# Patient Record
Sex: Female | Born: 1950 | Race: Black or African American | Hispanic: No | Smoking: Never smoker
Health system: Southern US, Community
[De-identification: ages and names within clinical notes are randomized; demographics above are authoritative.]

## PROBLEM LIST (undated history)

## (undated) DIAGNOSIS — I5032 Chronic diastolic (congestive) heart failure: Secondary | ICD-10-CM

## (undated) DIAGNOSIS — T7840XA Allergy, unspecified, initial encounter: Secondary | ICD-10-CM

## (undated) DIAGNOSIS — F32A Depression, unspecified: Secondary | ICD-10-CM

## (undated) DIAGNOSIS — I2489 Other forms of acute ischemic heart disease: Secondary | ICD-10-CM

## (undated) DIAGNOSIS — N183 Chronic kidney disease, stage 3 unspecified: Secondary | ICD-10-CM

## (undated) DIAGNOSIS — K219 Gastro-esophageal reflux disease without esophagitis: Secondary | ICD-10-CM

## (undated) DIAGNOSIS — I3139 Other pericardial effusion (noninflammatory): Secondary | ICD-10-CM

## (undated) DIAGNOSIS — R072 Precordial pain: Secondary | ICD-10-CM

## (undated) DIAGNOSIS — E119 Type 2 diabetes mellitus without complications: Secondary | ICD-10-CM

## (undated) DIAGNOSIS — Z8614 Personal history of Methicillin resistant Staphylococcus aureus infection: Secondary | ICD-10-CM

## (undated) DIAGNOSIS — N186 End stage renal disease: Secondary | ICD-10-CM

## (undated) DIAGNOSIS — R0902 Hypoxemia: Secondary | ICD-10-CM

## (undated) DIAGNOSIS — M1990 Unspecified osteoarthritis, unspecified site: Secondary | ICD-10-CM

## (undated) DIAGNOSIS — Z9289 Personal history of other medical treatment: Secondary | ICD-10-CM

## (undated) DIAGNOSIS — F419 Anxiety disorder, unspecified: Secondary | ICD-10-CM

## (undated) DIAGNOSIS — D649 Anemia, unspecified: Secondary | ICD-10-CM

## (undated) DIAGNOSIS — D329 Benign neoplasm of meninges, unspecified: Secondary | ICD-10-CM

## (undated) DIAGNOSIS — F329 Major depressive disorder, single episode, unspecified: Secondary | ICD-10-CM

## (undated) DIAGNOSIS — I10 Essential (primary) hypertension: Secondary | ICD-10-CM

## (undated) DIAGNOSIS — I248 Other forms of acute ischemic heart disease: Secondary | ICD-10-CM

## (undated) HISTORY — DX: Chronic diastolic (congestive) heart failure: I50.32

## (undated) HISTORY — DX: Precordial pain: R07.2

## (undated) HISTORY — DX: Allergy, unspecified, initial encounter: T78.40XA

## (undated) HISTORY — DX: Essential (primary) hypertension: I10

## (undated) HISTORY — DX: Hypoxemia: R09.02

## (undated) HISTORY — DX: Other forms of acute ischemic heart disease: I24.8

## (undated) HISTORY — DX: Other forms of acute ischemic heart disease: I24.89

## (undated) HISTORY — DX: Gastro-esophageal reflux disease without esophagitis: K21.9

## (undated) HISTORY — PX: JOINT REPLACEMENT: SHX530

## (undated) HISTORY — DX: Other pericardial effusion (noninflammatory): I31.39

## (undated) HISTORY — DX: Benign neoplasm of meninges, unspecified: D32.9

## (undated) HISTORY — DX: Anxiety disorder, unspecified: F41.9

## (undated) HISTORY — DX: Personal history of other medical treatment: Z92.89

## (undated) HISTORY — DX: End stage renal disease: N18.6

## (undated) HISTORY — PX: DILATION AND CURETTAGE OF UTERUS: SHX78

## (undated) HISTORY — PX: COLONOSCOPY: SHX174

---

## 1979-06-05 HISTORY — PX: TUBAL LIGATION: SHX77

## 1994-06-04 HISTORY — PX: CERVICAL DISCECTOMY: SHX98

## 1998-08-25 ENCOUNTER — Encounter: Payer: Self-pay | Admitting: Internal Medicine

## 2001-11-13 ENCOUNTER — Encounter: Payer: Self-pay | Admitting: Internal Medicine

## 2001-11-13 ENCOUNTER — Other Ambulatory Visit: Admission: RE | Admit: 2001-11-13 | Discharge: 2001-11-13 | Payer: Self-pay | Admitting: Internal Medicine

## 2001-11-13 LAB — CONVERTED CEMR LAB: Pap Smear: NORMAL

## 2003-03-01 ENCOUNTER — Encounter: Payer: Self-pay | Admitting: Internal Medicine

## 2004-08-07 ENCOUNTER — Ambulatory Visit: Payer: Self-pay | Admitting: Internal Medicine

## 2004-10-05 ENCOUNTER — Ambulatory Visit: Payer: Self-pay | Admitting: Internal Medicine

## 2005-05-18 ENCOUNTER — Ambulatory Visit: Payer: Self-pay | Admitting: Internal Medicine

## 2005-06-04 HISTORY — PX: OTHER SURGICAL HISTORY: SHX169

## 2005-07-13 ENCOUNTER — Ambulatory Visit: Payer: Self-pay | Admitting: Internal Medicine

## 2005-08-06 ENCOUNTER — Ambulatory Visit: Payer: Self-pay | Admitting: Internal Medicine

## 2005-08-06 ENCOUNTER — Other Ambulatory Visit: Admission: RE | Admit: 2005-08-06 | Discharge: 2005-08-06 | Payer: Self-pay | Admitting: Internal Medicine

## 2005-09-06 ENCOUNTER — Ambulatory Visit: Payer: Self-pay | Admitting: Internal Medicine

## 2005-10-05 ENCOUNTER — Ambulatory Visit: Payer: Self-pay | Admitting: Internal Medicine

## 2006-02-28 ENCOUNTER — Ambulatory Visit: Payer: Self-pay | Admitting: Family Medicine

## 2006-04-27 ENCOUNTER — Other Ambulatory Visit: Payer: Self-pay

## 2006-04-27 ENCOUNTER — Emergency Department: Payer: Self-pay | Admitting: Emergency Medicine

## 2006-05-08 ENCOUNTER — Ambulatory Visit: Payer: Self-pay | Admitting: Internal Medicine

## 2006-05-14 ENCOUNTER — Ambulatory Visit: Payer: Self-pay | Admitting: Internal Medicine

## 2006-05-20 ENCOUNTER — Ambulatory Visit: Payer: Self-pay | Admitting: Internal Medicine

## 2006-05-20 ENCOUNTER — Encounter: Payer: Self-pay | Admitting: Internal Medicine

## 2006-06-04 DIAGNOSIS — Z9289 Personal history of other medical treatment: Secondary | ICD-10-CM

## 2006-06-04 DIAGNOSIS — Z8614 Personal history of Methicillin resistant Staphylococcus aureus infection: Secondary | ICD-10-CM

## 2006-06-04 HISTORY — DX: Personal history of Methicillin resistant Staphylococcus aureus infection: Z86.14

## 2006-06-04 HISTORY — DX: Personal history of other medical treatment: Z92.89

## 2006-06-21 ENCOUNTER — Encounter: Payer: Self-pay | Admitting: Internal Medicine

## 2006-07-18 ENCOUNTER — Ambulatory Visit: Payer: Self-pay | Admitting: Internal Medicine

## 2006-07-19 ENCOUNTER — Encounter: Payer: Self-pay | Admitting: Internal Medicine

## 2006-07-19 LAB — CONVERTED CEMR LAB
Bilirubin Urine: NEGATIVE
Hemoglobin, Urine: NEGATIVE
Ketones, ur: NEGATIVE mg/dL
Nitrite: NEGATIVE
Specific Gravity, Urine: 1.021 (ref 1.005–1.03)
Urine Glucose: NEGATIVE mg/dL
Urobilinogen, UA: 0.2 (ref 0.0–1.0)

## 2006-08-03 DIAGNOSIS — Z86018 Personal history of other benign neoplasm: Secondary | ICD-10-CM

## 2006-08-03 HISTORY — PX: BRAIN MENINGIOMA EXCISION: SHX576

## 2006-08-27 ENCOUNTER — Ambulatory Visit: Payer: Self-pay | Admitting: Physical Medicine & Rehabilitation

## 2006-08-27 ENCOUNTER — Inpatient Hospital Stay (HOSPITAL_COMMUNITY)
Admission: RE | Admit: 2006-08-27 | Discharge: 2006-09-10 | Payer: Self-pay | Admitting: Physical Medicine & Rehabilitation

## 2006-09-10 ENCOUNTER — Encounter: Payer: Self-pay | Admitting: Internal Medicine

## 2006-09-26 ENCOUNTER — Encounter: Payer: Self-pay | Admitting: Internal Medicine

## 2006-09-26 DIAGNOSIS — E7800 Pure hypercholesterolemia, unspecified: Secondary | ICD-10-CM

## 2006-09-26 DIAGNOSIS — I10 Essential (primary) hypertension: Secondary | ICD-10-CM | POA: Insufficient documentation

## 2006-09-26 DIAGNOSIS — J309 Allergic rhinitis, unspecified: Secondary | ICD-10-CM | POA: Insufficient documentation

## 2006-09-27 DIAGNOSIS — G43009 Migraine without aura, not intractable, without status migrainosus: Secondary | ICD-10-CM | POA: Insufficient documentation

## 2006-09-30 ENCOUNTER — Ambulatory Visit: Payer: Self-pay | Admitting: Internal Medicine

## 2006-10-01 ENCOUNTER — Encounter: Payer: Self-pay | Admitting: Internal Medicine

## 2006-10-04 ENCOUNTER — Encounter: Payer: Self-pay | Admitting: Internal Medicine

## 2006-12-30 ENCOUNTER — Ambulatory Visit: Payer: Self-pay | Admitting: Internal Medicine

## 2006-12-31 ENCOUNTER — Encounter: Payer: Self-pay | Admitting: Internal Medicine

## 2007-01-06 ENCOUNTER — Telehealth (INDEPENDENT_AMBULATORY_CARE_PROVIDER_SITE_OTHER): Payer: Self-pay | Admitting: *Deleted

## 2007-01-09 ENCOUNTER — Encounter: Payer: Self-pay | Admitting: Internal Medicine

## 2007-01-17 ENCOUNTER — Telehealth (INDEPENDENT_AMBULATORY_CARE_PROVIDER_SITE_OTHER): Payer: Self-pay | Admitting: *Deleted

## 2007-04-11 ENCOUNTER — Ambulatory Visit: Payer: Self-pay | Admitting: Internal Medicine

## 2007-04-11 DIAGNOSIS — R0609 Other forms of dyspnea: Secondary | ICD-10-CM | POA: Insufficient documentation

## 2007-04-11 DIAGNOSIS — R0989 Other specified symptoms and signs involving the circulatory and respiratory systems: Secondary | ICD-10-CM

## 2007-04-30 ENCOUNTER — Encounter (INDEPENDENT_AMBULATORY_CARE_PROVIDER_SITE_OTHER): Payer: Self-pay | Admitting: *Deleted

## 2007-05-06 ENCOUNTER — Ambulatory Visit: Payer: Self-pay | Admitting: Internal Medicine

## 2007-05-06 LAB — CONVERTED CEMR LAB
BUN: 11 mg/dL (ref 6–23)
CO2: 28 meq/L (ref 19–32)
Calcium: 9.5 mg/dL (ref 8.4–10.5)
Chloride: 103 meq/L (ref 96–112)
Creatinine, Ser: 0.8 mg/dL (ref 0.4–1.2)
GFR calc Af Amer: 95 mL/min
Potassium: 4 meq/L (ref 3.5–5.1)
Sodium: 142 meq/L (ref 135–145)

## 2007-05-07 ENCOUNTER — Encounter: Payer: Self-pay | Admitting: Internal Medicine

## 2007-05-13 ENCOUNTER — Encounter: Admission: RE | Admit: 2007-05-13 | Discharge: 2007-05-13 | Payer: Self-pay | Admitting: Neurosurgery

## 2007-06-18 ENCOUNTER — Ambulatory Visit: Payer: Self-pay | Admitting: Cardiology

## 2007-06-27 ENCOUNTER — Ambulatory Visit: Payer: Self-pay

## 2007-06-27 ENCOUNTER — Encounter: Payer: Self-pay | Admitting: Internal Medicine

## 2007-06-27 ENCOUNTER — Encounter: Payer: Self-pay | Admitting: Cardiology

## 2007-06-27 LAB — CONVERTED CEMR LAB
CO2: 30 meq/L (ref 19–32)
Calcium: 9.2 mg/dL (ref 8.4–10.5)
Creatinine, Ser: 1.8 mg/dL — ABNORMAL HIGH (ref 0.4–1.2)
TSH: 4.5 microintl units/mL (ref 0.35–5.50)

## 2007-08-18 ENCOUNTER — Ambulatory Visit: Payer: Self-pay | Admitting: Internal Medicine

## 2007-08-19 LAB — CONVERTED CEMR LAB
Albumin: 3.9 g/dL (ref 3.5–5.2)
BUN: 15 mg/dL (ref 6–23)
CO2: 32 meq/L (ref 19–32)
Calcium: 9.6 mg/dL (ref 8.4–10.5)
Creatinine, Ser: 0.9 mg/dL (ref 0.4–1.2)
Phosphorus: 4.5 mg/dL (ref 2.3–4.6)
Potassium: 3.9 meq/L (ref 3.5–5.1)
Sodium: 141 meq/L (ref 135–145)

## 2007-11-10 ENCOUNTER — Ambulatory Visit: Payer: Self-pay | Admitting: Internal Medicine

## 2008-02-27 ENCOUNTER — Ambulatory Visit: Payer: Self-pay | Admitting: Internal Medicine

## 2008-05-03 ENCOUNTER — Telehealth (INDEPENDENT_AMBULATORY_CARE_PROVIDER_SITE_OTHER): Payer: Self-pay | Admitting: *Deleted

## 2008-05-04 ENCOUNTER — Telehealth: Payer: Self-pay | Admitting: Internal Medicine

## 2008-07-16 ENCOUNTER — Telehealth: Payer: Self-pay | Admitting: Internal Medicine

## 2008-07-19 ENCOUNTER — Encounter: Payer: Self-pay | Admitting: Internal Medicine

## 2008-07-21 ENCOUNTER — Encounter: Payer: Self-pay | Admitting: Internal Medicine

## 2008-07-22 ENCOUNTER — Encounter: Payer: Self-pay | Admitting: Internal Medicine

## 2008-08-27 ENCOUNTER — Ambulatory Visit: Payer: Self-pay | Admitting: Internal Medicine

## 2008-08-30 LAB — CONVERTED CEMR LAB
ALT: 16 units/L (ref 0–35)
AST: 17 units/L (ref 0–37)
Albumin: 3.9 g/dL (ref 3.5–5.2)
Basophils Relative: 0.3 % (ref 0.0–3.0)
Calcium: 9.2 mg/dL (ref 8.4–10.5)
Cholesterol: 259 mg/dL — ABNORMAL HIGH (ref 0–200)
Creatinine, Ser: 0.6 mg/dL (ref 0.4–1.2)
Direct LDL: 189.4 mg/dL
Eosinophils Relative: 0.9 % (ref 0.0–5.0)
GFR calc non Af Amer: 131.39 mL/min (ref >60)
Glucose, Bld: 135 mg/dL — ABNORMAL HIGH (ref 70–99)
Lymphocytes Relative: 30.2 % (ref 12.0–46.0)
MCHC: 33.9 g/dL (ref 30.0–36.0)
Monocytes Absolute: 0.6 10*3/uL (ref 0.1–1.0)
Monocytes Relative: 6.6 % (ref 3.0–12.0)
Neutrophils Relative %: 62 % (ref 43.0–77.0)
Platelets: 290 10*3/uL (ref 150.0–400.0)
Potassium: 3.7 meq/L (ref 3.5–5.1)
RBC: 4.68 M/uL (ref 3.87–5.11)
Sodium: 143 meq/L (ref 135–145)
TSH: 1.22 microintl units/mL (ref 0.35–5.50)

## 2009-01-10 ENCOUNTER — Ambulatory Visit: Payer: Self-pay | Admitting: Internal Medicine

## 2009-03-07 ENCOUNTER — Ambulatory Visit: Payer: Self-pay | Admitting: Internal Medicine

## 2009-03-19 ENCOUNTER — Emergency Department: Payer: Self-pay | Admitting: Emergency Medicine

## 2009-03-29 ENCOUNTER — Ambulatory Visit: Payer: Self-pay | Admitting: Internal Medicine

## 2009-03-29 DIAGNOSIS — S2090XA Unspecified superficial injury of unspecified parts of thorax, initial encounter: Secondary | ICD-10-CM

## 2009-06-04 DIAGNOSIS — E119 Type 2 diabetes mellitus without complications: Secondary | ICD-10-CM

## 2009-06-04 HISTORY — DX: Type 2 diabetes mellitus without complications: E11.9

## 2009-07-15 ENCOUNTER — Ambulatory Visit: Payer: Self-pay | Admitting: Internal Medicine

## 2009-07-15 ENCOUNTER — Other Ambulatory Visit: Admission: RE | Admit: 2009-07-15 | Discharge: 2009-07-15 | Payer: Self-pay | Admitting: Internal Medicine

## 2009-07-16 LAB — CONVERTED CEMR LAB
Alkaline Phosphatase: 95 units/L (ref 39–117)
BUN: 15 mg/dL (ref 6–23)
Basophils Absolute: 0.1 10*3/uL (ref 0.0–0.1)
CO2: 29 meq/L (ref 19–32)
Calcium: 9.2 mg/dL (ref 8.4–10.5)
Chloride: 98 meq/L (ref 96–112)
Cholesterol: 259 mg/dL — ABNORMAL HIGH (ref 0–200)
Creatinine, Ser: 0.8 mg/dL (ref 0.4–1.2)
Direct LDL: 197 mg/dL
GFR calc non Af Amer: 93.99 mL/min (ref >60)
Glucose, Bld: 149 mg/dL — ABNORMAL HIGH (ref 70–99)
Monocytes Absolute: 0.5 10*3/uL (ref 0.1–1.0)
Monocytes Relative: 6.2 % (ref 3.0–12.0)
Neutro Abs: 4.6 10*3/uL (ref 1.4–7.7)
Phosphorus: 3.3 mg/dL (ref 2.3–4.6)
Potassium: 3.3 meq/L — ABNORMAL LOW (ref 3.5–5.1)
Sodium: 138 meq/L (ref 135–145)
TSH: 1.35 microintl units/mL (ref 0.35–5.50)
Triglycerides: 116 mg/dL (ref 0.0–149.0)
WBC: 7.5 10*3/uL (ref 4.5–10.5)

## 2009-07-19 LAB — CONVERTED CEMR LAB: Pap Smear: NEGATIVE

## 2009-08-10 ENCOUNTER — Encounter: Payer: Self-pay | Admitting: Internal Medicine

## 2009-08-10 ENCOUNTER — Ambulatory Visit: Payer: Self-pay | Admitting: Internal Medicine

## 2009-08-11 ENCOUNTER — Ambulatory Visit: Payer: Self-pay | Admitting: Internal Medicine

## 2009-08-11 ENCOUNTER — Telehealth: Payer: Self-pay | Admitting: Internal Medicine

## 2009-08-12 ENCOUNTER — Ambulatory Visit: Payer: Self-pay | Admitting: Family Medicine

## 2009-08-12 ENCOUNTER — Encounter: Payer: Self-pay | Admitting: Internal Medicine

## 2009-08-12 DIAGNOSIS — N950 Postmenopausal bleeding: Secondary | ICD-10-CM

## 2009-08-12 LAB — HM MAMMOGRAPHY: HM Mammogram: NORMAL

## 2009-08-16 ENCOUNTER — Encounter (INDEPENDENT_AMBULATORY_CARE_PROVIDER_SITE_OTHER): Payer: Self-pay | Admitting: *Deleted

## 2009-08-22 ENCOUNTER — Ambulatory Visit: Payer: Self-pay | Admitting: Internal Medicine

## 2009-08-22 DIAGNOSIS — R7309 Other abnormal glucose: Secondary | ICD-10-CM | POA: Insufficient documentation

## 2009-08-23 ENCOUNTER — Telehealth: Payer: Self-pay | Admitting: Internal Medicine

## 2009-08-23 LAB — CONVERTED CEMR LAB
ALT: 17 units/L (ref 0–35)
Albumin: 3.9 g/dL (ref 3.5–5.2)
BUN: 17 mg/dL (ref 6–23)
Bilirubin, Direct: 0 mg/dL (ref 0.0–0.3)
CO2: 32 meq/L (ref 19–32)
Calcium: 9.1 mg/dL (ref 8.4–10.5)
Chloride: 104 meq/L (ref 96–112)
Cholesterol: 194 mg/dL (ref 0–200)
LDL Cholesterol: 123 mg/dL — ABNORMAL HIGH (ref 0–99)
Phosphorus: 3.4 mg/dL (ref 2.3–4.6)
Potassium: 4.3 meq/L (ref 3.5–5.1)
Sodium: 142 meq/L (ref 135–145)
Total CHOL/HDL Ratio: 4
Total Protein: 8.2 g/dL (ref 6.0–8.3)

## 2009-09-06 ENCOUNTER — Ambulatory Visit: Payer: Self-pay

## 2009-09-12 ENCOUNTER — Ambulatory Visit: Payer: Self-pay

## 2009-09-14 ENCOUNTER — Encounter: Payer: Self-pay | Admitting: Internal Medicine

## 2009-09-14 ENCOUNTER — Telehealth (INDEPENDENT_AMBULATORY_CARE_PROVIDER_SITE_OTHER): Payer: Self-pay | Admitting: *Deleted

## 2009-09-14 ENCOUNTER — Ambulatory Visit: Payer: Self-pay | Admitting: Cardiology

## 2009-09-22 ENCOUNTER — Ambulatory Visit: Payer: Self-pay

## 2009-10-14 ENCOUNTER — Telehealth: Payer: Self-pay | Admitting: Internal Medicine

## 2009-10-24 ENCOUNTER — Telehealth: Payer: Self-pay | Admitting: Internal Medicine

## 2010-02-22 ENCOUNTER — Ambulatory Visit: Payer: Self-pay | Admitting: Internal Medicine

## 2010-03-20 ENCOUNTER — Ambulatory Visit: Payer: Self-pay | Admitting: Internal Medicine

## 2010-03-20 DIAGNOSIS — J019 Acute sinusitis, unspecified: Secondary | ICD-10-CM

## 2010-03-20 DIAGNOSIS — H6090 Unspecified otitis externa, unspecified ear: Secondary | ICD-10-CM | POA: Insufficient documentation

## 2010-03-20 DIAGNOSIS — H60339 Swimmer's ear, unspecified ear: Secondary | ICD-10-CM

## 2010-03-21 ENCOUNTER — Telehealth: Payer: Self-pay | Admitting: Family Medicine

## 2010-07-02 LAB — CONVERTED CEMR LAB
Albumin: 3.7 g/dL (ref 3.5–5.2)
BUN: 12 mg/dL (ref 6–23)
Calcium: 9.4 mg/dL (ref 8.4–10.5)
Chloride: 102 meq/L (ref 96–112)
Creatinine, Ser: 0.6 mg/dL (ref 0.4–1.2)
Eosinophils Absolute: 0 10*3/uL (ref 0.0–0.6)
Glucose, Bld: 100 mg/dL — ABNORMAL HIGH (ref 70–99)
HCT: 38 % (ref 36.0–46.0)
MCHC: 33.5 g/dL (ref 30.0–36.0)
MCV: 84.2 fL (ref 78.0–100.0)
Microalb, Ur: 2 mg/dL — ABNORMAL HIGH (ref 0.0–1.9)
Monocytes Absolute: 0.5 10*3/uL (ref 0.2–0.7)
Monocytes Relative: 8.5 % (ref 3.0–11.0)
Neutrophils Relative %: 56.4 % (ref 43.0–77.0)
Phosphorus: 4 mg/dL (ref 2.3–4.6)
Potassium: 3.7 meq/L (ref 3.5–5.1)
Prothrombin Time: 12 s (ref 10.0–14.0)
RDW: 14.9 % — ABNORMAL HIGH (ref 11.5–14.6)
Sed Rate: 38 mm/hr — ABNORMAL HIGH (ref 0–25)

## 2010-07-06 NOTE — Letter (Signed)
Summary: Surgical Clearance/[facility]   Imported By: Edmonia James 09/29/2009 13:25:11  _____________________________________________________________________  External Attachment:    Type:   Image     Comment:   External Document

## 2010-07-06 NOTE — Assessment & Plan Note (Signed)
Summary: [name]   Nurse Visit   Allergies: 1)  Septra 2)  Aleve (Naproxen Sodium)  Orders Added: 1)  Flu Vaccine 26yrs + MEDICARE PATIENTS [Q2039] 2)  Administration Flu vaccine - MCR [id number]                      Flu Vaccine Consent Questions     Do you have a history of severe allergic reactions to this vaccine? no    Any prior history of allergic reactions to egg and/or gelatin? no    Do you have a sensitivity to the preservative Thimersol? no    Do you have a past history of Guillan-Barre Syndrome? no    Do you currently have an acute febrile illness? no    Have you ever had a severe reaction to latex? no    Vaccine information given and explained to patient? yes    Are you currently pregnant? no    Lot Number:[id number]   Exp Date:12/02/2010   Manufacturer: [employer]    Site Given  Left Deltoid IMlu

## 2010-07-06 NOTE — Assessment & Plan Note (Signed)
Summary: CONGESTON/DLO   Vital Signs:  Patient profile:   60 year old female Weight:      306.50 pounds Temp:     98.9 degrees F oral Pulse rate:   88 / minute Pulse rhythm:   regular BP sitting:   140 / 80  (left arm) Cuff size:   large  Vitals Entered By: Maudie Mercury Dance CMA Deborra Medina) (March 20, 2010 3:21 PM) CC: Bilat. ear pain and sore throat   History of Present Illness: CC: earache, ST  1 1/2 wk h/o earach L>R, scratchy throat, more hoarse.  mild dry cough.  + clear mucous rhinorrhea.  + tooth pain/pressure.  + mild ringing last night R ear.    No change in hearing or discharge/draining from ears.  No fevers/chills, abd pain, n/v/d, HA.  No recent swimming.    + husband with sinus infection last month.  + grandchildren sick a few weeks ago.  s/p flu shot.  Current Medications (verified): 1)  Keppra 500 Mg Tabs (Levetiracetam) .... Take 2 By Mouth Two Times A Day 2)  Norvasc 10 Mg Tabs (Amlodipine Besylate) .... Take One By Mouth Once Daily 3)  Multivitamins  Tabs (Multiple Vitamin) .... Take One By Mouth Once Daily 4)  Ibuprofen 600 Mg Tabs (Ibuprofen) .... Take One By Mouth Two Times A Day 5)  Pravastatin Sodium 40 Mg Tabs (Pravastatin Sodium) .[name].. 1 Tab Daily For High Cholesterol 6)  Triamterene-Hctz 75-50 Mg Tabs (Triamterene-Hctz) .... 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  morbidly obese Head:  Normocephalic and atraumatic without obvious abnormalities. No apparent alopecia or balding.  + sinus  tenderness to palpation throughout mainly L frontal Eyes:  pupils equal, pupils round, pupils reactive to light Ears:  L external ear canal with some inflammation/swelling, difficult to evaluate TM but seen and dull, slight erythema. R TM - dull, fluid, good mobility with insufflation Nose:  no external deformity.  swollen turbinates Mouth:  + erythema pharynx/telangiectasia Neck:  supple, no masses, no thyromegaly, no carotid bruits, and no cervical lymphadenopathy.   Lungs:  normal respiratory effort.   Heart:  normal rate, regular rhythm, no murmur, and no gallop.   Pulses:  2+ rad pulses, good skin turgor Extremities:  no edema   Impression & Recommendations:  Problem # 1:  SINUSITIS, ACUTE (ICD-461.9) treat for early sinusitis with abx.  red flags to return discussed.    Her updated medication list for this problem includes:    Amoxicillin 875 Mg Tabs (Amoxicillin) ..... One by mouth two times a day x 10 days  Problem # 2:  OTITIS EXTERNA, ACUTE (ICD-380.12) given significant swelling on exam today, treat with ear drops x 5 days.  RTC if not improving.  no drainage.  visualized TM, no perf.  Her updated medication list for this problem includes:    Cipro Hc 0.2-1 % Susp (Ciprofloxacin-hydrocortisone) ..... One drop in affected ear two times a day x 5 days  Complete Medication List: 1)  Keppra 500 Mg Tabs (Levetiracetam) .... Take 2 by  mouth two times a day 2)  Norvasc 10 Mg Tabs (Amlodipine besylate) .... Take one by mouth once daily 3)  Multivitamins Tabs (Multiple vitamin) .... Take one by mouth once daily 4)  Ibuprofen 600 Mg Tabs (Ibuprofen) .... Take one by mouth two times a day 5)  Pravastatin Sodium 40 Mg Tabs (Pravastatin sodium) .[name].. 1 tab daily for high cholesterol 6)  Triamterene-hctz 75-50 Mg Tabs (Triamterene-hctz) .... 1/2 tab daily for high blood pressure 7)  Amoxicillin 875 Mg Tabs (Amoxicillin) .... One by mouth two times a day x 10 days 8)  Cipro Hc 0.2-1 %  Susp (Ciprofloxacin-hydrocortisone) .... One drop in affected ear two times a day x 5 days  Patient Instructions: 1)  You may have early sinus infection. Treat with course of antibiotics for next 10 days.  Ear drops for external infection for next few days as well. 2)  Tylenol for pain. 3)  Please return if not improving as expected, if high fevers >101.5, or other concerns.   4)  Push fluids and get plenty of rest to help speed recovery. 5)  Pleasure to see you otday, call clinic with questions. Prescriptions: CIPRO HC 0.2-1 % SUSP (CIPROFLOXACIN-HYDROCORTISONE) one drop in affected ear two times a day x 5 days  #1 x 0   Entered and Authorized by:   Ria Bush  MD   Signed by:   Ria Bush  MD on 03/20/2010   Method used:   Electronically to        [facility] (retail)       [place], [place]  [address]       Ph: [phone]       Fax: [phone]   RxID:   [id number] AMOXICILLIN 875 MG TABS (AMOXICILLIN) one by mouth two times a day x 10 days  #20 x 0   Entered and Authorized by:   Ria Bush  MD   Signed by:   Ria Bush  MD on 03/20/2010   Method used:   Electronically to        [facility] (retail)       [place], [place]  [address]       Ph: [phone]       Fax: [phone]   RxID:   [id number]    Orders Added: 1)  Est. Patient Level III [id number]    Current Allergies (reviewed today): SEPTRA ALEVE (NAPROXEN SODIUM)

## 2010-07-06 NOTE — Assessment & Plan Note (Signed)
Summary: Abnormal vaginal bleeding   Vital Signs:  Patient profile:   60 year old female Height:      65 inches Weight:      317.50 pounds BMI:     53.03 Temp:     97.3 degrees F oral Pulse rate:   76 / minute Pulse rhythm:   regular BP sitting:   136 / 72  (left arm) Cuff size:   large  Vitals Entered By: Sherrian Divers CMA Deborra Medina) (August 12, 2009 11:19 AM) CC: abnormal vaginal bleeding   History of Present Illness: 60 year old female:  Came in a couple of weeks ago and had some pap smear and then Tuesday Monday, and bled and was bleeding like she was having a cycle. Has been bleeding ever since. She is not had periods in greater than 10 years.  No prior vaginal bleeding  in this time.  She has no known history of gynecological cancers, she's never had any  gynecological surgery.. She retains her uterus and ovaries.   Dr. Laurey Morale delivered her children.   Allergies: 1)  Septra (Sulfamethoxazole-Trimethoprim) 2)  Aleve (Naproxen Sodium)  Past History:  Past medical, surgical, family and social histories (including risk factors) reviewed, and no changes noted (except as noted below).  Past Medical History: Reviewed history from 08/27/2008 and no changes required. Allergic rhinitis Hypertension Foramen magnum meningioma  Past Surgical History: Reviewed history from 09/26/2006 and no changes required. Cervical discectomy (Kritzer) 1996 C-section 1981 Right wrist x-ray- deg. at 1st. 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 HTN is strong in family  Social History: Reviewed history from 08/18/2007 and no changes required. 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 GI: No n/v/d Pulm: No SOB, cough, wheezing Interactive and getting along well at home.  Otherwise, ROS is as per the HPI.   Physical Exam  General:  alert and normal appearance.  morbidly obese Head:  Normocephalic and atraumatic without obvious abnormalities. No apparent alopecia or balding. Ears:  no external deformities.   Nose:  no external deformity.   Lungs:  normal respiratory effort.   Abdomen:  soft, non-tender, normal bowel sounds, no distention, no masses, no guarding, no rigidity, and no rebound tenderness.   Genitalia:  normal introitus, no external lesions, and no vaginal or cervical lesions.   there is blood and clot in the vaginal vault and at the  cervical os Neurologic:  alert & oriented X3 and gait normal.   Inguinal Nodes:  No significant adenopathy Psych:  Cognition and judgment appear intact. Alert and cooperative with normal attention span and concentration. No apparent delusions, illusions, hallucinations   Impression & Recommendations:  Problem # 1:  POSTMENOPAUSAL BLEEDING (ICD-627.1) Assessment New postmenopausal bleeding, 60 years old, obligated for definitive  evaluation..  Consult Dr. Laurey Morale, the patient's gynecologist  Orders: Gynecologic Referral (Gyn)  Complete Medication List: 1)  Keppra 500 Mg Tabs (Levetiracetam) .... Take 2 by mouth two times a day 2)  Norvasc 10 Mg Tabs (Amlodipine besylate) .... Take one by mouth once daily 3)  Multivitamins Tabs (Multiple vitamin) .... Take one by mouth once daily 4)  Ibuprofen 600 Mg Tabs (Ibuprofen) .... Take one  by mouth two times a day 5)  Triamterene-hctz 37.5-25 Mg Tabs (Triamterene-hctz) .[name].. 1 tab daily for high blood pressure 6)  Pravastatin Sodium 40 Mg Tabs (Pravastatin sodium) .[name].. 1 tab daily for high cholesterol  Patient Instructions: 1)  Referral Appointment Information 2)  Day/Date: 3)  Time: 4)  Place/MD: 5)  Address: 6)  Phone/Fax: 7)  Patient given  appointment information. Information/Orders faxed/mailed.   Current Allergies (reviewed today): SEPTRA (SULFAMETHOXAZOLE-TRIMETHOPRIM) ALEVE (NAPROXEN SODIUM)

## 2010-07-06 NOTE — Progress Notes (Signed)
  Phone Note Other Incoming   Caller: [facility] Summary of Call: Patient is being charged the 5.27 for not returning the ifob stool kit. This note is also to inform you that the test has been canceled.  Initial call taken by: Selinda Orion,  August 11, 2009 11:07 AM  Follow-up for Phone Call        noted Follow-up by: [name] MD,  August 11, 2009 1:23 PM

## 2010-07-06 NOTE — Progress Notes (Signed)
Summary: Maxzide  Phone Note From Pharmacy   Caller: [facility].* Call For: Dr. Silvio Pate  Summary of Call: This looks like you may have addressed this on May 13th but I received the fax again today and I want to be sure of your instructions.  The fax states that the Maxzide 25 or 50 mg. is no longer available.  I read your comment from the last phone note that the patient had been advised to take 1/2 of the double strength but I do not see a record of that Rx.  Please advise. Initial call taken by: Christena Deem CMA Deborra Medina),  Oct 24, 2009 9:51 AM  Follow-up for Phone Call        the order was sent Not sure whether they had this or not Please check into this Follow-up by: [name] MD,  Oct 24, 2009 10:03 AM  Additional Follow-up for Phone Call Additional follow up Details #1::        spoke with pharmacist, they did receive rx and will take refill out of que. Additional Follow-up by: Edwin Dada CMA Deborra Medina),  Oct 24, 2009 11:17 AM

## 2010-07-06 NOTE — Letter (Signed)
Summary: [facility]: Immunoassay Fecal Occult Blood (iFOB) Order Form  [place] at [facility]  [address] [place], [place] [address]   Phone: [phone]  Fax: [phone]      [facility]: Immunoassay Fecal Occult Blood (iFOB) Order Form   July 15, 2009 MRN: [id number]   Carrie [name] 1950/10/02   Physicican Name:______Letvak___________________  Diagnosis Code:_______V76.11___________________      [name] MD

## 2010-07-06 NOTE — Letter (Signed)
Summary: Results Follow up Letter  [place] at [facility]  [address] [place], [place] [address]   Phone: [phone]  Fax: [phone]    08/12/2009 MRN: [id number]  Carrie [name] [place],   [address]  Dear Ms. [name],  The following are the results of your recent test(s):  Test         Result    Pap Smear:        Normal _____  Not Normal _____ Comments: ______________________________________________________ Cholesterol: LDL(Bad cholesterol):         Your goal is less than:         HDL (Good cholesterol):       Your goal is more than: Comments:  ______________________________________________________ Mammogram:        Normal __X___  Not Normal _____ Comments: Repeat in 1-2 years  ___________________________________________________________________ Hemoccult:        Normal _____  Not normal _______ Comments:    _____________________________________________________________________ Other Tests:    We routinely do not discuss normal results over the telephone.  If you desire a copy of the results, or you have any questions about this information we can discuss them at your next office visit.   Sincerely,     Viviana Simpler, MD

## 2010-07-06 NOTE — Letter (Signed)
Summary: [facility] No Show Letter  [place] at [facility]  [address] [place], [place] [address]   Phone: [phone]  Fax: [phone]    08/16/2009 MRN: [id number]  Carrie [name] [place], [place]  [address]   Dear Ms. [name],   [facility] records indicate that you missed your scheduled appointment with _____lab________________ on ___3.15.11_________.  Please contact this office to reschedule your appointment as soon as possible.  It is important that you keep your scheduled appointments with your physician, so we can provide you the best care possible.  Please be advised that there may be a charge for "no show" appointments.    Sincerely,   [place] at [facility]

## 2010-07-06 NOTE — Assessment & Plan Note (Signed)
Summary: CPX/RBH   Vital Signs:  Patient profile:   60 year old female Weight:      313 pounds BMI:     52.27 Temp:     98.2 degrees F oral Pulse rate:   88 / minute Pulse rhythm:   regular BP sitting:   140 / 88  (left arm) Cuff size:   large  Vitals Entered By: Edwin Dada CMA Deborra Medina) (July 15, 2009 9:42 AM) CC: adult physical   History of Present Illness: Here for physical No concerns for her  3 years since her foramen magnum tumor Surgeon moved to [place] but has deferred follow up to local neurosurgeon recent MRI fine    Allergies: 1)  Septra (Sulfamethoxazole-Trimethoprim) 2)  Aleve (Naproxen Sodium)  Past History:  Past medical, surgical, family and social histories (including risk factors) reviewed for relevance to current acute and chronic problems.  Past Medical History: Reviewed history from 08/27/2008 and no changes required. Allergic rhinitis Hypertension Foramen magnum meningioma  Past Surgical History: Reviewed history from 09/26/2006 and no changes required. Cervical discectomy (Kritzer) 1996 C-section 1981 Right wrist x-ray- deg. at 1st. 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 HTN is strong in family  Social History: Reviewed history from 08/18/2007 and no changes required. 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 Eyes:  Denies double vision and vision loss-1 eye. ENT:  Complains of ringing in ears; denies decreased hearing; intermittent ringing teeth okay--overdue for dentist. CV:  Complains of shortness of breath with exertion; denies chest pain or discomfort,  difficulty breathing at night, difficulty breathing while lying down, fainting, lightheadness, and palpitations; stable DOE. Resp:  Denies cough and shortness of breath. GI:  Denies abdominal pain, bloody stools, change in bowel habits, dark tarry stools, indigestion, nausea, and vomiting. GU:  Complains of nocturia; denies dysuria and incontinence; no sexual problems. MS:  Complains of joint pain; denies joint swelling; chronic knee pain. Derm:  Denies lesion(s) and rash. Neuro:  Complains of disturbances in coordination and poor balance; denies headaches, numbness, tingling, and weakness; stable walking difficulty. Psych:  Denies anxiety and depression. Heme:  Denies abnormal bruising and enlarge lymph nodes. Allergy:  Complains of seasonal allergies and sneezing; no meds.  Physical Exam  General:  alert and normal appearance.   Eyes:  pupils equal, pupils round, pupils reactive to light, and no optic disk abnormalities.   Ears:  R ear normal and L ear normal.   Mouth:  no erythema and no lesions.   Neck:  supple, no masses, no thyromegaly, no carotid bruits, and no cervical lymphadenopathy.   Breasts:  no masses, no abnormal thickening, no tenderness, and no adenopathy.   Lungs:  normal respiratory effort and normal breath sounds.   Heart:  normal rate, regular rhythm, no murmur, and no gallop.   Abdomen:  soft and non-tender.   Genitalia:  very limited trouble visualizing cervix--pap done limited bimanual without abnormality Msk:  no joint tenderness and no joint swelling.   Pulses:  normal in feet Extremities:  no edema Neurologic:  alert & oriented X3.   Needs assist getting on table Skin:  no rashes and no suspicious lesions.   Psych:  normally  interactive, good eye contact, not anxious appearing, and not depressed appearing.     Impression & Recommendations:  Problem # 1:  PREVENTIVE HEALTH CARE (ICD-V70.0) Assessment Comment Only will set up mammo decided to do stool  immunoassay pap done discussed fitness  Problem # 2:  HYPERTENSION (ICD-401.9) Assessment: Unchanged  reasonable control no changes for now  Her updated medication list for this problem includes:    Norvasc 10 Mg Tabs (Amlodipine besylate) .[name]... Take one by mouth once daily    Hydrochlorothiazide 25 Mg Tabs (Hydrochlorothiazide) .[name]... Take 1 tablet by mouth once a day  BP today: 140/88 Prior BP: 122/70 (03/29/2009)  Prior 10 Yr Risk Heart Disease: Not enough information (09/30/2006)  Labs Reviewed: K+: 3.7 (08/27/2008) Creat: : 0.6 (08/27/2008)   Chol: 259 (08/27/2008)   HDL: 38.90 (08/27/2008)   TG: 125.0 (08/27/2008)  Orders: TLB-Renal Function Panel (80069-RENAL) TLB-CBC Platelet - w/Differential (85025-CBCD) TLB-TSH (Thyroid Stimulating Hormone) (84443-TSH) Venipuncture [id number])  Problem # 3:  HYPERCHOLESTEROLEMIA (ICD-272.0) Assessment: Unchanged  will recheck consider meds if LDL still >130  Labs Reviewed: SGOT: 17 (08/27/2008)   SGPT: 16 (08/27/2008)  Prior 10 Yr Risk Heart Disease: Not enough information (09/30/2006)   HDL:38.90 (08/27/2008)  Chol:259 (08/27/2008)  Trig:125.0 (08/27/2008)  Orders: TLB-Lipid Panel (80061-LIPID) TLB-Hepatic/Liver Function Pnl (80076-HEPATIC)  Complete Medication List: 1)  Keppra 500 Mg Tabs (Levetiracetam) .... Take 2 by mouth two times a day 2)  Norvasc 10 Mg Tabs (Amlodipine besylate) .... Take one by mouth once daily 3)  Multivitamins Tabs (Multiple vitamin) .... Take one by mouth once daily 4)  Ibuprofen 600 Mg Tabs (Ibuprofen) .... Take one by mouth two times a day 5)  Hydrochlorothiazide 25 Mg Tabs (Hydrochlorothiazide) .... Take 1 tablet by mouth once a day  Other Orders: Radiology Referral (Radiology)  Patient Instructions: 1)  Please schedule a follow-up appointment in 6 months .  2)  Schedule your mammogram.  3)  Complete your hemoccult cards and return them soon.   Current Allergies (reviewed today): SEPTRA  (SULFAMETHOXAZOLE-TRIMETHOPRIM) ALEVE (NAPROXEN SODIUM)

## 2010-07-06 NOTE — Progress Notes (Signed)
Summary: Surgery  Phone Note Outgoing Call   Call placed by: Edwin Dada CMA Deborra Medina),  August 23, 2009 9:13 AM Call placed to: Patient Summary of Call: Called pt about her lab results and pt advised she will be having surgery on 09/08/2009. Pt was referred to Dr. Laurey Morale at [facility] and she will be having a D & C, pt also states she will go back on 08/31/2009 for pre-op, can we send copy of labs to [facility]? per pt? Initial call taken by: Edwin Dada CMA Deborra Medina),  August 23, 2009 9:17 AM  Follow-up for Phone Call        okay to send labs and my last note to Dr Laurey Morale  Tell her I wish her well Have her call with an update after her surgery Follow-up by: [name] MD,  August 23, 2009 9:30 AM  Additional Follow-up for Phone Call Additional follow up Details #1::        labs sent to Dr. Laurey Morale, pt states she will call.  Additional Follow-up by: Edwin Dada CMA Deborra Medina),  August 23, 2009 10:03 AM

## 2010-07-06 NOTE — Progress Notes (Signed)
Summary: Clearance for surgery  Phone Note From Other Clinic   Caller: [facility] Dr. Laurey Morale Call For: Dr. Silvio Pate Summary of Call: patient is scheduled to have a hysteroscopy D&C on 09/22/2009 and she needs surgical clearance. [facility] was told by patient that she just had a physical in Feb so they are faxing over the form to see if she needs to be seen? [facility] states there is a difference in her EKG from 2008 to 2011. Please advise. Form on your desk. Initial call taken by: Edwin Dada CMA Deborra Medina),  September 14, 2009 9:26 AM  Follow-up for Phone Call        EKG is not really different than 2008 form done that it should be okay to proceed Follow-up by: [name] MD,  September 14, 2009 12:24 PM  Additional Follow-up for Phone Call Additional follow up Details #1::        Form faxed to [facility]. Additional Follow-up by: Virgia Land,  September 14, 2009 1:12 PM

## 2010-07-06 NOTE — Progress Notes (Signed)
Summary: Request cheaper rx  Phone Note Call from Patient Call back at [phone]   Caller: Patient Call For: Dr.Cleaven Demario Summary of Call: Patient was in yesterday and given a rx for an ear drop. Patient states that the cost is $85.00 and that is with her insurance. Patient is requesting that you call her something cheaper in. Pharmacy-[facility]. Initial call taken by: Emelia Salisbury LPN,  October 18, 624THL 8:38 AM  Follow-up for Phone Call        changed.  please notify patient.  should be about $30, only necessary if still with ear pain. Follow-up by: Ria Bush  MD,  March 21, 2010 2:04 PM  Additional Follow-up for Phone Call Additional follow up Details #1::        Patient notified Additional Follow-up by: Arnetha Courser CMA Deborra Medina),  March 21, 2010 2:25 PM    New/Updated Medications: NEOMYCIN-POLYMYXIN-HC 3.5-10000-1 SUSP (NEOMYCIN-POLYMYXIN-HC) 4 drops into ear 3 times a day x 5 days Prescriptions: NEOMYCIN-POLYMYXIN-HC 3.5-10000-1 SUSP (NEOMYCIN-POLYMYXIN-HC) 4 drops into ear 3 times a day x 5 days  #1 x 0   Entered and Authorized by:   Ria Bush  MD   Signed by:   Ria Bush  MD on 03/21/2010   Method used:   Electronically to        [facility] (retail)       [place], [place]  [address]       Ph: [phone]       Fax: [phone]   RxID:   [id number]

## 2010-07-06 NOTE — Progress Notes (Signed)
Summary: TRIAMTERENE-HCTZ  Phone Note Refill Request Message from:  [facility] on Oct 14, 2009 11:04 AM  Refills Requested: Medication #1:  TRIAMTERENE-HCTZ 37.5-25 MG TABS 1 tab daily for high blood pressure Form on your desk stating that the strength of med is no longer made, please advise what this needs to be changed to.   Method Requested: Electronic Initial call taken by: Edwin Dada CMA Deborra Medina),  Oct 14, 2009 11:05 AM  Follow-up for Phone Call        let her know I sent double dose and she can just take 1/2  Follow-up by: [name] MD,  Oct 14, 2009 1:41 PM  Additional Follow-up for Phone Call Additional follow up Details #1::        Spoke with patient and advised results.  Additional Follow-up by: Edwin Dada CMA Deborra Medina),  Oct 14, 2009 3:25 PM    New/Updated Medications: TRIAMTERENE-HCTZ 75-50 MG TABS (TRIAMTERENE-HCTZ) 1/2 tab daily for high blood pressure Prescriptions: TRIAMTERENE-HCTZ 75-50 MG TABS (TRIAMTERENE-HCTZ) 1/2 tab daily for high blood pressure  #30 x 12   Entered and Authorized by:   [name] MD   Signed by:   [name] MD on 10/14/2009   Method used:   Electronically to        [facility] (retail)       [place], [place]  [id number]       Ph: [phone]       Fax: [phone]   RxID:   [id number]

## 2010-10-10 ENCOUNTER — Other Ambulatory Visit: Payer: Self-pay | Admitting: Internal Medicine

## 2010-10-17 NOTE — Assessment & Plan Note (Signed)
[facility]                            CARDIOLOGY OFFICE NOTE   NAME:Carrie [name], Carrie [name]                     MRN:          [id number]  DATE:06/18/2007                            DOB:          [birth year]    PRIMARY CARE PHYSICIAN:  Venia Carbon, MD   REASON FOR REFERRAL:  Shortness of breath.   CLINICAL HISTORY:  Carrie [name] is 60 years old and is known to me since  Carrie [name] husband is a patient of mine.  She has been having shortness of  breath over the past year and she recently saw Dr. Silvio Pate for evaluation  of this.  She says this dated back to after she had surgery for a tumor  and Carrie [name] cervical spine removed.  She has since that time she has gained  some weight and she had been less active, but now that she had become  more active she notes that she is quite short of breath.  She notes this  when she climbs stairs or walks up a hill.  She has no associated chest  pain and chest tightness and has had no palpitations.  Dr. Silvio Pate  thought this was probably related to weight gain and deconditioning, but  had some concern about this being related to cardiac etiology and  referred Carrie [name] here.   PAST MEDICAL HISTORY:  1. Is significant for hypertension.  2. She has had a borderline cholesterol.  3. She had no history of diabetes.  4. Carrie [name] past history is also history for Carrie [name] surgery at the base of Carrie [name]      skull which was performed in [place] at the recommendation of Dr.      Bernadene Person.  5. That she also has had a C-section.   CURRENT MEDICATIONS:  1. Keppra which was started after a surgery as prophylactic against      seizures.  2. She also on Norvasc for blood pressure.  3. Multivitamin.  4. Ibuprofen.   FAMILY HISTORY:  Is negative for heart disease or stroke.   REVIEW OF SYSTEMS:  Is positive for symptoms of fatigue and joint pains.   PHYSICAL EXAMINATION:  On examination today the blood pressure was  155/91, pulse 97 and regular.  There  was no venous tension.  Carotid pulses were full and no bruits.  The chest was clear without rales or rhonchi.  HEART:  Rhythm is regular.  No murmurs or gallops.  ABDOMEN:  Was soft with normal bowel sounds.  The abdomen was  protuberant.  I could feel no hepatosplenomegaly.  There was a 1+ edema bilaterally.  Pedal pulses were equal.   Electrocardiogram was normal.   ALLERGIES:  SEPTRA AND ALEVE.   IMPRESSION:  1. Shortness of breath of uncertain etiology.  2. Hypertension, not under optimal control.  3. Status post recent surgery for a tumor on the cervical spine and      base of the skull.   RECOMMENDATIONS:  I think the most probable explanation for Carrie [name]'s  shortness of breath is related to weight gain and deconditioning, but I  think we  should rule out cardiac etiology.  She has no smoking history  or lung history.  I will plan to get adenosine rest stress Myoview scan,  2D echocardiogram and chest x-ray.  Also get a CBC, BMP, BNP and TSH if  she has not had these done by Dr. Silvio Pate recently.  Will also start  hydrochlorothiazide 25 a day for Carrie [name] blood pressure and for lower  extremity edema, which I think is related to venous insufficiency.  I  will be in touch with Carrie [name] by phone after we have results of all of Carrie [name]  tests and will decide if she should have any further evaluation.     Bruce Alfonso Patten Olevia Perches, MD, [facility]  Electronically Signed    BRB/MedQ  DD: 06/18/2007  DT: 06/18/2007  Job #: [id number]

## 2010-10-20 NOTE — Discharge Summary (Signed)
NAME:  Carrie [name], Carrie [name] NO.:  [id number]   MEDICAL RECORD NO.:  [id number]          PATIENT TYPE:  IPS   LOCATION:  [id number]                         FACILITY:  [facility]   PHYSICIAN:  Jarvis Morgan, M.D.   DATE OF BIRTH:  21-Nov-1950   DATE OF ADMISSION:  08/27/2006  DATE OF DISCHARGE:  09/10/2006                               DISCHARGE SUMMARY   DISCHARGE DIAGNOSIS:  1. Foramina magnum meningioma resection.  2. Postop cerebrospinal fluid leak and methicillin-resistant      Staphylococcus aureus infection of neck treated.  3. Acute renal insufficiency resolving.  4. Candida urinary tract infection treated.  5. Hypokalemia resolved.  6. Hypertension.   HISTORY OF PRESENT ILLNESS:  Carrie [name] is a 60 year-old female with  history of hypertension and ACDF in past who developed recent neck pain  with numbness, bilateral upper extremity, around Thanksgiving of 2007.  Evaluation revealed large foramen magnum lesion with mass effect on mid  brain on right.  She was referred to Dr. Girtha Hake in [place] and  elected to undergo transcondylar course for microscopic resection of  extradural meningioma on February 21.  Postop with small right medullary  infarct and dysphasia.  The patient also developed mental status  changes; and fevers secondary to CSF leak and meningitis requiring  treatment with IV vancomycin, cefepime and Flagyl. ID was consulted for  input and recommended continuing antibiotics until CSF cultures  finalized.  CSF lumbar drain was placed on March 6.  Cultures from neck  wound positive for MRSA, CSF without any growth.  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  The patient  currently maintained on Keppra for seizure prophylaxis.  Has had issues  with dysphasia that have resolved;  currently on D-3 thin liquids.  Zosyn  was discontinued on March 22 with an ID recommending IV vancomycin  through March 31.  Currently patient with impairment in mobility and  self-care; and rehab consulted for further therapies.   PAST MEDICAL HISTORY:  Significant for hypertension in ACDF in 1996.   ALLERGIES:  SULFA.   FAMILY HISTORY:  Significant for cancer with brain metastases and heart  problems.   SOCIAL HISTORY:  The patient lives in [place] and works in a day  care.  Lives in a two-level home with 3-5 steps at entry.  She was  independent and working prior to admission.  Does not use any tobacco or  alcohol.   HOSPITAL COURSE:  Carrie [name] was admitted to rehab on August 27, 2006 for inpatient therapies to consist of PT, OT, and speech therapy.  Past admission she was maintained on DT diet thin liquids and was able  to tolerate this without difficulty.  Stat labs done past admission  secondary to issues regarding hypokalemia that was reported; and the  patient was started on KCl 14 mEq initially.  Labs of March 25 revealed  some renal insufficiency with  BUN at 1.7.  Vancomycin level was noted be  high at 51.4.  The patient's renal status has been followed along,  secondary to treatment with vancomycin.  The patient's vancomycin levels  have slowly come down with the last vancomycin of  March 29 at 21.6.  She has not required a vancomycin dose during this stay secondary to  high vancomycin levels.  The vancomycin was discontinued on March 31.  The patient's renal status was on March 28 showed BUN at 25, creatinine  2.95.  Daily labs have been done; and as the vancomycin levels in her  system have normalized out, renal status was much improved overall.  Last check of labs from April 7 reveals sodium 137, potassium 4.1,  chloride 109, CO2 25, BUN 19, creatinine 1.7, a glucose of 95.  A UA UC  was done past admission and grew out 10,000 colonies of yeast; and the   patient was treated with Diflucan x3 days for this.   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  She has had no  issues regarding headaches.  She did have complaints of right shoulder  pain secondary to rotator cuff tendonitis and was started on ibuprofen  600 mg p.o. t.i.d. with improvement.  She declined a steroid injection  of this joint.  The patient's p.o. intake has been good.  She has been  continent of bowel and bladder.  Left posterior cranial incision has  healed; and edema of the site has resolved.  During her stay in rehab  Carrie [name] has done well.  Currently she has progressed along in self-  care; requiring some assist for right upper extremity range of motion  the.  At time of discharge the patient was independent for sitting-  standing balance.  She was moderate-independent for standing balance  with the rolling walker.  She was able to ambulate 150 feet with close  supervision with safety concerns due to occasional loss of balance due  to visual deficits.  Speech therapy evaluation showed basic high-level  comprehension to be intact.  Verbal expression intact without aphasia or  dysarthria.  She was able to be read large print and write her name  without difficulty.  Attention and awareness was intact with the  patient's working memory intact for organization, problem solving and  reasoning.  The patient will continue to receive further follow up from  home health, PT, and OT past discharge.  She is advised to followup with  neuro-ophthalmology in 4-6 weeks; if vision changes do not gradually  improve.  On September 10, 2006 the patient is discharged to home.   DISCHARGE MEDICATIONS:  1. Keppra 500 mg two p.o. b.i.d.  2. Ferrous sulfate 325 mg a day.  3. Norvasc 10 mg a day.  4. Multivitamin one per day.  5. Ibuprofen 600 mg b.i.d.   DIET:  Regular   ACTIVITY LEVEL:  24-hour supervision, no strenuous activity.  SPECIAL INSTRUCTIONS:  Do not use  lisinopril.  [facility] to  provide PT, OT.   FOLLOWUP:  1. The patient to follow up with Dr. Silvio Pate, on April 16 at 11:00 a.m.  2. Followup with Dr. Hal Neer for routine check in the next few weeks.  3. Followup with Dr. Frederico Hamman neuro-ophthalmology in 4-6 weeks for      repeat evaluation.  4. Followup with Dr. Girtha Hake in 3 months for schedule.      Thornton Dales, P.A.    ______________________________  Jarvis Morgan,  M.D.    PP/MEDQ  D:  09/11/2006  T:  09/11/2006  Job:  [id number]   cc:   Venia Carbon, MD  Faythe Ghee, M.D.

## 2010-11-14 ENCOUNTER — Other Ambulatory Visit: Payer: Self-pay | Admitting: Internal Medicine

## 2011-02-07 ENCOUNTER — Encounter: Payer: Self-pay | Admitting: Internal Medicine

## 2011-02-08 ENCOUNTER — Ambulatory Visit (INDEPENDENT_AMBULATORY_CARE_PROVIDER_SITE_OTHER): Payer: Medicare Other | Admitting: Internal Medicine

## 2011-02-08 ENCOUNTER — Encounter: Payer: Self-pay | Admitting: Internal Medicine

## 2011-02-08 VITALS — BP 125/64 | HR 94 | Temp 98.1°F | Wt 295.5 lb

## 2011-02-08 DIAGNOSIS — E119 Type 2 diabetes mellitus without complications: Secondary | ICD-10-CM

## 2011-02-08 DIAGNOSIS — E1129 Type 2 diabetes mellitus with other diabetic kidney complication: Secondary | ICD-10-CM | POA: Insufficient documentation

## 2011-02-08 DIAGNOSIS — I10 Essential (primary) hypertension: Secondary | ICD-10-CM

## 2011-02-08 DIAGNOSIS — E7800 Pure hypercholesterolemia, unspecified: Secondary | ICD-10-CM

## 2011-02-08 NOTE — Assessment & Plan Note (Signed)
No problems with the meds Will check labs

## 2011-02-08 NOTE — Progress Notes (Signed)
Subjective:    Patient ID: Carrie [name], female    DOB: [birth year], 60 y.o.   MRN: [id number]  HPI Doing "wonderful" Trying to eat better---reducing bread and healthier snacks Husband cooks for his diabetes and they eat together Tries to walk a little---like at store  Hasn't checked her sugars Discussed using husband's machine  No chest pain No SOB No sig edema---except if prolonged standing No palpitations  Still on pravastatin No GI problems or myalgias  Current Outpatient Prescriptions on File Prior to Visit  Medication Sig Dispense Refill  . amLODipine (NORVASC) 10 MG tablet TAKE ONE TABLET BY MOUTH EVERY DAY  90 tablet  3  . ibuprofen (ADVIL,MOTRIN) 600 MG tablet Take 600 mg by mouth 2 (two) times daily.        [name] levETIRAcetam (KEPPRA) 500 MG tablet Take 500 mg by mouth 2 (two) times daily.        . Multiple Vitamin (MULTIVITAMIN) tablet Take 1 tablet by mouth daily.        . pravastatin (PRAVACHOL) 40 MG tablet Take 40 mg by mouth daily.        [name] triamterene-hydrochlorothiazide (MAXZIDE) 75-50 MG per tablet TAKE ONE-HALF TABLET BY MOUTH EVERY DAY FOR BLOOD PRESSURE  90 tablet  3    Allergies  Allergen Reactions  . Naproxen Sodium     REACTION: unknown  . Sulfamethoxazole W/Trimethoprim     REACTION: Unknown rxn    Past Medical History  Diagnosis Date  . Allergy   . Hypertension   . Meningioma     Foramen magnum  . Diabetes mellitus 2011    Past Surgical History  Procedure Date  . Cesarean section 1981  . Cervical discectomy 1996    Dr. Alric Seton  . Right wrist x-ray 2007    Deg. at 1st MCP  . Brain stem tumor 03/08    Foramina magnum meningioma    Family History  Problem Relation Age of Onset  . Hypertension Other     Strong family history     History   Social History  . Marital Status: Married    Spouse Name: N/A    Number of Children: 1  . Years of Education: N/A   Occupational History  . 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  Disabled since brain surgery    Social History Main Topics  . Smoking status: Never Smoker   . Smokeless tobacco: Not on file  . Alcohol Use: No  . Drug Use: Not on file  . Sexually Active: Not on file   Other Topics Concern  . Not on file   Social History Narrative   Lives in family home with extended family.   Review of Systems No neuro problems like headache. Hasn't had any follow up occ stress and nerves but not prolonged Sleeping is not great---uses [employer] sleep aide occ    Objective:   Physical Exam  Constitutional: She appears well-developed and well-nourished. No distress.  Neck: Normal range of motion. Neck supple. No thyromegaly present.  Cardiovascular: Normal rate, regular rhythm, normal heart sounds and intact distal pulses.  Exam reveals no gallop.   No murmur heard. Pulmonary/Chest: Effort normal and breath sounds normal. No respiratory distress. She has no wheezes. She has no rales.  Musculoskeletal: Normal range of motion. She exhibits no edema and no tenderness.  Lymphadenopathy:    She has no cervical adenopathy.  Neurological:       Normal sensation in feet  Skin:  Skin is warm. No rash noted.  Psychiatric: She has a normal mood and affect. Her behavior is normal. Judgment and thought content normal.          Assessment & Plan:

## 2011-02-08 NOTE — Assessment & Plan Note (Signed)
New diagnosis for her still Will check labs Probably needs to start metformin

## 2011-02-08 NOTE — Assessment & Plan Note (Signed)
BP Readings from Last 3 Encounters:  02/08/11 125/64  03/20/10 140/80  08/12/09 136/72   Good control  No changes  Due for labs

## 2011-02-09 ENCOUNTER — Other Ambulatory Visit: Payer: Medicare Other

## 2011-02-09 ENCOUNTER — Ambulatory Visit: Payer: Self-pay | Admitting: Internal Medicine

## 2011-02-09 LAB — BASIC METABOLIC PANEL
BUN: 21 mg/dL (ref 6–23)
GFR: 65.43 mL/min (ref >60.00)
Potassium: 3.8 mEq/L (ref 3.5–5.1)
Sodium: 138 mEq/L (ref 135–145)

## 2011-02-09 LAB — HEPATIC FUNCTION PANEL
AST: 17 U/L (ref 0–37)
Albumin: 3.9 g/dL (ref 3.5–5.2)
Alkaline Phosphatase: 94 U/L (ref 39–117)

## 2011-02-09 LAB — CBC WITH DIFFERENTIAL/PLATELET
Basophils Absolute: 0 10*3/uL (ref 0.0–0.1)
Basophils Relative: 0.3 % (ref 0.0–3.0)
Eosinophils Absolute: 0.1 10*3/uL (ref 0.0–0.7)
Eosinophils Relative: 1.1 % (ref 0.0–5.0)
MCHC: 32.7 g/dL (ref 30.0–36.0)
MCV: 86 fl (ref 78.0–100.0)
Monocytes Absolute: 0.6 10*3/uL (ref 0.1–1.0)
Monocytes Relative: 6.5 % (ref 3.0–12.0)
Platelets: 284 10*3/uL (ref 150.0–400.0)

## 2011-02-09 LAB — LIPID PANEL
Cholesterol: 194 mg/dL (ref 0–200)
HDL: 42.7 mg/dL (ref >39.00)
Triglycerides: 112 mg/dL (ref 0.0–149.0)
VLDL: 22.4 mg/dL (ref 0.0–40.0)

## 2011-02-15 ENCOUNTER — Telehealth: Payer: Self-pay | Admitting: *Deleted

## 2011-02-15 ENCOUNTER — Encounter: Payer: Self-pay | Admitting: *Deleted

## 2011-02-15 MED ORDER — METFORMIN HCL 500 MG PO TABS: 500.0000 mg | ORAL_TABLET | Freq: Two times a day (BID) | ORAL | Status: DC

## 2011-02-15 NOTE — Telephone Encounter (Signed)
Spoke with patient and advised results rx sent to pharmacy by e-script  

## 2011-02-15 NOTE — Telephone Encounter (Signed)
.  left message to have patient return my call. letter mailed to patients home address with results.

## 2011-02-15 NOTE — Telephone Encounter (Signed)
Message copied by Despina Hidden on Thu Feb 15, 2011  7:59 AM ------      Message from: Viviana Simpler I      Created: Sun Feb 11, 2011  1:27 PM       Please call      Diabetes is actually okay with HgbA1c of 7.3% but it would still be best to start metformin 500mg  bid (1 year Rx). We will recheck labs at her next appt      CHol is pretty good with total of 194 and LDL or bad chol of 129. Continue the pravastatin      Blood count, liver, kidney and thyroid are all normal

## 2011-03-09 ENCOUNTER — Ambulatory Visit (INDEPENDENT_AMBULATORY_CARE_PROVIDER_SITE_OTHER): Payer: Medicare Other

## 2011-03-09 DIAGNOSIS — Z23 Encounter for immunization: Secondary | ICD-10-CM

## 2011-04-15 ENCOUNTER — Other Ambulatory Visit: Payer: Self-pay | Admitting: Internal Medicine

## 2011-05-11 ENCOUNTER — Ambulatory Visit (INDEPENDENT_AMBULATORY_CARE_PROVIDER_SITE_OTHER): Payer: Medicare Other | Admitting: Internal Medicine

## 2011-05-11 ENCOUNTER — Encounter: Payer: Self-pay | Admitting: Internal Medicine

## 2011-05-11 VITALS — BP 130/75 | HR 97 | Temp 98.8°F | Ht 65.0 in | Wt 293.0 lb

## 2011-05-11 DIAGNOSIS — J019 Acute sinusitis, unspecified: Secondary | ICD-10-CM

## 2011-05-11 MED ORDER — AMOXICILLIN 500 MG PO TABS: 1000.0000 mg | ORAL_TABLET | Freq: Two times a day (BID) | ORAL | Status: AC

## 2011-05-11 NOTE — Progress Notes (Signed)
  Subjective:    Patient ID: Carrie [name], female    DOB: 08-31-1950, 60 y.o.   MRN: [id number]  HPI Has been sick for 4 days Many coughing spells Clear nasal rhinorrhea Feels "exhaused" Barely could get out of bed No fever Some SOB  Throat was briefly scratchy Head is congested with some frontal headache Ear pain as well  Tylenol PM may have helped some. Used husband's cough syrup--helped a bit  Current Outpatient Prescriptions on File Prior to Visit  Medication Sig Dispense Refill  . amLODipine (NORVASC) 10 MG tablet TAKE ONE TABLET BY MOUTH EVERY DAY  90 tablet  3  . ibuprofen (ADVIL,MOTRIN) 600 MG tablet Take 600 mg by mouth 2 (two) times daily.        [name] levETIRAcetam (KEPPRA) 500 MG tablet TAKE TWO TABLETS BY MOUTH TWICE DAILY  120 tablet  6  . metFORMIN (GLUCOPHAGE) 500 MG tablet Take 1 tablet (500 mg total) by mouth 2 (two) times daily with a meal.  180 tablet  3  . Multiple Vitamin (MULTIVITAMIN) tablet Take 1 tablet by mouth daily.        . pravastatin (PRAVACHOL) 40 MG tablet Take 40 mg by mouth daily.        [name] triamterene-hydrochlorothiazide (MAXZIDE) 75-50 MG per tablet TAKE ONE-HALF TABLET BY MOUTH EVERY DAY FOR BLOOD PRESSURE  90 tablet  3    Allergies  Allergen Reactions  . Naproxen Sodium     REACTION: unknown  . Sulfamethoxazole W/Trimethoprim     REACTION: Unknown rxn    Past Medical History  Diagnosis Date  . Allergy   . Hypertension   . Meningioma     Foramen magnum  . Diabetes mellitus 2011    Past Surgical History  Procedure Date  . Cesarean section 1981  . Cervical discectomy 1996    Dr. Alric Seton  . Right wrist x-ray 2007    Deg. at 1st MCP  . Brain stem tumor 03/08    Foramina magnum meningioma    Family History  Problem Relation Age of Onset  . Hypertension Other     Strong family history     History   Social History  . Marital Status: Married    Spouse Name: N/A    Number of Children: 1  . Years of Education: N/A    Occupational History  . 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 Disabled since brain surgery    Social History Main Topics  . Smoking status: Never Smoker   . Smokeless tobacco: Never Used  . Alcohol Use: No  . Drug Use: Not on file  . Sexually Active: Not on file   Other Topics Concern  . Not on file   Social History Narrative   Lives in family home with extended family.   Review of Systems No rash No vomiting or diarrhea Appetite is off No body aching    Objective:   Physical Exam  Constitutional: She appears well-developed and well-nourished. No distress.  HENT:  Mouth/Throat: Oropharynx is clear and moist. No oropharyngeal exudate.       Maxillary and frontal sinus tenderness Marked nasal inflammation TMs normal  Neck: Normal range of motion. Neck supple.  Pulmonary/Chest: Effort normal and breath sounds normal. No respiratory distress. She has no wheezes. She has no rales.  Lymphadenopathy:    She has no cervical adenopathy.          Assessment & Plan:

## 2011-05-11 NOTE — Patient Instructions (Signed)
Please start the amoxicillin antibiotic if you get worse, or you are not better by the middle or late next week

## 2011-05-11 NOTE — Assessment & Plan Note (Signed)
Clearly has sinus headache and tenderness but likely viral at this point Discussed symptomatic Rx If not improving next week, start amoxil

## 2011-07-16 ENCOUNTER — Other Ambulatory Visit: Payer: Self-pay | Admitting: Internal Medicine

## 2011-09-04 ENCOUNTER — Ambulatory Visit: Payer: Medicare Other | Admitting: Internal Medicine

## 2011-09-11 ENCOUNTER — Ambulatory Visit: Payer: Medicare Other | Admitting: Internal Medicine

## 2011-09-26 ENCOUNTER — Encounter: Payer: Self-pay | Admitting: Internal Medicine

## 2011-09-26 ENCOUNTER — Ambulatory Visit (INDEPENDENT_AMBULATORY_CARE_PROVIDER_SITE_OTHER): Payer: Medicare Other | Admitting: Internal Medicine

## 2011-09-26 VITALS — BP 118/70 | HR 99 | Temp 98.5°F | Ht 65.0 in | Wt 291.0 lb

## 2011-09-26 DIAGNOSIS — E7800 Pure hypercholesterolemia, unspecified: Secondary | ICD-10-CM

## 2011-09-26 DIAGNOSIS — I10 Essential (primary) hypertension: Secondary | ICD-10-CM

## 2011-09-26 DIAGNOSIS — E119 Type 2 diabetes mellitus without complications: Secondary | ICD-10-CM

## 2011-09-26 MED ORDER — ATORVASTATIN CALCIUM 40 MG PO TABS: 40.0000 mg | ORAL_TABLET | Freq: Every day | ORAL | Status: DC

## 2011-09-26 NOTE — Assessment & Plan Note (Signed)
Will change to atorvastatin 40 to try to get LDL under 100

## 2011-09-26 NOTE — Patient Instructions (Signed)
Please finish your pravastatin and then change to the atorvastatin Set up blood work in about 2 months----- lipid, hepatic (272.4) and HgbA1c (250.00)

## 2011-09-26 NOTE — Assessment & Plan Note (Signed)
BP Readings from Last 3 Encounters:  09/26/11 118/70  05/11/11 130/75  02/08/11 125/64   Good control  No changes needed

## 2011-09-26 NOTE — Progress Notes (Signed)
Subjective:    Patient ID: Carrie [name], female    DOB: 02-11-1951, 61 y.o.   MRN: [id number]  HPI Doing well Here with husband  Still trying to eat right Tries to walk when she can Still not checking sugars--husband has machine though No problems with metformin No hypoglycemia  Discussed cholesterol No problems with pravastatin but not at goal  No chest pain No edema No SOB No dizziness or syncope  Current Outpatient Prescriptions on File Prior to Visit  Medication Sig Dispense Refill  . amLODipine (NORVASC) 10 MG tablet TAKE ONE TABLET BY MOUTH EVERY DAY  90 tablet  3  . ibuprofen (ADVIL,MOTRIN) 600 MG tablet TAKE ONE TABLET BY MOUTH TWICE DAILY  180 tablet  3  . levETIRAcetam (KEPPRA) 500 MG tablet TAKE TWO TABLETS BY MOUTH TWICE DAILY  120 tablet  6  . metFORMIN (GLUCOPHAGE) 500 MG tablet Take 1 tablet (500 mg total) by mouth 2 (two) times daily with a meal.  180 tablet  3  . Multiple Vitamin (MULTIVITAMIN) tablet Take 1 tablet by mouth daily.        . pravastatin (PRAVACHOL) 40 MG tablet TAKE ONE TABLET BY MOUTH EVERY DAY FOR HIGH CHOLESTEROL  90 tablet  3  . triamterene-hydrochlorothiazide (MAXZIDE) 75-50 MG per tablet TAKE ONE-HALF TABLET BY MOUTH EVERY DAY FOR BLOOD PRESSURE  90 tablet  3    Allergies  Allergen Reactions  . Naproxen Sodium     REACTION: unknown  . Sulfamethoxazole W/Trimethoprim     REACTION: Unknown rxn    Past Medical History  Diagnosis Date  . Allergy   . Hypertension   . Meningioma     Foramen magnum  . Diabetes mellitus 2011    Past Surgical History  Procedure Date  . Cesarean section 1981  . Cervical discectomy 1996    Dr. Alric Seton  . Right wrist x-ray 2007    Deg. at 1st MCP  . Brain stem tumor 03/08    Foramina magnum meningioma    Family History  Problem Relation Age of Onset  . Hypertension Other     Strong family history     History   Social History  . Marital Status: Married    Spouse Name: N/A    Number  of Children: 1  . Years of Education: N/A   Occupational History  . 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 Disabled since brain surgery    Social History Main Topics  . Smoking status: Never Smoker   . Smokeless tobacco: Never Used  . Alcohol Use: No  . Drug Use: Not on file  . Sexually Active: Not on file   Other Topics Concern  . Not on file   Social History Narrative   Lives in family home with extended family.   Review of Systems Weight down slightly Released by neurosurgeon---remains on seizure medication Sleep is still not great---uses occ tylenol PM and this helps. No AM grogginess     Objective:   Physical Exam  Constitutional: She appears well-developed and well-nourished. No distress.  Neck: Normal range of motion. Neck supple. No thyromegaly present.  Cardiovascular: Normal rate, regular rhythm and normal heart sounds.  Exam reveals no gallop.   No murmur heard.      Faint pulse on left, 1+ on right  Pulmonary/Chest: Effort normal and breath sounds normal. No respiratory distress. She has no wheezes. She has no rales.  Musculoskeletal: She exhibits no edema and no tenderness.  Lymphadenopathy:    She has no cervical adenopathy.  Skin: No rash noted. No erythema.       No ulcers or foot lesions  Psychiatric: She has a normal mood and affect. Her behavior is normal.          Assessment & Plan:

## 2011-09-26 NOTE — Assessment & Plan Note (Signed)
Seems to have good control No problems with metformin Will check labs soon (after statin change)

## 2011-10-05 ENCOUNTER — Ambulatory Visit: Payer: Medicare Other | Admitting: Internal Medicine

## 2011-10-05 DIAGNOSIS — Z0289 Encounter for other administrative examinations: Secondary | ICD-10-CM

## 2011-11-14 ENCOUNTER — Other Ambulatory Visit: Payer: Self-pay | Admitting: *Deleted

## 2011-11-14 MED ORDER — LEVETIRACETAM 500 MG PO TABS: 1000.0000 mg | ORAL_TABLET | Freq: Two times a day (BID) | ORAL | Status: DC

## 2011-11-14 MED ORDER — AMLODIPINE BESYLATE 10 MG PO TABS: 10.0000 mg | ORAL_TABLET | Freq: Every day | ORAL | Status: DC

## 2011-11-23 ENCOUNTER — Other Ambulatory Visit: Payer: Self-pay | Admitting: Internal Medicine

## 2011-11-23 DIAGNOSIS — I10 Essential (primary) hypertension: Secondary | ICD-10-CM

## 2011-11-23 DIAGNOSIS — E785 Hyperlipidemia, unspecified: Secondary | ICD-10-CM

## 2011-11-23 DIAGNOSIS — E119 Type 2 diabetes mellitus without complications: Secondary | ICD-10-CM

## 2011-11-26 ENCOUNTER — Other Ambulatory Visit (INDEPENDENT_AMBULATORY_CARE_PROVIDER_SITE_OTHER): Payer: Medicare Other

## 2011-11-26 DIAGNOSIS — E785 Hyperlipidemia, unspecified: Secondary | ICD-10-CM

## 2011-11-26 DIAGNOSIS — E119 Type 2 diabetes mellitus without complications: Secondary | ICD-10-CM

## 2011-11-26 DIAGNOSIS — I10 Essential (primary) hypertension: Secondary | ICD-10-CM

## 2011-11-26 LAB — LIPID PANEL
LDL Cholesterol: 97 mg/dL (ref 0–99)
Total CHOL/HDL Ratio: 4
VLDL: 27 mg/dL (ref 0.0–40.0)

## 2011-11-26 LAB — BASIC METABOLIC PANEL
GFR: 57.85 mL/min — ABNORMAL LOW (ref >60.00)
Glucose, Bld: 116 mg/dL — ABNORMAL HIGH (ref 70–99)
Potassium: 4 mEq/L (ref 3.5–5.1)
Sodium: 138 mEq/L (ref 135–145)

## 2011-11-26 LAB — HEMOGLOBIN A1C: Hgb A1c MFr Bld: 6.7 % — ABNORMAL HIGH (ref 4.6–6.5)

## 2011-11-26 LAB — HEPATIC FUNCTION PANEL
Bilirubin, Direct: 0 mg/dL (ref 0.0–0.3)
Total Bilirubin: 0.2 mg/dL — ABNORMAL LOW (ref 0.3–1.2)

## 2011-11-26 LAB — CBC WITH DIFFERENTIAL/PLATELET
Eosinophils Relative: 2.8 % (ref 0.0–5.0)
MCV: 85.5 fl (ref 78.0–100.0)
Monocytes Absolute: 0.7 10*3/uL (ref 0.1–1.0)
Monocytes Relative: 7.8 % (ref 3.0–12.0)
Neutrophils Relative %: 43.8 % (ref 43.0–77.0)
Platelets: 305 10*3/uL (ref 150.0–400.0)
WBC: 9.4 10*3/uL (ref 4.5–10.5)

## 2011-11-26 LAB — MICROALBUMIN / CREATININE URINE RATIO
Creatinine,U: 82.2 mg/dL
Microalb Creat Ratio: 59 mg/g — ABNORMAL HIGH (ref 0.0–30.0)
Microalb, Ur: 48.5 mg/dL — ABNORMAL HIGH (ref 0.0–1.9)

## 2011-11-29 ENCOUNTER — Encounter: Payer: Self-pay | Admitting: *Deleted

## 2012-03-05 ENCOUNTER — Ambulatory Visit (INDEPENDENT_AMBULATORY_CARE_PROVIDER_SITE_OTHER): Payer: Medicare Other

## 2012-03-05 DIAGNOSIS — Z23 Encounter for immunization: Secondary | ICD-10-CM

## 2012-05-12 ENCOUNTER — Encounter: Payer: Medicare Other | Admitting: Internal Medicine

## 2012-05-16 ENCOUNTER — Encounter: Payer: Medicare Other | Admitting: Internal Medicine

## 2012-06-30 ENCOUNTER — Telehealth: Payer: Self-pay | Admitting: Internal Medicine

## 2012-06-30 MED ORDER — ALPRAZOLAM 0.25 MG PO TABS: 0.2500 mg | ORAL_TABLET | Freq: Three times a day (TID) | ORAL | Status: DC | PRN

## 2012-06-30 NOTE — Telephone Encounter (Signed)
rx called into pharmacy Spoke with patient and advised results   

## 2012-06-30 NOTE — Telephone Encounter (Signed)
Okay to send order for alprazolam 0.25mg  1-2 tid prn for nerves #60 x 0

## 2012-06-30 NOTE — Telephone Encounter (Signed)
Caller: Carrie [name]/Patient; Phone: [phone]; Reason for Call: Patient calling, their daughter passed away on 07/06/22 in [place] [name].  She is asking for something for her nerves to help her through this time.   Uses [facility] as noted in her chart.

## 2012-07-08 ENCOUNTER — Encounter: Payer: Self-pay | Admitting: Internal Medicine

## 2012-07-08 ENCOUNTER — Ambulatory Visit (INDEPENDENT_AMBULATORY_CARE_PROVIDER_SITE_OTHER): Payer: Medicare Other | Admitting: Internal Medicine

## 2012-07-08 VITALS — BP 128/80 | HR 89 | Temp 97.7°F | Ht 64.0 in | Wt 272.0 lb

## 2012-07-08 DIAGNOSIS — I10 Essential (primary) hypertension: Secondary | ICD-10-CM

## 2012-07-08 DIAGNOSIS — Z0000 Encounter for general adult medical examination without abnormal findings: Secondary | ICD-10-CM

## 2012-07-08 DIAGNOSIS — Z23 Encounter for immunization: Secondary | ICD-10-CM

## 2012-07-08 DIAGNOSIS — Z6841 Body Mass Index (BMI) 40.0 and over, adult: Secondary | ICD-10-CM | POA: Insufficient documentation

## 2012-07-08 DIAGNOSIS — E7800 Pure hypercholesterolemia, unspecified: Secondary | ICD-10-CM

## 2012-07-08 DIAGNOSIS — Z1211 Encounter for screening for malignant neoplasm of colon: Secondary | ICD-10-CM

## 2012-07-08 DIAGNOSIS — E119 Type 2 diabetes mellitus without complications: Secondary | ICD-10-CM

## 2012-07-08 LAB — LIPID PANEL
HDL: 41.7 mg/dL (ref >39.00)
LDL Cholesterol: 103 mg/dL — ABNORMAL HIGH (ref 0–99)
Total CHOL/HDL Ratio: 4
Triglycerides: 113 mg/dL (ref 0.0–149.0)

## 2012-07-08 LAB — HEPATIC FUNCTION PANEL
ALT: 14 U/L (ref 0–35)
Alkaline Phosphatase: 101 U/L (ref 39–117)
Bilirubin, Direct: 0 mg/dL (ref 0.0–0.3)
Total Bilirubin: 0.4 mg/dL (ref 0.3–1.2)

## 2012-07-08 LAB — CBC WITH DIFFERENTIAL/PLATELET
Basophils Relative: 0.5 % (ref 0.0–3.0)
Eosinophils Absolute: 0.2 10*3/uL (ref 0.0–0.7)
Eosinophils Relative: 2.1 % (ref 0.0–5.0)
Lymphocytes Relative: 36.3 % (ref 12.0–46.0)
Monocytes Relative: 6.1 % (ref 3.0–12.0)
Neutrophils Relative %: 55 % (ref 43.0–77.0)
RBC: 3.94 Mil/uL (ref 3.87–5.11)
WBC: 7.9 10*3/uL (ref 4.5–10.5)

## 2012-07-08 LAB — BASIC METABOLIC PANEL
Calcium: 9.4 mg/dL (ref 8.4–10.5)
Creatinine, Ser: 1.2 mg/dL (ref 0.4–1.2)
GFR: 59.43 mL/min — ABNORMAL LOW (ref >60.00)
Sodium: 139 mEq/L (ref 135–145)

## 2012-07-08 NOTE — Assessment & Plan Note (Signed)
Lab Results  Component Value Date   HGBA1C 6.7* 11/26/2011   Hasn't been checking hoepfully still good control

## 2012-07-08 NOTE — Assessment & Plan Note (Signed)
Will recheck  No trouble with the med Lab Results  Component Value Date   LDLCALC 97 11/26/2011

## 2012-07-08 NOTE — Progress Notes (Signed)
Subjective:    Patient ID: Carrie [name], female    DOB: Nov 22, 1950, 62 y.o.   MRN: [id number]  HPI Here for physical Having a tough time due to daughter's recent suicide Her sister Carrie [name] is now staying with them to help and give support  Hasn't had physical in some time  Has been watching her diet more Has lost some weight Not checking sugars No low sugar reactions  Current Outpatient Prescriptions on File Prior to Visit  Medication Sig Dispense Refill  . ALPRAZolam (XANAX) 0.25 MG tablet Take 1-2 tablets (0.25-0.5 mg total) by mouth 3 (three) times daily as needed.  60 tablet  0  . amLODipine (NORVASC) 10 MG tablet Take 1 tablet (10 mg total) by mouth daily.  90 tablet  3  . atorvastatin (LIPITOR) 40 MG tablet Take 1 tablet (40 mg total) by mouth daily.  30 tablet  11  . furosemide (LASIX) 40 MG tablet       . ibuprofen (ADVIL,MOTRIN) 600 MG tablet TAKE ONE TABLET BY MOUTH TWICE DAILY  180 tablet  3  . levETIRAcetam (KEPPRA) 500 MG tablet Take 2 tablets (1,000 mg total) by mouth 2 (two) times daily.  120 tablet  11  . metFORMIN (GLUCOPHAGE) 500 MG tablet Take 1 tablet (500 mg total) by mouth 2 (two) times daily with a meal.  180 tablet  3  . Multiple Vitamin (MULTIVITAMIN) tablet Take 1 tablet by mouth daily.        [name] triamterene-hydrochlorothiazide (MAXZIDE) 75-50 MG per tablet TAKE ONE-HALF TABLET BY MOUTH EVERY DAY FOR BLOOD PRESSURE  90 tablet  3    Allergies  Allergen Reactions  . Naproxen Sodium     REACTION: unknown  . Sulfamethoxazole W-Trimethoprim     REACTION: Unknown rxn    Past Medical History  Diagnosis Date  . Allergy   . Hypertension   . Meningioma     Foramen magnum  . Diabetes mellitus 2011    Past Surgical History  Procedure Date  . Cesarean section 1981  . Cervical discectomy 1996    Dr. Alric Seton  . Right wrist x-ray 2007    Deg. at 1st MCP  . 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 Hypertension Other     Strong family history     History   Social History  . Marital Status: Married    Spouse Name: N/A    Number of Children: 1  . Years of Education: N/A   Occupational History  . 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 Disabled since brain surgery    Social History Main Topics  . Smoking status: Never Smoker   . Smokeless tobacco: Never Used  . Alcohol Use: No  . Drug Use: Not on file  . Sexually Active: Not on file   Other Topics Concern  . Not on file   Social History Narrative   Lives in family home with extended family.   Review of Systems  Constitutional: Negative for fatigue.       Wears seat belt  HENT: Negative for hearing loss, dental problem and tinnitus.        Due for dental visit   Eyes: Negative for visual disturbance.       No diplopia or unilateral vision loss  Respiratory: Positive for shortness of breath. Negative for cough and chest tightness.  Stable DOE  Cardiovascular: Positive for palpitations. Negative for chest pain and leg swelling.       Palpitations with recent stress  Gastrointestinal: Negative for nausea, vomiting, abdominal pain, constipation and blood in stool.       No heartburn  Genitourinary: Negative for dysuria, frequency and difficulty urinating.  Musculoskeletal: Positive for arthralgias. Negative for back pain and joint swelling.       Some right knee pain-- occ uses tylenol or ibuprofen (fairly rarely)  Skin: Negative for rash.       No suspicious lesions  Neurological: Positive for headaches. Negative for dizziness, syncope, weakness, light-headedness and numbness.       Rare headache --had one just before daughter died but has been quiet since her surgery  Hematological: Negative for adenopathy. Does not bruise/bleed easily.  Psychiatric/Behavioral: Positive for sleep disturbance and dysphoric mood. The patient is not nervous/anxious.        Grieving daughter       Objective:    Physical Exam  Constitutional: She is oriented to person, place, and time. She appears well-developed and well-nourished. No distress.  HENT:  Head: Normocephalic and atraumatic.  Right Ear: External ear normal.  Left Ear: External ear normal.  Mouth/Throat: Oropharynx is clear and moist. No oropharyngeal exudate.  Eyes: Conjunctivae normal and EOM are normal. Pupils are equal, round, and reactive to light.  Neck: Normal range of motion. Neck supple. No thyromegaly present.  Cardiovascular: Normal rate, regular rhythm, normal heart sounds and intact distal pulses.  Exam reveals no gallop.   No murmur heard. Pulmonary/Chest: Effort normal and breath sounds normal. No respiratory distress. She has no wheezes. She has no rales.  Abdominal: Soft. There is no tenderness.  Genitourinary:       No breast masses or tenderness  Musculoskeletal: She exhibits no edema and no tenderness.  Lymphadenopathy:    She has no cervical adenopathy.    She has no axillary adenopathy.  Neurological: She is alert and oriented to person, place, and time.  Skin: No rash noted. No erythema.       Mycotic toenails No foot ulcers  Psychiatric:       Grieving Appropriate affect          Assessment & Plan:

## 2012-07-08 NOTE — Assessment & Plan Note (Signed)
Has lost some weight  Discussed further work on proper eating She will set up mammogram and eye appt Stool immunoassay Pneumovax

## 2012-07-08 NOTE — Assessment & Plan Note (Signed)
Has made some progress with eating better  DASH diet info given

## 2012-07-08 NOTE — Patient Instructions (Addendum)
Please call to schedule your routine mammogram and to get your diabetic eye exam  DASH Diet The DASH diet stands for "Dietary Approaches to Stop Hypertension." It is a healthy eating plan that has been shown to reduce high blood pressure (hypertension) in as little as 14 days, while also possibly providing other significant health benefits. These other health benefits include reducing the risk of breast cancer after menopause and reducing the risk of type 2 diabetes, heart disease, colon cancer, and stroke. Health benefits also include weight loss and slowing kidney failure in patients with chronic kidney disease.  DIET GUIDELINES  Limit salt (sodium). Your diet should contain less than 1500 mg of sodium daily.  Limit refined or processed carbohydrates. Your diet should include mostly whole grains. Desserts and added sugars should be used sparingly.  Include small amounts of heart-healthy fats. These types of fats include nuts, oils, and tub margarine. Limit saturated and trans fats. These fats have been shown to be harmful in the body. CHOOSING FOODS  The following food groups are based on a 2000 calorie diet. See your Registered Dietitian for individual calorie needs. Grains and Grain Products (6 to 8 servings daily)  Eat More Often: Whole-wheat bread, brown rice, whole-grain or wheat pasta, quinoa, popcorn without added fat or salt (air popped).  Eat Less Often: White bread, white pasta, white rice, cornbread. Vegetables (4 to 5 servings daily)  Eat More Often: Fresh, frozen, and canned vegetables. Vegetables may be raw, steamed, roasted, or grilled with a minimal amount of fat.  Eat Less Often/Avoid: Creamed or fried vegetables. Vegetables in a cheese sauce. Fruit (4 to 5 servings daily)  Eat More Often: All fresh, canned (in natural juice), or frozen fruits. Dried fruits without added sugar. One hundred percent fruit juice ( cup [237 mL] daily).  Eat Less Often: Dried fruits with  added sugar. Canned fruit in light or heavy syrup. [employer], Fish, and Poultry (2 servings or less daily. One serving is 3 to 4 oz [85-114 g]).  Eat More Often: Ninety percent or leaner ground beef, tenderloin, sirloin. Round cuts of beef, chicken breast, [place] breast. All fish. Grill, bake, or broil your meat. Nothing should be fried.  Eat Less Often/Avoid: Fatty cuts of meat, [place], or chicken leg, thigh, or wing. Fried cuts of meat or fish. Dairy (2 to 3 servings)  Eat More Often: Low-fat or fat-free milk, low-fat plain or light yogurt, reduced-fat or part-skim cheese.  Eat Less Often/Avoid: Milk (whole, 2%).Whole milk yogurt. Full-fat cheeses. Nuts, Seeds, and Legumes (4 to 5 servings per week)  Eat More Often: All without added salt.  Eat Less Often/Avoid: Salted nuts and seeds, canned beans with added salt. Fats and Sweets (limited)  Eat More Often: Vegetable oils, tub margarines without trans fats, sugar-free gelatin. Mayonnaise and salad dressings.  Eat Less Often/Avoid: Coconut oils, palm oils, butter, stick margarine, cream, half and half, cookies, candy, pie. FOR MORE INFORMATION The Dash Diet Eating Plan: www.dashdiet.org Document Released: 05/10/2011 Document Revised: 08/13/2011 Document Reviewed: 05/10/2011 [facility] Patient Information 2013 [place].

## 2012-07-08 NOTE — Addendum Note (Signed)
Addended by: Despina Hidden on: 07/08/2012 11:37 AM   Modules accepted: Orders

## 2012-07-08 NOTE — Assessment & Plan Note (Signed)
BP Readings from Last 3 Encounters:  07/08/12 128/80  09/26/11 118/70  05/11/11 130/75   Good control No changes needed

## 2012-07-22 ENCOUNTER — Other Ambulatory Visit: Payer: Self-pay | Admitting: *Deleted

## 2012-07-22 MED ORDER — METFORMIN HCL 500 MG PO TABS: 500.0000 mg | ORAL_TABLET | Freq: Two times a day (BID) | ORAL | Status: DC

## 2012-08-12 ENCOUNTER — Other Ambulatory Visit: Payer: Self-pay | Admitting: Internal Medicine

## 2012-09-08 ENCOUNTER — Other Ambulatory Visit: Payer: Self-pay | Admitting: Internal Medicine

## 2012-10-05 ENCOUNTER — Other Ambulatory Visit: Payer: Self-pay | Admitting: Internal Medicine

## 2012-10-17 ENCOUNTER — Telehealth: Payer: Self-pay

## 2012-10-17 NOTE — Telephone Encounter (Signed)
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 If she is okay with that, send Rx for lisinopril 10mg  daily (1 year Rx) Set up appt in about 6 weeks to review how she is doing She should call if any problems with the med---like dizziness, throat itching or cough

## 2012-10-17 NOTE — Telephone Encounter (Signed)
Carrie [name], pharmacist with [facility] left v/m re; faxed info to Dr Silvio Pate due to pt being diabetic and based on American Diabetes Guidelines suggest pt needs to be on ace inhibitor or ARB. Carrie [name] request call back to see if there will be change in pt's therapy.

## 2012-10-23 NOTE — Telephone Encounter (Signed)
Spoke with patient and she is confused about this phone call, she didn't think she was diabetic and she thought her kidneys where fine, pt would like to discuss with you before starting a new medication because she's fine. Pt will be coming in today with her husband at 2:00 and she would like to mention it at that visit, she states she won't take up your time but she has a question.

## 2012-10-23 NOTE — Telephone Encounter (Signed)
Sorry for any confusion She does have mild diabetes and her sugars are controlled---but because she is on the metformin. The medicine is appropriate See if she has further concerns

## 2012-10-24 NOTE — Telephone Encounter (Signed)
Called patient, no answer or voice mail.

## 2012-10-28 ENCOUNTER — Telehealth: Payer: Self-pay | Admitting: Internal Medicine

## 2012-10-28 MED ORDER — ALPRAZOLAM 0.25 MG PO TABS: 0.2500 mg | ORAL_TABLET | Freq: Three times a day (TID) | ORAL | Status: DC | PRN

## 2012-10-28 NOTE — Telephone Encounter (Signed)
rx called into pharmacy

## 2012-10-28 NOTE — Telephone Encounter (Signed)
Dr. Silvio Pate, Patient states you told her to call to remind you to write her a rx for some medication to help her with her nerves since her husband passed away on 09-12-22.  [facility] [address].

## 2012-10-28 NOTE — Telephone Encounter (Signed)
Okay to send Rx for alprazolam 0.25mg  1 tid prn for nerves #40 x 0

## 2012-12-11 ENCOUNTER — Other Ambulatory Visit: Payer: Self-pay | Admitting: Internal Medicine

## 2013-01-16 ENCOUNTER — Ambulatory Visit (INDEPENDENT_AMBULATORY_CARE_PROVIDER_SITE_OTHER): Payer: Medicare Other | Admitting: Internal Medicine

## 2013-01-16 ENCOUNTER — Encounter: Payer: Self-pay | Admitting: Internal Medicine

## 2013-01-16 VITALS — BP 130/80 | HR 90 | Temp 97.7°F | Wt 258.0 lb

## 2013-01-16 DIAGNOSIS — E119 Type 2 diabetes mellitus without complications: Secondary | ICD-10-CM

## 2013-01-16 DIAGNOSIS — F4321 Adjustment disorder with depressed mood: Secondary | ICD-10-CM | POA: Insufficient documentation

## 2013-01-16 DIAGNOSIS — F4320 Adjustment disorder, unspecified: Secondary | ICD-10-CM | POA: Insufficient documentation

## 2013-01-16 MED ORDER — LORAZEPAM 0.5 MG PO TABS: 0.5000 mg | ORAL_TABLET | Freq: Every evening | ORAL | Status: DC | PRN

## 2013-01-16 NOTE — Assessment & Plan Note (Signed)
Doing okay but just can't sleep Will try lorazepam at bedtime prn Already hooked up with hospice for counseling if needed

## 2013-01-16 NOTE — Progress Notes (Signed)
Subjective:    Patient ID: Carrie [name], female    DOB: 01-16-1951, 62 y.o.   MRN: [id number]  HPI Husband died recently She is having some expected grieving Has met again with bereavement counselor at [place] (had been going for daughter) Just has niece Charlena Cross with her---adopted her at age 62 Just can't get to sleep Up frequently  Hasn't been checking sugars No hypoglycemic reactions Trying to walk some  Sinuses act up---tried benedryl (may have helped her sleep once) Doesn't really feel sick  Current Outpatient Prescriptions on File Prior to Visit  Medication Sig Dispense Refill  . ALPRAZolam (XANAX) 0.25 MG tablet Take 1-2 tablets (0.25-0.5 mg total) by mouth 3 (three) times daily as needed.  60 tablet  0  . amLODipine (NORVASC) 10 MG tablet TAKE 1 TABLET BY MOUTH DAILY.  90 tablet  0  . atorvastatin (LIPITOR) 40 MG tablet TAKE ONE TABLET BY MOUTH DAILY  30 tablet  11  . furosemide (LASIX) 40 MG tablet Take 40 mg by mouth daily as needed.       [name] ibuprofen (ADVIL,MOTRIN) 600 MG tablet TAKE ONE TABLET BY MOUTH TWICE A DAY  180 tablet  0  . levETIRAcetam (KEPPRA) 1000 MG tablet TAKE ONE TABLET BY MOUTH TWICE DAILY  60 tablet  11  . metFORMIN (GLUCOPHAGE) 500 MG tablet Take 1 tablet (500 mg total) by mouth 2 (two) times daily with a meal.  180 tablet  3  . Multiple Vitamin (MULTIVITAMIN) tablet Take 1 tablet by mouth daily.        [name] triamterene-hydrochlorothiazide (MAXZIDE) 75-50 MG per tablet TAKE 1/2 TABLET BY MOUTH DAILY  45 tablet  3   No current facility-administered medications on file prior to visit.    Allergies  Allergen Reactions  . Naproxen Sodium     REACTION: unknown  . Sulfamethoxazole W-Trimethoprim     REACTION: Unknown rxn    Past Medical History  Diagnosis Date  . Allergy   . Hypertension   . Meningioma     Foramen magnum  . Diabetes mellitus 2011    Past Surgical History  Procedure Laterality Date  . Cesarean section  1981  . Cervical  discectomy  1996    Dr. Alric Seton  . Right wrist x-ray  2007    Deg. at 1st MCP  . 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 Hypertension Other     Strong family history     History   Social History  . Marital Status: Widowed    Spouse Name: N/A    Number of Children: 1  . Years of Education: N/A   Occupational History  . 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 Disabled since brain surgery    Social History Main Topics  . Smoking status: Never Smoker   . Smokeless tobacco: Never Used  . Alcohol Use: No  . Drug Use: Not on file  . Sexual Activity: Not on file   Other Topics Concern  . Not on file   Social History Narrative   Lives in family home with extended family.   Review of Systems Weight is down 14#---doesn't cook as much with husband gone. Avoiding fried foods and increased vegetables    Objective:   Physical Exam  Psychiatric:  Tearful but appropriate appearance and speech          Assessment & Plan:

## 2013-03-12 ENCOUNTER — Other Ambulatory Visit: Payer: Self-pay | Admitting: Internal Medicine

## 2013-03-13 ENCOUNTER — Ambulatory Visit: Payer: Self-pay | Admitting: Internal Medicine

## 2013-03-19 ENCOUNTER — Encounter: Payer: Self-pay | Admitting: Internal Medicine

## 2013-04-09 ENCOUNTER — Other Ambulatory Visit: Payer: Self-pay

## 2013-04-14 ENCOUNTER — Other Ambulatory Visit: Payer: Self-pay | Admitting: Internal Medicine

## 2013-04-21 ENCOUNTER — Ambulatory Visit: Payer: Medicare Other | Admitting: Internal Medicine

## 2013-05-13 ENCOUNTER — Ambulatory Visit: Payer: Medicare Other | Admitting: Internal Medicine

## 2013-09-08 ENCOUNTER — Other Ambulatory Visit: Payer: Self-pay | Admitting: Internal Medicine

## 2013-09-28 LAB — HM DIABETES EYE EXAM

## 2013-10-13 ENCOUNTER — Other Ambulatory Visit: Payer: Self-pay | Admitting: Internal Medicine

## 2013-10-21 ENCOUNTER — Ambulatory Visit (INDEPENDENT_AMBULATORY_CARE_PROVIDER_SITE_OTHER): Payer: Commercial Managed Care - HMO | Admitting: Internal Medicine

## 2013-10-21 ENCOUNTER — Encounter: Payer: Self-pay | Admitting: Internal Medicine

## 2013-10-21 ENCOUNTER — Telehealth: Payer: Self-pay | Admitting: Internal Medicine

## 2013-10-21 VITALS — BP 128/80 | HR 103 | Temp 98.4°F | Wt 248.0 lb

## 2013-10-21 DIAGNOSIS — D320 Benign neoplasm of cerebral meninges: Secondary | ICD-10-CM

## 2013-10-21 DIAGNOSIS — E119 Type 2 diabetes mellitus without complications: Secondary | ICD-10-CM

## 2013-10-21 DIAGNOSIS — I10 Essential (primary) hypertension: Secondary | ICD-10-CM

## 2013-10-21 DIAGNOSIS — E7800 Pure hypercholesterolemia, unspecified: Secondary | ICD-10-CM

## 2013-10-21 LAB — CBC WITH DIFFERENTIAL/PLATELET
Basophils Absolute: 0 10*3/uL (ref 0.0–0.1)
Basophils Relative: 0.3 % (ref 0.0–3.0)
Eosinophils Absolute: 0.1 10*3/uL (ref 0.0–0.7)
Eosinophils Relative: 1.1 % (ref 0.0–5.0)
HCT: 32.3 % — ABNORMAL LOW (ref 36.0–46.0)
Hemoglobin: 10.7 g/dL — ABNORMAL LOW (ref 12.0–15.0)
LYMPHS PCT: 29.5 % (ref 12.0–46.0)
Lymphs Abs: 2.5 10*3/uL (ref 0.7–4.0)
MCHC: 33.1 g/dL (ref 30.0–36.0)
MCV: 85.5 fl (ref 78.0–100.0)
MONOS PCT: 6.1 % (ref 3.0–12.0)
Monocytes Absolute: 0.5 10*3/uL (ref 0.1–1.0)
NEUTROS PCT: 63 % (ref 43.0–77.0)
Neutro Abs: 5.3 10*3/uL (ref 1.4–7.7)
PLATELETS: 319 10*3/uL (ref 150.0–400.0)
RBC: 3.78 Mil/uL — ABNORMAL LOW (ref 3.87–5.11)
RDW: 15.5 % (ref 11.5–15.5)
WBC: 8.4 10*3/uL (ref 4.0–10.5)

## 2013-10-21 LAB — LIPID PANEL
CHOLESTEROL: 155 mg/dL (ref 0–200)
HDL: 42.5 mg/dL (ref >39.00)
LDL Cholesterol: 94 mg/dL (ref 0–99)
TRIGLYCERIDES: 92 mg/dL (ref 0.0–149.0)
Total CHOL/HDL Ratio: 4
VLDL: 18.4 mg/dL (ref 0.0–40.0)

## 2013-10-21 LAB — COMPREHENSIVE METABOLIC PANEL
ALBUMIN: 4.2 g/dL (ref 3.5–5.2)
ALT: 12 U/L (ref 0–35)
AST: 18 U/L (ref 0–37)
Alkaline Phosphatase: 79 U/L (ref 39–117)
BUN: 27 mg/dL — ABNORMAL HIGH (ref 6–23)
CO2: 28 mEq/L (ref 19–32)
Calcium: 9.6 mg/dL (ref 8.4–10.5)
Chloride: 103 mEq/L (ref 96–112)
Creatinine, Ser: 1.3 mg/dL — ABNORMAL HIGH (ref 0.4–1.2)
GFR: 51.11 mL/min — AB (ref >60.00)
Glucose, Bld: 104 mg/dL — ABNORMAL HIGH (ref 70–99)
Potassium: 3.8 mEq/L (ref 3.5–5.1)
Sodium: 140 mEq/L (ref 135–145)
TOTAL PROTEIN: 8 g/dL (ref 6.0–8.3)
Total Bilirubin: 0.3 mg/dL (ref 0.2–1.2)

## 2013-10-21 LAB — HEMOGLOBIN A1C: Hgb A1c MFr Bld: 6.2 % (ref 4.6–6.5)

## 2013-10-21 LAB — T4, FREE: FREE T4: 0.96 ng/dL (ref 0.60–1.60)

## 2013-10-21 LAB — HM DIABETES FOOT EXAM

## 2013-10-21 LAB — TSH: TSH: 1.49 u[IU]/mL (ref 0.35–4.50)

## 2013-10-21 MED ORDER — LOSARTAN POTASSIUM-HCTZ 50-12.5 MG PO TABS: 1.0000 | ORAL_TABLET | Freq: Every day | ORAL | Status: DC

## 2013-10-21 NOTE — Assessment & Plan Note (Signed)
Hopefully still good control Due for labs 

## 2013-10-21 NOTE — Assessment & Plan Note (Signed)
BP Readings from Last 3 Encounters:  10/21/13 128/80  01/16/13 130/80  07/08/12 128/80   Good control Will change to losartan for renal protection

## 2013-10-21 NOTE — Assessment & Plan Note (Signed)
No problems with statin 

## 2013-10-21 NOTE — Telephone Encounter (Signed)
Relevant patient education mailed to patient.  

## 2013-10-21 NOTE — Progress Notes (Signed)
Pre visit review using our clinic review tool, if applicable. No additional management support is needed unless otherwise documented below in the visit note. 

## 2013-10-21 NOTE — Progress Notes (Signed)
Subjective:    Patient ID: Carrie [name], female    DOB: [birth year], 63 y.o.   MRN: [id number]  HPI Doing a little better Moved back to her family home---niece, sister, etc live there Support from church family Has lots to do--watches baby with family (like honorary grandma to him)  Still not checking sugars No hypoglycemic spells Has lost 10# since last visit---eating better Tries to walk No numbness or pain in feet  No chest pain No palpitations Feels her breathing is better with activity now No dizziness or syncope  Some allergy problems now Using benedryl--discussed using other antihistamines  No neurosurgeon follow up Remains on the keppra Will get slight headache at times  Current Outpatient Prescriptions on File Prior to Visit  Medication Sig Dispense Refill  . amLODipine (NORVASC) 10 MG tablet Take 1 tablet by mouth daily.  90 tablet  2  . atorvastatin (LIPITOR) 40 MG tablet TAKE ONE TABLET BY MOUTH ONE TIME DAILY   30 tablet  10  . furosemide (LASIX) 40 MG tablet Take 40 mg by mouth daily as needed.       [name] ibuprofen (ADVIL,MOTRIN) 600 MG tablet TAKE ONE TABLET BY MOUTH TWICE DAILY   180 tablet  3  . levETIRAcetam (KEPPRA) 1000 MG tablet TAKE ONE TABLET BY MOUTH TWICE DAILY   60 tablet  10  . LORazepam (ATIVAN) 0.5 MG tablet Take 1 tablet (0.5 mg total) by mouth at bedtime as needed for anxiety.  30 tablet  0  . metFORMIN (GLUCOPHAGE) 500 MG tablet Take 1 tablet (500 mg total) by mouth 2 (two) times daily with a meal.  180 tablet  2  . Multiple Vitamin (MULTIVITAMIN) tablet Take 1 tablet by mouth daily.        [name] triamterene-hydrochlorothiazide (MAXZIDE) 75-50 MG per tablet TAKE ONE-HALF TABLET BY MOUTH DAILY   45 tablet  2   No current facility-administered medications on file prior to visit.    Allergies  Allergen Reactions  . Naproxen Sodium     REACTION: unknown  . Sulfamethoxazole-Trimethoprim     REACTION: Unknown rxn    Past Medical History    Diagnosis Date  . Allergy   . Hypertension   . Meningioma     Foramen magnum  . Diabetes mellitus 2011    Past Surgical History  Procedure Laterality Date  . Cesarean section  1981  . Cervical discectomy  1996    Dr. Alric Seton  . Right wrist x-ray  2007    Deg. at 1st MCP  . 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 Hypertension Other     Strong family history     History   Social History  . Marital Status: Widowed    Spouse Name: N/A    Number of Children: 1  . Years of Education: N/A   Occupational History  . 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 Disabled since brain surgery    Social History Main Topics  . Smoking status: Never Smoker   . Smokeless tobacco: Never Used  . Alcohol Use: No  . Drug Use: Not on file  . Sexual Activity: Not on file   Other Topics Concern  . Not on file   Social History Narrative   Lives in family home with extended family.      No living will   Requests niece, [name],  as health care POA   Would accept resuscitation attempts   No tube feeds if cognitively aware   Review of Systems Some constipation and dark stool --relates to iron in vitamins Some sleep problems    Objective:   Physical Exam  Constitutional: She appears well-developed and well-nourished. No distress.  Neck: Normal range of motion. Neck supple. No thyromegaly present.  Cardiovascular: Normal rate, regular rhythm, normal heart sounds and intact distal pulses.  Exam reveals no gallop.   No murmur heard. Pulmonary/Chest: Effort normal and breath sounds normal. No respiratory distress. She has no wheezes. She has no rales.  Musculoskeletal: She exhibits no edema and no tenderness.  Lymphadenopathy:    She has no cervical adenopathy.  Neurological:  Normal sensation in feet  Skin: No rash noted. No erythema.  No foot lesions  Psychiatric: She has a normal mood and affect. Her  behavior is normal.          Assessment & Plan:

## 2013-10-21 NOTE — Patient Instructions (Signed)
It is safer to use loratadine 10mg  daily or fexofenadine 180mg  daily for your allergies. Try to avoid the benedryl. Stop the triamterene/HCTZ and start losartan/HCTZ. Set up blood work in about 1 month (renal-- 401.9) You can try melatonin 3-6mg  at bedtime to help sleep.

## 2013-10-21 NOTE — Assessment & Plan Note (Signed)
Doing well Will continue seizure prophylaxis

## 2013-12-07 ENCOUNTER — Other Ambulatory Visit: Payer: Self-pay | Admitting: Internal Medicine

## 2014-02-03 ENCOUNTER — Encounter: Payer: Self-pay | Admitting: Internal Medicine

## 2014-02-03 ENCOUNTER — Ambulatory Visit (INDEPENDENT_AMBULATORY_CARE_PROVIDER_SITE_OTHER): Payer: Commercial Managed Care - HMO | Admitting: Internal Medicine

## 2014-02-03 VITALS — BP 130/80 | HR 91 | Temp 98.1°F | Ht 64.0 in | Wt 252.0 lb

## 2014-02-03 DIAGNOSIS — M1711 Unilateral primary osteoarthritis, right knee: Secondary | ICD-10-CM

## 2014-02-03 DIAGNOSIS — M1710 Unilateral primary osteoarthritis, unspecified knee: Secondary | ICD-10-CM

## 2014-02-03 DIAGNOSIS — I10 Essential (primary) hypertension: Secondary | ICD-10-CM

## 2014-02-03 DIAGNOSIS — K044 Acute apical periodontitis of pulpal origin: Secondary | ICD-10-CM

## 2014-02-03 DIAGNOSIS — Z1211 Encounter for screening for malignant neoplasm of colon: Secondary | ICD-10-CM

## 2014-02-03 DIAGNOSIS — M542 Cervicalgia: Secondary | ICD-10-CM | POA: Insufficient documentation

## 2014-02-03 DIAGNOSIS — Z7189 Other specified counseling: Secondary | ICD-10-CM

## 2014-02-03 DIAGNOSIS — E119 Type 2 diabetes mellitus without complications: Secondary | ICD-10-CM

## 2014-02-03 DIAGNOSIS — K047 Periapical abscess without sinus: Secondary | ICD-10-CM | POA: Insufficient documentation

## 2014-02-03 DIAGNOSIS — Z0000 Encounter for general adult medical examination without abnormal findings: Secondary | ICD-10-CM

## 2014-02-03 MED ORDER — AMOXICILLIN 500 MG PO TABS: 1000.0000 mg | ORAL_TABLET | Freq: Two times a day (BID) | ORAL | Status: DC

## 2014-02-03 NOTE — Progress Notes (Signed)
Pre visit review using our clinic review tool, if applicable. No additional management support is needed unless otherwise documented below in the visit note. 

## 2014-02-03 NOTE — Patient Instructions (Signed)
Please set up with the dentist. Take arthritis acetaminophen three times daily and only use the ibuprofen as needed. Call if you want me to give you a cortisone shot in that knee.   DASH Eating Plan DASH stands for "Dietary Approaches to Stop Hypertension." The DASH eating plan is a healthy eating plan that has been shown to reduce high blood pressure (hypertension). Additional health benefits may include reducing the risk of type 2 diabetes mellitus, heart disease, and stroke. The DASH eating plan may also help with weight loss. WHAT DO I NEED TO KNOW ABOUT THE DASH EATING PLAN? For the DASH eating plan, you will follow these general guidelines:  Choose foods with a percent daily value for sodium of less than 5% (as listed on the food label).  Use salt-free seasonings or herbs instead of table salt or sea salt.  Check with your health care provider or pharmacist before using salt substitutes.  Eat lower-sodium products, often labeled as "lower sodium" or "no salt added."  Eat fresh foods.  Eat more vegetables, fruits, and low-fat dairy products.  Choose whole grains. Look for the word "whole" as the first word in the ingredient list.  Choose fish and skinless chicken or [place] more often than red meat. Limit fish, poultry, and meat to 6 oz (170 g) each day.  Limit sweets, desserts, sugars, and sugary drinks.  Choose heart-healthy fats.  Limit cheese to 1 oz (28 g) per day.  Eat more home-cooked food and less restaurant, buffet, and fast food.  Limit fried foods.  Cook foods using methods other than frying.  Limit canned vegetables. If you do use them, rinse them well to decrease the sodium.  When eating at a restaurant, ask that your food be prepared with less salt, or no salt if possible. WHAT FOODS CAN I EAT? Seek help from a dietitian for individual calorie needs. Grains Whole grain or whole wheat bread. Brown rice. Whole grain or whole wheat pasta. Quinoa, bulgur, and  whole grain cereals. Low-sodium cereals. Corn or whole wheat flour tortillas. Whole grain cornbread. Whole grain crackers. Low-sodium crackers. Vegetables Fresh or frozen vegetables (raw, steamed, roasted, or grilled). Low-sodium or reduced-sodium tomato and vegetable juices. Low-sodium or reduced-sodium tomato sauce and paste. Low-sodium or reduced-sodium canned vegetables.  Fruits All fresh, canned (in natural juice), or frozen fruits. Meat and Other Protein Products Ground beef (85% or leaner), grass-fed beef, or beef trimmed of fat. Skinless chicken or [place]. Ground chicken or [place]. Pork trimmed of fat. All fish and seafood. Eggs. Dried beans, peas, or lentils. Unsalted nuts and seeds. Unsalted canned beans. Dairy Low-fat dairy products, such as skim or 1% milk, 2% or reduced-fat cheeses, low-fat ricotta or cottage cheese, or plain low-fat yogurt. Low-sodium or reduced-sodium cheeses. Fats and Oils Tub margarines without trans fats. Light or reduced-fat mayonnaise and salad dressings (reduced sodium). Avocado. Safflower, olive, or canola oils. Natural peanut or almond butter. Other Unsalted popcorn and pretzels. The items listed above may not be a complete list of recommended foods or beverages. Contact your dietitian for more options. WHAT FOODS ARE NOT RECOMMENDED? Grains White bread. White pasta. White rice. Refined cornbread. Bagels and croissants. Crackers that contain trans fat. Vegetables Creamed or fried vegetables. Vegetables in a cheese sauce. Regular canned vegetables. Regular canned tomato sauce and paste. Regular tomato and vegetable juices. Fruits Dried fruits. Canned fruit in light or heavy syrup. Fruit juice. Meat and Other Protein Products Fatty cuts of meat. Ribs, chicken wings, bacon,  sausage, bologna, salami, chitterlings, fatback, hot dogs, bratwurst, and packaged luncheon meats. Salted nuts and seeds. Canned beans with salt. Dairy Whole or 2% milk, cream,  half-and-half, and cream cheese. Whole-fat or sweetened yogurt. Full-fat cheeses or blue cheese. Nondairy creamers and whipped toppings. Processed cheese, cheese spreads, or cheese curds. Condiments Onion and garlic salt, seasoned salt, table salt, and sea salt. Canned and packaged gravies. Worcestershire sauce. Tartar sauce. Barbecue sauce. Teriyaki sauce. Soy sauce, including reduced sodium. Steak sauce. Fish sauce. Oyster sauce. Cocktail sauce. Horseradish. Ketchup and mustard. Meat flavorings and tenderizers. Bouillon cubes. Hot sauce. Tabasco sauce. Marinades. Taco seasonings. Relishes. Fats and Oils Butter, stick margarine, lard, shortening, ghee, and bacon fat. Coconut, palm kernel, or palm oils. Regular salad dressings. Other Pickles and olives. Salted popcorn and pretzels. The items listed above may not be a complete list of foods and beverages to avoid. Contact your dietitian for more information. WHERE CAN I FIND MORE INFORMATION? National Heart, Lung, and Blood Institute: travelstabloid.com Document Released: 05/10/2011 Document Revised: 10/05/2013 Document Reviewed: 03/25/2013 [facility] Patient Information 2015 [place], [place]. This information is not intended to replace advice given to you by your health care provider. Make sure you discuss any questions you have with your health care provider.

## 2014-02-03 NOTE — Assessment & Plan Note (Signed)
I fear a deep seated infection Will treat with amoxil and she will set up with dentist

## 2014-02-03 NOTE — Assessment & Plan Note (Signed)
BP Readings from Last 3 Encounters:  02/03/14 130/80  10/21/13 128/80  01/16/13 130/80   Good control

## 2014-02-03 NOTE — Assessment & Plan Note (Signed)
I believe this is related to her past surgery Can try heat, massage

## 2014-02-03 NOTE — Progress Notes (Signed)
Subjective:    Patient ID: Carrie [name], female    DOB: 05-02-1951, 63 y.o.   MRN: [id number]  HPI Here with sister For Medicare wellness and follow up Reviewed form and advanced directives No falls No falls Did have brief "break down" period mourning her husband's death about 2 weeks ago--- got over this and back to normal. 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 amLODipine (NORVASC) 10 MG tablet TAKE ONE TABLET BY MOUTH ONE TIME DAILY   90 tablet  1  . atorvastatin (LIPITOR) 40 MG tablet TAKE ONE TABLET BY MOUTH ONE TIME DAILY   30 tablet  10  . furosemide (LASIX) 40 MG tablet Take 40 mg by mouth daily as needed.       [name] ibuprofen (ADVIL,MOTRIN) 600 MG tablet TAKE ONE TABLET BY MOUTH TWICE DAILY   180 tablet  3  . levETIRAcetam (KEPPRA) 1000 MG tablet TAKE ONE TABLET BY MOUTH TWICE DAILY   60 tablet  10  . LORazepam (ATIVAN) 0.5 MG tablet Take 1 tablet (0.5 mg total) by mouth at bedtime as needed for anxiety.  30 tablet  0  . losartan-hydrochlorothiazide (HYZAAR) 50-12.5 MG per tablet Take 1 tablet by mouth daily.  90 tablet  3  . metFORMIN (GLUCOPHAGE) 500 MG tablet Take 1 tablet (500 mg total) by mouth 2 (two) times daily with a meal.  180 tablet  2  . Multiple Vitamin (MULTIVITAMIN) tablet Take 1  tablet by mouth daily.         No current facility-administered medications on file prior to visit.    Allergies  Allergen Reactions  . Naproxen Sodium     REACTION: unknown  . Sulfamethoxazole-Trimethoprim     REACTION: Unknown rxn    Past Medical History  Diagnosis Date  . Allergy   . Hypertension   . Meningioma     Foramen magnum  . Diabetes mellitus 2011    Past Surgical History  Procedure Laterality Date  . Cesarean section  1981  . Cervical discectomy  1996    Dr. Alric Seton  . Right wrist x-ray  2007    Deg. at 1st MCP  . 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 Hypertension Other     Strong family history     History   Social History  . Marital Status: Widowed    Spouse Name: N/A    Number of Children: 1  . Years of Education: N/A   Occupational History  . 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 Disabled since brain surgery    Social History Main Topics  . Smoking status: Never Smoker   . Smokeless tobacco: Never Used  . Alcohol Use:  No  . Drug Use: Not on file  . Sexual Activity: Not on file   Other Topics Concern  . Not on file   Social History Narrative   Lives in family home with extended family.      No living will   Requests niece, [name], as health care POA   Would accept resuscitation attempts   No tube feeds if cognitively aware   Review of Systems No seizures--still on keppra Gets some tingling in her head Limited ROM of neck Weight up 4#    Objective:   Physical Exam  Constitutional: She is oriented to person, place, and time. She appears well-developed and well-nourished. No distress.  HENT:  Redness along lower incisors on left and premolar--swollen  Neck: No thyromegaly present.  Marked limitation of neck extension(since surgery)  Cardiovascular: Normal rate, regular rhythm and normal heart sounds.  Exam reveals no gallop.   No murmur  heard. Pulmonary/Chest: Effort normal and breath sounds normal. No respiratory distress. She has no wheezes. She has no rales.  Abdominal: Soft. There is no tenderness.  Musculoskeletal: She exhibits no edema and no tenderness.  Marked crepitus in right knee. Good passive ROM with no ligament or meniscus findings. ?small effusion Mild crepitus on left  Lymphadenopathy:    She has no cervical adenopathy.  Neurological: She is alert and oriented to person, place, and time.  President -- "Elyn Peers, Tawni Pummel, the other Bush?--then Clinton" 100-? D-l-r-ow Recall 3/3          Assessment & Plan:

## 2014-02-03 NOTE — Addendum Note (Signed)
Addended by: Despina Hidden on: 02/03/2014 12:30 PM   Modules accepted: Orders

## 2014-02-03 NOTE — Assessment & Plan Note (Signed)
Hasn't been checking No changes Lab Results  Component Value Date   HGBA1C 6.2 10/21/2013   I will check again at follow up

## 2014-02-03 NOTE — Assessment & Plan Note (Signed)
I have personally reviewed the Medicare Annual Wellness questionnaire and have noted 1. The patient's medical and social history 2. Their use of alcohol, tobacco or illicit drugs 3. Their current medications and supplements 4. The patient's functional ability including ADL's, fall risks, home safety risks and hearing or visual             impairment. 5. Diet and physical activities 6. Evidence for depression or mood disorders  The patients weight, height, BMI and visual acuity have been recorded in the chart I have made referrals, counseling and provided education to the patient based review of the above and I have provided the pt with a written personalized care plan for preventive services.  I have provided you with a copy of your personalized plan for preventive services. Please take the time to review along with your updated medication list.  Will give flu shot today and yearly mammo every 2 years---due 2016 Doesn't want colonoscopy--will start with fecal immunoassay Update pneumovax and give prevnar after age 40 No paps due to age after discussion Needs to walk daily

## 2014-02-03 NOTE — Assessment & Plan Note (Signed)
See social history 

## 2014-02-03 NOTE — Assessment & Plan Note (Signed)
Severe on right, milder on left She will try regular ice and a brace Tylenol daily and ibuprofen prn Discussed cortisone shot--she will consider

## 2014-03-08 ENCOUNTER — Other Ambulatory Visit: Payer: Self-pay | Admitting: Internal Medicine

## 2014-03-08 NOTE — Telephone Encounter (Signed)
Okay #180 x 1 

## 2014-03-08 NOTE — Telephone Encounter (Signed)
Electronic Rx request for Ibuprofen received. Patient last office visit was 02/03/14. Medication has not been filled by you before.

## 2014-03-09 NOTE — Telephone Encounter (Signed)
Rx sent to pharmacy   

## 2014-03-19 ENCOUNTER — Other Ambulatory Visit: Payer: Self-pay

## 2014-05-26 ENCOUNTER — Encounter: Payer: Self-pay | Admitting: Internal Medicine

## 2014-05-26 ENCOUNTER — Ambulatory Visit (INDEPENDENT_AMBULATORY_CARE_PROVIDER_SITE_OTHER): Payer: Commercial Managed Care - HMO | Admitting: Internal Medicine

## 2014-05-26 VITALS — BP 150/88 | HR 99 | Temp 97.9°F | Resp 16 | Wt 249.0 lb

## 2014-05-26 DIAGNOSIS — J0110 Acute frontal sinusitis, unspecified: Secondary | ICD-10-CM | POA: Insufficient documentation

## 2014-05-26 MED ORDER — HYDROCODONE-HOMATROPINE 5-1.5 MG/5ML PO SYRP: 5.0000 mL | ORAL_SOLUTION | Freq: Every evening | ORAL | Status: DC | PRN

## 2014-05-26 MED ORDER — AMOXICILLIN 500 MG PO TABS: 1000.0000 mg | ORAL_TABLET | Freq: Two times a day (BID) | ORAL | Status: DC

## 2014-05-26 NOTE — Progress Notes (Signed)
Subjective:    Patient ID: Carrie [name], female    DOB: 1951-01-27, 63 y.o.   MRN: [id number]  HPI Here for evaluation of respiratory illness  Having a bad cough Yellow sputum Head is stopped up Coughing is keeping her up at night Started 2-3 days ago--but got worse yesterday SOB now Throat is painful  Head congestion and draining No fever No ear pain  Tried nyquil-- may have helped slightly  Current Outpatient Prescriptions on File Prior to Visit  Medication Sig Dispense Refill  . amLODipine (NORVASC) 10 MG tablet TAKE ONE TABLET BY MOUTH ONE TIME DAILY  90 tablet 1  . atorvastatin (LIPITOR) 40 MG tablet TAKE ONE TABLET BY MOUTH ONE TIME DAILY  30 tablet 10  . furosemide (LASIX) 40 MG tablet Take 40 mg by mouth daily as needed.     [name] ibuprofen (ADVIL,MOTRIN) 600 MG tablet TAKE ONE TABLET BY MOUTH TWICE DAILY  180 tablet 1  . levETIRAcetam (KEPPRA) 1000 MG tablet TAKE ONE TABLET BY MOUTH TWICE DAILY  60 tablet 10  . LORazepam (ATIVAN) 0.5 MG tablet Take 1 tablet (0.5 mg total) by mouth at bedtime as needed for anxiety. 30 tablet 0  . losartan-hydrochlorothiazide (HYZAAR) 50-12.5 MG per tablet Take 1 tablet by mouth daily. 90 tablet 3  . metFORMIN (GLUCOPHAGE) 500 MG tablet Take 1 tablet (500 mg total) by mouth 2 (two) times daily with a meal. 180 tablet 2  . Multiple Vitamin (MULTIVITAMIN) tablet Take 1 tablet by mouth daily.       No current facility-administered medications on file prior to visit.    Allergies  Allergen Reactions  . Naproxen Sodium     REACTION: unknown  . Sulfamethoxazole-Trimethoprim     REACTION: Unknown rxn    Past Medical History  Diagnosis Date  . Allergy   . Hypertension   . Meningioma     Foramen magnum  . Diabetes mellitus 2011    Past Surgical History  Procedure Laterality Date  . Cesarean section  1981  . Cervical discectomy  1996    Dr. Alric Seton  . Right wrist x-ray  2007    Deg. at 1st MCP  . 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 Hypertension Other     Strong family history     History   Social History  . Marital Status: Married    Spouse Name: N/A    Number of Children: 1  . Years of Education: N/A   Occupational History  . 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 Disabled since brain surgery    Social History Main Topics  . Smoking status: Never Smoker   . Smokeless tobacco: Never Used  . Alcohol Use: No  . Drug Use: Not on file  . Sexual Activity: Not on file   Other Topics Concern  . Not on file   Social History Narrative   Lives in family home with extended family.      No living will   Requests niece, [name], as health care POA   Would accept resuscitation attempts   No tube feeds if cognitively aware   Review of Systems  Red patch along left chin No vomiting or diarrhea Eating but doesn't taste much now     Objective:   Physical Exam  Constitutional: She appears well-developed. No distress.  HENT:  Mouth/Throat: Oropharynx is clear and  moist. No oropharyngeal exudate.  Mild frontal tenderness Marked nasal inflammation TMs fine   Neck: Normal range of motion. Neck supple. No thyromegaly present.  Pulmonary/Chest: Effort normal and breath sounds normal. No respiratory distress. She has no wheezes. She has no rales.  Lymphadenopathy:    She has no cervical adenopathy.          Assessment & Plan:

## 2014-05-26 NOTE — Assessment & Plan Note (Signed)
Given time course, still likely viral Will give cough syrup Discussed supportive care Amoxicillin if worsening

## 2014-05-26 NOTE — Progress Notes (Signed)
Pre visit review using our clinic review tool, if applicable. No additional management support is needed unless otherwise documented below in the visit note. 

## 2014-06-14 ENCOUNTER — Other Ambulatory Visit: Payer: Self-pay | Admitting: Internal Medicine

## 2014-06-17 ENCOUNTER — Ambulatory Visit (INDEPENDENT_AMBULATORY_CARE_PROVIDER_SITE_OTHER): Payer: Commercial Managed Care - HMO | Admitting: Internal Medicine

## 2014-06-17 ENCOUNTER — Encounter: Payer: Self-pay | Admitting: Internal Medicine

## 2014-06-17 VITALS — BP 138/80 | HR 88 | Temp 98.3°F | Ht 64.0 in | Wt 248.4 lb

## 2014-06-17 DIAGNOSIS — J0111 Acute recurrent frontal sinusitis: Secondary | ICD-10-CM

## 2014-06-17 MED ORDER — AMOXICILLIN-POT CLAVULANATE 875-125 MG PO TABS: 1.0000 | ORAL_TABLET | Freq: Two times a day (BID) | ORAL | Status: DC

## 2014-06-17 MED ORDER — PREDNISONE 20 MG PO TABS: 40.0000 mg | ORAL_TABLET | Freq: Every day | ORAL | Status: DC

## 2014-06-17 NOTE — Assessment & Plan Note (Signed)
Persistent--may be due to some obstruction from polyp Will try augmentin Restart flonase Prednisone burst for 5 days ---for polyp and lungs (mild degree of bronchospasm)

## 2014-06-17 NOTE — Patient Instructions (Signed)
Let me know next week if you are not improving Please restart the flonase and stay on it indefinitely

## 2014-06-17 NOTE — Progress Notes (Signed)
Subjective:    Patient ID: Carrie [name], female    DOB: 1951-01-20, 64 y.o.   MRN: [id number]  HPI Here with sister Langley Gauss due to ongoing respiratory illness  Did take the amoxil and cough med Still with bad cough, green nasal drainage and sputum (post nasal drip) Ice on neck did soothe her some Getting hot at night Some sweats but ??fever  Some SOB Wheezing some Cough is horrible--especially at night  Current Outpatient Prescriptions on File Prior to Visit  Medication Sig Dispense Refill  . amLODipine (NORVASC) 10 MG tablet TAKE ONE TABLET BY MOUTH ONE TIME DAILY  90 tablet 3  . amoxicillin (AMOXIL) 500 MG tablet Take 2 tablets (1,000 mg total) by mouth 2 (two) times daily. 40 tablet 0  . atorvastatin (LIPITOR) 40 MG tablet TAKE ONE TABLET BY MOUTH ONE TIME DAILY  30 tablet 10  . furosemide (LASIX) 40 MG tablet Take 40 mg by mouth daily as needed.     [name] HYDROcodone-homatropine (HYCODAN) 5-1.5 MG/5ML syrup Take 5 mLs by mouth at bedtime as needed for cough. 120 mL 0  . ibuprofen (ADVIL,MOTRIN) 600 MG tablet TAKE ONE TABLET BY MOUTH TWICE DAILY  180 tablet 1  . levETIRAcetam (KEPPRA) 1000 MG tablet TAKE ONE TABLET BY MOUTH TWICE DAILY  60 tablet 10  . LORazepam (ATIVAN) 0.5 MG tablet Take 1 tablet (0.5 mg total) by mouth at bedtime as needed for anxiety. 30 tablet 0  . losartan-hydrochlorothiazide (HYZAAR) 50-12.5 MG per tablet Take 1 tablet by mouth daily. 90 tablet 3  . metFORMIN (GLUCOPHAGE) 500 MG tablet Take 1 tablet (500 mg total) by mouth 2 (two) times daily with a meal. 180 tablet 2  . Multiple Vitamin (MULTIVITAMIN) tablet Take 1 tablet by mouth daily.       No current facility-administered medications on file prior to visit.    Allergies  Allergen Reactions  . Naproxen Sodium     REACTION: unknown  . Sulfamethoxazole-Trimethoprim     REACTION: Unknown rxn    Past Medical History  Diagnosis Date  . Allergy   . Hypertension   . Meningioma     Foramen magnum   . Diabetes mellitus 2011    Past Surgical History  Procedure Laterality Date  . Cesarean section  1981  . Cervical discectomy  1996    Dr. Alric Seton  . Right wrist x-ray  2007    Deg. at 1st MCP  . 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 Hypertension Other     Strong family history     History   Social History  . Marital Status: Married    Spouse Name: N/A    Number of Children: 1  . Years of Education: N/A   Occupational History  . 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 Disabled since brain surgery    Social History Main Topics  . Smoking status: Never Smoker   . Smokeless tobacco: Never Used  . Alcohol Use: No  . Drug Use: Not on file  . Sexual Activity: Not on file   Other Topics Concern  . Not on file   Social History Narrative   Lives in family home with extended family.      No living will   Requests niece, [name], as health care POA   Would accept resuscitation attempts   No tube feeds if cognitively  aware   Review of Systems No rash No vomiting or diarrhea    Objective:   Physical Exam  Constitutional: She appears well-nourished. No distress.  HENT:  Still with frontal and maxillary tenderness Marked nasal inflammation and polyp seen on right Slight pharyngeal injection TMs normal  Neck: Normal range of motion. Neck supple. No thyromegaly present.  Pulmonary/Chest: Effort normal. No respiratory distress. She has no rales.  Fair air movement Not tight but slight exp wheeze   Lymphadenopathy:    She has no cervical adenopathy.          Assessment & Plan:

## 2014-06-17 NOTE — Progress Notes (Signed)
Pre visit review using our clinic review tool, if applicable. No additional management support is needed unless otherwise documented below in the visit note. 

## 2014-06-22 ENCOUNTER — Telehealth: Payer: Self-pay | Admitting: Internal Medicine

## 2014-06-22 MED ORDER — LORAZEPAM 0.5 MG PO TABS: 0.5000 mg | ORAL_TABLET | Freq: Three times a day (TID) | ORAL | Status: DC | PRN

## 2014-06-22 NOTE — Telephone Encounter (Signed)
Patient's brother committed suicide on September 11, 2022 and her the anniversary of her daughter's death is coming up on 11-Sep-2022.  Patient wants to know if something can be called in for her nerves.  Patient uses [facility].

## 2014-06-22 NOTE — Telephone Encounter (Signed)
Please give refill of lorazepam but change to 1-2 tid prn for nerves #60 x 0  Let her know how sorry I am--they have been through so much!

## 2014-06-22 NOTE — Telephone Encounter (Signed)
Spoke with patient and advised results rx called into pharmacy  

## 2014-06-22 NOTE — Addendum Note (Signed)
Addended by: Despina Hidden on: 06/22/2014 04:02 PM   Modules accepted: Orders

## 2014-07-07 ENCOUNTER — Other Ambulatory Visit: Payer: Self-pay | Admitting: Internal Medicine

## 2014-07-26 ENCOUNTER — Other Ambulatory Visit: Payer: Self-pay | Admitting: Internal Medicine

## 2014-07-27 NOTE — Telephone Encounter (Signed)
Spoke to patient and was advised that she does not need a refill on the Amoxicillin. Patient stated that she is doing okay and may have ordered this in error while requesting refills on other medications. Advised patient to call back if her symptoms return and she verbalized understanding.

## 2014-07-27 NOTE — Telephone Encounter (Signed)
Received refill request electronically. Last refill and last office visit 06/17/14 #20. Is it okay to refill medication?

## 2014-07-27 NOTE — Telephone Encounter (Signed)
Approved:if she improved but then relapsed a little, okay to refill #20 x 0

## 2014-08-04 ENCOUNTER — Encounter: Payer: Self-pay | Admitting: Internal Medicine

## 2014-08-04 ENCOUNTER — Ambulatory Visit (INDEPENDENT_AMBULATORY_CARE_PROVIDER_SITE_OTHER): Payer: Commercial Managed Care - HMO | Admitting: Internal Medicine

## 2014-08-04 VITALS — BP 140/88 | HR 92 | Temp 98.2°F | Wt 241.1 lb

## 2014-08-04 DIAGNOSIS — M542 Cervicalgia: Secondary | ICD-10-CM | POA: Diagnosis not present

## 2014-08-04 DIAGNOSIS — E7800 Pure hypercholesterolemia, unspecified: Secondary | ICD-10-CM

## 2014-08-04 DIAGNOSIS — E119 Type 2 diabetes mellitus without complications: Secondary | ICD-10-CM

## 2014-08-04 DIAGNOSIS — I10 Essential (primary) hypertension: Secondary | ICD-10-CM

## 2014-08-04 LAB — CBC WITH DIFFERENTIAL/PLATELET
BASOS ABS: 0 10*3/uL (ref 0.0–0.1)
Basophils Relative: 0.4 % (ref 0.0–3.0)
Eosinophils Absolute: 0.2 10*3/uL (ref 0.0–0.7)
Eosinophils Relative: 2.6 % (ref 0.0–5.0)
HEMATOCRIT: 32.8 % — AB (ref 36.0–46.0)
Hemoglobin: 10.8 g/dL — ABNORMAL LOW (ref 12.0–15.0)
LYMPHS ABS: 2.6 10*3/uL (ref 0.7–4.0)
Lymphocytes Relative: 35.5 % (ref 12.0–46.0)
MCHC: 33 g/dL (ref 30.0–36.0)
MCV: 81.8 fl (ref 78.0–100.0)
MONO ABS: 0.4 10*3/uL (ref 0.1–1.0)
Monocytes Relative: 5.5 % (ref 3.0–12.0)
NEUTROS ABS: 4.1 10*3/uL (ref 1.4–7.7)
Neutrophils Relative %: 56 % (ref 43.0–77.0)
Platelets: 387 10*3/uL (ref 150.0–400.0)
RBC: 4.01 Mil/uL (ref 3.87–5.11)
RDW: 16.7 % — AB (ref 11.5–15.5)
WBC: 7.3 10*3/uL (ref 4.0–10.5)

## 2014-08-04 LAB — COMPREHENSIVE METABOLIC PANEL
ALT: 8 U/L (ref 0–35)
AST: 11 U/L (ref 0–37)
Albumin: 4.3 g/dL (ref 3.5–5.2)
Alkaline Phosphatase: 107 U/L (ref 39–117)
BUN: 21 mg/dL (ref 6–23)
CO2: 29 meq/L (ref 19–32)
Calcium: 10 mg/dL (ref 8.4–10.5)
Chloride: 104 mEq/L (ref 96–112)
Creatinine, Ser: 1.36 mg/dL — ABNORMAL HIGH (ref 0.40–1.20)
GFR: 50.12 mL/min — ABNORMAL LOW (ref >60.00)
GLUCOSE: 96 mg/dL (ref 70–99)
Potassium: 3.5 mEq/L (ref 3.5–5.1)
Sodium: 140 mEq/L (ref 135–145)
TOTAL PROTEIN: 8.6 g/dL — AB (ref 6.0–8.3)
Total Bilirubin: 0.3 mg/dL (ref 0.2–1.2)

## 2014-08-04 LAB — HM DIABETES FOOT EXAM

## 2014-08-04 LAB — HEMOGLOBIN A1C: Hgb A1c MFr Bld: 6 % (ref 4.6–6.5)

## 2014-08-04 LAB — LIPID PANEL
Cholesterol: 192 mg/dL (ref 0–200)
HDL: 51.6 mg/dL (ref >39.00)
LDL Cholesterol: 121 mg/dL — ABNORMAL HIGH (ref 0–99)
NonHDL: 140.4
TRIGLYCERIDES: 95 mg/dL (ref 0.0–149.0)
Total CHOL/HDL Ratio: 4
VLDL: 19 mg/dL (ref 0.0–40.0)

## 2014-08-04 LAB — T4, FREE: FREE T4: 0.84 ng/dL (ref 0.60–1.60)

## 2014-08-04 MED ORDER — IBUPROFEN 600 MG PO TABS: ORAL_TABLET | ORAL | Status: DC

## 2014-08-04 MED ORDER — LEVETIRACETAM 1000 MG PO TABS: 1000.0000 mg | ORAL_TABLET | Freq: Two times a day (BID) | ORAL | Status: DC

## 2014-08-04 NOTE — Assessment & Plan Note (Signed)
No problems with statin Due for labs 

## 2014-08-04 NOTE — Assessment & Plan Note (Signed)
Hopefully still good control Due for labs 

## 2014-08-04 NOTE — Assessment & Plan Note (Signed)
Better Will change the ibuprofen to prn only

## 2014-08-04 NOTE — Progress Notes (Signed)
Subjective:    Patient ID: Carrie [name], female    DOB: February 16, 1951, 64 y.o.   MRN: [id number]  HPI Here for follow up of diabetes and other medical conditions Still trying to get dental appt--her [facility] does have dental coverage  Not really checking sugars Still tries to be careful with diet Will be setting up eye appt for next week No numbness, sores or pain in feet  No chest pain Had 1 day of SOB---related to walking a lot during sinus infection. Otherwise breathing is good No dizziness or syncope No edema  Uses the ibuprofen bid Still has neck pain related to surgery in 2008 Discussed using this only prn  Current Outpatient Prescriptions on File Prior to Visit  Medication Sig Dispense Refill  . amLODipine (NORVASC) 10 MG tablet TAKE ONE TABLET BY MOUTH ONE TIME DAILY  90 tablet 3  . amoxicillin-clavulanate (AUGMENTIN) 875-125 MG per tablet Take 1 tablet by mouth 2 (two) times daily. 20 tablet 0  . atorvastatin (LIPITOR) 40 MG tablet TAKE ONE TABLET BY MOUTH ONE TIME DAILY  30 tablet 10  . furosemide (LASIX) 40 MG tablet Take 40 mg by mouth daily as needed.     [name] ibuprofen (ADVIL,MOTRIN) 600 MG tablet TAKE ONE TABLET BY MOUTH TWICE DAILY  180 tablet 1  . levETIRAcetam (KEPPRA) 1000 MG tablet TAKE ONE TABLET BY MOUTH TWICE DAILY  60 tablet 10  . LORazepam (ATIVAN) 0.5 MG tablet Take 1-2 tablets (0.5-1 mg total) by mouth 3 (three) times daily as needed (nerves). 60 tablet 0  . losartan-hydrochlorothiazide (HYZAAR) 50-12.5 MG per tablet Take 1 tablet by mouth daily. 90 tablet 3  . metFORMIN (GLUCOPHAGE) 500 MG tablet TAKE ONE TABLET BY MOUTH TWICE A DAY WITH MEALS  180 tablet 1  . Multiple Vitamin (MULTIVITAMIN) tablet Take 1 tablet by mouth daily.      . predniSONE (DELTASONE) 20 MG tablet Take 2 tablets (40 mg total) by mouth daily. 10 tablet 0   No current facility-administered medications on file prior to visit.    Allergies  Allergen Reactions  . Naproxen Sodium     REACTION: unknown  . Sulfamethoxazole-Trimethoprim     REACTION: Unknown rxn    Past Medical History  Diagnosis Date  . Allergy   . Hypertension   . Meningioma     Foramen magnum  . Diabetes mellitus 2011    Past Surgical History  Procedure Laterality Date  . Cesarean section  1981  . Cervical discectomy  1996    Dr. Alric Seton  . Right wrist x-ray  2007    Deg. at 1st MCP  . 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 Hypertension Other     Strong family history     History   Social History  . Marital Status: Married    Spouse Name: N/A  . Number of Children: 1  . Years of Education: N/A   Occupational History  . 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 Disabled since brain surgery    Social History Main Topics  . Smoking status: Never Smoker   . Smokeless tobacco: Never Used  . Alcohol Use: No  . Drug Use: Not on file  . Sexual Activity: Not on file   Other Topics Concern  . Not on file   Social History Narrative   Lives in family home with extended family.  No living will   Requests niece, [name], as health care POA   Would accept resuscitation attempts   No tube feeds if cognitively aware   Review of Systems  Still not sleeping well. Tried melatonin without much effect Weight is down 7# since her last visit Watches adopted grandson from church (61 months old)     Objective:   Physical Exam  Constitutional: She appears well-developed and well-nourished. No distress.  Neck: Normal range of motion. Neck supple. No thyromegaly present.  Cardiovascular: Normal rate, regular rhythm, normal heart sounds and intact distal pulses.  Exam reveals no gallop.   No murmur heard. Pulmonary/Chest: Effort normal and breath sounds normal. No respiratory distress. She has no wheezes. She has no rales.  Musculoskeletal: She exhibits no edema or tenderness.  Lymphadenopathy:    She has no  cervical adenopathy.  Neurological:  Normal fine touch sensation in feet  Skin:  No foot lesions  Psychiatric: She has a normal mood and affect. Her behavior is normal.          Assessment & Plan:

## 2014-08-04 NOTE — Progress Notes (Signed)
Pre visit review using our clinic review tool, if applicable. No additional management support is needed unless otherwise documented below in the visit note. 

## 2014-08-04 NOTE — Assessment & Plan Note (Signed)
BP Readings from Last 3 Encounters:  08/04/14 140/88  06/17/14 138/80  05/26/14 150/88   Good control Will limit the ibuprofen

## 2014-08-09 ENCOUNTER — Ambulatory Visit (INDEPENDENT_AMBULATORY_CARE_PROVIDER_SITE_OTHER): Payer: Commercial Managed Care - HMO | Admitting: Internal Medicine

## 2014-08-09 ENCOUNTER — Ambulatory Visit (INDEPENDENT_AMBULATORY_CARE_PROVIDER_SITE_OTHER)
Admission: RE | Admit: 2014-08-09 | Discharge: 2014-08-09 | Disposition: A | Discharge status: Home / Self Care | Payer: Commercial Managed Care - HMO | Source: Ambulatory Visit | Attending: Internal Medicine | Admitting: Internal Medicine

## 2014-08-09 ENCOUNTER — Encounter: Payer: Self-pay | Admitting: Internal Medicine

## 2014-08-09 VITALS — BP 144/82 | HR 92 | Temp 98.0°F | Wt 247.5 lb

## 2014-08-09 DIAGNOSIS — M542 Cervicalgia: Secondary | ICD-10-CM

## 2014-08-09 DIAGNOSIS — S134XXA Sprain of ligaments of cervical spine, initial encounter: Secondary | ICD-10-CM | POA: Diagnosis not present

## 2014-08-09 DIAGNOSIS — M4322 Fusion of spine, cervical region: Secondary | ICD-10-CM | POA: Diagnosis not present

## 2014-08-09 DIAGNOSIS — M5031 Other cervical disc degeneration,  high cervical region: Secondary | ICD-10-CM | POA: Diagnosis not present

## 2014-08-09 DIAGNOSIS — M5032 Other cervical disc degeneration, mid-cervical region: Secondary | ICD-10-CM | POA: Diagnosis not present

## 2014-08-09 MED ORDER — METHOCARBAMOL 500 MG PO TABS: 500.0000 mg | ORAL_TABLET | Freq: Three times a day (TID) | ORAL | Status: DC | PRN
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
## 2014-08-09 NOTE — Progress Notes (Signed)
Pre visit review using our clinic review tool, if applicable. No additional management support is needed unless otherwise documented below in the visit note. 

## 2014-08-09 NOTE — Patient Instructions (Signed)

## 2014-09-13 ENCOUNTER — Other Ambulatory Visit: Payer: Self-pay | Admitting: Internal Medicine

## 2014-09-20 ENCOUNTER — Ambulatory Visit
Admit: 2014-09-20 | Disposition: A | Discharge status: Home / Self Care | Payer: Self-pay | Attending: Internal Medicine | Admitting: Internal Medicine

## 2014-09-20 ENCOUNTER — Encounter: Payer: Self-pay | Admitting: Internal Medicine

## 2014-09-20 DIAGNOSIS — Z1231 Encounter for screening mammogram for malignant neoplasm of breast: Secondary | ICD-10-CM | POA: Diagnosis not present

## 2014-09-20 LAB — HM MAMMOGRAPHY: HM MAMMO: NORMAL (ref 0–4)

## 2014-10-07 DIAGNOSIS — H5210 Myopia, unspecified eye: Secondary | ICD-10-CM | POA: Diagnosis not present

## 2014-10-07 DIAGNOSIS — H524 Presbyopia: Secondary | ICD-10-CM | POA: Diagnosis not present

## 2014-10-11 LAB — HM DIABETES EYE EXAM

## 2014-10-12 DIAGNOSIS — H35013 Changes in retinal vascular appearance, bilateral: Secondary | ICD-10-CM | POA: Diagnosis not present

## 2014-10-19 ENCOUNTER — Other Ambulatory Visit: Payer: Self-pay | Admitting: Internal Medicine

## 2014-11-29 ENCOUNTER — Other Ambulatory Visit: Payer: Self-pay | Admitting: Internal Medicine

## 2014-11-29 ENCOUNTER — Other Ambulatory Visit: Payer: Self-pay

## 2014-11-29 NOTE — Telephone Encounter (Signed)
06/22/2014 

## 2014-11-30 MED ORDER — LORAZEPAM 0.5 MG PO TABS: 0.5000 mg | ORAL_TABLET | Freq: Three times a day (TID) | ORAL | Status: DC | PRN

## 2014-11-30 NOTE — Telephone Encounter (Signed)
rx called into pharmacy

## 2014-11-30 NOTE — Telephone Encounter (Signed)
Approved: Okay #60 x 0

## 2015-01-24 ENCOUNTER — Other Ambulatory Visit: Payer: Self-pay | Admitting: Family Medicine

## 2015-01-24 DIAGNOSIS — E119 Type 2 diabetes mellitus without complications: Secondary | ICD-10-CM

## 2015-01-27 ENCOUNTER — Other Ambulatory Visit: Payer: Self-pay

## 2015-01-27 MED ORDER — METFORMIN HCL 500 MG PO TABS: 500.0000 mg | ORAL_TABLET | Freq: Two times a day (BID) | ORAL | Status: DC

## 2015-01-27 NOTE — Telephone Encounter (Signed)
Pt request refill metformin to [facility]. Per protocol refill done.

## 2015-04-01 ENCOUNTER — Ambulatory Visit (INDEPENDENT_AMBULATORY_CARE_PROVIDER_SITE_OTHER): Payer: Commercial Managed Care - HMO | Admitting: Internal Medicine

## 2015-04-01 ENCOUNTER — Encounter: Payer: Self-pay | Admitting: Internal Medicine

## 2015-04-01 VITALS — BP 132/76 | HR 88 | Temp 97.3°F | Ht 63.0 in | Wt 239.2 lb

## 2015-04-01 DIAGNOSIS — F39 Unspecified mood [affective] disorder: Secondary | ICD-10-CM | POA: Insufficient documentation

## 2015-04-01 DIAGNOSIS — I10 Essential (primary) hypertension: Secondary | ICD-10-CM

## 2015-04-01 DIAGNOSIS — Z23 Encounter for immunization: Secondary | ICD-10-CM

## 2015-04-01 DIAGNOSIS — Z0000 Encounter for general adult medical examination without abnormal findings: Secondary | ICD-10-CM | POA: Diagnosis not present

## 2015-04-01 DIAGNOSIS — Z6841 Body Mass Index (BMI) 40.0 and over, adult: Secondary | ICD-10-CM

## 2015-04-01 DIAGNOSIS — M1711 Unilateral primary osteoarthritis, right knee: Secondary | ICD-10-CM

## 2015-04-01 DIAGNOSIS — E119 Type 2 diabetes mellitus without complications: Secondary | ICD-10-CM | POA: Diagnosis not present

## 2015-04-01 DIAGNOSIS — Z7189 Other specified counseling: Secondary | ICD-10-CM

## 2015-04-01 LAB — HM DIABETES FOOT EXAM

## 2015-04-01 LAB — HEMOGLOBIN A1C: HEMOGLOBIN A1C: 6.1 % (ref 4.6–6.5)

## 2015-04-01 MED ORDER — LORAZEPAM 0.5 MG PO TABS: 0.5000 mg | ORAL_TABLET | Freq: Three times a day (TID) | ORAL | Status: DC | PRN

## 2015-04-01 MED ORDER — TRAMADOL HCL 50 MG PO TABS: 25.0000 mg | ORAL_TABLET | Freq: Two times a day (BID) | ORAL | Status: DC | PRN

## 2015-04-01 NOTE — Assessment & Plan Note (Signed)
Severe pain at times-- ibuprofen not enough Will Rx tramadol for prn

## 2015-04-01 NOTE — Progress Notes (Signed)
Pre visit review using our clinic review tool, if applicable. No additional management support is needed unless otherwise documented below in the visit note. 

## 2015-04-01 NOTE — Progress Notes (Signed)
Subjective:    Patient ID: Carrie [name], female    DOB: August 25, 1950, 64 y.o.   MRN: [id number]  HPI Here for Medicare wellness and follow up of chronic medical conditions Reviewed form and advanced directives Reviewed other doctors Here with sister---lives in family home No tobacco or alcohol No exercise---discussed No falls No depression regularly---down day once a month or so. Not anhedonic Independent with with instrumental ADLs Vision and hearing are fine No apparent change in memory  Doing well Ongoing neck problems---but dealing with it Ibuprofen 600mg  not helping much. Only takes it once in a while  Not checking sugars No hypoglycemic reactions Recent eye exam was fine No sores, numbness or pain in feet  No chest pain No palpitations No dizziness or syncope No edema No SOB  No seizures On medicine since brain surgery for prevention  Will have some emotional times Deaths in family Stress and mild anxiety Lorazepam will help this---and if can't sleep  No myalgia or GI problems on the statin  Current Outpatient Prescriptions on File Prior to Visit  Medication Sig Dispense Refill  . amLODipine (NORVASC) 10 MG tablet TAKE ONE TABLET BY MOUTH ONE TIME DAILY  90 tablet 3  . atorvastatin (LIPITOR) 40 MG tablet TAKE ONE TABLET BY MOUTH ONE TIME DAILY  30 tablet 9  . furosemide (LASIX) 40 MG tablet Take 40 mg by mouth daily as needed.     [name] ibuprofen (ADVIL,MOTRIN) 600 MG tablet TAKE ONE TABLET BY MOUTH TWICE DAILY AS NEEDED FOR SEVERE NECK PAIN 180 tablet 1  . levETIRAcetam (KEPPRA) 1000 MG tablet Take 1 tablet (1,000 mg total) by mouth 2 (two) times daily. 180 tablet 3  . LORazepam (ATIVAN) 0.5 MG tablet Take 1-2 tablets (0.5-1 mg total) by mouth 3 (three) times daily as needed (nerves). 60 tablet 0  . losartan-hydrochlorothiazide (HYZAAR) 50-12.5 MG per tablet TAKE ONE TABLET BY MOUTH ONE TIME DAILY 90 tablet 3  . metFORMIN (GLUCOPHAGE) 500 MG tablet Take 1  tablet (500 mg total) by mouth 2 (two) times daily with a meal. 180 tablet 1  . methocarbamol (ROBAXIN) 500 MG tablet Take 1 tablet (500 mg total) by mouth every 8 (eight) hours as needed for muscle spasms. 30 tablet 0  . Multiple Vitamin (MULTIVITAMIN) tablet Take 1 tablet by mouth daily.       No current facility-administered medications on file prior to visit.    Allergies  Allergen Reactions  . Naproxen Sodium     REACTION: unknown  . Sulfamethoxazole-Trimethoprim     REACTION: Unknown rxn    Past Medical History  Diagnosis Date  . Allergy   . Hypertension   . Meningioma ([place])     Foramen magnum  . Diabetes mellitus 2011    Past Surgical History  Procedure Laterality Date  . Cesarean section  1981  . Cervical discectomy  1996    Dr. Alric Seton  . Right wrist x-ray  2007    Deg. at 1st MCP  . 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 Hypertension Other     Strong family history     Social History   Social History  . Marital Status: Married    Spouse Name: N/A  . Number of Children: 1  . Years of Education: N/A   Occupational History  . 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 Disabled since brain  surgery    Social History Main Topics  . Smoking status: Never Smoker   . Smokeless tobacco: Never Used  . Alcohol Use: No  . Drug Use: Not on file  . Sexual Activity: Not on file   Other Topics Concern  . Not on file   Social History Narrative   Lives in family home with extended family.      No living will   Requests niece, [name], as health care POA   Would accept resuscitation attempts   No tube feeds if cognitively aware   Review of Systems Had 6 lower feet pulled---will get partial later. Has her own on top Appetite is good Weight is down a few pounds---stays active chasing grandson (keeps him half the time) Sleeps okay--not great. No daytime somnolence Bowels are fine No  urinary problems No rashes or suspicious skin lesions     Objective:   Physical Exam  Constitutional: She is oriented to person, place, and time. She appears well-developed and well-nourished. No distress.  HENT:  Mouth/Throat: Oropharynx is clear and moist. No oropharyngeal exudate.  Neck: Normal range of motion. Neck supple.  Cardiovascular: Normal rate, regular rhythm, normal heart sounds and intact distal pulses.  Exam reveals no gallop.   No murmur heard. Pulmonary/Chest: Effort normal and breath sounds normal. No respiratory distress. She has no wheezes. She has no rales.  Abdominal: Soft. There is no tenderness.  Musculoskeletal: She exhibits no edema or tenderness.  Neurological: She is alert and oriented to person, place, and time.  President-- "Elyn Peers, [place]" [phone] [id number] Recall 3/3  Normal sensation in feet  Skin: No rash noted. No erythema.  No foot lesions  Psychiatric: She has a normal mood and affect. Her behavior is normal.          Assessment & Plan:

## 2015-04-01 NOTE — Addendum Note (Signed)
Addended by: Tammi Sou on: 04/01/2015 12:22 PM   Modules accepted: Orders

## 2015-04-01 NOTE — Assessment & Plan Note (Addendum)
I have personally reviewed the Medicare Annual Wellness questionnaire and have noted 1. The patient's medical and social history 2. Their use of alcohol, tobacco or illicit drugs 3. Their current medications and supplements 4. The patient's functional ability including ADL's, fall risks, home safety risks and hearing or visual             impairment. 5. Diet and physical activities 6. Evidence for depression or mood disorders  The patients weight, height, BMI and visual acuity have been recorded in the chart I have made referrals, counseling and provided education to the patient based review of the above and I have provided the pt with a written personalized care plan for preventive services.  I have provided you with a copy of your personalized plan for preventive services. Please take the time to review along with your updated medication list.  Flu and prevnar today Will set up colonoscopy UTD on mammo-- every 2 years No pap due to age Prefers to hold off on zostavax

## 2015-04-01 NOTE — Assessment & Plan Note (Signed)
BP Readings from Last 3 Encounters:  04/01/15 132/76  08/09/14 144/82  08/04/14 140/88   Good control

## 2015-04-01 NOTE — Assessment & Plan Note (Signed)
Mostly episodic anxiety and grieving Uses the lorazepam occasionally--for sleep also

## 2015-04-01 NOTE — Assessment & Plan Note (Signed)
Hopefully still good control Will check A1c

## 2015-04-01 NOTE — Assessment & Plan Note (Signed)
Has lost some weight with eating better Discussed exercise

## 2015-04-01 NOTE — Assessment & Plan Note (Signed)
See social history Blank forms done 

## 2015-07-09 ENCOUNTER — Other Ambulatory Visit: Payer: Self-pay | Admitting: Internal Medicine

## 2015-07-11 ENCOUNTER — Other Ambulatory Visit: Payer: Self-pay | Admitting: Internal Medicine

## 2015-07-11 NOTE — Telephone Encounter (Signed)
Approved: tramadol #60 x 0 Ibuprofen #200 x 11

## 2015-07-11 NOTE — Telephone Encounter (Signed)
rx called into pharmacy rx sent to pharmacy by e-script  

## 2015-07-11 NOTE — Telephone Encounter (Signed)
04/01/2015 

## 2015-08-18 ENCOUNTER — Other Ambulatory Visit: Payer: Self-pay | Admitting: Internal Medicine

## 2015-08-18 NOTE — Telephone Encounter (Signed)
Rx sent electronically.  

## 2015-09-21 ENCOUNTER — Ambulatory Visit (INDEPENDENT_AMBULATORY_CARE_PROVIDER_SITE_OTHER): Payer: Commercial Managed Care - HMO | Admitting: Family Medicine

## 2015-09-21 ENCOUNTER — Encounter: Payer: Self-pay | Admitting: Family Medicine

## 2015-09-21 VITALS — BP 130/82 | HR 93 | Temp 98.3°F | Wt 237.0 lb

## 2015-09-21 DIAGNOSIS — M542 Cervicalgia: Secondary | ICD-10-CM | POA: Diagnosis not present

## 2015-09-21 MED ORDER — TRAMADOL HCL 50 MG PO TABS: ORAL_TABLET | ORAL | Status: DC

## 2015-09-21 MED ORDER — LORAZEPAM 0.5 MG PO TABS: 0.5000 mg | ORAL_TABLET | Freq: Three times a day (TID) | ORAL | Status: DC | PRN

## 2015-09-21 MED ORDER — METHOCARBAMOL 500 MG PO TABS: 500.0000 mg | ORAL_TABLET | Freq: Two times a day (BID) | ORAL | Status: DC | PRN

## 2015-09-21 NOTE — Patient Instructions (Addendum)
I think you have bad cervical neck strain/spasm. Treat with continued ibuprofen 600mg  twice daily with meals for 3 days, robaxin muscle relaxant twice daily as needed, and heating pad to right neck as needed.  Tramadol refilled.  Ativan refilled.  Let us know if not improving for further evaluation/treatment.  Consider starting claritin for allergic symptoms.

## 2015-09-21 NOTE — Progress Notes (Signed)
Pre visit review using our clinic review tool, if applicable. No additional management support is needed unless otherwise documented below in the visit note. 

## 2015-09-21 NOTE — Progress Notes (Signed)
BP 130/82 mmHg  Pulse 93  Temp(Src) 98.3 F (36.8 C) (Oral)  Wt 237 lb (107.502 kg)  SpO2 97%   CC: neck pain, ear pain, requests med refills  Subjective:    Patient ID: Carrie [name], female    DOB: [birth year], 65 y.o.   MRN: [id number]  HPI: Carrie [name] is a 65 y.o. female presenting on 09/21/2015 for Neck Pain; Ear Pain; and Medication Refill   Presents with middle sister.  Sunday afternoon started noticing worsening neck pain, head soreness, yesterday R ear started hurting along with dizziness. Now with limited head rotation bilaterally (more than normal). Denies inciting trauma/falls. Has treated with ibuprofen which helps some. Some nasal congestion and clear rhinorrhea.   No fevers/chills, vision changes, ringing in ears or hearing loss, nausea, ST, PNdrainage. No headaches.  H/o foramen magnum meningioma tumor s/p excision 2008. On ppx seizure med since.  H/o chronic neck pain.   On tramadol and lorazepam - for knee osteoarthritis and anxiety, requests refills. Upcoming husband's anniversary.  Relevant past medical, surgical, family and social history reviewed and updated as indicated. Interim medical history since our last visit reviewed. Allergies and medications reviewed and updated. Current Outpatient Prescriptions on File Prior to Visit  Medication Sig  . amLODipine (NORVASC) 10 MG tablet TAKE ONE TABLET BY MOUTH ONE TIME DAILY   . atorvastatin (LIPITOR) 40 MG tablet TAKE ONE TABLET BY MOUTH ONE TIME DAILY  . furosemide (LASIX) 40 MG tablet Take 40 mg by mouth daily as needed.   [name] ibuprofen (ADVIL,MOTRIN) 600 MG tablet TAKE ONE TABLET BY MOUTH TWICE DAILY AS NEEDED FOR SEVERE NECK PAIN  . levETIRAcetam (KEPPRA) 1000 MG tablet TAKE ONE TABLET BY MOUTH TWICE DAILY  . losartan-hydrochlorothiazide (HYZAAR) 50-12.5 MG per tablet TAKE ONE TABLET BY MOUTH ONE TIME DAILY  . metFORMIN (GLUCOPHAGE) 500 MG tablet TAKE 1 TABLET (500 MG TOTAL) BY MOUTH 2 (TWO) TIMES  DAILY WITH A MEAL.  . Multiple Vitamin (MULTIVITAMIN) tablet Take 1 tablet by mouth daily.     No current facility-administered medications on file prior to visit.   Patient Active Problem List   Diagnosis Date Noted  . Mood disorder ([place]) 04/01/2015  . Dental infection 02/03/2014  . Osteoarthritis of right knee 02/03/2014  . Neck pain 02/03/2014  . Advanced directives, counseling/discussion 02/03/2014  . Routine general medical examination at a health care facility 07/08/2012  . BMI 40.0-44.9, adult ([place]) 07/08/2012  . Diabetes mellitus type 2, controlled, without complications ([place])   . COMMON MIGRAINE 09/27/2006  . HYPERCHOLESTEROLEMIA 09/26/2006  . Essential hypertension, benign 09/26/2006  . ALLERGIC RHINITIS 09/26/2006  . MENINGIOMA 08/03/2006    Past Medical History  Diagnosis Date  . Allergy   . Hypertension   . Meningioma ([place])     Foramen magnum  . Diabetes mellitus 2011    Past Surgical History  Procedure Laterality Date  . Cesarean section  1981  . Cervical discectomy  1996    Dr. Alric Seton  . Right wrist x-ray  2007    Deg. at 1st MCP  . 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 Smoking status: Never Smoker   . Smokeless tobacco: Never Used  . Alcohol Use: No    Family History  Problem Relation Age of Onset  . Hypertension Other     Strong family history     Review of Systems Per HPI  unless specifically indicated in ROS section     Objective:    BP 130/82 mmHg  Pulse 93  Temp(Src) 98.3 F (36.8 C) (Oral)  Wt 237 lb (107.502 kg)  SpO2 97%  Wt Readings from Last 3 Encounters:  09/21/15 237 lb (107.502 kg)  04/01/15 239 lb 4 oz (108.523 kg)  08/09/14 247 lb 8 oz (112.265 kg)    Physical Exam  Constitutional: She appears well-developed and well-nourished. No distress.  HENT:  Head: Normocephalic and atraumatic.  Right Ear: Hearing, tympanic membrane, external ear and ear canal  normal.  Left Ear: Hearing, tympanic membrane, external ear and ear canal normal.  Nose: Mucosal edema present. No rhinorrhea. Right sinus exhibits no maxillary sinus tenderness and no frontal sinus tenderness. Left sinus exhibits no maxillary sinus tenderness and no frontal sinus tenderness.  Mouth/Throat: Uvula is midline, oropharynx is clear and moist and mucous membranes are normal. No oropharyngeal exudate, posterior oropharyngeal edema, posterior oropharyngeal erythema or tonsillar abscesses.  TMs clear Pale boggy turbinates  Eyes: Conjunctivae and EOM are normal. Pupils are equal, round, and reactive to light. No scleral icterus.  Neck: Neck supple.  Chronic mild left torticollis at baseline, more pronounced today. Swelling/stiffness of right neck at SCM/trapezius/scalenes tender to palpation without erythema or caor Mild discomfort to palpation L trapezius as well No midline cervical spine tenderness  Cardiovascular: Normal rate, regular rhythm, normal heart sounds and intact distal pulses.   No murmur heard. Pulmonary/Chest: Effort normal and breath sounds normal. No respiratory distress. She has no wheezes. She has no rales.  Musculoskeletal: She exhibits no edema.  Lymphadenopathy:    She has no cervical adenopathy.  Skin: Skin is warm and dry. No rash noted.  Psychiatric: She has a normal mood and affect.  Nursing note and vitals reviewed.     Assessment & Plan:  Tramadol/ativan refilled today. Problem List Items Addressed This Visit    Neck pain - Primary    Anticipate marked cervical neck strain/spasm - rec regular ibuprofen 600mg  bid with meal x 3-5 days, will also refill tramadol. rec heating pad and short robaxin course BID PRN.  No signs of infection today - no erythema or warmth of neck, TM looks ok. Red flags to return for further care discussed. Update if not improving over next few days. Pt and sister agree with plan.      Relevant Medications   methocarbamol  (ROBAXIN) 500 MG tablet       Follow up plan: Return if symptoms worsen or fail to improve.  Ria Bush, MD

## 2015-09-21 NOTE — Assessment & Plan Note (Signed)
Anticipate marked cervical neck strain/spasm - rec regular ibuprofen 600mg  bid with meal x 3-5 days, will also refill tramadol. rec heating pad and short robaxin course BID PRN.  No signs of infection today - no erythema or warmth of neck, TM looks ok. Red flags to return for further care discussed. Update if not improving over next few days. Pt and sister agree with plan.

## 2015-09-30 ENCOUNTER — Other Ambulatory Visit: Payer: Self-pay | Admitting: Internal Medicine

## 2015-09-30 ENCOUNTER — Encounter: Payer: Self-pay | Admitting: Internal Medicine

## 2015-09-30 ENCOUNTER — Ambulatory Visit (INDEPENDENT_AMBULATORY_CARE_PROVIDER_SITE_OTHER): Payer: Commercial Managed Care - HMO | Admitting: Internal Medicine

## 2015-09-30 VITALS — BP 122/84 | HR 91 | Temp 98.2°F | Wt 228.0 lb

## 2015-09-30 DIAGNOSIS — E119 Type 2 diabetes mellitus without complications: Secondary | ICD-10-CM

## 2015-09-30 DIAGNOSIS — Z6841 Body Mass Index (BMI) 40.0 and over, adult: Secondary | ICD-10-CM

## 2015-09-30 DIAGNOSIS — N183 Chronic kidney disease, stage 3 unspecified: Secondary | ICD-10-CM

## 2015-09-30 DIAGNOSIS — Z1211 Encounter for screening for malignant neoplasm of colon: Secondary | ICD-10-CM

## 2015-09-30 DIAGNOSIS — F39 Unspecified mood [affective] disorder: Secondary | ICD-10-CM

## 2015-09-30 DIAGNOSIS — I10 Essential (primary) hypertension: Secondary | ICD-10-CM | POA: Diagnosis not present

## 2015-09-30 DIAGNOSIS — D320 Benign neoplasm of cerebral meninges: Secondary | ICD-10-CM | POA: Diagnosis not present

## 2015-09-30 LAB — CBC WITH DIFFERENTIAL/PLATELET
BASOS ABS: 0 10*3/uL (ref 0.0–0.1)
BASOS PCT: 0.4 % (ref 0.0–3.0)
EOS ABS: 0.1 10*3/uL (ref 0.0–0.7)
Eosinophils Relative: 2.1 % (ref 0.0–5.0)
HEMATOCRIT: 33.5 % — AB (ref 36.0–46.0)
Hemoglobin: 11.1 g/dL — ABNORMAL LOW (ref 12.0–15.0)
LYMPHS ABS: 2.4 10*3/uL (ref 0.7–4.0)
LYMPHS PCT: 33.8 % (ref 12.0–46.0)
MCHC: 33.1 g/dL (ref 30.0–36.0)
MCV: 80.3 fl (ref 78.0–100.0)
Monocytes Absolute: 0.4 10*3/uL (ref 0.1–1.0)
Monocytes Relative: 6.3 % (ref 3.0–12.0)
NEUTROS ABS: 4 10*3/uL (ref 1.4–7.7)
NEUTROS PCT: 57.4 % (ref 43.0–77.0)
PLATELETS: 394 10*3/uL (ref 150.0–400.0)
RBC: 4.17 Mil/uL (ref 3.87–5.11)
RDW: 15.7 % — AB (ref 11.5–15.5)
WBC: 7.1 10*3/uL (ref 4.0–10.5)

## 2015-09-30 LAB — COMPREHENSIVE METABOLIC PANEL
ALT: 9 U/L (ref 0–35)
AST: 13 U/L (ref 0–37)
Albumin: 4.1 g/dL (ref 3.5–5.2)
Alkaline Phosphatase: 97 U/L (ref 39–117)
BILIRUBIN TOTAL: 0.3 mg/dL (ref 0.2–1.2)
BUN: 32 mg/dL — ABNORMAL HIGH (ref 6–23)
CHLORIDE: 102 meq/L (ref 96–112)
CO2: 30 meq/L (ref 19–32)
CREATININE: 1.69 mg/dL — AB (ref 0.40–1.20)
Calcium: 9.8 mg/dL (ref 8.4–10.5)
GFR: 38.87 mL/min — ABNORMAL LOW (ref >60.00)
GLUCOSE: 107 mg/dL — AB (ref 70–99)
POTASSIUM: 4 meq/L (ref 3.5–5.1)
SODIUM: 138 meq/L (ref 135–145)
Total Protein: 8.2 g/dL (ref 6.0–8.3)

## 2015-09-30 LAB — HEMOGLOBIN A1C: Hgb A1c MFr Bld: 6.4 % (ref 4.6–6.5)

## 2015-09-30 LAB — LIPID PANEL
CHOL/HDL RATIO: 4
Cholesterol: 194 mg/dL (ref 0–200)
HDL: 50.7 mg/dL (ref >39.00)
LDL Cholesterol: 126 mg/dL — ABNORMAL HIGH (ref 0–99)
NONHDL: 143.01
TRIGLYCERIDES: 86 mg/dL (ref 0.0–149.0)
VLDL: 17.2 mg/dL (ref 0.0–40.0)

## 2015-09-30 NOTE — Progress Notes (Signed)
Pre visit review using our clinic review tool, if applicable. No additional management support is needed unless otherwise documented below in the visit note. 

## 2015-09-30 NOTE — Assessment & Plan Note (Signed)
Still grieves--but slowly improving

## 2015-09-30 NOTE — Assessment & Plan Note (Signed)
Working on better eating

## 2015-09-30 NOTE — Assessment & Plan Note (Signed)
BP Readings from Last 3 Encounters:  09/30/15 122/84  09/21/15 130/82  04/01/15 132/76   Good control No changes needed

## 2015-09-30 NOTE — Assessment & Plan Note (Signed)
On keppra since the surgery

## 2015-09-30 NOTE — Progress Notes (Signed)
Subjective:    Patient ID: Carrie [name], female    DOB: June 26, 1950, 65 y.o.   MRN: [id number]  HPI Here for follow up of diabetes and other chronic health conditions Here with sister  Neck is some better Muscle relaxer helping  Interested in trying "doTERRA a2z" Reviewed --mostly multivitamin blend Discussed high cost for what it is  Not checking sugars No hypoglycemic spells No sores, pain or numbness in feet  No seizures Continues on keppra since the meningioma surgery  Mood is some better Still grieves husband and daughter--- but will use the lorazepam prn to help mood  No chest pain No SOB No syncope. Did have brief dizziness with the neck pain  Current Outpatient Prescriptions on File Prior to Visit  Medication Sig Dispense Refill  . amLODipine (NORVASC) 10 MG tablet TAKE ONE TABLET BY MOUTH ONE TIME DAILY  90 tablet 3  . atorvastatin (LIPITOR) 40 MG tablet TAKE ONE TABLET BY MOUTH ONE TIME DAILY 90 tablet 0  . furosemide (LASIX) 40 MG tablet Take 40 mg by mouth daily as needed.     [name] ibuprofen (ADVIL,MOTRIN) 600 MG tablet TAKE ONE TABLET BY MOUTH TWICE DAILY AS NEEDED FOR SEVERE NECK PAIN 200 tablet 11  . levETIRAcetam (KEPPRA) 1000 MG tablet TAKE ONE TABLET BY MOUTH TWICE DAILY 180 tablet 3  . LORazepam (ATIVAN) 0.5 MG tablet Take 1-2 tablets (0.5-1 mg total) by mouth 3 (three) times daily as needed (nerves). 60 tablet 0  . losartan-hydrochlorothiazide (HYZAAR) 50-12.5 MG per tablet TAKE ONE TABLET BY MOUTH ONE TIME DAILY 90 tablet 3  . metFORMIN (GLUCOPHAGE) 500 MG tablet TAKE 1 TABLET (500 MG TOTAL) BY MOUTH 2 (TWO) TIMES DAILY WITH A MEAL. 180 tablet 3  . methocarbamol (ROBAXIN) 500 MG tablet Take 1 tablet (500 mg total) by mouth 2 (two) times daily as needed for muscle spasms. 30 tablet 0  . Multiple Vitamin (MULTIVITAMIN) tablet Take 1 tablet by mouth daily.      . traMADol (ULTRAM) 50 MG tablet TAKE 1/2 TO 1 TABLET BY MOUTH 2 TIMES A DAY AS NEEDED 60 tablet  0   No current facility-administered medications on file prior to visit.    Allergies  Allergen Reactions  . Naproxen Sodium     REACTION: unknown  . Sulfamethoxazole-Trimethoprim     REACTION: Unknown rxn    Past Medical History  Diagnosis Date  . Allergy   . Hypertension   . Meningioma ([place])     Foramen magnum  . Diabetes mellitus 2011    Past Surgical History  Procedure Laterality Date  . Cesarean section  1981  . Cervical discectomy  1996    Dr. Alric Seton  . Right wrist x-ray  2007    Deg. at 1st MCP  . 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 Hypertension Other     Strong family history     Social History   Social History  . Marital Status: Married    Spouse Name: N/A  . Number of Children: 1  . Years of Education: N/A   Occupational History  . 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 Disabled since brain surgery    Social History Main Topics  . Smoking status: Never Smoker   . Smokeless tobacco: Never Used  . Alcohol Use: No  . Drug Use: Not on file  . Sexual Activity:  Not on file   Other Topics Concern  . Not on file   Social History Narrative   Lives in family home with extended family.      No living will   Requests niece, [name], as health care POA   Would accept resuscitation attempts   No tube feeds if cognitively aware   Review of Systems Planning cruise in September! Trying to be careful with eating--- nephew cooking healthier. Weight down 9# Sleep is still restless Bowels are fine Objective:   Physical Exam  Constitutional: She appears well-developed. No distress.  Neck: Normal range of motion. Neck supple. No thyromegaly present.  Cardiovascular: Normal rate, regular rhythm, normal heart sounds and intact distal pulses.  Exam reveals no gallop.   No murmur heard. Pulmonary/Chest: Effort normal and breath sounds normal. No respiratory distress. She has  no wheezes. She has no rales.  Musculoskeletal: She exhibits no edema.  Lymphadenopathy:    She has no cervical adenopathy.  Skin:  No foot lesions  Psychiatric: She has a normal mood and affect. Her behavior is normal.          Assessment & Plan:

## 2015-09-30 NOTE — Assessment & Plan Note (Signed)
Doesn't check sugars Will do labs

## 2015-10-03 ENCOUNTER — Other Ambulatory Visit: Payer: Self-pay | Admitting: Family Medicine

## 2015-10-03 NOTE — Telephone Encounter (Signed)
Approved: 30 x 0 

## 2015-10-03 NOTE — Telephone Encounter (Signed)
Will route to PCP 

## 2015-10-03 NOTE — Telephone Encounter (Signed)
Pt requesting a refill, last OV 09/21/15 for neck pain, last filled 09/21/15 #30. Ok to refill?

## 2015-10-09 ENCOUNTER — Other Ambulatory Visit: Payer: Self-pay | Admitting: Internal Medicine

## 2015-10-18 ENCOUNTER — Other Ambulatory Visit (INDEPENDENT_AMBULATORY_CARE_PROVIDER_SITE_OTHER): Payer: Commercial Managed Care - HMO

## 2015-10-18 DIAGNOSIS — N183 Chronic kidney disease, stage 3 unspecified: Secondary | ICD-10-CM

## 2015-10-18 LAB — RENAL FUNCTION PANEL
ALBUMIN: 4.1 g/dL (ref 3.5–5.2)
BUN: 31 mg/dL — ABNORMAL HIGH (ref 6–23)
CALCIUM: 9.8 mg/dL (ref 8.4–10.5)
CO2: 28 mEq/L (ref 19–32)
Chloride: 104 mEq/L (ref 96–112)
Creatinine, Ser: 1.54 mg/dL — ABNORMAL HIGH (ref 0.40–1.20)
GFR: 43.27 mL/min — ABNORMAL LOW (ref >60.00)
GLUCOSE: 110 mg/dL — AB (ref 70–99)
POTASSIUM: 4.1 meq/L (ref 3.5–5.1)
Phosphorus: 4.3 mg/dL (ref 2.3–4.6)
Sodium: 140 mEq/L (ref 135–145)

## 2015-10-19 ENCOUNTER — Other Ambulatory Visit: Payer: Self-pay | Admitting: Internal Medicine

## 2015-10-19 DIAGNOSIS — N183 Chronic kidney disease, stage 3 unspecified: Secondary | ICD-10-CM

## 2015-11-01 ENCOUNTER — Other Ambulatory Visit: Payer: Self-pay | Admitting: Family Medicine

## 2015-11-01 NOTE — Telephone Encounter (Signed)
Received refill electronically Last refill 09/11/15 #60 Last office visit 09/30/15

## 2015-11-01 NOTE — Telephone Encounter (Signed)
Approved: #60 x 0 

## 2015-11-01 NOTE — Telephone Encounter (Signed)
Left refill on voice mail at pharmacy  

## 2015-11-23 ENCOUNTER — Other Ambulatory Visit: Payer: Self-pay | Admitting: Internal Medicine

## 2015-11-23 DIAGNOSIS — H524 Presbyopia: Secondary | ICD-10-CM | POA: Diagnosis not present

## 2015-11-23 DIAGNOSIS — H5210 Myopia, unspecified eye: Secondary | ICD-10-CM | POA: Diagnosis not present

## 2015-11-23 LAB — HM DIABETES EYE EXAM

## 2015-11-28 DIAGNOSIS — E785 Hyperlipidemia, unspecified: Secondary | ICD-10-CM | POA: Diagnosis not present

## 2015-11-28 DIAGNOSIS — I129 Hypertensive chronic kidney disease with stage 1 through stage 4 chronic kidney disease, or unspecified chronic kidney disease: Secondary | ICD-10-CM | POA: Diagnosis not present

## 2015-11-28 DIAGNOSIS — E1122 Type 2 diabetes mellitus with diabetic chronic kidney disease: Secondary | ICD-10-CM | POA: Diagnosis not present

## 2015-11-28 DIAGNOSIS — N183 Chronic kidney disease, stage 3 (moderate): Secondary | ICD-10-CM | POA: Diagnosis not present

## 2015-12-01 ENCOUNTER — Encounter: Payer: Self-pay | Admitting: Internal Medicine

## 2015-12-09 ENCOUNTER — Other Ambulatory Visit: Payer: Self-pay | Admitting: Family Medicine

## 2015-12-09 NOTE — Telephone Encounter (Signed)
Left refills on voice mail at pharmacy  

## 2015-12-09 NOTE — Telephone Encounter (Signed)
Tramadol last filled 11-01-15 #60 Lorazepam last filled 09-21-15 #60 Last OV 09-30-15 Next OV 05-07-16

## 2015-12-09 NOTE — Telephone Encounter (Signed)
Approved: #60 x 0 for both

## 2015-12-13 DIAGNOSIS — N183 Chronic kidney disease, stage 3 (moderate): Secondary | ICD-10-CM | POA: Diagnosis not present

## 2015-12-16 ENCOUNTER — Ambulatory Visit (INDEPENDENT_AMBULATORY_CARE_PROVIDER_SITE_OTHER): Payer: Commercial Managed Care - HMO | Admitting: Internal Medicine

## 2015-12-16 ENCOUNTER — Encounter: Payer: Self-pay | Admitting: Internal Medicine

## 2015-12-16 VITALS — BP 128/88 | HR 94 | Temp 98.0°F | Wt 235.0 lb

## 2015-12-16 DIAGNOSIS — M1711 Unilateral primary osteoarthritis, right knee: Secondary | ICD-10-CM | POA: Diagnosis not present

## 2015-12-16 NOTE — Progress Notes (Signed)
Pre visit review using our clinic review tool, if applicable. No additional management support is needed unless otherwise documented below in the visit note. 

## 2015-12-16 NOTE — Assessment & Plan Note (Signed)
Having severe symptoms  PROCEDURE Sterile prep--medial approach Ethyl chloride and 2cc 2% lidocaine 40mg  depomedrol and 6cc 2% lido instilled Tolerated well and noted immediate relief of pain Discussed home care

## 2015-12-16 NOTE — Progress Notes (Signed)
Subjective:    Patient ID: Carrie [name], female    DOB: Jun 19, 1950, 65 y.o.   MRN: [id number]  HPI Here due to worsening knee pain--wants injection in right knee Very painful in back and in front Has had to start walking with a cane Tried brace--no help  Current Outpatient Prescriptions on File Prior to Visit  Medication Sig Dispense Refill  . amLODipine (NORVASC) 10 MG tablet TAKE ONE TABLET BY MOUTH ONE TIME DAILY  90 tablet 3  . atorvastatin (LIPITOR) 40 MG tablet TAKE ONE TABLET BY MOUTH ONE TIME DAILY 90 tablet 3  . furosemide (LASIX) 40 MG tablet Take 40 mg by mouth daily as needed.     [name] ibuprofen (ADVIL,MOTRIN) 600 MG tablet TAKE ONE TABLET BY MOUTH TWICE DAILY AS NEEDED FOR SEVERE NECK PAIN 200 tablet 11  . levETIRAcetam (KEPPRA) 1000 MG tablet TAKE ONE TABLET BY MOUTH TWICE DAILY 180 tablet 3  . LORazepam (ATIVAN) 0.5 MG tablet TAKE 1-2 TABLET BY MOUTH 3 TIMES A DAY AS NEEDED FOR NERVES 60 tablet 0  . losartan-hydrochlorothiazide (HYZAAR) 50-12.5 MG tablet TAKE ONE TABLET BY MOUTH ONE TIME DAILY 90 tablet 1  . metFORMIN (GLUCOPHAGE) 500 MG tablet TAKE 1 TABLET (500 MG TOTAL) BY MOUTH 2 (TWO) TIMES DAILY WITH A MEAL. 180 tablet 3  . Multiple Vitamin (MULTIVITAMIN) tablet Take 1 tablet by mouth daily.      . traMADol (ULTRAM) 50 MG tablet TAKE 1/2 -1 TABLET BY MOUTH 2 TIMES A DAY AS NEEDED 60 tablet 0   No current facility-administered medications on file prior to visit.    Allergies  Allergen Reactions  . Naproxen Sodium     REACTION: unknown  . Sulfamethoxazole-Trimethoprim     REACTION: Unknown rxn    Past Medical History  Diagnosis Date  . Allergy   . Hypertension   . Meningioma ([place])     Foramen magnum  . Diabetes mellitus 2011    Past Surgical History  Procedure Laterality Date  . Cesarean section  1981  . Cervical discectomy  1996    Dr. Alric Seton  . Right wrist x-ray  2007    Deg. at 1st MCP  . Brain stem tumor  03/08    Foramina magnum  meningioma    Family History  Problem Relation Age of Onset  . Hypertension Other     Strong family history     Social History   Social History  . Marital Status: Married    Spouse Name: N/A  . Number of Children: 1  . Years of Education: N/A   Occupational History  . 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 Disabled since brain surgery    Social History Main Topics  . Smoking status: Never Smoker   . Smokeless tobacco: Never Used  . Alcohol Use: No  . Drug Use: Not on file  . Sexual Activity: Not on file   Other Topics Concern  . Not on file   Social History Narrative   Lives in family home with extended family.      No living will   Requests niece, [name], as health care POA   Would accept resuscitation attempts   No tube feeds if cognitively aware      Review of Systems     Objective:   Physical Exam  Musculoskeletal:  Right knee very thick but without significant effusion          Assessment & Plan:

## 2015-12-19 MED ORDER — METHYLPREDNISOLONE ACETATE 40 MG/ML IJ SUSP
40.0000 mg | Freq: Once | INTRAMUSCULAR | Status: AC
  Administered 2015-12-16: 40 mg via INTRA_ARTICULAR

## 2015-12-19 NOTE — Addendum Note (Signed)
Addended by: Pilar Grammes on: 12/19/2015 09:27 AM   Modules accepted: Orders, SmartSet

## 2015-12-30 ENCOUNTER — Telehealth: Payer: Self-pay

## 2015-12-30 DIAGNOSIS — I129 Hypertensive chronic kidney disease with stage 1 through stage 4 chronic kidney disease, or unspecified chronic kidney disease: Secondary | ICD-10-CM | POA: Diagnosis not present

## 2015-12-30 DIAGNOSIS — N183 Chronic kidney disease, stage 3 (moderate): Secondary | ICD-10-CM | POA: Diagnosis not present

## 2015-12-30 DIAGNOSIS — E1122 Type 2 diabetes mellitus with diabetic chronic kidney disease: Secondary | ICD-10-CM | POA: Diagnosis not present

## 2015-12-30 DIAGNOSIS — E559 Vitamin D deficiency, unspecified: Secondary | ICD-10-CM | POA: Diagnosis not present

## 2015-12-30 DIAGNOSIS — R809 Proteinuria, unspecified: Secondary | ICD-10-CM | POA: Diagnosis not present

## 2015-12-30 NOTE — Telephone Encounter (Signed)
Pt left v/m; pt last seen 12/16/15; pt was told by kidney specialist not to take anymore ibuprofen. Pt was advised to take extra strength Tylenol. FYI to Dr Silvio Pate.

## 2015-12-31 NOTE — Telephone Encounter (Signed)
That is correct. Take it off her list and make sure she knows that I totally agree with this

## 2016-01-02 NOTE — Telephone Encounter (Signed)
D/C ibuprofen from [facility]. Spoke to pt.

## 2016-01-13 ENCOUNTER — Other Ambulatory Visit: Payer: Self-pay | Admitting: Internal Medicine

## 2016-01-13 NOTE — Telephone Encounter (Signed)
Approved: #60 x 0 

## 2016-01-13 NOTE — Telephone Encounter (Signed)
Left refill on voice mail at pharmacy  

## 2016-01-13 NOTE — Telephone Encounter (Signed)
Last filled 12-09-15 #60 Last OV 12-16-15 Next OV 05-07-16

## 2016-01-26 ENCOUNTER — Telehealth: Payer: Self-pay | Admitting: Internal Medicine

## 2016-01-26 DIAGNOSIS — Z1211 Encounter for screening for malignant neoplasm of colon: Secondary | ICD-10-CM

## 2016-01-26 NOTE — Telephone Encounter (Signed)
Referral placed.

## 2016-01-26 NOTE — Telephone Encounter (Signed)
Pt called - she would like referral for coloscopy after sept 16.  She prefers [facility] thanks

## 2016-02-03 ENCOUNTER — Telehealth: Payer: Self-pay

## 2016-02-03 NOTE — Telephone Encounter (Signed)
Rx written and in Kim's box. 

## 2016-02-03 NOTE — Telephone Encounter (Signed)
Pt left v/m; pt was seen 12/16/15; pt is going on a cruise 02/10/16 and pt wants to get a walker with a seat. Pt request cb.

## 2016-02-07 NOTE — Telephone Encounter (Signed)
Spoke to pt and informed her Rx is available for pick up at the front desk 

## 2016-02-16 ENCOUNTER — Encounter: Payer: Self-pay | Admitting: Family Medicine

## 2016-02-16 ENCOUNTER — Other Ambulatory Visit: Payer: Self-pay

## 2016-02-16 ENCOUNTER — Ambulatory Visit (INDEPENDENT_AMBULATORY_CARE_PROVIDER_SITE_OTHER): Payer: Commercial Managed Care - HMO | Admitting: Family Medicine

## 2016-02-16 VITALS — BP 128/80 | HR 103 | Temp 98.3°F | Wt 218.0 lb

## 2016-02-16 DIAGNOSIS — H6091 Unspecified otitis externa, right ear: Secondary | ICD-10-CM

## 2016-02-16 DIAGNOSIS — S73102A Unspecified sprain of left hip, initial encounter: Secondary | ICD-10-CM | POA: Diagnosis not present

## 2016-02-16 DIAGNOSIS — Z23 Encounter for immunization: Secondary | ICD-10-CM

## 2016-02-16 MED ORDER — OFLOXACIN 0.3 % OT SOLN: 10.0000 [drp] | Freq: Every day | OTIC | 0 refills | Status: DC

## 2016-02-16 MED ORDER — LORAZEPAM 0.5 MG PO TABS: ORAL_TABLET | ORAL | 0 refills | Status: DC

## 2016-02-16 MED ORDER — TRAMADOL HCL 50 MG PO TABS: ORAL_TABLET | ORAL | 0 refills | Status: DC

## 2016-02-16 NOTE — Telephone Encounter (Signed)
Approved: okay to refill both for the usual amounts x 0

## 2016-02-16 NOTE — Progress Notes (Signed)
Subjective:    Patient ID: Carrie [name], female    DOB: Dec 13, 1950, 65 y.o.   MRN: [id number]  HPI This is a 65 yo female ,accompanied by her sister with whom she lives, who presents today with right ear pain for 2 weeks, comes and goes until last couple of days when it has been more persistent. Pain in canal. No drainage. Feels sore. Does not use cotton swabs. A little clear watery nasal drainage. No cough, chest pain, or SOB.  She is also having left upper thigh/groin pain, throbbing pain, for about 4-6 weeks. Has been getting worse. Is having to walk with a cane. No falls. No improvement with heat/ice. Does not hurt at night. Pain worse as the day goes on and is also painful after prolonged sitting. Can not recall any trauma.   Past Medical History:  Diagnosis Date  . Allergy   . Diabetes mellitus 2011  . Hypertension   . Meningioma ([place])    Foramen magnum   Past Surgical History:  Procedure Laterality Date  . 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 CERVICAL DISCECTOMY  1996   Dr. Alric Seton  . CESAREAN SECTION  1981  . Right wrist x-ray  2007   Deg. at 1st MCP   Family History  Problem Relation Age of Onset  . Hypertension Other     Strong family history    Social History  Substance Use Topics  . Smoking status: Never Smoker  . Smokeless tobacco: Never Used  . Alcohol use No       Review of Systems Per HPI    Objective:   Physical Exam  Constitutional: She is oriented to person, place, and time. She appears well-developed and well-nourished. No distress.  obese  HENT:  Head: Normocephalic and atraumatic.  Right Ear: There is swelling (canal swollen, slightly red, slightly moist and painful to exam. ).  Left Ear: There is swelling (slight narrowing of canal, no erythema or pain. ).  Eyes: Conjunctivae are normal.  Cardiovascular: Normal rate, regular rhythm and normal heart sounds.   Pulmonary/Chest: Effort normal and breath sounds normal.    Musculoskeletal:       Left upper leg: She exhibits tenderness. She exhibits no bony tenderness, no swelling and no deformity.       Legs: Walking with cane.   Neurological: She is alert and oriented to person, place, and time.  Skin: Skin is warm and dry. She is not diaphoretic.  Psychiatric: She has a normal mood and affect. Her behavior is normal. Judgment and thought content normal.  Vitals reviewed.     BP 128/80   Pulse (!) 103   Temp 98.3 F (36.8 C)   Wt 218 lb (98.9 kg)   SpO2 96%   BMI 38.62 kg/m  Wt Readings from Last 3 Encounters:  02/16/16 218 lb (98.9 kg)  12/16/15 235 lb (106.6 kg)  09/30/15 228 lb (103.4 kg)       Assessment & Plan:  1. Otitis externa, right - Provided written and verbal information regarding diagnosis and treatment. - RTC precautions reviewed - ofloxacin (FLOXIN) 0.3 % otic solution; Place 10 drops into the right ear daily. For 7 days.  Dispense: 5 mL; Refill: 0  2. Sprain of thigh, left, initial encounter - can't take NSAIDs due to kidney disease, can take tylenol - encouraged heat several times a day and stretching to increase ROM - RTC if not improved in 2  weeks, sooner if worsening   Clarene Reamer, FNP-BC  [facility]  02/16/2016 2:32 PM

## 2016-02-16 NOTE — Addendum Note (Signed)
Addended by: Inocencio Homes on: 02/16/2016 02:42 PM   Modules accepted: Orders

## 2016-02-16 NOTE — Telephone Encounter (Signed)
Left refills on voice mail at pharmacy  

## 2016-02-16 NOTE — Progress Notes (Signed)
Pre visit review using our clinic review tool, if applicable. No additional management support is needed unless otherwise documented below in the visit note. 

## 2016-02-16 NOTE — Telephone Encounter (Signed)
Pt is here to see Tor Netters and is requesting refills on the following meds.  Tramadol last filled on 01/13/16 #60 Lorazepam last filled on 12/09/15  She saw you on 12/19/15. Okay to refill?

## 2016-02-16 NOTE — Patient Instructions (Signed)
Heat to sore area and then do stretches at least 3 times a day  Muscle Strain A muscle strain is an injury that occurs when a muscle is stretched beyond its normal length. Usually a small number of muscle fibers are torn when this happens. Muscle strain is rated in degrees. First-degree strains have the least amount of muscle fiber tearing and pain. Second-degree and third-degree strains have increasingly more tearing and pain.  Usually, recovery from muscle strain takes 1-2 weeks. Complete healing takes 5-6 weeks.  CAUSES  Muscle strain happens when a sudden, violent force placed on a muscle stretches it too far. This may occur with lifting, sports, or a fall.  RISK FACTORS Muscle strain is especially common in athletes.  SIGNS AND SYMPTOMS At the site of the muscle strain, there may be:  Pain.  Bruising.  Swelling.  Difficulty using the muscle due to pain or lack of normal function. DIAGNOSIS  Your health care provider will perform a physical exam and ask about your medical history. TREATMENT  Often, the best treatment for a muscle strain is resting, icing, and applying cold compresses to the injured area.  HOME CARE INSTRUCTIONS   Use the PRICE method of treatment to promote muscle healing during the first 2-3 days after your injury. The PRICE method involves:  Protecting the muscle from being injured again.  Restricting your activity and resting the injured body part.  Icing your injury. To do this, put ice in a plastic bag. Place a towel between your skin and the bag. Then, apply the ice and leave it on from 15-20 minutes each hour. After the third day, switch to moist heat packs.  Apply compression to the injured area with a splint or elastic bandage. Be careful not to wrap it too tightly. This may interfere with blood circulation or increase swelling.  Elevate the injured body part above the level of your heart as often as you can.  Only take over-the-counter or  prescription medicines for pain, discomfort, or fever as directed by your health care provider.  Warming up prior to exercise helps to prevent future muscle strains. SEEK MEDICAL CARE IF:   You have increasing pain or swelling in the injured area.  You have numbness, tingling, or a significant loss of strength in the injured area. MAKE SURE YOU:   Understand these instructions.  Will watch your condition.  Will get help right away if you are not doing well or get worse.   This information is not intended to replace advice given to you by your health care provider. Make sure you discuss any questions you have with your health care provider.   Document Released: 05/21/2005 Document Revised: 03/11/2013 Document Reviewed: 12/18/2012 Elsevier Interactive Patient Education 2016 [employer] Otitis Externa Otitis externa is a bacterial or fungal infection of the outer ear canal. This is the area from the eardrum to the outside of the ear. Otitis externa is sometimes called "swimmer's ear." CAUSES  Possible causes of infection include:  Swimming in dirty water.  Moisture remaining in the ear after swimming or bathing.  Mild injury (trauma) to the ear.  Objects stuck in the ear (foreign body).  Cuts or scrapes (abrasions) on the outside of the ear. SIGNS AND SYMPTOMS  The first symptom of infection is often itching in the ear canal. Later signs and symptoms may include swelling and redness of the ear canal, ear pain, and yellowish-white fluid (pus) coming from the ear. The ear pain may  be worse when pulling on the earlobe. DIAGNOSIS  Your health care provider will perform a physical exam. A sample of fluid may be taken from the ear and examined for bacteria or fungi. TREATMENT  Antibiotic ear drops are often given for 10 to 14 days. Treatment may also include pain medicine or corticosteroids to reduce itching and swelling. HOME CARE INSTRUCTIONS   Apply antibiotic ear drops to the  ear canal as prescribed by your health care provider.  Take medicines only as directed by your health care provider.  If you have diabetes, follow any additional treatment instructions from your health care provider.  Keep all follow-up visits as directed by your health care provider. PREVENTION   Keep your ear dry. Use the corner of a towel to absorb water out of the ear canal after swimming or bathing.  Avoid scratching or putting objects inside your ear. This can damage the ear canal or remove the protective wax that lines the canal. This makes it easier for bacteria and fungi to grow.  Avoid swimming in lakes, polluted water, or poorly chlorinated pools.  You may use ear drops made of rubbing alcohol and vinegar after swimming. Combine equal parts of white vinegar and alcohol in a bottle. Put 3 or 4 drops into each ear after swimming. SEEK MEDICAL CARE IF:   You have a fever.  Your ear is still red, swollen, painful, or draining pus after 3 days.  Your redness, swelling, or pain gets worse.  You have a severe headache.  You have redness, swelling, pain, or tenderness in the area behind your ear. MAKE SURE YOU:   Understand these instructions.  Will watch your condition.  Will get help right away if you are not doing well or get worse.   This information is not intended to replace advice given to you by your health care provider. Make sure you discuss any questions you have with your health care provider.   Document Released: 05/21/2005 Document Revised: 06/11/2014 Document Reviewed: 06/07/2011 Elsevier Interactive Patient Education [employer].

## 2016-02-27 ENCOUNTER — Other Ambulatory Visit: Payer: Self-pay | Admitting: Internal Medicine

## 2016-03-05 ENCOUNTER — Ambulatory Visit (INDEPENDENT_AMBULATORY_CARE_PROVIDER_SITE_OTHER): Payer: Commercial Managed Care - HMO | Admitting: Internal Medicine

## 2016-03-05 ENCOUNTER — Other Ambulatory Visit: Payer: Self-pay | Admitting: Internal Medicine

## 2016-03-05 ENCOUNTER — Ambulatory Visit (INDEPENDENT_AMBULATORY_CARE_PROVIDER_SITE_OTHER)
Admission: RE | Admit: 2016-03-05 | Discharge: 2016-03-05 | Disposition: A | Discharge status: Home / Self Care | Payer: Commercial Managed Care - HMO | Source: Ambulatory Visit | Attending: Internal Medicine | Admitting: Internal Medicine

## 2016-03-05 ENCOUNTER — Encounter: Payer: Self-pay | Admitting: Internal Medicine

## 2016-03-05 DIAGNOSIS — M25552 Pain in left hip: Secondary | ICD-10-CM

## 2016-03-05 DIAGNOSIS — M1612 Unilateral primary osteoarthritis, left hip: Secondary | ICD-10-CM | POA: Diagnosis not present

## 2016-03-05 NOTE — Progress Notes (Signed)
Pre visit review using our clinic review tool, if applicable. No additional management support is needed unless otherwise documented below in the visit note. 

## 2016-03-05 NOTE — Progress Notes (Signed)
Subjective:    Patient ID: Carrie [name], female    DOB: 1951/01/14, 65 y.o.   MRN: [id number]  HPI Here due to left groin pain Hurting quite a bit--needs cane again Throbbing pain---started ~1 month ago and really worse in past 2-3 weeks No known injury No radiation of pain  Tried heating pad and tylenol Better if sitting and keeping it up Has to be very slow getting up---worse with walking Not bad in bed  Tried tramadol--not really any help  Got really good response to the knee injection  Current Outpatient Prescriptions on File Prior to Visit  Medication Sig Dispense Refill  . amLODipine (NORVASC) 10 MG tablet TAKE 1 TABLET EVERY DAY 90 tablet 0  . atorvastatin (LIPITOR) 40 MG tablet TAKE ONE TABLET BY MOUTH ONE TIME DAILY 90 tablet 3  . furosemide (LASIX) 40 MG tablet Take 40 mg by mouth daily as needed.     . levETIRAcetam (KEPPRA) 1000 MG tablet TAKE ONE TABLET BY MOUTH TWICE DAILY 180 tablet 3  . LORazepam (ATIVAN) 0.5 MG tablet TAKE 1-2 TABLET BY MOUTH 3 TIMES A DAY AS NEEDED FOR NERVES 60 tablet 0  . losartan-hydrochlorothiazide (HYZAAR) 50-12.5 MG tablet TAKE ONE TABLET BY MOUTH ONE TIME DAILY 90 tablet 1  . metFORMIN (GLUCOPHAGE) 500 MG tablet TAKE 1 TABLET (500 MG TOTAL) BY MOUTH 2 (TWO) TIMES DAILY WITH A MEAL. 180 tablet 3  . Multiple Vitamin (MULTIVITAMIN) tablet Take 1 tablet by mouth daily.      . traMADol (ULTRAM) 50 MG tablet TAKE 1/2 TO 1 TABLET TWICE A DAY AS NEEDE 60 tablet 0  . Vitamin D, Ergocalciferol, (DRISDOL) 50000 units CAPS capsule TAKE 1 CAPSULE BY MOUTH ONCE A MONTH  0   No current facility-administered medications on file prior to visit.     Allergies  Allergen Reactions  . Naproxen Sodium     REACTION: unknown  . Sulfamethoxazole-Trimethoprim     REACTION: Unknown rxn    Past Medical History:  Diagnosis Date  . Allergy   . Diabetes mellitus 2011  . Hypertension   . 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 CERVICAL DISCECTOMY  1996   Dr. Alric Seton  . CESAREAN SECTION  1981  . Right wrist x-ray  2007   Deg. at 1st MCP    Family History  Problem Relation Age of Onset  . Hypertension Other     Strong family history     Social History   Social History  . Marital status: Married    Spouse name: N/A  . Number of children: 1  . Years of education: N/A   Occupational History  . 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 Disabled since brain surgery    Social History Main Topics  . Smoking status: Never Smoker  . Smokeless tobacco: Never Used  . Alcohol use No  . Drug use: Unknown  . Sexual activity: Not on file   Other Topics Concern  . Not on file   Social History Narrative   Lives in family home with extended family.      No living will   Requests niece, [name], as health care POA   Would accept resuscitation attempts   No tube feeds if cognitively aware   Review of Systems No urinary symptoms Bowels are good Appetite  is okay    Objective:   Physical Exam  Musculoskeletal:  ROM of right hip is fairly normal Left hip --she guards any movement. Pain marked with internal rotation (but also with external rotation and flexion)  Neurological:  Antalgic gait Uses cane          Assessment & Plan:

## 2016-03-05 NOTE — Assessment & Plan Note (Signed)
Striking findings with any movement of hip Will check x-ray Will refer to ortho [name]) if significant disease

## 2016-03-14 ENCOUNTER — Telehealth: Payer: Self-pay | Admitting: Internal Medicine

## 2016-03-14 ENCOUNTER — Ambulatory Visit (INDEPENDENT_AMBULATORY_CARE_PROVIDER_SITE_OTHER): Payer: Commercial Managed Care - HMO | Admitting: Orthopaedic Surgery

## 2016-03-14 DIAGNOSIS — M1612 Unilateral primary osteoarthritis, left hip: Secondary | ICD-10-CM | POA: Diagnosis not present

## 2016-03-14 DIAGNOSIS — M25552 Pain in left hip: Secondary | ICD-10-CM | POA: Diagnosis not present

## 2016-03-14 NOTE — Telephone Encounter (Signed)
Okay She can handle that with Jefm Bryant herself--make sure she gives them plenty of notice

## 2016-03-14 NOTE — Telephone Encounter (Signed)
Patient called to let us know that she is having Hip Replacement on 03/27/16. She will have to reschedule her Colonoscopy with Jefm Bryant.

## 2016-03-21 ENCOUNTER — Other Ambulatory Visit: Payer: Self-pay | Admitting: Physician Assistant

## 2016-03-23 ENCOUNTER — Encounter (HOSPITAL_COMMUNITY): Payer: Self-pay

## 2016-03-23 NOTE — Pre-Procedure Instructions (Signed)
Carrie [name] States  03/23/2016      [facility] - [place], [address] [place] [address] Phone: [phone] Fax: [phone]  [id number] - [place], [address] [place] [address] Phone: [phone] Fax: [phone]    Your procedure is scheduled on Tuesday October 24.  Report to [facility] Admitting at 10:15 A.M.  Call this number if you have problems the morning of surgery:  [phone]   Remember:  Do not eat food or drink liquids after midnight.  Take these medicines the morning of surgery with A SIP OF WATER: acetaminophen (tylenol) if needed, amlodipine (norvasc), levetiracetam (Keppra), lorazepam (ativan) if needed, tramadol (ultram) if needed  DO NOT take any Aspirin, Aleve, Naproxen, Ibuprofen, Motrin, Advil, Goody's, BC's, all herbal medications, fish oil, and all vitamins   WHAT DO I DO ABOUT MY DIABETES MEDICATION?   [name] Do not take oral diabetes medicines (pills) the morning of surgery. DO NOT TAKE metformin (Glucophage) the day of surgery.     How to Manage Your Diabetes Before and After Surgery  Why is it important to control my blood sugar before and after surgery? . Improving blood sugar levels before and after surgery helps healing and can limit problems. . A way of improving blood sugar control is eating a healthy diet by: o  Eating less sugar and carbohydrates o  Increasing activity/exercise o  Talking with your doctor about reaching your blood sugar goals . High blood sugars (greater than 180 mg/dL) can raise your risk of infections and slow your recovery, so you will need to focus on controlling your diabetes during the weeks before surgery. . Make sure that the doctor who takes care of your diabetes knows about your planned surgery including the date and location.  How do I manage my blood sugar before surgery? . Check your blood sugar at least 4 times a  day, starting 2 days before surgery, to make sure that the level is not too high or low. o Check your blood sugar the morning of your surgery when you wake up and every 2 hours until you get to the Short Stay unit. . If your blood sugar is less than 70 mg/dL, you will need to treat for low blood sugar: o Do not take insulin. o Treat a low blood sugar (less than 70 mg/dL) with  cup of clear juice (cranberry or apple), 4 glucose tablets, OR glucose gel. o Recheck blood sugar in 15 minutes after treatment (to make sure it is greater than 70 mg/dL). If your blood sugar is not greater than 70 mg/dL on recheck, call [phone] for further instructions. . Report your blood sugar to the short stay nurse when you get to Short Stay.  . If you are admitted to the hospital after surgery: o Your blood sugar will be checked by the staff and you will probably be given insulin after surgery (instead of oral diabetes medicines) to make sure you have good blood sugar levels. o The goal for blood sugar control after surgery is 80-180 mg/dL.                Do not wear jewelry, make-up or nail polish.  Do not wear lotions, powders, or perfumes, or deoderant.  Do not shave 48 hours prior to surgery.  Men may shave face and neck.  Do not bring valuables to the hospital.  [name] is not responsible for any belongings or valuables.  Contacts, dentures or bridgework may not be worn into surgery.  Leave your suitcase in the car.  After surgery it may be brought to your room.  For patients admitted to the hospital, discharge time will be determined by your treatment team.  Patients discharged the day of surgery will not be allowed to drive home.    Special instructions:    [facility]- Preparing For Surgery  Before surgery, you can play an important role. Because skin is not sterile, your skin needs to be as free of germs as possible. You can reduce the number of germs on your skin by washing  with CHG (chlorahexidine gluconate) Soap before surgery.  CHG is an antiseptic cleaner which kills germs and bonds with the skin to continue killing germs even after washing.  Please do not use if you have an allergy to CHG or antibacterial soaps. If your skin becomes reddened/irritated stop using the CHG.  Do not shave (including legs and underarms) for at least 48 hours prior to first CHG shower. It is OK to shave your face.  Please follow these instructions carefully.   1. Shower the NIGHT BEFORE SURGERY and the MORNING OF SURGERY with CHG.   2. If you chose to wash your hair, wash your hair first as usual with your normal shampoo.  3. After you shampoo, rinse your hair and body thoroughly to remove the shampoo.  4. Use CHG as you would any other liquid soap. You can apply CHG directly to the skin and wash gently with a scrungie or a clean washcloth.   5. Apply the CHG Soap to your body ONLY FROM THE NECK DOWN.  Do not use on open wounds or open sores. Avoid contact with your eyes, ears, mouth and genitals (private parts). Wash genitals (private parts) with your normal soap.  6. Wash thoroughly, paying special attention to the area where your surgery will be performed.  7. Thoroughly rinse your body with warm water from the neck down.  8. DO NOT shower/wash with your normal soap after using and rinsing off the CHG Soap.  9. Pat yourself dry with a CLEAN TOWEL.   10. Wear CLEAN PAJAMAS   11. Place CLEAN SHEETS on your bed the night of your first shower and DO NOT SLEEP WITH PETS.    Day of Surgery: Do not apply any deodorants/lotions. Please wear clean clothes to the hospital/surgery center.      Please read over the following fact sheets that you were given. MRSA Information

## 2016-03-26 ENCOUNTER — Encounter (HOSPITAL_COMMUNITY)
Admission: RE | Admit: 2016-03-26 | Discharge: 2016-03-26 | Disposition: A | Discharge status: Home / Self Care | Payer: Commercial Managed Care - HMO | Source: Ambulatory Visit | Attending: Orthopaedic Surgery | Admitting: Orthopaedic Surgery

## 2016-03-26 ENCOUNTER — Encounter (HOSPITAL_COMMUNITY): Payer: Self-pay

## 2016-03-26 DIAGNOSIS — I129 Hypertensive chronic kidney disease with stage 1 through stage 4 chronic kidney disease, or unspecified chronic kidney disease: Secondary | ICD-10-CM | POA: Diagnosis present

## 2016-03-26 DIAGNOSIS — M169 Osteoarthritis of hip, unspecified: Secondary | ICD-10-CM | POA: Diagnosis not present

## 2016-03-26 DIAGNOSIS — Z96642 Presence of left artificial hip joint: Secondary | ICD-10-CM | POA: Diagnosis not present

## 2016-03-26 DIAGNOSIS — Z01812 Encounter for preprocedural laboratory examination: Secondary | ICD-10-CM

## 2016-03-26 DIAGNOSIS — F39 Unspecified mood [affective] disorder: Secondary | ICD-10-CM | POA: Diagnosis present

## 2016-03-26 DIAGNOSIS — Z8249 Family history of ischemic heart disease and other diseases of the circulatory system: Secondary | ICD-10-CM | POA: Diagnosis not present

## 2016-03-26 DIAGNOSIS — N189 Chronic kidney disease, unspecified: Secondary | ICD-10-CM | POA: Diagnosis not present

## 2016-03-26 DIAGNOSIS — Z79899 Other long term (current) drug therapy: Secondary | ICD-10-CM | POA: Diagnosis not present

## 2016-03-26 DIAGNOSIS — E7800 Pure hypercholesterolemia, unspecified: Secondary | ICD-10-CM | POA: Diagnosis present

## 2016-03-26 DIAGNOSIS — M1711 Unilateral primary osteoarthritis, right knee: Secondary | ICD-10-CM | POA: Diagnosis present

## 2016-03-26 DIAGNOSIS — E119 Type 2 diabetes mellitus without complications: Secondary | ICD-10-CM | POA: Diagnosis not present

## 2016-03-26 DIAGNOSIS — M25552 Pain in left hip: Secondary | ICD-10-CM | POA: Diagnosis present

## 2016-03-26 DIAGNOSIS — M1612 Unilateral primary osteoarthritis, left hip: Secondary | ICD-10-CM | POA: Diagnosis present

## 2016-03-26 DIAGNOSIS — Z01810 Encounter for preprocedural cardiovascular examination: Secondary | ICD-10-CM

## 2016-03-26 DIAGNOSIS — Z471 Aftercare following joint replacement surgery: Secondary | ICD-10-CM | POA: Diagnosis not present

## 2016-03-26 DIAGNOSIS — E1122 Type 2 diabetes mellitus with diabetic chronic kidney disease: Secondary | ICD-10-CM | POA: Diagnosis present

## 2016-03-26 DIAGNOSIS — E118 Type 2 diabetes mellitus with unspecified complications: Secondary | ICD-10-CM

## 2016-03-26 DIAGNOSIS — N183 Chronic kidney disease, stage 3 (moderate): Secondary | ICD-10-CM | POA: Diagnosis present

## 2016-03-26 DIAGNOSIS — Z6841 Body Mass Index (BMI) 40.0 and over, adult: Secondary | ICD-10-CM | POA: Diagnosis not present

## 2016-03-26 DIAGNOSIS — Z7984 Long term (current) use of oral hypoglycemic drugs: Secondary | ICD-10-CM | POA: Diagnosis not present

## 2016-03-26 HISTORY — DX: Unspecified osteoarthritis, unspecified site: M19.90

## 2016-03-26 LAB — BASIC METABOLIC PANEL
Anion gap: 9 (ref 5–15)
BUN: 21 mg/dL — ABNORMAL HIGH (ref 6–20)
CHLORIDE: 106 mmol/L (ref 101–111)
CO2: 25 mmol/L (ref 22–32)
Calcium: 9.3 mg/dL (ref 8.9–10.3)
Creatinine, Ser: 1.77 mg/dL — ABNORMAL HIGH (ref 0.44–1.00)
GFR calc non Af Amer: 29 mL/min — ABNORMAL LOW (ref >60)
GFR, EST AFRICAN AMERICAN: 33 mL/min — AB (ref >60)
Glucose, Bld: 95 mg/dL (ref 65–99)
Potassium: 4.1 mmol/L (ref 3.5–5.1)
Sodium: 140 mmol/L (ref 135–145)

## 2016-03-26 LAB — SURGICAL PCR SCREEN
MRSA, PCR: NEGATIVE
Staphylococcus aureus: NEGATIVE

## 2016-03-26 LAB — CBC
HCT: 32.9 % — ABNORMAL LOW (ref 36.0–46.0)
HEMOGLOBIN: 10.4 g/dL — AB (ref 12.0–15.0)
MCH: 26.6 pg (ref 26.0–34.0)
MCHC: 31.6 g/dL (ref 30.0–36.0)
MCV: 84.1 fL (ref 78.0–100.0)
Platelets: 355 10*3/uL (ref 150–400)
RBC: 3.91 MIL/uL (ref 3.87–5.11)
RDW: 15.6 % — ABNORMAL HIGH (ref 11.5–15.5)
WBC: 7.7 10*3/uL (ref 4.0–10.5)

## 2016-03-26 LAB — GLUCOSE, CAPILLARY: GLUCOSE-CAPILLARY: 93 mg/dL (ref 65–99)

## 2016-03-26 MED ORDER — TRANEXAMIC ACID 1000 MG/10ML IV SOLN
1000.0000 mg | INTRAVENOUS | Status: AC
  Administered 2016-03-27: 1000 mg via INTRAVENOUS
  Filled 2016-03-26: qty 10

## 2016-03-26 NOTE — Progress Notes (Signed)
Anesthesia chart review: Patient is a 65 year old female scheduled for left total hip, anterior approach on 03/27/2016 by Dr. Jean Rosenthal. PAT was just this morning.  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 BMI is consistent with morbid obesity.  PCP is Dr. Silvio Pate. Nephrologist is Dr. Lavonia Dana with [facility] (records scanned under the Media tab), last visit 11/28/15. BUN 28, Cr 1.58 at that time, eGRF 39 mL/min/1.73.  BP 131/77   Pulse 90   Temp 36.8 C   Resp 20   Ht 5\' 3"  (1.6 m)   Wt 230 lb 11.2 oz (104.6 kg)   SpO2 98%   BMI 40.87 kg/m   Meds include amlodipine, Lipitor, Lasix, Keppra, Ativan, losartan-HCTZ, metformin, tramadol.   03/26/16 EKG: NSR.  06/27/07 Echo:SUMMARY - Limited study due to poor acoustic windows. - Overall left ventricular systolic function was normal. Left    ventricular ejection fraction was estimated , range being 60    % to 65 %. There was no diagnostic evidence of left    ventricular regional wall motion abnormalities. Features were    consistent with mild diastolic dysfunction. - Left atrial size was at the upper limits of normal. - There was a small pericardial effusion.  Preoperative labs noted. H/H 10.4/32.9, PLT 355. Glucose 95. BUN 21, Cr 1.77 which appears overall stable when compared to April and May 2017 labs. She has known CKD and is actively seeing a nephrologist. Renal function can be followed closely postoperatively.  A1c is pending (was 6.4% on 09/30/15).   George Hugh [facility] Short Stay Center/Anesthesiology Phone [phone] 03/26/2016 1:11 PM

## 2016-03-26 NOTE — Progress Notes (Addendum)
PCP: Dr. Silvio Pate Pt states she sees a kidney specialist in [place] to monitor kidney function but does not know the name. Pt does not see cardiologist for any reason.   Echo: 06/27/2007 Stress test: Done prior to surgery in 2008 pt does not know who ordered test but states everything was ok. Stress test in epic 06/27/2007  Pt states she is Diabetic and takes metformin but does not check blood sugar at home. Pt states she will call Dr. Silvio Pate regarding this to see if she needs to be checking her blood sugars and to get prescription for machine. Hgb A1c drawn.   No complaints of chest pain, SOB, signs of infection per pt.

## 2016-03-27 ENCOUNTER — Inpatient Hospital Stay (HOSPITAL_COMMUNITY)
Admission: RE | Admit: 2016-03-27 | Discharge: 2016-03-30 | DRG: 470 | Disposition: A | Discharge status: Home Health | Payer: Commercial Managed Care - HMO | Source: Ambulatory Visit | Attending: Orthopaedic Surgery | Admitting: Orthopaedic Surgery

## 2016-03-27 ENCOUNTER — Inpatient Hospital Stay (HOSPITAL_COMMUNITY): Payer: Commercial Managed Care - HMO | Admitting: Certified Registered Nurse Anesthetist

## 2016-03-27 ENCOUNTER — Inpatient Hospital Stay (HOSPITAL_COMMUNITY): Payer: Commercial Managed Care - HMO | Admitting: Vascular Surgery

## 2016-03-27 ENCOUNTER — Inpatient Hospital Stay (HOSPITAL_COMMUNITY): Payer: Commercial Managed Care - HMO

## 2016-03-27 ENCOUNTER — Encounter (HOSPITAL_COMMUNITY)
Admission: RE | Disposition: A | Discharge status: Home Health | Payer: Self-pay | Source: Ambulatory Visit | Attending: Orthopaedic Surgery

## 2016-03-27 DIAGNOSIS — R262 Difficulty in walking, not elsewhere classified: Secondary | ICD-10-CM

## 2016-03-27 DIAGNOSIS — I129 Hypertensive chronic kidney disease with stage 1 through stage 4 chronic kidney disease, or unspecified chronic kidney disease: Secondary | ICD-10-CM | POA: Diagnosis present

## 2016-03-27 DIAGNOSIS — Z7984 Long term (current) use of oral hypoglycemic drugs: Secondary | ICD-10-CM | POA: Diagnosis not present

## 2016-03-27 DIAGNOSIS — M25552 Pain in left hip: Secondary | ICD-10-CM

## 2016-03-27 DIAGNOSIS — M1612 Unilateral primary osteoarthritis, left hip: Principal | ICD-10-CM

## 2016-03-27 DIAGNOSIS — E1122 Type 2 diabetes mellitus with diabetic chronic kidney disease: Secondary | ICD-10-CM | POA: Diagnosis present

## 2016-03-27 DIAGNOSIS — N183 Chronic kidney disease, stage 3 (moderate): Secondary | ICD-10-CM | POA: Diagnosis present

## 2016-03-27 DIAGNOSIS — M1711 Unilateral primary osteoarthritis, right knee: Secondary | ICD-10-CM | POA: Diagnosis present

## 2016-03-27 DIAGNOSIS — Z6841 Body Mass Index (BMI) 40.0 and over, adult: Secondary | ICD-10-CM | POA: Diagnosis not present

## 2016-03-27 DIAGNOSIS — F39 Unspecified mood [affective] disorder: Secondary | ICD-10-CM | POA: Diagnosis present

## 2016-03-27 DIAGNOSIS — E7800 Pure hypercholesterolemia, unspecified: Secondary | ICD-10-CM | POA: Diagnosis present

## 2016-03-27 DIAGNOSIS — Z96642 Presence of left artificial hip joint: Secondary | ICD-10-CM

## 2016-03-27 DIAGNOSIS — Z419 Encounter for procedure for purposes other than remedying health state, unspecified: Secondary | ICD-10-CM

## 2016-03-27 DIAGNOSIS — Z471 Aftercare following joint replacement surgery: Secondary | ICD-10-CM | POA: Diagnosis not present

## 2016-03-27 DIAGNOSIS — Z8249 Family history of ischemic heart disease and other diseases of the circulatory system: Secondary | ICD-10-CM

## 2016-03-27 DIAGNOSIS — Z79899 Other long term (current) drug therapy: Secondary | ICD-10-CM | POA: Diagnosis not present

## 2016-03-27 HISTORY — DX: Chronic kidney disease, stage 3 unspecified: N18.30

## 2016-03-27 HISTORY — PX: TOTAL HIP ARTHROPLASTY: SHX124

## 2016-03-27 HISTORY — DX: Depression, unspecified: F32.A

## 2016-03-27 HISTORY — DX: Chronic kidney disease, stage 3 (moderate): N18.3

## 2016-03-27 HISTORY — DX: Personal history of Methicillin resistant Staphylococcus aureus infection: Z86.14

## 2016-03-27 HISTORY — DX: Personal history of other medical treatment: Z92.89

## 2016-03-27 HISTORY — DX: Major depressive disorder, single episode, unspecified: F32.9

## 2016-03-27 HISTORY — DX: Type 2 diabetes mellitus without complications: E11.9

## 2016-03-27 LAB — HEMOGLOBIN A1C
Hgb A1c MFr Bld: 5.8 % — ABNORMAL HIGH (ref 4.8–5.6)
Mean Plasma Glucose: 120 mg/dL

## 2016-03-27 LAB — GLUCOSE, CAPILLARY
GLUCOSE-CAPILLARY: 164 mg/dL — AB (ref 65–99)
GLUCOSE-CAPILLARY: 125 mg/dL — AB (ref 65–99)
Glucose-Capillary: 103 mg/dL — ABNORMAL HIGH (ref 65–99)
Glucose-Capillary: 113 mg/dL — ABNORMAL HIGH (ref 65–99)

## 2016-03-27 SURGERY — ARTHROPLASTY, HIP, TOTAL, ANTERIOR APPROACH
Anesthesia: General | Laterality: Left

## 2016-03-27 MED ORDER — ATORVASTATIN CALCIUM 40 MG PO TABS
40.0000 mg | ORAL_TABLET | Freq: Every day | ORAL | Status: DC
  Administered 2016-03-27 – 2016-03-28 (×2): 40 mg via ORAL
  Filled 2016-03-27 (×2): qty 1

## 2016-03-27 MED ORDER — METHOCARBAMOL 1000 MG/10ML IJ SOLN: 500.0000 mg | Freq: Four times a day (QID) | INTRAMUSCULAR | Status: DC | PRN

## 2016-03-27 MED ORDER — HYDROMORPHONE HCL 1 MG/ML IJ SOLN
INTRAMUSCULAR | Status: AC
  Filled 2016-03-27: qty 0.5

## 2016-03-27 MED ORDER — LACTATED RINGERS IV SOLN
INTRAVENOUS | Status: DC | PRN
  Administered 2016-03-27 (×2): via INTRAVENOUS

## 2016-03-27 MED ORDER — CEFAZOLIN IN D5W 1 GM/50ML IV SOLN
1.0000 g | Freq: Four times a day (QID) | INTRAVENOUS | Status: AC
  Administered 2016-03-27 – 2016-03-28 (×2): 1 g via INTRAVENOUS
  Filled 2016-03-27 (×2): qty 50

## 2016-03-27 MED ORDER — HYDROCHLOROTHIAZIDE 12.5 MG PO CAPS
12.5000 mg | ORAL_CAPSULE | Freq: Every day | ORAL | Status: DC
  Administered 2016-03-28 – 2016-03-30 (×3): 12.5 mg via ORAL
  Filled 2016-03-27 (×3): qty 1

## 2016-03-27 MED ORDER — HYDROMORPHONE HCL 2 MG/ML IJ SOLN
1.0000 mg | INTRAMUSCULAR | Status: DC | PRN
  Administered 2016-03-28 (×2): 1 mg via INTRAVENOUS
  Filled 2016-03-27 (×2): qty 1

## 2016-03-27 MED ORDER — CEFAZOLIN SODIUM-DEXTROSE 2-4 GM/100ML-% IV SOLN
2.0000 g | INTRAVENOUS | Status: AC
  Administered 2016-03-27: 2 g via INTRAVENOUS

## 2016-03-27 MED ORDER — DIPHENHYDRAMINE HCL 12.5 MG/5ML PO ELIX: 12.5000 mg | ORAL_SOLUTION | ORAL | Status: DC | PRN

## 2016-03-27 MED ORDER — HYDROMORPHONE HCL 1 MG/ML IJ SOLN
0.2500 mg | INTRAMUSCULAR | Status: DC | PRN
  Administered 2016-03-27 (×4): 0.5 mg via INTRAVENOUS

## 2016-03-27 MED ORDER — METOCLOPRAMIDE HCL 5 MG PO TABS: 5.0000 mg | ORAL_TABLET | Freq: Three times a day (TID) | ORAL | Status: DC | PRN

## 2016-03-27 MED ORDER — FENTANYL CITRATE (PF) 100 MCG/2ML IJ SOLN
INTRAMUSCULAR | Status: AC
  Filled 2016-03-27: qty 4

## 2016-03-27 MED ORDER — LIDOCAINE HCL (CARDIAC) 20 MG/ML IV SOLN
INTRAVENOUS | Status: DC | PRN
  Administered 2016-03-27: 60 mg via INTRAVENOUS

## 2016-03-27 MED ORDER — SUGAMMADEX SODIUM 200 MG/2ML IV SOLN
INTRAVENOUS | Status: DC | PRN
  Administered 2016-03-27: 200 mg via INTRAVENOUS

## 2016-03-27 MED ORDER — PROMETHAZINE HCL 25 MG/ML IJ SOLN: 6.2500 mg | INTRAMUSCULAR | Status: DC | PRN

## 2016-03-27 MED ORDER — ACETAMINOPHEN 650 MG RE SUPP: 650.0000 mg | Freq: Four times a day (QID) | RECTAL | Status: DC | PRN

## 2016-03-27 MED ORDER — ACETAMINOPHEN 325 MG PO TABS
650.0000 mg | ORAL_TABLET | Freq: Four times a day (QID) | ORAL | Status: DC | PRN
  Administered 2016-03-27 – 2016-03-28 (×3): 650 mg via ORAL
  Filled 2016-03-27 (×3): qty 2

## 2016-03-27 MED ORDER — LOSARTAN POTASSIUM-HCTZ 50-12.5 MG PO TABS: 1.0000 | ORAL_TABLET | Freq: Every day | ORAL | Status: DC

## 2016-03-27 MED ORDER — INSULIN ASPART 100 UNIT/ML ~~LOC~~ SOLN
0.0000 [IU] | Freq: Three times a day (TID) | SUBCUTANEOUS | Status: DC
  Administered 2016-03-27 – 2016-03-28 (×2): 3 [IU] via SUBCUTANEOUS

## 2016-03-27 MED ORDER — LOSARTAN POTASSIUM 50 MG PO TABS
50.0000 mg | ORAL_TABLET | Freq: Every day | ORAL | Status: DC
  Administered 2016-03-28 – 2016-03-30 (×3): 50 mg via ORAL
  Filled 2016-03-27 (×3): qty 1

## 2016-03-27 MED ORDER — FENTANYL CITRATE (PF) 100 MCG/2ML IJ SOLN
INTRAMUSCULAR | Status: DC | PRN
  Administered 2016-03-27 (×2): 50 ug via INTRAVENOUS
  Administered 2016-03-27: 100 ug via INTRAVENOUS

## 2016-03-27 MED ORDER — METOCLOPRAMIDE HCL 5 MG/ML IJ SOLN: 5.0000 mg | Freq: Three times a day (TID) | INTRAMUSCULAR | Status: DC | PRN

## 2016-03-27 MED ORDER — METFORMIN HCL 500 MG PO TABS
500.0000 mg | ORAL_TABLET | Freq: Two times a day (BID) | ORAL | Status: DC
  Administered 2016-03-27 – 2016-03-30 (×5): 500 mg via ORAL
  Filled 2016-03-27 (×5): qty 1

## 2016-03-27 MED ORDER — MIDAZOLAM HCL 2 MG/2ML IJ SOLN
INTRAMUSCULAR | Status: AC
  Filled 2016-03-27: qty 2

## 2016-03-27 MED ORDER — 0.9 % SODIUM CHLORIDE (POUR BTL) OPTIME
TOPICAL | Status: DC | PRN
  Administered 2016-03-27: 1000 mL

## 2016-03-27 MED ORDER — PHENOL 1.4 % MT LIQD: 1.0000 | OROMUCOSAL | Status: DC | PRN

## 2016-03-27 MED ORDER — ONDANSETRON HCL 4 MG/2ML IJ SOLN
4.0000 mg | Freq: Four times a day (QID) | INTRAMUSCULAR | Status: DC | PRN
  Administered 2016-03-28: 4 mg via INTRAVENOUS
  Filled 2016-03-27: qty 2

## 2016-03-27 MED ORDER — METHOCARBAMOL 500 MG PO TABS
ORAL_TABLET | ORAL | Status: AC
  Filled 2016-03-27: qty 1

## 2016-03-27 MED ORDER — MENTHOL 3 MG MT LOZG: 1.0000 | LOZENGE | OROMUCOSAL | Status: DC | PRN

## 2016-03-27 MED ORDER — DOCUSATE SODIUM 100 MG PO CAPS
100.0000 mg | ORAL_CAPSULE | Freq: Two times a day (BID) | ORAL | Status: DC
  Administered 2016-03-27 – 2016-03-30 (×6): 100 mg via ORAL
  Filled 2016-03-27 (×6): qty 1

## 2016-03-27 MED ORDER — POLYETHYLENE GLYCOL 3350 17 G PO PACK: 17.0000 g | PACK | Freq: Every day | ORAL | Status: DC | PRN

## 2016-03-27 MED ORDER — CHLORHEXIDINE GLUCONATE 4 % EX LIQD: 60.0000 mL | Freq: Once | CUTANEOUS | Status: DC

## 2016-03-27 MED ORDER — ALUM & MAG HYDROXIDE-SIMETH 200-200-20 MG/5ML PO SUSP
30.0000 mL | ORAL | Status: DC | PRN
Start: 2016-03-27 — End: 2016-03-30

## 2016-03-27 MED ORDER — RIVAROXABAN 10 MG PO TABS
10.0000 mg | ORAL_TABLET | Freq: Every day | ORAL | Status: DC
  Administered 2016-03-28 – 2016-03-30 (×3): 10 mg via ORAL
  Filled 2016-03-27 (×3): qty 1

## 2016-03-27 MED ORDER — LORAZEPAM 0.5 MG PO TABS: 0.5000 mg | ORAL_TABLET | Freq: Three times a day (TID) | ORAL | Status: DC | PRN

## 2016-03-27 MED ORDER — OXYCODONE HCL 5 MG PO TABS
5.0000 mg | ORAL_TABLET | ORAL | Status: DC | PRN
  Administered 2016-03-27 – 2016-03-30 (×14): 10 mg via ORAL
  Filled 2016-03-27 (×14): qty 2

## 2016-03-27 MED ORDER — ZOLPIDEM TARTRATE 5 MG PO TABS: 5.0000 mg | ORAL_TABLET | Freq: Every evening | ORAL | Status: DC | PRN

## 2016-03-27 MED ORDER — METHOCARBAMOL 500 MG PO TABS
500.0000 mg | ORAL_TABLET | Freq: Four times a day (QID) | ORAL | Status: DC | PRN
  Administered 2016-03-27 – 2016-03-29 (×5): 500 mg via ORAL
  Filled 2016-03-27 (×4): qty 1

## 2016-03-27 MED ORDER — INSULIN ASPART 100 UNIT/ML ~~LOC~~ SOLN: 0.0000 [IU] | Freq: Every day | SUBCUTANEOUS | Status: DC

## 2016-03-27 MED ORDER — LEVETIRACETAM 500 MG PO TABS
1000.0000 mg | ORAL_TABLET | Freq: Two times a day (BID) | ORAL | Status: DC
  Administered 2016-03-27 – 2016-03-30 (×6): 1000 mg via ORAL
  Filled 2016-03-27 (×6): qty 2

## 2016-03-27 MED ORDER — FUROSEMIDE 40 MG PO TABS
40.0000 mg | ORAL_TABLET | Freq: Every day | ORAL | Status: DC
  Administered 2016-03-27 – 2016-03-30 (×4): 40 mg via ORAL
  Filled 2016-03-27 (×4): qty 1

## 2016-03-27 MED ORDER — PROPOFOL 10 MG/ML IV BOLUS
INTRAVENOUS | Status: DC | PRN
  Administered 2016-03-27: 150 mg via INTRAVENOUS

## 2016-03-27 MED ORDER — ROCURONIUM BROMIDE 100 MG/10ML IV SOLN
INTRAVENOUS | Status: DC | PRN
  Administered 2016-03-27: 10 mg via INTRAVENOUS
  Administered 2016-03-27: 50 mg via INTRAVENOUS

## 2016-03-27 MED ORDER — SODIUM CHLORIDE 0.9 % IR SOLN
Status: DC | PRN
  Administered 2016-03-27: 3000 mL

## 2016-03-27 MED ORDER — MIDAZOLAM HCL 5 MG/5ML IJ SOLN
INTRAMUSCULAR | Status: DC | PRN
  Administered 2016-03-27: 2 mg via INTRAVENOUS

## 2016-03-27 MED ORDER — SODIUM CHLORIDE 0.9 % IV SOLN
INTRAVENOUS | Status: DC
  Administered 2016-03-27: 11:00:00 via INTRAVENOUS

## 2016-03-27 MED ORDER — AMLODIPINE BESYLATE 10 MG PO TABS
10.0000 mg | ORAL_TABLET | Freq: Every day | ORAL | Status: DC
  Administered 2016-03-28 – 2016-03-29 (×2): 10 mg via ORAL
  Filled 2016-03-27 (×3): qty 1

## 2016-03-27 MED ORDER — SODIUM CHLORIDE 0.9 % IV SOLN
INTRAVENOUS | Status: DC
  Administered 2016-03-27: 23:00:00 via INTRAVENOUS

## 2016-03-27 MED ORDER — ONDANSETRON HCL 4 MG PO TABS: 4.0000 mg | ORAL_TABLET | Freq: Four times a day (QID) | ORAL | Status: DC | PRN

## 2016-03-27 MED ORDER — CEFAZOLIN SODIUM-DEXTROSE 2-4 GM/100ML-% IV SOLN
INTRAVENOUS | Status: AC
  Filled 2016-03-27: qty 100

## 2016-03-27 MED ORDER — PHENYLEPHRINE HCL 10 MG/ML IJ SOLN
INTRAVENOUS | Status: DC | PRN
  Administered 2016-03-27: 40 ug/min via INTRAVENOUS

## 2016-03-27 SURGICAL SUPPLY — 57 items
APL SKNCLS STERI-STRIP NONHPOA (GAUZE/BANDAGES/DRESSINGS) ×1
BENZOIN TINCTURE PRP APPL 2/3 (GAUZE/BANDAGES/DRESSINGS) ×3 IMPLANT
BLADE SAW SGTL 18X1.27X75 (BLADE) ×2 IMPLANT
BLADE SAW SGTL 18X1.27X75MM (BLADE) ×1
BLADE SURG ROTATE 9660 (MISCELLANEOUS) IMPLANT
CAPT HIP TOTAL 2 ×2 IMPLANT
CELLS DAT CNTRL 66122 CELL SVR (MISCELLANEOUS) ×1 IMPLANT
CLOSURE WOUND 1/2 X4 (GAUZE/BANDAGES/DRESSINGS) ×2
COVER SURGICAL LIGHT HANDLE (MISCELLANEOUS) ×3 IMPLANT
DRAPE C-ARM 42X72 X-RAY (DRAPES) ×3 IMPLANT
DRAPE STERI IOBAN 125X83 (DRAPES) ×3 IMPLANT
DRAPE U-SHAPE 47X51 STRL (DRAPES) ×7 IMPLANT
DRESSING AQUACEL AG SP 3.5X10 (GAUZE/BANDAGES/DRESSINGS) IMPLANT
DRSG AQUACEL AG ADV 3.5X10 (GAUZE/BANDAGES/DRESSINGS) ×3 IMPLANT
DRSG AQUACEL AG SP 3.5X10 (GAUZE/BANDAGES/DRESSINGS) ×3
DURAPREP 26ML APPLICATOR (WOUND CARE) ×3 IMPLANT
ELECT BLADE 4.0 EZ CLEAN MEGAD (MISCELLANEOUS) ×3
ELECT BLADE 6.5 EXT (BLADE) IMPLANT
ELECT REM PT RETURN 9FT ADLT (ELECTROSURGICAL) ×3
ELECTRODE BLDE 4.0 EZ CLN MEGD (MISCELLANEOUS) ×1 IMPLANT
ELECTRODE REM PT RTRN 9FT ADLT (ELECTROSURGICAL) ×1 IMPLANT
FACESHIELD WRAPAROUND (MASK) ×6 IMPLANT
FACESHIELD WRAPAROUND OR TEAM (MASK) ×2 IMPLANT
GAUZE XEROFORM 1X8 LF (GAUZE/BANDAGES/DRESSINGS) ×2 IMPLANT
GLOVE BIOGEL PI IND STRL 8 (GLOVE) ×2 IMPLANT
GLOVE BIOGEL PI INDICATOR 8 (GLOVE) ×4
GLOVE ECLIPSE 8.0 STRL XLNG CF (GLOVE) ×3 IMPLANT
GLOVE ORTHO TXT STRL SZ7.5 (GLOVE) ×6 IMPLANT
GOWN STRL REUS W/ TWL LRG LVL3 (GOWN DISPOSABLE) ×2 IMPLANT
GOWN STRL REUS W/ TWL XL LVL3 (GOWN DISPOSABLE) ×2 IMPLANT
GOWN STRL REUS W/TWL LRG LVL3 (GOWN DISPOSABLE) ×6
GOWN STRL REUS W/TWL XL LVL3 (GOWN DISPOSABLE) ×6
HANDPIECE INTERPULSE COAX TIP (DISPOSABLE) ×3
KIT BASIN OR (CUSTOM PROCEDURE TRAY) ×3 IMPLANT
KIT ROOM TURNOVER OR (KITS) ×3 IMPLANT
MANIFOLD NEPTUNE II (INSTRUMENTS) ×3 IMPLANT
NS IRRIG 1000ML POUR BTL (IV SOLUTION) ×3 IMPLANT
PACK TOTAL JOINT (CUSTOM PROCEDURE TRAY) ×3 IMPLANT
PAD ARMBOARD 7.5X6 YLW CONV (MISCELLANEOUS) ×3 IMPLANT
RETRACTOR WND ALEXIS 18 MED (MISCELLANEOUS) ×1 IMPLANT
RTRCTR WOUND ALEXIS 18CM MED (MISCELLANEOUS) ×3
SET HNDPC FAN SPRY TIP SCT (DISPOSABLE) ×1 IMPLANT
STAPLER VISISTAT 35W (STAPLE) ×2 IMPLANT
STRIP CLOSURE SKIN 1/2X4 (GAUZE/BANDAGES/DRESSINGS) ×4 IMPLANT
SUT ETHIBOND NAB CT1 #1 30IN (SUTURE) ×3 IMPLANT
SUT MNCRL AB 4-0 PS2 18 (SUTURE) IMPLANT
SUT VIC AB 0 CT1 27 (SUTURE) ×3
SUT VIC AB 0 CT1 27XBRD ANBCTR (SUTURE) ×1 IMPLANT
SUT VIC AB 1 CT1 27 (SUTURE) ×3
SUT VIC AB 1 CT1 27XBRD ANBCTR (SUTURE) ×1 IMPLANT
SUT VIC AB 2-0 CT1 27 (SUTURE) ×3
SUT VIC AB 2-0 CT1 TAPERPNT 27 (SUTURE) ×1 IMPLANT
TOWEL OR 17X24 6PK STRL BLUE (TOWEL DISPOSABLE) ×3 IMPLANT
TOWEL OR 17X26 10 PK STRL BLUE (TOWEL DISPOSABLE) ×3 IMPLANT
TRAY FOLEY CATH 16FRSI W/METER (SET/KITS/TRAYS/PACK) ×2 IMPLANT
WATER STERILE IRR 1000ML POUR (IV SOLUTION) ×2 IMPLANT
YANKAUER SUCT BULB TIP NO VENT (SUCTIONS) ×2 IMPLANT

## 2016-03-27 NOTE — Anesthesia Preprocedure Evaluation (Addendum)
Anesthesia Evaluation  Patient identified by MRN, date of birth, ID band Patient awake    Reviewed: Allergy & Precautions, NPO status , Patient's Chart, lab work & pertinent test results  Airway Mallampati: II  TM Distance: >3 FB Neck ROM: Full    Dental  (+) Dental Advisory Given, Poor Dentition, Missing   Pulmonary neg pulmonary ROS,    breath sounds clear to auscultation       Cardiovascular hypertension, Pt. on medications  Rhythm:Regular Rate:Normal     Neuro/Psych  Headaches, Anxiety    GI/Hepatic negative GI ROS, Neg liver ROS,   Endo/Other  diabetes, Well Controlled, Type 2Morbid obesity  Renal/GU CRFRenal disease     Musculoskeletal  (+) Arthritis ,   Abdominal   Peds  Hematology negative hematology ROS (+)   Anesthesia Other Findings   Reproductive/Obstetrics                           Lab Results  Component Value Date   WBC 7.7 03/26/2016   HGB 10.4 (L) 03/26/2016   HCT 32.9 (L) 03/26/2016   MCV 84.1 03/26/2016   PLT 355 03/26/2016   Lab Results  Component Value Date   CREATININE 1.77 (H) 03/26/2016   BUN 21 (H) 03/26/2016   NA 140 03/26/2016   K 4.1 03/26/2016   CL 106 03/26/2016   CO2 25 03/26/2016     Anesthesia Physical Anesthesia Plan  ASA: III  Anesthesia Plan: General   Post-op Pain Management:    Induction: Intravenous  Airway Management Planned: Oral ETT  Additional Equipment:   Intra-op Plan:   Post-operative Plan: Extubation in OR  Informed Consent: I have reviewed the patients History and Physical, chart, labs and discussed the procedure including the risks, benefits and alternatives for the proposed anesthesia with the patient or authorized representative who has indicated his/her understanding and acceptance.   Dental advisory given  Plan Discussed with: CRNA, Anesthesiologist and Surgeon  Anesthesia Plan Comments: (Pt declines spinal  anesthesia after risks, benefits and alternatives explained.)      Anesthesia Quick Evaluation

## 2016-03-27 NOTE — H&P (Signed)
TOTAL HIP ADMISSION H&P  Patient is admitted for left total hip arthroplasty.  Subjective:  Chief Complaint: left hip pain  HPI: Carrie [name], 65 y.o. female, has a history of pain and functional disability in the left hip(s) due to arthritis and patient has failed non-surgical conservative treatments for greater than 12 weeks to include flexibility and strengthening excercises, use of assistive devices, weight reduction as appropriate and activity modification.  Onset of symptoms was gradual starting 5 years ago with gradually worsening course since that time.The patient noted no past surgery on the left hip(s).  Patient currently rates pain in the left hip at 10 out of 10 with activity. Patient has night pain, worsening of pain with activity and weight bearing, trendelenberg gait, pain that interfers with activities of daily living and pain with passive range of motion. Patient has evidence of subchondral cysts, subchondral sclerosis, periarticular osteophytes and joint space narrowing by imaging studies. This condition presents safety issues increasing the risk of falls.  There is no current active infection.  Patient Active Problem List   Diagnosis Date Noted  . Unilateral primary osteoarthritis, left hip 03/27/2016  . Left hip pain 03/05/2016  . Mood disorder ([place]) 04/01/2015  . Dental infection 02/03/2014  . Osteoarthritis of right knee 02/03/2014  . Neck pain 02/03/2014  . Advanced directives, counseling/discussion 02/03/2014  . Routine general medical examination at a health care facility 07/08/2012  . BMI 40.0-44.9, adult ([place]) 07/08/2012  . Diabetes mellitus type 2, controlled, without complications ([place])   . COMMON MIGRAINE 09/27/2006  . HYPERCHOLESTEROLEMIA 09/26/2006  . Essential hypertension, benign 09/26/2006  . ALLERGIC RHINITIS 09/26/2006  . MENINGIOMA 08/03/2006   Past Medical History:  Diagnosis Date  . Allergy   . Arthritis   . CKD (chronic kidney disease)     CKD stage III 12/2015 (Dr. Lavonia Dana)  . Diabetes mellitus 2011   type II  . Hypertension   . Meningioma ([place])    Foramen magnum    Past Surgical History:  Procedure Laterality Date  . 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 CERVICAL DISCECTOMY  1996   Dr. Alric Seton  . CESAREAN SECTION  1981  . COLONOSCOPY    . DILATION AND CURETTAGE OF UTERUS    . Right wrist x-ray  2007   Deg. at 1st MCP    Prescriptions Prior to Admission  Medication Sig Dispense Refill Last Dose  . acetaminophen (TYLENOL) 500 MG tablet Take 1,000 mg by mouth every 6 (six) hours as needed for mild pain.   03/26/2016 at Unknown time  . amLODipine (NORVASC) 10 MG tablet TAKE 1 TABLET EVERY DAY 90 tablet 0 03/26/2016 at Unknown time  . atorvastatin (LIPITOR) 40 MG tablet TAKE ONE TABLET BY MOUTH ONE TIME DAILY 90 tablet 3 03/26/2016 at Unknown time  . furosemide (LASIX) 40 MG tablet Take 40 mg by mouth daily.    03/26/2016 at Unknown time  . levETIRAcetam (KEPPRA) 1000 MG tablet TAKE ONE TABLET BY MOUTH TWICE DAILY 180 tablet 3 03/27/2016 at Unknown time  . LORazepam (ATIVAN) 0.5 MG tablet TAKE 1-2 TABLET BY MOUTH 3 TIMES A DAY AS NEEDED FOR NERVES (Patient taking differently: Take 0.5 mg by mouth every 8 (eight) hours as needed for anxiety. ) 60 tablet 0 03/26/2016 at Unknown time  . losartan-hydrochlorothiazide (HYZAAR) 50-12.5 MG tablet TAKE ONE TABLET BY MOUTH ONE TIME DAILY 90 tablet 1 03/26/2016 at Unknown time  . metFORMIN (GLUCOPHAGE) 500 MG  tablet TAKE 1 TABLET (500 MG TOTAL) BY MOUTH 2 (TWO) TIMES DAILY WITH A MEAL. 180 tablet 3 03/26/2016 at Unknown time  . traMADol (ULTRAM) 50 MG tablet TAKE 1/2 TO 1 TABLET TWICE A DAY AS NEEDE (Patient taking differently: Take 50 mg by mouth daily as needed for moderate pain. ) 60 tablet 0 03/26/2016 at Unknown time  . Vitamin D, Ergocalciferol, (DRISDOL) 50000 units CAPS capsule TAKE 1 CAPSULE BY MOUTH ONCE A MONTH  0 03/26/2016 at Unknown time    Allergies  Allergen Reactions  . Naproxen Sodium Anaphylaxis and Swelling  . Sulfamethoxazole-Trimethoprim Anaphylaxis and Swelling    Social History  Substance Use Topics  . Smoking status: Never Smoker  . Smokeless tobacco: Never Used  . Alcohol use No    Family History  Problem Relation Age of Onset  . Hypertension Other     Strong family history      Review of Systems  Musculoskeletal: Positive for joint pain.  All other systems reviewed and are negative.   Objective:  Physical Exam  Constitutional: She is oriented to person, place, and time. She appears well-developed and well-nourished.  HENT:  Head: Normocephalic and atraumatic.  Eyes: EOM are normal. Pupils are equal, round, and reactive to light.  Neck: Normal range of motion. Neck supple.  Cardiovascular: Normal rate and regular rhythm.   Respiratory: Effort normal and breath sounds normal.  GI: Soft. Bowel sounds are normal.  Musculoskeletal:       Left hip: She exhibits decreased range of motion, decreased strength, tenderness and bony tenderness.  Neurological: She is alert and oriented to person, place, and time.  Skin: Skin is warm and dry.  Psychiatric: She has a normal mood and affect.    Vital signs in last 24 hours: Temp:  [97.4 F (36.3 C)] 97.4 F (36.3 C) (10/24 1021) Pulse Rate:  [91] 91 (10/24 1021) Resp:  [20] 20 (10/24 1021) BP: (154)/(89) 154/89 (10/24 1021) SpO2:  [100 %] 100 % (10/24 1021) Weight:  [230 lb (104.3 kg)] 230 lb (104.3 kg) (10/24 1021)  Labs:   Estimated body mass index is 40.74 kg/m as calculated from the following:   Height as of 03/26/16: 5\' 3"  (1.6 m).   Weight as of this encounter: 230 lb (104.3 kg).   Imaging Review Plain radiographs demonstrate severe degenerative joint disease of the left hip(s). The bone quality appears to be good for age and reported activity level.  Assessment/Plan:  End stage arthritis, left hip(s)  The patient history,  physical examination, clinical judgement of the provider and imaging studies are consistent with end stage degenerative joint disease of the left hip(s) and total hip arthroplasty is deemed medically necessary. The treatment options including medical management, injection therapy, arthroscopy and arthroplasty were discussed at length. The risks and benefits of total hip arthroplasty were presented and reviewed. The risks due to aseptic loosening, infection, stiffness, dislocation/subluxation,  thromboembolic complications and other imponderables were discussed.  The patient acknowledged the explanation, agreed to proceed with the plan and consent was signed. Patient is being admitted for inpatient treatment for surgery, pain control, PT, OT, prophylactic antibiotics, VTE prophylaxis, progressive ambulation and ADL's and discharge planning.The patient is planning to be discharged home with home health services

## 2016-03-27 NOTE — Anesthesia Procedure Notes (Signed)
Procedure Name: Intubation Date/Time: 03/27/2016 12:58 PM Performed by: [facility] Pre-anesthesia Checklist: Patient identified, Emergency Drugs available, Suction available, Patient being monitored and Timeout performed Patient Re-evaluated:Patient Re-evaluated prior to inductionOxygen Delivery Method: Circle system utilized Preoxygenation: Pre-oxygenation with 100% oxygen Intubation Type: IV induction Ventilation: Mask ventilation without difficulty and Oral airway inserted - appropriate to patient size Laryngoscope Size: Mac and 4 Grade View: Grade II Tube type: Oral Tube size: 7.5 mm Number of attempts: 1 Airway Equipment and Method: Stylet Placement Confirmation: ETT inserted through vocal cords under direct vision,  positive ETCO2 and breath sounds checked- equal and bilateral Secured at: 23 cm Tube secured with: Tape Dental Injury: Teeth and Oropharynx as per pre-operative assessment

## 2016-03-27 NOTE — Evaluation (Signed)
Physical Therapy Evaluation Patient Details Name: Carrie [name] MRN: [id number] DOB: 07-12-1950 Today's Date: 03/27/2016   History of Present Illness  65 y.o. female admitted to [facility] on 03/27/16 for elective L THA.  Pt with significant PMHx of forament magnum meningioma (s/p brain surgery), HTN, DM, and CKD.  Clinical Impression  Pt is POD #0 and is moving well, min assist overall with RW in room distances.  She will likely progress well enough to d/c home with family's assist and HHPT f/u at discharge.   PT to follow acutely for deficits listed below.       Follow Up Recommendations Home health PT;Supervision for mobility/OOB    Equipment Recommendations  3in1 (PT) (wide?)    Recommendations for Other Services   NA    Precautions / Restrictions Precautions Precautions: Fall Restrictions Weight Bearing Restrictions: Yes LLE Weight Bearing: Weight bearing as tolerated      Mobility  Bed Mobility Overal bed mobility: Needs Assistance Bed Mobility: Supine to Sit     Supine to sit: HOB elevated;Min assist     General bed mobility comments: min assist to help progress left leg over EOB.  Per pt report, she sleeps in a recliner chair and plans to do so at home as well  Transfers Overall transfer level: Needs assistance Equipment used: Rolling walker (2 wheeled) Transfers: Sit to/from Stand Sit to Stand: Min assist         General transfer comment: Min assist to stabilize RW and help steady trunk during transitions.  Verbal cues for safe hand placement.   Ambulation/Gait Ambulation/Gait assistance: Min guard;+2 safety/equipment Ambulation Distance (Feet): 15 Feet Assistive device: Rolling walker (2 wheeled) Gait Pattern/deviations: Step-to pattern;Antalgic     General Gait Details: Pt with moderately antalgic gait pattern, verbal cues for correct LE sequencing, assist needed for balance at trunk, chair to follow for safety since it was her first time up.           Balance Overall balance assessment: Needs assistance Sitting-balance support: Feet supported;Bilateral upper extremity supported Sitting balance-Leahy Scale: Good     Standing balance support: Bilateral upper extremity supported Standing balance-Leahy Scale: Poor                               Pertinent Vitals/Pain Pain Assessment: 0-10 Pain Score: 9  Pain Location: left hip Pain Descriptors / Indicators: Aching;Burning Pain Intervention(s): Limited activity within patient's tolerance;Monitored during session;Repositioned;RN gave pain meds during session    Home Living Family/patient expects to be discharged to:: Private residence Living Arrangements: Other relatives (multiple family memebers (sisters, nieces, nephews)) Available Help at Discharge: Family;Available 24 hours/day Type of Home: House Home Access: Stairs to enter Entrance Stairs-Rails: None Entrance Stairs-Number of Steps: 2 Home Layout: Two level;Able to live on main level with bedroom/bathroom Home Equipment: Walker - 4 wheels;Shower seat               Extremity/Trunk Assessment   Upper Extremity Assessment: Defer to OT evaluation           Lower Extremity Assessment: LLE deficits/detail   LLE Deficits / Details: left leg with normal post op pain and weakness, ankle at least 3/5, knee 3-/5, hip 2/5  Cervical / Trunk Assessment: Normal  Communication   Communication: No difficulties  Cognition Arousal/Alertness: Awake/alert Behavior During Therapy: WFL for tasks assessed/performed Overall Cognitive Status: Within Functional Limits for tasks assessed  Exercises Total Joint Exercises Ankle Circles/Pumps: AROM;Both;20 reps   Assessment/Plan    PT Assessment Patient needs continued PT services  PT Problem List Decreased strength;Decreased range of motion;Decreased activity tolerance;Decreased balance;Decreased mobility;Decreased knowledge of use  of DME;Obesity;Pain          PT Treatment Interventions DME instruction;Stair training;Gait training;Functional mobility training;Therapeutic activities;Therapeutic exercise;Balance training;Patient/family education;Modalities    PT Goals (Current goals can be found in the Care Plan section)  Acute Rehab PT Goals Patient Stated Goal: to go home PT Goal Formulation: With patient Time For Goal Achievement: 04/03/16 Potential to Achieve Goals: Good    Frequency 7X/week           End of Session Equipment Utilized During Treatment: Gait belt Activity Tolerance: Patient limited by pain Patient left: in chair;with call bell/phone within reach;with family/visitor present           Time: 1741-1806 PT Time Calculation (min) (ACUTE ONLY): 25 min   Charges:   PT Evaluation $PT Eval Moderate Complexity: 1 Procedure PT Treatments $Gait Training: 8-22 mins        Kort Stettler B. Lawonda Pretlow, PT, DPT [phone]   03/27/2016, 6:24 PM

## 2016-03-27 NOTE — Anesthesia Postprocedure Evaluation (Signed)
Anesthesia Post Note  Patient: Carrie [name]  Procedure(s) Performed: Procedure(s) (LRB): LEFT TOTAL HIP ARTHROPLASTY ANTERIOR APPROACH (Left)  Patient location during evaluation: PACU Anesthesia Type: General Level of consciousness: sedated Pain management: pain level controlled Vital Signs Assessment: post-procedure vital signs reviewed and stable Respiratory status: spontaneous breathing and respiratory function stable Cardiovascular status: stable Anesthetic complications: no    Last Vitals:  Vitals:   03/27/16 1545 03/27/16 1553  BP: 128/62 130/62  Pulse: 83 84  Resp: 19 12  Temp:  36.4 C    Last Pain:  Vitals:   03/27/16 1553  TempSrc:   PainSc: 3                  Lexx [place] DANIEL

## 2016-03-27 NOTE — Brief Op Note (Signed)
03/27/2016  2:22 PM  PATIENT:  Golden Hurter Mcguinn  65 y.o. female  PRE-OPERATIVE DIAGNOSIS:  osteoarthritis left hip  POST-OPERATIVE DIAGNOSIS:  osteoarthritis left hip  PROCEDURE:  Procedure(s): LEFT TOTAL HIP ARTHROPLASTY ANTERIOR APPROACH (Left)  SURGEON:  Surgeon(s) and Role:    * Mcarthur Rossetti, MD - Primary  PHYSICIAN ASSISTANT: Benita Stabile, PA-C  ANESTHESIA:   general  EBL:  Total I/O In: 1000 [I.V.:1000] Out: 350 [Urine:150; Blood:200]  COUNTS:  YES  DICTATION: .Other Dictation: Dictation Number [id number]  PLAN OF CARE: Admit to inpatient   PATIENT DISPOSITION:  PACU - hemodynamically stable.   Delay start of Pharmacological VTE agent (>24hrs) due to surgical blood loss or risk of bleeding: no

## 2016-03-27 NOTE — Transfer of Care (Signed)
Immediate Anesthesia Transfer of Care Note  Patient: Carrie [name]  Procedure(s) Performed: Procedure(s): LEFT TOTAL HIP ARTHROPLASTY ANTERIOR APPROACH (Left)  Patient Location: PACU  Anesthesia Type:General  Level of Consciousness: awake, alert , oriented and patient cooperative  Airway & Oxygen Therapy: Patient Spontanous Breathing and Patient connected to nasal cannula oxygen  Post-op Assessment: Report given to RN, Post -op Vital signs reviewed and stable and Patient moving all extremities X 4  Post vital signs: Reviewed and stable  Last Vitals:  Vitals:   03/27/16 1021 03/27/16 1444  BP: (!) 154/89 119/73  Pulse: 91 78  Resp: 20 16  Temp: 36.3 C 36.6 C    Last Pain:  Vitals:   03/27/16 1444  TempSrc:   PainSc: (P) 0-No pain      Patients Stated Pain Goal: 3 (61/22/44 9753)  Complications: No apparent anesthesia complications

## 2016-03-28 ENCOUNTER — Encounter (HOSPITAL_COMMUNITY): Payer: Self-pay | Admitting: Orthopaedic Surgery

## 2016-03-28 LAB — BASIC METABOLIC PANEL
Anion gap: 12 (ref 5–15)
BUN: 24 mg/dL — ABNORMAL HIGH (ref 6–20)
CALCIUM: 8.6 mg/dL — AB (ref 8.9–10.3)
CO2: 21 mmol/L — AB (ref 22–32)
CREATININE: 1.83 mg/dL — AB (ref 0.44–1.00)
Chloride: 105 mmol/L (ref 101–111)
GFR calc Af Amer: 32 mL/min — ABNORMAL LOW (ref >60)
GFR calc non Af Amer: 27 mL/min — ABNORMAL LOW (ref >60)
GLUCOSE: 162 mg/dL — AB (ref 65–99)
Potassium: 4.2 mmol/L (ref 3.5–5.1)
Sodium: 138 mmol/L (ref 135–145)

## 2016-03-28 LAB — GLUCOSE, CAPILLARY
GLUCOSE-CAPILLARY: 119 mg/dL — AB (ref 65–99)
Glucose-Capillary: 100 mg/dL — ABNORMAL HIGH (ref 65–99)
Glucose-Capillary: 190 mg/dL — ABNORMAL HIGH (ref 65–99)
Glucose-Capillary: 115 mg/dL — ABNORMAL HIGH (ref 65–99)

## 2016-03-28 LAB — CBC
HEMATOCRIT: 30.6 % — AB (ref 36.0–46.0)
Hemoglobin: 9.8 g/dL — ABNORMAL LOW (ref 12.0–15.0)
MCH: 26.8 pg (ref 26.0–34.0)
MCHC: 32 g/dL (ref 30.0–36.0)
MCV: 83.8 fL (ref 78.0–100.0)
Platelets: 253 10*3/uL (ref 150–400)
RBC: 3.65 MIL/uL — ABNORMAL LOW (ref 3.87–5.11)
RDW: 15.8 % — AB (ref 11.5–15.5)
WBC: 11.5 10*3/uL — ABNORMAL HIGH (ref 4.0–10.5)

## 2016-03-28 NOTE — Care Management Note (Signed)
Case Management Note  Patient Details  Name: RHETA HEMMELGARN MRN: [id number] Date of Birth: 06-21-1950  Subjective/Objective:    Left THA                Action/Plan: Discharge Planning: NCM spoke to pt and preoperatively arranged with [facility] for [facility]. Pt agreeable to [facility] for [facility]. Has RW for home Requesting 3n1 bedside commode. NCM will order on 03/29/2016.    Expected Discharge Date:                Expected Discharge Plan:  [place]  In-House Referral:  NA  Discharge planning Services  CM Consult  Post Acute Care Choice:  Home Health Choice offered to:  Patient  DME Arranged:  3-N-1 DME Agency:  [place]:    [place]:     Status of Service:     If discussed at [place] of Stay Meetings, dates discussed:    Additional Comments:  Erenest Rasher, RN 03/28/2016, 4:37 PM

## 2016-03-28 NOTE — Op Note (Signed)
NAMEMarland Kitchen  [name], Carrie NO.:  [id number]  MEDICAL RECORD NO.:  [id number]  LOCATION:  [id number]                        FACILITY:  [facility]  PHYSICIAN:  Lind Guest. [name], M.D.DATE OF BIRTH:  May 20, 1951  DATE OF PROCEDURE:  03/27/2016 DATE OF DISCHARGE:                              OPERATIVE REPORT   PREOPERATIVE DIAGNOSES:  Primary osteoarthritis and degenerative joint disease, left hip.  POSTOPERATIVE DIAGNOSES:  Primary osteoarthritis and degenerative joint disease, left hip.  PROCEDURE:  Left total hip arthroplasty through direct anterior approach.  IMPLANTS:  DePuy Sector Gription acetabular component size 50, size 32 +0 neutral polyethylene liner, size 13 Corail femoral component with standard offset, size 32 +1 ceramic hip ball.  SURGEON:  Lind Guest. [name], M.D.  ASSISTANT:  Erskine Emery, PA-C.  ANESTHESIA:  General.  BLOOD LOSS:  200 mL.  ANTIBIOTICS:  2 g of IV Ancef.  COMPLICATIONS:  None.  INDICATIONS:  [name] is a 65 year old pleasant individual with multiple medical problems with also debilitating arthritis involving her left hip.  She is a diabetic but has her hemoglobin A1c below 6.  Her x- ray showed complete loss of her left joint space, periarticular osteophytes, and sclerotic changes.  Her pain is daily and it detrimentally affects her activities of daily living, her mobility, and her quality of life.  She is at a point she does wish to proceed with a total hip arthroplasty through direct anterior approach.  I explained to her in detail the risks and benefits of the surgery and that all these are heightened risks given her obesity and her diabetes as well as renal disease.  Once a thorough discussion was had and informed consent was obtained.  PROCEDURE DESCRIPTION:  After informed consent was obtained, appropriate left hip was marked.  She was brought to the operating room, and general anesthesia was obtained and  placed while she was on her stretcher.  A Foley catheter was placed.  Then, traction boots were placed on both her feet.  Next, she was placed supine on the Hana fracture table, the perineal post in place, and both legs in inline skeletal traction devices, but no traction applied.  Her left operative hip was then prepped and draped with DuraPrep and sterile drapes.  Time-out was called and she was identified as correct patient, correct left hip.  We then made incision just inferior and posterior to the anterior-superior iliac spine and carried this obliquely down the leg.  We dissected down tensor fascia lata muscle.  The tensor fascia was then divided longitudinally to proceed with a direct anterior approach to the hip. We identified and cauterized the circumflex vessels and then identified the hip capsule.  I opened up the hip capsule in L-type format finding a moderate joint effusion and significant periarticular osteophytes.  We placed Cobra retractors around the medial femoral neck within the joint capsule and then made our femoral neck cut with an oscillating saw proximal to the lesser trochanter and completed this with an osteotome. We placed a corkscrew guide in the femoral head and removed the femoral head in its entirety and found it to have significant areas devoid of cartilage.  We then cleaned  the acetabular remnants, the acetabular labrum.  I placed a bent Hohmann over the medial acetabular rim and then began reaming under direct visualization from a size 43 reamer and stepwise increments up to a size 50 with all reamers under direct visualization, the last reamer under direct fluoroscopy, so I could really obtain my depth of reaming, our inclination, and anteversion. Once I was pleased with this, I placed the real DePuy Sector Gription acetabular component size 50 and a 32 +0 neutral polyethylene liner for that size acetabular component.  Attention was then turned to the  femur. With the leg externally rotated to 120 degrees, extended and adducted, we were able to place a Mueller retractor medially and a Hohmann retractor behind the greater trochanter.  We released the lateral joint capsule, and then used a box cutting osteotome to enter the femoral canal and a rongeur to lateralize.  We then began broaching from a size 8 broach using the Corail broaching system going up to a size 13.  With a size 13 trial in place, we trialed a standard offset femoral neck and a 32 +1 hip ball, we rolled leg back over and up and reduced it, traction and internal rotation, reducing the pelvis.  We were pleased with leg length, offset, and stability on exam and fluoroscopy.  We then dislocated the hip and removed the trial components.  We were able to easily placed the real Corail femoral component size 13 with a real 32 +1 ceramic hip ball and reduced this in the acetabulum, again it was stable.  We were able to copiously irrigate the soft tissue using normal saline solution and pulsatile lavage.  We then closed the joint capsule with interrupted #1 Ethibond suture followed by running #1 Vicryl in the tensor fascia, 0 Vicryl in deep tissue, 2-0 Vicryl subcutaneous tissue, staples on the skin.  Xeroform and Aquacel dressing were applied.  She was taken off the Hana table, awakened, extubated, and taken to the recovery room in stable condition.  All final counts were correct. There were no complications noted.  Of note, Erskine Emery, PA-C, assisted in entire case.  His assistance was crucial for facilitating all aspects of this case.     Lind Guest. [name], M.D.     CYB/MEDQ  D:  03/27/2016  T:  03/28/2016  Job:  [id number]

## 2016-03-28 NOTE — Evaluation (Signed)
Occupational Therapy Evaluation Patient Details Name: Carrie [name] MRN: [id number] DOB: [birth year] Today's Date: 03/28/2016    History of Present Illness 65 y.o. female admitted to [facility] on 03/27/16 for elective L THA.  Pt with significant PMHx of forament magnum meningioma (s/p brain surgery), HTN, DM, and CKD.   Clinical Impression   Pt with decline in function and safety with ADLs and ADL mobility with decreased balance and endurance. Pt would benefit from acute OT services to address impairments to increase level of function and safety    Follow Up Recommendations  Home health OT;Supervision - Intermittent    Equipment Recommendations  3 in 1 bedside comode;Tub/shower seat    Recommendations for Other Services       Precautions / Restrictions Precautions Precautions: Fall Restrictions Weight Bearing Restrictions: Yes LLE Weight Bearing: Weight bearing as tolerated      Mobility Bed Mobility Overal bed mobility: Needs Assistance Bed Mobility: Supine to Sit     Supine to sit: HOB elevated;Min guard     General bed mobility comments: pt up with PT upon entering room  Transfers Overall transfer level: Needs assistance Equipment used: Rolling walker (2 wheeled) Transfers: Sit to/from Stand Sit to Stand: Min assist         General transfer comment: Min assist to support trunk during transitions. Verbal cues for safe hand placement.     Balance Overall balance assessment: Needs assistance Sitting-balance support: Feet supported;No upper extremity supported;Bilateral upper extremity supported Sitting balance-Leahy Scale: Good     Standing balance support: Bilateral upper extremity supported Standing balance-Leahy Scale: Poor                              ADL Overall ADL's : Needs assistance/impaired     Grooming: Wash/dry hands;Wash/dry face;Standing;Min guard   Upper Body Bathing: Set up;Sitting   Lower Body Bathing: Maximal  assistance   Upper Body Dressing : Set up;Sitting   Lower Body Dressing: Maximal assistance   Toilet Transfer: Minimal assistance;Comfort height toilet;RW;Ambulation;Cueing for safety Toilet Transfer Details (indicate cue type and reason): Min assist to support trunk during transitions. Verbal cues for safe hand placement.  Toileting- Clothing Manipulation and Hygiene: Moderate assistance;Sit to/from stand   Tub/ Shower Transfer: Minimal assistance;3 in Risk manager Details (indicate cue type and reason): Min assist to support trunk during transitions. Verbal cues for safe hand placement.  Functional mobility during ADLs: Minimal assistance;Cueing for safety General ADL Comments: Pt and he rsister educated on ADL A/E for home use     Vision Vision Assessment?: No apparent visual deficits              Pertinent Vitals/Pain Pain Assessment: 0-10 Pain Score: 7  Pain Location: L hip Pain Descriptors / Indicators: Aching;Grimacing;Guarding;Sore Pain Intervention(s): Limited activity within patient's tolerance;Monitored during session;Repositioned     Hand Dominance Right   Extremity/Trunk Assessment Upper Extremity Assessment Upper Extremity Assessment: Overall WFL for tasks assessed       Cervical / Trunk Assessment Cervical / Trunk Assessment: Normal   Communication Communication Communication: No difficulties   Cognition Arousal/Alertness: Awake/alert Behavior During Therapy: WFL for tasks assessed/performed Overall Cognitive Status: Within Functional Limits for tasks assessed                     General Comments   pt very pleasant and cooperative  Home Living Family/patient expects to be discharged to:: Private residence Living Arrangements: Other relatives (sisters) Available Help at Discharge: Family;Available 24 hours/day Type of Home: House Home Access: Stairs to enter [employer] of Steps:  2 Entrance Stairs-Rails: None Home Layout: Two level;Able to live on main level with bedroom/bathroom Alternate Level Stairs-Number of Steps: 21   Bathroom Shower/Tub:  (walk in showers on second floor)   Bathroom Toilet: Standard     Home Equipment: Walker - 4 wheels;Shower seat          Prior Functioning/Environment Level of Independence: Independent with assistive device(s)                 OT Problem List: Impaired balance (sitting and/or standing);Pain;Decreased activity tolerance;Decreased knowledge of use of DME or AE   OT Treatment/Interventions: Self-care/ADL training;DME and/or AE instruction;Therapeutic activities;Patient/family education    OT Goals(Current goals can be found in the care plan section) Acute Rehab OT Goals Patient Stated Goal: to go home OT Goal Formulation: With patient/family Time For Goal Achievement: 04/04/16 Potential to Achieve Goals: Good ADL Goals Pt Will Perform Grooming: with supervision;with set-up;standing Pt Will Perform Lower Body Bathing: with mod assist;with caregiver independent in assisting;sit to/from stand Pt Will Perform Lower Body Dressing: with mod assist;with caregiver independent in assisting;sit to/from stand Pt Will Transfer to Toilet: with min guard assist;with supervision;ambulating;grab bars (3 in 1) Pt Will Perform Toileting - Clothing Manipulation and hygiene: with min assist;sit to/from stand;with caregiver independent in assisting Pt Will Perform Tub/Shower Transfer: with min guard assist;3 in 1  OT Frequency: Min 2X/week   Barriers to D/C:    no barriers                     End of Session Equipment Utilized During Treatment: Gait belt;Rolling walker;Other (comment) (3 in 1)  Activity Tolerance: Patient tolerated treatment well Patient left: in chair;with call bell/phone within reach;with family/visitor present   Time: 9794-8016 OT Time Calculation (min): 27 min Charges:  OT General  Charges $OT Visit: 1 Procedure OT Evaluation $OT Eval Moderate Complexity: 1 Procedure OT Treatments $Therapeutic Activity: 8-22 mins G-Codes:    Britt Bottom 03/28/2016, 12:50 PM

## 2016-03-28 NOTE — Progress Notes (Signed)
Subjective: 1 Day Post-Op Procedure(s) (LRB): LEFT TOTAL HIP ARTHROPLASTY ANTERIOR APPROACH (Left) Patient reports pain as moderate.    Objective: Vital signs in last 24 hours: Temp:  [97.4 F (36.3 C)-99.4 F (37.4 C)] 99.4 F (37.4 C) (10/25 0542) Pulse Rate:  [71-100] 100 (10/25 0542) Resp:  [12-20] 16 (10/25 0542) BP: (116-154)/(61-89) 130/67 (10/25 0542) SpO2:  [93 %-100 %] 93 % (10/25 0542) Weight:  [230 lb (104.3 kg)] 230 lb (104.3 kg) (10/24 1021)  Intake/Output from previous day: 10/24 0701 - 10/25 0700 In: 2490 [P.O.:790; I.V.:1400] Out: 2600 [Urine:2400; Blood:200] Intake/Output this shift: No intake/output data recorded.   Recent Labs  03/26/16 1105  HGB 10.4*    Recent Labs  03/26/16 1105  WBC 7.7  RBC 3.91  HCT 32.9*  PLT 355    Recent Labs  03/26/16 1105  NA 140  K 4.1  CL 106  CO2 25  BUN 21*  CREATININE 1.77*  GLUCOSE 95  CALCIUM 9.3   No results for input(s): LABPT, INR in the last 72 hours.  Sensation intact distally Intact pulses distally Dorsiflexion/Plantar flexion intact Incision: dressing C/D/I  Assessment/Plan: 1 Day Post-Op Procedure(s) (LRB): LEFT TOTAL HIP ARTHROPLASTY ANTERIOR APPROACH (Left) Up with therapy  Mcarthur Rossetti 03/28/2016, 7:07 AM

## 2016-03-28 NOTE — Consult Note (Signed)
[facility] CM Primary Care Navigator  03/28/2016  Carrie [name] November 02, 1950 [id number]  Met with patient and sister (Deedee) at the bedside to identify possible discharge needs. Patient states she had been having increased pain to left hip and limited mobility needing a cane to walk which led to this admission/ surgery. She endorses Dr. Viviana Simpler with [facility] as the primary care provider.     Patient shared using [facility] at [facility] to obtain medications without problem. Patient mentioned managing her own medicines at home using "pill box" system.   Sisters (Deedee and Langley Gauss) provides transportation to her doctors' appointments and they will be the primary caregivers at home although patient states having multiple family members living with her who can assist with care needs.  Patient voiced understanding to call primary care provider's office once discharged, for a post discharge follow-up appointment within a week or sooner if needs arise. Patient letter provided as a reminder.  She denies further needs or concerns at this time. She mentioned that her diabetes is well controlled (on Metformin) with a Hgb A1c of 5.8. Patient plans to discharge home this Friday with family's assistance and Home Health services.   For additional questions please contact:  Edwena Felty A. Willim Turnage, BSN, RN-BC [facility] PRIMARY CARE Navigator Cell: [phone]

## 2016-03-28 NOTE — Progress Notes (Signed)
Physical Therapy Treatment Patient Details Name: Carrie [name] MRN: [id number] DOB: [birth year] Today's Date: 03/28/2016    History of Present Illness 65 y.o. female admitted to [facility] on 03/27/16 for elective L THA.  Pt with significant PMHx of forament magnum meningioma (s/p brain surgery), HTN, DM, and CKD.    PT Comments    Pt is limited by fatigue and soreness today, but still progressing her mobility this AM.  She plans to d/c home this Friday with family's assist and HHPT f/u.  PT will continue to follow acutely to progress mobility and independence.   Follow Up Recommendations  Home health PT;Supervision for mobility/OOB     Equipment Recommendations  3in1 (PT) (wide?)    Recommendations for Other Services   NA     Precautions / Restrictions Precautions Precautions: Fall Restrictions LLE Weight Bearing: Weight bearing as tolerated    Mobility  Bed Mobility Overal bed mobility: Needs Assistance Bed Mobility: Supine to Sit     Supine to sit: HOB elevated;Min guard     General bed mobility comments: Min guard assist to help break friction barrier between her leg and the bed, pt attempting to progress left leg EOB on her own.   Transfers Overall transfer level: Needs assistance Equipment used: Rolling walker (2 wheeled) Transfers: Sit to/from Stand Sit to Stand: Min assist         General transfer comment: Min assist to support trunk during transitions. Verbal cues for safe hand placement.   Ambulation/Gait Ambulation/Gait assistance: Min assist Ambulation Distance (Feet): 20 Feet Assistive device: Rolling walker (2 wheeled) Gait Pattern/deviations: Step-to pattern;Antalgic Gait velocity: decreased Gait velocity interpretation: <1.8 ft/sec, indicative of risk for recurrent falls General Gait Details: Moderately antalgic gait pattern and very slow speed (<1.8 ft/sec).  Verbal cues for correct LE sequencing and safe RW use.           Balance Overall  balance assessment: Needs assistance Sitting-balance support: Feet supported;No upper extremity supported;Bilateral upper extremity supported Sitting balance-Leahy Scale: Good     Standing balance support: Bilateral upper extremity supported Standing balance-Leahy Scale: Poor                      Cognition Arousal/Alertness: Awake/alert Behavior During Therapy: WFL for tasks assessed/performed Overall Cognitive Status: Within Functional Limits for tasks assessed                             Pertinent Vitals/Pain Pain Assessment: 0-10 Pain Score: 7  Pain Location: left hip Pain Descriptors / Indicators: Aching;Grimacing;Guarding Pain Intervention(s): Limited activity within patient's tolerance;Monitored during session;Repositioned           PT Goals (current goals can now be found in the care plan section) Acute Rehab PT Goals Patient Stated Goal: to go home Progress towards PT goals: Progressing toward goals    Frequency    7X/week      PT Plan Current plan remains appropriate       End of Session Equipment Utilized During Treatment: Gait belt Activity Tolerance: Patient limited by pain Patient left: in chair;with call bell/phone within reach;with family/visitor present;Other (comment) (with OT in room)     Time: 2841-3244 PT Time Calculation (min) (ACUTE ONLY): 19 min  Charges:  $Gait Training: 8-22 mins            Kayleen Alig B. Naiyah Klostermann, PT, DPT [phone]   03/28/2016, 12:27 PM

## 2016-03-28 NOTE — Progress Notes (Signed)
Physical Therapy Treatment Patient Details Name: Carrie [name] MRN: [id number] DOB: August 01, 1950 Today's Date: 03/28/2016    History of Present Illness 65 y.o. female admitted to [facility] on 03/27/16 for elective L THA.  Pt with significant PMHx of forament magnum meningioma (s/p brain surgery), HTN, DM, and CKD.    PT Comments    Pt performed gait training with cues for proper sequencing. Pt becomes light headed following 25' of gait requiring her to sit down. RN was notified and vitals were assessed and BP was orthostatic compared to previous readings, see vitals flow sheet for details. Assisted client back to bed following rest and reviewed seated exercises. Pt non-symptomatic once returning to bed.   Follow Up Recommendations  Home health PT     Equipment Recommendations  3in1 (PT) (Wide)    Recommendations for Other Services       Precautions / Restrictions Precautions Precautions: Fall Restrictions Weight Bearing Restrictions: Yes LLE Weight Bearing: Weight bearing as tolerated    Mobility  Bed Mobility Overal bed mobility: Needs Assistance Bed Mobility: Supine to Sit;Sit to Supine     Supine to sit: Modified independent (Device/Increase time) Sit to supine: Min assist   General bed mobility comments: Assited placing Left LE back into bed with min A.  Transfers Overall transfer level: Needs assistance Equipment used: Rolling walker (2 wheeled) Transfers: Sit to/from Stand Sit to Stand: Min guard         General transfer comment: From EOB with min guard and from recliner chair was Mod Independnet  Ambulation/Gait Ambulation/Gait assistance: Min guard Ambulation Distance (Feet): 25 Feet Assistive device: Rolling walker (2 wheeled) Gait Pattern/deviations: Step-to pattern;Decreased step length - left Gait velocity: decreased Gait velocity interpretation: <1.8 ft/sec, indicative of risk for recurrent falls General Gait Details: Moderate Antalgic gait pattern  with decreased gait speed. Verbal cues for correct LE sequencing   Stairs            Wheelchair Mobility    Modified Rankin (Stroke Patients Only)       Balance Overall balance assessment: Needs assistance Sitting-balance support: Feet unsupported Sitting balance-Leahy Scale: Good     Standing balance support: Bilateral upper extremity supported Standing balance-Leahy Scale: Poor                      Cognition Arousal/Alertness: Awake/alert Behavior During Therapy: WFL for tasks assessed/performed Overall Cognitive Status: Within Functional Limits for tasks assessed                      Exercises Total Joint Exercises Ankle Circles/Pumps: 20 reps;AROM Quad Sets: 5 reps;AROM Heel Slides: AAROM;10 reps    General Comments        Pertinent Vitals/Pain Pain Assessment: 0-10 Pain Score: 10-Worst pain ever Pain Location: L Hip Pain Descriptors / Indicators: Aching Pain Intervention(s): Limited activity within patient's tolerance;Repositioned;Patient requesting pain meds-RN notified    Home Living Family/patient expects to be discharged to:: Private residence Living Arrangements: Other relatives (sisters) Available Help at Discharge: Family;Available 24 hours/day Type of Home: House Home Access: Stairs to enter Entrance Stairs-Rails: None Home Layout: Two level;Able to live on main level with bedroom/bathroom Home Equipment: Walker - 4 wheels;Shower seat      Prior Function Level of Independence: Independent with assistive device(s)          PT Goals (current goals can now be found in the care plan section) Acute Rehab PT Goals Patient Stated Goal: to go  home Progress towards PT goals: Progressing toward goals    Frequency    7X/week      PT Plan Current plan remains appropriate    Co-evaluation             End of Session Equipment Utilized During Treatment: Gait belt Activity Tolerance: Patient limited by pain Patient  left: in bed;with call bell/phone within reach;with family/visitor present     Time: 1610-9604 PT Time Calculation (min) (ACUTE ONLY): 33 min  Charges:  $Gait Training: 23-37 mins                    G Codes:      Scheryl Marten PT, DPT  [phone] 03/28/2016, 3:41 PM

## 2016-03-29 ENCOUNTER — Encounter (HOSPITAL_COMMUNITY): Payer: Self-pay | Admitting: General Practice

## 2016-03-29 LAB — GLUCOSE, CAPILLARY
GLUCOSE-CAPILLARY: 118 mg/dL — AB (ref 65–99)
GLUCOSE-CAPILLARY: 102 mg/dL — AB (ref 65–99)
Glucose-Capillary: 143 mg/dL — ABNORMAL HIGH (ref 65–99)
Glucose-Capillary: 158 mg/dL — ABNORMAL HIGH (ref 65–99)

## 2016-03-29 MED ORDER — ENSURE ENLIVE PO LIQD
237.0000 mL | Freq: Two times a day (BID) | ORAL | Status: DC
  Administered 2016-03-30 (×2): 237 mL via ORAL

## 2016-03-29 NOTE — Progress Notes (Signed)
Occupational Therapy Treatment Patient Details Name: Carrie [name] MRN: [id number] DOB: 05-29-1951 Today's Date: 03/29/2016    History of present illness 65 y.o. female admitted to [facility] on 03/27/16 for elective L THA.  Pt with significant PMHx of forament magnum meningioma (s/p brain surgery), HTN, DM, and CKD.   OT comments  Pt making good progress with functional goals. Pt participated in ADLs in bathroom seated and standing at sink this session  Follow Up Recommendations  Supervision - Intermittent    Equipment Recommendations  3 in 1 bedside comode;Tub/shower seat    Recommendations for Other Services      Precautions / Restrictions Precautions Precautions: Fall Restrictions Weight Bearing Restrictions: Yes LLE Weight Bearing: Weight bearing as tolerated       Mobility Bed Mobility Overal bed mobility: Needs Assistance Bed Mobility: Supine to Sit     Supine to sit: Modified independent (Device/Increase time)        Transfers Overall transfer level: Needs assistance Equipment used: Rolling walker (2 wheeled) Transfers: Sit to/from Stand Sit to Stand: Min guard         General transfer comment: min guard A from EOB to RW, Mod I from 3 in 1    Balance     Sitting balance-Leahy Scale: Good       Standing balance-Leahy Scale: Fair                     ADL       Grooming: Wash/dry hands;Wash/dry face;Standing;Brushing hair;Supervision/safety;Oral care;Applying deodorant   Upper Body Bathing: Set up;Sitting   Lower Body Bathing: Moderate assistance;With caregiver independent assisting   Upper Body Dressing : Set up;Supervision/safety;Standing   Lower Body Dressing: Moderate assistance;With caregiver independent assisting   Toilet Transfer: Comfort height toilet;RW;Ambulation;Modified Independent   Toileting- Clothing Manipulation and Hygiene: Sit to/from stand;Min guard   Tub/ Shower Transfer: 3 in 1;Ambulation;Supervision/safety;Min  guard;Rolling walker;Grab bars   Functional mobility during ADLs: Min guard;Supervision/safety                  Cognition   Behavior During Therapy: WFL for tasks assessed/performed Overall Cognitive Status: Within Functional Limits for tasks assessed                                                 General Comments  Pt very pleasant, cooperative and motivate; sister is very supportive    Pertinent Vitals/ Pain       Pain Assessment: 0-10 Pain Score: 5  Pain Location: L hip Pain Descriptors / Indicators: Aching;Sore Pain Intervention(s): Monitored during session;Repositioned                                                          Frequency  Min 2X/week        Progress Toward Goals  OT Goals(current goals can now be found in the care plan section)  Progress towards OT goals: Progressing toward goals  Acute Rehab OT Goals Patient Stated Goal: to go home  Plan Discharge plan remains appropriate                     End of Session  Equipment Utilized During Treatment: Rolling walker;Other (comment) (3 in 1)   Activity Tolerance Patient tolerated treatment well   Patient Left in chair;with call bell/phone within reach;with family/visitor present             Time: 6940-9828 OT Time Calculation (min): 28 min  Charges: OT General Charges $OT Visit: 1 Procedure OT Treatments $Self Care/Home Management : 23-37 mins  Britt Bottom 03/29/2016, 12:39 PM

## 2016-03-29 NOTE — Progress Notes (Signed)
Physical Therapy Treatment Patient Details Name: Carrie [name] MRN: [id number] DOB: 1950/06/16 Today's Date: 03/29/2016    History of Present Illness 65 y.o. female admitted to [facility] on 03/27/16 for elective L THA.  Pt with significant PMHx of forament magnum meningioma (s/p brain surgery), HTN, DM, and CKD.    PT Comments    Pt continues to be limited by pre-syncopal symptoms of severe lightheadedness and nausea after/during gait.  Pt walked a bit further this afternoon, but had chair to follow for safety and ended up with nausea and lightheadedness at the end of the walk.  BP supine 123/62, HR 102, BP seated EOB at the end of the walk 96/60, HR 98 bpm.  PT will continue to follow acutely to safely progress mobility.    Follow Up Recommendations  Home health PT     Equipment Recommendations  3in1 (PT)    Recommendations for Other Services   NA     Precautions / Restrictions Precautions Precautions: Fall;Other (comment) Precaution Comments: monitor BP due to lightheadedness and nausea during gait. Restrictions Weight Bearing Restrictions: Yes LLE Weight Bearing: Weight bearing as tolerated    Mobility  Bed Mobility Overal bed mobility: Needs Assistance Bed Mobility: Supine to Sit     Supine to sit: Modified independent (Device/Increase time);HOB elevated Sit to supine: Min assist   General bed mobility comments: Mod I to use bed rail and HOB elevated to get EOB, assist needed to lift left leg back into bed when returning to supine.   Transfers Overall transfer level: Needs assistance Equipment used: Rolling walker (2 wheeled) Transfers: Sit to/from Stand Sit to Stand: Supervision         General transfer comment: close supervision for safety, verbal cues for safe hand placement.   Ambulation/Gait Ambulation/Gait assistance: Min guard;+2 safety/equipment Ambulation Distance (Feet): 35 Feet Assistive device: Rolling walker (2 wheeled) Gait Pattern/deviations:  Step-to pattern;Antalgic Gait velocity: decreased Gait velocity interpretation: <1.8 ft/sec, indicative of risk for recurrent falls General Gait Details: Moderately antalgic gait pattern, verbal cues for safe use of RW, chair to follow to encourage increased gait distance and safety due to pt reports of lightheadedness and pre-syncopal (BP drop during gait today-see vitals flow sheet).            Balance Overall balance assessment: Needs assistance Sitting-balance support: Feet supported;Bilateral upper extremity supported;Single extremity supported;No upper extremity supported Sitting balance-Leahy Scale: Good     Standing balance support: Bilateral upper extremity supported Standing balance-Leahy Scale: Poor Standing balance comment: needs external support of RW or light assistance from PT                    Cognition Arousal/Alertness: Awake/alert Behavior During Therapy: [facility] for tasks assessed/performed Overall Cognitive Status: Within Functional Limits for tasks assessed                      Exercises Total Joint Exercises Ankle Circles/Pumps: AROM;Both;20 reps Quad Sets: AROM;Left;10 reps Short Arc Quad: AROM;Left;10 reps Heel Slides: AAROM;Left;10 reps Hip ABduction/ADduction: AAROM;Left;10 reps        Pertinent Vitals/Pain Pain Assessment: 0-10 Pain Score: 10-Worst pain ever Pain Location: left hip Pain Descriptors / Indicators: Grimacing;Guarding Pain Intervention(s): Limited activity within patient's tolerance;Monitored during session;Repositioned;Ice applied;RN gave pain meds during session           PT Goals (current goals can now be found in the care plan section) Acute Rehab PT Goals Patient Stated Goal: to go home  Progress towards PT goals: Progressing toward goals    Frequency    7X/week      PT Plan Current plan remains appropriate       End of Session Equipment Utilized During Treatment: Gait belt Activity Tolerance:  Other (comment) (pt limited by lightheadedness again this session) Patient left: in bed;with call bell/phone within reach;with family/visitor present     Time: 0929-5747 PT Time Calculation (min) (ACUTE ONLY): 32 min  Charges:  $Gait Training: 8-22 mins $Therapeutic Exercise: 8-22 mins                      Akiem Urieta B. Marieanne Marxen, PT, DPT [phone]   03/29/2016, 4:43 PM

## 2016-03-29 NOTE — Progress Notes (Signed)
Subjective: 2 Days Post-Op Procedure(s) (LRB): LEFT TOTAL HIP ARTHROPLASTY ANTERIOR APPROACH (Left) Patient reports pain as moderate.  Hypotensive yesterday with PT. Denies chest pain, SOB or dizziness.   Objective: Vital signs in last 24 hours: Temp:  [98.7 F (37.1 C)-100.2 F (37.9 C)] 98.7 F (37.1 C) (10/26 0434) Pulse Rate:  [102-114] 104 (10/26 0434) Resp:  [18-19] 18 (10/26 0434) BP: (104-166)/(52-86) 104/52 (10/26 0434) SpO2:  [94 %-96 %] 94 % (10/26 0434)  Intake/Output from previous day: 10/25 0701 - 10/26 0700 In: 620 [P.O.:480; I.V.:140] Out: -  Intake/Output this shift: No intake/output data recorded.   Recent Labs  03/26/16 1105 03/28/16 0940  HGB 10.4* 9.8*    Recent Labs  03/26/16 1105 03/28/16 0940  WBC 7.7 11.5*  RBC 3.91 3.65*  HCT 32.9* 30.6*  PLT 355 253    Recent Labs  03/26/16 1105 03/28/16 0940  NA 140 138  K 4.1 4.2  CL 106 105  CO2 25 21*  BUN 21* 24*  CREATININE 1.77* 1.83*  GLUCOSE 95 162*  CALCIUM 9.3 8.6*   No results for input(s): LABPT, INR in the last 72 hours.  Incision: dressing C/D/I Left calf supple  Tamela Oddi /plantar flexion intact left foot Assessment/Plan: 2 Days Post-Op Procedure(s) (LRB): LEFT TOTAL HIP ARTHROPLASTY ANTERIOR APPROACH (Left) Up with therapy  Discharge to home tomorrow Monitor BP and for symptoms of anemia   Krista Godsil 03/29/2016, 10:14 AM

## 2016-03-29 NOTE — Progress Notes (Signed)
Physical Therapy Treatment Patient Details Name: Carrie [name] MRN: [id number] DOB: July 13, 1950 Today's Date: 03/29/2016    History of Present Illness 65 y.o. female admitted to [facility] on 03/27/16 for elective L THA.  Pt with significant PMHx of forament magnum meningioma (s/p brain surgery), HTN, DM, and CKD.    PT Comments    Pt monitored throughout therapy session for orthostatic symptoms especially with gait and standing activities. Performed gait with wc follow x 2 as client becomes symptomatic following first attempt requiring rest break and vitals check. See vitals sign section for details. Assisted client to bathroom and requires assistance to perform sit to stand from commode seat, client less symptomatic with second gait attempt.    Follow Up Recommendations  Home health PT     Equipment Recommendations  3in1 (PT)    Recommendations for Other Services       Precautions / Restrictions Precautions Precautions: Fall Restrictions Weight Bearing Restrictions: Yes LLE Weight Bearing: Weight bearing as tolerated    Mobility  Bed Mobility Overal bed mobility: Needs Assistance Bed Mobility: Supine to Sit     Supine to sit: Modified independent (Device/Increase time)        Transfers Overall transfer level: Needs assistance Equipment used: Rolling walker (2 wheeled) Transfers: Sit to/from Stand Sit to Stand: Min guard         General transfer comment: Min guard from recliner chair due to orthstatic symptoms  Ambulation/Gait Ambulation/Gait assistance: Min guard Ambulation Distance (Feet): 25 Feet Assistive device: Rolling walker (2 wheeled) Gait Pattern/deviations: Step-to pattern;Decreased stance time - left;Decreased step length - right;Decreased step length - left;Antalgic Gait velocity: decreased Gait velocity interpretation: <1.8 ft/sec, indicative of risk for recurrent falls General Gait Details: moderate antalgic gait pattern with decreased step  length and stance on left. Requires monitoring for symtpoms of lowering bp.   Stairs            Wheelchair Mobility    Modified Rankin (Stroke Patients Only)       Balance Overall balance assessment: Needs assistance Sitting-balance support: Feet supported Sitting balance-Leahy Scale: Good     Standing balance support: Bilateral upper extremity supported Standing balance-Leahy Scale: Good                      Cognition Arousal/Alertness: Awake/alert Behavior During Therapy: WFL for tasks assessed/performed Overall Cognitive Status: Within Functional Limits for tasks assessed                      Exercises      General Comments        Pertinent Vitals/Pain Pain Assessment: 0-10 Pain Score: 2  (2/10 at rest and reports 10/10 with weight bearing and gait) Pain Location: L Hip Pain Descriptors / Indicators: Aching Pain Intervention(s): Monitored during session;Ice applied;Repositioned    Home Living                      Prior Function            PT Goals (current goals can now be found in the care plan section) Acute Rehab PT Goals Patient Stated Goal: to go home Progress towards PT goals: Progressing toward goals    Frequency    7X/week      PT Plan Current plan remains appropriate    Co-evaluation             End of Session Equipment Utilized During Treatment: Gait belt  Activity Tolerance: Other (comment) (pt limited by lowering bp with standing activities) Patient left: in chair;with call bell/phone within reach;with family/visitor present     Time: 1131-1206 PT Time Calculation (min) (ACUTE ONLY): 35 min  Charges:  $Gait Training: 23-37 mins                    G Codes:      Cahterine Heinzel Sloan Leiter 04-16-2016, 2:43 PM

## 2016-03-30 LAB — GLUCOSE, CAPILLARY
Glucose-Capillary: 110 mg/dL — ABNORMAL HIGH (ref 65–99)
Glucose-Capillary: 149 mg/dL — ABNORMAL HIGH (ref 65–99)

## 2016-03-30 MED ORDER — OXYCODONE-ACETAMINOPHEN 5-325 MG PO TABS: 1.0000 | ORAL_TABLET | ORAL | 0 refills | Status: DC | PRN

## 2016-03-30 MED ORDER — RIVAROXABAN 10 MG PO TABS: 10.0000 mg | ORAL_TABLET | Freq: Every day | ORAL | 0 refills | Status: DC

## 2016-03-30 MED ORDER — METHOCARBAMOL 500 MG PO TABS: 500.0000 mg | ORAL_TABLET | Freq: Four times a day (QID) | ORAL | 0 refills | Status: DC | PRN

## 2016-03-30 NOTE — Care Management Important Message (Signed)
Important Message  Patient Details  Name: Carrie [name] MRN: [id number] Date of Birth: [birth year]   Medicare Important Message Given:       Orbie Pyo 03/30/2016, 3:01 PM

## 2016-03-30 NOTE — Discharge Summary (Signed)
Patient ID: Carrie [name] MRN: [id number] DOB/AGE: 11-10-1950 65 y.o.  Admit date: 03/27/2016 Discharge date: 03/30/2016  Admission Diagnoses:  Principal Problem:   Unilateral primary osteoarthritis, left hip Active Problems:   Status post left hip replacement   Discharge Diagnoses:  Same  Past Medical History:  Diagnosis Date  . Allergy   . Arthritis    "right knee" (10/26'/2017)  . CKD (chronic kidney disease), stage III    12/2015 (Dr. Lavonia Dana)  . Depression   . History of blood transfusion 2008   "when I had brain tumor surgery"  . History of MRSA infection 2008  . Hypertension   . Meningioma ([place])    Foramen magnum  . Type II diabetes mellitus ([place]) 2011    Surgeries: Procedure(s): LEFT TOTAL HIP ARTHROPLASTY ANTERIOR APPROACH on 03/27/2016   Consultants:   Discharged Condition: Improved  Hospital Course: ALEXANDREA WESTERGARD is an 65 y.o. female who was admitted 03/27/2016 for operative treatment ofUnilateral primary osteoarthritis, left hip. Patient has severe unremitting pain that affects sleep, daily activities, and work/hobbies. After pre-op clearance the patient was taken to the operating room on 03/27/2016 and underwent  Procedure(s): LEFT TOTAL HIP ARTHROPLASTY ANTERIOR APPROACH.    Patient was given perioperative antibiotics: Anti-infectives    Start     Dose/Rate Route Frequency Ordered Stop   03/27/16 1930  ceFAZolin (ANCEF) IVPB 1 g/50 mL premix     1 g 100 mL/hr over 30 Minutes Intravenous Every 6 hours 03/27/16 1627 03/28/16 0307   03/27/16 1026  ceFAZolin (ANCEF) 2-4 GM/100ML-% IVPB    Comments:  Merryl Hacker   : cabinet override      03/27/16 1026 03/27/16 1301   03/27/16 1024  ceFAZolin (ANCEF) IVPB 2g/100 mL premix     2 g 200 mL/hr over 30 Minutes Intravenous On call to O.R. 03/27/16 1024 03/27/16 1301       Patient was given sequential compression devices, early ambulation, and chemoprophylaxis to prevent DVT.  Patient  benefited maximally from hospital stay and there were no complications.    Recent vital signs: Patient Vitals for the past 24 hrs:  BP Temp Temp src Pulse Resp SpO2  03/30/16 1500 (!) 112/58 98.5 F (36.9 C) Oral 100 18 99 %  03/30/16 1145 102/61 - - 100 18 99 %  03/30/16 0852 (!) 106/51 - - - - -  03/30/16 0438 (!) 106/51 98.8 F (37.1 C) Oral 99 18 97 %  03/29/16 2009 101/62 98.1 F (36.7 C) Oral (!) 111 18 95 %     Recent laboratory studies:  Recent Labs  03/28/16 0940  WBC 11.5*  HGB 9.8*  HCT 30.6*  PLT 253  NA 138  K 4.2  CL 105  CO2 21*  BUN 24*  CREATININE 1.83*  GLUCOSE 162*  CALCIUM 8.6*     Discharge Medications:     Medication List    TAKE these medications   acetaminophen 500 MG tablet Commonly known as:  TYLENOL Take 1,000 mg by mouth every 6 (six) hours as needed for mild pain.   amLODipine 10 MG tablet Commonly known as:  NORVASC TAKE 1 TABLET EVERY DAY   atorvastatin 40 MG tablet Commonly known as:  LIPITOR TAKE ONE TABLET BY MOUTH ONE TIME DAILY   furosemide 40 MG tablet Commonly known as:  LASIX Take 40 mg by mouth daily.   levETIRAcetam 1000 MG tablet Commonly known as:  KEPPRA TAKE ONE TABLET BY MOUTH TWICE DAILY  LORazepam 0.5 MG tablet Commonly known as:  ATIVAN TAKE 1-2 TABLET BY MOUTH 3 TIMES A DAY AS NEEDED FOR NERVES What changed:  how much to take  how to take this  when to take this  reasons to take this  additional instructions   losartan-hydrochlorothiazide 50-12.5 MG tablet Commonly known as:  HYZAAR TAKE ONE TABLET BY MOUTH ONE TIME DAILY   metFORMIN 500 MG tablet Commonly known as:  GLUCOPHAGE TAKE 1 TABLET (500 MG TOTAL) BY MOUTH 2 (TWO) TIMES DAILY WITH A MEAL.   methocarbamol 500 MG tablet Commonly known as:  ROBAXIN Take 1 tablet (500 mg total) by mouth every 6 (six) hours as needed for muscle spasms.   oxyCODONE-acetaminophen 5-325 MG tablet Commonly known as:  ROXICET Take 1-2 tablets by  mouth every 4 (four) hours as needed.   rivaroxaban 10 MG Tabs tablet Commonly known as:  XARELTO Take 1 tablet (10 mg total) by mouth daily with breakfast.   traMADol 50 MG tablet Commonly known as:  ULTRAM TAKE 1/2 TO 1 TABLET TWICE A DAY AS NEEDE What changed:  how much to take  how to take this  when to take this  reasons to take this  additional instructions   Vitamin D (Ergocalciferol) 50000 units Caps capsule Commonly known as:  DRISDOL TAKE 1 CAPSULE BY MOUTH ONCE A MONTH       Diagnostic Studies: Dg C-arm 1-60 Min  Result Date: 03/27/2016 CLINICAL DATA:  Left hip arthroplasty EXAM: OPERATIVE left HIP (WITH PELVIS IF PERFORMED) 2 VIEWS TECHNIQUE: Fluoroscopic spot image(s) were submitted for interpretation post-operatively. COMPARISON:  None. FINDINGS: Two views of the left hip submitted. There is left hip prosthesis with anatomic alignment. Fluoroscopy time 23 seconds.  Please see the operative report. IMPRESSION: Left hip prosthesis with anatomic alignment. Electronically Signed   By: Lahoma Crocker M.D.   On: 03/27/2016 15:10   Dg Hip Port Unilat With Pelvis 1v Left  Result Date: 03/27/2016 CLINICAL DATA:  Postop day 0 left total hip arthroplasty for osteoarthritis. EXAM: DG HIP (WITH OR WITHOUT PELVIS) 1V PORTABLE LEFT 3:13 p.m.: COMPARISON:  Intraoperative examination earlier today 2:07 p.m. Left hip x-rays yesterday. FINDINGS: Anatomic alignment post left total hip arthroplasty. No acute complicating features. Moderate joint space narrowing in the contralateral right hip in the AP projection. IMPRESSION: Anatomic alignment post left total hip arthroplasty without acute complicating features. Electronically Signed   By: Evangeline Dakin M.D.   On: 03/27/2016 16:10   Dg Hip Operative Unilat With Pelvis Left  Result Date: 03/27/2016 CLINICAL DATA:  Left hip arthroplasty EXAM: OPERATIVE left HIP (WITH PELVIS IF PERFORMED) 2 VIEWS TECHNIQUE: Fluoroscopic spot image(s)  were submitted for interpretation post-operatively. COMPARISON:  None. FINDINGS: Two views of the left hip submitted. There is left hip prosthesis with anatomic alignment. Fluoroscopy time 23 seconds.  Please see the operative report. IMPRESSION: Left hip prosthesis with anatomic alignment. Electronically Signed   By: Lahoma Crocker M.D.   On: 03/27/2016 15:10   Dg Hip Unilat W Or W/o Pelvis 2-3 Views Left  Result Date: 03/05/2016 CLINICAL DATA:  Left hip and groin pain EXAM: DG HIP (WITH OR WITHOUT PELVIS) 2-3V LEFT COMPARISON:  None. FINDINGS: The pelvic ring is intact. Bilateral degenerative changes of the hip joints are noted much more noted on the left with loss of the superior joint space and subchondral sclerosis and cyst formation. No definitive joint effusion is seen. No soft tissue abnormality is noted. IMPRESSION: Degenerative changes  of the hip joints bilaterally much more severe on the left than the right. Electronically Signed   By: Inez Catalina M.D.   On: 03/05/2016 16:43    Disposition: 01-Home or Self Care  Discharge Instructions    Call MD / Call 911    Complete by:  As directed    If you experience chest pain or shortness of breath, CALL 911 and be transported to the hospital emergency room.  If you develope a fever above 101 F, pus (white drainage) or increased drainage or redness at the wound, or calf pain, call your surgeon's office.   Constipation Prevention    Complete by:  As directed    Drink plenty of fluids.  Prune juice may be helpful.  You may use a stool softener, such as Colace (over the counter) 100 mg twice a day.  Use MiraLax (over the counter) for constipation as needed.   Diet - low sodium heart healthy    Complete by:  As directed    Discharge patient    Complete by:  As directed    Increase activity slowly as tolerated    Complete by:  As directed       Follow-up Information    [facility] .   Specialty:  Home Health Services Why:  Home Health  Physical Therapy Contact information: [place] [address] [phone]        Mcarthur Rossetti, MD Follow up in 2 week(s).   Specialty:  Orthopedic Surgery Contact information: [address] [place] [address] [phone]            Signed: Mcarthur Rossetti 03/30/2016, 5:14 PM

## 2016-03-30 NOTE — Progress Notes (Signed)
Went over discharge papers and medications with family with full understanding,

## 2016-03-30 NOTE — Discharge Instructions (Signed)
Information on my medicine - XARELTO (Rivaroxaban)  This medication education was reviewed with me or my healthcare representative as part of my discharge preparation.  The pharmacist that spoke with me during my hospital stay was:  Eudelia Bunch, [facility]  Why was Xarelto prescribed for you? Xarelto was prescribed for you to reduce the risk of blood clots forming after orthopedic surgery. The medical term for these abnormal blood clots is venous thromboembolism (VTE).  What do you need to know about xarelto ? Take your Xarelto ONCE DAILY at the same time every day. You may take it either with or without food.  If you have difficulty swallowing the tablet whole, you may crush it and mix in applesauce just prior to taking your dose.   INSTRUCTIONS AFTER JOINT REPLACEMENT   o Remove items at home which could result in a fall. This includes throw rugs or furniture in walking pathways o ICE to the affected joint every three hours while awake for 30 minutes at a time, for at least the first 3-5 days, and then as needed for pain and swelling.  Continue to use ice for pain and swelling. You may notice swelling that will progress down to the foot and ankle.  This is normal after surgery.  Elevate your leg when you are not up walking on it.   o Continue to use the breathing machine you got in the hospital (incentive spirometer) which will help keep your temperature down.  It is common for your temperature to cycle up and down following surgery, especially at night when you are not up moving around and exerting yourself.  The breathing machine keeps your lungs expanded and your temperature down.   DIET:  As you were doing prior to hospitalization, we recommend a well-balanced diet.  DRESSING / WOUND CARE / SHOWERING  Keep the surgical dressing until follow up.  The dressing is water proof, so you can shower without any extra covering.  IF THE DRESSING FALLS OFF or the wound gets wet inside, change  the dressing with sterile gauze.  Please use good hand washing techniques before changing the dressing.  Do not use any lotions or creams on the incision until instructed by your surgeon.    ACTIVITY  o Increase activity slowly as tolerated, but follow the weight bearing instructions below.   o No driving for 6 weeks or until further direction given by your physician.  You cannot drive while taking narcotics.  o No lifting or carrying greater than 10 lbs. until further directed by your surgeon. o Avoid periods of inactivity such as sitting longer than an hour when not asleep. This helps prevent blood clots.  o You may return to work once you are authorized by your doctor.     WEIGHT BEARING   Weight bearing as tolerated with assist device (walker, cane, etc) as directed, use it as long as suggested by your surgeon or therapist, typically at least 4-6 weeks.   EXERCISES  Results after joint replacement surgery are often greatly improved when you follow the exercise, range of motion and muscle strengthening exercises prescribed by your doctor. Safety measures are also important to protect the joint from further injury. Any time any of these exercises cause you to have increased pain or swelling, decrease what you are doing until you are comfortable again and then slowly increase them. If you have problems or questions, call your caregiver or physical therapist for advice.   Rehabilitation is important following  a joint replacement. After just a few days of immobilization, the muscles of the leg can become weakened and shrink (atrophy).  These exercises are designed to build up the tone and strength of the thigh and leg muscles and to improve motion. Often times heat used for twenty to thirty minutes before working out will loosen up your tissues and help with improving the range of motion but do not use heat for the first two weeks following surgery (sometimes heat can increase post-operative  swelling).   These exercises can be done on a training (exercise) mat, on the floor, on a table or on a bed. Use whatever works the best and is most comfortable for you.    Use music or television while you are exercising so that the exercises are a pleasant break in your day. This will make your life better with the exercises acting as a break in your routine that you can look forward to.   Perform all exercises about fifteen times, three times per day or as directed.  You should exercise both the operative leg and the other leg as well.  Exercises include:    Quad Sets - Tighten up the muscle on the front of the thigh (Quad) and hold for 5-10 seconds.    Straight Leg Raises - With your knee straight (if you were given a brace, keep it on), lift the leg to 60 degrees, hold for 3 seconds, and slowly lower the leg.  Perform this exercise against resistance later as your leg gets stronger.   Leg Slides: Lying on your back, slowly slide your foot toward your buttocks, bending your knee up off the floor (only go as far as is comfortable). Then slowly slide your foot back down until your leg is flat on the floor again.   Angel Wings: Lying on your back spread your legs to the side as far apart as you can without causing discomfort.   Hamstring Strength:  Lying on your back, push your heel against the floor with your leg straight by tightening up the muscles of your buttocks.  Repeat, but this time bend your knee to a comfortable angle, and push your heel against the floor.  You may put a pillow under the heel to make it more comfortable if necessary.   A rehabilitation program following joint replacement surgery can speed recovery and prevent re-injury in the future due to weakened muscles. Contact your doctor or a physical therapist for more information on knee rehabilitation.    CONSTIPATION  Constipation is defined medically as fewer than three stools per week and severe constipation as less than  one stool per week.  Even if you have a regular bowel pattern at home, your normal regimen is likely to be disrupted due to multiple reasons following surgery.  Combination of anesthesia, postoperative narcotics, change in appetite and fluid intake all can affect your bowels.   YOU MUST use at least one of the following options; they are listed in order of increasing strength to get the job done.  They are all available over the counter, and you may need to use some, POSSIBLY even all of these options:    Drink plenty of fluids (prune juice may be helpful) and high fiber foods Colace 100 mg by mouth twice a day  Senokot for constipation as directed and as needed Dulcolax (bisacodyl), take with full glass of water  Miralax (polyethylene glycol) once or twice a day as needed.  If you  have tried all these things and are unable to have a bowel movement in the first 3-4 days after surgery call either your surgeon or your primary doctor.    If you experience loose stools or diarrhea, hold the medications until you stool forms back up.  If your symptoms do not get better within 1 week or if they get worse, check with your doctor.  If you experience "the worst abdominal pain ever" or develop nausea or vomiting, please contact the office immediately for further recommendations for treatment.   ITCHING:  If you experience itching with your medications, try taking only a single pain pill, or even half a pain pill at a time.  You can also use Benadryl over the counter for itching or also to help with sleep.   TED HOSE STOCKINGS:  Use stockings on both legs until for at least 2 weeks or as directed by physician office. They may be removed at night for sleeping.  MEDICATIONS:  See your medication summary on the After Visit Summary that nursing will review with you.  You may have some home medications which will be placed on hold until you complete the course of blood thinner medication.  It is important for  you to complete the blood thinner medication as prescribed.  PRECAUTIONS:  If you experience chest pain or shortness of breath - call 911 immediately for transfer to the hospital emergency department.   If you develop a fever greater that 101 F, purulent drainage from wound, increased redness or drainage from wound, foul odor from the wound/dressing, or calf pain - CONTACT YOUR SURGEON.                                                   FOLLOW-UP APPOINTMENTS:  If you do not already have a post-op appointment, please call the office for an appointment to be seen by your surgeon.  Guidelines for how soon to be seen are listed in your After Visit Summary, but are typically between 1-4 weeks after surgery.  OTHER INSTRUCTIONS:   Knee Replacement:  Do not place pillow under knee, focus on keeping the knee straight while resting. CPM instructions: 0-90 degrees, 2 hours in the morning, 2 hours in the afternoon, and 2 hours in the evening. Place foam block, curve side up under heel at all times except when in CPM or when walking.  DO NOT modify, tear, cut, or change the foam block in any way.  MAKE SURE YOU:   Understand these instructions.   Get help right away if you are not doing well or get worse.    Thank you for letting us be a part of your medical care team.  It is a privilege we respect greatly.  We hope these instructions will help you stay on track for a fast and full recovery!   Take Xarelto exactly as prescribed by your doctor and DO NOT stop taking Xarelto without talking to the doctor who prescribed the medication.  Stopping without other VTE prevention medication to take the place of Xarelto may increase your risk of developing a clot.  After discharge, you should have regular check-up appointments with your healthcare provider that is prescribing your Xarelto.    What do you do if you miss a dose? If you miss a dose, take it as soon as  you remember on the same day then  continue your regularly scheduled once daily regimen the next day. Do not take two doses of Xarelto on the same day.   Important Safety Information A possible side effect of Xarelto is bleeding. You should call your healthcare provider right away if you experience any of the following: ? Bleeding from an injury or your nose that does not stop. ? Unusual colored urine (red or dark brown) or unusual colored stools (red or black). ? Unusual bruising for unknown reasons. ? A serious fall or if you hit your head (even if there is no bleeding).  Some medicines may interact with Xarelto and might increase your risk of bleeding while on Xarelto. To help avoid this, consult your healthcare provider or pharmacist prior to using any new prescription or non-prescription medications, including herbals, vitamins, non-steroidal anti-inflammatory drugs (NSAIDs) and supplements.  This website has more information on Xarelto: [email or web address]

## 2016-03-30 NOTE — Care Management Important Message (Signed)
Important Message  Patient Details  Name: Carrie [name] MRN: [id number] Date of Birth: [birth year]   Medicare Important Message Given:  Yes    Arianni Gallego 03/30/2016, 3:02 PM

## 2016-03-30 NOTE — Progress Notes (Signed)
Subjective: 3 Days Post-Op Procedure(s) (LRB): LEFT TOTAL HIP ARTHROPLASTY ANTERIOR APPROACH (Left) Patient reports pain as moderate.    Objective: Vital signs in last 24 hours: Temp:  [98.1 F (36.7 C)-98.8 F (37.1 C)] 98.8 F (37.1 C) (10/27 0438) Pulse Rate:  [98-111] 99 (10/27 0438) Resp:  [18] 18 (10/27 0438) BP: (96-123)/(45-62) 106/51 (10/27 0438) SpO2:  [95 %-97 %] 97 % (10/27 0438)  Intake/Output from previous day: 10/26 0701 - 10/27 0700 In: 360 [P.O.:360] Out: -  Intake/Output this shift: No intake/output data recorded.   Recent Labs  03/28/16 0940  HGB 9.8*    Recent Labs  03/28/16 0940  WBC 11.5*  RBC 3.65*  HCT 30.6*  PLT 253    Recent Labs  03/28/16 0940  NA 138  K 4.2  CL 105  CO2 21*  BUN 24*  CREATININE 1.83*  GLUCOSE 162*  CALCIUM 8.6*   No results for input(s): LABPT, INR in the last 72 hours.  Sensation intact distally Intact pulses distally Dorsiflexion/Plantar flexion intact Incision: scant drainage  Assessment/Plan: 3 Days Post-Op Procedure(s) (LRB): LEFT TOTAL HIP ARTHROPLASTY ANTERIOR APPROACH (Left) Up with therapy Discharge home with home health today.  Mcarthur Rossetti 03/30/2016, 6:41 AM

## 2016-03-30 NOTE — Progress Notes (Signed)
Physical Therapy Treatment Patient Details Name: Carrie [name] MRN: [id number] DOB: 29-Jan-1951 Today's Date: 03/30/2016    History of Present Illness 65 y.o. female admitted to [facility] on 03/27/16 for elective L THA.  Pt with significant PMHx of forament magnum meningioma (s/p brain surgery), HTN, DM, and CKD.    PT Comments    Patient presents with lower BP this visit (see vitals record) but is able to participate in therapy. Performed stair training to simulate return home with cues for hand placement. Instructed patient to continue monitoring self for symptoms of lower BP with transitional movements. Patient is able to increase gait distance and perform transfers with decreased assistance this visit.    Follow Up Recommendations  Home health PT     Equipment Recommendations  3in1 (PT)    Recommendations for Other Services       Precautions / Restrictions Precautions Precautions: Fall;Other (comment) Precaution Comments: monitor BP prior to treatment and assess throughout due to orthostatic tendencies Restrictions Weight Bearing Restrictions: Yes LLE Weight Bearing: Weight bearing as tolerated    Mobility  Bed Mobility                  Transfers Overall transfer level: Needs assistance Equipment used: Rolling walker (2 wheeled) Transfers: Sit to/from Stand Sit to Stand: Supervision         General transfer comment: close supervision for safety, verbal cues for hand placement on chair  Ambulation/Gait Ambulation/Gait assistance: Min guard;+2 safety/equipment Ambulation Distance (Feet): 100 Feet Assistive device: Rolling walker (2 wheeled) Gait Pattern/deviations: Step-to pattern Gait velocity: decreased Gait velocity interpretation: <1.8 ft/sec, indicative of risk for recurrent falls General Gait Details: Moderately antalgic gait pattern, verbal cues for safe use of RW, chair to follow to encourage increased gait distance and safety due to pt reports of  lightheadedness and pre-syncopal (BP drop during gait today-see vitals flow sheet).     Stairs   Stairs assistance: Min assist Stair Management: One rail Left;Step to pattern;Forwards Number of Stairs: 3 General stair comments: Assisted client on narrow step height and then again on taller step height in order to simulate return home stair negotiation. Requires 1 person in front and 1 behind for safety going into home.   Wheelchair Mobility    Modified Rankin (Stroke Patients Only)       Balance Overall balance assessment: Needs assistance Sitting-balance support: Feet supported Sitting balance-Leahy Scale: Good     Standing balance support: Bilateral upper extremity supported Standing balance-Leahy Scale: Fair Standing balance comment: need exertnal support of RW or light assistance                    Cognition Arousal/Alertness: Awake/alert Behavior During Therapy: WFL for tasks assessed/performed Overall Cognitive Status: Within Functional Limits for tasks assessed                      Exercises Total Joint Exercises Hip ABduction/ADduction: AROM;10 reps;Standing Knee Flexion: AROM;10 reps;Standing Marching in Standing: AROM;10 reps;Standing Standing Hip Extension: AROM;10 reps;Standing    General Comments        Pertinent Vitals/Pain Pain Assessment: Faces Faces Pain Scale: Hurts a little bit Pain Location: Left HIp Pain Descriptors / Indicators: Grimacing;Moaning Pain Intervention(s): Limited activity within patient's tolerance;Repositioned    Home Living                      Prior Function  PT Goals (current goals can now be found in the care plan section) Acute Rehab PT Goals Patient Stated Goal: to go home Progress towards PT goals: Progressing toward goals    Frequency    7X/week      PT Plan Current plan remains appropriate    Co-evaluation             End of Session Equipment Utilized During  Treatment: Gait belt Activity Tolerance: Patient limited by pain;Other (comment) (Require rest breaks inbetween standing exercises due to pain) Patient left: in chair;with call bell/phone within reach;with family/visitor present     Time: 1138-1208 PT Time Calculation (min) (ACUTE ONLY): 30 min  Charges:  $Gait Training: 8-22 mins $Therapeutic Exercise: 8-22 mins                    G Codes:      Elif Yonts Sloan Leiter 04/23/2016, 12:25 PM

## 2016-03-31 DIAGNOSIS — I10 Essential (primary) hypertension: Secondary | ICD-10-CM | POA: Diagnosis not present

## 2016-03-31 DIAGNOSIS — F39 Unspecified mood [affective] disorder: Secondary | ICD-10-CM | POA: Diagnosis not present

## 2016-03-31 DIAGNOSIS — E119 Type 2 diabetes mellitus without complications: Secondary | ICD-10-CM | POA: Diagnosis not present

## 2016-03-31 DIAGNOSIS — Z96642 Presence of left artificial hip joint: Secondary | ICD-10-CM | POA: Diagnosis not present

## 2016-03-31 DIAGNOSIS — M1711 Unilateral primary osteoarthritis, right knee: Secondary | ICD-10-CM | POA: Diagnosis not present

## 2016-03-31 DIAGNOSIS — Z471 Aftercare following joint replacement surgery: Secondary | ICD-10-CM | POA: Diagnosis not present

## 2016-04-01 ENCOUNTER — Other Ambulatory Visit: Payer: Self-pay | Admitting: Internal Medicine

## 2016-04-02 ENCOUNTER — Telehealth (INDEPENDENT_AMBULATORY_CARE_PROVIDER_SITE_OTHER): Payer: Self-pay | Admitting: Orthopaedic Surgery

## 2016-04-02 NOTE — Telephone Encounter (Signed)
Joelene Millin @ [facility] request verbal order at home PT for 3 times a week for 2 weeks , 2 times a week for 2 weeks

## 2016-04-03 DIAGNOSIS — I10 Essential (primary) hypertension: Secondary | ICD-10-CM | POA: Diagnosis not present

## 2016-04-03 DIAGNOSIS — Z471 Aftercare following joint replacement surgery: Secondary | ICD-10-CM | POA: Diagnosis not present

## 2016-04-03 DIAGNOSIS — Z96642 Presence of left artificial hip joint: Secondary | ICD-10-CM | POA: Diagnosis not present

## 2016-04-03 DIAGNOSIS — M1711 Unilateral primary osteoarthritis, right knee: Secondary | ICD-10-CM | POA: Diagnosis not present

## 2016-04-03 DIAGNOSIS — F39 Unspecified mood [affective] disorder: Secondary | ICD-10-CM | POA: Diagnosis not present

## 2016-04-03 DIAGNOSIS — E119 Type 2 diabetes mellitus without complications: Secondary | ICD-10-CM | POA: Diagnosis not present

## 2016-04-03 NOTE — Telephone Encounter (Signed)
Left message with Joelene Millin giving her verbal orders

## 2016-04-04 DIAGNOSIS — I10 Essential (primary) hypertension: Secondary | ICD-10-CM | POA: Diagnosis not present

## 2016-04-04 DIAGNOSIS — E119 Type 2 diabetes mellitus without complications: Secondary | ICD-10-CM | POA: Diagnosis not present

## 2016-04-04 DIAGNOSIS — Z471 Aftercare following joint replacement surgery: Secondary | ICD-10-CM | POA: Diagnosis not present

## 2016-04-04 DIAGNOSIS — M1711 Unilateral primary osteoarthritis, right knee: Secondary | ICD-10-CM | POA: Diagnosis not present

## 2016-04-04 DIAGNOSIS — F39 Unspecified mood [affective] disorder: Secondary | ICD-10-CM | POA: Diagnosis not present

## 2016-04-04 DIAGNOSIS — Z96642 Presence of left artificial hip joint: Secondary | ICD-10-CM | POA: Diagnosis not present

## 2016-04-05 DIAGNOSIS — I10 Essential (primary) hypertension: Secondary | ICD-10-CM | POA: Diagnosis not present

## 2016-04-05 DIAGNOSIS — F39 Unspecified mood [affective] disorder: Secondary | ICD-10-CM | POA: Diagnosis not present

## 2016-04-05 DIAGNOSIS — E119 Type 2 diabetes mellitus without complications: Secondary | ICD-10-CM | POA: Diagnosis not present

## 2016-04-05 DIAGNOSIS — Z96642 Presence of left artificial hip joint: Secondary | ICD-10-CM | POA: Diagnosis not present

## 2016-04-05 DIAGNOSIS — Z471 Aftercare following joint replacement surgery: Secondary | ICD-10-CM | POA: Diagnosis not present

## 2016-04-05 DIAGNOSIS — M1711 Unilateral primary osteoarthritis, right knee: Secondary | ICD-10-CM | POA: Diagnosis not present

## 2016-04-09 DIAGNOSIS — M1711 Unilateral primary osteoarthritis, right knee: Secondary | ICD-10-CM | POA: Diagnosis not present

## 2016-04-09 DIAGNOSIS — Z96642 Presence of left artificial hip joint: Secondary | ICD-10-CM | POA: Diagnosis not present

## 2016-04-09 DIAGNOSIS — Z471 Aftercare following joint replacement surgery: Secondary | ICD-10-CM | POA: Diagnosis not present

## 2016-04-09 DIAGNOSIS — F39 Unspecified mood [affective] disorder: Secondary | ICD-10-CM | POA: Diagnosis not present

## 2016-04-09 DIAGNOSIS — I10 Essential (primary) hypertension: Secondary | ICD-10-CM | POA: Diagnosis not present

## 2016-04-09 DIAGNOSIS — E119 Type 2 diabetes mellitus without complications: Secondary | ICD-10-CM | POA: Diagnosis not present

## 2016-04-10 ENCOUNTER — Ambulatory Visit (INDEPENDENT_AMBULATORY_CARE_PROVIDER_SITE_OTHER): Payer: Commercial Managed Care - HMO | Admitting: Physician Assistant

## 2016-04-10 DIAGNOSIS — Z96642 Presence of left artificial hip joint: Secondary | ICD-10-CM

## 2016-04-10 NOTE — Progress Notes (Signed)
Office Visit Note   Patient: Carrie [name]           Date of Birth: [birth year]           MRN: [id number] Visit Date: 04/10/2016              Requested by: Venia Carbon, MD [place],  [id number] PCP: Viviana Simpler, MD   Assessment & Plan: Visit Diagnoses: No diagnosis found.  Plan: Staples removed Steri-Strips applied she'll keep the wound dry especially proximal portion. Wash the wound with the antibacterial soap scar tissue mobilization encouraged. Questions are encouraged and answered. AP pelvis and lateral view of the left hip at return  Follow-Up Instructions: Return in about 4 weeks (around 05/08/2016) for Radiographs.   Orders:  No orders of the defined types were placed in this encounter.  No orders of the defined types were placed in this encounter.     Procedures: No procedures performed   Clinical Data: No additional findings.   Subjective: Chief Complaint  Patient presents with  . Left Hip - Routine Post Op    LT THA 03/27/16. Patient states she is doing well. Ambulates with walker. States her ROM and strength is doing well. She is taking xarelto, percocet, and robaxin.    Patient here today as her first post operative appointment. She states she is doing very well. Her ROM and strength are increasing. She ambulates with a walker today.    Review of Systems No fevers no chills  Objective: Vital Signs: There were no vitals taken for this visit.  Physical Exam  Well-developed well-nourished female in no acute distress  Ortho Exam Left hip incision healing well approximated with staples no signs of infection Left calf supple nontender dorsiflexion and plantarflexion left foot intact Gentle range of motion of the left hip causes no significant pain Specialty Comments:  No specialty comments available.  Imaging: No results found.   PMFS History: Patient Active Problem List   Diagnosis Date Noted  . Unilateral  primary osteoarthritis, left hip 03/27/2016  . Status post left hip replacement 03/27/2016  . Left hip pain 03/05/2016  . Mood disorder ([place]) 04/01/2015  . Dental infection 02/03/2014  . Osteoarthritis of right knee 02/03/2014  . Neck pain 02/03/2014  . Advanced directives, counseling/discussion 02/03/2014  . Routine general medical examination at a health care facility 07/08/2012  . BMI 40.0-44.9, adult ([place]) 07/08/2012  . Diabetes mellitus type 2, controlled, without complications ([place])   . COMMON MIGRAINE 09/27/2006  . HYPERCHOLESTEROLEMIA 09/26/2006  . Essential hypertension, benign 09/26/2006  . ALLERGIC RHINITIS 09/26/2006  . MENINGIOMA 08/03/2006   Past Medical History:  Diagnosis Date  . Allergy   . Arthritis    "right knee" (10/26'/2017)  . CKD (chronic kidney disease), stage III    12/2015 (Dr. Lavonia Dana)  . Depression   . History of blood transfusion 2008   "when I had brain tumor surgery"  . History of MRSA infection 2008  . Hypertension   . Meningioma ([place])    Foramen magnum  . Type II diabetes mellitus ([place]) 2011    Family History  Problem Relation Age of Onset  . Hypertension Other     Strong family history     Past Surgical History:  Procedure Laterality Date  . BACK SURGERY    . BRAIN MENINGIOMA EXCISION  08/2006   Foramina magnum meningioma  . CERVICAL DISCECTOMY  1996   Dr. Alric Seton  .  CESAREAN SECTION  1981  . COLONOSCOPY    . DILATION AND CURETTAGE OF UTERUS    . JOINT REPLACEMENT    . Right wrist x-ray  2007   Deg. at 1st MCP  . TOTAL HIP ARTHROPLASTY Left 03/27/2016   Procedure: LEFT TOTAL HIP ARTHROPLASTY ANTERIOR APPROACH;  Surgeon: Mcarthur Rossetti, MD;  Location: [place];  Service: Orthopedics;  Laterality: Left;  . TUBAL LIGATION  1981   Social History   Occupational History  . 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 Disabled since brain surgery    Social History Main Topics  . Smoking status: Never Smoker  .  Smokeless tobacco: Never Used  . Alcohol use No  . Drug use: No  . Sexual activity: No

## 2016-04-11 ENCOUNTER — Other Ambulatory Visit (INDEPENDENT_AMBULATORY_CARE_PROVIDER_SITE_OTHER): Payer: Self-pay | Admitting: Orthopaedic Surgery

## 2016-04-11 NOTE — Telephone Encounter (Signed)
Please advise 

## 2016-04-12 DIAGNOSIS — Z96642 Presence of left artificial hip joint: Secondary | ICD-10-CM | POA: Diagnosis not present

## 2016-04-12 DIAGNOSIS — M1711 Unilateral primary osteoarthritis, right knee: Secondary | ICD-10-CM | POA: Diagnosis not present

## 2016-04-12 DIAGNOSIS — I10 Essential (primary) hypertension: Secondary | ICD-10-CM | POA: Diagnosis not present

## 2016-04-12 DIAGNOSIS — F39 Unspecified mood [affective] disorder: Secondary | ICD-10-CM | POA: Diagnosis not present

## 2016-04-12 DIAGNOSIS — E119 Type 2 diabetes mellitus without complications: Secondary | ICD-10-CM | POA: Diagnosis not present

## 2016-04-12 DIAGNOSIS — Z471 Aftercare following joint replacement surgery: Secondary | ICD-10-CM | POA: Diagnosis not present

## 2016-04-13 DIAGNOSIS — E119 Type 2 diabetes mellitus without complications: Secondary | ICD-10-CM | POA: Diagnosis not present

## 2016-04-13 DIAGNOSIS — F39 Unspecified mood [affective] disorder: Secondary | ICD-10-CM | POA: Diagnosis not present

## 2016-04-13 DIAGNOSIS — M1711 Unilateral primary osteoarthritis, right knee: Secondary | ICD-10-CM | POA: Diagnosis not present

## 2016-04-13 DIAGNOSIS — Z96642 Presence of left artificial hip joint: Secondary | ICD-10-CM | POA: Diagnosis not present

## 2016-04-13 DIAGNOSIS — Z471 Aftercare following joint replacement surgery: Secondary | ICD-10-CM | POA: Diagnosis not present

## 2016-04-13 DIAGNOSIS — I10 Essential (primary) hypertension: Secondary | ICD-10-CM | POA: Diagnosis not present

## 2016-04-18 DIAGNOSIS — Z96642 Presence of left artificial hip joint: Secondary | ICD-10-CM | POA: Diagnosis not present

## 2016-04-18 DIAGNOSIS — M1711 Unilateral primary osteoarthritis, right knee: Secondary | ICD-10-CM | POA: Diagnosis not present

## 2016-04-18 DIAGNOSIS — F39 Unspecified mood [affective] disorder: Secondary | ICD-10-CM | POA: Diagnosis not present

## 2016-04-18 DIAGNOSIS — I10 Essential (primary) hypertension: Secondary | ICD-10-CM | POA: Diagnosis not present

## 2016-04-18 DIAGNOSIS — Z471 Aftercare following joint replacement surgery: Secondary | ICD-10-CM | POA: Diagnosis not present

## 2016-04-18 DIAGNOSIS — E119 Type 2 diabetes mellitus without complications: Secondary | ICD-10-CM | POA: Diagnosis not present

## 2016-04-20 DIAGNOSIS — Z96642 Presence of left artificial hip joint: Secondary | ICD-10-CM | POA: Diagnosis not present

## 2016-04-20 DIAGNOSIS — F39 Unspecified mood [affective] disorder: Secondary | ICD-10-CM | POA: Diagnosis not present

## 2016-04-20 DIAGNOSIS — Z471 Aftercare following joint replacement surgery: Secondary | ICD-10-CM | POA: Diagnosis not present

## 2016-04-20 DIAGNOSIS — E119 Type 2 diabetes mellitus without complications: Secondary | ICD-10-CM | POA: Diagnosis not present

## 2016-04-20 DIAGNOSIS — M1711 Unilateral primary osteoarthritis, right knee: Secondary | ICD-10-CM | POA: Diagnosis not present

## 2016-04-20 DIAGNOSIS — I10 Essential (primary) hypertension: Secondary | ICD-10-CM | POA: Diagnosis not present

## 2016-04-23 DIAGNOSIS — E119 Type 2 diabetes mellitus without complications: Secondary | ICD-10-CM | POA: Diagnosis not present

## 2016-04-23 DIAGNOSIS — Z96642 Presence of left artificial hip joint: Secondary | ICD-10-CM | POA: Diagnosis not present

## 2016-04-23 DIAGNOSIS — I10 Essential (primary) hypertension: Secondary | ICD-10-CM | POA: Diagnosis not present

## 2016-04-23 DIAGNOSIS — F39 Unspecified mood [affective] disorder: Secondary | ICD-10-CM | POA: Diagnosis not present

## 2016-04-23 DIAGNOSIS — Z471 Aftercare following joint replacement surgery: Secondary | ICD-10-CM | POA: Diagnosis not present

## 2016-04-23 DIAGNOSIS — M1711 Unilateral primary osteoarthritis, right knee: Secondary | ICD-10-CM | POA: Diagnosis not present

## 2016-04-25 DIAGNOSIS — Z471 Aftercare following joint replacement surgery: Secondary | ICD-10-CM | POA: Diagnosis not present

## 2016-04-25 DIAGNOSIS — Z96642 Presence of left artificial hip joint: Secondary | ICD-10-CM | POA: Diagnosis not present

## 2016-04-25 DIAGNOSIS — I10 Essential (primary) hypertension: Secondary | ICD-10-CM | POA: Diagnosis not present

## 2016-04-25 DIAGNOSIS — F39 Unspecified mood [affective] disorder: Secondary | ICD-10-CM | POA: Diagnosis not present

## 2016-04-25 DIAGNOSIS — M1711 Unilateral primary osteoarthritis, right knee: Secondary | ICD-10-CM | POA: Diagnosis not present

## 2016-04-25 DIAGNOSIS — E119 Type 2 diabetes mellitus without complications: Secondary | ICD-10-CM | POA: Diagnosis not present

## 2016-04-27 ENCOUNTER — Other Ambulatory Visit (INDEPENDENT_AMBULATORY_CARE_PROVIDER_SITE_OTHER): Payer: Self-pay | Admitting: Orthopaedic Surgery

## 2016-04-30 ENCOUNTER — Ambulatory Visit (INDEPENDENT_AMBULATORY_CARE_PROVIDER_SITE_OTHER): Payer: Commercial Managed Care - HMO

## 2016-04-30 VITALS — BP 118/70 | HR 74 | Temp 98.7°F | Ht 62.5 in | Wt 228.5 lb

## 2016-04-30 DIAGNOSIS — Z0000 Encounter for general adult medical examination without abnormal findings: Secondary | ICD-10-CM

## 2016-04-30 NOTE — Patient Instructions (Signed)
Ms. Faraci , Thank you for taking time to come for your Medicare Wellness Visit. I appreciate your ongoing commitment to your health goals. Please review the following plan we discussed and let me know if I can assist you in the future.   These are the goals we discussed: Goals    . Increase physical activity          Starting 04/30/2016, I will continue to exercise at least 50-60 min 6 days per week.        This is a list of the screening recommended for you and due dates:  Health Maintenance  Topic Date Due  . Complete foot exam   05/07/2016*  . DEXA scan (bone density measurement)  04/30/2017*  . Shingles Vaccine  04/30/2017*  .  Hepatitis C: One time screening is recommended by Center for Disease Control  (CDC) for  adults born from [birth year] through 1967.   04/30/2017*  . Colon Cancer Screening  04/30/2026*  . Mammogram  09/19/2016  . Hemoglobin A1C  09/24/2016  . Eye exam for diabetics  11/22/2016  . Pneumonia vaccines (2 of 2 - PPSV23) 07/08/2017  . Tetanus Vaccine  03/30/2019  . Flu Shot  Completed  *Topic was postponed. The date shown is not the original due date.   Preventive Care for Adults  A healthy lifestyle and preventive care can promote health and wellness. Preventive health guidelines for adults include the following key practices.  . A routine yearly physical is a good way to check with your health care provider about your health and preventive screening. It is a chance to share any concerns and updates on your health and to receive a thorough exam.  . Visit your dentist for a routine exam and preventive care every 6 months. Brush your teeth twice a day and floss once a day. Good oral hygiene prevents tooth decay and gum disease.  . The frequency of eye exams is based on your age, health, family medical history, use  of contact lenses, and other factors. Follow your health care provider's ecommendations for frequency of eye exams.  . Eat a healthy diet. Foods  like vegetables, fruits, whole grains, low-fat dairy products, and lean protein foods contain the nutrients you need without too many calories. Decrease your intake of foods high in solid fats, added sugars, and salt. Eat the right amount of calories for you. Get information about a proper diet from your health care provider, if necessary.  . Regular physical exercise is one of the most important things you can do for your health. Most adults should get at least 150 minutes of moderate-intensity exercise (any activity that increases your heart rate and causes you to sweat) each week. In addition, most adults need muscle-strengthening exercises on 2 or more days a week.  Silver Sneakers may be a benefit available to you. To determine eligibility, you may visit the website: www.silversneakers.com or contact program at 571-639-6835 Mon-Fri between 8AM-8PM.   . Maintain a healthy weight. The body mass index (BMI) is a screening tool to identify possible weight problems. It provides an estimate of body fat based on height and weight. Your health care provider can find your BMI and can help you achieve or maintain a healthy weight.   For adults 20 years and older: ? A BMI below 18.5 is considered underweight. ? A BMI of 18.5 to 24.9 is normal. ? A BMI of 25 to 29.9 is considered overweight. ? A BMI of 30  and above is considered obese.   . Maintain normal blood lipids and cholesterol levels by exercising and minimizing your intake of saturated fat. Eat a balanced diet with plenty of fruit and vegetables. Blood tests for lipids and cholesterol should begin at age 69 and be repeated every 5 years. If your lipid or cholesterol levels are high, you are over 50, or you are at high risk for heart disease, you may need your cholesterol levels checked more frequently. Ongoing high lipid and cholesterol levels should be treated with medicines if diet and exercise are not working.  . If you smoke, find out from  your health care provider how to quit. If you do not use tobacco, please do not start.  . If you choose to drink alcohol, please do not consume more than 2 drinks per day. One drink is considered to be 12 ounces (355 mL) of beer, 5 ounces (148 mL) of wine, or 1.5 ounces (44 mL) of liquor.  . If you are 42-64 years old, ask your health care provider if you should take aspirin to prevent strokes.  . Use sunscreen. Apply sunscreen liberally and repeatedly throughout the day. You should seek shade when your shadow is shorter than you. Protect yourself by wearing long sleeves, pants, a wide-brimmed hat, and sunglasses year round, whenever you are outdoors.  . Once a month, do a whole body skin exam, using a mirror to look at the skin on your back. Tell your health care provider of new moles, moles that have irregular borders, moles that are larger than a pencil eraser, or moles that have changed in shape or color.

## 2016-04-30 NOTE — Progress Notes (Signed)
Subjective:   Carrie [name] is a 65 y.o. female who presents for Medicare Annual (Subsequent) preventive examination.  Review of Systems:  N/A Cardiac Risk Factors include: advanced age (>28men, >7 women);obesity (BMI >30kg/m2);diabetes mellitus;hypertension;dyslipidemia     Objective:     Vitals: BP 118/70 (BP Location: Right Arm, Patient Position: Sitting, Cuff Size: Normal)   Pulse 74   Temp 98.7 F (37.1 C) (Oral)   Ht 5' 2.5" (1.588 m) Comment: no shoes  Wt 228 lb 8 oz (103.6 kg)   SpO2 98%   BMI 41.13 kg/m   Body mass index is 41.13 kg/m.   Tobacco History  Smoking Status  . Never Smoker  Smokeless Tobacco  . Never Used     Counseling given: No   Past Medical History:  Diagnosis Date  . Allergy   . Arthritis    "right knee" (10/26'/2017)  . CKD (chronic kidney disease), stage III    12/2015 (Dr. Lavonia Dana)  . Depression   . History of blood transfusion 2008   "when I had brain tumor surgery"  . History of MRSA infection 2008  . Hypertension   . Meningioma ([place])    Foramen magnum  . Type II diabetes mellitus ([place]) 2011   Past Surgical History:  Procedure Laterality Date  . BACK SURGERY    . BRAIN MENINGIOMA EXCISION  08/2006   Foramina magnum meningioma  . CERVICAL DISCECTOMY  1996   Dr. Alric Seton  . CESAREAN SECTION  1981  . COLONOSCOPY    . DILATION AND CURETTAGE OF UTERUS    . JOINT REPLACEMENT    . Right wrist x-ray  2007   Deg. at 1st MCP  . TOTAL HIP ARTHROPLASTY Left 03/27/2016   Procedure: LEFT TOTAL HIP ARTHROPLASTY ANTERIOR APPROACH;  Surgeon: Mcarthur Rossetti, MD;  Location: [place];  Service: Orthopedics;  Laterality: Left;  . TUBAL LIGATION  1981   Family History  Problem Relation Age of Onset  . Hypertension Other     Strong family history    History  Sexual Activity  . Sexual activity: No    Outpatient Encounter Prescriptions as of 04/30/2016  Medication Sig  . acetaminophen (TYLENOL) 500 MG tablet Take  1,000 mg by mouth every 6 (six) hours as needed for mild pain.  [name] amLODipine (NORVASC) 10 MG tablet TAKE 1 TABLET EVERY DAY  . atorvastatin (LIPITOR) 40 MG tablet TAKE ONE TABLET BY MOUTH ONE TIME DAILY  . furosemide (LASIX) 40 MG tablet Take 40 mg by mouth daily.   [name] levETIRAcetam (KEPPRA) 1000 MG tablet TAKE ONE TABLET BY MOUTH TWICE DAILY  . LORazepam (ATIVAN) 0.5 MG tablet TAKE 1-2 TABLET BY MOUTH 3 TIMES A DAY AS NEEDED FOR NERVES (Patient taking differently: Take 0.5 mg by mouth every 8 (eight) hours as needed for anxiety. )  . losartan-hydrochlorothiazide (HYZAAR) 50-12.5 MG tablet TAKE ONE TABLET BY MOUTH ONE TIME DAILY  . metFORMIN (GLUCOPHAGE) 500 MG tablet TAKE 1 TABLET (500 MG TOTAL) BY MOUTH 2 (TWO) TIMES DAILY WITH A MEAL.  . methocarbamol (ROBAXIN) 500 MG tablet TAKE 1 TABLET BY MOUTH EVERY 6HRS AS NEEDED FOR MUSCLE SPAMS  . oxyCODONE-acetaminophen (ROXICET) 5-325 MG tablet Take 1-2 tablets by mouth every 4 (four) hours as needed.  . rivaroxaban (XARELTO) 10 MG TABS tablet Take 1 tablet (10 mg total) by mouth daily with breakfast.  . traMADol (ULTRAM) 50 MG tablet TAKE 1/2 TO 1 TABLET TWICE A DAY AS NEEDE  .  Vitamin D, Ergocalciferol, (DRISDOL) 50000 units CAPS capsule TAKE 1 CAPSULE BY MOUTH ONCE A MONTH   No facility-administered encounter medications on file as of 04/30/2016.     Activities of Daily Living In your present state of health, do you have any difficulty performing the following activities: 04/30/2016 03/29/2016  Hearing? N -  Vision? N -  Difficulty concentrating or making decisions? N -  Walking or climbing stairs? Y -  Dressing or bathing? N -  Doing errands, shopping? N Y  Conservation officer, nature and eating ? N -  Using the Toilet? N -  In the past six months, have you accidently leaked urine? N -  Do you have problems with loss of bowel control? N -  Managing your Medications? N -  Managing your Finances? N -  Housekeeping or managing your Housekeeping? N -    Some recent data might be hidden    Patient Care Team: Venia Carbon, MD as PCP - General    Assessment:     Hearing Screening   125Hz  250Hz  500Hz  1000Hz  2000Hz  3000Hz  4000Hz  6000Hz  8000Hz   Right ear:   40 40 40  40    Left ear:   0 0 0  0    Comments: Pt had cerumen visible in left ear.   Vision Screening Comments: Last vision exam in June 2017 with Dr. Gwynn Burly   Exercise Activities and Dietary recommendations Current Exercise Habits: Home exercise routine, Type of exercise: Other - see comments (PT exercises), Time (Minutes): 50, Frequency (Times/Week): 6, Weekly Exercise (Minutes/Week): 300, Intensity: Mild, Exercise limited by: orthopedic condition(s)  Goals    . Increase physical activity          Starting 04/30/2016, I will continue to exercise at least 50-60 min 6 days per week.       Fall Risk Fall Risk  04/30/2016 04/01/2015 04/01/2015 02/03/2014  Falls in the past year? No No No No   Depression Screen PHQ 2/9 Scores 04/30/2016 04/01/2015 04/01/2015 02/03/2014  PHQ - 2 Score 0 1 0 1     Cognitive Function MMSE - Mini Mental State Exam 04/30/2016  Orientation to time 5  Orientation to Place 5  Registration 3  Attention/ Calculation 0  Recall 3  Language- name 2 objects 0  Language- repeat 1  Language- follow 3 step command 3  Language- read & follow direction 0  Write a sentence 0  Copy design 0  Total score 20     PLEASE NOTE: A Mini-Cog screen was completed. Maximum score is 20. A value of 0 denotes this part of [place]stein MMSE was not completed or the patient failed this part of the Mini-Cog screening.   Mini-Cog Screening Orientation to Time - Max 5 pts Orientation to Place - Max 5 pts Registration - Max 3 pts Recall - Max 3 pts Language Repeat - Max 1 pts Language Follow 3 Step Command - Max 3 pts     Immunization History  Administered Date(s) Administered  . Influenza Split 03/09/2011, 03/05/2012  . Influenza Whole 04/11/2007,  02/27/2008, 03/07/2009, 02/22/2010  . Influenza,inj,Quad PF,36+ Mos 02/03/2014, 04/01/2015, 02/16/2016  . Pneumococcal Conjugate-13 04/01/2015  . Pneumococcal Polysaccharide-23 07/08/2012  . Td 03/31/1996, 03/29/2009   Screening Tests Health Maintenance  Topic Date Due  . FOOT EXAM  05/07/2016 (Originally 03/31/2016)  . DEXA SCAN  04/30/2017 (Originally 03/12/2014)  . ZOSTAVAX  04/30/2017 (Originally 03/12/2009)  . Hepatitis C Screening  04/30/2017 (Originally 1950/08/08)  . COLONOSCOPY  04/30/2026 (  Originally 03/13/1999)  . MAMMOGRAM  09/19/2016  . HEMOGLOBIN A1C  09/24/2016  . OPHTHALMOLOGY EXAM  11/22/2016  . PNA vac Low Risk Adult (2 of 2 - PPSV23) 07/08/2017  . TETANUS/TDAP  03/30/2019  . INFLUENZA VACCINE  Completed      Plan:     I have personally reviewed and addressed the Medicare Annual Wellness questionnaire and have noted the following in the patient's chart:  A. Medical and social history B. Use of alcohol, tobacco or illicit drugs  C. Current medications and supplements D. Functional ability and status E.  Nutritional status F.  Physical activity G. Advance directives H. List of other physicians I.  Hospitalizations, surgeries, and ER visits in previous 12 months J.  [place] to include hearing, vision, cognitive, depression L. Referrals and appointments - none  In addition, I have reviewed and discussed with patient certain preventive protocols, quality metrics, and best practice recommendations. A written personalized care plan for preventive services as well as general preventive health recommendations were provided to patient.  See attached scanned questionnaire for additional information.   Signed,   Lindell Noe, MHA, BS, LPN Health Coach

## 2016-04-30 NOTE — Telephone Encounter (Signed)
Please advise 

## 2016-04-30 NOTE — Progress Notes (Signed)
Pre visit review using our clinic review tool, if applicable. No additional management support is needed unless otherwise documented below in the visit note. 

## 2016-04-30 NOTE — Progress Notes (Signed)
PCP notes:   Health maintenance:  Colonoscopy- pt has to reschedule appt due to hip surgery Hep C screening - pt educated and agreed to complete with future labs Bone density - pt educated and advised to discuss with PCP at next appt Foot exam - if necessary, PCP will complete at next appt  Abnormal screenings:   Hearing - failed (Note: visible cerumen present on tip of audiometer when left ear assessed)  Patient concerns:   Pt is requesting refill for Ativan. Pt encouraged to share request with PCP at next appt.  Nurse concerns:  None  Next PCP appt:   05/07/16 @ 1030

## 2016-04-30 NOTE — Progress Notes (Signed)
   Subjective:    Patient ID: Carrie [name], female    DOB: 18-Jun-1950, 65 y.o.   MRN: [id number]  HPI  I reviewed health advisor's note, was available for consultation, and agree with documentation and plan.   Review of Systems     Objective:   Physical Exam        Assessment & Plan:

## 2016-05-01 DIAGNOSIS — E1122 Type 2 diabetes mellitus with diabetic chronic kidney disease: Secondary | ICD-10-CM | POA: Diagnosis not present

## 2016-05-01 DIAGNOSIS — R809 Proteinuria, unspecified: Secondary | ICD-10-CM | POA: Diagnosis not present

## 2016-05-01 DIAGNOSIS — N183 Chronic kidney disease, stage 3 (moderate): Secondary | ICD-10-CM | POA: Diagnosis not present

## 2016-05-01 DIAGNOSIS — I129 Hypertensive chronic kidney disease with stage 1 through stage 4 chronic kidney disease, or unspecified chronic kidney disease: Secondary | ICD-10-CM | POA: Diagnosis not present

## 2016-05-01 DIAGNOSIS — E559 Vitamin D deficiency, unspecified: Secondary | ICD-10-CM | POA: Diagnosis not present

## 2016-05-07 ENCOUNTER — Ambulatory Visit (INDEPENDENT_AMBULATORY_CARE_PROVIDER_SITE_OTHER): Payer: Commercial Managed Care - HMO | Admitting: Internal Medicine

## 2016-05-07 ENCOUNTER — Encounter: Payer: Self-pay | Admitting: Internal Medicine

## 2016-05-07 VITALS — BP 124/80 | HR 100 | Temp 98.0°F | Ht 62.5 in | Wt 226.0 lb

## 2016-05-07 DIAGNOSIS — E1121 Type 2 diabetes mellitus with diabetic nephropathy: Secondary | ICD-10-CM

## 2016-05-07 DIAGNOSIS — F39 Unspecified mood [affective] disorder: Secondary | ICD-10-CM

## 2016-05-07 DIAGNOSIS — N184 Chronic kidney disease, stage 4 (severe): Secondary | ICD-10-CM | POA: Insufficient documentation

## 2016-05-07 DIAGNOSIS — N183 Chronic kidney disease, stage 3 unspecified: Secondary | ICD-10-CM

## 2016-05-07 DIAGNOSIS — Z780 Asymptomatic menopausal state: Secondary | ICD-10-CM

## 2016-05-07 DIAGNOSIS — Z6841 Body Mass Index (BMI) 40.0 and over, adult: Secondary | ICD-10-CM

## 2016-05-07 DIAGNOSIS — D320 Benign neoplasm of cerebral meninges: Secondary | ICD-10-CM

## 2016-05-07 DIAGNOSIS — I10 Essential (primary) hypertension: Secondary | ICD-10-CM

## 2016-05-07 LAB — CBC WITH DIFFERENTIAL/PLATELET
BASOS ABS: 0 10*3/uL (ref 0.0–0.1)
Basophils Relative: 0.4 % (ref 0.0–3.0)
EOS ABS: 0.1 10*3/uL (ref 0.0–0.7)
Eosinophils Relative: 1.9 % (ref 0.0–5.0)
HEMATOCRIT: 30 % — AB (ref 36.0–46.0)
HEMOGLOBIN: 10 g/dL — AB (ref 12.0–15.0)
LYMPHS PCT: 36.9 % (ref 12.0–46.0)
Lymphs Abs: 2.5 10*3/uL (ref 0.7–4.0)
MCHC: 33.4 g/dL (ref 30.0–36.0)
MCV: 82 fl (ref 78.0–100.0)
Monocytes Absolute: 0.4 10*3/uL (ref 0.1–1.0)
Monocytes Relative: 5.3 % (ref 3.0–12.0)
NEUTROS ABS: 3.8 10*3/uL (ref 1.4–7.7)
Neutrophils Relative %: 55.5 % (ref 43.0–77.0)
PLATELETS: 410 10*3/uL — AB (ref 150.0–400.0)
RBC: 3.66 Mil/uL — ABNORMAL LOW (ref 3.87–5.11)
RDW: 16.3 % — ABNORMAL HIGH (ref 11.5–15.5)
WBC: 6.9 10*3/uL (ref 4.0–10.5)

## 2016-05-07 LAB — RENAL FUNCTION PANEL
Albumin: 4.3 g/dL (ref 3.5–5.2)
BUN: 28 mg/dL — AB (ref 6–23)
CO2: 25 mEq/L (ref 19–32)
CREATININE: 1.73 mg/dL — AB (ref 0.40–1.20)
Calcium: 9.8 mg/dL (ref 8.4–10.5)
Chloride: 106 mEq/L (ref 96–112)
GFR: 37.77 mL/min — ABNORMAL LOW (ref >60.00)
Glucose, Bld: 109 mg/dL — ABNORMAL HIGH (ref 70–99)
PHOSPHORUS: 4.1 mg/dL (ref 2.3–4.6)
Potassium: 4.2 mEq/L (ref 3.5–5.1)
SODIUM: 141 meq/L (ref 135–145)

## 2016-05-07 LAB — HM DIABETES FOOT EXAM

## 2016-05-07 MED ORDER — METFORMIN HCL 500 MG PO TABS: 500.0000 mg | ORAL_TABLET | Freq: Every day | ORAL | 0 refills | Status: DC

## 2016-05-07 MED ORDER — VITAMIN D (ERGOCALCIFEROL) 1.25 MG (50000 UNIT) PO CAPS: ORAL_CAPSULE | ORAL | 3 refills | Status: DC

## 2016-05-07 MED ORDER — OXYCODONE-ACETAMINOPHEN 5-325 MG PO TABS: 1.0000 | ORAL_TABLET | Freq: Three times a day (TID) | ORAL | 0 refills | Status: DC | PRN

## 2016-05-07 NOTE — Assessment & Plan Note (Signed)
Hopefully still well controlled Will decrease metformin to 500mg  daily due to CKD (and if GFR under 45 and A1c still under 7%--can stop completely)

## 2016-05-07 NOTE — Assessment & Plan Note (Signed)
Mostly doing better Still lives in family home

## 2016-05-07 NOTE — Assessment & Plan Note (Signed)
BP Readings from Last 3 Encounters:  05/07/16 124/80  04/30/16 118/70  03/30/16 (!) 112/58   Is on ARB

## 2016-05-07 NOTE — Assessment & Plan Note (Signed)
Continues on keppra since surgery

## 2016-05-07 NOTE — Assessment & Plan Note (Signed)
Has cut back on her eating

## 2016-05-07 NOTE — Assessment & Plan Note (Signed)
Will recheck labs for Dr Juleen China

## 2016-05-07 NOTE — Progress Notes (Signed)
Subjective:    Patient ID: Carrie [name], female    DOB: December 12, 1950, 65 y.o.   MRN: [id number]  HPI Here for follow up of chronic health conditions Recent wellness visit  Still having some pain from the hip Out at church and traveling yesterday--feels it today Uses ice pack prn Now on cane only Always has someone with her Tries to walk around house and yard Still uses occasional oxycodone  Keeps up with Dr Carrie [name] Due for repeat labs GFR down in hospital---may need to review the metformin  Not checking sugars No leg numbness, sores or pain Recent eye exam  No headaches No focal weakness  Current Outpatient Prescriptions on File Prior to Visit  Medication Sig Dispense Refill  . acetaminophen (TYLENOL) 500 MG tablet Take 1,000 mg by mouth every 6 (six) hours as needed for mild pain.    [name] amLODipine (NORVASC) 10 MG tablet TAKE 1 TABLET EVERY DAY 90 tablet 0  . atorvastatin (LIPITOR) 40 MG tablet TAKE ONE TABLET BY MOUTH ONE TIME DAILY 90 tablet 3  . levETIRAcetam (KEPPRA) 1000 MG tablet TAKE ONE TABLET BY MOUTH TWICE DAILY 180 tablet 3  . LORazepam (ATIVAN) 0.5 MG tablet TAKE 1-2 TABLET BY MOUTH 3 TIMES A DAY AS NEEDED FOR NERVES (Patient taking differently: Take 0.5 mg by mouth every 8 (eight) hours as needed for anxiety. ) 60 tablet 0  . losartan-hydrochlorothiazide (HYZAAR) 50-12.5 MG tablet TAKE ONE TABLET BY MOUTH ONE TIME DAILY 90 tablet 0  . methocarbamol (ROBAXIN) 500 MG tablet TAKE 1 TABLET BY MOUTH EVERY 6HRS AS NEEDED FOR MUSCLE SPAMS 60 tablet 0   No current facility-administered medications on file prior to visit.     Allergies  Allergen Reactions  . Naproxen Sodium Anaphylaxis and Swelling  . Sulfamethoxazole-Trimethoprim Anaphylaxis and Swelling    Past Medical History:  Diagnosis Date  . Allergy   . Arthritis    "right knee" (10/26'/2017)  . CKD (chronic kidney disease), stage III    12/2015 (Dr. Lavonia [name])  . Depression   . History of blood  transfusion 2008   "when I had brain tumor surgery"  . History of MRSA infection 2008  . Hypertension   . Meningioma (Carrie [name])    Foramen magnum  . Type II diabetes mellitus (Carrie [name]) 2011    Past Surgical History:  Procedure Laterality Date  . BACK SURGERY    . BRAIN MENINGIOMA EXCISION  08/2006   Foramina magnum meningioma  . CERVICAL DISCECTOMY  1996   Dr. Alric [name]  . CESAREAN SECTION  1981  . COLONOSCOPY    . DILATION AND CURETTAGE OF UTERUS    . JOINT REPLACEMENT    . Right wrist x-ray  2007   Deg. at 1st MCP  . TOTAL HIP ARTHROPLASTY Left 03/27/2016   Procedure: LEFT TOTAL HIP ARTHROPLASTY ANTERIOR APPROACH;  Surgeon: Carrie Rossetti, MD;  Location: [facility];  Service: Orthopedics;  Laterality: Left;  . TUBAL LIGATION  1981    Family History  Problem Relation Age of Onset  . Hypertension Other     Strong family history     Social History   Social History  . Marital status: Widowed    Spouse name: N/A  . Number of children: 1  . Years of education: N/A   Occupational History  . 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 Disabled since brain surgery    Social History Main Topics  . Smoking status: Never Smoker  .  Smokeless tobacco: Never Used  . Alcohol use No  . Drug use: No  . Sexual activity: No   Other Topics Concern  . Not on file   Social History Narrative   Lives in family home with extended family.      No living will   Requests niece, Carrie [name], as health care POA   Would accept resuscitation attempts   No tube feeds if cognitively aware   Review of Systems Appetite is okay--but she has cut back Weight is stable Not sleeping well--but this is nothing new (bothered by frequent nocturia)    Objective:   Physical Exam  Constitutional: She appears well-developed and well-nourished. No distress.  Neck: Normal range of motion. Neck supple. No thyromegaly present.  Cardiovascular: Normal rate, regular rhythm, normal heart sounds and intact  distal pulses.  Exam reveals no gallop.   No murmur heard. Pulmonary/Chest: Effort normal and breath sounds normal. No respiratory distress. She has no wheezes. She has no rales.  Abdominal: Soft. There is no tenderness.  Musculoskeletal: She exhibits no edema.  Lymphadenopathy:    She has no cervical adenopathy.  Neurological:  Normal sensation in feet  Skin:  No foot lesions  Psychiatric: She has a normal mood and affect. Her behavior is normal.          Assessment & Plan:

## 2016-05-07 NOTE — Progress Notes (Signed)
Pre visit review using our clinic review tool, if applicable. No additional management support is needed unless otherwise documented below in the visit note. 

## 2016-05-07 NOTE — Patient Instructions (Addendum)
You are due for your screening mammogram next April. Please cut the metformin to just 1 daily

## 2016-05-08 ENCOUNTER — Ambulatory Visit (INDEPENDENT_AMBULATORY_CARE_PROVIDER_SITE_OTHER): Payer: Commercial Managed Care - HMO

## 2016-05-08 ENCOUNTER — Ambulatory Visit (INDEPENDENT_AMBULATORY_CARE_PROVIDER_SITE_OTHER): Payer: Commercial Managed Care - HMO | Admitting: Orthopaedic Surgery

## 2016-05-08 DIAGNOSIS — Z96642 Presence of left artificial hip joint: Secondary | ICD-10-CM

## 2016-05-08 NOTE — Progress Notes (Signed)
The patient is a 65 year old female who is now 6 weeks status post a left total hip arthroplasty. She says she is doing great. She is ambulating with a cane. She is driving already. She has really no complaints.  Examination of her left hip shows incisions well-healed. She tolerates me easily put in her left hip the range of motion her leg lengths are equal. X-rays of her pelvis and left hip show well-seated implant with no, getting features.  At this point

## 2016-05-27 ENCOUNTER — Other Ambulatory Visit: Payer: Self-pay | Admitting: Internal Medicine

## 2016-05-29 ENCOUNTER — Other Ambulatory Visit (INDEPENDENT_AMBULATORY_CARE_PROVIDER_SITE_OTHER): Payer: Self-pay | Admitting: Orthopaedic Surgery

## 2016-05-29 ENCOUNTER — Other Ambulatory Visit: Payer: Self-pay | Admitting: Internal Medicine

## 2016-05-29 NOTE — Telephone Encounter (Signed)
Approved: #60 x 0 

## 2016-05-29 NOTE — Telephone Encounter (Signed)
Left refill on voice mail at pharmacy  

## 2016-05-29 NOTE — Telephone Encounter (Signed)
Last filled 02-16-16 #60 Last OV 05-07-16 Next OV 11-05-16

## 2016-05-31 NOTE — Telephone Encounter (Signed)
Please advise 

## 2016-06-05 ENCOUNTER — Other Ambulatory Visit: Payer: Self-pay | Admitting: Internal Medicine

## 2016-06-05 DIAGNOSIS — Z1231 Encounter for screening mammogram for malignant neoplasm of breast: Secondary | ICD-10-CM

## 2016-06-06 ENCOUNTER — Ambulatory Visit (INDEPENDENT_AMBULATORY_CARE_PROVIDER_SITE_OTHER): Payer: Commercial Managed Care - HMO | Admitting: Physician Assistant

## 2016-06-06 DIAGNOSIS — Z96642 Presence of left artificial hip joint: Secondary | ICD-10-CM

## 2016-06-06 NOTE — Progress Notes (Signed)
Post-Op Visit Note   Patient: Carrie [name]           Date of Birth: [birth year]           MRN: [id number] Visit Date: 06/06/2016 PCP: Viviana Simpler, MD   Assessment : Status post left total hip arthroplasty 03/27/2016 doing well without complaints Chief Complaint:  Chief Complaint  Patient presents with  . Left Hip - Follow-up    Patient states she is doing wonderful. No complaints. Her ROM and strength are great  EXAM: Excellent range of motion left without pain. Ambulates without any assistive device. Surgical incision is well-healed no signs of infection. Calf supple nontender left foot sensation intact posterior tibial pulse intact. Visit Diagnoses:  1. Status post total replacement of left hip     Plan: Continue to work on range of motion strengthening of the hip. See her back in 1 year postop sooner if there is any questions or concerns. AP pelvis and lateral view of left hip at return at 1 year postop  Follow-Up Instructions: Return in about 9 months (around 03/06/2017) for post op.   Orders:  No orders of the defined types were placed in this encounter.  No orders of the defined types were placed in this encounter.    PMFS History: Patient Active Problem List   Diagnosis Date Noted  . Chronic kidney disease, stage III (moderate) 05/07/2016  . Unilateral primary osteoarthritis, left hip 03/27/2016  . Status post total replacement of left hip 03/27/2016  . Left hip pain 03/05/2016  . Mood disorder ([place]) 04/01/2015  . Dental infection 02/03/2014  . Osteoarthritis of right knee 02/03/2014  . Neck pain 02/03/2014  . Advanced directives, counseling/discussion 02/03/2014  . Routine general medical examination at a health care facility 07/08/2012  . BMI 40.0-44.9, adult ([place]) 07/08/2012  . Type 2 diabetes, controlled, with renal manifestation ([place])   . COMMON MIGRAINE 09/27/2006  . HYPERCHOLESTEROLEMIA 09/26/2006  . Essential hypertension, benign 09/26/2006  .  ALLERGIC RHINITIS 09/26/2006  . MENINGIOMA 08/03/2006   Past Medical History:  Diagnosis Date  . Allergy   . Arthritis    "right knee" (10/26'/2017)  . CKD (chronic kidney disease), stage III    12/2015 (Dr. Lavonia Dana)  . Depression   . History of blood transfusion 2008   "when I had brain tumor surgery"  . History of MRSA infection 2008  . Hypertension   . Meningioma ([place])    Foramen magnum  . Type II diabetes mellitus ([place]) 2011    Family History  Problem Relation Age of Onset  . Hypertension Other     Strong family history     Past Surgical History:  Procedure Laterality Date  . BACK SURGERY    . BRAIN MENINGIOMA EXCISION  08/2006   Foramina magnum meningioma  . CERVICAL DISCECTOMY  1996   Dr. Alric Seton  . CESAREAN SECTION  1981  . COLONOSCOPY    . DILATION AND CURETTAGE OF UTERUS    . JOINT REPLACEMENT    . Right wrist x-ray  2007   Deg. at 1st MCP  . TOTAL HIP ARTHROPLASTY Left 03/27/2016   Procedure: LEFT TOTAL HIP ARTHROPLASTY ANTERIOR APPROACH;  Surgeon: Mcarthur Rossetti, MD;  Location: [facility];  Service: Orthopedics;  Laterality: Left;  . TUBAL LIGATION  1981   Social History   Occupational History  . 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 Disabled since brain surgery    Social History Main Topics  .  Smoking status: Never Smoker  . Smokeless tobacco: Never Used  . Alcohol use No  . Drug use: No  . Sexual activity: No

## 2016-06-08 ENCOUNTER — Other Ambulatory Visit: Payer: Self-pay | Admitting: Internal Medicine

## 2016-06-27 ENCOUNTER — Other Ambulatory Visit: Payer: Self-pay | Admitting: Internal Medicine

## 2016-06-28 ENCOUNTER — Other Ambulatory Visit: Payer: Self-pay | Admitting: Internal Medicine

## 2016-07-11 ENCOUNTER — Ambulatory Visit
Admission: RE | Admit: 2016-07-11 | Discharge: 2016-07-11 | Disposition: A | Discharge status: Home / Self Care | Payer: Commercial Managed Care - HMO | Source: Ambulatory Visit | Attending: Internal Medicine | Admitting: Internal Medicine

## 2016-07-11 DIAGNOSIS — Z780 Asymptomatic menopausal state: Secondary | ICD-10-CM | POA: Diagnosis not present

## 2016-07-11 DIAGNOSIS — Z1231 Encounter for screening mammogram for malignant neoplasm of breast: Secondary | ICD-10-CM | POA: Diagnosis not present

## 2016-07-11 DIAGNOSIS — M85851 Other specified disorders of bone density and structure, right thigh: Secondary | ICD-10-CM | POA: Diagnosis not present

## 2016-07-25 ENCOUNTER — Ambulatory Visit (INDEPENDENT_AMBULATORY_CARE_PROVIDER_SITE_OTHER): Payer: Commercial Managed Care - HMO | Admitting: Internal Medicine

## 2016-07-25 ENCOUNTER — Encounter: Payer: Self-pay | Admitting: Internal Medicine

## 2016-07-25 VITALS — BP 120/82 | HR 73 | Temp 97.7°F | Wt 233.0 lb

## 2016-07-25 DIAGNOSIS — M5412 Radiculopathy, cervical region: Secondary | ICD-10-CM

## 2016-07-25 MED ORDER — VITAMIN D (ERGOCALCIFEROL) 1.25 MG (50000 UNIT) PO CAPS: ORAL_CAPSULE | ORAL | 3 refills | Status: DC

## 2016-07-25 MED ORDER — GABAPENTIN 100 MG PO CAPS: 100.0000 mg | ORAL_CAPSULE | Freq: Three times a day (TID) | ORAL | 3 refills | Status: DC

## 2016-07-25 NOTE — Progress Notes (Signed)
Subjective:    Patient ID: Carrie [name], female    DOB: [birth year], 66 y.o.   MRN: [id number]  HPI Here with problems with left shoulder Very bad pain--starts at shoulder blade and goes down to fingers All fingers will get numb at times  No hand or arm weakness Some tingling Shoulder ROM is okay---- does all her activities  No neck problems  Current Outpatient Prescriptions on File Prior to Visit  Medication Sig Dispense Refill  . acetaminophen (TYLENOL) 500 MG tablet Take 1,000 mg by mouth every 6 (six) hours as needed for mild pain.    [name] amLODipine (NORVASC) 10 MG tablet TAKE 1 TABLET EVERY DAY 90 tablet 3  . atorvastatin (LIPITOR) 40 MG tablet TAKE ONE TABLET BY MOUTH ONE TIME DAILY 90 tablet 3  . levETIRAcetam (KEPPRA) 1000 MG tablet TAKE ONE TABLET BY MOUTH TWICE DAILY 180 tablet 3  . LORazepam (ATIVAN) 0.5 MG tablet TAKE 1 TO 2 TABLETS BY MOUTH 3 TIMES A DAY AS NEEDED 60 tablet 0  . losartan-hydrochlorothiazide (HYZAAR) 50-12.5 MG tablet TAKE ONE TABLET BY MOUTH ONE TIME DAILY 90 tablet 3  . methocarbamol (ROBAXIN) 500 MG tablet TAKE 1 TABLET BY MOUTH EVERY 6HRS AS NEEDED FOR MUSCLE SPAMS 60 tablet 0  . Vitamin D, Ergocalciferol, (DRISDOL) 50000 units CAPS capsule TAKE 1 CAPSULE BY MOUTH ONCE A MONTH 3 capsule 3   No current facility-administered medications on file prior to visit.     Allergies  Allergen Reactions  . Naproxen Sodium Anaphylaxis and Swelling  . Sulfamethoxazole-Trimethoprim Anaphylaxis and Swelling    Past Medical History:  Diagnosis Date  . Allergy   . Arthritis    "right knee" (10/26'/2017)  . CKD (chronic kidney disease), stage III    12/2015 (Dr. Lavonia Dana)  . Depression   . History of blood transfusion 2008   "when I had brain tumor surgery"  . History of MRSA infection 2008  . Hypertension   . Meningioma ([place])    Foramen magnum  . Type II diabetes mellitus ([place]) 2011    Past Surgical History:  Procedure Laterality Date  .  BACK SURGERY    . BRAIN MENINGIOMA EXCISION  08/2006   Foramina magnum meningioma  . CERVICAL DISCECTOMY  1996   Dr. Alric Seton  . CESAREAN SECTION  1981  . COLONOSCOPY    . DILATION AND CURETTAGE OF UTERUS    . JOINT REPLACEMENT    . Right wrist x-ray  2007   Deg. at 1st MCP  . TOTAL HIP ARTHROPLASTY Left 03/27/2016   Procedure: LEFT TOTAL HIP ARTHROPLASTY ANTERIOR APPROACH;  Surgeon: Mcarthur Rossetti, MD;  Location: [facility];  Service: Orthopedics;  Laterality: Left;  . TUBAL LIGATION  1981    Family History  Problem Relation Age of Onset  . Hypertension Other     Strong family history   . Breast cancer Mother 24    Social History   Social History  . Marital status: Widowed    Spouse name: N/A  . Number of children: 1  . Years of education: N/A   Occupational History  . 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 Disabled since brain surgery    Social History Main Topics  . Smoking status: Never Smoker  . Smokeless tobacco: Never Used  . Alcohol use No  . Drug use: No  . Sexual activity: No   Other Topics Concern  . Not on file   Social History Narrative  Lives in family home with extended family.      No living will   Requests niece, [name], as health care POA   Would accept resuscitation attempts   No tube feeds if cognitively aware   Review of Systems Has noted some allergy symptoms Clear mucus in AM Occasional blood in mucus Zyrtec not helpful---used flonase in past    Objective:   Physical Exam  Neck:  Stiff and decreased ROM but no tenderness  Musculoskeletal:  Good ROM in left shoulder though some clunks No tenderness  Neurological:  Normal strength in left hand and arm          Assessment & Plan:

## 2016-07-25 NOTE — Assessment & Plan Note (Signed)
Pain doesn't seem to be related to shoulder No weakness so I wouldn't pursue surgery--will hold off on MRI Trial gabapentin and titrate as needed

## 2016-07-25 NOTE — Progress Notes (Signed)
Pre visit review using our clinic review tool, if applicable. No additional management support is needed unless otherwise documented below in the visit note. 

## 2016-07-25 NOTE — Patient Instructions (Signed)
Please start the gabapentin with 100mg  at bedtime. Then add a morning dose, and finally an afternoon dose after a few days. If it makes you sleepy, try them all at bedtime. If it helps, but not enough, contact me and we can increase the dose.

## 2016-08-02 ENCOUNTER — Telehealth: Payer: Self-pay

## 2016-08-02 MED ORDER — TIZANIDINE HCL 2 MG PO TABS: 2.0000 mg | ORAL_TABLET | Freq: Two times a day (BID) | ORAL | 0 refills | Status: DC | PRN

## 2016-08-02 NOTE — Telephone Encounter (Signed)
Pt left v/m; pt seen 07/25/16 with pinched nerve on lt arm; pt request muscle relaxant to go with the gabapentin; pt is not sleeping due to having a lot of pain and pt request cb. [place]

## 2016-08-02 NOTE — Telephone Encounter (Signed)
Please let her know that I sent a prescription. She should be careful as it can cause confusion and sedation in some people.

## 2016-08-02 NOTE — Telephone Encounter (Signed)
Spoke to pt and advised per Dr Silvio Pate; pt states she has already picked up Rx

## 2016-08-06 DIAGNOSIS — E1122 Type 2 diabetes mellitus with diabetic chronic kidney disease: Secondary | ICD-10-CM | POA: Diagnosis not present

## 2016-08-06 DIAGNOSIS — E559 Vitamin D deficiency, unspecified: Secondary | ICD-10-CM | POA: Diagnosis not present

## 2016-08-06 DIAGNOSIS — R809 Proteinuria, unspecified: Secondary | ICD-10-CM | POA: Diagnosis not present

## 2016-08-06 DIAGNOSIS — D631 Anemia in chronic kidney disease: Secondary | ICD-10-CM | POA: Diagnosis not present

## 2016-08-06 DIAGNOSIS — I129 Hypertensive chronic kidney disease with stage 1 through stage 4 chronic kidney disease, or unspecified chronic kidney disease: Secondary | ICD-10-CM | POA: Diagnosis not present

## 2016-08-06 DIAGNOSIS — N183 Chronic kidney disease, stage 3 (moderate): Secondary | ICD-10-CM | POA: Diagnosis not present

## 2016-08-14 ENCOUNTER — Other Ambulatory Visit: Payer: Self-pay | Admitting: Internal Medicine

## 2016-08-14 NOTE — Telephone Encounter (Signed)
Last Rx 08/02/2016 #30. Last OV 07/2016

## 2016-08-14 NOTE — Telephone Encounter (Signed)
Approved: #30 x 1

## 2016-09-03 ENCOUNTER — Other Ambulatory Visit: Payer: Self-pay | Admitting: Internal Medicine

## 2016-09-03 NOTE — Telephone Encounter (Signed)
LAst filled 05-29-16 #60 Last OV  07-25-16 Next OV 11-05-16

## 2016-09-04 NOTE — Telephone Encounter (Signed)
Approved: #60 x 0 

## 2016-09-04 NOTE — Telephone Encounter (Signed)
Left refill on voice mail at pharmacy  

## 2016-09-10 ENCOUNTER — Ambulatory Visit (INDEPENDENT_AMBULATORY_CARE_PROVIDER_SITE_OTHER): Payer: Medicare HMO | Admitting: Family Medicine

## 2016-09-10 ENCOUNTER — Encounter: Payer: Self-pay | Admitting: Family Medicine

## 2016-09-10 VITALS — BP 140/80 | HR 90 | Temp 97.8°F | Ht 62.5 in | Wt 243.5 lb

## 2016-09-10 DIAGNOSIS — M1711 Unilateral primary osteoarthritis, right knee: Secondary | ICD-10-CM | POA: Diagnosis not present

## 2016-09-10 MED ORDER — METHYLPREDNISOLONE ACETATE 40 MG/ML IJ SUSP
80.0000 mg | Freq: Once | INTRAMUSCULAR | Status: AC
  Administered 2016-09-10: 80 mg via INTRA_ARTICULAR

## 2016-09-10 NOTE — Progress Notes (Signed)
Dr. Frederico Hamman T. Yesika Rispoli, MD, [facility] [place], [address] Phone: [phone] Fax: [phone]  09/10/2016  Patient: Carrie [name], MRN: [id number], DOB: 09/10/1950, 66 y.o.  Primary Physician:  Viviana Simpler, MD   Chief Complaint  Patient presents with  . Knee Pain    Right   Subjective:   Carrie [name] is a 66 y.o. very pleasant female patient who presents with the following:  R knee pain:  9 months ago last knee injection.   Pleasant patient with right-sided knee pain and has a known osteoarthritis. She had a corticosteroid injection 9 months ago with good relief of symptoms until now. She has had left-sided total hip arthroplasty.  Body mass index is 43.83 kg/m.   Past Medical History, Surgical History, Social History, Family History, Problem List, Medications, and Allergies have been reviewed and updated if relevant.  Patient Active Problem List   Diagnosis Date Noted  . Cervical radiculopathy 07/25/2016  . Chronic kidney disease, stage III (moderate) 05/07/2016  . Unilateral primary osteoarthritis, left hip 03/27/2016  . Status post total replacement of left hip 03/27/2016  . Left hip pain 03/05/2016  . Mood disorder ([place]) 04/01/2015  . Dental infection 02/03/2014  . Osteoarthritis of right knee 02/03/2014  . Neck pain 02/03/2014  . Advanced directives, counseling/discussion 02/03/2014  . Routine general medical examination at a health care facility 07/08/2012  . BMI 40.0-44.9, adult ([place]) 07/08/2012  . Type 2 diabetes, controlled, with renal manifestation ([place])   . COMMON MIGRAINE 09/27/2006  . HYPERCHOLESTEROLEMIA 09/26/2006  . Essential hypertension, benign 09/26/2006  . ALLERGIC RHINITIS 09/26/2006  . MENINGIOMA 08/03/2006    Past Medical History:  Diagnosis Date  . Allergy   . Arthritis    "right knee" (10/26'/2017)  . CKD (chronic kidney disease), stage III    12/2015 (Dr. Lavonia Dana)  . Depression   . History of blood transfusion 2008   "when I had brain tumor surgery"  . History of MRSA infection 2008  . Hypertension   . Meningioma ([place])    Foramen magnum  . Type II diabetes mellitus ([place]) 2011    Past Surgical History:  Procedure Laterality Date  . BACK SURGERY    . BRAIN MENINGIOMA EXCISION  08/2006   Foramina magnum meningioma  . CERVICAL DISCECTOMY  1996   Dr. Alric Seton  . CESAREAN SECTION  1981  . COLONOSCOPY    . DILATION AND CURETTAGE OF UTERUS    . JOINT REPLACEMENT    . Right wrist x-ray  2007   Deg. at 1st MCP  . TOTAL HIP ARTHROPLASTY Left 03/27/2016   Procedure: LEFT TOTAL HIP ARTHROPLASTY ANTERIOR APPROACH;  Surgeon: Mcarthur Rossetti, MD;  Location: [facility];  Service: Orthopedics;  Laterality: Left;  . TUBAL LIGATION  1981    Social History   Social History  . Marital status: Widowed    Spouse name: N/A  . Number of children: 1  . Years of education: N/A   Occupational History  . 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 Disabled since brain surgery    Social History Main Topics  . Smoking status: Never Smoker  . Smokeless tobacco: Never Used  . Alcohol use No  . Drug use: No  . Sexual activity: No   Other Topics Concern  . Not on file   Social History Narrative   Lives in family home with extended family.  No living will   Requests niece, [name], as health care POA   Would accept resuscitation attempts   No tube feeds if cognitively aware    Family History  Problem Relation Age of Onset  . Hypertension Other     Strong family history   . Breast cancer Mother 27    Allergies  Allergen Reactions  . Naproxen Sodium Anaphylaxis and Swelling  . Sulfamethoxazole-Trimethoprim Anaphylaxis and Swelling    Medication list reviewed and updated in full in [facility].  GEN: No fevers, chills. Nontoxic. Primarily MSK c/o today. MSK: Detailed in the HPI GI: tolerating PO intake without  difficulty Neuro: No numbness, parasthesias, or tingling associated. Otherwise the pertinent positives of the ROS are noted above.   Objective:   BP 140/80   Pulse 90   Temp 97.8 F (36.6 C) (Oral)   Ht 5' 2.5" (1.588 m)   Wt 243 lb 8 oz (110.5 kg)   BMI 43.83 kg/m    GEN: WDWN, NAD, Non-toxic, Alert & Oriented x 3 HEENT: Atraumatic, Normocephalic.  Ears and Nose: No external deformity. EXTR: No clubbing/cyanosis/edema NEURO: Normal gait.  PSYCH: Normally interactive. Conversant. Not depressed or anxious appearing.  Calm demeanor.   Knee:  R Gait: Normal heel toe pattern ROM: 0-115 Effusion: neg Echymosis or edema: none Patellar tendon NT Painful PLICA: neg Patellar grind: negative Medial and lateral patellar facet loading: mild medial and lateral joint lines: medial > lateral joint line pain Mcmurray's pain Flexion-pinch pain Varus and valgus stress: stable Lachman: neg Ant and Post drawer: neg Hip abduction, IR, ER: WNL Hip flexion str: 5/5 Hip abd: 5/5 Quad: 5/5 VMO atrophy:No Hamstring concentric and eccentric: 5/5   Radiology: No results found.  Assessment and Plan:   Primary osteoarthritis of right knee - Plan: methylPREDNISolone acetate (DEPO-MEDROL) injection 80 mg  Osteoarthritis of the knee with exacerbation. Continue weight loss, conservative care.  Knee Injection, R Patient verbally consented to procedure. Risks (including potential rare risk of infection), benefits, and alternatives explained. Sterilely prepped with Chloraprep. Ethyl cholride used for anesthesia. 8 cc Lidocaine 1% mixed with 2 mL Depo-Medrol 40 mg injected using the anteromedial approach without difficulty. No complications with procedure and tolerated well. Patient had decreased pain post-injection.   Follow-up: No Follow-up on file.  Meds ordered this encounter  Medications  . losartan (COZAAR) 50 MG tablet    Sig: Take 50 mg by mouth daily.    Refill:  5  .  methylPREDNISolone acetate (DEPO-MEDROL) injection 80 mg   Medications Discontinued During This Encounter  Medication Reason  . losartan-hydrochlorothiazide (HYZAAR) 50-12.5 MG tablet Change in therapy    Signed,  Crockett Rallo T. Raydell Maners, MD   Allergies as of 09/10/2016      Reactions   Naproxen Sodium Anaphylaxis, Swelling   Sulfamethoxazole-trimethoprim Anaphylaxis, Swelling      Medication List       Accurate as of 09/10/16  2:39 PM. Always use your most recent med list.          acetaminophen 500 MG tablet Commonly known as:  TYLENOL Take 1,000 mg by mouth every 6 (six) hours as needed for mild pain.   amLODipine 10 MG tablet Commonly known as:  NORVASC TAKE 1 TABLET EVERY DAY   atorvastatin 40 MG tablet Commonly known as:  LIPITOR TAKE ONE TABLET BY MOUTH ONE TIME DAILY   gabapentin 100 MG capsule Commonly known as:  NEURONTIN Take 1 capsule (100 mg total) by mouth 3 (  three) times daily.   levETIRAcetam 1000 MG tablet Commonly known as:  KEPPRA TAKE ONE TABLET BY MOUTH TWICE DAILY   LORazepam 0.5 MG tablet Commonly known as:  ATIVAN TAKE 1/2-1 TABLET BY MOUTH 3 TIMES A DAY AS NEEDED   losartan 50 MG tablet Commonly known as:  COZAAR Take 50 mg by mouth daily.   methocarbamol 500 MG tablet Commonly known as:  ROBAXIN TAKE 1 TABLET BY MOUTH EVERY 6HRS AS NEEDED FOR MUSCLE SPAMS   tiZANidine 2 MG tablet Commonly known as:  ZANAFLEX TAKE 1 TABLET (2 MG TOTAL) BY MOUTH 2 (TWO) TIMES DAILY AS NEEDED FOR MUSCLE SPASMS.   Vitamin D (Ergocalciferol) 50000 units Caps capsule Commonly known as:  DRISDOL TAKE 1 CAPSULE BY MOUTH ONCE A MONTH

## 2016-09-10 NOTE — Progress Notes (Signed)
Pre visit review using our clinic review tool, if applicable. No additional management support is needed unless otherwise documented below in the visit note. 

## 2016-09-18 ENCOUNTER — Other Ambulatory Visit: Payer: Self-pay | Admitting: Internal Medicine

## 2016-09-18 NOTE — Telephone Encounter (Signed)
Approved: #60 x 1

## 2016-09-18 NOTE — Telephone Encounter (Signed)
Last Rx 08/14/2016 #30 1R. Last OV 07/2016

## 2016-10-04 ENCOUNTER — Other Ambulatory Visit: Payer: Self-pay

## 2016-10-04 MED ORDER — GABAPENTIN 100 MG PO CAPS: 100.0000 mg | ORAL_CAPSULE | Freq: Three times a day (TID) | ORAL | 1 refills | Status: DC

## 2016-11-05 ENCOUNTER — Ambulatory Visit (INDEPENDENT_AMBULATORY_CARE_PROVIDER_SITE_OTHER): Payer: Medicare HMO | Admitting: Internal Medicine

## 2016-11-05 ENCOUNTER — Encounter: Payer: Self-pay | Admitting: Internal Medicine

## 2016-11-05 VITALS — BP 110/74 | HR 85 | Temp 97.8°F | Wt 244.0 lb

## 2016-11-05 DIAGNOSIS — N183 Chronic kidney disease, stage 3 unspecified: Secondary | ICD-10-CM

## 2016-11-05 DIAGNOSIS — E1121 Type 2 diabetes mellitus with diabetic nephropathy: Secondary | ICD-10-CM | POA: Diagnosis not present

## 2016-11-05 DIAGNOSIS — F39 Unspecified mood [affective] disorder: Secondary | ICD-10-CM

## 2016-11-05 DIAGNOSIS — D320 Benign neoplasm of cerebral meninges: Secondary | ICD-10-CM | POA: Diagnosis not present

## 2016-11-05 LAB — LIPID PANEL
Cholesterol: 187 mg/dL (ref 0–200)
HDL: 50.3 mg/dL (ref >39.00)
LDL Cholesterol: 108 mg/dL — ABNORMAL HIGH (ref 0–99)
NONHDL: 136.74
Total CHOL/HDL Ratio: 4
Triglycerides: 144 mg/dL (ref 0.0–149.0)
VLDL: 28.8 mg/dL (ref 0.0–40.0)

## 2016-11-05 LAB — RENAL FUNCTION PANEL
Albumin: 4.2 g/dL (ref 3.5–5.2)
BUN: 44 mg/dL — AB (ref 6–23)
CHLORIDE: 104 meq/L (ref 96–112)
CO2: 29 meq/L (ref 19–32)
Calcium: 9.7 mg/dL (ref 8.4–10.5)
Creatinine, Ser: 2.02 mg/dL — ABNORMAL HIGH (ref 0.40–1.20)
GFR: 31.53 mL/min — AB (ref >60.00)
Glucose, Bld: 95 mg/dL (ref 70–99)
PHOSPHORUS: 4.4 mg/dL (ref 2.3–4.6)
POTASSIUM: 4.2 meq/L (ref 3.5–5.1)
SODIUM: 140 meq/L (ref 135–145)

## 2016-11-05 LAB — HEMOGLOBIN A1C: HEMOGLOBIN A1C: 6.6 % — AB (ref 4.6–6.5)

## 2016-11-05 MED ORDER — LORAZEPAM 0.5 MG PO TABS: ORAL_TABLET | ORAL | 0 refills | Status: DC

## 2016-11-05 NOTE — Assessment & Plan Note (Signed)
Has down times but not persistent The lorazepam does help

## 2016-11-05 NOTE — Assessment & Plan Note (Signed)
Discussed need to be careful and lose the weight she gained

## 2016-11-05 NOTE — Assessment & Plan Note (Signed)
On ARB Will recheck 

## 2016-11-05 NOTE — Progress Notes (Signed)
Subjective:    Patient ID: Carrie [name], female    DOB: Oct 14, 1950, 66 y.o.   MRN: [id number]  HPI Here with sister for follow up of diabetes and other chronic health conditions Went on cruise and had a great time-- [place] Really felt "at peace with myself"---no phone, etc  Not checking sugars Off all medications A1c okay by Dr Candiss Norse in March Had urinary frequency--may be from her drinking a lot of water This is better  Having trouble with both shoulders Some numbness into hands Ran out of the tizanidine--- needs to refill No hand weakness---just numbness No foot numbness Discussed that this could be related to past surgery for meningioma Some neck pain No seizures Still on the gabapentin  She has gained weight Up about 10# since last visit Discussed getting back down again  Mood okay Will have down times occasionally--- grieving She can feel it coming on but will control with the lorazepam Now has cousin with cancer---always something  Current Outpatient Prescriptions on File Prior to Visit  Medication Sig Dispense Refill  . acetaminophen (TYLENOL) 500 MG tablet Take 1,000 mg by mouth every 6 (six) hours as needed for mild pain.    [name] amLODipine (NORVASC) 10 MG tablet TAKE 1 TABLET EVERY DAY 90 tablet 3  . atorvastatin (LIPITOR) 40 MG tablet TAKE ONE TABLET BY MOUTH ONE TIME DAILY 90 tablet 3  . gabapentin (NEURONTIN) 100 MG capsule Take 1 capsule (100 mg total) by mouth 3 (three) times daily. 270 capsule 1  . levETIRAcetam (KEPPRA) 1000 MG tablet TAKE ONE TABLET BY MOUTH TWICE DAILY 180 tablet 3  . LORazepam (ATIVAN) 0.5 MG tablet TAKE 1/2-1 TABLET BY MOUTH 3 TIMES A DAY AS NEEDED 60 tablet 0  . tiZANidine (ZANAFLEX) 2 MG tablet TAKE 1 TABLET (2 MG TOTAL) BY MOUTH 2 (TWO) TIMES DAILY AS NEEDED FOR MUSCLE SPASMS. 60 tablet 1  . Vitamin D, Ergocalciferol, (DRISDOL) 50000 units CAPS capsule TAKE 1 CAPSULE BY MOUTH ONCE A MONTH 3 capsule 3   No current  facility-administered medications on file prior to visit.     Allergies  Allergen Reactions  . Naproxen Sodium Anaphylaxis and Swelling  . Sulfamethoxazole-Trimethoprim Anaphylaxis and Swelling    Past Medical History:  Diagnosis Date  . Allergy   . Arthritis    "right knee" (10/26'/2017)  . CKD (chronic kidney disease), stage III    12/2015 (Dr. Lavonia Dana)  . Depression   . History of blood transfusion 2008   "when I had brain tumor surgery"  . History of MRSA infection 2008  . Hypertension   . 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 Type II diabetes mellitus ([place]) 2011    Past Surgical History:  Procedure Laterality Date  . BACK SURGERY    . BRAIN MENINGIOMA EXCISION  08/2006   Foramina magnum meningioma  . CERVICAL DISCECTOMY  1996   Dr. Alric Seton  . CESAREAN SECTION  1981  . COLONOSCOPY    . DILATION AND CURETTAGE OF UTERUS    . JOINT REPLACEMENT    . Right wrist x-ray  2007   Deg. at 1st MCP  . TOTAL HIP ARTHROPLASTY Left 03/27/2016   Procedure: LEFT TOTAL HIP ARTHROPLASTY ANTERIOR APPROACH;  Surgeon: Mcarthur Rossetti, MD;  Location: [facility];  Service: Orthopedics;  Laterality: Left;  . TUBAL LIGATION  1981    Family History  Problem Relation Age of Onset  . Hypertension Other  Strong family history   . Breast cancer Mother 56    Social History   Social History  . Marital status: Widowed    Spouse name: N/A  . Number of children: 1  . Years of education: N/A   Occupational History  . 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 Disabled since brain surgery    Social History Main Topics  . Smoking status: Never Smoker  . Smokeless tobacco: Never Used  . Alcohol use No  . Drug use: No  . Sexual activity: No   Other Topics Concern  . Not on file   Social History Narrative   Lives in family home with extended family.      No living will   Requests niece, [name], as health care POA   Would accept resuscitation attempts   No  tube feeds if cognitively aware   Review of Systems Still having knee pain--uses cane prn Sleeps okay----better with the melatonin (just lets it settle on her tongue) Appetite is good Bowels are fine    Objective:   Physical Exam  Constitutional: She appears well-developed and well-nourished. No distress.  Neck: No thyromegaly present.  Cardiovascular: Normal rate, regular rhythm, normal heart sounds and intact distal pulses.  Exam reveals no gallop.   No murmur heard. Pulmonary/Chest: Effort normal and breath sounds normal. No respiratory distress. She has no wheezes. She has no rales.  Musculoskeletal: She exhibits no edema or tenderness.  Lymphadenopathy:    She has no cervical adenopathy.  Psychiatric: She has a normal mood and affect. Her behavior is normal.          Assessment & Plan:

## 2016-11-05 NOTE — Assessment & Plan Note (Signed)
May have cervical radiculopathy due to this No seizures on the keppra

## 2016-11-05 NOTE — Assessment & Plan Note (Signed)
Still seems to be well controlled without Rx Will check levels

## 2016-12-01 ENCOUNTER — Other Ambulatory Visit: Payer: Self-pay | Admitting: Internal Medicine

## 2017-01-02 ENCOUNTER — Telehealth: Payer: Self-pay

## 2017-01-02 ENCOUNTER — Other Ambulatory Visit: Payer: Self-pay | Admitting: Internal Medicine

## 2017-01-02 NOTE — Telephone Encounter (Signed)
[facility] left v[facility]m; pt is requesting 90 day rx for tizanidine.Please advise. Last refilled # 60 x 1 on 12/03/16.Please advise.

## 2017-01-03 MED ORDER — TIZANIDINE HCL 2 MG PO TABS: 2.0000 mg | ORAL_TABLET | Freq: Two times a day (BID) | ORAL | 1 refills | Status: DC | PRN

## 2017-01-03 NOTE — Telephone Encounter (Signed)
Approved: #60 x 0 

## 2017-01-03 NOTE — Telephone Encounter (Signed)
Okay #180 x 1

## 2017-01-03 NOTE — Telephone Encounter (Signed)
Left refill on voice mail at pharmacy  

## 2017-01-03 NOTE — Telephone Encounter (Signed)
Last filled 11-05-16 #60 Last OV 11-05-16 Next OV 05-10-17

## 2017-01-03 NOTE — Telephone Encounter (Signed)
Rx sent electronically.  

## 2017-02-11 DIAGNOSIS — R809 Proteinuria, unspecified: Secondary | ICD-10-CM | POA: Diagnosis not present

## 2017-02-11 DIAGNOSIS — D631 Anemia in chronic kidney disease: Secondary | ICD-10-CM | POA: Diagnosis not present

## 2017-02-11 DIAGNOSIS — N183 Chronic kidney disease, stage 3 (moderate): Secondary | ICD-10-CM | POA: Diagnosis not present

## 2017-02-11 DIAGNOSIS — E1122 Type 2 diabetes mellitus with diabetic chronic kidney disease: Secondary | ICD-10-CM | POA: Diagnosis not present

## 2017-02-11 DIAGNOSIS — I129 Hypertensive chronic kidney disease with stage 1 through stage 4 chronic kidney disease, or unspecified chronic kidney disease: Secondary | ICD-10-CM | POA: Diagnosis not present

## 2017-03-06 ENCOUNTER — Ambulatory Visit (INDEPENDENT_AMBULATORY_CARE_PROVIDER_SITE_OTHER): Payer: Commercial Managed Care - HMO | Admitting: Orthopaedic Surgery

## 2017-03-18 ENCOUNTER — Ambulatory Visit (INDEPENDENT_AMBULATORY_CARE_PROVIDER_SITE_OTHER): Payer: Medicare HMO

## 2017-03-18 ENCOUNTER — Ambulatory Visit (INDEPENDENT_AMBULATORY_CARE_PROVIDER_SITE_OTHER): Payer: Medicare HMO | Admitting: Orthopaedic Surgery

## 2017-03-18 DIAGNOSIS — M25561 Pain in right knee: Secondary | ICD-10-CM | POA: Insufficient documentation

## 2017-03-18 DIAGNOSIS — Z96642 Presence of left artificial hip joint: Secondary | ICD-10-CM | POA: Diagnosis not present

## 2017-03-18 DIAGNOSIS — M1711 Unilateral primary osteoarthritis, right knee: Secondary | ICD-10-CM | POA: Insufficient documentation

## 2017-03-18 MED ORDER — METHYLPREDNISOLONE ACETATE 40 MG/ML IJ SUSP
40.0000 mg | INTRAMUSCULAR | Status: AC | PRN
  Administered 2017-03-18: 40 mg via INTRA_ARTICULAR

## 2017-03-18 MED ORDER — LIDOCAINE HCL 1 % IJ SOLN
3.0000 mL | INTRAMUSCULAR | Status: AC | PRN
  Administered 2017-03-18: 3 mL

## 2017-03-18 NOTE — Progress Notes (Signed)
Office Visit Note   Patient: Carrie [name]           Date of Birth: Oct 23, 1950           MRN: [id number] Visit Date: 03/18/2017              Requested by: Venia Carbon, MD [place], [place] [address] PCP: Venia Carbon, MD   Assessment & Plan: Visit Diagnoses:  1. History of total hip replacement, left   2. Acute pain of right knee   3. Unilateral primary osteoarthritis, right knee     Plan: Given the severity of her right knee arthritis and the fact this is had negatively on her quality of life and her mobility as well as the pain is causing we are recommending a total knee arthroplasty. I showed her knee model and spent a significant amount of time with her showing a knee replacement explaining what the surgery involves including a thorough discussion of risks and benefits of the surgery. She would like to have this done and given the severity of her pain today I did place a steroid injection in her knee. All questions and concerns were answered and addressed. We'll work on getting a right total knee arthroplasty scheduled for her.  Follow-Up Instructions: Return for 2 weeks post-op.   Orders:  Orders Placed This Encounter  Procedures  . Large Joint Injection/Arthrocentesis  . XR HIP UNILAT W OR W/O PELVIS 1V LEFT  . XR Knee 1-2 Views Right   No orders of the defined types were placed in this encounter.     Procedures: Large Joint Inj Date/Time: 03/18/2017 10:57 AM Performed by: Mcarthur Rossetti Authorized by: Mcarthur Rossetti   Location:  Knee Site:  R knee Ultrasound Guidance: No   Fluoroscopic Guidance: No   Arthrogram: No   Medications:  3 mL lidocaine 1 %; 40 mg methylPREDNISolone acetate 40 MG/ML     Clinical Data: No additional findings.   Subjective: No chief complaint on file. Patient is well-known to me. She is a year out from a total hip arthroplasty of her left hip. Should the hip was done well. She's  had severe knee pain for some time now and has had at least 2 right knee injections. She said the knee is very painful to her. She has tried activity modification as well as weight loss. She is assistive device. Her pain is 10 out of 10 and is detrimental effect direct is daily living, her quality of life, her mobility. Again her left hip is doing well.  HPI  Review of Systems  She currently denies any headache, chest pain, shortness of breath, fever, chills, nausea, vomiting.  Objective: Vital Signs: There were no vitals taken for this visit.  Physical Exam She is alert and awake 3 and in no acute distress Ortho Exam Examination of her left hip shows fluid range of motion with no pain at all. Examination right hip shows a normal hip. Examination of her right knee shows severe varus malalignment. Her range of motion is limited secondary to pain. There is a lot of grinding in the knee and significant patellofemoral crepitation. The knee feels ligamentously stable. Specialty Comments:  No specialty comments available.  Imaging: Xr Hip Unilat W Or W/o Pelvis 1v Left  Result Date: 03/18/2017 An AP pelvis and views left hip show well-seated total hip arthroplasty with no, getting features. There is no evidence of loosening. Her right hip  appears normal.  Xr Knee 1-2 Views Right  Result Date: 03/18/2017 An AP and lateral of the right knee show severe end-stage arthritic changes. There is complete loss of medial joint space. There is bone on bone wear. There is large loose bodies posteriorly in the knee. There are significant osteophytes throughout the knee. There is varus malalignment with significant tricompartmental arthritic changes.    PMFS History: Patient Active Problem List   Diagnosis Date Noted  . Unilateral primary osteoarthritis, right knee 03/18/2017  . Acute pain of right knee 03/18/2017  . History of total hip replacement, left 03/18/2017  . Cervical radiculopathy  07/25/2016  . Chronic kidney disease, stage III (moderate) ([place]) 05/07/2016  . Unilateral primary osteoarthritis, left hip 03/27/2016  . Status post total replacement of left hip 03/27/2016  . Left hip pain 03/05/2016  . Mood disorder ([place]) 04/01/2015  . Dental infection 02/03/2014  . Osteoarthritis of right knee 02/03/2014  . Neck pain 02/03/2014  . Advanced directives, counseling/discussion 02/03/2014  . Routine general medical examination at a health care facility 07/08/2012  . Obesity, morbid, BMI 40.0-49.9 ([place]) 07/08/2012  . Type 2 diabetes, controlled, with renal manifestation ([place])   . COMMON MIGRAINE 09/27/2006  . HYPERCHOLESTEROLEMIA 09/26/2006  . Essential hypertension, benign 09/26/2006  . ALLERGIC RHINITIS 09/26/2006  . MENINGIOMA 08/03/2006   Past Medical History:  Diagnosis Date  . Allergy   . Arthritis    "right knee" (10/26'/2017)  . CKD (chronic kidney disease), stage III    12/2015 (Dr. Lavonia Dana)  . Depression   . History of blood transfusion 2008   "when I had brain tumor surgery"  . History of MRSA infection 2008  . Hypertension   . 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 Type II diabetes mellitus ([place]) 2011    Family History  Problem Relation Age of Onset  . Hypertension Other        Strong family history   . Breast cancer Mother 15    Past Surgical History:  Procedure Laterality Date  . BACK SURGERY    . BRAIN MENINGIOMA EXCISION  08/2006   Foramina magnum meningioma  . CERVICAL DISCECTOMY  1996   Dr. Alric Seton  . CESAREAN SECTION  1981  . COLONOSCOPY    . DILATION AND CURETTAGE OF UTERUS    . JOINT REPLACEMENT    . Right wrist x-ray  2007   Deg. at 1st MCP  . TOTAL HIP ARTHROPLASTY Left 03/27/2016   Procedure: LEFT TOTAL HIP ARTHROPLASTY ANTERIOR APPROACH;  Surgeon: Mcarthur Rossetti, MD;  Location: [facility];  Service: Orthopedics;  Laterality: Left;  . TUBAL LIGATION  1981   Social History   Occupational History  . 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 Disabled since brain surgery    Social History Main Topics  . Smoking status: Never Smoker  . Smokeless tobacco: Never Used  . Alcohol use No  . Drug use: No  . Sexual activity: No

## 2017-04-04 ENCOUNTER — Telehealth (INDEPENDENT_AMBULATORY_CARE_PROVIDER_SITE_OTHER): Payer: Self-pay | Admitting: Orthopaedic Surgery

## 2017-04-04 NOTE — Telephone Encounter (Signed)
Please advise 

## 2017-04-04 NOTE — Telephone Encounter (Signed)
[name],Carrie  [birth year] [phone]    Pt stated she is having pain shooting from the right side of her body. The pain is moving all the way around to her thigh and back. Pt stated she hasn't sleep and she is in a lot of pain. She wanted to know if Dr. [name] could prescribe pain med or does she need to be scheduled for an office visit.

## 2017-04-04 NOTE — Telephone Encounter (Signed)
Patient called asked if she can be contacted when Rx is called into the pharmacy. The number to contact patient is [phone]

## 2017-04-04 NOTE — Telephone Encounter (Signed)
Called into pharmacy

## 2017-04-04 NOTE — Telephone Encounter (Signed)
Patient aware Rx called in

## 2017-04-04 NOTE — Telephone Encounter (Signed)
Can either try tylenol #3 or tramadol, 1-2 every 6-8 hours as needed, #60, no refills

## 2017-04-09 ENCOUNTER — Other Ambulatory Visit (INDEPENDENT_AMBULATORY_CARE_PROVIDER_SITE_OTHER): Payer: Self-pay

## 2017-04-19 NOTE — Pre-Procedure Instructions (Signed)
Carrie [name]  04/19/2017      [facility] - [place], [address] [place] [address] Phone: [phone] Fax: [phone]  [id number] [place], [address] [place] [address] Phone: [phone] Fax: [phone]    Your procedure is scheduled on Tuesday, April 30, 2017  Report to [facility] Admitting Entrance "A" at 2:00P.M.   Call this number if you have problems the morning of surgery:  [phone]   Remember:  Do not eat food or drink liquids after midnight.  Take these medicines the morning of surgery with A SIP OF WATER: AmLODipine (NORVASC), Gabapentin (NEURONTIN, and LevETIRAcetam (KEPPRA). , If needed Acetaminophen-codeine (TYLENOL #3) for pain and LORazepam (ATIVAN) for anxiety.  7 days before surgery (Nov. 20), stop taking all Aspirins, Vitamins, Fish oils, and Herbal medications. Also stop all NSAIDS i.e. Advil, Ibuprofen, Motrin, Aleve, Anaprox, Naproxen, BC and Goody Powders.  How to Manage Your Diabetes Before and After Surgery  Why is it important to control my blood sugar before and after surgery? . Improving blood sugar levels before and after surgery helps healing and can limit problems. . A way of improving blood sugar control is eating a healthy diet by: o  Eating less sugar and carbohydrates o  Increasing activity/exercise o  Talking with your doctor about reaching your blood sugar goals . High blood sugars (greater than 180 mg/dL) can raise your risk of infections and slow your recovery, so you will need to focus on controlling your diabetes during the weeks before surgery. . Make sure that the doctor who takes care of your diabetes knows about your planned surgery including the date and location.  How do I manage my blood sugar before surgery? . Check your blood sugar at least 4 times a day, starting 2 days before surgery, to make sure that the level is  not too high or low. o Check your blood sugar the morning of your surgery when you wake up and every 2 hours until you get to the Short Stay unit. . If your blood sugar is less than 70 mg/dL, you will need to treat for low blood sugar: o Do not take insulin. o Treat a low blood sugar (less than 70 mg/dL) with  cup of clear juice (cranberry or apple), 4 glucose tablets, OR glucose gel. Recheck blood sugar in 15 minutes after treatment (to make sure it is greater than 70 mg/dL). If your blood sugar is not greater than 70 mg/dL on recheck, call [phone] o  for further instructions. . Report your blood sugar to the short stay nurse when you get to Short Stay.  . If you are admitted to the hospital after surgery: o Your blood sugar will be checked by the staff and you will probably be given insulin after surgery (instead of oral diabetes medicines) to make sure you have good blood sugar levels. o The goal for blood sugar control after surgery is 80-180 mg/dL.  . If your CBG is greater than 220 mg/dL, call us at [phone]   Do not wear jewelry, make-up or nail polish.  Do not wear lotions, powders, perfumes, or deodorant.  Do not shave 48 hours prior to surgery.    Do not bring valuables to the hospital.  [facility] is not responsible for any belongings or valuables.  Contacts, dentures or bridgework may not be worn into surgery.  Leave your suitcase in the car.  After surgery it may be brought to your room.  For patients admitted to the hospital, discharge time will be determined by your treatment team.  Patients discharged the day of surgery will not be allowed to drive home.   Special instructions:   [facility]- Preparing For Surgery  Before surgery, you can play an important role. Because skin is not sterile, your skin needs to be as free of germs as possible. You can reduce the number of germs on your skin by washing with CHG (chlorahexidine gluconate) Soap before surgery.  CHG  is an antiseptic cleaner which kills germs and bonds with the skin to continue killing germs even after washing.  Please do not use if you have an allergy to CHG or antibacterial soaps. If your skin becomes reddened/irritated stop using the CHG.  Do not shave (including legs and underarms) for at least 48 hours prior to first CHG shower. It is OK to shave your face.  Please follow these instructions carefully.   1. Shower the NIGHT BEFORE SURGERY and the MORNING OF SURGERY with CHG.   2. If you chose to wash your hair, wash your hair first as usual with your normal shampoo.  3. After you shampoo, rinse your hair and body thoroughly to remove the shampoo.  4. Use CHG as you would any other liquid soap. You can apply CHG directly to the skin and wash gently with a scrungie or a clean washcloth.   5. Apply the CHG Soap to your body ONLY FROM THE NECK DOWN.  Do not use on open wounds or open sores. Avoid contact with your eyes, ears, mouth and genitals (private parts). Wash Face and genitals (private parts)  with your normal soap.  6. Wash thoroughly, paying special attention to the area where your surgery will be performed.  7. Thoroughly rinse your body with warm water from the neck down.  8. DO NOT shower/wash with your normal soap after using and rinsing off the CHG Soap.  9. Pat yourself dry with a CLEAN TOWEL.  10. Wear CLEAN PAJAMAS to bed the night before surgery, wear comfortable clothes the morning of surgery  11. Place CLEAN SHEETS on your bed the night of your first shower and DO NOT SLEEP WITH PETS.  Day of Surgery: Do not apply any deodorants/lotions. Please wear clean clothes to the hospital/surgery center.    Please read over the following fact sheets that you were given. Pain Booklet, Coughing and Deep Breathing, MRSA Information and Surgical Site Infection Prevention

## 2017-04-22 ENCOUNTER — Other Ambulatory Visit: Payer: Self-pay

## 2017-04-22 ENCOUNTER — Encounter (HOSPITAL_COMMUNITY): Payer: Self-pay

## 2017-04-22 ENCOUNTER — Encounter (HOSPITAL_COMMUNITY)
Admission: RE | Admit: 2017-04-22 | Discharge: 2017-04-22 | Disposition: A | Discharge status: Home / Self Care | Payer: Medicare HMO | Source: Ambulatory Visit | Attending: Orthopaedic Surgery | Admitting: Orthopaedic Surgery

## 2017-04-22 DIAGNOSIS — Z79899 Other long term (current) drug therapy: Secondary | ICD-10-CM | POA: Diagnosis not present

## 2017-04-22 DIAGNOSIS — E1122 Type 2 diabetes mellitus with diabetic chronic kidney disease: Secondary | ICD-10-CM | POA: Insufficient documentation

## 2017-04-22 DIAGNOSIS — Z01810 Encounter for preprocedural cardiovascular examination: Secondary | ICD-10-CM | POA: Diagnosis not present

## 2017-04-22 DIAGNOSIS — I129 Hypertensive chronic kidney disease with stage 1 through stage 4 chronic kidney disease, or unspecified chronic kidney disease: Secondary | ICD-10-CM | POA: Insufficient documentation

## 2017-04-22 DIAGNOSIS — M1990 Unspecified osteoarthritis, unspecified site: Secondary | ICD-10-CM | POA: Diagnosis not present

## 2017-04-22 DIAGNOSIS — N183 Chronic kidney disease, stage 3 (moderate): Secondary | ICD-10-CM | POA: Insufficient documentation

## 2017-04-22 DIAGNOSIS — F329 Major depressive disorder, single episode, unspecified: Secondary | ICD-10-CM | POA: Insufficient documentation

## 2017-04-22 DIAGNOSIS — Z01812 Encounter for preprocedural laboratory examination: Secondary | ICD-10-CM | POA: Diagnosis not present

## 2017-04-22 DIAGNOSIS — Z6841 Body Mass Index (BMI) 40.0 and over, adult: Secondary | ICD-10-CM | POA: Diagnosis not present

## 2017-04-22 LAB — GLUCOSE, CAPILLARY: Glucose-Capillary: 114 mg/dL — ABNORMAL HIGH (ref 65–99)

## 2017-04-22 LAB — SURGICAL PCR SCREEN
MRSA, PCR: POSITIVE — AB
STAPHYLOCOCCUS AUREUS: POSITIVE — AB

## 2017-04-22 LAB — BASIC METABOLIC PANEL
Anion gap: 8 (ref 5–15)
BUN: 39 mg/dL — ABNORMAL HIGH (ref 6–20)
CALCIUM: 8.9 mg/dL (ref 8.9–10.3)
CO2: 25 mmol/L (ref 22–32)
CREATININE: 2.52 mg/dL — AB (ref 0.44–1.00)
Chloride: 106 mmol/L (ref 101–111)
GFR calc non Af Amer: 18 mL/min — ABNORMAL LOW (ref >60)
GFR, EST AFRICAN AMERICAN: 21 mL/min — AB (ref >60)
Glucose, Bld: 118 mg/dL — ABNORMAL HIGH (ref 65–99)
Potassium: 4.1 mmol/L (ref 3.5–5.1)
SODIUM: 139 mmol/L (ref 135–145)

## 2017-04-22 LAB — HEMOGLOBIN A1C
HEMOGLOBIN A1C: 6.7 % — AB (ref 4.8–5.6)
Mean Plasma Glucose: 145.59 mg/dL

## 2017-04-22 LAB — CBC
HCT: 34.9 % — ABNORMAL LOW (ref 36.0–46.0)
Hemoglobin: 10.9 g/dL — ABNORMAL LOW (ref 12.0–15.0)
MCH: 26.5 pg (ref 26.0–34.0)
MCHC: 31.2 g/dL (ref 30.0–36.0)
MCV: 84.7 fL (ref 78.0–100.0)
PLATELETS: 292 10*3/uL (ref 150–400)
RBC: 4.12 MIL/uL (ref 3.87–5.11)
RDW: 15.3 % (ref 11.5–15.5)
WBC: 7.3 10*3/uL (ref 4.0–10.5)

## 2017-04-22 NOTE — Pre-Procedure Instructions (Signed)
Carrie [name]  04/22/2017      [facility] - [place], [address] [place] [address] Phone: [phone] Fax: [phone]  [id number] - [place], [address] [place] [address] Phone: [phone] Fax: [phone]    Your procedure is scheduled on Tuesday, April 30, 2017  Report to [facility] Admitting Entrance "A" at 2:00P.M.   Call this number if you have problems the morning of surgery:  [phone]   Remember:  Do not eat food or drink liquids after midnight.  Take these medicines the morning of surgery with A SIP OF WATER: AmLODipine (NORVASC), Gabapentin (NEURONTIN, and LevETIRAcetam (KEPPRA). ,  If needed Acetaminophen-codeine (TYLENOL #3) for pain and LORazepam (ATIVAN) for anxiety.  7 days before surgery (Nov. 20), stop taking all Aspirins, Vitamins, Fish oils, and Herbal medications. Also stop all NSAIDS i.e. Advil, Ibuprofen, Motrin, Aleve, Anaprox, Naproxen, BC and Goody Powders.  How to Manage Your Diabetes Before and After Surgery  Why is it important to control my blood sugar before and after surgery? . Improving blood sugar levels before and after surgery helps healing and can limit problems. . A way of improving blood sugar control is eating a healthy diet by: o  Eating less sugar and carbohydrates o  Increasing activity/exercise o  Talking with your doctor about reaching your blood sugar goals . High blood sugars (greater than 180 mg/dL) can raise your risk of infections and slow your recovery, so you will need to focus on controlling your diabetes during the weeks before surgery. . Make sure that the doctor who takes care of your diabetes knows about your planned surgery including the date and location.  How do I manage my blood sugar before surgery? . Check your blood sugar at least 4 times a day, starting 2 days before surgery, to make sure that the level  is not too high or low. o Check your blood sugar the morning of your surgery when you wake up and every 2 hours until you get to the Short Stay unit. . If your blood sugar is less than 70 mg/dL, you will need to treat for low blood sugar: o Do not take insulin. o Treat a low blood sugar (less than 70 mg/dL) with  cup of clear juice (cranberry or apple), 4 glucose tablets, OR glucose gel. Recheck blood sugar in 15 minutes after treatment (to make sure it is greater than 70 mg/dL). If your blood sugar is not greater than 70 mg/dL on recheck, call [phone] o  for further instructions. . Report your blood sugar to the short stay nurse when you get to Short Stay.  . If you are admitted to the hospital after surgery: o Your blood sugar will be checked by the staff and you will probably be given insulin after surgery (instead of oral diabetes medicines) to make sure you have good blood sugar levels. o The goal for blood sugar control after surgery is 80-180 mg/dL.  . If your CBG is greater than 220 mg/dL, call us at [phone]   Do not wear jewelry, make-up or nail polish.  Do not wear lotions, powders, perfumes, or deodorant.  Do not shave 48 hours prior to surgery.    Do not bring valuables to the hospital.  [facility] is not responsible for any belongings or valuables.  Contacts, dentures or bridgework may not be worn into surgery.  Leave your suitcase in the car.  After surgery it may be brought to your room.  For patients admitted to the hospital, discharge time will be determined by your treatment team.  Patients discharged the day of surgery will not be allowed to drive home.   Special instructions:   [facility]- Preparing For Surgery  Before surgery, you can play an important role. Because skin is not sterile, your skin needs to be as free of germs as possible. You can reduce the number of germs on your skin by washing with CHG (chlorahexidine gluconate) Soap before surgery.   CHG is an antiseptic cleaner which kills germs and bonds with the skin to continue killing germs even after washing.  Please do not use if you have an allergy to CHG or antibacterial soaps. If your skin becomes reddened/irritated stop using the CHG.  Do not shave (including legs and underarms) for at least 48 hours prior to first CHG shower. It is OK to shave your face.  Please follow these instructions carefully.   1. Shower the NIGHT BEFORE SURGERY and the MORNING OF SURGERY with CHG.   2. If you chose to wash your hair, wash your hair first as usual with your normal shampoo.  3. After you shampoo, rinse your hair and body thoroughly to remove the shampoo.  4. Use CHG as you would any other liquid soap. You can apply CHG directly to the skin and wash gently with a scrungie or a clean washcloth.   5. Apply the CHG Soap to your body ONLY FROM THE NECK DOWN.  Do not use on open wounds or open sores. Avoid contact with your eyes, ears, mouth and genitals (private parts). Wash Face and genitals (private parts)  with your normal soap.  6. Wash thoroughly, paying special attention to the area where your surgery will be performed.  7. Thoroughly rinse your body with warm water from the neck down.  8. DO NOT shower/wash with your normal soap after using and rinsing off the CHG Soap.  9. Pat yourself dry with a CLEAN TOWEL.  10. Wear CLEAN PAJAMAS to bed the night before surgery, wear comfortable clothes the morning of surgery  11. Place CLEAN SHEETS on your bed the night of your first shower and DO NOT SLEEP WITH PETS.  Day of Surgery: Do not apply any deodorants/lotions. Please wear clean clothes to the hospital/surgery center.    Please read over the following fact sheets that you were given. Pain Booklet, Coughing and Deep Breathing, MRSA Information and Surgical Site Infection Prevention

## 2017-04-22 NOTE — Pre-Procedure Instructions (Signed)
Sharnetta D Baskin  04/22/2017      [facility] - [place], [address] [place] [address] Phone: [phone] Fax: [phone]  [id number] - [place], [address] [place] [address] Phone: [phone] Fax: [phone]    Your procedure is scheduled on Tuesday, April 30, 2017  Report to [facility] Admitting Entrance "A" at 2:00P.M.   Call this number if you have problems the morning of surgery:  [phone]   Remember:  Do not eat food or drink liquids after midnight.  Take these medicines the morning of surgery with A SIP OF WATER: AmLODipine (NORVASC), Gabapentin (NEURONTIN, and LevETIRAcetam (KEPPRA). ,  If needed Acetaminophen-codeine (TYLENOL #3) for pain and LORazepam (ATIVAN) for anxiety.  7 days before surgery (Nov. 20), stop taking all Aspirins, Vitamins, Fish oils, and Herbal medications. Also stop all NSAIDS i.e. Advil, Ibuprofen, Motrin, Aleve, Anaprox, Naproxen, BC and Goody Powders.  How to Manage Your Diabetes Before and After Surgery  Why is it important to control my blood sugar before and after surgery? . Improving blood sugar levels before and after surgery helps healing and can limit problems. . A way of improving blood sugar control is eating a healthy diet by: o  Eating less sugar and carbohydrates o  Increasing activity/exercise o  Talking with your doctor about reaching your blood sugar goals . High blood sugars (greater than 180 mg/dL) can raise your risk of infections and slow your recovery, so you will need to focus on controlling your diabetes during the weeks before surgery. . Make sure that the doctor who takes care of your diabetes knows about your planned surgery including the date and location.  How do I manage my blood sugar before surgery? . Check your blood sugar at least 4 times a day, starting 2 days before surgery, to make sure that the level  is not too high or low. o Check your blood sugar the morning of your surgery when you wake up and every 2 hours until you get to the Short Stay unit. . If your blood sugar is less than 70 mg/dL, you will need to treat for low blood sugar: o Do not take insulin. o Treat a low blood sugar (less than 70 mg/dL) with  cup of clear juice (cranberry or apple), 4 glucose tablets, OR glucose gel. Recheck blood sugar in 15 minutes after treatment (to make sure it is greater than 70 mg/dL). If your blood sugar is not greater than 70 mg/dL on recheck, call [phone] o  for further instructions. . Report your blood sugar to the short stay nurse when you get to Short Stay.  . If you are admitted to the hospital after surgery: o Your blood sugar will be checked by the staff and you will probably be given insulin after surgery (instead of oral diabetes medicines) to make sure you have good blood sugar levels. o The goal for blood sugar control after surgery is 80-180 mg/dL.  . If your CBG is greater than 220 mg/dL, call us at [phone]   Do not wear jewelry, make-up or nail polish.  Do not wear lotions, powders, perfumes, or deodorant.  Do not shave 48 hours prior to surgery.    Do not bring valuables to the hospital.  [facility] is not responsible for any belongings or valuables.  Contacts, dentures or bridgework may not be worn into surgery.  Leave your suitcase in the car.  After surgery it may be brought to your room.  For patients admitted to the hospital, discharge time will be determined by your treatment team.  Patients discharged the day of surgery will not be allowed to drive home.   Special instructions:   [facility]- Preparing For Surgery  Before surgery, you can play an important role. Because skin is not sterile, your skin needs to be as free of germs as possible. You can reduce the number of germs on your skin by washing with CHG (chlorahexidine gluconate) Soap before surgery.   CHG is an antiseptic cleaner which kills germs and bonds with the skin to continue killing germs even after washing.  Please do not use if you have an allergy to CHG or antibacterial soaps. If your skin becomes reddened/irritated stop using the CHG.  Do not shave (including legs and underarms) for at least 48 hours prior to first CHG shower. It is OK to shave your face.  Please follow these instructions carefully.   1. Shower the NIGHT BEFORE SURGERY and the MORNING OF SURGERY with CHG.   2. If you chose to wash your hair, wash your hair first as usual with your normal shampoo.  3. After you shampoo, rinse your hair and body thoroughly to remove the shampoo.  4. Use CHG as you would any other liquid soap. You can apply CHG directly to the skin and wash gently with a scrungie or a clean washcloth.   5. Apply the CHG Soap to your body ONLY FROM THE NECK DOWN.  Do not use on open wounds or open sores. Avoid contact with your eyes, ears, mouth and genitals (private parts). Wash Face and genitals (private parts)  with your normal soap.  6. Wash thoroughly, paying special attention to the area where your surgery will be performed.  7. Thoroughly rinse your body with warm water from the neck down.  8. DO NOT shower/wash with your normal soap after using and rinsing off the CHG Soap.  9. Pat yourself dry with a CLEAN TOWEL.  10. Wear CLEAN PAJAMAS to bed the night before surgery, wear comfortable clothes the morning of surgery  11. Place CLEAN SHEETS on your bed the night of your first shower and DO NOT SLEEP WITH PETS.  Day of Surgery: Do not apply any deodorants/lotions. Please wear clean clothes to the hospital/surgery center.    Please read over the following fact sheets that you were given. Pain Booklet, Coughing and Deep Breathing, MRSA Information and Surgical Site Infection Prevention

## 2017-04-23 ENCOUNTER — Other Ambulatory Visit (INDEPENDENT_AMBULATORY_CARE_PROVIDER_SITE_OTHER): Payer: Self-pay | Admitting: Physician Assistant

## 2017-04-24 NOTE — Progress Notes (Addendum)
Anesthesia chart review: Patient is a 66-year-old female scheduled for right TKA on 04/30/17 by Dr. Christopher Blackman.   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 BMI is consistent with morbid obesity.  - PCP is Dr. RIchard Letvak at [facility].  - Nephrologist is Dr. Sarath Kolluru with [facility] ([phone]), last visit 02/11/17. Last BMET there on 08/06/16 showed BUN 37, Cr 1.86, eGFR 32.  BP 128/71   Pulse 82   Temp 36.7 C   Resp 20   Ht 5' 3" (1.6 m)   Wt 260 lb 4.8 oz (118.1 kg)   SpO2 98%   BMI 46.11 kg/m   Meds include amlodipine, Lipitor, gabapentin, Keppra, Ativan, losartan-HCTZ, Zanaflex. She is no longer on metformin (stopped ~ 1 year ago due to CKD.)   EKG 04/22/17: NSR.  Echo 06/27/07:SUMMARY - Limited study due to poor acoustic windows. - Overall left ventricular systolic function was normal. Left    ventricular ejection fraction was estimated , range being 60    % to 65 %. There was no diagnostic evidence of left    ventricular regional wall motion abnormalities. Features were    consistent with mild diastolic dysfunction. - Left atrial size was at the upper limits of normal. - There was a small pericardial effusion.  Preoperative labs noted. H/H 10.9/34.9, PLT 292. Glucose 118. BUN 39, Cr 2.52. (Previous Cr 2.02 on 11/05/16, 1.86 on 08/06/16, 1.73 on 05/07/16, 1.83 on 03/28/16, 1.77 on 03/26/16). A1c 6.7.   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 She saw her nephrologist within the past few months, but I don't see that a BMET was done. I attempted to call Dr. Kolluru's office this morning multiple times, but there was not answer. I wanted to have him review labs for preoperative recommendations. I have sent him a  staff message, but unsure if he is even available this week to receive messages. If no response received then I will attempt to call his office after the Thanksgiving holiday and review further with one of our anesthesiologists if unable to get additional nephrology input. I have updated Sherrie at Dr. Blackman's office.    , PA-C [facility]Anesthesiology Phone [phone] 04/24/2017 11:34 AM  Addendum: Nephrologist Dr. Kulluru called me this morning. We reviewed labs trends and plans for TKA tomorrow. He recommended repeat labs prior to surgery, but if results are stable when compared to 04/22/17 then he did not feel case needed to be cancelled. He is going to change her antihypertensive medication from losartan-HCTZ to losartan only. He will call in the prescription and have his nurse contact the patient. I will put in an order for a STAT BMET on the day of surgery. If results stable and otherwise not acute changes then I would anticipate that she could proceed as planned. However, if renal function has worsened further then may need to reconsider. She may also benefit for post-operative Hospitalist or nephrology consult, but will defer to surgeon. I have notified Sherrie at Dr. Blackman's office.    , PA-C [facility]esthesiology Phone [phone] 04/29/2017 10:07 AM   

## 2017-04-29 MED ORDER — TRANEXAMIC ACID 1000 MG/10ML IV SOLN
1000.0000 mg | INTRAVENOUS | Status: DC
  Filled 2017-04-29: qty 10

## 2017-04-29 MED ORDER — DEXTROSE 5 % IV SOLN
3.0000 g | INTRAVENOUS | Status: AC
  Administered 2017-04-30: 3 g via INTRAVENOUS
  Filled 2017-04-29: qty 3

## 2017-04-30 ENCOUNTER — Inpatient Hospital Stay (HOSPITAL_COMMUNITY): Payer: Medicare HMO

## 2017-04-30 ENCOUNTER — Encounter (HOSPITAL_COMMUNITY)
Admission: RE | Disposition: A | Discharge status: Skilled Nursing Facility | Payer: Self-pay | Source: Ambulatory Visit | Attending: Orthopaedic Surgery

## 2017-04-30 ENCOUNTER — Encounter (HOSPITAL_COMMUNITY): Payer: Self-pay

## 2017-04-30 ENCOUNTER — Inpatient Hospital Stay (HOSPITAL_COMMUNITY): Payer: Medicare HMO | Admitting: Vascular Surgery

## 2017-04-30 ENCOUNTER — Other Ambulatory Visit: Payer: Self-pay

## 2017-04-30 ENCOUNTER — Inpatient Hospital Stay (HOSPITAL_COMMUNITY)
Admission: RE | Admit: 2017-04-30 | Discharge: 2017-05-03 | DRG: 470 | Disposition: A | Discharge status: Skilled Nursing Facility | Payer: Medicare HMO | Source: Ambulatory Visit | Attending: Orthopaedic Surgery | Admitting: Orthopaedic Surgery

## 2017-04-30 DIAGNOSIS — Z8614 Personal history of Methicillin resistant Staphylococcus aureus infection: Secondary | ICD-10-CM | POA: Diagnosis not present

## 2017-04-30 DIAGNOSIS — E1122 Type 2 diabetes mellitus with diabetic chronic kidney disease: Secondary | ICD-10-CM | POA: Diagnosis present

## 2017-04-30 DIAGNOSIS — R569 Unspecified convulsions: Secondary | ICD-10-CM | POA: Diagnosis not present

## 2017-04-30 DIAGNOSIS — Z6841 Body Mass Index (BMI) 40.0 and over, adult: Secondary | ICD-10-CM

## 2017-04-30 DIAGNOSIS — R52 Pain, unspecified: Secondary | ICD-10-CM | POA: Diagnosis not present

## 2017-04-30 DIAGNOSIS — I129 Hypertensive chronic kidney disease with stage 1 through stage 4 chronic kidney disease, or unspecified chronic kidney disease: Secondary | ICD-10-CM | POA: Diagnosis present

## 2017-04-30 DIAGNOSIS — D62 Acute posthemorrhagic anemia: Secondary | ICD-10-CM | POA: Diagnosis not present

## 2017-04-30 DIAGNOSIS — M1711 Unilateral primary osteoarthritis, right knee: Secondary | ICD-10-CM | POA: Diagnosis not present

## 2017-04-30 DIAGNOSIS — E785 Hyperlipidemia, unspecified: Secondary | ICD-10-CM | POA: Diagnosis not present

## 2017-04-30 DIAGNOSIS — Z8249 Family history of ischemic heart disease and other diseases of the circulatory system: Secondary | ICD-10-CM

## 2017-04-30 DIAGNOSIS — E7800 Pure hypercholesterolemia, unspecified: Secondary | ICD-10-CM | POA: Diagnosis present

## 2017-04-30 DIAGNOSIS — S8990XA Unspecified injury of unspecified lower leg, initial encounter: Secondary | ICD-10-CM | POA: Diagnosis not present

## 2017-04-30 DIAGNOSIS — F411 Generalized anxiety disorder: Secondary | ICD-10-CM | POA: Diagnosis not present

## 2017-04-30 DIAGNOSIS — G8911 Acute pain due to trauma: Secondary | ICD-10-CM | POA: Diagnosis not present

## 2017-04-30 DIAGNOSIS — Z96642 Presence of left artificial hip joint: Secondary | ICD-10-CM | POA: Diagnosis present

## 2017-04-30 DIAGNOSIS — G8918 Other acute postprocedural pain: Secondary | ICD-10-CM | POA: Diagnosis not present

## 2017-04-30 DIAGNOSIS — Z5189 Encounter for other specified aftercare: Secondary | ICD-10-CM | POA: Diagnosis not present

## 2017-04-30 DIAGNOSIS — Z96651 Presence of right artificial knee joint: Secondary | ICD-10-CM

## 2017-04-30 DIAGNOSIS — M25561 Pain in right knee: Secondary | ICD-10-CM | POA: Diagnosis present

## 2017-04-30 DIAGNOSIS — M6281 Muscle weakness (generalized): Secondary | ICD-10-CM | POA: Diagnosis not present

## 2017-04-30 DIAGNOSIS — Z471 Aftercare following joint replacement surgery: Secondary | ICD-10-CM | POA: Diagnosis not present

## 2017-04-30 DIAGNOSIS — M62838 Other muscle spasm: Secondary | ICD-10-CM | POA: Diagnosis not present

## 2017-04-30 DIAGNOSIS — M25761 Osteophyte, right knee: Secondary | ICD-10-CM | POA: Diagnosis not present

## 2017-04-30 DIAGNOSIS — I10 Essential (primary) hypertension: Secondary | ICD-10-CM | POA: Diagnosis not present

## 2017-04-30 DIAGNOSIS — N183 Chronic kidney disease, stage 3 (moderate): Secondary | ICD-10-CM | POA: Diagnosis not present

## 2017-04-30 HISTORY — PX: TOTAL KNEE ARTHROPLASTY: SHX125

## 2017-04-30 LAB — BASIC METABOLIC PANEL
Anion gap: 10 (ref 5–15)
BUN: 43 mg/dL — ABNORMAL HIGH (ref 6–20)
CHLORIDE: 105 mmol/L (ref 101–111)
CO2: 24 mmol/L (ref 22–32)
CREATININE: 2.38 mg/dL — AB (ref 0.44–1.00)
Calcium: 9.1 mg/dL (ref 8.9–10.3)
GFR calc non Af Amer: 20 mL/min — ABNORMAL LOW (ref >60)
GFR, EST AFRICAN AMERICAN: 23 mL/min — AB (ref >60)
Glucose, Bld: 123 mg/dL — ABNORMAL HIGH (ref 65–99)
POTASSIUM: 4.5 mmol/L (ref 3.5–5.1)
Sodium: 139 mmol/L (ref 135–145)

## 2017-04-30 LAB — GLUCOSE, CAPILLARY
GLUCOSE-CAPILLARY: 171 mg/dL — AB (ref 65–99)
Glucose-Capillary: 117 mg/dL — ABNORMAL HIGH (ref 65–99)
Glucose-Capillary: 156 mg/dL — ABNORMAL HIGH (ref 65–99)

## 2017-04-30 LAB — MRSA PCR SCREENING: MRSA BY PCR: NEGATIVE

## 2017-04-30 SURGERY — ARTHROPLASTY, KNEE, TOTAL
Anesthesia: General | Site: Knee | Laterality: Right

## 2017-04-30 MED ORDER — FENTANYL CITRATE (PF) 100 MCG/2ML IJ SOLN
INTRAMUSCULAR | Status: DC | PRN
  Administered 2017-04-30: 50 ug via INTRAVENOUS
  Administered 2017-04-30: 150 ug via INTRAVENOUS

## 2017-04-30 MED ORDER — ZOLPIDEM TARTRATE 5 MG PO TABS: 5.0000 mg | ORAL_TABLET | Freq: Every evening | ORAL | Status: DC | PRN

## 2017-04-30 MED ORDER — METOCLOPRAMIDE HCL 5 MG/ML IJ SOLN: 5.0000 mg | Freq: Three times a day (TID) | INTRAMUSCULAR | Status: DC | PRN

## 2017-04-30 MED ORDER — ACETAMINOPHEN 325 MG PO TABS
650.0000 mg | ORAL_TABLET | ORAL | Status: DC | PRN
  Administered 2017-05-02 – 2017-05-03 (×2): 650 mg via ORAL
  Filled 2017-04-30 (×2): qty 2

## 2017-04-30 MED ORDER — DEXAMETHASONE SODIUM PHOSPHATE 10 MG/ML IJ SOLN
INTRAMUSCULAR | Status: DC | PRN
  Administered 2017-04-30: 4 mg via INTRAVENOUS

## 2017-04-30 MED ORDER — HYDROMORPHONE HCL 1 MG/ML IJ SOLN
INTRAMUSCULAR | Status: AC
  Filled 2017-04-30: qty 1

## 2017-04-30 MED ORDER — MENTHOL 3 MG MT LOZG: 1.0000 | LOZENGE | OROMUCOSAL | Status: DC | PRN

## 2017-04-30 MED ORDER — LEVETIRACETAM 500 MG PO TABS
1000.0000 mg | ORAL_TABLET | Freq: Two times a day (BID) | ORAL | Status: DC
  Administered 2017-04-30 – 2017-05-03 (×6): 1000 mg via ORAL
  Filled 2017-04-30 (×6): qty 2

## 2017-04-30 MED ORDER — FENTANYL CITRATE (PF) 100 MCG/2ML IJ SOLN
INTRAMUSCULAR | Status: AC
  Filled 2017-04-30: qty 2

## 2017-04-30 MED ORDER — SODIUM CHLORIDE 0.9 % IR SOLN
Status: DC | PRN
  Administered 2017-04-30: 3000 mL

## 2017-04-30 MED ORDER — POLYETHYLENE GLYCOL 3350 17 G PO PACK: 17.0000 g | PACK | Freq: Every day | ORAL | Status: DC | PRN

## 2017-04-30 MED ORDER — SODIUM CHLORIDE 0.9 % IV SOLN
INTRAVENOUS | Status: DC
  Administered 2017-04-30: 15:00:00 via INTRAVENOUS

## 2017-04-30 MED ORDER — OXYCODONE HCL 5 MG PO TABS
10.0000 mg | ORAL_TABLET | ORAL | Status: DC | PRN
  Administered 2017-04-30 – 2017-05-03 (×13): 10 mg via ORAL
  Filled 2017-04-30 (×12): qty 2

## 2017-04-30 MED ORDER — HYDROCODONE-ACETAMINOPHEN 5-325 MG PO TABS
1.0000 | ORAL_TABLET | ORAL | Status: DC | PRN
  Administered 2017-04-30 – 2017-05-01 (×3): 2 via ORAL
  Filled 2017-04-30 (×3): qty 2

## 2017-04-30 MED ORDER — FENTANYL CITRATE (PF) 100 MCG/2ML IJ SOLN
INTRAMUSCULAR | Status: AC
  Administered 2017-04-30: 100 ug
  Filled 2017-04-30: qty 2

## 2017-04-30 MED ORDER — PATIENT'S GUIDE TO USING COUMADIN BOOK
Freq: Once | Status: AC
  Filled 2017-04-30: qty 1

## 2017-04-30 MED ORDER — ONDANSETRON HCL 4 MG/2ML IJ SOLN: 4.0000 mg | Freq: Four times a day (QID) | INTRAMUSCULAR | Status: DC | PRN

## 2017-04-30 MED ORDER — METHOCARBAMOL 500 MG PO TABS
500.0000 mg | ORAL_TABLET | Freq: Four times a day (QID) | ORAL | Status: DC | PRN
  Administered 2017-04-30 – 2017-05-03 (×7): 500 mg via ORAL
  Filled 2017-04-30 (×6): qty 1

## 2017-04-30 MED ORDER — ROCURONIUM BROMIDE 100 MG/10ML IV SOLN
INTRAVENOUS | Status: DC | PRN
  Administered 2017-04-30: 70 mg via INTRAVENOUS

## 2017-04-30 MED ORDER — PHENOL 1.4 % MT LIQD: 1.0000 | OROMUCOSAL | Status: DC | PRN

## 2017-04-30 MED ORDER — MIDAZOLAM HCL 2 MG/2ML IJ SOLN
INTRAMUSCULAR | Status: AC
  Administered 2017-04-30: 2 mg
  Filled 2017-04-30: qty 2

## 2017-04-30 MED ORDER — FENTANYL CITRATE (PF) 250 MCG/5ML IJ SOLN
INTRAMUSCULAR | Status: AC
  Filled 2017-04-30: qty 5

## 2017-04-30 MED ORDER — METHOCARBAMOL 1000 MG/10ML IJ SOLN
500.0000 mg | Freq: Four times a day (QID) | INTRAVENOUS | Status: DC | PRN
  Filled 2017-04-30: qty 5

## 2017-04-30 MED ORDER — DEXAMETHASONE SODIUM PHOSPHATE 10 MG/ML IJ SOLN
INTRAMUSCULAR | Status: AC
  Filled 2017-04-30: qty 1

## 2017-04-30 MED ORDER — PHENYLEPHRINE HCL 10 MG/ML IJ SOLN
INTRAVENOUS | Status: DC | PRN
  Administered 2017-04-30: 20 ug/min via INTRAVENOUS

## 2017-04-30 MED ORDER — METHOCARBAMOL 500 MG PO TABS
ORAL_TABLET | ORAL | Status: AC
  Filled 2017-04-30: qty 1

## 2017-04-30 MED ORDER — SUGAMMADEX SODIUM 200 MG/2ML IV SOLN
INTRAVENOUS | Status: DC | PRN
  Administered 2017-04-30: 250 mg via INTRAVENOUS

## 2017-04-30 MED ORDER — HYDROCHLOROTHIAZIDE 25 MG PO TABS: 50.0000 mg | ORAL_TABLET | ORAL | Status: DC

## 2017-04-30 MED ORDER — OXYCODONE HCL 5 MG PO TABS
ORAL_TABLET | ORAL | Status: AC
  Filled 2017-04-30: qty 2

## 2017-04-30 MED ORDER — HYDROMORPHONE HCL 1 MG/ML IJ SOLN
1.0000 mg | INTRAMUSCULAR | Status: DC | PRN
  Administered 2017-04-30 – 2017-05-01 (×5): 1 mg via INTRAVENOUS
  Filled 2017-04-30 (×3): qty 1

## 2017-04-30 MED ORDER — MIDAZOLAM HCL 2 MG/2ML IJ SOLN
INTRAMUSCULAR | Status: AC
  Filled 2017-04-30: qty 2

## 2017-04-30 MED ORDER — METOCLOPRAMIDE HCL 5 MG PO TABS: 5.0000 mg | ORAL_TABLET | Freq: Three times a day (TID) | ORAL | Status: DC | PRN

## 2017-04-30 MED ORDER — 0.9 % SODIUM CHLORIDE (POUR BTL) OPTIME
TOPICAL | Status: DC | PRN
  Administered 2017-04-30: 1000 mL

## 2017-04-30 MED ORDER — PROPOFOL 10 MG/ML IV BOLUS
INTRAVENOUS | Status: AC
  Filled 2017-04-30: qty 20

## 2017-04-30 MED ORDER — AMLODIPINE BESYLATE 10 MG PO TABS
10.0000 mg | ORAL_TABLET | Freq: Every day | ORAL | Status: DC
Start: 2017-05-01 — End: 2017-05-03
  Administered 2017-05-01 – 2017-05-02 (×2): 10 mg via ORAL
  Filled 2017-04-30 (×3): qty 1

## 2017-04-30 MED ORDER — WARFARIN VIDEO: Freq: Once | Status: AC

## 2017-04-30 MED ORDER — CHLORHEXIDINE GLUCONATE 4 % EX LIQD: 60.0000 mL | Freq: Once | CUTANEOUS | Status: DC

## 2017-04-30 MED ORDER — ONDANSETRON HCL 4 MG/2ML IJ SOLN
INTRAMUSCULAR | Status: DC | PRN
  Administered 2017-04-30: 4 mg via INTRAVENOUS

## 2017-04-30 MED ORDER — LORAZEPAM 0.5 MG PO TABS: 0.5000 mg | ORAL_TABLET | Freq: Four times a day (QID) | ORAL | Status: DC | PRN

## 2017-04-30 MED ORDER — PROPOFOL 10 MG/ML IV BOLUS
INTRAVENOUS | Status: DC | PRN
  Administered 2017-04-30: 20 mg via INTRAVENOUS
  Administered 2017-04-30: 150 mg via INTRAVENOUS

## 2017-04-30 MED ORDER — LIDOCAINE 2% (20 MG/ML) 5 ML SYRINGE
INTRAMUSCULAR | Status: DC | PRN
  Administered 2017-04-30: 100 mg via INTRAVENOUS

## 2017-04-30 MED ORDER — ROPIVACAINE HCL 5 MG/ML IJ SOLN
INTRAMUSCULAR | Status: DC | PRN
  Administered 2017-04-30: 150 mg via PERINEURAL

## 2017-04-30 MED ORDER — SODIUM CHLORIDE 0.9 % IV SOLN
INTRAVENOUS | Status: DC
  Administered 2017-04-30: 11:00:00 via INTRAVENOUS

## 2017-04-30 MED ORDER — TRANEXAMIC ACID 1000 MG/10ML IV SOLN
INTRAVENOUS | Status: DC | PRN
  Administered 2017-04-30: 1000 mg via INTRAVENOUS

## 2017-04-30 MED ORDER — ONDANSETRON HCL 4 MG PO TABS: 4.0000 mg | ORAL_TABLET | Freq: Four times a day (QID) | ORAL | Status: DC | PRN

## 2017-04-30 MED ORDER — ALUM & MAG HYDROXIDE-SIMETH 200-200-20 MG/5ML PO SUSP: 30.0000 mL | ORAL | Status: DC | PRN

## 2017-04-30 MED ORDER — WARFARIN SODIUM 7.5 MG PO TABS
7.5000 mg | ORAL_TABLET | Freq: Once | ORAL | Status: AC
  Administered 2017-04-30: 7.5 mg via ORAL
  Filled 2017-04-30: qty 1

## 2017-04-30 MED ORDER — ONDANSETRON HCL 4 MG/2ML IJ SOLN
INTRAMUSCULAR | Status: AC
  Filled 2017-04-30: qty 2

## 2017-04-30 MED ORDER — ACETAMINOPHEN 650 MG RE SUPP: 650.0000 mg | RECTAL | Status: DC | PRN

## 2017-04-30 MED ORDER — WARFARIN - PHARMACIST DOSING INPATIENT: Freq: Every day | Status: DC

## 2017-04-30 MED ORDER — CEFAZOLIN SODIUM-DEXTROSE 1-4 GM/50ML-% IV SOLN
1.0000 g | Freq: Four times a day (QID) | INTRAVENOUS | Status: AC
  Administered 2017-04-30 – 2017-05-01 (×2): 1 g via INTRAVENOUS
  Filled 2017-04-30 (×2): qty 50

## 2017-04-30 MED ORDER — DIPHENHYDRAMINE HCL 12.5 MG/5ML PO ELIX: 12.5000 mg | ORAL_SOLUTION | ORAL | Status: DC | PRN

## 2017-04-30 MED ORDER — ATORVASTATIN CALCIUM 40 MG PO TABS
40.0000 mg | ORAL_TABLET | Freq: Every day | ORAL | Status: DC
  Administered 2017-04-30 – 2017-05-03 (×4): 40 mg via ORAL
  Filled 2017-04-30 (×4): qty 1

## 2017-04-30 MED ORDER — PROMETHAZINE HCL 25 MG/ML IJ SOLN: 6.2500 mg | INTRAMUSCULAR | Status: DC | PRN

## 2017-04-30 MED ORDER — FENTANYL CITRATE (PF) 100 MCG/2ML IJ SOLN
25.0000 ug | INTRAMUSCULAR | Status: DC | PRN
  Administered 2017-04-30 (×3): 50 ug via INTRAVENOUS

## 2017-04-30 MED ORDER — DOCUSATE SODIUM 100 MG PO CAPS
100.0000 mg | ORAL_CAPSULE | Freq: Two times a day (BID) | ORAL | Status: DC
  Administered 2017-04-30 – 2017-05-03 (×6): 100 mg via ORAL
  Filled 2017-04-30 (×6): qty 1

## 2017-04-30 MED ORDER — GABAPENTIN 100 MG PO CAPS
100.0000 mg | ORAL_CAPSULE | Freq: Two times a day (BID) | ORAL | Status: DC
  Administered 2017-04-30 – 2017-05-03 (×6): 100 mg via ORAL
  Filled 2017-04-30 (×6): qty 1

## 2017-04-30 SURGICAL SUPPLY — 64 items
BANDAGE ACE 6X5 VEL STRL LF (GAUZE/BANDAGES/DRESSINGS) ×6 IMPLANT
BANDAGE ESMARK 6X9 LF (GAUZE/BANDAGES/DRESSINGS) ×1 IMPLANT
BLADE SAG 18X100X1.27 (BLADE) ×3 IMPLANT
BNDG CMPR 9X6 STRL LF SNTH (GAUZE/BANDAGES/DRESSINGS) ×1
BNDG ESMARK 6X9 LF (GAUZE/BANDAGES/DRESSINGS) ×3
BOWL SMART MIX CTS (DISPOSABLE) ×3 IMPLANT
CAPT KNEE TOTAL 3 ×2 IMPLANT
CEMENT BONE SIMPLEX SPEEDSET (Cement) ×6 IMPLANT
CLOSURE WOUND 1/2 X4 (GAUZE/BANDAGES/DRESSINGS)
COVER SURGICAL LIGHT HANDLE (MISCELLANEOUS) ×3 IMPLANT
CUFF TOURNIQUET SINGLE 34IN LL (TOURNIQUET CUFF) ×3 IMPLANT
DRAPE EXTREMITY T 121X128X90 (DRAPE) ×3 IMPLANT
DRAPE HALF SHEET 40X57 (DRAPES) ×3 IMPLANT
DRAPE U-SHAPE 47X51 STRL (DRAPES) ×3 IMPLANT
DRSG PAD ABDOMINAL 8X10 ST (GAUZE/BANDAGES/DRESSINGS) ×3 IMPLANT
DURAPREP 26ML APPLICATOR (WOUND CARE) ×3 IMPLANT
ELECT BLADE 4.0 EZ CLEAN MEGAD (MISCELLANEOUS) ×3
ELECT CAUTERY BLADE 6.4 (BLADE) ×3 IMPLANT
ELECT REM PT RETURN 9FT ADLT (ELECTROSURGICAL) ×3
ELECTRODE BLDE 4.0 EZ CLN MEGD (MISCELLANEOUS) IMPLANT
ELECTRODE REM PT RTRN 9FT ADLT (ELECTROSURGICAL) ×1 IMPLANT
FACESHIELD WRAPAROUND (MASK) ×9 IMPLANT
FACESHIELD WRAPAROUND OR TEAM (MASK) ×2 IMPLANT
GAUZE SPONGE 4X4 12PLY STRL (GAUZE/BANDAGES/DRESSINGS) ×3 IMPLANT
GAUZE XEROFORM 1X8 LF (GAUZE/BANDAGES/DRESSINGS) ×1 IMPLANT
GLOVE BIOGEL PI IND STRL 6.5 (GLOVE) IMPLANT
GLOVE BIOGEL PI IND STRL 8 (GLOVE) ×2 IMPLANT
GLOVE BIOGEL PI INDICATOR 6.5 (GLOVE) ×4
GLOVE BIOGEL PI INDICATOR 8 (GLOVE) ×4
GLOVE ORTHO TXT STRL SZ7.5 (GLOVE) ×5 IMPLANT
GLOVE SURG ORTHO 8.0 STRL STRW (GLOVE) ×3 IMPLANT
GLOVE SURG SS PI 6.5 STRL IVOR (GLOVE) ×4 IMPLANT
GOWN STRL REUS W/ TWL LRG LVL3 (GOWN DISPOSABLE) IMPLANT
GOWN STRL REUS W/ TWL XL LVL3 (GOWN DISPOSABLE) ×2 IMPLANT
GOWN STRL REUS W/TWL LRG LVL3 (GOWN DISPOSABLE) ×6
GOWN STRL REUS W/TWL XL LVL3 (GOWN DISPOSABLE) ×6
HANDPIECE INTERPULSE COAX TIP (DISPOSABLE) ×3
IMMOBILIZER KNEE 22 UNIV (SOFTGOODS) ×1 IMPLANT
KIT BASIN OR (CUSTOM PROCEDURE TRAY) ×3 IMPLANT
KIT ROOM TURNOVER OR (KITS) ×3 IMPLANT
MANIFOLD NEPTUNE II (INSTRUMENTS) ×3 IMPLANT
NDL SAFETY ECLIPSE 18X1.5 (NEEDLE) IMPLANT
NEEDLE HYPO 18GX1.5 SHARP (NEEDLE)
NS IRRIG 1000ML POUR BTL (IV SOLUTION) ×3 IMPLANT
PACK TOTAL JOINT (CUSTOM PROCEDURE TRAY) ×3 IMPLANT
PAD ARMBOARD 7.5X6 YLW CONV (MISCELLANEOUS) ×3 IMPLANT
PADDING CAST COTTON 6X4 STRL (CAST SUPPLIES) ×3 IMPLANT
SET HNDPC FAN SPRY TIP SCT (DISPOSABLE) ×1 IMPLANT
SET PAD KNEE POSITIONER (MISCELLANEOUS) ×3 IMPLANT
STAPLER VISISTAT 35W (STAPLE) IMPLANT
STRIP CLOSURE SKIN 1/2X4 (GAUZE/BANDAGES/DRESSINGS) IMPLANT
SUCTION FRAZIER HANDLE 10FR (MISCELLANEOUS) ×2
SUCTION TUBE FRAZIER 10FR DISP (MISCELLANEOUS) ×1 IMPLANT
SUT MNCRL AB 4-0 PS2 18 (SUTURE) IMPLANT
SUT VIC AB 0 CT1 27 (SUTURE) ×3
SUT VIC AB 0 CT1 27XBRD ANBCTR (SUTURE) ×1 IMPLANT
SUT VIC AB 1 CT1 27 (SUTURE) ×9
SUT VIC AB 1 CT1 27XBRD ANBCTR (SUTURE) ×2 IMPLANT
SUT VIC AB 2-0 CT1 27 (SUTURE) ×6
SUT VIC AB 2-0 CT1 TAPERPNT 27 (SUTURE) ×2 IMPLANT
SYR 50ML LL SCALE MARK (SYRINGE) IMPLANT
TOWEL OR 17X24 6PK STRL BLUE (TOWEL DISPOSABLE) ×3 IMPLANT
TOWEL OR 17X26 10 PK STRL BLUE (TOWEL DISPOSABLE) ×3 IMPLANT
WRAP KNEE MAXI GEL POST OP (GAUZE/BANDAGES/DRESSINGS) ×3 IMPLANT

## 2017-04-30 NOTE — Brief Op Note (Signed)
04/30/2017  1:08 PM  PATIENT:  Golden Hurter Dannemiller  66 y.o. female  PRE-OPERATIVE DIAGNOSIS:  osteoarthritis right knee  POST-OPERATIVE DIAGNOSIS:  osteoarthritis right knee  PROCEDURE:  Procedure(s): RIGHT TOTAL KNEE ARTHROPLASTY (Right)  SURGEON:  Surgeon(s) and Role:    Mcarthur Rossetti, MD - Primary  PHYSICIAN ASSISTANT: Benita Stabile, PA-C  ANESTHESIA:   regional and general  EBL:  150 mL   COUNTS:  YES  TOURNIQUET:   Total Tourniquet Time Documented: Thigh (Right) - 52 minutes Total: Thigh (Right) - 52 minutes   DICTATION: .Other Dictation: Dictation Number [id number]  PLAN OF CARE: Admit to inpatient   PATIENT DISPOSITION:  PACU - hemodynamically stable.   Delay start of Pharmacological VTE agent (>24hrs) due to surgical blood loss or risk of bleeding: no

## 2017-04-30 NOTE — H&P (Signed)
TOTAL KNEE ADMISSION H&P  Patient is being admitted for right total knee arthroplasty.  Subjective:  Chief Complaint:right knee pain.  HPI: Carrie [name], 66 y.o. female, has a history of pain and functional disability in the right knee due to arthritis and has failed non-surgical conservative treatments for greater than 12 weeks to includeNSAID's and/or analgesics, corticosteriod injections, viscosupplementation injections, flexibility and strengthening excercises, use of assistive devices, weight reduction as appropriate and activity modification.  Onset of symptoms was gradual, starting 3 years ago with gradually worsening course since that time. The patient noted no past surgery on the right knee(s).  Patient currently rates pain in the right knee(s) at 10 out of 10 with activity. Patient has night pain, worsening of pain with activity and weight bearing, pain that interferes with activities of daily living, pain with passive range of motion, crepitus and joint swelling.  Patient has evidence of subchondral sclerosis, periarticular osteophytes, joint space narrowing and loose bodies by imaging studies. There is no active infection.  Patient Active Problem List   Diagnosis Date Noted  . Unilateral primary osteoarthritis, right knee 03/18/2017  . Acute pain of right knee 03/18/2017  . History of total hip replacement, left 03/18/2017  . Cervical radiculopathy 07/25/2016  . Chronic kidney disease, stage III (moderate) ([place]) 05/07/2016  . Unilateral primary osteoarthritis, left hip 03/27/2016  . Status post total replacement of left hip 03/27/2016  . Left hip pain 03/05/2016  . Mood disorder ([place]) 04/01/2015  . Dental infection 02/03/2014  . Osteoarthritis of right knee 02/03/2014  . Neck pain 02/03/2014  . Advanced directives, counseling/discussion 02/03/2014  . Routine general medical examination at a health care facility 07/08/2012  . Obesity, morbid, BMI 40.0-49.9 ([place]) 07/08/2012   . Type 2 diabetes, controlled, with renal manifestation ([place])   . COMMON MIGRAINE 09/27/2006  . HYPERCHOLESTEROLEMIA 09/26/2006  . Essential hypertension, benign 09/26/2006  . ALLERGIC RHINITIS 09/26/2006  . MENINGIOMA 08/03/2006   Past Medical History:  Diagnosis Date  . Allergy   . Arthritis    "right knee" (10/26'/2017)  . CKD (chronic kidney disease), stage III ([place])    12/2015 (Dr. Lavonia Dana)  . Depression   . History of blood transfusion 2008   "when I had brain tumor surgery"  . History of MRSA infection 2008  . Hypertension   . Meningioma ([place])    Foramen magnum  . Type II diabetes mellitus ([place]) 2011   on metformin until yr ago- no med now - off due to kidneys    Past Surgical History:  Procedure Laterality Date  . BRAIN MENINGIOMA EXCISION  08/2006   Foramina magnum meningioma  . CERVICAL DISCECTOMY  1996   Dr. Alric Seton  . CESAREAN SECTION  1981  . COLONOSCOPY    . DILATION AND CURETTAGE OF UTERUS    . JOINT REPLACEMENT    . Right wrist x-ray  2007   Deg. at 1st MCP  . TOTAL HIP ARTHROPLASTY Left 03/27/2016   Procedure: LEFT TOTAL HIP ARTHROPLASTY ANTERIOR APPROACH;  Surgeon: Mcarthur Rossetti, MD;  Location: [facility];  Service: Orthopedics;  Laterality: Left;  . TUBAL LIGATION  1981    Current Facility-Administered Medications  Medication Dose Route Frequency Provider Last Rate Last Dose  . 0.9 %  sodium chloride infusion   Intravenous Continuous Duane Boston, MD      . ceFAZolin (ANCEF) 3 g in dextrose 5 % 50 mL IVPB  3 g Intravenous To SS-Surg Mcarthur Rossetti, MD      .  chlorhexidine (HIBICLENS) 4 % liquid 4 application  60 mL Topical Once Pete Pelt, PA-C       Facility-Administered Medications Ordered in Other Encounters  Medication Dose Route Frequency Provider Last Rate Last Dose  . ropivacaine (PF) 5 mg/mL (0.5%) (NAROPIN) injection    Anesthesia Intra-op Duane Boston, MD   150 mg at 04/30/17 1038   Allergies  Allergen  Reactions  . Naproxen Sodium Anaphylaxis and Swelling  . Sulfamethoxazole-Trimethoprim Anaphylaxis and Swelling    Social History   Tobacco Use  . Smoking status: Never Smoker  . Smokeless tobacco: Never Used  Substance Use Topics  . Alcohol use: No    Alcohol/week: 0.0 oz    Family History  Problem Relation Age of Onset  . Hypertension Other        Strong family history   . Breast cancer Mother 34     Review of Systems  Musculoskeletal: Positive for joint pain.  All other systems reviewed and are negative.   Objective:  Physical Exam  Constitutional: She is oriented to person, place, and time. She appears well-developed and well-nourished.  HENT:  Head: Normocephalic and atraumatic.  Eyes: EOM are normal. Pupils are equal, round, and reactive to light.  Neck: Normal range of motion. Neck supple.  Cardiovascular: Normal rate and regular rhythm.  Respiratory: Effort normal and breath sounds normal.  GI: Soft. Bowel sounds are normal.  Musculoskeletal:       Right knee: She exhibits decreased range of motion, swelling, abnormal alignment and bony tenderness. Tenderness found. Medial joint line and lateral joint line tenderness noted.  Neurological: She is alert and oriented to person, place, and time.  Skin: Skin is warm and dry.  Psychiatric: She has a normal mood and affect.    Vital signs in last 24 hours: Temp:  [98.4 F (36.9 C)] 98.4 F (36.9 C) (11/27 0910) Pulse Rate:  [91-100] 92 (11/27 1040) Resp:  [18] 18 (11/27 0910) BP: (146-154)/(79-84) 146/79 (11/27 1040) SpO2:  [98 %-100 %] 100 % (11/27 1040) Weight:  [260 lb 4.8 oz (118.1 kg)] 260 lb 4.8 oz (118.1 kg) (11/27 0910)  Labs:   Estimated body mass index is 46.11 kg/m as calculated from the following:   Height as of this encounter: 5\' 3"  (1.6 m).   Weight as of this encounter: 260 lb 4.8 oz (118.1 kg).   Imaging Review Plain radiographs demonstrate severe degenerative joint disease of the right  knee(s). The overall alignment ismild varus. The bone quality appears to be good for age and reported activity level.  Assessment/Plan:  End stage arthritis, right knee   The patient history, physical examination, clinical judgment of the provider and imaging studies are consistent with end stage degenerative joint disease of the right knee(s) and total knee arthroplasty is deemed medically necessary. The treatment options including medical management, injection therapy arthroscopy and arthroplasty were discussed at length. The risks and benefits of total knee arthroplasty were presented and reviewed. The risks due to aseptic loosening, infection, stiffness, patella tracking problems, thromboembolic complications and other imponderables were discussed. The patient acknowledged the explanation, agreed to proceed with the plan and consent was signed. Patient is being admitted for inpatient treatment for surgery, pain control, PT, OT, prophylactic antibiotics, VTE prophylaxis, progressive ambulation and ADL's and discharge planning. The patient is planning to be discharged to skilled nursing facility

## 2017-04-30 NOTE — Anesthesia Procedure Notes (Signed)
Anesthesia Regional Block: Adductor canal block   Pre-Anesthetic Checklist: ,, timeout performed, Correct Patient, Correct Site, Correct Laterality, Correct Procedure, Correct Position, site marked, Risks and benefits discussed,  Surgical consent,  Pre-op evaluation,  At surgeon's request and post-op pain management  Laterality: Right  Prep: chloraprep       Needles:  Injection technique: Single-shot  Needle Type: Stimulator Needle - 80     Needle Length: 10cm  Needle Gauge: 21     Additional Needles:   Narrative:  Start time: 04/30/2017 10:29 AM End time: 04/30/2017 10:39 AM Injection made incrementally with aspirations every 5 mL.  Performed by: Personally

## 2017-04-30 NOTE — Progress Notes (Signed)
Orthopedic Tech Progress Note Patient Details:  Carrie [name] 1951/05/01 [id number]  CPM Right Knee CPM Right Knee: On Right Knee Flexion (Degrees): 60 Right Knee Extension (Degrees): 0   Braulio Bosch 04/30/2017, 2:49 PM

## 2017-04-30 NOTE — Anesthesia Postprocedure Evaluation (Signed)
Anesthesia Post Note  Patient: Carrie [name]  Procedure(s) Performed: RIGHT TOTAL KNEE ARTHROPLASTY (Right Knee)     Patient location during evaluation: PACU Anesthesia Type: General Level of consciousness: sedated Pain management: pain level controlled Vital Signs Assessment: post-procedure vital signs reviewed and stable Respiratory status: spontaneous breathing and respiratory function stable Cardiovascular status: stable Postop Assessment: no apparent nausea or vomiting Anesthetic complications: no    Last Vitals:  Vitals:   04/30/17 1405 04/30/17 1420  BP: 116/68 133/60  Pulse: 80 86  Resp: 15 14  Temp:    SpO2: 93% 98%    Last Pain:  Vitals:   04/30/17 1415  TempSrc:   PainSc: Asleep                 Durante Violett DANIEL

## 2017-04-30 NOTE — Progress Notes (Signed)
ANTICOAGULATION CONSULT NOTE - Initial Consult  Pharmacy Consult for Coumadin Indication: VTE prophylaxis  Allergies  Allergen Reactions  . Naproxen Sodium Anaphylaxis and Swelling  . Sulfamethoxazole-Trimethoprim Anaphylaxis and Swelling    Patient Measurements: Height: 5\' 3"  (160 cm) Weight: 260 lb 4.8 oz (118.1 kg) IBW/kg (Calculated) : 52.4  Vital Signs: Temp: 97.2 F (36.2 C) (11/27 1815) Temp Source: Oral (11/27 1815) BP: 137/79 (11/27 1815) Pulse Rate: 99 (11/27 1815)  Labs: Recent Labs    04/30/17 0907  CREATININE 2.38*    Estimated Creatinine Clearance: 28.1 mL/min (A) (by C-G formula based on SCr of 2.38 mg/dL (H)).   Medical History: Past Medical History:  Diagnosis Date  . Allergy   . Arthritis    "right knee" (10/26'/2017)  . CKD (chronic kidney disease), stage III ([place])    12/2015 (Dr. Lavonia Dana)  . Depression   . History of blood transfusion 2008   "when I had brain tumor surgery"  . History of MRSA infection 2008  . Hypertension   . Meningioma ([place])    Foramen magnum  . Type II diabetes mellitus ([place]) 2011   on metformin until yr ago- no med now - off due to kidneys   Assessment:   66 yr old female to begin Coumadin for VTE prophylaxis s/p right TKA.   No anticoagulation prior to admission.   She reports some familiarity with Coumadin d/t her husband and sister took Coumadin.  Goal of Therapy:  INR 2-3 Monitor platelets by anticoagulation protocol: Yes   Plan:   Coumadin 7.5 mg x 1 tonight.  Daily PT/INR.  Coumadin education prior to discharge.   Of note:  PTA med list erroneously listed HCTZ 50 mg daily, which was ordered post-op and which I discontinued.  Per 11/19 pre-op note, intent was to change from Losartan-HCTZ 50-12.5 mg daily to Losartan 50 mg daily.  HCTZ was to be discontinued due to worsening renal function.  Confirmed with patient and with [facility]. Losartan 50 mg Rx filled on 04/29/17; has not been picked up  yet.   Begin Losartan 50 mg daily on 05/01/17?  Arty Baumgartner, [place] Pager: [phone] 04/30/2017,7:04 PM

## 2017-04-30 NOTE — Transfer of Care (Signed)
Immediate Anesthesia Transfer of Care Note  Patient: Carrie [name]  Procedure(s) Performed: RIGHT TOTAL KNEE ARTHROPLASTY (Right Knee)  Patient Location: PACU  Anesthesia Type:General and GA combined with regional for post-op pain  Level of Consciousness: awake, alert , oriented and patient cooperative  Airway & Oxygen Therapy: Patient Spontanous Breathing and Patient connected to nasal cannula oxygen  Post-op Assessment: Report given to RN, Post -op Vital signs reviewed and stable and Patient moving all extremities X 4  Post vital signs: Reviewed and stable  Last Vitals:  Vitals:   04/30/17 1030 04/30/17 1040  BP:  (!) 146/79  Pulse: 100 92  Resp:    Temp:    SpO2: 100% 100%    Last Pain:  Vitals:   04/30/17 1040  TempSrc:   PainSc: 0-No pain         Complications: No apparent anesthesia complications

## 2017-04-30 NOTE — Anesthesia Procedure Notes (Signed)
Procedure Name: Intubation Date/Time: 04/30/2017 11:32 AM Performed by: Julieta Bellini, CRNA Pre-anesthesia Checklist: Patient identified, Emergency Drugs available, Suction available and Patient being monitored Patient Re-evaluated:Patient Re-evaluated prior to induction Oxygen Delivery Method: Circle system utilized Preoxygenation: Pre-oxygenation with 100% oxygen Induction Type: IV induction Ventilation: Mask ventilation without difficulty and Oral airway inserted - appropriate to patient size Laryngoscope Size: Mac and 3 Grade View: Grade II Tube type: Oral Tube size: 7.0 mm Number of attempts: 1 Airway Equipment and Method: Stylet Placement Confirmation: ETT inserted through vocal cords under direct vision,  positive ETCO2 and breath sounds checked- equal and bilateral Secured at: 22 cm Tube secured with: Tape Dental Injury: Teeth and Oropharynx as per pre-operative assessment

## 2017-04-30 NOTE — Anesthesia Preprocedure Evaluation (Addendum)
Anesthesia Evaluation  Patient identified by MRN, date of birth, ID band Patient awake    Reviewed: Allergy & Precautions, NPO status , Patient's Chart, lab work & pertinent test results  History of Anesthesia Complications Negative for: history of anesthetic complications  Airway Mallampati: III  TM Distance: >3 FB Neck ROM: Full    Dental  (+) Dental Advisory Given   Pulmonary neg pulmonary ROS,    Pulmonary exam normal        Cardiovascular hypertension, Pt. on medications Normal cardiovascular exam     Neuro/Psych  Headaches, PSYCHIATRIC DISORDERS Depression    GI/Hepatic negative GI ROS, Neg liver ROS,   Endo/Other  diabetesMorbid obesity  Renal/GU Renal InsufficiencyRenal disease     Musculoskeletal   Abdominal   Peds  Hematology   Anesthesia Other Findings   Reproductive/Obstetrics                            Anesthesia Physical Anesthesia Plan  ASA: III  Anesthesia Plan: General   Post-op Pain Management: GA combined w/ Regional for post-op pain   Induction: Intravenous  PONV Risk Score and Plan: 2 and Ondansetron, Propofol infusion and Dexamethasone  Airway Management Planned: Oral ETT  Additional Equipment:   Intra-op Plan:   Post-operative Plan: Extubation in OR  Informed Consent: I have reviewed the patients History and Physical, chart, labs and discussed the procedure including the risks, benefits and alternatives for the proposed anesthesia with the patient or authorized representative who has indicated his/her understanding and acceptance.   Dental advisory given  Plan Discussed with: CRNA, Anesthesiologist and Surgeon  Anesthesia Plan Comments: (Pt refuses SAB.)       Anesthesia Quick Evaluation

## 2017-05-01 ENCOUNTER — Encounter (HOSPITAL_COMMUNITY): Payer: Self-pay | Admitting: Orthopaedic Surgery

## 2017-05-01 ENCOUNTER — Other Ambulatory Visit: Payer: Self-pay | Admitting: *Deleted

## 2017-05-01 LAB — CBC
HCT: 29.6 % — ABNORMAL LOW (ref 36.0–46.0)
HEMOGLOBIN: 9.1 g/dL — AB (ref 12.0–15.0)
MCH: 26.4 pg (ref 26.0–34.0)
MCHC: 30.7 g/dL (ref 30.0–36.0)
MCV: 85.8 fL (ref 78.0–100.0)
PLATELETS: 231 10*3/uL (ref 150–400)
RBC: 3.45 MIL/uL — ABNORMAL LOW (ref 3.87–5.11)
RDW: 15.6 % — AB (ref 11.5–15.5)
WBC: 11.6 10*3/uL — ABNORMAL HIGH (ref 4.0–10.5)

## 2017-05-01 LAB — BASIC METABOLIC PANEL
Anion gap: 7 (ref 5–15)
BUN: 41 mg/dL — AB (ref 6–20)
CALCIUM: 8.5 mg/dL — AB (ref 8.9–10.3)
CHLORIDE: 109 mmol/L (ref 101–111)
CO2: 24 mmol/L (ref 22–32)
CREATININE: 2.49 mg/dL — AB (ref 0.44–1.00)
GFR calc non Af Amer: 19 mL/min — ABNORMAL LOW (ref >60)
GFR, EST AFRICAN AMERICAN: 22 mL/min — AB (ref >60)
Glucose, Bld: 137 mg/dL — ABNORMAL HIGH (ref 65–99)
Potassium: 4.7 mmol/L (ref 3.5–5.1)
SODIUM: 140 mmol/L (ref 135–145)

## 2017-05-01 LAB — PROTIME-INR
INR: 1.12
PROTHROMBIN TIME: 14.3 s (ref 11.4–15.2)

## 2017-05-01 MED ORDER — WARFARIN SODIUM 7.5 MG PO TABS
7.5000 mg | ORAL_TABLET | Freq: Once | ORAL | Status: AC
  Administered 2017-05-01: 7.5 mg via ORAL
  Filled 2017-05-01: qty 1

## 2017-05-01 NOTE — Telephone Encounter (Signed)
Last Rx 01/03/2017 #180 1R. Last OV 11/2016

## 2017-05-01 NOTE — Progress Notes (Signed)
[place] for Coumadin Indication: VTE prophylaxis  Allergies  Allergen Reactions  . Naproxen Sodium Anaphylaxis and Swelling  . Sulfamethoxazole-Trimethoprim Anaphylaxis and Swelling    Patient Measurements: Height: 5\' 3"  (160 cm) Weight: 260 lb 4.8 oz (118.1 kg) IBW/kg (Calculated) : 52.4  Vital Signs:    Labs: Recent Labs    04/30/17 0907 05/01/17 0604  HGB  --  9.1*  HCT  --  29.6*  PLT  --  231  LABPROT  --  14.3  INR  --  1.12  CREATININE 2.38* 2.49*    Estimated Creatinine Clearance: 26.9 mL/min (A) (by C-G formula based on SCr of 2.49 mg/dL (H)).   Medical History: Past Medical History:  Diagnosis Date  . Allergy   . Arthritis    "right knee" (10/26'/2017)  . CKD (chronic kidney disease), stage III ([place])    12/2015 (Dr. Lavonia Dana)  . Depression   . History of blood transfusion 2008   "when I had brain tumor surgery"  . History of MRSA infection 2008  . Hypertension   . 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 No anticoagulation prior to admission. INR 1.12, Hg 9.1, plt wnl. No bleed documented.  Goal of Therapy:  INR 2-3 Monitor platelets by anticoagulation protocol: Yes   Plan:  Coumadin 7.5 mg x 1 tonight. Daily PT/INR. Monitor CBC, s/sx bleeding   Elicia Lamp, PharmD, BCPS Clinical Pharmacist Rx Phone # for today: [phone] After 3:30PM, please call Main Rx: [phone] 05/01/2017 10:41 AM

## 2017-05-01 NOTE — Discharge Instructions (Addendum)
Information on my medicine - Coumadin   (Warfarin)  This medication education was reviewed with me or my healthcare representative as part of my discharge preparation.  Why was Coumadin prescribed for you? Coumadin was prescribed for you because you have a blood clot or a medical condition that can cause an increased risk of forming blood clots. Blood clots can cause serious health problems by blocking the flow of blood to the heart, lung, or brain. Coumadin can prevent harmful blood clots from forming. As a reminder your indication for Coumadin is:   Blood Clot Prevention After Orthopedic Surgery  What test will check on my response to Coumadin? While on Coumadin (warfarin) you will need to have an INR test regularly to ensure that your dose is keeping you in the desired range. The INR (international normalized ratio) number is calculated from the result of the laboratory test called prothrombin time (PT).  If an INR APPOINTMENT HAS NOT ALREADY BEEN MADE FOR YOU please schedule an appointment to have this lab work done by your health care provider within 7 days. Your INR goal is usually a number between:  2 to 3 or your provider may give you a more narrow range like 2-2.5.  Ask your health care provider during an office visit what your goal INR is.  What  do you need to  know  About  COUMADIN? Take Coumadin (warfarin) exactly as prescribed by your healthcare provider about the same time each day.  DO NOT stop taking without talking to the doctor who prescribed the medication.  Stopping without other blood clot prevention medication to take the place of Coumadin may increase your risk of developing a new clot or stroke.  Get refills before you run out.  What do you do if you miss a dose? If you miss a dose, take it as soon as you remember on the same day then continue your regularly scheduled regimen the next day.  Do not take two doses of Coumadin at the same time.  Important Safety  Information A possible side effect of Coumadin (Warfarin) is an increased risk of bleeding. You should call your healthcare provider right away if you experience any of the following: ? Bleeding from an injury or your nose that does not stop. ? Unusual colored urine (red or dark brown) or unusual colored stools (red or black). ? Unusual bruising for unknown reasons. ? A serious fall or if you hit your head (even if there is no bleeding).  Some foods or medicines interact with Coumadin (warfarin) and might alter your response to warfarin. To help avoid this: ? Eat a balanced diet, maintaining a consistent amount of Vitamin K. ? Notify your provider about major diet changes you plan to make. ? Avoid alcohol or limit your intake to 1 drink for women and 2 drinks for men per day. (1 drink is 5 oz. wine, 12 oz. beer, or 1.5 oz. liquor.)  Make sure that ANY health care provider who prescribes medication for you knows that you are taking Coumadin (warfarin).  Also make sure the healthcare provider who is monitoring your Coumadin knows when you have started a new medication including herbals and non-prescription products.  Coumadin (Warfarin)  Major Drug Interactions  Increased Warfarin Effect Decreased Warfarin Effect  Alcohol (large quantities) Antibiotics (esp. Septra/Bactrim, Flagyl, Cipro) Amiodarone (Cordarone) Aspirin (ASA) Cimetidine (Tagamet) Megestrol (Megace) NSAIDs (ibuprofen, naproxen, etc.) Piroxicam (Feldene) Propafenone (Rythmol SR) Propranolol (Inderal) Isoniazid (INH) Posaconazole (Noxafil) Barbiturates (Phenobarbital) Carbamazepine (Tegretol)  Chlordiazepoxide (Librium) Cholestyramine (Questran) Griseofulvin Oral Contraceptives Rifampin Sucralfate (Carafate) Vitamin K   Coumadin (Warfarin) Major Herbal Interactions  Increased Warfarin Effect Decreased Warfarin Effect  Garlic Ginseng Ginkgo biloba Coenzyme Q10 Green tea St. Johns wort    Coumadin (Warfarin)  FOOD Interactions  Eat a consistent number of servings per week of foods HIGH in Vitamin K (1 serving =  cup)  Collards (cooked, or boiled & drained) Kale (cooked, or boiled & drained) Mustard greens (cooked, or boiled & drained) Parsley *serving size only =  cup Spinach (cooked, or boiled & drained) Swiss chard (cooked, or boiled & drained) Turnip greens (cooked, or boiled & drained)  Eat a consistent number of servings per week of foods MEDIUM-HIGH in Vitamin K (1 serving = 1 cup)  Asparagus (cooked, or boiled & drained) Broccoli (cooked, boiled & drained, or raw & chopped) Brussel sprouts (cooked, or boiled & drained) *serving size only =  cup Lettuce, raw (green leaf, endive, romaine) Spinach, raw Turnip greens, raw & chopped   These websites have more information on Coumadin (warfarin):  FailFactory.se; VeganReport.com.au;   INSTRUCTIONS AFTER JOINT REPLACEMENT   o Remove items at home which could result in a fall. This includes throw rugs or furniture in walking pathways o ICE to the affected joint every three hours while awake for 30 minutes at a time, for at least the first 3-5 days, and then as needed for pain and swelling.  Continue to use ice for pain and swelling. You may notice swelling that will progress down to the foot and ankle.  This is normal after surgery.  Elevate your leg when you are not up walking on it.   o Continue to use the breathing machine you got in the hospital (incentive spirometer) which will help keep your temperature down.  It is common for your temperature to cycle up and down following surgery, especially at night when you are not up moving around and exerting yourself.  The breathing machine keeps your lungs expanded and your temperature down.   DIET:  As you were doing prior to hospitalization, we recommend a well-balanced diet.  DRESSING / WOUND CARE / SHOWERING  Keep the surgical dressing until follow up.  The dressing  is water proof, so you can shower without any extra covering.  IF THE DRESSING FALLS OFF or the wound gets wet inside, change the dressing with sterile gauze.  Please use good hand washing techniques before changing the dressing.  Do not use any lotions or creams on the incision until instructed by your surgeon.    ACTIVITY  o Increase activity slowly as tolerated, but follow the weight bearing instructions below.   o No driving for 6 weeks or until further direction given by your physician.  You cannot drive while taking narcotics.  o No lifting or carrying greater than 10 lbs. until further directed by your surgeon. o Avoid periods of inactivity such as sitting longer than an hour when not asleep. This helps prevent blood clots.  o You may return to work once you are authorized by your doctor.     WEIGHT BEARING   Weight bearing as tolerated with assist device (walker, cane, etc) as directed, use it as long as suggested by your surgeon or therapist, typically at least 4-6 weeks.   EXERCISES  Results after joint replacement surgery are often greatly improved when you follow the exercise, range of motion and muscle strengthening exercises prescribed by your doctor. Safety measures are  also important to protect the joint from further injury. Any time any of these exercises cause you to have increased pain or swelling, decrease what you are doing until you are comfortable again and then slowly increase them. If you have problems or questions, call your caregiver or physical therapist for advice.   Rehabilitation is important following a joint replacement. After just a few days of immobilization, the muscles of the leg can become weakened and shrink (atrophy).  These exercises are designed to build up the tone and strength of the thigh and leg muscles and to improve motion. Often times heat used for twenty to thirty minutes before working out will loosen up your tissues and help with improving the  range of motion but do not use heat for the first two weeks following surgery (sometimes heat can increase post-operative swelling).   These exercises can be done on a training (exercise) mat, on the floor, on a table or on a bed. Use whatever works the best and is most comfortable for you.    Use music or television while you are exercising so that the exercises are a pleasant break in your day. This will make your life better with the exercises acting as a break in your routine that you can look forward to.   Perform all exercises about fifteen times, three times per day or as directed.  You should exercise both the operative leg and the other leg as well.  Exercises include:    Quad Sets - Tighten up the muscle on the front of the thigh (Quad) and hold for 5-10 seconds.    Straight Leg Raises - With your knee straight (if you were given a brace, keep it on), lift the leg to 60 degrees, hold for 3 seconds, and slowly lower the leg.  Perform this exercise against resistance later as your leg gets stronger.   Leg Slides: Lying on your back, slowly slide your foot toward your buttocks, bending your knee up off the floor (only go as far as is comfortable). Then slowly slide your foot back down until your leg is flat on the floor again.   Angel Wings: Lying on your back spread your legs to the side as far apart as you can without causing discomfort.   Hamstring Strength:  Lying on your back, push your heel against the floor with your leg straight by tightening up the muscles of your buttocks.  Repeat, but this time bend your knee to a comfortable angle, and push your heel against the floor.  You may put a pillow under the heel to make it more comfortable if necessary.   A rehabilitation program following joint replacement surgery can speed recovery and prevent re-injury in the future due to weakened muscles. Contact your doctor or a physical therapist for more information on knee rehabilitation.     CONSTIPATION  Constipation is defined medically as fewer than three stools per week and severe constipation as less than one stool per week.  Even if you have a regular bowel pattern at home, your normal regimen is likely to be disrupted due to multiple reasons following surgery.  Combination of anesthesia, postoperative narcotics, change in appetite and fluid intake all can affect your bowels.   YOU MUST use at least one of the following options; they are listed in order of increasing strength to get the job done.  They are all available over the counter, and you may need to use some, POSSIBLY even all  of these options:    Drink plenty of fluids (prune juice may be helpful) and high fiber foods Colace 100 mg by mouth twice a day  Senokot for constipation as directed and as needed Dulcolax (bisacodyl), take with full glass of water  Miralax (polyethylene glycol) once or twice a day as needed.  If you have tried all these things and are unable to have a bowel movement in the first 3-4 days after surgery call either your surgeon or your primary doctor.    If you experience loose stools or diarrhea, hold the medications until you stool forms back up.  If your symptoms do not get better within 1 week or if they get worse, check with your doctor.  If you experience "the worst abdominal pain ever" or develop nausea or vomiting, please contact the office immediately for further recommendations for treatment.   ITCHING:  If you experience itching with your medications, try taking only a single pain pill, or even half a pain pill at a time.  You can also use Benadryl over the counter for itching or also to help with sleep.   TED HOSE STOCKINGS:  Use stockings on both legs until for at least 2 weeks or as directed by physician office. They may be removed at night for sleeping.  MEDICATIONS:  See your medication summary on the After Visit Summary that nursing will review with you.  You may have some  home medications which will be placed on hold until you complete the course of blood thinner medication.  It is important for you to complete the blood thinner medication as prescribed.  PRECAUTIONS:  If you experience chest pain or shortness of breath - call 911 immediately for transfer to the hospital emergency department.   If you develop a fever greater that 101 F, purulent drainage from wound, increased redness or drainage from wound, foul odor from the wound/dressing, or calf pain - CONTACT YOUR SURGEON.                                                   FOLLOW-UP APPOINTMENTS:  If you do not already have a post-op appointment, please call the office for an appointment to be seen by your surgeon.  Guidelines for how soon to be seen are listed in your After Visit Summary, but are typically between 1-4 weeks after surgery.  OTHER INSTRUCTIONS:   Knee Replacement:  Do not place pillow under knee, focus on keeping the knee straight while resting. CPM instructions: 0-90 degrees, 2 hours in the morning, 2 hours in the afternoon, and 2 hours in the evening. Place foam block, curve side up under heel at all times except when in CPM or when walking.  DO NOT modify, tear, cut, or change the foam block in any way.  MAKE SURE YOU:   Understand these instructions.   Get help right away if you are not doing well or get worse.    Thank you for letting us be a part of your medical care team.  It is a privilege we respect greatly.  We hope these instructions will help you stay on track for a fast and full recovery!

## 2017-05-01 NOTE — Evaluation (Signed)
Physical Therapy Evaluation Patient Details Name: Carrie [name] MRN: [id number] DOB: 1951-01-12 Today's Date: 05/01/2017   History of Present Illness  66 y/o female s/p R TKA (04/30/17). PMH includes meningioma (s/p brain surgery), CKD, HTN, DMtype 2  Clinical Impression  Pt admitted with above diagnosis. Pt currently with functional limitations due to the deficits listed below (see PT Problem List).  Pt will benefit from skilled PT to increase their independence and safety with mobility to allow discharge to the venue listed below.  Pt moving well with bed mobility and transfers despite 10/10 pain, but was unable to attempt ambulation.  Her ROM was very limited due to pain.  Will continue to work towards improving ROM, strength, and mobility.  Recommend short term SNF and pt is in agreement.     Follow Up Recommendations SNF;Supervision/Assistance - 24 hour    Equipment Recommendations  None recommended by PT    Recommendations for Other Services       Precautions / Restrictions Precautions Precautions: Fall;Knee Restrictions RLE Weight Bearing: Weight bearing as tolerated      Mobility  Bed Mobility Overal bed mobility: Needs Assistance Bed Mobility: Supine to Sit     Supine to sit: Min assist     General bed mobility comments: MIN A for R LE only with Pt able to get trunk upright with HOB elevated  Transfers Overall transfer level: Needs assistance Equipment used: Rolling walker (2 wheeled) Transfers: Sit to/from [facility] Sit to Stand: Min assist;+2 safety/equipment Stand pivot transfers: Min assist;+2 safety/equipment       General transfer comment: Pt needed cues for hand placement and for use of UE to unweight R LE.  Pt able to take steps backwards with cueing.  bed > BSC > recliner  Ambulation/Gait             General Gait Details: SPTs only. Gait deferred due to pain  Stairs            Wheelchair Mobility    Modified  Rankin (Stroke Patients Only)       Balance Overall balance assessment: Needs assistance   Sitting balance-Leahy Scale: Good     Standing balance support: Bilateral upper extremity supported Standing balance-Leahy Scale: Poor Standing balance comment: requires UE support                             Pertinent Vitals/Pain Pain Assessment: 0-10 Pain Score: 10-Worst pain ever Pain Descriptors / Indicators: Throbbing Pain Intervention(s): Premedicated before session;Patient requesting pain meds-RN notified;Ice applied;RN gave pain meds during session    [place] expects to be discharged to:: Skilled nursing facility Living Arrangements: Other relatives               Additional Comments: Pt has a RW, cane, BSC    Prior Function Level of Independence: Independent with assistive device(s)         Comments: Amb with cane     Hand Dominance   Dominant Hand: Right    Extremity/Trunk Assessment   Upper Extremity Assessment Upper Extremity Assessment: Defer to OT evaluation    Lower Extremity Assessment Lower Extremity Assessment: RLE deficits/detail RLE Deficits / Details: Very limited ROM with poor quad set and 30-35 degrees knee flexion. Pt reporting 10/10 pain despite pain meds RLE: Unable to fully assess due to pain    Cervical / Trunk Assessment Cervical / Trunk Assessment: Normal  Communication  Communication: No difficulties  Cognition Arousal/Alertness: Awake/alert Behavior During Therapy: WFL for tasks assessed/performed Overall Cognitive Status: Within Functional Limits for tasks assessed                                        General Comments General comments (skin integrity, edema, etc.): Pt with o2 intact.  Reports she does not use o2 at home.     Exercises Total Joint Exercises Ankle Circles/Pumps: AROM Quad Sets: Both;5 reps Heel Slides: AAROM;Right;10 reps Hip ABduction/ADduction:  AAROM;Right;10 reps Goniometric ROM: -10 to 30-35 with assistance. limited by pain and bulky dressing   Assessment/Plan    PT Assessment Patient needs continued PT services  PT Problem List Decreased strength;Decreased range of motion;Decreased activity tolerance;Decreased balance;Decreased mobility;Pain       PT Treatment Interventions DME instruction;Gait training;Functional mobility training;Therapeutic activities;Therapeutic exercise;Patient/family education    PT Goals (Current goals can be found in the Care Plan section)  Acute Rehab PT Goals Patient Stated Goal: go to rehab then home PT Goal Formulation: With patient Time For Goal Achievement: 05/15/17 Potential to Achieve Goals: Good    Frequency 7X/week   Barriers to discharge        Co-evaluation PT/OT/SLP Co-Evaluation/Treatment: Yes Reason for Co-Treatment: For patient/therapist safety PT goals addressed during session: Mobility/safety with mobility;Proper use of DME         AM-PAC PT "6 Clicks" Daily Activity  Outcome Measure Difficulty turning over in bed (including adjusting bedclothes, sheets and blankets)?: A Little Difficulty moving from lying on back to sitting on the side of the bed? : Unable Difficulty sitting down on and standing up from a chair with arms (e.g., wheelchair, bedside commode, etc,.)?: Unable Help needed moving to and from a bed to chair (including a wheelchair)?: A Little Help needed walking in hospital room?: A Little Help needed climbing 3-5 steps with a railing? : A Lot 6 Click Score: 13    End of Session Equipment Utilized During Treatment: Gait belt;Oxygen Activity Tolerance: Patient limited by pain Patient left: in chair;with call bell/phone within reach Nurse Communication: Mobility status PT Visit Diagnosis: Muscle weakness (generalized) (M62.81);Difficulty in walking, not elsewhere classified (R26.2);Pain Pain - Right/Left: Right Pain - part of body: Knee    Time:  1104-1130 PT Time Calculation (min) (ACUTE ONLY): 26 min   Charges:   PT Evaluation $PT Eval Moderate Complexity: 1 Mod     PT G Codes:        Carrie [name], [place] Pager [phone] 05/01/2017   Carrie [name] 05/01/2017, 12:34 PM

## 2017-05-01 NOTE — Plan of Care (Signed)
  Progressing Education: Knowledge of General Education information will improve 05/01/2017 1039 - Progressing by Rance Muir, RN Health Behavior/Discharge Planning: Ability to manage health-related needs will improve 05/01/2017 1039 - Progressing by Rance Muir, RN Clinical Measurements: Ability to maintain clinical measurements within normal limits will improve 05/01/2017 1039 - Progressing by Rance Muir, RN Will remain free from infection 05/01/2017 1039 - Progressing by Rance Muir, RN Diagnostic test results will improve 05/01/2017 1039 - Progressing by Rance Muir, RN Respiratory complications will improve 05/01/2017 1039 - Progressing by Rance Muir, RN Cardiovascular complication will be avoided 05/01/2017 1039 - Progressing by Rance Muir, RN Activity: Risk for activity intolerance will decrease 05/01/2017 1039 - Progressing by Rance Muir, RN Nutrition: Adequate nutrition will be maintained 05/01/2017 1039 - Progressing by Rance Muir, RN Coping: Level of anxiety will decrease 05/01/2017 1039 - Progressing by Rance Muir, RN Elimination: Will not experience complications related to bowel motility 05/01/2017 1039 - Progressing by Rance Muir, RN Will not experience complications related to urinary retention 05/01/2017 1039 - Progressing by Rance Muir, RN Pain Managment: General experience of comfort will improve 05/01/2017 1039 - Progressing by Rance Muir, RN Safety: Ability to remain free from injury will improve 05/01/2017 1039 - Progressing by Rance Muir, RN Skin Integrity: Risk for impaired skin integrity will decrease 05/01/2017 1039 - Progressing by Rance Muir, RN Education: Knowledge of the prescribed therapeutic regimen will improve 05/01/2017 1039 - Progressing by Rance Muir, RN Activity: Ability to avoid complications of mobility impairment will improve 05/01/2017 1039 - Progressing by Rance Muir, RN Range of joint motion will improve 05/01/2017 1039 - Progressing by Rance Muir, RN Clinical Measurements: Postoperative complications will be avoided or minimized 05/01/2017 1039 - Progressing by Rance Muir, RN Pain Management: Pain level will decrease with appropriate interventions 05/01/2017 1039 - Progressing by Rance Muir, RN Skin Integrity: Signs of wound healing will improve 05/01/2017 1039 - Progressing by Rance Muir, RN

## 2017-05-01 NOTE — NC FL2 (Signed)
[place] MEDICAID FL2 LEVEL OF CARE SCREENING TOOL     IDENTIFICATION  Patient Name: Carrie [name] Birthdate: 01/27/1951 Sex: female Admission Date (Current Location): 04/30/2017  [facility] and [place] Number:  Engineering geologist and Address:  [facility], [place] [address] [place], [place] [address]      Provider Number: [id number]  Attending Physician Name and Address:  Mcarthur Rossetti,*  Relative Name and Phone Number:  Marcelline Deist, sister, [phone]    Current Level of Care: Hospital Recommended Level of Care: [place] Prior Approval Number:    Date Approved/Denied:   PASRR Number: [id number]  Discharge Plan: SNF    Current Diagnoses: Patient Active Problem List   Diagnosis Date Noted  . Status post total right knee replacement 04/30/2017  . Unilateral primary osteoarthritis, right knee 03/18/2017  . Acute pain of right knee 03/18/2017  . History of total hip replacement, left 03/18/2017  . Cervical radiculopathy 07/25/2016  . Chronic kidney disease, stage III (moderate) ([place]) 05/07/2016  . Unilateral primary osteoarthritis, left hip 03/27/2016  . Status post total replacement of left hip 03/27/2016  . Left hip pain 03/05/2016  . Mood disorder ([place]) 04/01/2015  . Dental infection 02/03/2014  . Osteoarthritis of right knee 02/03/2014  . Neck pain 02/03/2014  . Advanced directives, counseling/discussion 02/03/2014  . Routine general medical examination at a health care facility 07/08/2012  . Obesity, morbid, BMI 40.0-49.9 ([place]) 07/08/2012  . Type 2 diabetes, controlled, with renal manifestation ([place])   . COMMON MIGRAINE 09/27/2006  . HYPERCHOLESTEROLEMIA 09/26/2006  . Essential hypertension, benign 09/26/2006  . ALLERGIC RHINITIS 09/26/2006  . MENINGIOMA 08/03/2006    Orientation RESPIRATION BLADDER Height & Weight     Self, Time, Situation, Place  O2(Nasal Cannula 2L) Continent Weight: 260 lb 4.8  oz (118.1 kg) Height:  5\' 3"  (160 cm)  BEHAVIORAL SYMPTOMS/MOOD NEUROLOGICAL BOWEL NUTRITION STATUS      Continent Diet(See DC Summary)  AMBULATORY STATUS COMMUNICATION OF NEEDS Skin   Limited Assist Verbally Surgical wounds                       Personal Care Assistance Level of Assistance  Dressing, Bathing, Feeding Bathing Assistance: Maximum assistance Feeding assistance: Limited assistance Dressing Assistance: Maximum assistance     Functional Limitations Info  Sight, Hearing, Speech Sight Info: Adequate Hearing Info: Adequate Speech Info: Adequate    SPECIAL CARE FACTORS FREQUENCY  PT (By licensed PT), OT (By licensed OT)     PT Frequency: 5x week OT Frequency: 5x week            Contractures Contractures Info: Not present    Additional Factors Info  Code Status, Allergies, Isolation Precautions Code Status Info: Full Allergies Info: NAPROXEN SODIUM, SULFAMETHOXAZOLE-TRIMETHOPRIM      Isolation Precautions Info: MRSA     Current Medications (05/01/2017):  This is the current hospital active medication list Current Facility-Administered Medications  Medication Dose Route Frequency Provider Last Rate Last Dose  . 0.9 %  sodium chloride infusion   Intravenous Continuous Duane Boston, MD      . 0.9 %  sodium chloride infusion   Intravenous Continuous Mcarthur Rossetti, MD 75 mL/hr at 04/30/17 1501    . acetaminophen (TYLENOL) tablet 650 mg  650 mg Oral Q4H PRN Mcarthur Rossetti, MD       Or  . acetaminophen (TYLENOL) suppository 650 mg  650 mg Rectal Q4H PRN [name],  Lind Guest, MD      . alum & mag hydroxide-simeth (MAALOX/MYLANTA) 200-200-20 MG/5ML suspension 30 mL  30 mL Oral Q4H PRN Mcarthur Rossetti, MD      . amLODipine (NORVASC) tablet 10 mg  10 mg Oral Daily Mcarthur Rossetti, MD   10 mg at 05/01/17 1006  . atorvastatin (LIPITOR) tablet 40 mg  40 mg Oral Daily Mcarthur Rossetti, MD   40 mg at 05/01/17 1006  .  diphenhydrAMINE (BENADRYL) 12.5 MG/5ML elixir 12.5-25 mg  12.5-25 mg Oral Q4H PRN Mcarthur Rossetti, MD      . docusate sodium (COLACE) capsule 100 mg  100 mg Oral BID Mcarthur Rossetti, MD   100 mg at 05/01/17 1006  . gabapentin (NEURONTIN) capsule 100 mg  100 mg Oral BID Mcarthur Rossetti, MD   100 mg at 05/01/17 1006  . HYDROcodone-acetaminophen (NORCO/VICODIN) 5-325 MG per tablet 1-2 tablet  1-2 tablet Oral Q4H PRN Mcarthur Rossetti, MD   2 tablet at 05/01/17 0520  . HYDROmorphone (DILAUDID) injection 1 mg  1 mg Intravenous Q2H PRN Mcarthur Rossetti, MD   1 mg at 05/01/17 1812  . levETIRAcetam (KEPPRA) tablet 1,000 mg  1,000 mg Oral BID Mcarthur Rossetti, MD   1,000 mg at 05/01/17 1006  . LORazepam (ATIVAN) tablet 0.5 mg  0.5 mg Oral Q6H PRN Mcarthur Rossetti, MD      . menthol-cetylpyridinium (CEPACOL) lozenge 3 mg  1 lozenge Oral PRN Mcarthur Rossetti, MD       Or  . phenol (CHLORASEPTIC) mouth spray 1 spray  1 spray Mouth/Throat PRN Mcarthur Rossetti, MD      . methocarbamol (ROBAXIN) tablet 500 mg  500 mg Oral Q6H PRN Mcarthur Rossetti, MD   500 mg at 05/01/17 2017   Or  . methocarbamol (ROBAXIN) 500 mg in dextrose 5 % 50 mL IVPB  500 mg Intravenous Q6H PRN Mcarthur Rossetti, MD      . metoCLOPramide (REGLAN) tablet 5-10 mg  5-10 mg Oral Q8H PRN Mcarthur Rossetti, MD       Or  . metoCLOPramide (REGLAN) injection 5-10 mg  5-10 mg Intravenous Q8H PRN Mcarthur Rossetti, MD      . ondansetron [facility]) tablet 4 mg  4 mg Oral Q6H PRN Mcarthur Rossetti, MD       Or  . ondansetron [facility] injection 4 mg  4 mg Intravenous Q6H PRN Mcarthur Rossetti, MD      . oxyCODONE (Oxy IR/ROXICODONE) immediate release tablet 10 mg  10 mg Oral Q3H PRN Mcarthur Rossetti, MD   10 mg at 05/01/17 2016  . patient's guide to using coumadin book   Does not apply Once Skeet Simmer, RPH      . polyethylene glycol  (MIRALAX / GLYCOLAX) packet 17 g  17 g Oral Daily PRN Mcarthur Rossetti, MD      . warfarin (COUMADIN) video   Does not apply Once Skeet Simmer, [facility]      . Warfarin - Pharmacist Dosing Inpatient   Does not apply q1800 Skeet Simmer, RPH      . zolpidem [facility]) tablet 5 mg  5 mg Oral QHS PRN Mcarthur Rossetti, MD         Discharge Medications: Please see discharge summary for a list of discharge medications.  Relevant Imaging Results:  Relevant Lab Results:   Additional Information SS#: [id number]  Normajean Baxter, LCSW

## 2017-05-01 NOTE — Progress Notes (Signed)
PT Cancellation Note  Patient Details Name: Carrie [name] MRN: [id number] DOB: 10-19-1950   Cancelled Treatment:    Reason Eval/Treat Not Completed: Fatigue/lethargy limiting ability to participate. Returned to patient's room to work on therex due to difficulty with pain and ROM earlier.  Pt unable to stay awake.  Written HEP left in room.   Galen Manila 05/01/2017, 2:06 PM

## 2017-05-01 NOTE — Op Note (Signed)
NAME:  Carrie [name], Carrie [name] NO.:  MEDICAL RECORD NO.:  [id number]  LOCATION:                                 FACILITY:  PHYSICIAN:  Lind Guest. [name], M.D.DATE OF BIRTH:  DATE OF PROCEDURE:  04/30/2017 DATE OF DISCHARGE:                              OPERATIVE REPORT   PREOPERATIVE DIAGNOSIS:  Primary osteoarthritis and degenerative joint disease, right knee.  POSTOPERATIVE DIAGNOSIS:  Primary osteoarthritis and degenerative joint disease, right knee.  PROCEDURE:  Right total knee arthroplasty.  IMPLANTS:  Stryker Triathlon cemented knee with size 3 femur, size 4 tibial tray, 11-mm fix-bearing polyethylene insert, size 32 patellar button.  SURGEON:  Lind Guest. [name], M.D.  ASSISTANT:  Erskine Emery, PA-C.  ANESTHESIA: 1. General. 2. Right lower extremity adductor canal block.  ANTIBIOTICS:  3 g of IV Ancef.  BLOOD LOSS:  Less than 100 mL.  TOURNIQUET TIME:  Less than 1 hour.  INDICATIONS:  Carrie [name] is a 66 year old obese female with debilitating arthritis involving her right knee.  She also has chronic renal insufficiency.  This is being followed closely.  Her right knee pain is daily and it is detrimentally affected her activities of daily living, her quality of life and her mobility.  It is 10/10 at this point.  X-ray showed complete loss of the joint space with varus malalignment and significant large loose bodies throughout the knee.  With the failure of conservative treatment measures, she does wish to proceed with a total knee arthroplasty.  She understands the risk of acute blood loss anemia, nerve and vessel injury, fracture, infection, and DVT.  She also understands with her chronic renal disease, this could affect her kidneys with blood loss and we will watch those closely.  She understands our goals are to decrease pain, improve mobility and overall improved quality of life.  PROCEDURE DESCRIPTION:  After informed  consent was obtained, appropriate right knee was marked.  She was brought to the operating room after anesthesia obtained an adductor canal block of the right lower extremity in the holding room.  In the operating room, she was placed supine on the operating table and general anesthesia was then obtained.  A nonsterile tourniquet was placed around her upper right thigh and her right leg was prepped and draped from the thigh down the ankle with DuraPrep and sterile drapes including sterile stockinette.  Time-out was called and she was identified as correct patient and correct right ankle.  We then used an Esmarch to wrap out the leg and tourniquet was inflated to 300 mm of pressure.  We then made a direct midline incision over the knee and carried this proximally and distally.  We dissected down the knee joint and carried out a medial parapatellar arthrotomy finding a very large joint effusion and significant synovitis throughout the knee.  There were periarticular osteophytes in all three compartments and incredibly large loose bodies in the posterior aspect of the knee.  We removed remnants of the ACL, PCL, medial and lateral meniscus.  With the knee in a flexed position, we used extramedullary cutting guide for making our tibia cut correcting for varus and valgus and  neutral slope, setting our cut for 9 mm off the high side, we made our cut without difficulty.  We then went to the femur and used an intramedullary guide through the notch through our distal femoral cut, making this a 10-mm distal femoral cut setting our distal femoral cutting guide for right knee at 5 degrees externally rotated.  We made this cut without difficulty and brought the knee back down to full extension.  With the 9 and then 11-mm extension block, we had achieved full extension.  We then went back to the femur, and put our femoral sizing guide based off the epicondylar axis in 3 degrees right and chose a size 3  femur.  We put our 4-in-1 cutting block for a size 3 femur, we made our anterior and posterior cuts followed by our chamfer cuts.  We then made our femoral box cut.  Attention was then turned back to the tibia.  We trialed a size 4 tibial tray and got adequate coverage of the tibial plateau and set our rotation off the tibial tubercle on the femur.  We then made our keel cut off this.  With the trial 4 tibia followed by the trial 3 femur, we placed 11-mm fix-bearing trial polyethylene insert and we were pleased with range of motion and stability.  We then made our patellar cut and drilled 3 holes for size 32 patellar button.  We then removed all instrumentation from the knee and irrigated the knee with normal saline solution using pulsatile lavage.  We mixed our cement and then cemented the real Stryker Triathlon tibial tray size 4 followed by the real size 3 femur.  We removed cement debris from the knee and placed our 11-mm fix-bearing polyethylene insert and cemented our patellar button.  Once the cement had hardened, we removed more cement from the knee and then let the tourniquet down.  Hemostasis was obtained with electrocautery.  We irrigated the knee again with normal saline solution using pulsatile lavage, and then closed the joint capsule with interrupted #1 Ethibond suture followed by 0 Vicryl in the deep tissue, 2-0 Vicryl in the subcutaneous tissue, interrupted staples on the skin.  Xeroform and well- padded sterile dressings applied.  She was awakened, extubated and taken to the recovery room in stable condition.  All final counts were correct.  There were no complications noted.     Lind Guest. [name], M.D.     CYB/MEDQ  D:  04/30/2017  T:  05/01/2017  Job:  [id number]

## 2017-05-01 NOTE — Telephone Encounter (Signed)
Should have refill left still Check with pharmacist She is in hospital right now after TKR

## 2017-05-01 NOTE — Progress Notes (Signed)
Subjective: 1 Day Post-Op Procedure(s) (LRB): RIGHT TOTAL KNEE ARTHROPLASTY (Right) Patient reports pain as moderate.  Acute blood loss anemia from surgery.  Creatinine still high (baseline chronic renal insufficiency Stage III/IV from HTN).  Objective: Vital signs in last 24 hours: Temp:  [97.2 F (36.2 C)-98.5 F (36.9 C)] 98.5 F (36.9 C) (11/27 2100) Pulse Rate:  [73-101] 101 (11/27 2100) Resp:  [10-20] 20 (11/27 2100) BP: (100-154)/(53-84) 141/65 (11/27 2100) SpO2:  [93 %-100 %] 100 % (11/27 2100) Weight:  [260 lb 4.8 oz (118.1 kg)] 260 lb 4.8 oz (118.1 kg) (11/27 0910)  Intake/Output from previous day: 11/27 0701 - 11/28 0700 In: 1713.8 [P.O.:240; I.V.:1473.8] Out: 350 [Urine:200; Blood:150] Intake/Output this shift: No intake/output data recorded.  Recent Labs    05/01/17 0604  HGB 9.1*   Recent Labs    05/01/17 0604  WBC 11.6*  RBC 3.45*  HCT 29.6*  PLT 231   Recent Labs    04/30/17 0907 05/01/17 0604  NA 139 140  K 4.5 4.7  CL 105 109  CO2 24 24  BUN 43* 41*  CREATININE 2.38* 2.49*  GLUCOSE 123* 137*  CALCIUM 9.1 8.5*   Recent Labs    05/01/17 0604  INR 1.12    Sensation intact distally Intact pulses distally Dorsiflexion/Plantar flexion intact Incision: dressing C/D/I Compartment soft  Assessment/Plan: 1 Day Post-Op Procedure(s) (LRB): RIGHT TOTAL KNEE ARTHROPLASTY (Right) Up with therapy Discharge to SNF by Friday. Monitor H/H and creatinine closely - if creatinine increases further, will transfuse blood (I spoke with the patient about this).  Mcarthur Rossetti 05/01/2017, 7:25 AM

## 2017-05-01 NOTE — Evaluation (Signed)
Occupational Therapy Evaluation Patient Details Name: Carrie [name] MRN: [id number] DOB: 1951-02-23 Today's Date: 05/01/2017    History of Present Illness 66 y/o female s/p R TKA (04/30/17). PMH includes meningioma (s/p brain surgery), CKD, HTN, DMtype 2   Clinical Impression   This 66 y/o F presents with the above. At baseline Pt was mod independent with ADLs and functional mobility. Pt presents with pain in RLE, decreased activity tolerance and balance. Pt completed stand pivot transfers at RW level EOB>BSC>recliner with MinA +2 this session, currently requires MaxA for LB ADLs. Pt reports plans for ST rehab in SNF setting prior to return home, feel this is appropriate to maximize Pt's safety and independence with ADLs and mobility. Will continue to follow acutely.     Follow Up Recommendations  DC plan and follow up therapy as arranged by surgeon;SNF    Equipment Recommendations  Other (comment)(defer to next venue )           Precautions / Restrictions Precautions Precautions: Fall;Knee Restrictions Weight Bearing Restrictions: Yes RLE Weight Bearing: Weight bearing as tolerated      Mobility Bed Mobility Overal bed mobility: Needs Assistance Bed Mobility: Supine to Sit     Supine to sit: Min assist     General bed mobility comments: MIN A for R LE only with Pt able to get trunk upright with HOB elevated  Transfers Overall transfer level: Needs assistance Equipment used: Rolling walker (2 wheeled) Transfers: Sit to/from [facility] Sit to Stand: Min assist;+2 safety/equipment Stand pivot transfers: Min assist;+2 safety/equipment       General transfer comment: Pt needed cues for hand placement and for use of UE to unweight R LE.  Pt able to take steps backwards with cueing.  bed > BSC > recliner    Balance Overall balance assessment: Needs assistance   Sitting balance-Leahy Scale: Good     Standing balance support: Bilateral upper  extremity supported Standing balance-Leahy Scale: Poor Standing balance comment: requires UE support                           ADL either performed or assessed with clinical judgement   ADL Overall ADL's : Needs assistance/impaired Eating/Feeding: Independent;Sitting   Grooming: Set up;Sitting   Upper Body Bathing: Min guard;Sitting   Lower Body Bathing: Moderate assistance;Sit to/from stand   Upper Body Dressing : Min guard;Sitting   Lower Body Dressing: Maximal assistance;Sit to/from stand   Toilet Transfer: Minimal assistance;+2 for safety/equipment;Stand-pivot;BSC;RW   Toileting- Clothing Manipulation and Hygiene: Moderate assistance;+2 for safety/equipment;Sit to/from stand Toileting - Clothing Manipulation Details (indicate cue type and reason): Pt able to lean forward for therapist to assist with pericare while seated on BSC, assist for gown management      Functional mobility during ADLs: Minimal assistance;+2 for safety/equipment;Rolling walker                           Pertinent Vitals/Pain Pain Assessment: 0-10 Pain Score: 10-Worst pain ever Pain Descriptors / Indicators: Throbbing Pain Intervention(s): Premedicated before session;Ice applied;Patient requesting pain meds-RN notified;RN gave pain meds during session     Hand Dominance Right   Extremity/Trunk Assessment Upper Extremity Assessment Upper Extremity Assessment: Generalized weakness   Lower Extremity Assessment Lower Extremity Assessment: Defer to PT evaluation RLE Deficits / Details: Very limited ROM with poor quad set and 30-35 degrees knee flexion. Pt reporting 10/10 pain despite pain meds  RLE: Unable to fully assess due to pain   Cervical / Trunk Assessment Cervical / Trunk Assessment: Normal   Communication Communication Communication: No difficulties   Cognition Arousal/Alertness: Awake/alert Behavior During Therapy: WFL for tasks assessed/performed Overall  Cognitive Status: Within Functional Limits for tasks assessed                                     General Comments  Pt with o2 intact.  Reports she does not use o2 at home.               Home Living Family/patient expects to be discharged to:: Skilled nursing facility Living Arrangements: Other relatives                               Additional Comments: Pt has a RW, cane, BSC      Prior Functioning/Environment Level of Independence: Independent with assistive device(s)        Comments: Amb with cane        OT Problem List: Decreased strength;Impaired balance (sitting and/or standing);Decreased knowledge of precautions;Pain;Decreased range of motion;Decreased activity tolerance;Decreased knowledge of use of DME or AE      OT Treatment/Interventions: Self-care/ADL training;DME and/or AE instruction;Therapeutic activities;Balance training;Therapeutic exercise;Patient/family education    OT Goals(Current goals can be found in the care plan section) Acute Rehab OT Goals Patient Stated Goal: go to rehab then home OT Goal Formulation: With patient Time For Goal Achievement: 05/15/17 Potential to Achieve Goals: Good  OT Frequency: Min 2X/week               Co-evaluation PT/OT/SLP Co-Evaluation/Treatment: Yes Reason for Co-Treatment: For patient/therapist safety PT goals addressed during session: Mobility/safety with mobility;Proper use of DME OT goals addressed during session: ADL's and self-care;Proper use of Adaptive equipment and DME      AM-PAC PT "6 Clicks" Daily Activity     Outcome Measure Help from another person eating meals?: None Help from another person taking care of personal grooming?: A Little Help from another person toileting, which includes using toliet, bedpan, or urinal?: A Lot Help from another person bathing (including washing, rinsing, drying)?: A Lot Help from another person to put on and taking off regular upper  body clothing?: A Little Help from another person to put on and taking off regular lower body clothing?: A Lot 6 Click Score: 16   End of Session Equipment Utilized During Treatment: Gait belt;Rolling walker;Oxygen CPM Right Knee CPM Right Knee: Off(off upon entry into room) Nurse Communication: Mobility status;Patient requests pain meds  Activity Tolerance: Patient tolerated treatment well;Patient limited by pain Patient left: in chair;with call bell/phone within reach  OT Visit Diagnosis: Unsteadiness on feet (R26.81);Other abnormalities of gait and mobility (R26.89);Pain Pain - Right/Left: Right Pain - part of body: Knee                Time: 1104-1130 OT Time Calculation (min): 26 min Charges:  OT General Charges $OT Visit: 1 Visit OT Evaluation $OT Eval Low Complexity: 1 Low G-Codes:     Lou Cal, OT Pager [phone] 05/01/2017   Raymondo Band 05/01/2017, 12:52 PM

## 2017-05-02 ENCOUNTER — Encounter (HOSPITAL_COMMUNITY): Payer: Self-pay | Admitting: General Practice

## 2017-05-02 LAB — BASIC METABOLIC PANEL
ANION GAP: 6 (ref 5–15)
BUN: 32 mg/dL — ABNORMAL HIGH (ref 6–20)
CALCIUM: 8.6 mg/dL — AB (ref 8.9–10.3)
CO2: 25 mmol/L (ref 22–32)
Chloride: 109 mmol/L (ref 101–111)
Creatinine, Ser: 2.24 mg/dL — ABNORMAL HIGH (ref 0.44–1.00)
GFR calc non Af Amer: 21 mL/min — ABNORMAL LOW (ref >60)
GFR, EST AFRICAN AMERICAN: 25 mL/min — AB (ref >60)
GLUCOSE: 130 mg/dL — AB (ref 65–99)
POTASSIUM: 4.1 mmol/L (ref 3.5–5.1)
Sodium: 140 mmol/L (ref 135–145)

## 2017-05-02 LAB — CBC
HEMATOCRIT: 28.7 % — AB (ref 36.0–46.0)
HEMOGLOBIN: 8.9 g/dL — AB (ref 12.0–15.0)
MCH: 26.6 pg (ref 26.0–34.0)
MCHC: 31 g/dL (ref 30.0–36.0)
MCV: 85.9 fL (ref 78.0–100.0)
Platelets: 209 10*3/uL (ref 150–400)
RBC: 3.34 MIL/uL — AB (ref 3.87–5.11)
RDW: 15.9 % — ABNORMAL HIGH (ref 11.5–15.5)
WBC: 12.1 10*3/uL — AB (ref 4.0–10.5)

## 2017-05-02 LAB — PROTIME-INR
INR: 1.37
Prothrombin Time: 16.8 seconds — ABNORMAL HIGH (ref 11.4–15.2)

## 2017-05-02 MED ORDER — TIZANIDINE HCL 2 MG PO TABS: 2.0000 mg | ORAL_TABLET | Freq: Two times a day (BID) | ORAL | 0 refills | Status: DC | PRN

## 2017-05-02 MED ORDER — METHOCARBAMOL 500 MG PO TABS: 500.0000 mg | ORAL_TABLET | Freq: Four times a day (QID) | ORAL | 0 refills | Status: DC | PRN

## 2017-05-02 MED ORDER — WARFARIN SODIUM 7.5 MG PO TABS
7.5000 mg | ORAL_TABLET | Freq: Once | ORAL | Status: AC
  Administered 2017-05-02: 7.5 mg via ORAL
  Filled 2017-05-02: qty 1

## 2017-05-02 MED ORDER — OXYCODONE-ACETAMINOPHEN 5-325 MG PO TABS: 1.0000 | ORAL_TABLET | ORAL | 0 refills | Status: DC | PRN

## 2017-05-02 NOTE — Progress Notes (Signed)
Orthopedic Tech Progress Note Patient Details:  Carrie [name] 07-Jul-1950 [id number]  Patient ID: Carrie [name], female   DOB: 09/03/1950, 66 y.o.   MRN: [id number] Pt refused cpm stating dr told her she no longer needed to use the cpm.  Karolee Stamps 05/02/2017, 5:46 AM

## 2017-05-02 NOTE — Progress Notes (Signed)
Orthopedic Tech Progress Note Patient Details:  Carrie [name] [birth year] [id number]  CPM Right Knee CPM Right Knee: Off Right Knee Flexion (Degrees): 0 Right Knee Extension (Degrees): 0 Additional Comments: Pt up in chair at this time.    Kristopher Oppenheim 05/02/2017, 2:02 PM

## 2017-05-02 NOTE — Progress Notes (Signed)
Subjective: 2 Days Post-Op Procedure(s) (LRB): RIGHT TOTAL KNEE ARTHROPLASTY (Right) Patient reports pain as moderate.  Acute blood loss anemia from her surgery, but tolerating it well with normal vitals and her creatinine actually has lowered (improved).  Objective: Vital signs in last 24 hours: Temp:  [98.1 F (36.7 C)-99.3 F (37.4 C)] 98.1 F (36.7 C) (11/29 0445) Pulse Rate:  [88-120] 88 (11/29 0445) Resp:  [18-20] 18 (11/29 0445) BP: (130-144)/(69-79) 130/69 (11/29 0445) SpO2:  [97 %-99 %] 99 % (11/29 0445)  Intake/Output from previous day: 11/28 0701 - 11/29 0700 In: 1080 [P.O.:1080] Out: 3100 [Urine:3100] Intake/Output this shift: No intake/output data recorded.  Recent Labs    05/01/17 0604 05/02/17 0701  HGB 9.1* 8.9*   Recent Labs    05/01/17 0604 05/02/17 0701  WBC 11.6* 12.1*  RBC 3.45* 3.34*  HCT 29.6* 28.7*  PLT 231 209   Recent Labs    05/01/17 0604 05/02/17 0701  NA 140 140  K 4.7 4.1  CL 109 109  CO2 24 25  BUN 41* 32*  CREATININE 2.49* 2.24*  GLUCOSE 137* 130*  CALCIUM 8.5* 8.6*   Recent Labs    05/01/17 0604 05/02/17 0701  INR 1.12 1.37    Sensation intact distally Intact pulses distally Dorsiflexion/Plantar flexion intact Incision: dressing C/D/I No cellulitis present Compartment soft  Assessment/Plan: 2 Days Post-Op Procedure(s) (LRB): RIGHT TOTAL KNEE ARTHROPLASTY (Right) Advance diet Plan for discharge tomorrow Discharge to SNF    Mcarthur Rossetti 05/02/2017, 9:55 AM

## 2017-05-02 NOTE — Clinical Social Work Note (Signed)
Clinical Social Work Assessment  Patient Details  Name: Carrie [name] MRN: [id number] Date of Birth: 05-16-1951  Date of referral:  05/02/17               Reason for consult:  Facility Placement                Permission sought to share information with:  Chartered certified accountant granted to share information::  Yes, Verbal Permission Granted  Name::     [facility]  Agency::  SNF  Relationship::  middle sister  Contact Information:     Housing/Transportation Living arrangements for the past 2 months:  Single Family Home Source of Information:  Patient, Other (Comment Required)(Sister) Patient Interpreter Needed:  None Criminal Activity/Legal Involvement Pertinent to Current Situation/Hospitalization:  No - Comment as needed Significant Relationships:  Siblings Lives with:  Self Do you feel safe going back to the place where you live?  No Need for family participation in patient care:  Yes (Comment)  Care giving concerns:  Pt from home and is unable to be cared for at home properly with new impairment. Middle Sister Carrie [name] at bedside and they are in agreement with patient going to SNF in [place].  CSW will f/u.  Social Worker assessment / plan:  CSW discussed the SNF process and answered family's questions. CSW obtained permission to send to [facility] in [place]. Family interested in [facility]. CSW will f/u. CSW explained the insurance and the need for Insurance Auth prior to discharge to SNF. CSW will assist with transport as well when ready.  Employment status:  Retired Nurse, adult PT Recommendations:  [place] / Referral to community resources:  [place]  Patient/Family's Response to care:  Psychologist, prison and probation services of CSW assistance with SNF options and placement. No issues or concerns identified.  Patient/Family's Understanding of and Emotional Response to Diagnosis,  Current Treatment, and Prognosis:  Patient/family has good understanding of diagnosis, current treatment and prognosis as they have agreed to SNF and understand that patient unable to be cared for at home safely. Pt hopeful to return home after short term rehab and return to normal activities. No issues or concerns identified.  Emotional Assessment Appearance:  Appears stated age Attitude/Demeanor/Rapport:  (Cooperative) Affect (typically observed):  Accepting, Appropriate Orientation:  Oriented to Situation, Oriented to  Time, Oriented to Place, Oriented to Self Alcohol / Substance use:  Not Applicable Psych involvement (Current and /or in the community):  No (Comment)  Discharge Needs  Concerns to be addressed:  Discharge Planning Concerns Readmission within the last 30 days:  No Current discharge risk:  Dependent with Mobility, Physical Impairment Barriers to Discharge:  No Barriers Identified   Normajean Baxter, LCSW 05/02/2017, 2:48 PM

## 2017-05-02 NOTE — Telephone Encounter (Signed)
Approved:  #[id number] x [phone] 

## 2017-05-02 NOTE — Telephone Encounter (Signed)
Rx sent electronically.  

## 2017-05-02 NOTE — Social Work (Signed)
CSW discussed bed offers with patient and family earlier today and they accepted bed offer at [facility].   CSW spoke with Broadus John at [facility] and he confirmed bed offer. CSW discussed insurance auth.  CSW faxed clinicals to [facility] to obtain Insurance auth for SNF placement.  Elissa Hefty, LCSW Clinical Social Worker [phone]

## 2017-05-02 NOTE — Progress Notes (Signed)
[facility] for Coumadin Indication: VTE prophylaxis  Allergies  Allergen Reactions  . Naproxen Sodium Anaphylaxis and Swelling  . Sulfamethoxazole-Trimethoprim Anaphylaxis and Swelling    Patient Measurements: Height: 5\' 3"  (160 cm) Weight: 260 lb 4.8 oz (118.1 kg) IBW/kg (Calculated) : 52.4  Vital Signs: Temp: 98.1 F (36.7 C) (11/29 0445) Temp Source: Oral (11/29 0445) BP: 130/69 (11/29 0445) Pulse Rate: 88 (11/29 0445)  Labs: Recent Labs    04/30/17 0907 05/01/17 0604 05/02/17 0701  HGB  --  9.1* 8.9*  HCT  --  29.6* 28.7*  PLT  --  231 209  LABPROT  --  14.3 16.8*  INR  --  1.12 1.37  CREATININE 2.38* 2.49* 2.24*    Estimated Creatinine Clearance: 29.9 mL/min (A) (by C-G formula based on SCr of 2.24 mg/dL (H)).   Medical History: Past Medical History:  Diagnosis Date  . Allergy   . Arthritis    "right knee" (10/26'/2017)  . CKD (chronic kidney disease), stage III ([place])    12/2015 (Dr. Lavonia Dana)  . Depression   . History of blood transfusion 2008   "when I had brain tumor surgery"  . History of MRSA infection 2008  . Hypertension   . Meningioma ([place])    Foramen magnum  . Type II diabetes mellitus ([place]) 2011   on metformin until yr ago- no med now - off due to kidneys   Assessment: 66 yr old female to begin Coumadin for VTE prophylaxis s/p right TKA on 04/30/17. No anticoagulation prior to admission. Baseline INR 1.12, now up to 1.37, CBC stable. No bleed documented.  Goal of Therapy:  INR 2-3 Monitor platelets by anticoagulation protocol: Yes   Plan:  Coumadin 7.5 mg x 1 tonight. Daily PT/INR. Monitor CBC, s/sx bleeding   Elicia Lamp, PharmD, BCPS Clinical Pharmacist Rx Phone # for today: [phone] After 3:30PM, please call Main Rx: [phone] 05/02/2017 9:05 AM

## 2017-05-02 NOTE — Telephone Encounter (Signed)
Spoke to [place] at the pharmacy. He said her insurance requires a 90 day rx for this. They need a new rx.

## 2017-05-02 NOTE — Progress Notes (Signed)
Physical Therapy Treatment Patient Details Name: Carrie [name] MRN: [id number] DOB: January 26, 1951 Today's Date: 05/02/2017    History of Present Illness 66 y/o female s/p R TKA (04/30/17). PMH includes meningioma (s/p brain surgery), CKD, HTN, DMtype 2    PT Comments    Pt progressing with mobility. Ambulated 25 feet with RW min guard assist. Distance limited by pain and dizziness. Pt completed RLE exercises with min assist. PT to continue per POC. Plan is for d/c to SNF tomorrow.    Follow Up Recommendations  SNF;Supervision/Assistance - 24 hour     Equipment Recommendations  None recommended by PT    Recommendations for Other Services       Precautions / Restrictions Precautions Precautions: Fall;Knee Restrictions Weight Bearing Restrictions: Yes RLE Weight Bearing: Weight bearing as tolerated    Mobility  Bed Mobility               General bed mobility comments: Pt up in recliner.  Transfers   Equipment used: Rolling walker (2 wheeled)   Sit to Stand: Min assist         General transfer comment: verbal cues for sequencing, increased time to stabilize initial standing balance  Ambulation/Gait Ambulation/Gait assistance: Min guard Ambulation Distance (Feet): 25 Feet Assistive device: Rolling walker (2 wheeled) Gait Pattern/deviations: Step-to pattern;Decreased stride length;Antalgic Gait velocity: decreased Gait velocity interpretation: Below normal speed for age/gender General Gait Details: Verbal cues for sequencing and step length. Pt with c/o dizziness during ambulation.   Stairs            Wheelchair Mobility    Modified Rankin (Stroke Patients Only)       Balance   Sitting-balance support: No upper extremity supported;Feet supported Sitting balance-Leahy Scale: Good     Standing balance support: Bilateral upper extremity supported;During functional activity   Standing balance comment: heavy reliance on RW                             Cognition Arousal/Alertness: Awake/alert Behavior During Therapy: WFL for tasks assessed/performed Overall Cognitive Status: Within Functional Limits for tasks assessed                                        Exercises Total Joint Exercises Ankle Circles/Pumps: AROM;Both;10 reps Quad Sets: Right;10 reps;AAROM Short Arc QuadSinclair Ship;Right;10 reps Heel Slides: AAROM;Right;10 reps Hip ABduction/ADduction: AAROM;Right;10 reps Goniometric ROM: 5-45 degrees R knee    General Comments        Pertinent Vitals/Pain Pain Assessment: 0-10 Pain Score: 10-Worst pain ever Pain Location: R knee Pain Descriptors / Indicators: Aching;Throbbing Pain Intervention(s): Limited activity within patient's tolerance    Home Living                      Prior Function            PT Goals (current goals can now be found in the care plan section) Acute Rehab PT Goals Patient Stated Goal: go to rehab then home PT Goal Formulation: With patient Time For Goal Achievement: 05/15/17 Potential to Achieve Goals: Good Progress towards PT goals: Progressing toward goals    Frequency    7X/week      PT Plan Current plan remains appropriate    Co-evaluation              AM-PAC  PT "6 Clicks" Daily Activity  Outcome Measure  Difficulty turning over in bed (including adjusting bedclothes, sheets and blankets)?: A Little Difficulty moving from lying on back to sitting on the side of the bed? : Unable Difficulty sitting down on and standing up from a chair with arms (e.g., wheelchair, bedside commode, etc,.)?: Unable Help needed moving to and from a bed to chair (including a wheelchair)?: A Little Help needed walking in hospital room?: A Little Help needed climbing 3-5 steps with a railing? : A Lot 6 Click Score: 13    End of Session Equipment Utilized During Treatment: Gait belt Activity Tolerance: Patient limited by pain Patient left: in  chair;with family/visitor present;with call bell/phone within reach Nurse Communication: Mobility status PT Visit Diagnosis: Muscle weakness (generalized) (M62.81);Difficulty in walking, not elsewhere classified (R26.2);Pain Pain - Right/Left: Right Pain - part of body: Knee     Time: 1110-1141 PT Time Calculation (min) (ACUTE ONLY): 31 min  Charges:  $Gait Training: 8-22 mins $Therapeutic Exercise: 8-22 mins                    G Codes:       Lorrin Goodell, PT  Office # [phone] Pager [phone]    Lorriane Shire 05/02/2017, 11:47 AM

## 2017-05-03 ENCOUNTER — Other Ambulatory Visit: Payer: Self-pay | Admitting: Internal Medicine

## 2017-05-03 DIAGNOSIS — M6281 Muscle weakness (generalized): Secondary | ICD-10-CM | POA: Diagnosis not present

## 2017-05-03 DIAGNOSIS — E785 Hyperlipidemia, unspecified: Secondary | ICD-10-CM | POA: Diagnosis not present

## 2017-05-03 DIAGNOSIS — I10 Essential (primary) hypertension: Secondary | ICD-10-CM | POA: Diagnosis not present

## 2017-05-03 DIAGNOSIS — Z7901 Long term (current) use of anticoagulants: Secondary | ICD-10-CM | POA: Diagnosis not present

## 2017-05-03 DIAGNOSIS — Z96651 Presence of right artificial knee joint: Secondary | ICD-10-CM | POA: Diagnosis not present

## 2017-05-03 DIAGNOSIS — D649 Anemia, unspecified: Secondary | ICD-10-CM | POA: Diagnosis not present

## 2017-05-03 DIAGNOSIS — S8990XA Unspecified injury of unspecified lower leg, initial encounter: Secondary | ICD-10-CM | POA: Diagnosis not present

## 2017-05-03 DIAGNOSIS — R52 Pain, unspecified: Secondary | ICD-10-CM | POA: Diagnosis not present

## 2017-05-03 DIAGNOSIS — Z79891 Long term (current) use of opiate analgesic: Secondary | ICD-10-CM | POA: Diagnosis not present

## 2017-05-03 DIAGNOSIS — M62838 Other muscle spasm: Secondary | ICD-10-CM | POA: Diagnosis not present

## 2017-05-03 DIAGNOSIS — R569 Unspecified convulsions: Secondary | ICD-10-CM | POA: Diagnosis not present

## 2017-05-03 DIAGNOSIS — M1711 Unilateral primary osteoarthritis, right knee: Secondary | ICD-10-CM | POA: Diagnosis not present

## 2017-05-03 DIAGNOSIS — F411 Generalized anxiety disorder: Secondary | ICD-10-CM | POA: Diagnosis not present

## 2017-05-03 DIAGNOSIS — G8911 Acute pain due to trauma: Secondary | ICD-10-CM | POA: Diagnosis not present

## 2017-05-03 DIAGNOSIS — Z5189 Encounter for other specified aftercare: Secondary | ICD-10-CM | POA: Diagnosis not present

## 2017-05-03 DIAGNOSIS — Z4789 Encounter for other orthopedic aftercare: Secondary | ICD-10-CM | POA: Diagnosis not present

## 2017-05-03 LAB — PROTIME-INR
INR: 1.61
PROTHROMBIN TIME: 19 s — AB (ref 11.4–15.2)

## 2017-05-03 MED ORDER — WARFARIN SODIUM 2.5 MG PO TABS: 2.5000 mg | ORAL_TABLET | Freq: Every day | ORAL | 0 refills | Status: DC

## 2017-05-03 NOTE — Progress Notes (Addendum)
CSW received [employer] approval for patient to discharge to [facility] today.  Percell Locus Jaia Alonge LCSWA [phone]

## 2017-05-03 NOTE — Progress Notes (Signed)
Report called to Kim, LPN at [facility].   

## 2017-05-03 NOTE — Discharge Summary (Signed)
Patient ID: Carrie [name] MRN: [id number] DOB/AGE: [birth year] 68 y.o.  Admit date: 04/30/2017 Discharge date: 05/03/2017  Admission Diagnoses:  Principal Problem:   Osteoarthritis of right knee Active Problems:   Status post total right knee replacement   Discharge Diagnoses:  Same  Past Medical History:  Diagnosis Date  . Allergy   . Arthritis    "right knee" (10/26'/2017)  . CKD (chronic kidney disease), stage III ([place])    12/2015 (Dr. Lavonia Dana)  . Depression   . History of blood transfusion 2008   "when I had brain tumor surgery"  . History of MRSA infection 2008  . Hypertension   . Meningioma ([place])    Foramen magnum  . Type II diabetes mellitus ([place]) 2011   on metformin until yr ago- no med now - off due to kidneys    Surgeries: Procedure(s): RIGHT TOTAL KNEE ARTHROPLASTY on 04/30/2017   Consultants:   Discharged Condition: Improved  Hospital Course: Carrie [name] is an 66 y.o. female who was admitted 04/30/2017 for operative treatment ofOsteoarthritis of right knee. Patient has severe unremitting pain that affects sleep, daily activities, and work/hobbies. After pre-op clearance the patient was taken to the operating room on 04/30/2017 and underwent  Procedure(s): RIGHT TOTAL KNEE ARTHROPLASTY.    Patient was given perioperative antibiotics:  Anti-infectives (From admission, onward)   Start     Dose/Rate Route Frequency Ordered Stop   04/30/17 1830  ceFAZolin (ANCEF) IVPB 1 g/50 mL premix     1 g 100 mL/hr over 30 Minutes Intravenous Every 6 hours 04/30/17 1334 05/01/17 0104   04/30/17 0900  ceFAZolin (ANCEF) 3 g in dextrose 5 % 50 mL IVPB     3 g 130 mL/hr over 30 Minutes Intravenous To ShortStay Surgical 04/29/17 0717 04/30/17 1149       Patient was given sequential compression devices, early ambulation, and chemoprophylaxis to prevent DVT.  Patient benefited maximally from hospital stay and there were no complications.    Recent vital  signs:  Patient Vitals for the past 24 hrs:  BP Temp Temp src Pulse Resp SpO2  05/03/17 0406 101/62 98.4 F (36.9 C) Oral (!) 108 17 96 %  05/02/17 2017 (!) 141/69 99.8 F (37.7 C) Oral (!) 118 17 95 %     Recent laboratory studies:  Recent Labs    05/01/17 0604 05/02/17 0701 05/03/17 0354  WBC 11.6* 12.1*  --   HGB 9.1* 8.9*  --   HCT 29.6* 28.7*  --   PLT 231 209  --   NA 140 140  --   K 4.7 4.1  --   CL 109 109  --   CO2 24 25  --   BUN 41* 32*  --   CREATININE 2.49* 2.24*  --   GLUCOSE 137* 130*  --   INR 1.12 1.37 1.61  CALCIUM 8.5* 8.6*  --      Discharge Medications:   Allergies as of 05/03/2017      Reactions   Naproxen Sodium Anaphylaxis, Swelling   Sulfamethoxazole-trimethoprim Anaphylaxis, Swelling      Medication List    STOP taking these medications   tiZANidine 2 MG tablet Commonly known as:  ZANAFLEX     TAKE these medications   acetaminophen-codeine 300-30 MG tablet Commonly known as:  TYLENOL #3 Take 1-2 tablets every 6 (six) hours as needed by mouth for moderate pain.   amLODipine 10 MG tablet Commonly known as:  NORVASC TAKE 1  TABLET EVERY DAY What changed:    how much to take  how to take this  when to take this   atorvastatin 40 MG tablet Commonly known as:  LIPITOR TAKE ONE TABLET BY MOUTH ONE TIME DAILY What changed:    how much to take  how to take this  when to take this   gabapentin 100 MG capsule Commonly known as:  NEURONTIN Take 1 capsule (100 mg total) by mouth 3 (three) times daily. What changed:  when to take this   levETIRAcetam 1000 MG tablet Commonly known as:  KEPPRA TAKE ONE TABLET BY MOUTH TWICE DAILY What changed:    how much to take  how to take this  when to take this   LORazepam 0.5 MG tablet Commonly known as:  ATIVAN TAKE 1/2-1 TABLET BY MOUTH 3 TIMES A DAY AS NEEDED What changed:  See the new instructions.   losartan 50 MG tablet Commonly known as:  COZAAR Take 50 mg by mouth  daily.   losartan-hydrochlorothiazide 50-12.5 MG tablet Commonly known as:  HYZAAR Take 1 tablet by mouth daily.   methocarbamol 500 MG tablet Commonly known as:  ROBAXIN Take 1 tablet (500 mg total) by mouth every 6 (six) hours as needed for muscle spasms.   oxyCODONE-acetaminophen 5-325 MG tablet Commonly known as:  ROXICET Take 1-2 tablets by mouth every 4 (four) hours as needed.   Vitamin D (Ergocalciferol) 50000 units Caps capsule Commonly known as:  DRISDOL TAKE 1 CAPSULE BY MOUTH ONCE A MONTH What changed:    how much to take  how to take this  when to take this  additional instructions   warfarin 2.5 MG tablet Commonly known as:  COUMADIN Take 1 tablet (2.5 mg total) by mouth daily.            Durable Medical Equipment  (From admission, onward)        Start     Ordered   04/30/17 1808  DME 3 n 1  Once     04/30/17 1807   04/30/17 1808  DME Walker rolling  Once    Question:  Patient needs a walker to treat with the following condition  Answer:  Status post total right knee replacement   04/30/17 1807      Diagnostic Studies: Dg Knee Right Port  Result Date: 04/30/2017 CLINICAL DATA:  Knee replacement. EXAM: PORTABLE RIGHT KNEE - 1-2 VIEW COMPARISON:  03/18/2017 . FINDINGS: Total right knee replacement with anatomic alignment. Hardware intact. No acute bony abnormality. IMPRESSION: Total right knee replacement with anatomic alignment. Electronically Signed   By: Carrie [name]  Register   On: 04/30/2017 14:14    Disposition: to skilled nursing facility  Discharge Instructions    Discharge patient   Complete by:  As directed    Discharge disposition:  03-Skilled [facility]   Discharge patient date:  05/03/2017       Contact information for follow-up providers    Mcarthur Rossetti, MD Follow up in 2 week(s).   Specialty:  Orthopedic Surgery Contact information: [place] [address] [phone]             Contact information for after-discharge care    Destination    [facility] .   Service:  Skilled Nursing Contact information: [address] [place] [phone]                   Signed: Harrell Gave  Carrie [name] 05/03/2017, 6:55 AM

## 2017-05-03 NOTE — Progress Notes (Signed)
Patient will DC to: [facility] Anticipated DC date: 05/03/17 Family notified: At bedside Transport by: [facility] 12:30pm   Per MD patient ready for DC to [facility]. RN, patient, patient's family, and facility notified of DC. Discharge Summary sent to facility. RN given number for report [phone]). DC packet on chart. Ambulance transport requested for patient.   CSW signing off.  Cedric Fishman, [place] Social Worker [phone]

## 2017-05-03 NOTE — Progress Notes (Signed)
Patient ID: Carrie [name], female   DOB: December 10, 1950, 66 y.o.   MRN: [id number] Doing well overall.  Right knee stable.  Can be discharged to skilled nursing today.  Will send out on Coumadin 2.5 mg daily.

## 2017-05-03 NOTE — Care Management Important Message (Signed)
Important Message  Patient Details  Name: Carrie [name] MRN: [id number] Date of Birth: May 24, 1951   Medicare Important Message Given:  Yes    Virgen Belland Montine Circle 05/03/2017, 12:27 PM

## 2017-05-03 NOTE — Clinical Social Work Placement (Signed)
   CLINICAL SOCIAL WORK PLACEMENT  NOTE  Date:  05/03/2017  Patient Details  Name: Carrie [name] MRN: [id number] Date of Birth: 1951-03-04  Clinical Social Work is seeking post-discharge placement for this patient at the [place] level of care (*CSW will initial, date and re-position this form in  chart as items are completed):  Yes   Patient/family provided with [facility] Work Department's list of facilities offering this level of care within the geographic area requested by the patient (or if unable, by the patient's family).  Yes   Patient/family informed of their freedom to choose among providers that offer the needed level of care, that participate in Medicare, Medicaid or managed care program needed by the patient, have an available bed and are willing to accept the patient.  Yes   Patient/family informed of [facility]'s ownership interest in [facility] and [facility] as well as of the fact that they are under no obligation to receive care at these facilities.  PASRR submitted to EDS on 05/01/17     PASRR number received on 05/01/17     Existing PASRR number confirmed on       FL2 transmitted to all facilities in geographic area requested by pt/family on 05/01/17     FL2 transmitted to all facilities within larger geographic area on       Patient informed that his/her managed care company has contracts with or will negotiate with certain facilities, including the following:        Yes   Patient/family informed of bed offers received.  Patient chooses bed at [facility]     Physician recommends and patient chooses bed at      Patient to be transferred to [facility] on 05/03/17.  Patient to be transferred to facility by [facility]     Patient family notified on 05/03/17 of transfer.  Name of family member notified:  sister contacted     PHYSICIAN       Additional Comment:     _______________________________________________ Benard Halsted, [place] 05/03/2017, 10:54 AM

## 2017-05-03 NOTE — Telephone Encounter (Signed)
Last Rx 10/2016 #270 1R. Last OV 11/2016

## 2017-05-03 NOTE — Progress Notes (Signed)
Physical Therapy Treatment Patient Details Name: Carrie [name] MRN: [id number] DOB: 27-Oct-1950 Today's Date: 05/03/2017    History of Present Illness 66 y/o female s/p R TKA (04/30/17). PMH includes meningioma (s/p brain surgery), CKD, HTN, DMtype 2    PT Comments    Patient is making gradual progress toward mobility goals. Pt reported less pain with ROM this session. Current plan remains appropriate.    Follow Up Recommendations  SNF;Supervision/Assistance - 24 hour     Equipment Recommendations  None recommended by PT    Recommendations for Other Services       Precautions / Restrictions Precautions Precautions: Fall;Knee Precaution Comments: precautions/positioning reviewed with pt and pt's sister Restrictions Weight Bearing Restrictions: Yes RLE Weight Bearing: Weight bearing as tolerated    Mobility  Bed Mobility Overal bed mobility: Needs Assistance Bed Mobility: Supine to Sit     Supine to sit: Min guard     General bed mobility comments: min guard for safety; cues for technique and sequencing; pt able to use L LE to lower R LE from EOB; use of rails  Transfers Overall transfer level: Needs assistance Equipment used: Rolling walker (2 wheeled) Transfers: Sit to/from [facility] Sit to Stand: Min guard         General transfer comment: min guard for EOB and BSC; cues for safe hand placement and technique  Ambulation/Gait Ambulation/Gait assistance: Min guard Ambulation Distance (Feet): 20 Feet Assistive device: Rolling walker (2 wheeled) Gait Pattern/deviations: Step-to pattern;Antalgic;Step-through pattern;Decreased stance time - right;Decreased step length - left;Decreased step length - right Gait velocity: decreased   General Gait Details: cues for posture, proximity of RW, and increased R step length and heel strike   Stairs            Wheelchair Mobility    Modified Rankin (Stroke Patients Only)       Balance  Overall balance assessment: Needs assistance Sitting-balance support: No upper extremity supported;Feet supported Sitting balance-Leahy Scale: Good     Standing balance support: Bilateral upper extremity supported;During functional activity Standing balance-Leahy Scale: Poor Standing balance comment: heavy reliance on RW                            Cognition Arousal/Alertness: Awake/alert Behavior During Therapy: WFL for tasks assessed/performed Overall Cognitive Status: Within Functional Limits for tasks assessed                                        Exercises Total Joint Exercises Ankle Circles/Pumps: AROM;Both;10 reps Quad Sets: Right;10 reps;AROM Short Arc QuadSinclair Ship;Right;10 reps Heel Slides: AAROM;Right;10 reps Hip ABduction/ADduction: AAROM;Right;10 reps Goniometric ROM: approx 65 degrees flexion AAROM    General Comments General comments (skin integrity, edema, etc.): pt's sister present      Pertinent Vitals/Pain Pain Assessment: Faces Faces Pain Scale: Hurts little more(6 with therex) Pain Location: R knee Pain Descriptors / Indicators: Aching;Grimacing;Guarding Pain Intervention(s): Limited activity within patient's tolerance;Monitored during session;Premedicated before session;Repositioned    Home Living                      Prior Function            PT Goals (current goals can now be found in the care plan section) Acute Rehab PT Goals Patient Stated Goal: go to rehab then home PT Goal Formulation:  With patient Time For Goal Achievement: 05/15/17 Potential to Achieve Goals: Good Progress towards PT goals: Progressing toward goals    Frequency    7X/week      PT Plan Current plan remains appropriate    Co-evaluation              AM-PAC PT "6 Clicks" Daily Activity  Outcome Measure  Difficulty turning over in bed (including adjusting bedclothes, sheets and blankets)?: A Little Difficulty moving  from lying on back to sitting on the side of the bed? : Unable Difficulty sitting down on and standing up from a chair with arms (e.g., wheelchair, bedside commode, etc,.)?: Unable Help needed moving to and from a bed to chair (including a wheelchair)?: A Little Help needed walking in hospital room?: A Little Help needed climbing 3-5 steps with a railing? : A Little 6 Click Score: 14    End of Session Equipment Utilized During Treatment: Gait belt Activity Tolerance: Patient limited by fatigue Patient left: in chair;with family/visitor present;with call bell/phone within reach Nurse Communication: Mobility status PT Visit Diagnosis: Muscle weakness (generalized) (M62.81);Difficulty in walking, not elsewhere classified (R26.2);Pain Pain - Right/Left: Right Pain - part of body: Knee     Time: 5800-6349 PT Time Calculation (min) (ACUTE ONLY): 34 min  Charges:  $Gait Training: 8-22 mins $Therapeutic Exercise: 8-22 mins                    G Codes:       Carrie [name], PTA Pager: [phone]     Darliss Cheney 05/03/2017, 10:53 AM

## 2017-05-03 NOTE — Telephone Encounter (Signed)
Approved: #[id number] x 3

## 2017-05-05 DIAGNOSIS — M1711 Unilateral primary osteoarthritis, right knee: Secondary | ICD-10-CM | POA: Diagnosis not present

## 2017-05-05 DIAGNOSIS — Z96651 Presence of right artificial knee joint: Secondary | ICD-10-CM | POA: Diagnosis not present

## 2017-05-05 DIAGNOSIS — Z7901 Long term (current) use of anticoagulants: Secondary | ICD-10-CM | POA: Diagnosis not present

## 2017-05-05 DIAGNOSIS — E785 Hyperlipidemia, unspecified: Secondary | ICD-10-CM | POA: Diagnosis not present

## 2017-05-05 DIAGNOSIS — Z79891 Long term (current) use of opiate analgesic: Secondary | ICD-10-CM | POA: Diagnosis not present

## 2017-05-05 DIAGNOSIS — I10 Essential (primary) hypertension: Secondary | ICD-10-CM | POA: Diagnosis not present

## 2017-05-07 ENCOUNTER — Telehealth (INDEPENDENT_AMBULATORY_CARE_PROVIDER_SITE_OTHER): Payer: Self-pay | Admitting: Orthopaedic Surgery

## 2017-05-07 NOTE — Telephone Encounter (Signed)
Patient called stating that the wound care nurse at [facility] is wanting to take her bandage or dressing off her incision site.  Patient's sister stated that Dr. [name] had stated he wanted to take it off at her next visit.  CB#[phone].  Thank you.

## 2017-05-07 NOTE — Telephone Encounter (Signed)
IC patient and advised to leave the bandage on until next visit with [place], we will remove.

## 2017-05-10 ENCOUNTER — Encounter: Payer: Commercial Managed Care - HMO | Admitting: Internal Medicine

## 2017-05-11 DIAGNOSIS — D649 Anemia, unspecified: Secondary | ICD-10-CM | POA: Diagnosis not present

## 2017-05-11 DIAGNOSIS — Z7901 Long term (current) use of anticoagulants: Secondary | ICD-10-CM | POA: Diagnosis not present

## 2017-05-11 DIAGNOSIS — M1711 Unilateral primary osteoarthritis, right knee: Secondary | ICD-10-CM | POA: Diagnosis not present

## 2017-05-11 DIAGNOSIS — Z96651 Presence of right artificial knee joint: Secondary | ICD-10-CM | POA: Diagnosis not present

## 2017-05-11 DIAGNOSIS — E785 Hyperlipidemia, unspecified: Secondary | ICD-10-CM | POA: Diagnosis not present

## 2017-05-11 DIAGNOSIS — Z79891 Long term (current) use of opiate analgesic: Secondary | ICD-10-CM | POA: Diagnosis not present

## 2017-05-14 ENCOUNTER — Inpatient Hospital Stay (INDEPENDENT_AMBULATORY_CARE_PROVIDER_SITE_OTHER): Payer: Medicare HMO | Admitting: Orthopaedic Surgery

## 2017-05-15 ENCOUNTER — Ambulatory Visit (INDEPENDENT_AMBULATORY_CARE_PROVIDER_SITE_OTHER): Payer: Medicare HMO | Admitting: Orthopaedic Surgery

## 2017-05-15 ENCOUNTER — Encounter (INDEPENDENT_AMBULATORY_CARE_PROVIDER_SITE_OTHER): Payer: Self-pay | Admitting: Orthopaedic Surgery

## 2017-05-15 DIAGNOSIS — Z96651 Presence of right artificial knee joint: Secondary | ICD-10-CM

## 2017-05-15 NOTE — Progress Notes (Signed)
The patient is 2 weeks status post a right total knee arthroplasty.  She has been staying at a rehab center in [facility].  She is scheduled to go home this Friday to her home in [place].  She has been doing well.  She is ablating with a walker.  On exam she has almost full extension of her right knee to flexion to 90 degrees.  Her incision looks well-healed scar removed all the staples and Place Steri-Strips.  Calf is soft.  She is able to flex and extend at her foot and ankle.  She will continue to transition outpatient therapy when she is home I gave her prescription for outpatient physical therapy in [facility].  All questions and concerns were answered and addressed.  We will see her back in 4 weeks to see how she is doing clinically but no x-rays are needed.

## 2017-05-20 DIAGNOSIS — Z96651 Presence of right artificial knee joint: Secondary | ICD-10-CM | POA: Diagnosis not present

## 2017-05-20 DIAGNOSIS — M25661 Stiffness of right knee, not elsewhere classified: Secondary | ICD-10-CM | POA: Diagnosis not present

## 2017-05-20 DIAGNOSIS — M25561 Pain in right knee: Secondary | ICD-10-CM | POA: Diagnosis not present

## 2017-05-20 DIAGNOSIS — R262 Difficulty in walking, not elsewhere classified: Secondary | ICD-10-CM | POA: Diagnosis not present

## 2017-05-27 DIAGNOSIS — Z96651 Presence of right artificial knee joint: Secondary | ICD-10-CM | POA: Diagnosis not present

## 2017-05-27 DIAGNOSIS — R262 Difficulty in walking, not elsewhere classified: Secondary | ICD-10-CM | POA: Diagnosis not present

## 2017-05-27 DIAGNOSIS — M25561 Pain in right knee: Secondary | ICD-10-CM | POA: Diagnosis not present

## 2017-05-27 DIAGNOSIS — M25661 Stiffness of right knee, not elsewhere classified: Secondary | ICD-10-CM | POA: Diagnosis not present

## 2017-06-02 ENCOUNTER — Other Ambulatory Visit (INDEPENDENT_AMBULATORY_CARE_PROVIDER_SITE_OTHER): Payer: Self-pay | Admitting: Orthopaedic Surgery

## 2017-06-03 NOTE — Telephone Encounter (Signed)
Please advise 

## 2017-06-03 NOTE — Telephone Encounter (Signed)
She can stop Coumadin from my standpoint

## 2017-06-07 ENCOUNTER — Other Ambulatory Visit: Payer: Self-pay | Admitting: Internal Medicine

## 2017-06-12 ENCOUNTER — Encounter (INDEPENDENT_AMBULATORY_CARE_PROVIDER_SITE_OTHER): Payer: Self-pay | Admitting: Orthopaedic Surgery

## 2017-06-12 ENCOUNTER — Ambulatory Visit (INDEPENDENT_AMBULATORY_CARE_PROVIDER_SITE_OTHER): Payer: Medicare HMO | Admitting: Orthopaedic Surgery

## 2017-06-12 DIAGNOSIS — Z96651 Presence of right artificial knee joint: Secondary | ICD-10-CM

## 2017-06-12 MED ORDER — OXYCODONE HCL 5 MG PO CAPS: 10.0000 mg | ORAL_CAPSULE | Freq: Four times a day (QID) | ORAL | 0 refills | Status: DC | PRN

## 2017-06-12 MED ORDER — METHOCARBAMOL 500 MG PO TABS: 500.0000 mg | ORAL_TABLET | Freq: Four times a day (QID) | ORAL | 0 refills | Status: DC | PRN

## 2017-06-12 NOTE — Progress Notes (Signed)
The patient is 6 weeks status post a right total knee arthroplasty.  She stopped going to physical therapy because she could not afford the co-pay however she is been working aggressively on her right knee motion at home.  On exam her knee is fully extended.  I can flex her back to well past 90 degrees which is good given her obesity.  The knee feels ligaments are stable.  There is no significant effusion at all.  She will continue aggressive therapy on her own which she is already doing well with.  I did refill her Robaxin and oxycodone which she will start weaning from.  We will see her back in 3 months and at that visit we will have an AP and lateral of her right knee.

## 2017-06-14 ENCOUNTER — Other Ambulatory Visit (INDEPENDENT_AMBULATORY_CARE_PROVIDER_SITE_OTHER): Payer: Self-pay

## 2017-06-14 MED ORDER — TIZANIDINE HCL 4 MG PO TABS: 4.0000 mg | ORAL_TABLET | Freq: Four times a day (QID) | ORAL | 0 refills | Status: DC | PRN

## 2017-06-17 ENCOUNTER — Other Ambulatory Visit: Payer: Self-pay | Admitting: Internal Medicine

## 2017-06-17 DIAGNOSIS — Z1231 Encounter for screening mammogram for malignant neoplasm of breast: Secondary | ICD-10-CM

## 2017-06-21 ENCOUNTER — Encounter: Payer: Self-pay | Admitting: Internal Medicine

## 2017-06-21 ENCOUNTER — Ambulatory Visit (INDEPENDENT_AMBULATORY_CARE_PROVIDER_SITE_OTHER): Payer: Medicare HMO | Admitting: Internal Medicine

## 2017-06-21 VITALS — BP 120/70 | HR 80 | Temp 97.7°F | Wt 253.0 lb

## 2017-06-21 DIAGNOSIS — Z6841 Body Mass Index (BMI) 40.0 and over, adult: Secondary | ICD-10-CM | POA: Diagnosis not present

## 2017-06-21 DIAGNOSIS — N184 Chronic kidney disease, stage 4 (severe): Secondary | ICD-10-CM

## 2017-06-21 DIAGNOSIS — F39 Unspecified mood [affective] disorder: Secondary | ICD-10-CM | POA: Diagnosis not present

## 2017-06-21 DIAGNOSIS — M1711 Unilateral primary osteoarthritis, right knee: Secondary | ICD-10-CM | POA: Diagnosis not present

## 2017-06-21 DIAGNOSIS — E1121 Type 2 diabetes mellitus with diabetic nephropathy: Secondary | ICD-10-CM

## 2017-06-21 NOTE — Addendum Note (Signed)
Addended by: Ellamae Sia on: 06/21/2017 10:05 AM   Modules accepted: Orders

## 2017-06-21 NOTE — Assessment & Plan Note (Signed)
Has had good spirits lately Near 5th anniversary of daughter's suicide Discussed our faith--she stays strong

## 2017-06-21 NOTE — Progress Notes (Signed)
Subjective:    Patient ID: Carrie [name], female    DOB: 25-Nov-1950, 67 y.o.   MRN: [id number]  HPI Here for follow up---after right TKR and for other chronic health conditions With sister Surgery went well--then [facility] for rehab Back to driving. Walks in house without walker Still needs the oxycodone at times  Has lost some weight Watching her eating--"fasting" program through church (very healthy) Doing her own exercises now--couldn't afford ongoing therapy  Hasn't checked sugars Eating healthy Lab Results  Component Value Date   HGBA1C 6.7 (H) 04/22/2017   No chest pain No SOB No edema Recent slight dizzy spell--only after bending down to pick up package  Reviewed blood work in hospital GFR under 30 at times Going back to Dr Candiss Norse next month  Current Outpatient Medications on File Prior to Visit  Medication Sig Dispense Refill  . acetaminophen-codeine (TYLENOL #3) 300-30 MG tablet Take 1-2 tablets every 6 (six) hours as needed by mouth for moderate pain.    [name] amLODipine (NORVASC) 10 MG tablet TAKE 1 TABLET BY MOUTH EVERY DAY 90 tablet 3  . atorvastatin (LIPITOR) 40 MG tablet TAKE ONE TABLET BY MOUTH ONE TIME DAILY (Patient taking differently: Take 40 mg by mouth once daily) 90 tablet 3  . gabapentin (NEURONTIN) 100 MG capsule TAKE 1 CAPSULE BY MOUTH THREE TIMES A DAY 270 capsule 1  . levETIRAcetam (KEPPRA) 1000 MG tablet TAKE ONE TABLET BY MOUTH TWICE DAILY (Patient taking differently: TAKE 1000 MG BY MOUTH TWICE DAILY) 180 tablet 3  . LORazepam (ATIVAN) 0.5 MG tablet TAKE 1/2-1 TABLET BY MOUTH 3 TIMES A DAY AS NEEDED (Patient taking differently: TAKE 0.25-0.5 MG BY MOUTH 3 TIMES A DAY AS NEEDED FOR ANXIETY) 60 tablet 0  . losartan (COZAAR) 50 MG tablet Take 50 mg by mouth daily.    . methocarbamol (ROBAXIN) 500 MG tablet Take 1 tablet (500 mg total) by mouth every 6 (six) hours as needed for muscle spasms. 60 tablet 0  . oxycodone (OXY-IR) 5 MG capsule Take 2  capsules (10 mg total) by mouth every 6 (six) hours as needed. 60 capsule 0  . tiZANidine (ZANAFLEX) 4 MG tablet Take 1 tablet (4 mg total) by mouth every 6 (six) hours as needed for muscle spasms. 60 tablet 0  . Vitamin D, Ergocalciferol, (DRISDOL) 50000 units CAPS capsule TAKE 1 CAPSULE BY MOUTH ONCE A MONTH (Patient taking differently: Take 50,000 Units every 30 (thirty) days by mouth. ) 3 capsule 3   No current facility-administered medications on file prior to visit.     Allergies  Allergen Reactions  . Naproxen Sodium Anaphylaxis and Swelling  . Sulfamethoxazole-Trimethoprim Anaphylaxis and Swelling    Past Medical History:  Diagnosis Date  . Allergy   . Arthritis    "right knee" (10/26'/2017)  . CKD (chronic kidney disease), stage III ([place])    12/2015 (Dr. Lavonia Dana)  . Depression   . History of blood transfusion 2008   "when I had brain tumor surgery"  . History of MRSA infection 2008  . Hypertension   . 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 Type II diabetes mellitus ([place]) 2011   on metformin until yr ago- no med now - off due to kidneys    Past Surgical History:  Procedure Laterality Date  . BRAIN MENINGIOMA EXCISION  08/2006   Foramina magnum meningioma  . CERVICAL DISCECTOMY  1996   Dr. Alric Seton  . CESAREAN SECTION  1981  .  COLONOSCOPY    . DILATION AND CURETTAGE OF UTERUS    . JOINT REPLACEMENT    . Right wrist x-ray  2007   Deg. at 1st MCP  . TOTAL HIP ARTHROPLASTY Left 03/27/2016   Procedure: LEFT TOTAL HIP ARTHROPLASTY ANTERIOR APPROACH;  Surgeon: Mcarthur Rossetti, MD;  Location: [place];  Service: Orthopedics;  Laterality: Left;  . TOTAL KNEE ARTHROPLASTY Right 04/30/2017   Procedure: RIGHT TOTAL KNEE ARTHROPLASTY;  Surgeon: Mcarthur Rossetti, MD;  Location: [place];  Service: Orthopedics;  Laterality: Right;  . TUBAL LIGATION  1981    Family History  Problem Relation Age of Onset  . Hypertension Other        Strong family history   .  Breast cancer Mother 19    Social History   Socioeconomic History  . Marital status: Widowed    Spouse name: Not on file  . Number of children: 1  . Years of education: Not on file  . Highest education level: Not on file  Social Needs  . Financial resource strain: Not on file  . Food insecurity - worry: Not on file  . Food insecurity - inability: Not on file  . Transportation needs - medical: Not on file  . Transportation needs - non-medical: Not on file  Occupational History  . 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 Occupation: Disabled since brain surgery  Tobacco Use  . Smoking status: Never Smoker  . Smokeless tobacco: Never Used  Substance and Sexual Activity  . Alcohol use: No    Alcohol/week: 0.0 oz  . Drug use: No  . Sexual activity: No  Other Topics Concern  . Not on file  Social History Narrative   Lives in family home with extended family.      No living will   Requests niece, [name], as health care POA   Would accept resuscitation attempts   No tube feeds if cognitively aware   Review of Systems Asks about the potassium pill--was on this with fluid pills after surgery Discussed stopping the potassium Sleeps "so-so"    Objective:   Physical Exam  Constitutional: No distress.  Neck: No thyromegaly present.  Cardiovascular: Normal rate, regular rhythm and normal heart sounds. Exam reveals no gallop.  No murmur heard. Pulmonary/Chest: Effort normal and breath sounds normal. No respiratory distress. She has no wheezes. She has no rales.  Musculoskeletal: She exhibits no edema.  Lymphadenopathy:    She has no cervical adenopathy.  Psychiatric: She has a normal mood and affect. Her behavior is normal.          Assessment & Plan:

## 2017-06-21 NOTE — Assessment & Plan Note (Signed)
Improved eating and has lost weight

## 2017-06-21 NOTE — Assessment & Plan Note (Signed)
Has done great post op Some pain but good function only 6 weeks out

## 2017-06-21 NOTE — Assessment & Plan Note (Signed)
Eating better and weight down Will recheck soon

## 2017-06-21 NOTE — Assessment & Plan Note (Signed)
Worsening may just be post op Will recheck today and has follow up with Dr Candiss Norse

## 2017-06-24 ENCOUNTER — Other Ambulatory Visit: Payer: Medicare HMO

## 2017-06-26 ENCOUNTER — Telehealth: Payer: Self-pay | Admitting: Internal Medicine

## 2017-06-26 NOTE — Telephone Encounter (Signed)
Spoke to pt

## 2017-06-26 NOTE — Telephone Encounter (Signed)
Let her know that should be fine

## 2017-06-26 NOTE — Telephone Encounter (Signed)
Copied from [place] [id number]. Topic: Quick Communication - See Telephone Encounter >> Jun 26, 2017 11:02 AM Arletha Grippe wrote: CRM for notification. See Telephone encounter for:   06/26/17. Pt called - she has to cancel her lab appt due to taking care of her sister and wants to make sure it is ok to wait to have labs done unitl she sees her kidney doctor on 07/08/17. If not please call [phone]

## 2017-06-28 ENCOUNTER — Other Ambulatory Visit (INDEPENDENT_AMBULATORY_CARE_PROVIDER_SITE_OTHER): Payer: Self-pay | Admitting: Radiology

## 2017-06-28 MED ORDER — TIZANIDINE HCL 4 MG PO TABS: 4.0000 mg | ORAL_TABLET | Freq: Four times a day (QID) | ORAL | 0 refills | Status: DC | PRN

## 2017-06-29 ENCOUNTER — Other Ambulatory Visit: Payer: Self-pay | Admitting: Internal Medicine

## 2017-07-01 ENCOUNTER — Other Ambulatory Visit: Payer: Medicare HMO

## 2017-07-08 DIAGNOSIS — D631 Anemia in chronic kidney disease: Secondary | ICD-10-CM | POA: Diagnosis not present

## 2017-07-08 DIAGNOSIS — E1122 Type 2 diabetes mellitus with diabetic chronic kidney disease: Secondary | ICD-10-CM | POA: Diagnosis not present

## 2017-07-08 DIAGNOSIS — R809 Proteinuria, unspecified: Secondary | ICD-10-CM | POA: Diagnosis not present

## 2017-07-08 DIAGNOSIS — I129 Hypertensive chronic kidney disease with stage 1 through stage 4 chronic kidney disease, or unspecified chronic kidney disease: Secondary | ICD-10-CM | POA: Diagnosis not present

## 2017-07-08 DIAGNOSIS — N183 Chronic kidney disease, stage 3 (moderate): Secondary | ICD-10-CM | POA: Diagnosis not present

## 2017-07-08 LAB — CBC AND DIFFERENTIAL
HEMATOCRIT: 32 — AB (ref 36–46)
HEMOGLOBIN: 10.2 — AB (ref 12.0–16.0)

## 2017-07-08 LAB — BASIC METABOLIC PANEL
BUN: 36 — AB (ref 4–21)
Creatinine: 2.3 — AB (ref 0.5–1.1)

## 2017-07-10 ENCOUNTER — Other Ambulatory Visit: Payer: Self-pay | Admitting: Internal Medicine

## 2017-07-10 NOTE — Telephone Encounter (Signed)
Last filled 01-03-17 #60 Last OV 06-21-17 Next OV 12-20-17

## 2017-07-18 ENCOUNTER — Ambulatory Visit
Admission: RE | Admit: 2017-07-18 | Discharge: 2017-07-18 | Disposition: A | Discharge status: Home / Self Care | Payer: Medicare HMO | Source: Ambulatory Visit | Attending: Internal Medicine | Admitting: Internal Medicine

## 2017-07-18 DIAGNOSIS — Z1231 Encounter for screening mammogram for malignant neoplasm of breast: Secondary | ICD-10-CM | POA: Diagnosis not present

## 2017-07-23 ENCOUNTER — Encounter: Payer: Self-pay | Admitting: Internal Medicine

## 2017-08-07 ENCOUNTER — Ambulatory Visit (INDEPENDENT_AMBULATORY_CARE_PROVIDER_SITE_OTHER): Payer: Medicare HMO

## 2017-08-07 DIAGNOSIS — Z111 Encounter for screening for respiratory tuberculosis: Secondary | ICD-10-CM | POA: Diagnosis not present

## 2017-08-09 LAB — TB SKIN TEST
Induration: 0 mm
TB Skin Test: NEGATIVE

## 2017-08-13 ENCOUNTER — Ambulatory Visit (INDEPENDENT_AMBULATORY_CARE_PROVIDER_SITE_OTHER): Payer: Medicare HMO | Admitting: Internal Medicine

## 2017-08-13 ENCOUNTER — Encounter: Payer: Self-pay | Admitting: Internal Medicine

## 2017-08-13 VITALS — BP 124/76 | HR 84 | Temp 98.2°F | Wt 254.0 lb

## 2017-08-13 DIAGNOSIS — J0140 Acute pansinusitis, unspecified: Secondary | ICD-10-CM | POA: Insufficient documentation

## 2017-08-13 MED ORDER — AMOXICILLIN 500 MG PO TABS: 1000.0000 mg | ORAL_TABLET | Freq: Two times a day (BID) | ORAL | 0 refills | Status: AC

## 2017-08-13 NOTE — Progress Notes (Signed)
Subjective:    Patient ID: Carrie [name], female    DOB: May 15, 1951, 67 y.o.   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 amLODipine (NORVASC) 10 MG tablet TAKE 1 TABLET BY MOUTH EVERY DAY 90 tablet 3  . atorvastatin (LIPITOR) 40 MG tablet TAKE ONE TABLET BY MOUTH ONE TIME DAILY (Patient taking differently: Take 40 mg by mouth once daily) 90 tablet 3  . gabapentin (NEURONTIN) 100 MG capsule TAKE 1 CAPSULE BY MOUTH THREE TIMES A DAY 270 capsule 1  . levETIRAcetam (KEPPRA) 1000 MG tablet TAKE ONE TABLET BY MOUTH TWICE DAILY 180 tablet 3  . LORazepam (ATIVAN) 0.5 MG tablet TAKE 1/2 TO 1 TABLET BY MOUTH 3 TIMES A DAY AS NEEDED 60 tablet 0  . losartan (COZAAR) 50 MG tablet Take 50 mg by mouth daily.    [name] tiZANidine (ZANAFLEX) 4 MG tablet Take 1 tablet (4 mg total) by mouth every 6 (six) hours as needed for muscle spasms. 180 tablet 0  . Vitamin D, Ergocalciferol, (DRISDOL) 50000 units CAPS capsule TAKE 1 CAPSULE BY MOUTH ONCE A MONTH (Patient taking differently: Take 50,000 Units every 30 (thirty) days by mouth. ) 3 capsule 3   No current facility-administered medications on file prior to visit.     Allergies  Allergen Reactions  . Naproxen Sodium Anaphylaxis and Swelling  . Sulfamethoxazole-Trimethoprim Anaphylaxis and Swelling    Past Medical History:  Diagnosis Date  . Allergy   . Arthritis    "right knee" (10/26'/2017)  . CKD (chronic kidney disease), stage III ([place])    12/2015 (Dr. Lavonia Dana)  . Depression   . History of blood transfusion 2008   "when I had  brain tumor surgery"  . History of MRSA infection 2008  . Hypertension   . 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 Type II diabetes mellitus ([place]) 2011   on metformin until yr ago- no med now - off due to kidneys    Past Surgical History:  Procedure Laterality Date  . BRAIN MENINGIOMA EXCISION  08/2006   Foramina magnum meningioma  . CERVICAL DISCECTOMY  1996   Dr. Alric Seton  . CESAREAN SECTION  1981  . COLONOSCOPY    . DILATION AND CURETTAGE OF UTERUS    . JOINT REPLACEMENT    . Right wrist x-ray  2007   Deg. at 1st MCP  . TOTAL HIP ARTHROPLASTY Left 03/27/2016   Procedure: LEFT TOTAL HIP ARTHROPLASTY ANTERIOR APPROACH;  Surgeon: Mcarthur Rossetti, MD;  Location: [place];  Service: Orthopedics;  Laterality: Left;  . TOTAL KNEE ARTHROPLASTY Right 04/30/2017   Procedure: RIGHT TOTAL KNEE ARTHROPLASTY;  Surgeon: Mcarthur Rossetti, MD;  Location: [place];  Service: Orthopedics;  Laterality: Right;  . TUBAL LIGATION  1981    Family History  Problem Relation Age of Onset  . Hypertension Other        Strong family history   . Breast cancer Mother 32    Social History   Socioeconomic History  . Marital status:  Widowed    Spouse name: Not on file  . Number of children: 1  . Years of education: Not on file  . Highest education level: Not on file  Social Needs  . Financial resource strain: Not on file  . Food insecurity - worry: Not on file  . Food insecurity - inability: Not on file  . Transportation needs - medical: Not on file  . Transportation needs - non-medical: Not on file  Occupational History  . 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 Occupation: Disabled since brain surgery  Tobacco Use  . Smoking status: Never Smoker  . Smokeless tobacco: Never Used  Substance and Sexual Activity  . Alcohol use: No    Alcohol/week: 0.0 oz  . Drug use: No  . Sexual activity: No  Other Topics Concern  . Not on file  Social History Narrative   Lives in  family home with extended family.      No living will   Requests niece, [name], as health care POA   Would accept resuscitation attempts   No tube feeds if cognitively aware   Review of Systems Some dry mouth Appetite is fair No vision changes--but needs eye exam    Objective:   Physical Exam  Constitutional: No distress.  HENT:  Mouth/Throat: Oropharynx is clear and moist. No oropharyngeal exudate.  Frontal and maxillary tenderness Marked nasal swelling   Neck: No thyromegaly present.  Pulmonary/Chest: Effort normal and breath sounds normal. No respiratory distress. She has no wheezes. She has no rales.  Lymphadenopathy:    She has no cervical adenopathy.          Assessment & Plan:

## 2017-08-13 NOTE — Assessment & Plan Note (Signed)
Asked her to continue tylenol Start her flonase If worsens, try the amoxil

## 2017-08-21 ENCOUNTER — Other Ambulatory Visit (INDEPENDENT_AMBULATORY_CARE_PROVIDER_SITE_OTHER): Payer: Self-pay | Admitting: Orthopaedic Surgery

## 2017-08-21 NOTE — Telephone Encounter (Signed)
Please advise 

## 2017-09-04 ENCOUNTER — Other Ambulatory Visit: Payer: Self-pay | Admitting: Internal Medicine

## 2017-09-05 NOTE — Telephone Encounter (Signed)
Last filled 07-10-17 #60  Last OV 08-13-17 Next OV 12-20-17

## 2017-09-10 ENCOUNTER — Encounter (INDEPENDENT_AMBULATORY_CARE_PROVIDER_SITE_OTHER): Payer: Self-pay | Admitting: Orthopaedic Surgery

## 2017-09-10 ENCOUNTER — Ambulatory Visit (INDEPENDENT_AMBULATORY_CARE_PROVIDER_SITE_OTHER): Payer: Medicare HMO

## 2017-09-10 ENCOUNTER — Ambulatory Visit (INDEPENDENT_AMBULATORY_CARE_PROVIDER_SITE_OTHER): Payer: Medicare HMO | Admitting: Orthopaedic Surgery

## 2017-09-10 DIAGNOSIS — Z96651 Presence of right artificial knee joint: Secondary | ICD-10-CM | POA: Diagnosis not present

## 2017-09-10 MED ORDER — TRAMADOL HCL 50 MG PO TABS: 50.0000 mg | ORAL_TABLET | Freq: Four times a day (QID) | ORAL | 0 refills | Status: DC | PRN

## 2017-09-10 NOTE — Progress Notes (Signed)
The patient is now 4 months status post a right total knee arthroplasty.  She is 67 years old and is very pleasant.  She is moderately obese with a BMI of 45.  She still having significant pain with the right knee but says her range of motion and strength are improving.  On exam her right knee shows no significant effusion.  Her extension is full and her flexion is to past 90 degrees.  The knee feels ligaments is stable.  Her incisions well-healed.  X-rays confirm a well-seated implant on the right side with no complicating features.  This point I gave her reassurance that her pain is legitimate and should hopefully improve the further she gets out from surgery.  I did send in some tramadol for her.  All questions concerns were answered and addressed.  I will see her back in 6 months with a repeat AP and lateral of her right knee.

## 2017-09-30 ENCOUNTER — Other Ambulatory Visit: Payer: Self-pay | Admitting: Internal Medicine

## 2017-09-30 ENCOUNTER — Ambulatory Visit: Payer: Self-pay | Admitting: *Deleted

## 2017-09-30 NOTE — Telephone Encounter (Signed)
I did not see where we had done Vit D labs on her.

## 2017-09-30 NOTE — Telephone Encounter (Signed)
Patient states she was seen a month ago and treated for URI with antibiotic. Patient states her symptoms did improve. She did not have the cough at that time. Patient states she has a dry cough and it is affecting her breathing- she is coughing at night so severely she finds it hard to breath. Patient refused UC and wants to wait for appointment in morning. Patient scheduled with instructions to go to ED if her symptoms change during the evening. Patient voices understanding.  Reason for Disposition . [1] Continuous (nonstop) coughing interferes with work or school AND [2] no improvement using cough treatment per protocol  Answer Assessment - Initial Assessment Questions 1. ONSET: "When did the cough begin?"      1 week- this time it is worse- patient can't stop 2. SEVERITY: "How bad is the cough today?"      Patient is getting to the point that she can't catch her breathe. 3. RESPIRATORY DISTRESS: "Describe your breathing."      Patient can't catch her breath during coughing and she reports she is wheezing 4. FEVER: "Do you have a fever?" If so, ask: "What is your temperature, how was it measured, and when did it start?"     no 5. HEMOPTYSIS: "Are you coughing up any blood?" If so ask: "How much?" (flecks, streaks, tablespoons, etc.)     Patient is not moving any secretions up 6. TREATMENT: "What have you done so far to treat the cough?" (e.g., meds, fluids, humidifier)     Benadryl, hard candy, flonase 7. CARDIAC HISTORY: "Do you have any history of heart disease?" (e.g., heart attack, congestive heart failure)      High blood pressure 8. LUNG HISTORY: "Do you have any history of lung disease?"  (e.g., pulmonary embolus, asthma, emphysema)     no 9. PE RISK FACTORS: "Do you have a history of blood clots?" (or: recent major surgery, recent prolonged travel, bedridden )     no 10. OTHER SYMPTOMS: "Do you have any other symptoms? (e.g., runny nose, wheezing, chest pain)       Wheezing, runny  nose 11. PREGNANCY: "Is there any chance you are pregnant?" "When was your last menstrual period?"       n/a 12. TRAVEL: "Have you traveled out of the country in the last month?" (e.g., travel history, exposures)       n/a  Protocols used: COUGH - ACUTE NON-PRODUCTIVE-A-AH

## 2017-09-30 NOTE — Telephone Encounter (Signed)
Pt has appt with Dr Silvio Pate 10/01/17 at 11:15.

## 2017-09-30 NOTE — Telephone Encounter (Signed)
Approved: #3 x 3 I will often prescribe this even without levels

## 2017-09-30 NOTE — Telephone Encounter (Signed)
We can discuss her concerns at tomorrow's visit

## 2017-10-01 ENCOUNTER — Ambulatory Visit (INDEPENDENT_AMBULATORY_CARE_PROVIDER_SITE_OTHER)
Admission: RE | Admit: 2017-10-01 | Discharge: 2017-10-01 | Disposition: A | Discharge status: Home / Self Care | Payer: Medicare HMO | Source: Ambulatory Visit | Attending: Internal Medicine | Admitting: Internal Medicine

## 2017-10-01 ENCOUNTER — Encounter: Payer: Self-pay | Admitting: Internal Medicine

## 2017-10-01 ENCOUNTER — Ambulatory Visit (INDEPENDENT_AMBULATORY_CARE_PROVIDER_SITE_OTHER): Payer: Medicare HMO | Admitting: Internal Medicine

## 2017-10-01 VITALS — BP 140/78 | HR 94 | Temp 98.3°F | Resp 24 | Ht 63.0 in | Wt 263.0 lb

## 2017-10-01 DIAGNOSIS — R05 Cough: Secondary | ICD-10-CM | POA: Insufficient documentation

## 2017-10-01 DIAGNOSIS — R053 Chronic cough: Secondary | ICD-10-CM | POA: Insufficient documentation

## 2017-10-01 DIAGNOSIS — I509 Heart failure, unspecified: Secondary | ICD-10-CM | POA: Diagnosis not present

## 2017-10-01 DIAGNOSIS — R059 Cough, unspecified: Secondary | ICD-10-CM | POA: Insufficient documentation

## 2017-10-01 LAB — CBC
HEMATOCRIT: 29.9 % — AB (ref 36.0–46.0)
Hemoglobin: 9.6 g/dL — ABNORMAL LOW (ref 12.0–15.0)
MCHC: 32.2 g/dL (ref 30.0–36.0)
MCV: 81.6 fl (ref 78.0–100.0)
Platelets: 273 10*3/uL (ref 150.0–400.0)
RBC: 3.67 Mil/uL — ABNORMAL LOW (ref 3.87–5.11)
RDW: 19.1 % — AB (ref 11.5–15.5)
WBC: 6.9 10*3/uL (ref 4.0–10.5)

## 2017-10-01 LAB — RENAL FUNCTION PANEL
ALBUMIN: 3.9 g/dL (ref 3.5–5.2)
BUN: 34 mg/dL — AB (ref 6–23)
CALCIUM: 8.9 mg/dL (ref 8.4–10.5)
CHLORIDE: 107 meq/L (ref 96–112)
CO2: 26 meq/L (ref 19–32)
CREATININE: 2.92 mg/dL — AB (ref 0.40–1.20)
GFR: 20.56 mL/min — ABNORMAL LOW (ref >60.00)
Glucose, Bld: 105 mg/dL — ABNORMAL HIGH (ref 70–99)
PHOSPHORUS: 4.5 mg/dL (ref 2.3–4.6)
Potassium: 4.7 mEq/L (ref 3.5–5.1)
Sodium: 141 mEq/L (ref 135–145)

## 2017-10-01 MED ORDER — AZITHROMYCIN 250 MG PO TABS: ORAL_TABLET | ORAL | 0 refills | Status: DC

## 2017-10-01 MED ORDER — FUROSEMIDE 40 MG PO TABS: 40.0000 mg | ORAL_TABLET | Freq: Every day | ORAL | 3 refills | Status: DC

## 2017-10-01 NOTE — Assessment & Plan Note (Signed)
Not clear that this is infectious but some purulent sputum and question of LLL infiltrate on CXR Will give z-pak--just in case I think her symptoms are mostly CHF

## 2017-10-01 NOTE — Patient Instructions (Addendum)
Please start the furosemide today---1 tab. If you do not pee a lot after that, increase it to 2 at a time every morning. Otherwise, continue 1 tab daily every day Take the z-pak also--just in case there is any infection.

## 2017-10-01 NOTE — Progress Notes (Signed)
Subjective:    Patient ID: Carrie [name], female    DOB: 1950/08/27, 67 y.o.   MRN: [id number]  HPI Here with sister due to ongoing respiratory symptoms  Felt better after last visit and the antibiotics Now, having cough and chest congestion for 2 weeks SOB started before the congestion Some wheezing noted as well Easy DOE now--new Some head congestion  No fever No chills or sweats--but feels cold Cough productive of clear mucus---but occasionally green (especially this AM)  No chest pain Heart races just walking out to yard (and skips) No edema  Current Outpatient Medications on File Prior to Visit  Medication Sig Dispense Refill  . amLODipine (NORVASC) 10 MG tablet TAKE 1 TABLET BY MOUTH EVERY DAY 90 tablet 3  . atorvastatin (LIPITOR) 40 MG tablet TAKE ONE TABLET BY MOUTH ONE TIME DAILY (Patient taking differently: Take 40 mg by mouth once daily) 90 tablet 3  . gabapentin (NEURONTIN) 100 MG capsule TAKE 1 CAPSULE BY MOUTH THREE TIMES A DAY 270 capsule 3  . levETIRAcetam (KEPPRA) 1000 MG tablet TAKE ONE TABLET BY MOUTH TWICE DAILY 180 tablet 3  . LORazepam (ATIVAN) 0.5 MG tablet TAKE 1/2 TO 1 TABLET BY MOUTH 3 TIMES A DAY AS NEEDED 60 tablet 0  . losartan (COZAAR) 50 MG tablet Take 50 mg by mouth daily.    [name] tiZANidine (ZANAFLEX) 4 MG tablet TAKE 1 TABLET (4 MG TOTAL) BY MOUTH EVERY 6 (SIX) HOURS AS NEEDED FOR MUSCLE SPASMS. 180 tablet 0  . traMADol (ULTRAM) 50 MG tablet Take 1-2 tablets (50-100 mg total) by mouth every 6 (six) hours as needed. 60 tablet 0  . Vitamin D, Ergocalciferol, (DRISDOL) 50000 units CAPS capsule TAKE 1 CAPSULE BY MOUTH ONCE A MONTH 3 capsule 3   No current facility-administered medications on file prior to visit.     Allergies  Allergen Reactions  . Naproxen Sodium Anaphylaxis and Swelling  . Sulfamethoxazole-Trimethoprim Anaphylaxis and Swelling    Past Medical History:  Diagnosis Date  . Allergy   . Arthritis    "right knee"  (10/26'/2017)  . CKD (chronic kidney disease), stage III ([place])    12/2015 (Dr. Lavonia Dana)  . Depression   . History of blood transfusion 2008   "when I had brain tumor surgery"  . History of MRSA infection 2008  . Hypertension   . 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 Type II diabetes mellitus ([place]) 2011   on metformin until yr ago- no med now - off due to kidneys    Past Surgical History:  Procedure Laterality Date  . BRAIN MENINGIOMA EXCISION  08/2006   Foramina magnum meningioma  . CERVICAL DISCECTOMY  1996   Dr. Alric Seton  . CESAREAN SECTION  1981  . COLONOSCOPY    . DILATION AND CURETTAGE OF UTERUS    . JOINT REPLACEMENT    . Right wrist x-ray  2007   Deg. at 1st MCP  . TOTAL HIP ARTHROPLASTY Left 03/27/2016   Procedure: LEFT TOTAL HIP ARTHROPLASTY ANTERIOR APPROACH;  Surgeon: Mcarthur Rossetti, MD;  Location: [place];  Service: Orthopedics;  Laterality: Left;  . TOTAL KNEE ARTHROPLASTY Right 04/30/2017   Procedure: RIGHT TOTAL KNEE ARTHROPLASTY;  Surgeon: Mcarthur Rossetti, MD;  Location: [facility];  Service: Orthopedics;  Laterality: Right;  . TUBAL LIGATION  1981    Family History  Problem Relation Age of Onset  . Hypertension Other        Strong  family history   . Breast cancer Mother 68    Social History   Socioeconomic History  . Marital status: Widowed    Spouse name: Not on file  . Number of children: 1  . Years of education: Not on file  . Highest education level: Not on file  Occupational History  . 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 Occupation: Disabled since brain surgery  Social Needs  . Financial resource strain: Not on file  . Food insecurity:    Worry: Not on file    Inability: Not on file  . Transportation needs:    Medical: Not on file    Non-medical: Not on file  Tobacco Use  . Smoking status: Never Smoker  . Smokeless tobacco: Never Used  Substance and Sexual Activity  . Alcohol use: No    Alcohol/week:  0.0 oz  . Drug use: No  . Sexual activity: Never  Lifestyle  . Physical activity:    Days per week: Not on file    Minutes per session: Not on file  . Stress: Not on file  Relationships  . Social connections:    Talks on phone: Not on file    Gets together: Not on file    Attends religious service: Not on file    Active member of club or organization: Not on file    Attends meetings of clubs or organizations: Not on file    Relationship status: Not on file  . Intimate partner violence:    Fear of current or ex partner: Not on file    Emotionally abused: Not on file    Physically abused: Not on file    Forced sexual activity: Not on file  Other Topics Concern  . Not on file  Social History Narrative   Lives in family home with extended family.      No living will   Requests niece, [name], as health care POA   Would accept resuscitation attempts   No tube feeds if cognitively aware   Review of Systems Trouble sleeping Sleeps in chair and upright. No PND Weight is up 9# since last visit    Objective:   Physical Exam  Constitutional: She appears well-developed. No distress.  HENT:  Mouth/Throat: Oropharynx is clear and moist. No oropharyngeal exudate.  TMs okay No sinus tenderness Mild nasal congestion  Neck: JVD present. No thyromegaly present.  Cardiovascular: Normal rate, regular rhythm and normal heart sounds. Exam reveals no friction rub.  No murmur heard. Pulmonary/Chest:  SOB when lies down Slightly decreased breath sounds--though no dullness No crackles  Abdominal: Soft. She exhibits no mass.  Musculoskeletal:  Trace edema bilaterally  Lymphadenopathy:    She has no cervical adenopathy.          Assessment & Plan:

## 2017-10-01 NOTE — Assessment & Plan Note (Signed)
9# weight gain, severe DOE, very big heart on CXR with likely vascular congestion No piror diagnosis  Will diurese with furosemide Recheck renal function Check echo ASAP Will set up with cardiology Hopefully it is diastolic If systolic, will likely need a cardiac cath

## 2017-10-02 ENCOUNTER — Telehealth: Payer: Self-pay | Admitting: Internal Medicine

## 2017-10-02 ENCOUNTER — Other Ambulatory Visit: Payer: Self-pay

## 2017-10-02 ENCOUNTER — Ambulatory Visit (HOSPITAL_COMMUNITY): Payer: Medicare HMO | Attending: Cardiology

## 2017-10-02 DIAGNOSIS — E669 Obesity, unspecified: Secondary | ICD-10-CM | POA: Insufficient documentation

## 2017-10-02 DIAGNOSIS — I509 Heart failure, unspecified: Secondary | ICD-10-CM

## 2017-10-02 DIAGNOSIS — Z6841 Body Mass Index (BMI) 40.0 and over, adult: Secondary | ICD-10-CM | POA: Insufficient documentation

## 2017-10-02 DIAGNOSIS — I130 Hypertensive heart and chronic kidney disease with heart failure and stage 1 through stage 4 chronic kidney disease, or unspecified chronic kidney disease: Secondary | ICD-10-CM | POA: Insufficient documentation

## 2017-10-02 DIAGNOSIS — N189 Chronic kidney disease, unspecified: Secondary | ICD-10-CM | POA: Insufficient documentation

## 2017-10-02 DIAGNOSIS — I313 Pericardial effusion (noninflammatory): Secondary | ICD-10-CM | POA: Diagnosis not present

## 2017-10-02 DIAGNOSIS — E1122 Type 2 diabetes mellitus with diabetic chronic kidney disease: Secondary | ICD-10-CM | POA: Insufficient documentation

## 2017-10-02 MED ORDER — HYDROCODONE-HOMATROPINE 5-1.5 MG/5ML PO SYRP: 5.0000 mL | ORAL_SOLUTION | Freq: Every evening | ORAL | 0 refills | Status: DC | PRN

## 2017-10-02 NOTE — Telephone Encounter (Signed)
Spoke to pt. She was very thankful.

## 2017-10-02 NOTE — Telephone Encounter (Signed)
Let her know I sent a prescription for a narcotic cough syrup. She should use it at night---and can repeat the dose once if needed

## 2017-10-02 NOTE — Telephone Encounter (Signed)
Copied from [place]. Topic: Quick Communication - See Telephone Encounter >> Oct 02, 2017  2:23 PM [name], Carrie Emperor wrote: CRM for notification. See Telephone encounter for: 10/02/17.  Pt states that she has been up all night coughing. She would like to know if Dr. Silvio Pate would call her in something for the coughing.  [id number] - [place], [place] - [address] [phone] (Phone) [phone] (Fax)

## 2017-10-17 ENCOUNTER — Other Ambulatory Visit: Payer: Medicare HMO

## 2017-10-23 ENCOUNTER — Other Ambulatory Visit: Payer: Self-pay | Admitting: Internal Medicine

## 2017-10-24 NOTE — Telephone Encounter (Signed)
Last filled 09-05-17 #60 Last OV 10-01-17 acute Next OV 12-20-17

## 2017-10-31 ENCOUNTER — Other Ambulatory Visit: Payer: Self-pay | Admitting: Internal Medicine

## 2017-10-31 DIAGNOSIS — D631 Anemia in chronic kidney disease: Secondary | ICD-10-CM | POA: Diagnosis not present

## 2017-10-31 DIAGNOSIS — N184 Chronic kidney disease, stage 4 (severe): Secondary | ICD-10-CM | POA: Diagnosis not present

## 2017-10-31 DIAGNOSIS — I129 Hypertensive chronic kidney disease with stage 1 through stage 4 chronic kidney disease, or unspecified chronic kidney disease: Secondary | ICD-10-CM | POA: Diagnosis not present

## 2017-10-31 DIAGNOSIS — E1122 Type 2 diabetes mellitus with diabetic chronic kidney disease: Secondary | ICD-10-CM | POA: Diagnosis not present

## 2017-10-31 DIAGNOSIS — R809 Proteinuria, unspecified: Secondary | ICD-10-CM | POA: Diagnosis not present

## 2017-10-31 DIAGNOSIS — N2581 Secondary hyperparathyroidism of renal origin: Secondary | ICD-10-CM | POA: Diagnosis not present

## 2017-10-31 DIAGNOSIS — R601 Generalized edema: Secondary | ICD-10-CM | POA: Diagnosis not present

## 2017-10-31 NOTE — Telephone Encounter (Signed)
Approved: find out if she is taking it regularly still If so, okay #[phone] x 0

## 2017-11-19 DIAGNOSIS — H524 Presbyopia: Secondary | ICD-10-CM | POA: Diagnosis not present

## 2017-11-19 LAB — HM DIABETES EYE EXAM

## 2017-11-20 ENCOUNTER — Encounter: Payer: Self-pay | Admitting: Cardiovascular Disease

## 2017-11-20 ENCOUNTER — Ambulatory Visit: Payer: Medicare HMO | Admitting: Cardiovascular Disease

## 2017-11-20 VITALS — BP 136/75 | HR 90 | Ht 64.0 in | Wt 256.5 lb

## 2017-11-20 DIAGNOSIS — N184 Chronic kidney disease, stage 4 (severe): Secondary | ICD-10-CM

## 2017-11-20 DIAGNOSIS — E1121 Type 2 diabetes mellitus with diabetic nephropathy: Secondary | ICD-10-CM | POA: Diagnosis not present

## 2017-11-20 DIAGNOSIS — I5033 Acute on chronic diastolic (congestive) heart failure: Secondary | ICD-10-CM | POA: Insufficient documentation

## 2017-11-20 DIAGNOSIS — I5032 Chronic diastolic (congestive) heart failure: Secondary | ICD-10-CM | POA: Insufficient documentation

## 2017-11-20 MED ORDER — METOPROLOL SUCCINATE ER 25 MG PO TB24: 25.0000 mg | ORAL_TABLET | Freq: Every day | ORAL | 3 refills | Status: DC

## 2017-11-20 MED ORDER — FUROSEMIDE 40 MG PO TABS: ORAL_TABLET | ORAL | 3 refills | Status: DC

## 2017-11-20 NOTE — Patient Instructions (Addendum)
Medication Instructions:   Please increase the lasix up to 40 mg twice a day alternate with lasix 40 mg once a day  Cut back on water and pepsi  Please start metoprolol succinate 25 mg daily, for tachycardia  Labwork:  BMP in one month  Testing/Procedures:  No further testing at this time   Follow-Up: It was a pleasure seeing you in the office today. Please call us if you have new issues that need to be addressed before your next appt.  [phone]  Your physician wants you to follow-up in:  As needed  If you need a refill on your cardiac medications before your next appointment, please call your pharmacy.  For educational health videos Log in to : [email or web address] Or : [email or web address], password : [email or web address]

## 2017-11-20 NOTE — Progress Notes (Signed)
Cardiology Office Note  Date:  11/20/2017   ID:  [name], DOB 07/09/1950, MRN [id number]  PCP:  Carrie Carbon, MD   Chief Complaint  Patient presents with  . OTHER    Est. Care due to Family Hx and CHF. Meds reviewed verbally with pt.    HPI:  [name] is a 67 year old woman with past medical history of anemia Chronic renal insuff, creatinine 2.3 Morbid obesity Chronic neck pain History of brain cancer per the patient Who presents for worsening shortness of breath  Recently seen by Dr. Silvio [name], Started on Lasix 40 mg daily She reports high fluid intake  especially at bed, frequent trips to the bathroom overnight Water and diet pepsi Sleeps in recliner Does not eat out, tries not to cook with salt  Feels that the Lasix in the morning helps her in telemetry afternoon, Later in the day and when she first wake up in the morning has recurrent shortness of breath  Echocardiogram reviewed with her in detail Echo 10/2017 Normal LV function with grade 1 diastolic dysfunction Borderline mild aortic valve stenosis Unable to estimate right heart pressures  EKG personally reviewed by myself on todays visit Shows normal sinus rhythm rate 90 bpm no significant ST or T-wave changes    PMH:   has a past medical history of Allergy, Arthritis, CKD (chronic kidney disease), stage III ([place]), Depression, History of blood transfusion (2008), History of MRSA infection (2008), Hypertension, Meningioma ([place]), and Type II diabetes mellitus ([place]) (2011).  PSH:    Past Surgical History:  Procedure Laterality Date  . BRAIN MENINGIOMA EXCISION  08/2006   Foramina magnum meningioma  . CERVICAL DISCECTOMY  1996   Dr. Alric [name]  . CESAREAN SECTION  1981  . COLONOSCOPY    . DILATION AND CURETTAGE OF UTERUS    . JOINT REPLACEMENT    . Right wrist x-ray  2007   Deg. at 1st MCP  . TOTAL HIP ARTHROPLASTY Left 03/27/2016   Procedure: LEFT TOTAL HIP ARTHROPLASTY ANTERIOR APPROACH;   Surgeon: Carrie Rossetti, MD;  Location: [facility];  Service: Orthopedics;  Laterality: Left;  . TOTAL KNEE ARTHROPLASTY Right 04/30/2017   Procedure: RIGHT TOTAL KNEE ARTHROPLASTY;  Surgeon: Carrie Rossetti, MD;  Location: [facility];  Service: Orthopedics;  Laterality: Right;  . TUBAL LIGATION  1981    Current Outpatient Medications  Medication Sig Dispense Refill  . amLODipine (NORVASC) 10 MG tablet TAKE 1 TABLET BY MOUTH EVERY DAY 90 tablet 3  . atorvastatin (LIPITOR) 40 MG tablet TAKE ONE TABLET BY MOUTH ONE TIME DAILY (Patient taking differently: Take 40 mg by mouth once daily) 90 tablet 3  . furosemide (LASIX) 40 MG tablet Take 1 tablet (40 mg) by mouth twice every other day alternating with 1 tablet (40 mg) once every other day 45 tablet 3  . levETIRAcetam (KEPPRA) 1000 MG tablet TAKE ONE TABLET BY MOUTH TWICE DAILY 180 tablet 3  . LORazepam (ATIVAN) 0.5 MG tablet TAKE 1/2 TO 1 TABLET BY MOUTH 3 TIMES A DAY AS NEEDED 60 tablet 0  . losartan (COZAAR) 50 MG tablet Take 50 mg by mouth daily.    . NON FORMULARY Doterra 1 tablet daily.    [name] tiZANidine (ZANAFLEX) 2 MG tablet TAKE 1 TABLET (2 MG TOTAL) BY MOUTH 2 (TWO) TIMES DAILY AS NEEDED FOR MUSCLE SPASMS. 180 tablet 0  . metoprolol succinate (TOPROL-XL) 25 MG 24 hr tablet Take 1 tablet (25 mg total) by mouth  daily. 30 tablet 3   No current facility-administered medications for this visit.      Allergies:   Naproxen sodium and Sulfamethoxazole-trimethoprim   Social History:  The patient  reports that she has never smoked. She has never used smokeless tobacco. She reports that she does not drink alcohol or use drugs.   Family History:   family history includes Breast cancer (age of onset: 69) in her mother; Heart disease in her sister; Heart failure in her sister; Hypertension in her other.    Review of Systems: Review of Systems  Constitutional: Negative.   Respiratory: Positive for shortness of breath.   Cardiovascular:  Positive for leg swelling.  Gastrointestinal: Negative.   Musculoskeletal: Negative.   Neurological: Negative.   Psychiatric/Behavioral: Negative.   All other systems reviewed and are negative.    PHYSICAL EXAM: VS:  BP 136/75 (BP Location: Right Arm, Patient Position: Sitting, Cuff Size: Large)   Pulse 90   Ht 5\' 4"  (1.626 m)   Wt 256 lb 8 oz (116.3 kg)   BMI 44.03 kg/m  , BMI Body mass index is 44.03 kg/m. GEN: Well nourished, well developed, in no acute distress , Obese HEENT: normal  Neck: no JVD, carotid bruits, or masses Cardiac: RRR; no murmurs, rubs, or gallops,no edema  Respiratory:  clear to auscultation bilaterally, normal work of breathing GI: soft, nontender, nondistended, + BS MS: no deformity or atrophy  Skin: warm and dry, no rash Neuro:  Strength and sensation are intact Psych: euthymic mood, full affect   Recent Labs: 10/01/2017: BUN 34; Creatinine, Ser 2.92; Hemoglobin 9.6; Platelets 273.0; Potassium 4.7; Sodium 141    Lipid Panel Lab Results  Component Value Date   CHOL 187 11/05/2016   HDL 50.30 11/05/2016   LDLCALC 108 (H) 11/05/2016   TRIG 144.0 11/05/2016      Wt Readings from Last 3 Encounters:  11/20/17 256 lb 8 oz (116.3 kg)  10/01/17 263 lb (119.3 kg)  08/13/17 254 lb (115.2 kg)       ASSESSMENT AND PLAN:  Chronic diastolic CHF (congestive heart failure) (HCC) - Recommended she increase Lasix up to 40 mg twice a day alternating with 40 mg daily Suggested she decrease her fluid and soda intake Will need to proceed cautiously given her renal failure Normal ejection fraction She denies symptoms concerning for sleep apnea recommended she start metoprolol succinate 25 mg daily for tachycardia on exertion  Morbid obesity ([place]) We have encouraged continued exercise, careful diet management in an effort to lose weight. Likely contributing to symptoms above  Chronic kidney disease, stage IV (severe) ([place]) Followed by nephrology, Dr.  Abigail [name] Likely secondary to long-standing diabetes  Controlled type 2 diabetes mellitus with diabetic nephropathy, without long-term current use of insulin (HCC) a1c typically in the 6 range Recommended low carbohydrate diet  Disposition:   F/U  As needed   Total encounter time more than 45 minutes  Greater than 50% was spent in counseling and coordination of care with the patient   Orders Placed This Encounter  Procedures  . Basic metabolic panel  . EKG 12-Lead     Signed, Esmond Plants, M.D., Ph.D. 11/20/2017  [place], [place]

## 2017-11-22 ENCOUNTER — Encounter: Payer: Self-pay | Admitting: Internal Medicine

## 2017-12-02 ENCOUNTER — Other Ambulatory Visit: Payer: Self-pay | Admitting: Internal Medicine

## 2017-12-20 ENCOUNTER — Ambulatory Visit (INDEPENDENT_AMBULATORY_CARE_PROVIDER_SITE_OTHER): Payer: Medicare HMO | Admitting: Internal Medicine

## 2017-12-20 ENCOUNTER — Encounter: Payer: Medicare HMO | Admitting: Internal Medicine

## 2017-12-20 ENCOUNTER — Encounter: Payer: Self-pay | Admitting: Internal Medicine

## 2017-12-20 VITALS — BP 118/84 | HR 73 | Temp 97.6°F | Ht 62.5 in | Wt 254.0 lb

## 2017-12-20 DIAGNOSIS — Z23 Encounter for immunization: Secondary | ICD-10-CM

## 2017-12-20 DIAGNOSIS — F39 Unspecified mood [affective] disorder: Secondary | ICD-10-CM | POA: Diagnosis not present

## 2017-12-20 DIAGNOSIS — Z6841 Body Mass Index (BMI) 40.0 and over, adult: Secondary | ICD-10-CM

## 2017-12-20 DIAGNOSIS — E1121 Type 2 diabetes mellitus with diabetic nephropathy: Secondary | ICD-10-CM

## 2017-12-20 DIAGNOSIS — Z1211 Encounter for screening for malignant neoplasm of colon: Secondary | ICD-10-CM

## 2017-12-20 DIAGNOSIS — D329 Benign neoplasm of meninges, unspecified: Secondary | ICD-10-CM

## 2017-12-20 DIAGNOSIS — Z0000 Encounter for general adult medical examination without abnormal findings: Secondary | ICD-10-CM | POA: Diagnosis not present

## 2017-12-20 DIAGNOSIS — I5032 Chronic diastolic (congestive) heart failure: Secondary | ICD-10-CM | POA: Diagnosis not present

## 2017-12-20 DIAGNOSIS — N184 Chronic kidney disease, stage 4 (severe): Secondary | ICD-10-CM

## 2017-12-20 DIAGNOSIS — Z7189 Other specified counseling: Secondary | ICD-10-CM | POA: Diagnosis not present

## 2017-12-20 DIAGNOSIS — E1122 Type 2 diabetes mellitus with diabetic chronic kidney disease: Secondary | ICD-10-CM | POA: Diagnosis not present

## 2017-12-20 LAB — BRAIN NATRIURETIC PEPTIDE: Pro B Natriuretic peptide (BNP): 161 pg/mL — ABNORMAL HIGH (ref 0.0–100.0)

## 2017-12-20 LAB — HEPATIC FUNCTION PANEL
ALK PHOS: 137 U/L — AB (ref 39–117)
ALT: 10 U/L (ref 0–35)
AST: 13 U/L (ref 0–37)
Albumin: 4.1 g/dL (ref 3.5–5.2)
BILIRUBIN TOTAL: 0.3 mg/dL (ref 0.2–1.2)
Bilirubin, Direct: 0.1 mg/dL (ref 0.0–0.3)
Total Protein: 8.5 g/dL — ABNORMAL HIGH (ref 6.0–8.3)

## 2017-12-20 LAB — CBC
HEMATOCRIT: 31.6 % — AB (ref 36.0–46.0)
Hemoglobin: 10.2 g/dL — ABNORMAL LOW (ref 12.0–15.0)
MCHC: 32.3 g/dL (ref 30.0–36.0)
MCV: 80.9 fl (ref 78.0–100.0)
Platelets: 366 10*3/uL (ref 150.0–400.0)
RBC: 3.9 Mil/uL (ref 3.87–5.11)
RDW: 18 % — AB (ref 11.5–15.5)
WBC: 6.6 10*3/uL (ref 4.0–10.5)

## 2017-12-20 LAB — LIPID PANEL
Cholesterol: 178 mg/dL (ref 0–200)
HDL: 48.7 mg/dL (ref >39.00)
LDL Cholesterol: 113 mg/dL — ABNORMAL HIGH (ref 0–99)
NonHDL: 129.62
Total CHOL/HDL Ratio: 4
Triglycerides: 85 mg/dL (ref 0.0–149.0)
VLDL: 17 mg/dL (ref 0.0–40.0)

## 2017-12-20 LAB — HEMOGLOBIN A1C: HEMOGLOBIN A1C: 6.7 % — AB (ref 4.6–6.5)

## 2017-12-20 LAB — RENAL FUNCTION PANEL
Albumin: 4.1 g/dL (ref 3.5–5.2)
BUN: 51 mg/dL — ABNORMAL HIGH (ref 6–23)
CO2: 26 mEq/L (ref 19–32)
Calcium: 9.2 mg/dL (ref 8.4–10.5)
Chloride: 106 mEq/L (ref 96–112)
Creatinine, Ser: 3.62 mg/dL — ABNORMAL HIGH (ref 0.40–1.20)
GFR: 16.03 mL/min — AB (ref >60.00)
GLUCOSE: 118 mg/dL — AB (ref 70–99)
POTASSIUM: 4 meq/L (ref 3.5–5.1)
Phosphorus: 4.8 mg/dL — ABNORMAL HIGH (ref 2.3–4.6)
SODIUM: 141 meq/L (ref 135–145)

## 2017-12-20 LAB — HM DIABETES FOOT EXAM

## 2017-12-20 NOTE — Assessment & Plan Note (Signed)
I have personally reviewed the Medicare Annual Wellness questionnaire and have noted 1. The patient's medical and social history 2. Their use of alcohol, tobacco or illicit drugs 3. Their current medications and supplements 4. The patient's functional ability including ADL's, fall risks, home safety risks and hearing or visual             impairment. 5. Diet and physical activities 6. Evidence for depression or mood disorders  The patients weight, height, BMI and visual acuity have been recorded in the chart I have made referrals, counseling and provided education to the patient based review of the above and I have provided the pt with a written personalized care plan for preventive services.  I have provided you with a copy of your personalized plan for preventive services. Please take the time to review along with your updated medication list.  Update pneumovax today Yearly flu vaccine Consider shingrix Will do FIT UTD on mammogram---due in ~2 years

## 2017-12-20 NOTE — Assessment & Plan Note (Signed)
No evidence of recurrence Continues on the keppra (I believe life long) due to the location in brain stem and risk of seizures

## 2017-12-20 NOTE — Assessment & Plan Note (Signed)
See social history 

## 2017-12-20 NOTE — Addendum Note (Signed)
Addended by: Pilar Grammes on: 12/20/2017 11:38 AM   Modules accepted: Orders

## 2017-12-20 NOTE — Assessment & Plan Note (Signed)
Trying to watch diet Goal is modest weight loss

## 2017-12-20 NOTE — Progress Notes (Signed)
Subjective:    Patient ID: Carrie [name], female    DOB: Oct 21, 1950, 67 y.o.   MRN: [id number]  HPI Here with sister for Medicare wellness visit and follow up of chronic health conditions Reviewed form and advanced directives Reviewed other doctors No alcohol or tobacco Not really able to exercise No falls No sig depression---rare grieving. Not anhedonic. No regular anxiety Independent with instrumental ADLs Vision and hearing are okay No sig memory issues  Still has some pain in knees Right is better since the surgery Left is bad but not ready for TKR on that yet Uses tylenol--- and occ the tizanidine at night  Not checking sugars Still not on meds No foot numbness or sores Off metformin due to renal disease  Weight up and down Checks it regularly Tries to eat healthy--but can't exercise  No chest pain Some DOE--but stable Has the furosemide for bid as needed--but mostly once a day No dizziness or syncope  Current Outpatient Medications on File Prior to Visit  Medication Sig Dispense Refill  . amLODipine (NORVASC) 10 MG tablet TAKE 1 TABLET BY MOUTH EVERY DAY 90 tablet 3  . atorvastatin (LIPITOR) 40 MG tablet Take 40 mg by mouth once daily 90 tablet 0  . furosemide (LASIX) 40 MG tablet Take 1 tablet (40 mg) by mouth twice every other day alternating with 1 tablet (40 mg) once every other day 45 tablet 3  . levETIRAcetam (KEPPRA) 1000 MG tablet TAKE ONE TABLET BY MOUTH TWICE DAILY 180 tablet 3  . LORazepam (ATIVAN) 0.5 MG tablet TAKE 1/2 TO 1 TABLET BY MOUTH 3 TIMES A DAY AS NEEDED 60 tablet 0  . losartan (COZAAR) 50 MG tablet Take 50 mg by mouth daily.    . metoprolol succinate (TOPROL-XL) 25 MG 24 hr tablet Take 1 tablet (25 mg total) by mouth daily. 30 tablet 3  . NON FORMULARY Doterra 1 tablet daily.    [name] tiZANidine (ZANAFLEX) 2 MG tablet TAKE 1 TABLET (2 MG TOTAL) BY MOUTH 2 (TWO) TIMES DAILY AS NEEDED FOR MUSCLE SPASMS. 180 tablet 0   No current  facility-administered medications on file prior to visit.     Allergies  Allergen Reactions  . Naproxen Sodium Anaphylaxis and Swelling  . Sulfamethoxazole-Trimethoprim Anaphylaxis and Swelling    Past Medical History:  Diagnosis Date  . Allergy   . Arthritis    "right knee" (10/26'/2017)  . CKD (chronic kidney disease), stage III ([place])    12/2015 (Dr. Lavonia Dana)  . Depression   . History of blood transfusion 2008   "when I had brain tumor surgery"  . History of MRSA infection 2008  . Hypertension   . Meningioma ([place])    Foramen magnum  . Type II diabetes mellitus ([place]) 2011   on metformin until yr ago- no med now - off due to kidneys    Past Surgical History:  Procedure Laterality Date  . BRAIN MENINGIOMA EXCISION  08/2006   Foramina magnum meningioma  . CERVICAL DISCECTOMY  1996   Dr. Alric Seton  . CESAREAN SECTION  1981  . COLONOSCOPY    . DILATION AND CURETTAGE OF UTERUS    . JOINT REPLACEMENT    . Right wrist x-ray  2007   Deg. at 1st MCP  . TOTAL HIP ARTHROPLASTY Left 03/27/2016   Procedure: LEFT TOTAL HIP ARTHROPLASTY ANTERIOR APPROACH;  Surgeon: Mcarthur Rossetti, MD;  Location: [facility];  Service: Orthopedics;  Laterality: Left;  . TOTAL KNEE ARTHROPLASTY  Right 04/30/2017   Procedure: RIGHT TOTAL KNEE ARTHROPLASTY;  Surgeon: Mcarthur Rossetti, MD;  Location: [facility];  Service: Orthopedics;  Laterality: Right;  . TUBAL LIGATION  1981    Family History  Problem Relation Age of Onset  . Hypertension Other        Strong family history   . Breast cancer Mother 36  . Heart failure Sister   . Heart disease Sister     Social History   Socioeconomic History  . Marital status: Widowed    Spouse name: Not on file  . Number of children: 1  . Years of education: Not on file  . Highest education level: Not on file  Occupational History  . 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 Occupation: Disabled since brain surgery  Social Needs  .  Financial resource strain: Not on file  . Food insecurity:    Worry: Not on file    Inability: Not on file  . Transportation needs:    Medical: Not on file    Non-medical: Not on file  Tobacco Use  . Smoking status: Never Smoker  . Smokeless tobacco: Never Used  Substance and Sexual Activity  . Alcohol use: No    Alcohol/week: 0.0 oz  . Drug use: No  . Sexual activity: Never  Lifestyle  . Physical activity:    Days per week: Not on file    Minutes per session: Not on file  . Stress: Not on file  Relationships  . Social connections:    Talks on phone: Not on file    Gets together: Not on file    Attends religious service: Not on file    Active member of club or organization: Not on file    Attends meetings of clubs or organizations: Not on file    Relationship status: Not on file  . Intimate partner violence:    Fear of current or ex partner: Not on file    Emotionally abused: Not on file    Physically abused: Not on file    Forced sexual activity: Not on file  Other Topics Concern  . Not on file  Social History Narrative   Lives in family home with extended family.      No living will   Requests niece, [name], as health care POA   Would accept resuscitation attempts   No tube feeds if cognitively aware   Review of Systems On the keppra since meningioma removal. No seizures. No focal weakness Appetite is good Sleep is spotty still--- no sig daytime somnolence Hasn't kept up with the dentist lately No skin rash or suspicious lesions No heartburn or dysphagia No joint pain other than her knees     Objective:   Physical Exam  Constitutional: She is oriented to person, place, and time. She appears well-developed. No distress.  HENT:  Mouth/Throat: Oropharynx is clear and moist. No oropharyngeal exudate.  Neck: No thyromegaly present.  Cardiovascular: Normal rate, regular rhythm, normal heart sounds and intact distal pulses. Exam reveals no gallop.  No  murmur heard. Respiratory: Effort normal and breath sounds normal. No respiratory distress. She has no wheezes. She has no rales.  GI: Soft. There is no tenderness.  Musculoskeletal: She exhibits no tenderness.  Thick calves without pitting  Lymphadenopathy:    She has no cervical adenopathy.  Neurological: She is alert and oriented to person, place, and time.  President--- "Daisy Floro, Obama, Bush" 100-  "I need pencil and  paper" D-[id number] Recall 3/3  Normal sensation in feet  Skin: No rash noted. No erythema.  No foot lesions  Psychiatric: She has a normal mood and affect. Her behavior is normal.           Assessment & Plan:

## 2017-12-20 NOTE — Assessment & Plan Note (Signed)
Seems compensated Has cardiology visit next week

## 2017-12-20 NOTE — Assessment & Plan Note (Signed)
Occasional grieving but nothing significant

## 2017-12-20 NOTE — Assessment & Plan Note (Signed)
On ARB Keeps up with nephrology

## 2017-12-20 NOTE — Assessment & Plan Note (Signed)
Off meds  if over 7.5%, would start low dose glimepiride

## 2017-12-20 NOTE — Progress Notes (Signed)
Hearing Screening   Method: Audiometry   125Hz  250Hz  500Hz  1000Hz  2000Hz  3000Hz  4000Hz  6000Hz  8000Hz   Right ear:   40 25 25  40    Left ear:   40 40 25  40    Vision Screening Comments: June 2019

## 2017-12-23 ENCOUNTER — Telehealth: Payer: Self-pay | Admitting: Cardiovascular Disease

## 2017-12-23 ENCOUNTER — Other Ambulatory Visit: Payer: Medicare HMO

## 2017-12-23 MED ORDER — FUROSEMIDE 40 MG PO TABS: ORAL_TABLET | ORAL | Status: DC

## 2017-12-23 NOTE — Telephone Encounter (Signed)
Pt calling stating she did labs at PCP (in epic) would like to know if she still needs to come today to draw labs  Please advise

## 2017-12-23 NOTE — Telephone Encounter (Signed)
Dr. Rockey Situ saw the patient on 11/20/17 and had instructed her to increase lasix to 40 mg twice every other day alternating with 40 mg once every other day. She was due to come back in today for a repeat BMP. She saw Dr. Silvio Pate on 12/20/17 and labs were drawn at that visit.  Reviewed the patient's most recent lab work with Dr. Rockey Situ dated 12/20/17.  12/20/17- BUN/ creatinine- 51/ 3.62 10/01/17- BUN/ creatinine- 34/ 2.92  Per Dr. Rockey Situ- have the patient decrease lasix to 40 mg once daily- if already taking lasix 40 mg once daily, may decrease to 40 mg once every other day. She is slightly anemic and SOB could be coming from this.  Can also offer stress testing to evaluate for ischemia as the source of her SOB.  She will need to follow up with Dr. Abigail Butts (nephrololgy).  I called the patient and advised her she does not need to come to our office today for a repeat BMP. She confirms she is currently taking lasix 40 mg once daily. She is peeing on this dose. She states her breathing is not good right at this time as she is with her sister at a doctor's appt and just had to walk up hill. She states she does typically get SOB walking up hill or with exertion. She is aware that she is anemic and that her anemia may cause her SOB.  I have advised her: 1) due to her renal function, to decrease lasix to 40 mg once every day and see how her breathing tolerates this. 2) she has follow up with Dr. Abigail Butts in September, but I have asked her to call his office and request a sooner appt due to her current renal function 3) she is aware that she may call our office back if her breathing issues do not resolve with improvement of her renal function and anemia and we can order a lexiscan myoview on her ( she is unable to walk on a treadmill due to previous joint surgeries and possibly needing another knee replacement).   She is agreeable with the above plan.

## 2018-01-20 ENCOUNTER — Other Ambulatory Visit: Payer: Self-pay | Admitting: Internal Medicine

## 2018-01-20 NOTE — Telephone Encounter (Signed)
Last filled 10-24-17 #60 Last OV 12-20-17 Next OV 06-23-18

## 2018-01-28 ENCOUNTER — Other Ambulatory Visit: Payer: Self-pay | Admitting: Internal Medicine

## 2018-01-28 NOTE — Telephone Encounter (Signed)
Approved:  #[id number] x [phone] 

## 2018-02-11 ENCOUNTER — Ambulatory Visit (INDEPENDENT_AMBULATORY_CARE_PROVIDER_SITE_OTHER): Payer: Medicare HMO | Admitting: Orthopaedic Surgery

## 2018-02-11 ENCOUNTER — Encounter (INDEPENDENT_AMBULATORY_CARE_PROVIDER_SITE_OTHER): Payer: Self-pay | Admitting: Orthopaedic Surgery

## 2018-02-11 ENCOUNTER — Ambulatory Visit (INDEPENDENT_AMBULATORY_CARE_PROVIDER_SITE_OTHER): Payer: Medicare HMO

## 2018-02-11 DIAGNOSIS — Z96651 Presence of right artificial knee joint: Secondary | ICD-10-CM | POA: Diagnosis not present

## 2018-02-11 NOTE — Progress Notes (Signed)
The patient is well-known to me.  She is a 67 year old female who is 9 months status post a right total knee arthroplasty.  She is someone with a BMI of almost 46.  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  She is done very well since then.  She has known significant arthritis in her left knee which is bothering her but not enough to pursue any surgery or even an injection from her standpoint.  Examination of her right operative knee shows a well-healed surgical incision.  Her swelling is minimal.  Her extension is full and her flexion is to almost 120 degrees which I am impressed with given her obesity.  The knee feels like mostly stable overall.  There is no significant warmth to it.  An AP and lateral of the right knee shows a well-seated total knee arthroplasty with good alignment and no complicating features.  At this point she will follow-up as needed.  She understands that if the right knee starts bothering her in any way we will see her back for that.  She also understands that the next step for her left knee at some point would be considering a total knee arthroplasty on that knee to treat her severe osteoarthritis.  All question concerns were answered and addressed.

## 2018-02-13 DIAGNOSIS — N184 Chronic kidney disease, stage 4 (severe): Secondary | ICD-10-CM | POA: Diagnosis not present

## 2018-02-13 DIAGNOSIS — E1122 Type 2 diabetes mellitus with diabetic chronic kidney disease: Secondary | ICD-10-CM | POA: Diagnosis not present

## 2018-02-13 DIAGNOSIS — I129 Hypertensive chronic kidney disease with stage 1 through stage 4 chronic kidney disease, or unspecified chronic kidney disease: Secondary | ICD-10-CM | POA: Diagnosis not present

## 2018-02-13 DIAGNOSIS — N2581 Secondary hyperparathyroidism of renal origin: Secondary | ICD-10-CM | POA: Diagnosis not present

## 2018-02-13 DIAGNOSIS — N179 Acute kidney failure, unspecified: Secondary | ICD-10-CM | POA: Diagnosis not present

## 2018-02-13 DIAGNOSIS — D631 Anemia in chronic kidney disease: Secondary | ICD-10-CM | POA: Diagnosis not present

## 2018-02-13 DIAGNOSIS — R809 Proteinuria, unspecified: Secondary | ICD-10-CM | POA: Diagnosis not present

## 2018-02-19 ENCOUNTER — Other Ambulatory Visit: Payer: Self-pay | Admitting: Nephrology

## 2018-02-19 DIAGNOSIS — N179 Acute kidney failure, unspecified: Secondary | ICD-10-CM

## 2018-02-19 DIAGNOSIS — N184 Chronic kidney disease, stage 4 (severe): Secondary | ICD-10-CM

## 2018-02-26 ENCOUNTER — Ambulatory Visit
Admission: RE | Admit: 2018-02-26 | Discharge: 2018-02-26 | Disposition: A | Discharge status: Home / Self Care | Payer: Medicare HMO | Source: Ambulatory Visit | Attending: Nephrology | Admitting: Nephrology

## 2018-02-26 DIAGNOSIS — N179 Acute kidney failure, unspecified: Secondary | ICD-10-CM | POA: Insufficient documentation

## 2018-02-26 DIAGNOSIS — N183 Chronic kidney disease, stage 3 (moderate): Secondary | ICD-10-CM | POA: Diagnosis not present

## 2018-02-26 DIAGNOSIS — N184 Chronic kidney disease, stage 4 (severe): Secondary | ICD-10-CM | POA: Diagnosis not present

## 2018-03-09 ENCOUNTER — Other Ambulatory Visit: Payer: Self-pay | Admitting: Internal Medicine

## 2018-03-11 NOTE — Telephone Encounter (Signed)
Approved: nephrologist asked her to continue furosemide Check her dosing--I think she takes it every other day

## 2018-03-11 NOTE — Telephone Encounter (Signed)
Her BUN/Creatinine were elevated in July. Do I need to refill that furosemide.

## 2018-03-12 ENCOUNTER — Ambulatory Visit (INDEPENDENT_AMBULATORY_CARE_PROVIDER_SITE_OTHER): Payer: Medicare HMO | Admitting: Orthopaedic Surgery

## 2018-03-12 ENCOUNTER — Other Ambulatory Visit: Payer: Self-pay | Admitting: Cardiovascular Disease

## 2018-04-02 DIAGNOSIS — I120 Hypertensive chronic kidney disease with stage 5 chronic kidney disease or end stage renal disease: Secondary | ICD-10-CM | POA: Diagnosis not present

## 2018-04-02 DIAGNOSIS — N185 Chronic kidney disease, stage 5: Secondary | ICD-10-CM | POA: Diagnosis not present

## 2018-04-02 DIAGNOSIS — D631 Anemia in chronic kidney disease: Secondary | ICD-10-CM | POA: Diagnosis not present

## 2018-04-02 DIAGNOSIS — N2581 Secondary hyperparathyroidism of renal origin: Secondary | ICD-10-CM | POA: Diagnosis not present

## 2018-04-21 ENCOUNTER — Other Ambulatory Visit: Payer: Self-pay | Admitting: Internal Medicine

## 2018-04-21 NOTE — Telephone Encounter (Signed)
Last filled 01-20-18 #60 Last OV 12-20-17 Next OV 06-23-18

## 2018-05-07 DIAGNOSIS — I120 Hypertensive chronic kidney disease with stage 5 chronic kidney disease or end stage renal disease: Secondary | ICD-10-CM | POA: Diagnosis not present

## 2018-05-07 DIAGNOSIS — N2581 Secondary hyperparathyroidism of renal origin: Secondary | ICD-10-CM | POA: Diagnosis not present

## 2018-05-07 DIAGNOSIS — N185 Chronic kidney disease, stage 5: Secondary | ICD-10-CM | POA: Diagnosis not present

## 2018-05-07 DIAGNOSIS — D631 Anemia in chronic kidney disease: Secondary | ICD-10-CM | POA: Diagnosis not present

## 2018-05-07 DIAGNOSIS — R809 Proteinuria, unspecified: Secondary | ICD-10-CM | POA: Diagnosis not present

## 2018-05-09 ENCOUNTER — Other Ambulatory Visit: Payer: Self-pay | Admitting: Internal Medicine

## 2018-06-03 ENCOUNTER — Encounter: Payer: Self-pay | Admitting: Family Medicine

## 2018-06-03 ENCOUNTER — Ambulatory Visit (INDEPENDENT_AMBULATORY_CARE_PROVIDER_SITE_OTHER): Payer: Medicare HMO | Admitting: Family Medicine

## 2018-06-03 VITALS — BP 144/86 | HR 75 | Temp 98.4°F | Resp 14 | Ht 62.5 in | Wt 256.5 lb

## 2018-06-03 DIAGNOSIS — I110 Hypertensive heart disease with heart failure: Secondary | ICD-10-CM

## 2018-06-03 DIAGNOSIS — I10 Essential (primary) hypertension: Secondary | ICD-10-CM

## 2018-06-03 DIAGNOSIS — J069 Acute upper respiratory infection, unspecified: Secondary | ICD-10-CM

## 2018-06-03 DIAGNOSIS — B9789 Other viral agents as the cause of diseases classified elsewhere: Secondary | ICD-10-CM

## 2018-06-03 DIAGNOSIS — I5032 Chronic diastolic (congestive) heart failure: Secondary | ICD-10-CM | POA: Diagnosis not present

## 2018-06-03 MED ORDER — HYDROCODONE-HOMATROPINE 5-1.5 MG/5ML PO SYRP: 5.0000 mL | ORAL_SOLUTION | Freq: Three times a day (TID) | ORAL | 0 refills | Status: DC | PRN

## 2018-06-03 NOTE — Assessment & Plan Note (Signed)
BP a little high today but suspect this is in setting of illness. Sees PCP 06/23/2018. Will plan for follow-up then.

## 2018-06-03 NOTE — Patient Instructions (Signed)
Based on your symptoms, it looks like you have a virus.   Antibiotics are not need for a viral infection but the following will help:   1. Drink plenty of fluids 2. Get lots of rest  Sinus Congestion 1) Neti Pot (Saline rinse) -- 2 times day -- if tolerated 2) Flonase (Store Brand ok) - once daily 3) Over the counter congestion medications  Cough 1) Cough drops can be helpful 2) Honey is proven to be one of the best cough medications  3) Prescribed cough syrup to help at night  Sore Throat 1) Honey as above, cough drops 2) Ibuprofen or Aleve can be helpful 3) Salt water Gargles  If you develop fevers (Temperature >100.4), chills, worsening symptoms or symptoms lasting longer than 10 days return to clinic.

## 2018-06-03 NOTE — Assessment & Plan Note (Signed)
Wt stable. No crackles on lung exam. Do not suspect CHF exacerbation as the cause of cough.

## 2018-06-03 NOTE — Progress Notes (Signed)
Subjective:     Carrie [name] is a 67 y.o. female presenting for Cough (Started on 06/01/18. Cough is dry. Cough is so bad that it makes her short of breath. Irritated throat, runny nose-clear mucus. No fever. No body aches. She has taking OTC anti cough liquid-not sure of the name. )     Cough  This is a new problem. The current episode started in the past 7 days. The problem has been gradually worsening. The cough is non-productive. Associated symptoms include ear pain (left), nasal congestion, rhinorrhea, a sore throat, shortness of breath and wheezing. Pertinent negatives include no chills, fever, myalgias or postnasal drip. Exacerbated by: trying to go to sleep. She has tried OTC cough suppressant for the symptoms. The treatment provided no relief. There is no history of asthma, COPD or environmental allergies.   The cough syrup with codeine did help  CHF - on torsemide - has not gained weight - more short of breath with walking  Review of Systems  Constitutional: Negative for chills and fever.  HENT: Positive for ear pain (left), rhinorrhea and sore throat. Negative for postnasal drip.   Respiratory: Positive for cough, shortness of breath and wheezing.   Musculoskeletal: Negative for myalgias.  Allergic/Immunologic: Negative for environmental allergies.     Social History   Tobacco Use  Smoking Status Never Smoker  Smokeless Tobacco Never Used        Objective:    BP Readings from Last 3 Encounters:  06/03/18 (!) 144/86  12/20/17 118/84  11/20/17 136/75   Wt Readings from Last 3 Encounters:  06/03/18 256 lb 8 oz (116.3 kg)  12/20/17 254 lb (115.2 kg)  11/20/17 256 lb 8 oz (116.3 kg)    BP (!) 144/86   Pulse 75   Temp 98.4 F (36.9 C)   Resp 14   Ht 5' 2.5" (1.588 m)   Wt 256 lb 8 oz (116.3 kg)   SpO2 98%   BMI 46.17 kg/m    Physical Exam Constitutional:      General: She is not in acute distress.    Appearance: She is well-developed. She is  not diaphoretic.  HENT:     Head: Normocephalic and atraumatic.     Right Ear: Tympanic membrane and ear canal normal.     Left Ear: Ear canal normal. There is impacted cerumen.     Ears:     Comments: Attempted ear lavage x 2 without resolution of all the cerumen.     Nose: Mucosal edema and rhinorrhea present.     Right Sinus: No maxillary sinus tenderness or frontal sinus tenderness.     Left Sinus: No maxillary sinus tenderness or frontal sinus tenderness.     Mouth/Throat:     Pharynx: Uvula midline. Posterior oropharyngeal erythema present. No oropharyngeal exudate.     Tonsils: Swelling: 0 on the right. 0 on the left.  Eyes:     General: No scleral icterus.    Conjunctiva/sclera: Conjunctivae normal.  Neck:     Musculoskeletal: Neck supple.  Cardiovascular:     Rate and Rhythm: Normal rate and regular rhythm.     Heart sounds: Murmur present.  Pulmonary:     Effort: Pulmonary effort is normal. No respiratory distress.     Breath sounds: Normal breath sounds. No wheezing or rales.  Lymphadenopathy:     Cervical: Cervical adenopathy present.  Skin:    General: Skin is warm and dry.     Capillary Refill:  Capillary refill takes less than 2 seconds.  Neurological:     Mental Status: She is alert.           Assessment & Plan:   Problem List Items Addressed This Visit      Cardiovascular and Mediastinum   Essential hypertension, benign    BP a little high today but suspect this is in setting of illness. Sees PCP 06/23/2018. Will plan for follow-up then.       Relevant Medications   torsemide (DEMADEX) 10 MG tablet   Chronic diastolic CHF (congestive heart failure) (HCC)    Wt stable. No crackles on lung exam. Do not suspect CHF exacerbation as the cause of cough.       Relevant Medications   torsemide (DEMADEX) 10 MG tablet    Other Visit Diagnoses    Viral URI with cough    -  Primary   Relevant Medications   HYDROcodone-homatropine (HYCODAN) 5-1.5 MG/5ML  syrup     Advised returning if left ear pain persists as unable to see the TM today due to narrow ear canal and cerumen  Return if symptoms worsen or fail to improve.  Lesleigh Noe, MD

## 2018-06-18 DIAGNOSIS — N185 Chronic kidney disease, stage 5: Secondary | ICD-10-CM | POA: Diagnosis not present

## 2018-06-18 DIAGNOSIS — I120 Hypertensive chronic kidney disease with stage 5 chronic kidney disease or end stage renal disease: Secondary | ICD-10-CM | POA: Diagnosis not present

## 2018-06-18 DIAGNOSIS — D631 Anemia in chronic kidney disease: Secondary | ICD-10-CM | POA: Diagnosis not present

## 2018-06-18 DIAGNOSIS — R809 Proteinuria, unspecified: Secondary | ICD-10-CM | POA: Diagnosis not present

## 2018-06-18 DIAGNOSIS — I129 Hypertensive chronic kidney disease with stage 1 through stage 4 chronic kidney disease, or unspecified chronic kidney disease: Secondary | ICD-10-CM | POA: Diagnosis not present

## 2018-06-18 DIAGNOSIS — N2581 Secondary hyperparathyroidism of renal origin: Secondary | ICD-10-CM | POA: Diagnosis not present

## 2018-06-23 ENCOUNTER — Encounter: Payer: Self-pay | Admitting: Internal Medicine

## 2018-06-23 ENCOUNTER — Ambulatory Visit (INDEPENDENT_AMBULATORY_CARE_PROVIDER_SITE_OTHER): Payer: Medicare HMO | Admitting: Internal Medicine

## 2018-06-23 VITALS — BP 126/84 | HR 72 | Temp 97.8°F | Ht 63.0 in | Wt 252.0 lb

## 2018-06-23 DIAGNOSIS — Z6841 Body Mass Index (BMI) 40.0 and over, adult: Secondary | ICD-10-CM

## 2018-06-23 DIAGNOSIS — Z86018 Personal history of other benign neoplasm: Secondary | ICD-10-CM

## 2018-06-23 DIAGNOSIS — N184 Chronic kidney disease, stage 4 (severe): Secondary | ICD-10-CM

## 2018-06-23 DIAGNOSIS — F39 Unspecified mood [affective] disorder: Secondary | ICD-10-CM | POA: Diagnosis not present

## 2018-06-23 DIAGNOSIS — E1122 Type 2 diabetes mellitus with diabetic chronic kidney disease: Secondary | ICD-10-CM | POA: Diagnosis not present

## 2018-06-23 DIAGNOSIS — E1121 Type 2 diabetes mellitus with diabetic nephropathy: Secondary | ICD-10-CM | POA: Diagnosis not present

## 2018-06-23 DIAGNOSIS — I5032 Chronic diastolic (congestive) heart failure: Secondary | ICD-10-CM

## 2018-06-23 LAB — POCT GLYCOSYLATED HEMOGLOBIN (HGB A1C): Hemoglobin A1C: 5.7 % — AB (ref 4.0–5.6)

## 2018-06-23 MED ORDER — LORAZEPAM 0.5 MG PO TABS: ORAL_TABLET | ORAL | 0 refills | Status: DC

## 2018-06-23 NOTE — Assessment & Plan Note (Signed)
Some depression lately--but not MDD Ongoing situational anxiety Uses the lorazepam

## 2018-06-23 NOTE — Assessment & Plan Note (Signed)
No exacerbation Neutral fluid status

## 2018-06-23 NOTE — Assessment & Plan Note (Signed)
Lab Results  Component Value Date   HGBA1C 5.7 (A) 06/23/2018   Still good control without meds Ongoing renal issues

## 2018-06-23 NOTE — Progress Notes (Signed)
Subjective:    Patient ID: Carrie [name], female    DOB: 1951-01-15, 68 y.o.   MRN: [id number]  HPI Here for follow up of diabetes and other chronic health conditions With sister  Mostly over respiratory illness Some rhinorrhea and slight cough  Has been told she will need to start dialysis Has gone to the kidney class---will be going to see surgeon for shunt  Not checking sugars No clear problems with this Still not on meds  No follow up for meningioma Rare headaches Continues on the keppra --told to stay on this forever due to high seizure risk  Breathing is "off"----easier DOE May be from the illness No edema No chest pain No palpitations No dizziness  Tries to watch eating Has been doing "fast" from church---no fried food, sweets, pork, etc Weight is down slightly  Very upset about the dialysis prospect Some depressed mood Near 6 year anniversary of daughter's suicide, etc  Current Outpatient Medications on File Prior to Visit  Medication Sig Dispense Refill  . amLODipine (NORVASC) 10 MG tablet TAKE 1 TABLET BY MOUTH EVERY DAY 90 tablet 3  . atorvastatin (LIPITOR) 40 MG tablet TAKE 40 MG BY MOUTH ONCE DAILY 90 tablet 3  . levETIRAcetam (KEPPRA) 1000 MG tablet TAKE ONE TABLET BY MOUTH TWICE DAILY 180 tablet 3  . LORazepam (ATIVAN) 0.5 MG tablet TAKE 1/2 TO 1 TABLET BY MOUTH 3 TIMES A DAY AS NEEDED 60 tablet 0  . losartan (COZAAR) 50 MG tablet Take 50 mg by mouth daily.    . metoprolol succinate (TOPROL-XL) 25 MG 24 hr tablet TAKE 1 TABLET BY MOUTH EVERY DAY 30 tablet 8  . NON FORMULARY Doterra 1 tablet daily.    [name] torsemide (DEMADEX) 10 MG tablet Take 10 mg by mouth daily.     No current facility-administered medications on file prior to visit.     Allergies  Allergen Reactions  . Naproxen Sodium Anaphylaxis and Swelling  . Sulfamethoxazole-Trimethoprim Anaphylaxis and Swelling    Past Medical History:  Diagnosis Date  . Allergy   . Arthritis    "right knee" (10/26'/2017)  . CKD (chronic kidney disease), stage III ([place])    12/2015 (Dr. Lavonia Dana)  . Depression   . History of blood transfusion 2008   "when I had brain tumor surgery"  . History of MRSA infection 2008  . Hypertension   . 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 Type II diabetes mellitus ([place]) 2011   on metformin until yr ago- no med now - off due to kidneys    Past Surgical History:  Procedure Laterality Date  . BRAIN MENINGIOMA EXCISION  08/2006   Foramina magnum meningioma  . CERVICAL DISCECTOMY  1996   Dr. Alric Seton  . CESAREAN SECTION  1981  . COLONOSCOPY    . DILATION AND CURETTAGE OF UTERUS    . JOINT REPLACEMENT    . Right wrist x-ray  2007   Deg. at 1st MCP  . TOTAL HIP ARTHROPLASTY Left 03/27/2016   Procedure: LEFT TOTAL HIP ARTHROPLASTY ANTERIOR APPROACH;  Surgeon: Mcarthur Rossetti, MD;  Location: [facility];  Service: Orthopedics;  Laterality: Left;  . TOTAL KNEE ARTHROPLASTY Right 04/30/2017   Procedure: RIGHT TOTAL KNEE ARTHROPLASTY;  Surgeon: Mcarthur Rossetti, MD;  Location: [facility];  Service: Orthopedics;  Laterality: Right;  . TUBAL LIGATION  1981    Family History  Problem Relation Age of Onset  . Hypertension Other  Strong family history   . Breast cancer Mother 75  . Heart failure Sister   . Heart disease Sister     Social History   Socioeconomic History  . Marital status: Widowed    Spouse name: Not on file  . Number of children: 1  . Years of education: Not on file  . Highest education level: Not on file  Occupational History  . 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 Occupation: Disabled since brain surgery  Social Needs  . Financial resource strain: Not on file  . Food insecurity:    Worry: Not on file    Inability: Not on file  . Transportation needs:    Medical: Not on file    Non-medical: Not on file  Tobacco Use  . Smoking status: Never Smoker  . Smokeless tobacco: Never Used    Substance and Sexual Activity  . Alcohol use: No    Alcohol/week: 0.0 standard drinks  . Drug use: No  . Sexual activity: Never  Lifestyle  . Physical activity:    Days per week: Not on file    Minutes per session: Not on file  . Stress: Not on file  Relationships  . Social connections:    Talks on phone: Not on file    Gets together: Not on file    Attends religious service: Not on file    Active member of club or organization: Not on file    Attends meetings of clubs or organizations: Not on file    Relationship status: Not on file  . Intimate partner violence:    Fear of current or ex partner: Not on file    Emotionally abused: Not on file    Physically abused: Not on file    Forced sexual activity: Not on file  Other Topics Concern  . Not on file  Social History Narrative   Lives in family home with extended family.      No living will   Requests niece, Carrie [name], as health care POA   Would accept resuscitation attempts   No tube feeds if cognitively aware   Review of Systems Not sleeping well---despite using the lorazepam, also prn in day for anxiety Less nocturia    Objective:   Physical Exam  Constitutional: She appears well-developed. No distress.  Neck: No thyromegaly present.  Cardiovascular: Normal rate, regular rhythm, normal heart sounds and intact distal pulses. Exam reveals no gallop.  No murmur heard. Respiratory: Effort normal and breath sounds normal. No respiratory distress. She has no wheezes. She has no rales.  Musculoskeletal:        General: No tenderness or edema.  Lymphadenopathy:    She has no cervical adenopathy.  Skin: No rash noted.  No foot lesions  Psychiatric: She has a normal mood and affect. Her behavior is normal.           Assessment & Plan:

## 2018-06-23 NOTE — Assessment & Plan Note (Signed)
Remains on keppra as seizure prophylaxis

## 2018-06-23 NOTE — Assessment & Plan Note (Signed)
Getting ready for evaluation for shunt

## 2018-06-23 NOTE — Assessment & Plan Note (Signed)
Working on improving eating

## 2018-06-29 ENCOUNTER — Other Ambulatory Visit: Payer: Self-pay | Admitting: Internal Medicine

## 2018-07-11 ENCOUNTER — Other Ambulatory Visit: Payer: Self-pay | Admitting: Internal Medicine

## 2018-07-11 DIAGNOSIS — Z1231 Encounter for screening mammogram for malignant neoplasm of breast: Secondary | ICD-10-CM

## 2018-07-16 DIAGNOSIS — N2581 Secondary hyperparathyroidism of renal origin: Secondary | ICD-10-CM | POA: Diagnosis not present

## 2018-07-16 DIAGNOSIS — E1122 Type 2 diabetes mellitus with diabetic chronic kidney disease: Secondary | ICD-10-CM | POA: Diagnosis not present

## 2018-07-16 DIAGNOSIS — N185 Chronic kidney disease, stage 5: Secondary | ICD-10-CM | POA: Diagnosis not present

## 2018-07-16 DIAGNOSIS — R809 Proteinuria, unspecified: Secondary | ICD-10-CM | POA: Diagnosis not present

## 2018-07-21 ENCOUNTER — Other Ambulatory Visit (INDEPENDENT_AMBULATORY_CARE_PROVIDER_SITE_OTHER): Payer: Self-pay | Admitting: Vascular Surgery

## 2018-07-21 ENCOUNTER — Encounter (INDEPENDENT_AMBULATORY_CARE_PROVIDER_SITE_OTHER): Payer: Self-pay | Admitting: Vascular Surgery

## 2018-07-21 ENCOUNTER — Ambulatory Visit (INDEPENDENT_AMBULATORY_CARE_PROVIDER_SITE_OTHER): Payer: Medicare HMO | Admitting: Vascular Surgery

## 2018-07-21 ENCOUNTER — Ambulatory Visit (INDEPENDENT_AMBULATORY_CARE_PROVIDER_SITE_OTHER): Payer: Medicare HMO

## 2018-07-21 VITALS — BP 153/71 | HR 88 | Resp 16 | Ht 63.0 in | Wt 254.4 lb

## 2018-07-21 DIAGNOSIS — N185 Chronic kidney disease, stage 5: Secondary | ICD-10-CM

## 2018-07-21 DIAGNOSIS — E1121 Type 2 diabetes mellitus with diabetic nephropathy: Secondary | ICD-10-CM

## 2018-07-21 DIAGNOSIS — N186 End stage renal disease: Secondary | ICD-10-CM

## 2018-07-21 DIAGNOSIS — N184 Chronic kidney disease, stage 4 (severe): Secondary | ICD-10-CM

## 2018-07-21 DIAGNOSIS — E1122 Type 2 diabetes mellitus with diabetic chronic kidney disease: Secondary | ICD-10-CM | POA: Diagnosis not present

## 2018-07-21 DIAGNOSIS — E7800 Pure hypercholesterolemia, unspecified: Secondary | ICD-10-CM | POA: Diagnosis not present

## 2018-07-21 NOTE — Progress Notes (Signed)
Subjective:    Patient ID: Carrie [name], female    DOB: [birth year], 68 y.o.   MRN: [id number] Chief Complaint  Patient presents with  . New Patient (Initial Visit)    ref kolluru for vein mapping    Presents as a new patient referred by Dr. Juleen China for creation of a dialysis access.  The patient has a known history of chronic kidney disease which is now progressed to stage V.  At this time, the patient denies any uremic symptoms however will need to have a adequate dialysis access created if the patient should continue to progress and need dialysis.  The patient underwent bilateral upper extremity vein mapping.  Bilateral upper extremities with triphasic arterial blood flow.  Vein mapping was amenable for a left brachiobasilic stage I creation versus a left brachial axillary graft creation.  The patient was not a candidate for a Wavelinq fistula creation.  The patient is right-hand dominant.  The patient has not started dialysis yet.  The patient understands that if we move forward with a left brachiobasilic AV fistula creation this would be stage I and she would have to return to the OR approximately 4 weeks later and undergo a basilic transposition.  Patient denies any fever, nausea vomiting.  Moving forward the patient was instructed not to undergo any blood pressures IVs or blood draws to the left upper extremity.  Review of Systems  Constitutional: Negative.   HENT: Negative.   Eyes: Negative.   Respiratory: Negative.   Cardiovascular: Negative.   Gastrointestinal: Negative.   Endocrine: Negative.   Genitourinary: Negative.   Musculoskeletal: Negative.   Skin: Negative.   Allergic/Immunologic: Negative.   Neurological: Negative.   Hematological: Negative.   Psychiatric/Behavioral: Negative.       Objective:   Physical Exam Vitals signs reviewed.  Constitutional:      Appearance: Normal appearance. She is normal weight.  HENT:     Head: Normocephalic and atraumatic.   Right Ear: External ear normal.     Left Ear: External ear normal.     Nose: Nose normal.     Mouth/Throat:     Mouth: Mucous membranes are moist.     Pharynx: Oropharynx is clear.  Eyes:     Extraocular Movements: Extraocular movements intact.     Conjunctiva/sclera: Conjunctivae normal.     Pupils: Pupils are equal, round, and reactive to light.  Neck:     Musculoskeletal: Normal range of motion.  Cardiovascular:     Rate and Rhythm: Normal rate and regular rhythm.     Pulses: Normal pulses.     Heart sounds: Normal heart sounds.  Musculoskeletal: Normal range of motion.        General: No swelling.  Skin:    General: Skin is warm and dry.  Neurological:     General: No focal deficit present.     Mental Status: She is alert and oriented to person, place, and time. Mental status is at baseline.  Psychiatric:        Mood and Affect: Mood normal.        Behavior: Behavior normal.        Thought Content: Thought content normal.        Judgment: Judgment normal.    BP (!) 153/71 (BP Location: Right Arm)   Pulse 88   Resp 16   Ht 5\' 3"  (1.6 m)   Wt 254 lb 6.4 oz (115.4 kg)   BMI 45.06 kg/m  Past Medical History:  Diagnosis Date  . Allergy   . Arthritis    "right knee" (10/26'/2017)  . CKD (chronic kidney disease), stage III ([place])    12/2015 (Dr. Lavonia Dana)  . Depression   . History of blood transfusion 2008   "when I had brain tumor surgery"  . History of MRSA infection 2008  . Hypertension   . 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 Type II diabetes mellitus ([place]) 2011   on metformin until yr ago- no med now - off due to kidneys   Social History   Socioeconomic History  . Marital status: Widowed    Spouse name: Not on file  . Number of children: 1  . Years of education: Not on file  . Highest education level: Not on file  Occupational History  . 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 Occupation: Disabled since brain surgery  Social  Needs  . Financial resource strain: Not on file  . Food insecurity:    Worry: Not on file    Inability: Not on file  . Transportation needs:    Medical: Not on file    Non-medical: Not on file  Tobacco Use  . Smoking status: Never Smoker  . Smokeless tobacco: Never Used  Substance and Sexual Activity  . Alcohol use: No    Alcohol/week: 0.0 standard drinks  . Drug use: No  . Sexual activity: Never  Lifestyle  . Physical activity:    Days per week: Not on file    Minutes per session: Not on file  . Stress: Not on file  Relationships  . Social connections:    Talks on phone: Not on file    Gets together: Not on file    Attends religious service: Not on file    Active member of club or organization: Not on file    Attends meetings of clubs or organizations: Not on file    Relationship status: Not on file  . Intimate partner violence:    Fear of current or ex partner: Not on file    Emotionally abused: Not on file    Physically abused: Not on file    Forced sexual activity: Not on file  Other Topics Concern  . Not on file  Social History Narrative   Lives in family home with extended family.      No living will   Requests niece, [name], as health care POA   Would accept resuscitation attempts   No tube feeds if cognitively aware   Past Surgical History:  Procedure Laterality Date  . BRAIN MENINGIOMA EXCISION  08/2006   Foramina magnum meningioma  . CERVICAL DISCECTOMY  1996   Dr. Alric Seton  . CESAREAN SECTION  1981  . COLONOSCOPY    . DILATION AND CURETTAGE OF UTERUS    . JOINT REPLACEMENT    . Right wrist x-ray  2007   Deg. at 1st MCP  . TOTAL HIP ARTHROPLASTY Left 03/27/2016   Procedure: LEFT TOTAL HIP ARTHROPLASTY ANTERIOR APPROACH;  Surgeon: Mcarthur Rossetti, MD;  Location: [facility];  Service: Orthopedics;  Laterality: Left;  . TOTAL KNEE ARTHROPLASTY Right 04/30/2017   Procedure: RIGHT TOTAL KNEE ARTHROPLASTY;  Surgeon: Mcarthur Rossetti, MD;   Location: [facility];  Service: Orthopedics;  Laterality: Right;  . TUBAL LIGATION  1981   Family History  Problem Relation Age of Onset  . Hypertension Other        Strong family  history   . Breast cancer Mother 49  . Heart failure Sister   . Heart disease Sister    Allergies  Allergen Reactions  . Naproxen Sodium Anaphylaxis and Swelling  . Sulfamethoxazole-Trimethoprim Anaphylaxis and Swelling      Assessment & Plan:   Presents as a new patient referred by Dr. Juleen China for creation of a dialysis access.  The patient has a known history of chronic kidney disease which is now progressed to stage V.  At this time, the patient denies any uremic symptoms however will need to have a adequate dialysis access created if the patient should continue to progress and need dialysis.  The patient underwent bilateral upper extremity vein mapping.  Bilateral upper extremities with triphasic arterial blood flow.  Vein mapping was amenable for a left brachiobasilic stage I creation versus a left brachial axillary graft creation.  The patient was not a candidate for a Wavelinq fistula creation.  The patient is right-hand dominant.  The patient has not started dialysis yet.  The patient understands that if we move forward with a left brachiobasilic AV fistula creation this would be stage I and she would have to return to the OR approximately 4 weeks later and undergo a basilic transposition.  Patient denies any fever, nausea vomiting.  Moving forward the patient was instructed not to undergo any blood pressures IVs or blood draws to the left upper extremity.  1. Chronic kidney disease, stage IV (severe) ([place]) - New The patient has a known history of chronic kidney disease which is now progressed to stage V The patient will need a adequate dialysis access if her kidney function continues to worsen and she progressed to the point where she will need hemodialysis Patient underwent bilateral upper extremity vein mapping  which was notable for triphasic blood flow. The patient's veins were am able for a left brachial basilic AV fistula creation versus a left brachial axillary graft creation The patient is right-hand dominant The patient understands that if we create a left brachiobasilic AV fistula this is a stage I and she will return to the operating room approximately 4 weeks later to undergo a left basilic transposition. The patient expresses her understanding The patient will not undergo any blood pressure, IV or blood draws from the left upper extremity  2. Controlled type 2 diabetes mellitus with diabetic nephropathy, without long-term current use of insulin (HCC) - Stable Encouraged good control as its slows the progression of atherosclerotic disease  3. HYPERCHOLESTEROLEMIA - Stable Encouraged good control as its slows the progression of atherosclerotic disease  Current Outpatient Medications on File Prior to Visit  Medication Sig Dispense Refill  . amLODipine (NORVASC) 10 MG tablet TAKE 1 TABLET BY MOUTH EVERY DAY 90 tablet 3  . atorvastatin (LIPITOR) 40 MG tablet TAKE 40 MG BY MOUTH ONCE DAILY 90 tablet 3  . levETIRAcetam (KEPPRA) 1000 MG tablet TAKE ONE TABLET BY MOUTH TWICE DAILY 180 tablet 3  . LORazepam (ATIVAN) 0.5 MG tablet TAKE 1/2 TO 1 TABLET BY MOUTH 3 TIMES A DAY AS NEEDED 60 tablet 0  . losartan (COZAAR) 50 MG tablet Take 50 mg by mouth daily.    . metoprolol succinate (TOPROL-XL) 25 MG 24 hr tablet TAKE 1 TABLET BY MOUTH EVERY DAY 30 tablet 8  . NON FORMULARY Doterra 1 tablet daily.    [name] torsemide (DEMADEX) 10 MG tablet Take 10 mg by mouth daily.     No current facility-administered medications on file prior to visit.  There are no Patient Instructions on file for this visit. No follow-ups on file.  Liylah Najarro A Piper Hassebrock, PA-C

## 2018-07-23 DIAGNOSIS — E1122 Type 2 diabetes mellitus with diabetic chronic kidney disease: Secondary | ICD-10-CM | POA: Diagnosis not present

## 2018-07-23 DIAGNOSIS — N185 Chronic kidney disease, stage 5: Secondary | ICD-10-CM | POA: Diagnosis not present

## 2018-07-23 DIAGNOSIS — N2581 Secondary hyperparathyroidism of renal origin: Secondary | ICD-10-CM | POA: Diagnosis not present

## 2018-07-23 DIAGNOSIS — R809 Proteinuria, unspecified: Secondary | ICD-10-CM | POA: Diagnosis not present

## 2018-07-24 NOTE — Progress Notes (Signed)
Cardiology Office Note Date:  07/28/2018  Patient ID:  Nallely, Yost [birth year], MRN [id number] PCP:  Venia Carbon, MD  Cardiologist:  Dr. Rockey Situ, MD    Chief Complaint: Preoperative cardiac evaluation  History of Present Illness: AYVA VEILLEUX is a 68 y.o. female with history of chronic diastolic CHF, meningioma status post surgical intervention in 2008, end-stage renal disease not currently on dialysis, DM2 with diabetic nephropathy, HTN, HLD, morbid obesity, anemia, chronic neck pain who presents for preoperative cardiac evaluation for needed vascular access for dialysis.  Patient reports a prior stress test in 2008 that was normal.  Patient was evaluated by Dr. Rockey Situ in 11/2017 for worsening shortness of breath after she had recently been started on Lasix by outside physician.  At that time, she reported high fluid intake including water and diet Pepsi.  She was noted to sleep in a recliner.  Recent echo from 10/2017 showed normal LV systolic function with an EF of 60 to 65%, no regional wall motion abnormalities, mild LVH, grade 1 diastolic dysfunction, moderately dilated left atrium, borderline mild aortic valve stenosis, unable to estimate right heart pressures.  The patient's kidney disease has now progressed to stage V.  She recently underwent bilateral upper extremity vein mapping for upcoming vascular access.  She was seen by vascular surgery on 07/21/2017 for initial evaluation of this.  She presents today for preoperative cardiac evaluation for vascular access.  Labs: 06/2018 - A1c 5.7 12/2017 - BNP 161, Hgb 10.2, LDL 113, potassium 4.0, serum creatinine 3.62, AST/ALT normal  She comes in accompanied by her sister today and is doing reasonably well from a cardiac perspective.  She denies any chest pain, dizziness, presyncope, or syncope.  She does note progression of exertional dyspnea since she was last evaluated.  She has also recently been evaluated by PCP for a  chronic, nonproductive cough that was improved with cough syrup.  She also notes improvement with the cough by placing ice along the right side of her neck.  No lower extremity swelling, abdominal distention, orthopnea (though patient does sleep in a recliner following surgical intervention of her meningioma in 2008), PND, or early satiety.  She continues to note good urine output with low-dose torsemide.  She does not check her blood pressure at home.  Per [facility] Activity Status Index she is able to achieve 10.7 METs.   Past Medical History:  Diagnosis Date  . Allergy   . Arthritis    "right knee" (10/26'/2017)  . CKD (chronic kidney disease), stage III ([place])    12/2015 (Dr. Lavonia Dana)  . Depression   . History of blood transfusion 2008   "when I had brain tumor surgery"  . History of MRSA infection 2008  . Hypertension   . Meningioma ([place])    Foramen magnum  . Type II diabetes mellitus ([place]) 2011   on metformin until yr ago- no med now - off due to kidneys    Past Surgical History:  Procedure Laterality Date  . BRAIN MENINGIOMA EXCISION  08/2006   Foramina magnum meningioma  . CERVICAL DISCECTOMY  1996   Dr. Alric Seton  . CESAREAN SECTION  1981  . COLONOSCOPY    . DILATION AND CURETTAGE OF UTERUS    . JOINT REPLACEMENT    . Right wrist x-ray  2007   Deg. at 1st MCP  . TOTAL HIP ARTHROPLASTY Left 03/27/2016   Procedure: LEFT TOTAL HIP ARTHROPLASTY ANTERIOR APPROACH;  Surgeon: Mcarthur Rossetti,  MD;  Location: [facility];  Service: Orthopedics;  Laterality: Left;  . TOTAL KNEE ARTHROPLASTY Right 04/30/2017   Procedure: RIGHT TOTAL KNEE ARTHROPLASTY;  Surgeon: Mcarthur Rossetti, MD;  Location: [facility];  Service: Orthopedics;  Laterality: Right;  . TUBAL LIGATION  1981    Current Meds  Medication Sig  . amLODipine (NORVASC) 10 MG tablet TAKE 1 TABLET BY MOUTH EVERY DAY  . atorvastatin (LIPITOR) 40 MG tablet TAKE 40 MG BY MOUTH ONCE DAILY  . calcitRIOL (ROCALTROL) 0.25 MCG  capsule Take 0.25 mcg by mouth daily.   [name] levETIRAcetam (KEPPRA) 1000 MG tablet TAKE ONE TABLET BY MOUTH TWICE DAILY  . LORazepam (ATIVAN) 0.5 MG tablet TAKE 1/2 TO 1 TABLET BY MOUTH 3 TIMES A DAY AS NEEDED  . losartan (COZAAR) 50 MG tablet Take 50 mg by mouth daily.  . metoprolol succinate (TOPROL-XL) 25 MG 24 hr tablet TAKE 1 TABLET BY MOUTH EVERY DAY  . NON FORMULARY Doterra 1 tablet daily.  . sodium bicarbonate 650 MG tablet Take 1,300 mg by mouth 2 (two) times daily.   [name] torsemide (DEMADEX) 10 MG tablet Take 10 mg by mouth daily.    Allergies:   Naproxen sodium and Sulfamethoxazole-trimethoprim   Social History:  The patient  reports that she has never smoked. She has never used smokeless tobacco. She reports that she does not drink alcohol or use drugs.   Family History:  The patient's family history includes Breast cancer (age of onset: 18) in her mother; Heart disease in her sister; Heart failure in her sister; Hypertension in an other family member.  ROS:   Review of Systems  Constitutional: Positive for malaise/fatigue. Negative for chills, diaphoresis, fever and weight loss.  HENT: Negative for congestion.   Eyes: Negative for discharge and redness.  Respiratory: Positive for cough and shortness of breath. Negative for hemoptysis, sputum production and wheezing.   Cardiovascular: Negative for chest pain, palpitations, orthopnea, claudication, leg swelling and PND.  Gastrointestinal: Negative for abdominal pain, blood in stool, heartburn, melena, nausea and vomiting.  Genitourinary: Negative for hematuria.  Musculoskeletal: Negative for falls and myalgias.  Skin: Negative for rash.  Neurological: Positive for weakness. Negative for dizziness, tingling, tremors, sensory change, speech change, focal weakness and loss of consciousness.  Endo/Heme/Allergies: Does not bruise/bleed easily.  Psychiatric/Behavioral: Negative for substance abuse. The patient is not nervous/anxious.     All other systems reviewed and are negative.    PHYSICAL EXAM:  VS:  BP 140/70 (BP Location: Right Arm, Patient Position: Sitting, Cuff Size: Large)   Pulse 78   Ht 5\' 3"  (1.6 m)   Wt 254 lb (115.2 kg)   BMI 44.99 kg/m  BMI: Body mass index is 44.99 kg/m.  Physical Exam  Constitutional: She is oriented to person, place, and time. She appears well-developed and well-nourished.  HENT:  Head: Normocephalic and atraumatic.  Eyes: Right eye exhibits no discharge. Left eye exhibits no discharge.  Neck: Normal range of motion. No JVD present.  Cardiovascular: Normal rate, regular rhythm, S1 normal, S2 normal and normal heart sounds. Exam reveals no distant heart sounds, no friction rub, no midsystolic click and no opening snap.  No murmur heard. Pulses:      Posterior tibial pulses are 2+ on the right side and 2+ on the left side.  Pulmonary/Chest: Effort normal and breath sounds normal. No respiratory distress. She has no decreased breath sounds. She has no wheezes. She has no rales. She exhibits no tenderness.  Abdominal: Soft. She exhibits no distension. There is no abdominal tenderness.  Musculoskeletal:        General: No edema.  Neurological: She is alert and oriented to person, place, and time.  Skin: Skin is warm and dry. No cyanosis. Nails show no clubbing.  Psychiatric: She has a normal mood and affect. Her speech is normal and behavior is normal. Judgment and thought content normal.     EKG:  Was ordered and interpreted by me today. Shows NSR, 78 bpm, normal axis, no acute ST-T changes  Recent Labs: 12/20/2017: ALT 10; BUN 51; Creatinine, Ser 3.62; Hemoglobin 10.2; Platelets 366.0; Potassium 4.0; Pro B Natriuretic peptide (BNP) 161.0; Sodium 141  12/20/2017: Cholesterol 178; HDL 48.70; LDL Cholesterol 113; Total CHOL/HDL Ratio 4; Triglycerides 85.0; VLDL 17.0   CrCl cannot be calculated (Patient's most recent lab result is older than the maximum 21 days allowed.).   Wt  Readings from Last 3 Encounters:  07/28/18 254 lb (115.2 kg)  07/21/18 254 lb 6.4 oz (115.4 kg)  06/23/18 252 lb (114.3 kg)     Other studies reviewed: Additional studies/records reviewed today include: summarized above  ASSESSMENT AND PLAN:  1. Preoperative cardiac evaluation: Per revised cardiac index, patient would be at low risk for noncardiac surgery.  She is able to achieve 10.7 METs.  Given her exertional dyspnea, we will evaluate for high risk ischemia as outlined below.  If this is unrevealing, she will be acceptable risk for low risk noncardiac surgery.  2. Exertional dyspnea: Never with chest pain.  Cannot exclude ischemic heart disease.  In this setting, we discussed noninvasive and invasive cardiac testing.  Unfortunately, given her underlying CKD, she is not an ideal candidate for cardiac CT with FFR or cardiac cath.  Ultimately, we will need to evaluate for ischemic heart disease with a Lexiscan Myoview to evaluate for high risk ischemia.  However, I did discuss with the patient there are limitations with this noninvasive testing given her body habitus and breast tissue.  Cannot exclude morbid obesity, physical deconditioning, and anemia of chronic disease playing a role in her symptoms.  3. HFpEF: Volume status is somewhat difficult to assess secondary to body habitus however, her weight is stable and she appears relatively euvolemic on exam.  Continue low-dose torsemide.  Moving forward, it appears volume management will be per hemodialysis.  4. CKD stage V: Followed by nephrology.  Planning to have vascular access for hemodialysis.  5. Hypertension: Blood pressure reasonably controlled today.  Continue current medications.  6. Hyperlipidemia: LDL of 113 from 12/2017.  Remains on Lipitor 40 mg daily.  7. Anemia of chronic disease: Most recent CBC from 12/2017 demonstrated stable hemoglobin.  Followed by PCP.  Disposition: F/u with Dr. Rockey Situ or an APP in 3 months.  Current  medicines are reviewed at length with the patient today.  The patient did not have any concerns regarding medicines.  Signed, Christell Faith, PA-C 07/28/2018 9:07 AM     [address] [place], [place] [address] [phone]

## 2018-07-28 ENCOUNTER — Ambulatory Visit: Payer: Medicare HMO | Admitting: Physician Assistant

## 2018-07-28 ENCOUNTER — Encounter: Payer: Self-pay | Admitting: Physician Assistant

## 2018-07-28 VITALS — BP 140/70 | HR 78 | Ht 63.0 in | Wt 254.0 lb

## 2018-07-28 DIAGNOSIS — R0609 Other forms of dyspnea: Secondary | ICD-10-CM

## 2018-07-28 DIAGNOSIS — Z01810 Encounter for preprocedural cardiovascular examination: Secondary | ICD-10-CM | POA: Diagnosis not present

## 2018-07-28 DIAGNOSIS — I5032 Chronic diastolic (congestive) heart failure: Secondary | ICD-10-CM

## 2018-07-28 DIAGNOSIS — E782 Mixed hyperlipidemia: Secondary | ICD-10-CM

## 2018-07-28 DIAGNOSIS — N185 Chronic kidney disease, stage 5: Secondary | ICD-10-CM | POA: Diagnosis not present

## 2018-07-28 DIAGNOSIS — I10 Essential (primary) hypertension: Secondary | ICD-10-CM

## 2018-07-28 NOTE — Patient Instructions (Signed)
Medication Instructions:  Your physician recommends that you continue on your current medications as directed. Please refer to the Current Medication list given to you today.  If you need a refill on your cardiac medications before your next appointment, please call your pharmacy.   Lab work: None ordered  If you have labs (blood work) drawn today and your tests are completely normal, you will receive your results only by: [name] MyChart Message (if you have MyChart) OR . A paper copy in the mail If you have any lab test that is abnormal or we need to change your treatment, we will call you to review the results.  Testing/Procedures: 1- [place]  Your caregiver has ordered a Stress Test with nuclear imaging. The purpose of this test is to evaluate the blood supply to your heart muscle. This procedure is referred to as a "Non-Invasive Stress Test." This is because other than having an IV started in your vein, nothing is inserted or "invades" your body. Cardiac stress tests are done to find areas of poor blood flow to the heart by determining the extent of coronary artery disease (CAD). Some patients exercise on a treadmill, which naturally increases the blood flow to your heart, while others who are  unable to walk on a treadmill due to physical limitations have a pharmacologic/chemical stress agent called Lexiscan . This medicine will mimic walking on a treadmill by temporarily increasing your coronary blood flow.   Please note: these test may take anywhere between 2-4 hours to complete  PLEASE REPORT TO [facility] AT THE FIRST DESK WILL DIRECT YOU WHERE TO GO  Date of Procedure:_____________________________________  Arrival Time for Procedure:______________________________  Instructions regarding medication:   __x__ : Hold diuretic medication morning of procedure (torsemide)  __x__:  Hold betablocker(s) night before procedure and morning of  procedure (metoprolol)  ________________________________  PLEASE NOTIFY THE OFFICE AT LEAST 24 HOURS IN ADVANCE IF YOU ARE UNABLE TO KEEP YOUR APPOINTMENT.  [phone] AND  PLEASE NOTIFY NUCLEAR MEDICINE AT [facility] AT LEAST 24 HOURS IN ADVANCE IF YOU ARE UNABLE TO KEEP YOUR APPOINTMENT. [phone]  How to prepare for your Myoview test:  1. Do not eat or drink after midnight 2. No caffeine for 24 hours prior to test 3. No smoking 24 hours prior to test. 4. Your medication may be taken with water.  If your doctor stopped a medication because of this test, do not take that medication. 5. Ladies, please do not wear dresses.  Skirts or pants are appropriate. Please wear a short sleeve shirt. 6. No perfume, cologne or lotion. 7. Wear comfortable walking shoes. No heels!   Follow-Up: At [facility], you and your health needs are our priority.  As part of our continuing mission to provide you with exceptional heart care, we have created designated Provider Care Teams.  These Care Teams include your primary Cardiologist (physician) and Advanced Practice Providers (APPs -  Physician Assistants and Nurse Practitioners) who all work together to provide you with the care you need, when you need it. You will need a follow up appointment in 3 months. You may see Dr. Rockey Situ or Christell Faith, PA-C

## 2018-07-30 ENCOUNTER — Telehealth: Payer: Self-pay | Admitting: Internal Medicine

## 2018-07-30 DIAGNOSIS — I120 Hypertensive chronic kidney disease with stage 5 chronic kidney disease or end stage renal disease: Secondary | ICD-10-CM | POA: Diagnosis not present

## 2018-07-30 DIAGNOSIS — N2581 Secondary hyperparathyroidism of renal origin: Secondary | ICD-10-CM | POA: Diagnosis not present

## 2018-07-30 DIAGNOSIS — R809 Proteinuria, unspecified: Secondary | ICD-10-CM | POA: Diagnosis not present

## 2018-07-30 DIAGNOSIS — E1122 Type 2 diabetes mellitus with diabetic chronic kidney disease: Secondary | ICD-10-CM | POA: Diagnosis not present

## 2018-07-30 DIAGNOSIS — N185 Chronic kidney disease, stage 5: Secondary | ICD-10-CM | POA: Diagnosis not present

## 2018-07-30 NOTE — Telephone Encounter (Signed)
Spoke to pt. She said the kidney specialist has told her she needs to sleep in a reclining position. She has been sleeping in a recliner. I told her I could not remember if she need to have an office visit for insurance purposes.

## 2018-07-30 NOTE — Telephone Encounter (Signed)
Spoke to pt. She is going in the hospital  08-04-18. She will talk to the kidney specialist, again. She will also try and ask a Education officer, museum at the hospital. If not, she will make an OV here.

## 2018-07-30 NOTE — Telephone Encounter (Signed)
A hospital bed does require a face to face. If she can't wait till I get back, can try someone else Rollene Fare?)

## 2018-07-30 NOTE — Telephone Encounter (Signed)
Pt need to speak to nurse concerning needing a hospital bed because she is getting ready to start dialysis. Pt spoke to [facility] and they told her the PCP would have to start the process. Please advise pt.

## 2018-08-01 ENCOUNTER — Ambulatory Visit (HOSPITAL_BASED_OUTPATIENT_CLINIC_OR_DEPARTMENT_OTHER)
Admission: RE | Admit: 2018-08-01 | Discharge: 2018-08-01 | Disposition: A | Discharge status: Home / Self Care | Payer: Medicare HMO | Source: Ambulatory Visit | Attending: Physician Assistant | Admitting: Physician Assistant

## 2018-08-01 DIAGNOSIS — Z992 Dependence on renal dialysis: Secondary | ICD-10-CM | POA: Diagnosis not present

## 2018-08-01 DIAGNOSIS — G40909 Epilepsy, unspecified, not intractable, without status epilepticus: Secondary | ICD-10-CM | POA: Diagnosis present

## 2018-08-01 DIAGNOSIS — D631 Anemia in chronic kidney disease: Secondary | ICD-10-CM | POA: Diagnosis present

## 2018-08-01 DIAGNOSIS — E785 Hyperlipidemia, unspecified: Secondary | ICD-10-CM | POA: Diagnosis present

## 2018-08-01 DIAGNOSIS — N186 End stage renal disease: Secondary | ICD-10-CM | POA: Diagnosis not present

## 2018-08-01 DIAGNOSIS — N19 Unspecified kidney failure: Secondary | ICD-10-CM | POA: Diagnosis not present

## 2018-08-01 DIAGNOSIS — E559 Vitamin D deficiency, unspecified: Secondary | ICD-10-CM | POA: Diagnosis present

## 2018-08-01 DIAGNOSIS — Z86011 Personal history of benign neoplasm of the brain: Secondary | ICD-10-CM | POA: Diagnosis not present

## 2018-08-01 DIAGNOSIS — I5032 Chronic diastolic (congestive) heart failure: Secondary | ICD-10-CM | POA: Diagnosis present

## 2018-08-01 DIAGNOSIS — E1122 Type 2 diabetes mellitus with diabetic chronic kidney disease: Secondary | ICD-10-CM | POA: Diagnosis not present

## 2018-08-01 DIAGNOSIS — Z8249 Family history of ischemic heart disease and other diseases of the circulatory system: Secondary | ICD-10-CM | POA: Diagnosis not present

## 2018-08-01 DIAGNOSIS — N2581 Secondary hyperparathyroidism of renal origin: Secondary | ICD-10-CM | POA: Diagnosis present

## 2018-08-01 DIAGNOSIS — F419 Anxiety disorder, unspecified: Secondary | ICD-10-CM | POA: Diagnosis present

## 2018-08-01 DIAGNOSIS — Z96651 Presence of right artificial knee joint: Secondary | ICD-10-CM | POA: Diagnosis present

## 2018-08-01 DIAGNOSIS — R0609 Other forms of dyspnea: Secondary | ICD-10-CM

## 2018-08-01 DIAGNOSIS — Z886 Allergy status to analgesic agent status: Secondary | ICD-10-CM | POA: Diagnosis not present

## 2018-08-01 DIAGNOSIS — I10 Essential (primary) hypertension: Secondary | ICD-10-CM | POA: Diagnosis not present

## 2018-08-01 DIAGNOSIS — I120 Hypertensive chronic kidney disease with stage 5 chronic kidney disease or end stage renal disease: Secondary | ICD-10-CM | POA: Diagnosis not present

## 2018-08-01 DIAGNOSIS — Z882 Allergy status to sulfonamides status: Secondary | ICD-10-CM | POA: Diagnosis not present

## 2018-08-01 DIAGNOSIS — Z79899 Other long term (current) drug therapy: Secondary | ICD-10-CM | POA: Diagnosis not present

## 2018-08-01 DIAGNOSIS — I132 Hypertensive heart and chronic kidney disease with heart failure and with stage 5 chronic kidney disease, or end stage renal disease: Secondary | ICD-10-CM | POA: Diagnosis present

## 2018-08-01 DIAGNOSIS — Z96642 Presence of left artificial hip joint: Secondary | ICD-10-CM | POA: Diagnosis present

## 2018-08-01 DIAGNOSIS — Z803 Family history of malignant neoplasm of breast: Secondary | ICD-10-CM | POA: Diagnosis not present

## 2018-08-01 DIAGNOSIS — R0602 Shortness of breath: Secondary | ICD-10-CM | POA: Diagnosis not present

## 2018-08-01 LAB — NM MYOCAR MULTI W/SPECT W/WALL MOTION / EF
CHL CUP NUCLEAR SRS: 22
CSEPHR: 66 %
Estimated workload: 1 METS
Exercise duration (min): 0 min
Exercise duration (sec): 0 s
LV dias vol: 82 mL (ref 46–106)
LV sys vol: 22 mL
MPHR: 151 {beats}/min
Peak HR: 100 {beats}/min
Rest HR: 83 {beats}/min
SDS: 1
SSS: 14
TID: 0.65

## 2018-08-01 MED ORDER — TECHNETIUM TC 99M TETROFOSMIN IV KIT
10.0000 | PACK | Freq: Once | INTRAVENOUS | Status: AC | PRN
  Administered 2018-08-01: 10.83 via INTRAVENOUS

## 2018-08-01 MED ORDER — REGADENOSON 0.4 MG/5ML IV SOLN
0.4000 mg | Freq: Once | INTRAVENOUS | Status: AC
  Administered 2018-08-01: 0.4 mg via INTRAVENOUS

## 2018-08-01 MED ORDER — TECHNETIUM TC 99M TETROFOSMIN IV KIT
30.0000 | PACK | Freq: Once | INTRAVENOUS | Status: AC | PRN
  Administered 2018-08-01: 30.42 via INTRAVENOUS

## 2018-08-04 ENCOUNTER — Encounter
Admission: AD | Disposition: A | Discharge status: Home Health | Payer: Self-pay | Source: Ambulatory Visit | Attending: Internal Medicine

## 2018-08-04 ENCOUNTER — Telehealth: Payer: Self-pay

## 2018-08-04 ENCOUNTER — Other Ambulatory Visit: Payer: Self-pay

## 2018-08-04 ENCOUNTER — Inpatient Hospital Stay
Admission: AD | Admit: 2018-08-04 | Discharge: 2018-08-07 | DRG: 291 | Disposition: A | Discharge status: Home Health | Payer: Medicare HMO | Source: Ambulatory Visit | Attending: Internal Medicine | Admitting: Internal Medicine

## 2018-08-04 ENCOUNTER — Encounter: Payer: Self-pay | Admitting: *Deleted

## 2018-08-04 DIAGNOSIS — I120 Hypertensive chronic kidney disease with stage 5 chronic kidney disease or end stage renal disease: Secondary | ICD-10-CM | POA: Diagnosis not present

## 2018-08-04 DIAGNOSIS — Z886 Allergy status to analgesic agent status: Secondary | ICD-10-CM | POA: Diagnosis not present

## 2018-08-04 DIAGNOSIS — D631 Anemia in chronic kidney disease: Secondary | ICD-10-CM | POA: Diagnosis present

## 2018-08-04 DIAGNOSIS — Z96651 Presence of right artificial knee joint: Secondary | ICD-10-CM | POA: Diagnosis present

## 2018-08-04 DIAGNOSIS — E1122 Type 2 diabetes mellitus with diabetic chronic kidney disease: Secondary | ICD-10-CM | POA: Diagnosis present

## 2018-08-04 DIAGNOSIS — R0602 Shortness of breath: Secondary | ICD-10-CM

## 2018-08-04 DIAGNOSIS — Z882 Allergy status to sulfonamides status: Secondary | ICD-10-CM

## 2018-08-04 DIAGNOSIS — Z96642 Presence of left artificial hip joint: Secondary | ICD-10-CM | POA: Diagnosis present

## 2018-08-04 DIAGNOSIS — Z803 Family history of malignant neoplasm of breast: Secondary | ICD-10-CM | POA: Diagnosis not present

## 2018-08-04 DIAGNOSIS — N186 End stage renal disease: Secondary | ICD-10-CM | POA: Diagnosis present

## 2018-08-04 DIAGNOSIS — E559 Vitamin D deficiency, unspecified: Secondary | ICD-10-CM | POA: Diagnosis present

## 2018-08-04 DIAGNOSIS — E785 Hyperlipidemia, unspecified: Secondary | ICD-10-CM | POA: Diagnosis present

## 2018-08-04 DIAGNOSIS — N2581 Secondary hyperparathyroidism of renal origin: Secondary | ICD-10-CM | POA: Diagnosis present

## 2018-08-04 DIAGNOSIS — Z79899 Other long term (current) drug therapy: Secondary | ICD-10-CM | POA: Diagnosis not present

## 2018-08-04 DIAGNOSIS — F419 Anxiety disorder, unspecified: Secondary | ICD-10-CM | POA: Diagnosis present

## 2018-08-04 DIAGNOSIS — I132 Hypertensive heart and chronic kidney disease with heart failure and with stage 5 chronic kidney disease, or end stage renal disease: Principal | ICD-10-CM | POA: Diagnosis present

## 2018-08-04 DIAGNOSIS — Z992 Dependence on renal dialysis: Secondary | ICD-10-CM

## 2018-08-04 DIAGNOSIS — G40909 Epilepsy, unspecified, not intractable, without status epilepticus: Secondary | ICD-10-CM | POA: Diagnosis present

## 2018-08-04 DIAGNOSIS — Z8249 Family history of ischemic heart disease and other diseases of the circulatory system: Secondary | ICD-10-CM | POA: Diagnosis not present

## 2018-08-04 DIAGNOSIS — I5032 Chronic diastolic (congestive) heart failure: Secondary | ICD-10-CM | POA: Diagnosis present

## 2018-08-04 DIAGNOSIS — Z86011 Personal history of benign neoplasm of the brain: Secondary | ICD-10-CM | POA: Diagnosis not present

## 2018-08-04 HISTORY — PX: DIALYSIS/PERMA CATHETER INSERTION: CATH118288

## 2018-08-04 LAB — RENAL FUNCTION PANEL
ALBUMIN: 3.6 g/dL (ref 3.5–5.0)
Anion gap: 8 (ref 5–15)
BUN: 30 mg/dL — AB (ref 8–23)
CO2: 29 mmol/L (ref 22–32)
Calcium: 7.8 mg/dL — ABNORMAL LOW (ref 8.9–10.3)
Chloride: 105 mmol/L (ref 98–111)
Creatinine, Ser: 2.95 mg/dL — ABNORMAL HIGH (ref 0.44–1.00)
GFR calc Af Amer: 18 mL/min — ABNORMAL LOW (ref >60)
GFR calc non Af Amer: 16 mL/min — ABNORMAL LOW (ref >60)
Glucose, Bld: 94 mg/dL (ref 70–99)
PHOSPHORUS: 3.1 mg/dL (ref 2.5–4.6)
POTASSIUM: 3.8 mmol/L (ref 3.5–5.1)
Sodium: 142 mmol/L (ref 135–145)

## 2018-08-04 SURGERY — DIALYSIS/PERMA CATHETER INSERTION
Anesthesia: Moderate Sedation

## 2018-08-04 MED ORDER — ACETAMINOPHEN 650 MG RE SUPP: 650.0000 mg | Freq: Four times a day (QID) | RECTAL | Status: DC | PRN

## 2018-08-04 MED ORDER — SODIUM CHLORIDE 0.9% FLUSH
3.0000 mL | Freq: Two times a day (BID) | INTRAVENOUS | Status: DC
  Administered 2018-08-04 – 2018-08-07 (×6): 3 mL via INTRAVENOUS

## 2018-08-04 MED ORDER — METOPROLOL SUCCINATE ER 25 MG PO TB24
25.0000 mg | ORAL_TABLET | Freq: Every day | ORAL | Status: DC
  Administered 2018-08-05 – 2018-08-07 (×3): 25 mg via ORAL
  Filled 2018-08-04 (×3): qty 1

## 2018-08-04 MED ORDER — LOSARTAN POTASSIUM 50 MG PO TABS
50.0000 mg | ORAL_TABLET | Freq: Every day | ORAL | Status: DC
  Administered 2018-08-05 – 2018-08-07 (×3): 50 mg via ORAL
  Filled 2018-08-04 (×3): qty 1

## 2018-08-04 MED ORDER — HEPARIN SODIUM (PORCINE) 5000 UNIT/ML IJ SOLN
5000.0000 [IU] | Freq: Three times a day (TID) | INTRAMUSCULAR | Status: DC
  Administered 2018-08-04 – 2018-08-07 (×6): 5000 [IU] via SUBCUTANEOUS
  Filled 2018-08-04 (×6): qty 1

## 2018-08-04 MED ORDER — ATORVASTATIN CALCIUM 20 MG PO TABS
40.0000 mg | ORAL_TABLET | Freq: Every day | ORAL | Status: DC
  Administered 2018-08-05 – 2018-08-06 (×2): 40 mg via ORAL
  Filled 2018-08-04 (×2): qty 2

## 2018-08-04 MED ORDER — CALCITRIOL 0.25 MCG PO CAPS
0.2500 ug | ORAL_CAPSULE | Freq: Every day | ORAL | Status: DC
  Administered 2018-08-05 – 2018-08-07 (×3): 0.25 ug via ORAL
  Filled 2018-08-04 (×4): qty 1

## 2018-08-04 MED ORDER — CEFAZOLIN SODIUM-DEXTROSE 2-4 GM/100ML-% IV SOLN
2.0000 g | INTRAVENOUS | Status: AC
  Administered 2018-08-04: 1 g via INTRAVENOUS
  Filled 2018-08-04: qty 100

## 2018-08-04 MED ORDER — CEFAZOLIN SODIUM-DEXTROSE 2-4 GM/100ML-% IV SOLN
INTRAVENOUS | Status: AC
  Filled 2018-08-04: qty 100

## 2018-08-04 MED ORDER — FENTANYL CITRATE (PF) 100 MCG/2ML IJ SOLN
INTRAMUSCULAR | Status: AC
  Filled 2018-08-04: qty 2

## 2018-08-04 MED ORDER — LIDOCAINE-EPINEPHRINE (PF) 1 %-1:200000 IJ SOLN
INTRAMUSCULAR | Status: AC
  Filled 2018-08-04: qty 30

## 2018-08-04 MED ORDER — SODIUM CHLORIDE 0.9 % IV SOLN
INTRAVENOUS | Status: DC
  Administered 2018-08-04: 16:00:00 via INTRAVENOUS

## 2018-08-04 MED ORDER — HEPARIN SODIUM (PORCINE) 10000 UNIT/ML IJ SOLN
INTRAMUSCULAR | Status: AC
  Filled 2018-08-04: qty 1

## 2018-08-04 MED ORDER — AMLODIPINE BESYLATE 10 MG PO TABS
10.0000 mg | ORAL_TABLET | Freq: Every day | ORAL | Status: DC
  Administered 2018-08-05 – 2018-08-07 (×3): 10 mg via ORAL
  Filled 2018-08-04 (×3): qty 1

## 2018-08-04 MED ORDER — SODIUM CHLORIDE 0.9 % IV SOLN: 250.0000 mL | INTRAVENOUS | Status: DC | PRN

## 2018-08-04 MED ORDER — FENTANYL CITRATE (PF) 100 MCG/2ML IJ SOLN
INTRAMUSCULAR | Status: DC | PRN
  Administered 2018-08-04: 50 ug via INTRAVENOUS
  Administered 2018-08-04 (×2): 25 ug via INTRAVENOUS

## 2018-08-04 MED ORDER — SODIUM CHLORIDE 0.9% FLUSH: 3.0000 mL | INTRAVENOUS | Status: DC | PRN

## 2018-08-04 MED ORDER — ONDANSETRON HCL 4 MG/2ML IJ SOLN: 4.0000 mg | Freq: Four times a day (QID) | INTRAMUSCULAR | Status: DC | PRN

## 2018-08-04 MED ORDER — MIDAZOLAM HCL 5 MG/5ML IJ SOLN
INTRAMUSCULAR | Status: AC
  Filled 2018-08-04: qty 5

## 2018-08-04 MED ORDER — MIDAZOLAM HCL 2 MG/2ML IJ SOLN
INTRAMUSCULAR | Status: DC | PRN
  Administered 2018-08-04 (×2): 1 mg via INTRAVENOUS
  Administered 2018-08-04: 2 mg via INTRAVENOUS

## 2018-08-04 MED ORDER — LEVETIRACETAM 500 MG PO TABS
1000.0000 mg | ORAL_TABLET | Freq: Two times a day (BID) | ORAL | Status: DC
  Administered 2018-08-04 – 2018-08-07 (×6): 1000 mg via ORAL
  Filled 2018-08-04 (×6): qty 2

## 2018-08-04 MED ORDER — ACETAMINOPHEN 325 MG PO TABS
650.0000 mg | ORAL_TABLET | Freq: Four times a day (QID) | ORAL | Status: DC | PRN
  Administered 2018-08-05: 650 mg via ORAL
  Filled 2018-08-04: qty 2

## 2018-08-04 MED ORDER — HYDROCODONE-ACETAMINOPHEN 5-325 MG PO TABS
ORAL_TABLET | ORAL | Status: AC
  Filled 2018-08-04: qty 1

## 2018-08-04 MED ORDER — HYDROCODONE-ACETAMINOPHEN 5-325 MG PO TABS
1.0000 | ORAL_TABLET | ORAL | Status: DC | PRN
  Administered 2018-08-04: 2 via ORAL
  Administered 2018-08-04: 1 via ORAL
  Administered 2018-08-05 – 2018-08-06 (×3): 2 via ORAL
  Administered 2018-08-06: 1 via ORAL
  Filled 2018-08-04 (×3): qty 2
  Filled 2018-08-04: qty 1
  Filled 2018-08-04: qty 2

## 2018-08-04 MED ORDER — TORSEMIDE 10 MG PO TABS
10.0000 mg | ORAL_TABLET | Freq: Every day | ORAL | Status: DC
  Administered 2018-08-05 – 2018-08-07 (×3): 10 mg via ORAL
  Filled 2018-08-04 (×4): qty 1

## 2018-08-04 MED ORDER — SODIUM BICARBONATE 650 MG PO TABS
1300.0000 mg | ORAL_TABLET | Freq: Two times a day (BID) | ORAL | Status: DC
  Administered 2018-08-04 – 2018-08-07 (×6): 1300 mg via ORAL
  Filled 2018-08-04 (×6): qty 2

## 2018-08-04 MED ORDER — ONDANSETRON HCL 4 MG PO TABS: 4.0000 mg | ORAL_TABLET | Freq: Four times a day (QID) | ORAL | Status: DC | PRN

## 2018-08-04 MED ORDER — CHLORHEXIDINE GLUCONATE CLOTH 2 % EX PADS
6.0000 | MEDICATED_PAD | Freq: Every day | CUTANEOUS | Status: DC
  Administered 2018-08-05 – 2018-08-07 (×2): 6 via TOPICAL

## 2018-08-04 MED ORDER — POLYETHYLENE GLYCOL 3350 17 G PO PACK: 17.0000 g | PACK | Freq: Every day | ORAL | Status: DC | PRN

## 2018-08-04 SURGICAL SUPPLY — 10 items
ADH SKN CLS APL DERMABOND .7 (GAUZE/BANDAGES/DRESSINGS) ×1
BIOPATCH WHT 1IN DISK W/4.0 H (GAUZE/BANDAGES/DRESSINGS) ×2 IMPLANT
CATH PALINDROME RT-P 15FX19CM (CATHETERS) ×2 IMPLANT
DERMABOND ADVANCED (GAUZE/BANDAGES/DRESSINGS) ×2
DERMABOND ADVANCED .7 DNX12 (GAUZE/BANDAGES/DRESSINGS) IMPLANT
PACK ANGIOGRAPHY (CUSTOM PROCEDURE TRAY) ×2 IMPLANT
SUT MNCRL AB 4-0 PS2 18 (SUTURE) ×2 IMPLANT
SUT PROLENE 0 CT 1 30 (SUTURE) ×2 IMPLANT
SUT VICRYL+ 3-0 36IN CT-1 (SUTURE) ×2 IMPLANT
TOWEL OR 17X26 4PK STRL BLUE (TOWEL DISPOSABLE) ×2 IMPLANT

## 2018-08-04 NOTE — Progress Notes (Signed)
[facility]  ROUNDING NOTE   Subjective:   Carrie [name] admitted to [facility] for direct admit for initiation of hemodialysis.  Sister at bedside.  Patient with shortness of breath, weakness, fatigue and poor appetite.   Objective:  Vital signs in last 24 hours:  Temp:  [98.4 F (36.9 C)] 98.4 F (36.9 C) (03/02 1533) Pulse Rate:  [86] 86 (03/02 1533) Resp:  [16] 16 (03/02 1533) BP: (150)/(84) 150/84 (03/02 1533) SpO2:  [98 %] 98 % (03/02 1533)  Weight change:  There were no vitals filed for this visit.  Intake/Output: No intake/output data recorded.   Intake/Output this shift:  No intake/output data recorded.  Physical Exam: General: NAD,   Head: Normocephalic, atraumatic. Moist oral mucosal membranes  Eyes: Anicteric, PERRL  Neck: Supple, trachea midline  Lungs:  Clear to auscultation  Heart: Regular rate and rhythm  Abdomen:  Soft, nontender,   Extremities: Trace bilateral  peripheral edema.  Neurologic: Nonfocal, moving all four extremities  Skin: No lesions  Access: none    Basic Metabolic Panel: No results for input(s): NA, K, CL, CO2, GLUCOSE, BUN, CREATININE, CALCIUM, MG, PHOS in the last 168 hours.  Liver Function Tests: No results for input(s): AST, ALT, ALKPHOS, BILITOT, PROT, ALBUMIN in the last 168 hours. No results for input(s): LIPASE, AMYLASE in the last 168 hours. No results for input(s): AMMONIA in the last 168 hours.  CBC: No results for input(s): WBC, NEUTROABS, HGB, HCT, MCV, PLT in the last 168 hours.  Cardiac Enzymes: No results for input(s): CKTOTAL, CKMB, CKMBINDEX, TROPONINI in the last 168 hours.  BNP: Invalid input(s): POCBNP  CBG: No results for input(s): GLUCAP in the last 168 hours.  Microbiology: Results for orders placed or performed during the hospital encounter of 04/30/17  MRSA PCR Screening     Status: None   Collection Time: 04/30/17  8:09 PM  Result Value Ref Range Status   MRSA by PCR NEGATIVE  NEGATIVE Final    Comment:        The GeneXpert MRSA Assay (FDA approved for NASAL specimens only), is one component of a comprehensive MRSA colonization surveillance program. It is not intended to diagnose MRSA infection nor to guide or monitor treatment for MRSA infections.     Coagulation Studies: No results for input(s): LABPROT, INR in the last 72 hours.  Urinalysis: No results for input(s): COLORURINE, LABSPEC, PHURINE, GLUCOSEU, HGBUR, BILIRUBINUR, KETONESUR, PROTEINUR, UROBILINOGEN, NITRITE, LEUKOCYTESUR in the last 72 hours.  Invalid input(s): APPERANCEUR    Imaging: No results found.   Medications:   . sodium chloride    . sodium chloride    . ceFAZolin    .  ceFAZolin (ANCEF) IV     . amLODipine  10 mg Oral Daily  . atorvastatin  20 mg Oral q1800  . calcitRIOL  0.25 mcg Oral Daily  . fentaNYL      . heparin  5,000 Units Subcutaneous Q8H  . levETIRAcetam  1,000 mg Oral BID  . losartan  50 mg Oral Daily  . metoprolol succinate  25 mg Oral Daily  . midazolam      . sodium bicarbonate  1,300 mg Oral BID  . sodium chloride flush  3 mL Intravenous Q12H  . torsemide  10 mg Oral Daily   sodium chloride, acetaminophen **OR** acetaminophen, HYDROcodone-acetaminophen, ondansetron **OR** ondansetron (ZOFRAN) IV, polyethylene glycol, sodium chloride flush  Assessment/ Plan:  [name] is a 68 y.o. black female with  chronic kidney disease stage V, hypertension, migraine headache disorder, allergic rhinitis, diabetes mellitus type II, history of meningioma, osteoarthritis, anxiety, hyperlipidemia, diastolic congestive heart failure  1. Chronic Kidney Disease stage V: now requiring hemodialysis. Label end stage renal disease - Consult vascular for tunneled catheter placement - First dialysis for today.  - Check quantiferon gold and viral hepatitis panel.   2. Hypertension with edema:  Home regimen of losartan, torsemide, and amlodipine. Will hold for  now.   3. Diabetes Mellitus type II with chronic kidney disease: Currently diet controlled.   4. Secondary Hyperparathyroidism with Vitamin D deficiency: Currently on calcitriol 0.25 mcg MWF   5. Anemia with chronic kidney disease:  - EPO with HD treatment    LOS: 0 [name] 3/2/20203:58 PM

## 2018-08-04 NOTE — Telephone Encounter (Signed)
Error

## 2018-08-04 NOTE — Op Note (Signed)
OPERATIVE NOTE    PRE-OPERATIVE DIAGNOSIS: 1. ESRD   POST-OPERATIVE DIAGNOSIS: same as above  PROCEDURE: 1. Ultrasound guidance for vascular access to the right internal jugular vein 2. Fluoroscopic guidance for placement of catheter 3. Placement of a 19 cm tip to cuff tunneled hemodialysis catheter via the right internal jugular vein  SURGEON: Leotis Pain, MD  ANESTHESIA:  Local with Moderate conscious sedation for approximately 15 minutes using 4 mg of Versed and 100 mcg of Fentanyl  ESTIMATED BLOOD LOSS: 15 cc  FLUORO TIME: less than one minute  CONTRAST: none  FINDING(S): 1.  Patent right internal jugular vein  SPECIMEN(S):  None  INDICATIONS:   Carrie [name] is a 68 y.o.female who presents with renal failure and the need for a catheter to initiate dialysis.  The patient needs long term dialysis access for their ESRD, and a Permcath is necessary.  Risks and benefits are discussed and informed consent is obtained.    DESCRIPTION: After obtaining full informed written consent, the patient was brought back to the vascular suited. The patient's right neck and chest were sterilely prepped and draped in a sterile surgical field was created. Moderate conscious sedation was administered during a face to face encounter with the patient throughout the procedure with my supervision of the RN administering medicines and monitoring the patient's vital signs, pulse oximetry, telemetry and mental status throughout from the start of the procedure until the patient was taken to the recovery room.  The right internal jugular vein was visualized with ultrasound and found to be patent. It was then accessed under direct ultrasound guidance and a permanent image was recorded. A wire was placed. After skin nick and dilatation, the peel-away sheath was placed over the wire. I then turned my attention to an area under the clavicle. Approximately 1-2 fingerbreadths below the clavicle a small  counterincision was created and tunneled from the subclavicular incision to the access site. Using fluoroscopic guidance, a 19 centimeter tip to cuff tunneled hemodialysis catheter was selected, and tunneled from the subclavicular incision to the access site. It was then placed through the peel-away sheath and the peel-away sheath was removed. Using fluoroscopic guidance the catheter tips were parked in the right atrium. The appropriate distal connectors were placed. It withdrew blood well and flushed easily with heparinized saline and a concentrated heparin solution was then placed. It was secured to the chest wall with 2 Prolene sutures. The access incision was closed single 4-0 Monocryl. A 4-0 Monocryl pursestring suture was placed around the exit site. Sterile dressings were placed. The patient tolerated the procedure well and was taken to the recovery room in stable condition.  COMPLICATIONS: None  CONDITION: Stable  Leotis Pain, MD 08/04/2018 4:48 PM   This note was created with Dragon Medical transcription system. Any errors in dictation are purely unintentional.

## 2018-08-04 NOTE — Progress Notes (Signed)
Pre hemodialysis report received from R. Barth RN. Received patient in RDU via bed. Awake, alert and verbally responsive. No acute distress noted. Right CVC without signs and symptoms of infection.  Dressing in place and uncompromised. Accessed CVC per policy and found patent on each side. Hemodialysis treatment initiated at 1810 via CVC.  Plan for 2 hours treatment with no ultrafiltration.    08/04/18 1810  Hand-Off documentation  Report received from (Full Name) Fransico Michael, RN  Vital Signs  Temp 98.5 F (36.9 C)  Temp Source Oral  Pulse Rate 80  BP Location Right Arm  BP Method Automatic  Patient Position (if appropriate) Lying  Oxygen Therapy  SpO2 97 %  O2 Device Room Air  Pain Assessment  Pain Scale 0-10  Pain Score 0  Time-Out for Hemodialysis  What Procedure? hemodialysis  Pt Identifiers(min of two) First/Last Name;MRN/Account#;Pt's DOB(use if MRN/Acct# not available  Correct Site? Yes  Correct Side? Yes  Correct Procedure? Yes  Consents Verified? Yes  Rad Studies Available? N/A  Safety Precautions Reviewed? Yes  Engineer, civil (consulting) Number 5  Station Number 4  UF/Alarm Test Passed  Conductivity: Meter 14  Conductivity: Machine  13.8  pH 7.6  Reverse Osmosis central  Normal Saline Lot Number [id number]  Dialyzer Lot Number [id number]  Disposable Set Lot Number [id number]  Machine Temperature 98.6 F (37 C)  Musician and Audible Yes  Blood Lines Intact and Secured Yes  Pre Treatment Patient Checks  Vascular access used during treatment Catheter  Isolation Initiated Yes  Hemodialysis Consent Verified Yes  Hemodialysis Standing Orders Initiated Yes  ECG (Telemetry) Monitor On Yes  Prime Ordered Normal Saline  Length of  DialysisTreatment -hour(s) 2 Hour(s)  Dialysis Treatment Comments 1st treatment  Dialyzer Elisio 17H NR  Dialysate 3K, 2.5 Ca  Dialysate Flow Ordered 300  Blood Flow Rate Ordered 200 mL/min  Ultrafiltration Goal 0 Liters  During  Hemodialysis Assessment  Blood Flow Rate (mL/min) 200 mL/min  Arterial Pressure (mmHg) -70 mmHg  Venous Pressure (mmHg) 60 mmHg  Transmembrane Pressure (mmHg) 40 mmHg  Ultrafiltration Rate (mL/min) 250 mL/min  Dialysate Flow Rate (mL/min) 300 ml/min  Conductivity: Machine  13.8 (myron 14.0)  HD Safety Checks Performed Yes  Intra-Hemodialysis Comments Tx initiated

## 2018-08-04 NOTE — Progress Notes (Signed)
Dr. Lucky Cowboy at bedside, speaking with pt. And her sister re: perm cath placement/procedural results. Both verbalize understanding of conversation. Pt. With HOB 45 degrees. Pt. Eating sandwich now. Report called to floor RN.

## 2018-08-04 NOTE — Consult Note (Signed)
[facility] Consult Note  MRN : [id number]  Carrie [name] is a 68 y.o. (1951-03-03) female who presents with chief complaint of No chief complaint on file. [name]  History of Present Illness: I am asked to see the patient by Dr. Juleen China for permcath placement.  Patient has known longstanding chronic kidney disease and has now developed uremic symptoms requiring initiation of dialysis.  She has known stage V chronic kidney disease and had an outpatient work-up for access creation in the arm that has been planned for a later date.  The patient has developed volume overload with swelling and shortness of breath.  She is being directly admitted from the nephrologist office to initiate dialysis and needs a catheter  Current Facility-Administered Medications  Medication Dose Route Frequency Provider Last Rate Last Dose  . [MAR Hold] 0.9 %  sodium chloride infusion  250 mL Intravenous PRN Mody, Sital, MD      . 0.9 %  sodium chloride infusion   Intravenous Continuous Algernon Huxley, MD 20 mL/hr at 08/04/18 1615    . [MAR Hold] acetaminophen (TYLENOL) tablet 650 mg  650 mg Oral Q6H PRN Bettey Costa, MD       Or  . Doug Sou Hold] acetaminophen (TYLENOL) suppository 650 mg  650 mg Rectal Q6H PRN Bettey Costa, MD      . Doug Sou Hold] amLODipine (NORVASC) tablet 10 mg  10 mg Oral Daily Mody, Sital, MD      . Doug Sou Hold] atorvastatin (LIPITOR) tablet 40 mg  40 mg Oral q1800 Mody, Sital, MD      . Doug Sou Hold] calcitRIOL (ROCALTROL) capsule 0.25 mcg  0.25 mcg Oral Daily Mody, Sital, MD      . ceFAZolin (ANCEF) 2-4 GM/100ML-% IVPB           . [START ON 08/05/2018] Chlorhexidine Gluconate Cloth 2 % PADS 6 each  6 each Topical Q0600 Kolluru, Sarath, MD      . fentaNYL (SUBLIMAZE) 100 MCG/2ML injection           . fentaNYL (SUBLIMAZE) injection    PRN Algernon Huxley, MD   25 mcg at 08/04/18 1633  . heparin 10000 UNIT/ML injection           . [MAR Hold] heparin injection 5,000 Units  5,000 Units  Subcutaneous Q8H Mody, Sital, MD      . Doug Sou Hold] HYDROcodone-acetaminophen (NORCO/VICODIN) 5-325 MG per tablet 1-2 tablet  1-2 tablet Oral Q4H PRN Bettey Costa, MD      . Doug Sou Hold] levETIRAcetam (KEPPRA) tablet 1,000 mg  1,000 mg Oral BID Mody, Sital, MD      . lidocaine-EPINEPHrine (XYLOCAINE-EPINEPHrine) 1 %-1:200000 (PF) injection           . [MAR Hold] losartan (COZAAR) tablet 50 mg  50 mg Oral Daily Bettey Costa, MD      . Doug Sou Hold] metoprolol succinate (TOPROL-XL) 24 hr tablet 25 mg  25 mg Oral Daily Mody, Sital, MD      . midazolam (VERSED) 5 MG/5ML injection           . midazolam (VERSED) injection    PRN Algernon Huxley, MD   1 mg at 08/04/18 1634  . [MAR Hold] ondansetron (ZOFRAN) tablet 4 mg  4 mg Oral Q6H PRN Bettey Costa, MD       Or  . Doug Sou Hold] ondansetron (ZOFRAN) injection 4 mg  4 mg Intravenous Q6H PRN Bettey Costa, MD      . [  MAR Hold] polyethylene glycol (MIRALAX / GLYCOLAX) packet 17 g  17 g Oral Daily PRN Bettey Costa, MD      . Doug Sou Hold] sodium bicarbonate tablet 1,300 mg  1,300 mg Oral BID Bettey Costa, MD      . Doug Sou Hold] sodium chloride flush (NS) 0.9 % injection 3 mL  3 mL Intravenous Q12H Mody, Sital, MD      . Doug Sou Hold] sodium chloride flush (NS) 0.9 % injection 3 mL  3 mL Intravenous PRN Bettey Costa, MD      . Doug Sou Hold] torsemide (DEMADEX) tablet 10 mg  10 mg Oral Daily Bettey Costa, MD        Past Medical History:  Diagnosis Date  . Allergy   . Arthritis    "right knee" (10/26'/2017)  . CKD (chronic kidney disease), stage III ([place])    12/2015 (Dr. Lavonia Dana)  . Depression   . History of blood transfusion 2008   "when I had brain tumor surgery"  . History of MRSA infection 2008  . Hypertension   . 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 Type II diabetes mellitus ([place]) 2011   on metformin until yr ago- no med now - off due to kidneys    Past Surgical History:  Procedure Laterality Date  . BRAIN MENINGIOMA EXCISION  08/2006   Foramina magnum  meningioma  . CERVICAL DISCECTOMY  1996   Dr. Alric Seton  . CESAREAN SECTION  1981  . COLONOSCOPY    . DILATION AND CURETTAGE OF UTERUS    . JOINT REPLACEMENT    . Right wrist x-ray  2007   Deg. at 1st MCP  . TOTAL HIP ARTHROPLASTY Left 03/27/2016   Procedure: LEFT TOTAL HIP ARTHROPLASTY ANTERIOR APPROACH;  Surgeon: Mcarthur Rossetti, MD;  Location: [facility];  Service: Orthopedics;  Laterality: Left;  . TOTAL KNEE ARTHROPLASTY Right 04/30/2017   Procedure: RIGHT TOTAL KNEE ARTHROPLASTY;  Surgeon: Mcarthur Rossetti, MD;  Location: [facility];  Service: Orthopedics;  Laterality: Right;  . TUBAL LIGATION  1981    Social History Social History   Tobacco Use  . Smoking status: Never Smoker  . Smokeless tobacco: Never Used  Substance Use Topics  . Alcohol use: No    Alcohol/week: 0.0 standard drinks  . Drug use: No    Family History Family History  Problem Relation Age of Onset  . Hypertension Other        Strong family history   . Breast cancer Mother 61  . Heart failure Sister   . Heart disease Sister   No bleeding or clotting disorders  Allergies  Allergen Reactions  . Naproxen Sodium Anaphylaxis and Swelling  . Sulfamethoxazole-Trimethoprim Anaphylaxis and Swelling     REVIEW OF SYSTEMS (Negative unless checked)  Constitutional: [] Weight loss  [] Fever  [] Chills Cardiac: [] Chest pain   [] Chest pressure   [] Palpitations   [] Shortness of breath when laying flat   [x] Shortness of breath at rest   [x] Shortness of breath with exertion. Vascular:  [] Pain in legs with walking   [] Pain in legs at rest   [] Pain in legs when laying flat   [] Claudication   [] Pain in feet when walking  [] Pain in feet at rest  [] Pain in feet when laying flat   [] History of DVT   [] Phlebitis   [x] Swelling in legs   [] Varicose veins   [] Non-healing ulcers Pulmonary:   [] Uses home oxygen   [] Productive cough   [] Hemoptysis   []   Wheeze  [] COPD   [] Asthma Neurologic:  [] Dizziness  [] Blackouts    [] Seizures   [] History of stroke   [] History of TIA  [] Aphasia   [] Temporary blindness   [] Dysphagia   [] Weakness or numbness in arms   [] Weakness or numbness in legs Musculoskeletal:  [x] Arthritis   [] Joint swelling   [] Joint pain   [] Low back pain Hematologic:  [] Easy bruising  [] Easy bleeding   [] Hypercoagulable state   [] Anemic  [] Hepatitis Gastrointestinal:  [] Blood in stool   [] Vomiting blood  [] Gastroesophageal reflux/heartburn   [] Difficulty swallowing. Genitourinary:  [x] Chronic kidney disease   [] Difficult urination  [] Frequent urination  [] Burning with urination   [] Blood in urine Skin:  [] Rashes   [] Ulcers   [] Wounds Psychological:  [] History of anxiety   [x]  History of major depression.  Physical Examination  Vitals:   08/04/18 1625 08/04/18 1630 08/04/18 1635 08/04/18 1640  BP: (!) 153/85 (!) 150/86 (!) 151/93 (!) 147/83  Pulse: 88 87 87 87  Resp: (!) 22 (!) 21 20 14   Temp:      TempSrc:      SpO2: 99% 96% 96% 94%  Weight:      Height:       Body mass index is 44.99 kg/m. Gen:  WD/WN, NAD Head: [place]/AT, No temporalis wasting.  Ear/Nose/Throat: Hearing grossly intact, nares w/o erythema or drainage, oropharynx w/o Erythema/Exudate Eyes: Sclera non-icteric, conjunctiva clear Neck: Trachea midline.  No JVD.  Pulmonary:  Good air movement, respirations not labored, equal bilaterally.  Cardiac: RRR, normal S1, S2. Vascular:  Vessel Right Left  Radial Palpable Palpable                                   Gastrointestinal: soft, non-tender/non-distended. No guarding/reflex.  Musculoskeletal: M/S 5/5 throughout.  Extremities without ischemic changes.  No deformity or atrophy.  Mild to moderate bilateral lower extremity edema Neurologic: Sensation grossly intact in extremities.  Symmetrical.  Speech is fluent. Motor exam as listed above. Psychiatric: Judgment intact, Mood & affect appropriate for pt's clinical situation. Dermatologic: No rashes or ulcers noted.  No  cellulitis or open wounds.       CBC Lab Results  Component Value Date   WBC 6.6 12/20/2017   HGB 10.2 (L) 12/20/2017   HCT 31.6 (L) 12/20/2017   MCV 80.9 12/20/2017   PLT 366.0 12/20/2017    BMET    Component Value Date/Time   NA 141 12/20/2017 1145   K 4.0 12/20/2017 1145   CL 106 12/20/2017 1145   CO2 26 12/20/2017 1145   GLUCOSE 118 (H) 12/20/2017 1145   BUN 51 (H) 12/20/2017 1145   BUN 36 (A) 07/08/2017   CREATININE 3.62 (H) 12/20/2017 1145   CALCIUM 9.2 12/20/2017 1145   GFRNONAA 21 (L) 05/02/2017 0701   GFRAA 25 (L) 05/02/2017 0701   CrCl cannot be calculated (Patient's most recent lab result is older than the maximum 21 days allowed.).  COAG Lab Results  Component Value Date   INR 1.61 05/03/2017   INR 1.37 05/02/2017   INR 1.12 05/01/2017    Radiology Nm Myocar Multi W/spect W/wall Motion / Ef  Result Date: 08/01/2018 Pharmacological myocardial perfusion imaging study with no significant  ischemia Normal wall motion, EF estimated at 66% No EKG changes concerning for ischemia at peak stress or in recovery. Low risk scan Signed, Esmond Plants, MD, Ph.D [facility]   Vas [place] Upper  Extremity Arterial Duplex  Result Date: 07/21/2018 UPPER EXTREMITY DUPLEX STUDY Indications: Esrd. History:     For dialysis access.  Risk Factors: Diabetes. Performing Technologist: Concha Norway RVT  Examination Guidelines: A complete evaluation includes B-mode imaging, spectral Doppler, color Doppler, and power Doppler as needed of all accessible portions of each vessel. Bilateral testing is considered an integral part of a complete examination. Limited examinations for reoccurring indications may be performed as noted.  Right Pre-Dialysis Findings: +-----------------------+----------+--------------------+---------+--------+ Location               PSV (cm/s)Intralum. Diam. (cm)Waveform Comments +-----------------------+----------+--------------------+---------+--------+  Brachial Antecub. fossa102       0.56                triphasic         +-----------------------+----------+--------------------+---------+--------+ Radial Art at Wrist    85        0.25                triphasic         +-----------------------+----------+--------------------+---------+--------+ Ulnar Art at Wrist     86        0.17                triphasic         +-----------------------+----------+--------------------+---------+--------+ Left Pre-Dialysis Findings: +-----------------------+----------+--------------------+---------+--------+ Location               PSV (cm/s)Intralum. Diam. (cm)Waveform Comments +-----------------------+----------+--------------------+---------+--------+ Brachial Antecub. fossa76        0.53                triphasic         +-----------------------+----------+--------------------+---------+--------+ Radial Art at Wrist    62        0.26                triphasic         +-----------------------+----------+--------------------+---------+--------+ Ulnar Art at Wrist     54        0.21                biphasic          +-----------------------+----------+--------------------+---------+--------+  Summary:  Right: No obstruction visualized in the right upper extremity. Left: No obstruction visualized in the left upper extremity. *See table(s) above for measurements and observations. Electronically signed by [name] MD on 07/21/2018 at 4:39:06 PM.    Final    Vas [place] Upper Extremity Vein Mapping  Result Date: 07/21/2018 UPPER EXTREMITY VEIN MAPPING  Indications: Pre-access. History: Esrd.  Performing Technologist: Concha Norway RVT  Examination Guidelines: A complete evaluation includes B-mode imaging, spectral Doppler, color Doppler, and power Doppler as needed of all accessible portions of each vessel. Bilateral testing is considered an integral part of a complete examination. Limited examinations for reoccurring indications may be  performed as noted. +-----------------+-------------+----------+--------+ Right Cephalic   Diameter (cm)Depth (cm)Findings +-----------------+-------------+----------+--------+ Shoulder             0.27                        +-----------------+-------------+----------+--------+ Prox upper arm       0.24                        +-----------------+-------------+----------+--------+ Mid upper arm        0.21                        +-----------------+-------------+----------+--------+  Dist upper arm       0.18                        +-----------------+-------------+----------+--------+ Antecubital fossa    0.18                        +-----------------+-------------+----------+--------+ Prox forearm         0.18                        +-----------------+-------------+----------+--------+ Mid forearm          0.19                        +-----------------+-------------+----------+--------+ Dist forearm         0.17                        +-----------------+-------------+----------+--------+ +-----------------+-------------+----------+--------+ Right Basilic    Diameter (cm)Depth (cm)Findings +-----------------+-------------+----------+--------+ Prox upper arm       0.46                        +-----------------+-------------+----------+--------+ Mid upper arm        0.33                        +-----------------+-------------+----------+--------+ Dist upper arm       0.32                        +-----------------+-------------+----------+--------+ Antecubital fossa    0.29                        +-----------------+-------------+----------+--------+ Prox forearm         0.26                        +-----------------+-------------+----------+--------+  +-----------------+-------------+----------+--------+ Left Cephalic    Diameter (cm)Depth (cm)Findings +-----------------+-------------+----------+--------+ Shoulder             0.19                         +-----------------+-------------+----------+--------+ Prox upper arm       0.19                        +-----------------+-------------+----------+--------+ Mid upper arm        0.19                        +-----------------+-------------+----------+--------+ Dist upper arm       0.16                        +-----------------+-------------+----------+--------+ Antecubital fossa    0.16                        +-----------------+-------------+----------+--------+ Prox forearm         0.21                        +-----------------+-------------+----------+--------+ Mid forearm          0.20                        +-----------------+-------------+----------+--------+  Dist forearm         0.15                        +-----------------+-------------+----------+--------+ +-----------------+-------------+----------+--------+ Left Basilic     Diameter (cm)Depth (cm)Findings +-----------------+-------------+----------+--------+ Prox upper arm       0.36                        +-----------------+-------------+----------+--------+ Mid upper arm        0.33                        +-----------------+-------------+----------+--------+ Dist upper arm       0.32                        +-----------------+-------------+----------+--------+ Antecubital fossa    0.22                        +-----------------+-------------+----------+--------+ Prox forearm         0.21                        +-----------------+-------------+----------+--------+ Patient does not appear to be candidate for endovascular fistula creation.  Summary: Right: Compressible veins. Left: Compressible veins. *See table(s) above for measurements and observations.  Diagnosing physician: [name] MD Electronically signed by [name] MD on 07/21/2018 at 4:39:03 PM.    Final       Assessment/Plan 1.  End-stage renal disease.  Now needs urgent dialysis and a catheter will be  placed today.  She is already had an outpatient work-up for access creation in the arm, and this can still be done at the scheduled time.  Following PermCath placement today, dialysis can be initiated through the catheter 2.  Hypertension.  An underlying cause of her renal failure and blood pressure control important in reducing the progression of atherosclerotic disease. On appropriate oral medications. 3.  Diabetes.  An underlying cause of her renal failure and blood glucose control important in reducing the progression of atherosclerotic disease. Also, involved in wound healing. On appropriate medications.    Leotis Pain, MD  08/04/2018 4:41 PM    This note was created with Dragon medical transcription system.  Any error is purely unintentional

## 2018-08-04 NOTE — H&P (Signed)
[facility] [place] NAME: Carrie [name]    MR#:  [id number]  DATE OF BIRTH:  March 25, 1951  DATE OF ADMISSION:  08/04/2018  PRIMARY CARE PHYSICIAN: Venia Carbon, MD   REQUESTING/REFERRING PHYSICIAN: dr Sim Boast  CHIEF COMPLAINT:   Direct admit to initiate dialysis HISTORY OF PRESENT ILLNESS:  Carrie [name]  is a 68 y.o. female with a known history of chronic diastolic heart failure, meningioma status post surgical intervention 2008, end-stage renal disease not currently on dialysis, diet-controlled diabetes and hypertension who presents as a direct admit to initiate dialysis.  Patient reports that she has had increasing shortness of breath and lower extremity edema. She had cardiac evaluation most recently at the end of February with a stress test which essentially was unremarkable. Echocardiogram from May 2019 shows normal ejection fraction with grade 1 diastolic dysfunction  Patient has progressed to stage V kidney disease and is here to initiate dialysis.  PAST MEDICAL HISTORY:   Past Medical History:  Diagnosis Date  . Allergy   . Arthritis    "right knee" (10/26'/2017)  . CKD (chronic kidney disease), stage III ([place])    12/2015 (Dr. Lavonia Dana)  . Depression   . History of blood transfusion 2008   "when I had brain tumor surgery"  . History of MRSA infection 2008  . Hypertension   . 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 Type II diabetes mellitus ([place]) 2011   on metformin until yr ago- no med now - off due to kidneys    PAST SURGICAL HISTORY:   Past Surgical History:  Procedure Laterality Date  . BRAIN MENINGIOMA EXCISION  08/2006   Foramina magnum meningioma  . CERVICAL DISCECTOMY  1996   Dr. Alric Seton  . CESAREAN SECTION  1981  . COLONOSCOPY    . DILATION AND CURETTAGE OF UTERUS    . JOINT REPLACEMENT    . Right wrist x-ray  2007   Deg. at 1st MCP  . TOTAL HIP ARTHROPLASTY Left 03/27/2016   Procedure: LEFT  TOTAL HIP ARTHROPLASTY ANTERIOR APPROACH;  Surgeon: Mcarthur Rossetti, MD;  Location: [facility];  Service: Orthopedics;  Laterality: Left;  . TOTAL KNEE ARTHROPLASTY Right 04/30/2017   Procedure: RIGHT TOTAL KNEE ARTHROPLASTY;  Surgeon: Mcarthur Rossetti, MD;  Location: [facility];  Service: Orthopedics;  Laterality: Right;  . TUBAL LIGATION  1981    SOCIAL HISTORY:   Social History   Tobacco Use  . Smoking status: Never Smoker  . Smokeless tobacco: Never Used  Substance Use Topics  . Alcohol use: No    Alcohol/week: 0.0 standard drinks    FAMILY HISTORY:   Family History  Problem Relation Age of Onset  . Hypertension Other        Strong family history   . Breast cancer Mother 58  . Heart failure Sister   . Heart disease Sister     DRUG ALLERGIES:   Allergies  Allergen Reactions  . Naproxen Sodium Anaphylaxis and Swelling  . Sulfamethoxazole-Trimethoprim Anaphylaxis and Swelling    REVIEW OF SYSTEMS:   Review of Systems  Constitutional: Negative.  Negative for chills, fever and malaise/fatigue.  HENT: Negative.  Negative for ear discharge, ear pain, hearing loss, nosebleeds and sore throat.   Eyes: Negative.  Negative for blurred vision and pain.  Respiratory: Positive for shortness of breath. Negative for cough, hemoptysis and wheezing.   Cardiovascular: Positive for leg swelling. Negative for chest pain and  palpitations.  Gastrointestinal: Negative.  Negative for abdominal pain, blood in stool, diarrhea, nausea and vomiting.  Genitourinary: Negative.  Negative for dysuria.  Musculoskeletal: Negative.  Negative for back pain.  Skin: Negative.   Neurological: Negative for dizziness, tremors, speech change, focal weakness, seizures and headaches.  Endo/Heme/Allergies: Negative.  Does not bruise/bleed easily.  Psychiatric/Behavioral: Negative.  Negative for depression, hallucinations and suicidal ideas.    MEDICATIONS AT HOME:   Prior to Admission medications    Medication Sig Start Date End Date Taking? Authorizing Provider  amLODipine (NORVASC) 10 MG tablet TAKE 1 TABLET BY MOUTH EVERY DAY 06/30/18   Viviana Simpler I, MD  atorvastatin (LIPITOR) 40 MG tablet TAKE 40 MG BY MOUTH ONCE DAILY 03/11/18   Viviana Simpler I, MD  calcitRIOL (ROCALTROL) 0.25 MCG capsule Take 0.25 mcg by mouth daily.  06/30/18   [provider]  levETIRAcetam (KEPPRA) 1000 MG tablet TAKE ONE TABLET BY MOUTH TWICE DAILY 05/09/18   Viviana Simpler I, MD  LORazepam (ATIVAN) 0.5 MG tablet TAKE 1/2 TO 1 TABLET BY MOUTH 3 TIMES A DAY AS NEEDED 06/23/18   Venia Carbon, MD  losartan (COZAAR) 50 MG tablet Take 50 mg by mouth daily. 04/30/17   [provider]  metoprolol succinate (TOPROL-XL) 25 MG 24 hr tablet TAKE 1 TABLET BY MOUTH EVERY DAY 03/12/18   Gollan, Kathlene November, MD  NON FORMULARY Doterra 1 tablet daily.    [provider]  sodium bicarbonate 650 MG tablet Take 1,300 mg by mouth 2 (two) times daily.  06/24/18   [provider]  torsemide (DEMADEX) 10 MG tablet Take 10 mg by mouth daily.    [provider]      VITAL SIGNS:  Blood pressure (!) 150/84, pulse 86, temperature 98.4 F (36.9 C), temperature source Oral, resp. rate 16, SpO2 98 %.  PHYSICAL EXAMINATION:   Physical Exam Musculoskeletal:     Right lower leg: Edema present.     Left lower leg: Edema present.       LABORATORY PANEL:   CBC No results for input(s): WBC, HGB, HCT, PLT in the last 168 hours. ------------------------------------------------------------------------------------------------------------------  Chemistries  No results for input(s): NA, K, CL, CO2, GLUCOSE, BUN, CREATININE, CALCIUM, MG, AST, ALT, ALKPHOS, BILITOT in the last 168 hours.  Invalid input(s): GFRCGP ------------------------------------------------------------------------------------------------------------------  Cardiac Enzymes No results for input(s): TROPONINI in the  last 168 hours. ------------------------------------------------------------------------------------------------------------------  RADIOLOGY:  No results found.  EKG:   Orders placed or performed in visit on 07/28/18  . EKG 12-Lead    IMPRESSION AND PLAN:   68 year old female with chronic diastolic heart failure, hypertension and chronic kidney disease stage V which is now progressed to end-stage renal disease requiring dialysis who is directly admitted to initiate dialysis.  1.  End-stage renal disease requiring dialysis: Temporary catheter to be placed today and dialysis to be initiated Patient will need outpatient placement for dialysis when she has 3 dialysis in the hospital.  2.  Chronic diastolic heart failure: Patient has some lower extremity edema but this is due to her end-stage renal disease Patient followed by Dr. Rockey Situ Continue torsemide for now 3.  Essential hypertension: Continue Norvasc, losartan, metoprolol   4.  Hyperlipidemia: Continue atorvastatin    Management plans discussed with the patient and she is in agreement  CODE STATUS: FULL  TOTAL TIME TAKING CARE OF THIS PATIENT: 44 minutes.    Emma Schupp M.D on 08/04/2018 at 3:46 PM  Between 7am to 6pm -  Pager - [phone]  After 6pm go to [email or web address] - password [email or web address] [facility]  Office  [phone]  CC: Primary care physician; Venia Carbon, MD

## 2018-08-04 NOTE — Progress Notes (Signed)
Family Meeting Note  Advance Directive:no  Today a meeting took place with the Patient.and sister  The following clinical team members were present during this meeting:MD RN  The following were discussed:Patient's diagnosis: esrd starting HD , Patient's progosis: > 12 months and Goals for treatment: Full Code  Additional follow-up to be provided: chaplain consult to create AD  Time spent during discussion:16 minutes  Belissa Kooy, Ulice Bold, MD

## 2018-08-04 NOTE — Progress Notes (Signed)
Hemodialysis treatment completed at 2012. Net UF removed 0. Blood rinsed back, CVC locked with NSA and and red caps replaced. Dressing remains intact and insertion site remains without signs and symptoms of infection.  Post hemodialysis report given to K. Zielke, RN.   08/04/18 2012  Hand-Off documentation  Report given to (Full Name) Jacqulynn Cadet, RN  Vital Signs  Temp 98.2 F (36.8 C)  Temp Source Oral  Resp 11  BP (!) 152/77  BP Location Right Arm  BP Method Automatic  Patient Position (if appropriate) Lying  During Hemodialysis Assessment  Blood Flow Rate (mL/min) 200 mL/min  Arterial Pressure (mmHg) -70 mmHg  Venous Pressure (mmHg) 60 mmHg  Transmembrane Pressure (mmHg) 40 mmHg  Ultrafiltration Rate (mL/min) 250 mL/min  Dialysate Flow Rate (mL/min) 300 ml/min  Conductivity: Machine  13.8  HD Safety Checks Performed Yes  Post-Hemodialysis Assessment  Rinseback Volume (mL) 250 mL  Dialyzer Clearance Lightly streaked  Duration of HD Treatment -hour(s) 2 hour(s)  Hemodialysis Intake (mL) 500 mL  UF Total -Machine (mL) 500 mL  Net UF (mL) 0 mL  Tolerated HD Treatment Yes  Education / Care Plan  Dialysis Education Provided Yes  Documented Education in Care Plan Yes  Outpatient Plan of Care Reviewed and on Chart Yes

## 2018-08-05 ENCOUNTER — Encounter: Payer: Self-pay | Admitting: Vascular Surgery

## 2018-08-05 LAB — CBC
HCT: 25.5 % — ABNORMAL LOW (ref 36.0–46.0)
Hemoglobin: 7.9 g/dL — ABNORMAL LOW (ref 12.0–15.0)
MCH: 27.1 pg (ref 26.0–34.0)
MCHC: 31 g/dL (ref 30.0–36.0)
MCV: 87.6 fL (ref 80.0–100.0)
PLATELETS: 225 10*3/uL (ref 150–400)
RBC: 2.91 MIL/uL — ABNORMAL LOW (ref 3.87–5.11)
RDW: 15.4 % (ref 11.5–15.5)
WBC: 6.7 10*3/uL (ref 4.0–10.5)
nRBC: 0 % (ref 0.0–0.2)

## 2018-08-05 LAB — BASIC METABOLIC PANEL
Anion gap: 11 (ref 5–15)
BUN: 36 mg/dL — ABNORMAL HIGH (ref 8–23)
CO2: 27 mmol/L (ref 22–32)
Calcium: 8.1 mg/dL — ABNORMAL LOW (ref 8.9–10.3)
Chloride: 103 mmol/L (ref 98–111)
Creatinine, Ser: 3.64 mg/dL — ABNORMAL HIGH (ref 0.44–1.00)
GFR calc Af Amer: 14 mL/min — ABNORMAL LOW (ref >60)
GFR calc non Af Amer: 12 mL/min — ABNORMAL LOW (ref >60)
Glucose, Bld: 108 mg/dL — ABNORMAL HIGH (ref 70–99)
Potassium: 4.3 mmol/L (ref 3.5–5.1)
Sodium: 141 mmol/L (ref 135–145)

## 2018-08-05 LAB — HIV ANTIBODY (ROUTINE TESTING W REFLEX): HIV Screen 4th Generation wRfx: NONREACTIVE

## 2018-08-05 MED ORDER — NEPRO/CARBSTEADY PO LIQD
237.0000 mL | Freq: Two times a day (BID) | ORAL | Status: DC
  Administered 2018-08-05 – 2018-08-06 (×2): 237 mL via ORAL

## 2018-08-05 MED ORDER — RENA-VITE PO TABS
1.0000 | ORAL_TABLET | Freq: Every day | ORAL | Status: DC
  Administered 2018-08-05 – 2018-08-06 (×2): 1 via ORAL
  Filled 2018-08-05 (×2): qty 1

## 2018-08-05 NOTE — Progress Notes (Signed)
HD Tx Start   08/05/18 1445  Vital Signs  Pulse Rate 88  Resp 19  BP (!) 165/88  Oxygen Therapy  SpO2 94 %  During Hemodialysis Assessment  Blood Flow Rate (mL/min) 250 mL/min  Arterial Pressure (mmHg) -70 mmHg  Venous Pressure (mmHg) 80 mmHg  Transmembrane Pressure (mmHg) 40 mmHg  Ultrafiltration Rate (mL/min) 400 mL/min  Dialysate Flow Rate (mL/min) 500 ml/min  Conductivity: Machine  14  HD Safety Checks Performed Yes  Dialysis Fluid Bolus Normal Saline  Bolus Amount (mL) 250 mL  Intra-Hemodialysis Comments Progressing as prescribed

## 2018-08-05 NOTE — Progress Notes (Addendum)
[facility] [place] NAME: Carrie [name]    MR#:  [id number]  DATE OF BIRTH:  1950/09/26  SUBJECTIVE:   Patient states she feels well this morning, besides feeling tired and weak.  She had her first session of dialysis yesterday through her tunneled dialysis catheter, and tolerated this well.  No chest pain or shortness of breath.  No lower extremity edema.  REVIEW OF SYSTEMS:  Review of Systems  Constitutional: Positive for malaise/fatigue. Negative for chills and fever.  HENT: Negative for congestion and sore throat.   Eyes: Negative for blurred vision and double vision.  Respiratory: Negative for cough and shortness of breath.   Cardiovascular: Negative for chest pain, palpitations and leg swelling.  Gastrointestinal: Negative for nausea and vomiting.  Genitourinary: Negative for dysuria and urgency.  Musculoskeletal: Negative for back pain and neck pain.  Neurological: Positive for weakness. Negative for dizziness and headaches.  Psychiatric/Behavioral: Negative for depression and suicidal ideas.    DRUG ALLERGIES:   Allergies  Allergen Reactions  . Naproxen Sodium Anaphylaxis and Swelling  . Sulfamethoxazole-Trimethoprim Anaphylaxis and Swelling   VITALS:  Blood pressure 134/89, pulse 80, temperature 98.1 F (36.7 C), temperature source Oral, resp. rate 18, height 5\' 3"  (1.6 m), weight 115.2 kg, SpO2 95 %. PHYSICAL EXAMINATION:  Physical Exam  GENERAL:  Laying in the bed with no acute distress.  Well-appearing.  Pleasant and conversational. HEENT: Head atraumatic, normocephalic. Pupils equal, round, reactive to light and accommodation. No scleral icterus. Extraocular muscles intact. Oropharynx and nasopharynx clear.  NECK:  Supple, no jugular venous distention. No thyroid enlargement. LUNGS: Lungs are clear to auscultation bilaterally. No wheezes, crackles, rhonchi. No use of accessory muscles of respiration.  CARDIOVASCULAR:  RRR, S1, S2 normal. No murmurs, rubs, or gallops. + Tunneled dialysis catheter in right chest, without surrounding erythema. ABDOMEN: Soft, nontender, nondistended. Bowel sounds present.  EXTREMITIES: No pedal edema, cyanosis, or clubbing.  NEUROLOGIC: CN 2-12 intact, no focal deficits. 5/5 muscle strength throughout all extremities. Sensation intact throughout. Gait not checked.  PSYCHIATRIC: The patient is alert and oriented x 3.  SKIN: No obvious rash, lesion, or ulcer.  LABORATORY PANEL:  Female CBC Recent Labs  Lab 08/05/18 0523  WBC 6.7  HGB 7.9*  HCT 25.5*  PLT 225   ------------------------------------------------------------------------------------------------------------------ Chemistries  Recent Labs  Lab 08/05/18 0523  NA 141  K 4.3  CL 103  CO2 27  GLUCOSE 108*  BUN 36*  CREATININE 3.64*  CALCIUM 8.1*   RADIOLOGY:  No results found. ASSESSMENT AND PLAN:   New ESRD requiring dialysis-patient tolerated first dialysis treatment yesterday without any issues - Tunneled dialysis catheter placed by vascular 3/2 - Plan for daily dialysis sessions through Thursday 3/5 -Nephrology following  Chronic diastolic heart failure- stable, no signs of acute exacerbation -Fluid management per nephro -Continue torsemide  Essential hypertension- BPs well-controlled -Continue Norvasc, losartan, metoprolol  Hyperlipidemia: Continue lipitor  Anemia of chronic kidney disease- Hgb 7.9 -Monitor  All the records are reviewed and case discussed with Care Management/Social Worker. Management plans discussed with the patient, family and they are in agreement.  CODE STATUS: Full Code  TOTAL TIME TAKING CARE OF THIS PATIENT: 35 minutes.   More than 50% of the time was spent in counseling/coordination of care: YES  POSSIBLE D/C IN 2 DAYS, DEPENDING ON CLINICAL CONDITION.   Berna Spare Mayo M.D on 08/05/2018 at 12:30 PM  Between 7am to 6pm - Pager - [phone]  After 6pm go  to [email or web address] - Proofreader  [facility]  Office  [phone]  CC: Primary care physician; Venia Carbon, MD  Note: This dictation was prepared with Dragon dictation along with smaller phrase technology. Any transcriptional errors that result from this process are unintentional.

## 2018-08-05 NOTE — Progress Notes (Signed)
Pre HD Tx Assessment   08/05/18 1430  Neurological  Level of Consciousness Alert  Orientation Level Oriented X4  Respiratory  Respiratory Pattern Regular;Unlabored  Chest Assessment Chest expansion symmetrical  Bilateral Breath Sounds Diminished;Clear  Cardiac  Pulse Regular  ECG Monitor Yes  Vascular  R Radial Pulse +2  L Radial Pulse +2  Integumentary  Integumentary (WDL) X  Skin Color Appropriate for ethnicity  Skin Condition Dry  Musculoskeletal  Musculoskeletal (WDL) WDL  GU Assessment  Genitourinary (WDL) WDL  Psychosocial  Psychosocial (WDL) WDL

## 2018-08-05 NOTE — Progress Notes (Signed)
Post HD Tx   08/05/18 1730  Vital Signs  Pulse Rate 84  Resp 12  BP (!) 173/90  Oxygen Therapy  SpO2 99 %  Dialysis Weight  Weight 114.7 kg  Type of Weight Post-Dialysis  Post-Hemodialysis Assessment  Rinseback Volume (mL) 250 mL  KECN 40.7 V  Dialyzer Clearance Clear  Duration of HD Treatment -hour(s) 2.5 hour(s)  Hemodialysis Intake (mL) 500 mL  UF Total -Machine (mL) 1000 mL  Net UF (mL) 500 mL  Tolerated HD Treatment Yes  Post-Hemodialysis Comments CVC heparin lock,capped/clamped.

## 2018-08-05 NOTE — Progress Notes (Signed)
   08/05/18 1300  Clinical Encounter Type  Visited With Patient and family together  Visit Type Initial  Referral From [facility] received an OR to complete or update an AD. Upon arrival to the patient's room, the patient was sitting in bed, awake and alert. Her sister and niece were at the bedside. The patient confirmed interest in hearing about the document; education provided. Her sister inquired about the portability of the document and was relieved to learn that it applies to her sister's care within the state of [place], not just within this hospital.

## 2018-08-05 NOTE — Progress Notes (Signed)
HD Tx End   08/05/18 1715  Hand-Off documentation  Report given to (Full Name) [facility] RN   Report received from (Full Name) Trellis Paganini RN  Vital Signs  Temp 99.2 F (37.3 C)  Temp Source Oral  Pulse Rate 82  Pulse Rate Source Monitor  Resp 12  BP (!) 160/94  BP Location Right Arm  BP Method Automatic  Patient Position (if appropriate) Lying  Oxygen Therapy  SpO2 98 %  O2 Device Room Air  Pain Assessment  Pain Scale 0-10  Pain Score 5  Pain Type Surgical pain  Pain Location  (catheter exit site pain from surgery yesterday)  During Hemodialysis Assessment  Blood Flow Rate (mL/min) 200 mL/min  Arterial Pressure (mmHg) -90 mmHg  Venous Pressure (mmHg) 60 mmHg  Transmembrane Pressure (mmHg) 40 mmHg  Ultrafiltration Rate (mL/min) 400 mL/min  Dialysate Flow Rate (mL/min) 500 ml/min  Conductivity: Machine  14  HD Safety Checks Performed Yes  KECN 40.7 KECN  Dialysis Fluid Bolus Normal Saline  Bolus Amount (mL) 250 mL  Intra-Hemodialysis Comments Tx completed;Tolerated well

## 2018-08-05 NOTE — Progress Notes (Signed)
[facility]  ROUNDING NOTE   Subjective:   First hemodialysis treatment yesterday. Tolerated treatment well.   Sister at bedside.    Objective:  Vital signs in last 24 hours:  Temp:  [98 F (36.7 C)-98.5 F (36.9 C)] 98.1 F (36.7 C) (03/03 1134) Pulse Rate:  [76-89] 80 (03/03 1134) Resp:  [11-25] 18 (03/03 0542) BP: (133-158)/(65-93) 134/89 (03/03 1134) SpO2:  [94 %-99 %] 95 % (03/03 1134) Weight:  [115.2 kg] 115.2 kg (03/02 1609)  Weight change:  Filed Weights   08/04/18 1609  Weight: 115.2 kg    Intake/Output: I/O last 3 completed shifts: In: 120 [P.O.:120] Out: 0    Intake/Output this shift:  No intake/output data recorded.  Physical Exam: General: NAD,   Head: Normocephalic, atraumatic. Moist oral mucosal membranes  Eyes: Anicteric, PERRL  Neck: Supple, trachea midline  Lungs:  Clear to auscultation  Heart: Regular rate and rhythm  Abdomen:  Soft, nontender,   Extremities: No bilateral  peripheral edema.  Neurologic: Nonfocal, moving all four extremities  Skin: No lesions  Access: RIJ permcath 3/2 Dr. Lucky Cowboy    Basic Metabolic Panel: Recent Labs  Lab 08/04/18 2047 08/05/18 0523  NA 142 141  K 3.8 4.3  CL 105 103  CO2 29 27  GLUCOSE 94 108*  BUN 30* 36*  CREATININE 2.95* 3.64*  CALCIUM 7.8* 8.1*  PHOS 3.1  --     Liver Function Tests: Recent Labs  Lab 08/04/18 2047  ALBUMIN 3.6   No results for input(s): LIPASE, AMYLASE in the last 168 hours. No results for input(s): AMMONIA in the last 168 hours.  CBC: Recent Labs  Lab 08/05/18 0523  WBC 6.7  HGB 7.9*  HCT 25.5*  MCV 87.6  PLT 225    Cardiac Enzymes: No results for input(s): CKTOTAL, CKMB, CKMBINDEX, TROPONINI in the last 168 hours.  BNP: Invalid input(s): POCBNP  CBG: No results for input(s): GLUCAP in the last 168 hours.  Microbiology: Results for orders placed or performed during the hospital encounter of 04/30/17  MRSA PCR Screening     Status: None   Collection Time: 04/30/17  8:09 PM  Result Value Ref Range Status   MRSA by PCR NEGATIVE NEGATIVE Final    Comment:        The GeneXpert MRSA Assay (FDA approved for NASAL specimens only), is one component of a comprehensive MRSA colonization surveillance program. It is not intended to diagnose MRSA infection nor to guide or monitor treatment for MRSA infections.     Coagulation Studies: No results for input(s): LABPROT, INR in the last 72 hours.  Urinalysis: No results for input(s): COLORURINE, LABSPEC, PHURINE, GLUCOSEU, HGBUR, BILIRUBINUR, KETONESUR, PROTEINUR, UROBILINOGEN, NITRITE, LEUKOCYTESUR in the last 72 hours.  Invalid input(s): APPERANCEUR    Imaging: No results found.   Medications:   . sodium chloride     . amLODipine  10 mg Oral Daily  . atorvastatin  40 mg Oral q1800  . calcitRIOL  0.25 mcg Oral Daily  . Chlorhexidine Gluconate Cloth  6 each Topical Q0600  . heparin  5,000 Units Subcutaneous Q8H  . levETIRAcetam  1,000 mg Oral BID  . losartan  50 mg Oral Daily  . metoprolol succinate  25 mg Oral Daily  . sodium bicarbonate  1,300 mg Oral BID  . sodium chloride flush  3 mL Intravenous Q12H  . torsemide  10 mg Oral Daily   sodium chloride, acetaminophen **OR** acetaminophen, HYDROcodone-acetaminophen, ondansetron **OR** ondansetron (ZOFRAN) IV,  polyethylene glycol, sodium chloride flush  Assessment/ Plan:  Carrie [name] is a 68 y.o. black female with chronic kidney disease stage V, hypertension, migraine headache disorder, allergic rhinitis, diabetes mellitus type II, history of meningioma, osteoarthritis, anxiety, hyperlipidemia, diastolic congestive heart failure  1. End Stage Renal Disease: first hemodialysis 3/2.  - Pending quantiferon gold and viral hepatitis panel.  - Second treatment for later today.   2. Hypertension with edema:  Home regimen of losartan, torsemide, and amlodipine.   3. Diabetes Mellitus type II with chronic kidney  disease: Currently diet controlled.   4. Secondary Hyperparathyroidism with Vitamin D deficiency: Currently on calcitriol 0.25 mcg    5. Anemia with chronic kidney disease: hemoglobin 7.9 - EPO with HD treatment    LOS: 1 Carrie [name] 3/3/20201:00 PM

## 2018-08-05 NOTE — Care Management (Signed)
Notified that patient will need new outpatient HD setup.  Per Carrie [name] HD liaison outpatient schedule will be [facility]

## 2018-08-05 NOTE — Progress Notes (Signed)
Pre HD Tx   08/05/18 1420  Hand-Off documentation  Report given to (Full Name) Trellis Paganini RN  Report received from (Full Name) Lum Babe RN  Vital Signs  Temp 99.4 F (37.4 C)  Temp Source Oral  Pulse Rate 82  Pulse Rate Source Monitor  Resp 15  BP (!) 153/86  BP Location Right Arm  BP Method Automatic  Patient Position (if appropriate) Lying  Oxygen Therapy  SpO2 95 %  Pain Assessment  Pain Scale 0-10  Pain Score 2  Pain Type Surgical pain  Pain Location Chest  Pain Orientation Right;Upper  Dialysis Weight  Weight 115.2 kg  Type of Weight Pre-Dialysis  Time-Out for Hemodialysis  What Procedure? Hemodialysis  Pt Identifiers(min of two) First/Last Name;MRN/Account#  Correct Site? Yes  Correct Side? Yes  Correct Procedure? Yes  Consents Verified? Yes  Rad Studies Available? N/A  Safety Precautions Reviewed? Yes  Engineer, civil (consulting) Number  (5)  Station Number 3  UF/Alarm Test Passed  Conductivity: Meter 14  Conductivity: Machine  14  pH 7.2  Reverse Osmosis main  Normal Saline Lot Number [id number]  Dialyzer Lot Number [id number]  Disposable Set Lot Number [id number]  Machine Temperature 98.6 F (37 C)  Musician and Audible Yes  Blood Lines Intact and Secured Yes  Pre Treatment Patient Checks  Vascular access used during treatment Catheter  Hepatitis B Surface Antigen Results  (unk)  Hepatitis B Surface Antibody  (unk)  Date Hepatitis B Surface Antibody Drawn 08/04/18  Hemodialysis Consent Verified Yes  Hemodialysis Standing Orders Initiated Yes  ECG (Telemetry) Monitor On Yes  Prime Ordered Normal Saline  Length of  DialysisTreatment -hour(s) 2.5 Hour(s)  Dialysis Treatment Comments pt stable   Dialyzer Elisio 17H NR  Dialysate 2K, 2.5 Ca  Dialysate Flow Ordered 500  Blood Flow Rate Ordered 250 mL/min  Pre Treatment Labs CBC;Renal panel  Dialysis Blood Pressure Support Ordered Normal Saline  Education / Care Plan  Dialysis Education  Provided Yes  Documented Education in Care Plan Yes

## 2018-08-05 NOTE — Progress Notes (Signed)
Post HD Assessment  Tx tolerated well at 2.5 hours and 250 BFR. Second HD treatment. C/o exit site (CVC) pain, given tylenol during tx with MD at bedside aware -- Dr. Juleen China observed exit site and spoke with patient at length during tx.   Total net UF fluid removal: 587mL.    08/05/18 1740  Neurological  Level of Consciousness Alert  Orientation Level Oriented X4  Respiratory  Respiratory Pattern Regular  Chest Assessment Chest expansion symmetrical  Bilateral Breath Sounds Diminished;Clear  Cardiac  Pulse Regular  ECG Monitor Yes  Vascular  R Radial Pulse +2  L Radial Pulse +2  Integumentary  Integumentary (WDL) X  Skin Color Appropriate for ethnicity  Skin Condition Dry  Musculoskeletal  Musculoskeletal (WDL) WDL  GU Assessment  Genitourinary (WDL) WDL  Psychosocial  Psychosocial (WDL) WDL

## 2018-08-06 LAB — CBC
HCT: 26.5 % — ABNORMAL LOW (ref 36.0–46.0)
Hemoglobin: 8.1 g/dL — ABNORMAL LOW (ref 12.0–15.0)
MCH: 26.9 pg (ref 26.0–34.0)
MCHC: 30.6 g/dL (ref 30.0–36.0)
MCV: 88 fL (ref 80.0–100.0)
PLATELETS: 241 10*3/uL (ref 150–400)
RBC: 3.01 MIL/uL — ABNORMAL LOW (ref 3.87–5.11)
RDW: 15.7 % — ABNORMAL HIGH (ref 11.5–15.5)
WBC: 6.9 10*3/uL (ref 4.0–10.5)
nRBC: 0 % (ref 0.0–0.2)

## 2018-08-06 LAB — RENAL FUNCTION PANEL
Albumin: 3.5 g/dL (ref 3.5–5.0)
Anion gap: 12 (ref 5–15)
BUN: 27 mg/dL — ABNORMAL HIGH (ref 8–23)
CO2: 28 mmol/L (ref 22–32)
Calcium: 8.6 mg/dL — ABNORMAL LOW (ref 8.9–10.3)
Chloride: 101 mmol/L (ref 98–111)
Creatinine, Ser: 3.71 mg/dL — ABNORMAL HIGH (ref 0.44–1.00)
GFR calc Af Amer: 14 mL/min — ABNORMAL LOW (ref >60)
GFR calc non Af Amer: 12 mL/min — ABNORMAL LOW (ref >60)
Glucose, Bld: 85 mg/dL (ref 70–99)
Phosphorus: 4.6 mg/dL (ref 2.5–4.6)
Potassium: 3.8 mmol/L (ref 3.5–5.1)
Sodium: 141 mmol/L (ref 135–145)

## 2018-08-06 LAB — HEPATITIS B SURFACE ANTIBODY, QUANTITATIVE: Hep B S AB Quant (Post): <3.1 m[IU]/mL — ABNORMAL LOW (ref >9.9)

## 2018-08-06 LAB — HEPATITIS PANEL, ACUTE
HCV Ab: <0.1 s/co ratio (ref 0.0–0.9)
Hep A IgM: NEGATIVE
Hep B C IgM: NEGATIVE
Hepatitis B Surface Ag: NEGATIVE

## 2018-08-06 LAB — HEPATITIS B SURFACE ANTIGEN: Hepatitis B Surface Ag: NEGATIVE

## 2018-08-06 MED ORDER — ENSURE ENLIVE PO LIQD
237.0000 mL | ORAL | Status: DC
  Administered 2018-08-07: 237 mL via ORAL

## 2018-08-06 NOTE — Plan of Care (Signed)
Nutrition Education Note  RD consulted for Renal Education.   Provided "Low sodium Nutrition Therapy" handout from the Academy of [employer]etics. Reviewed food groups and provided written recommended serving sizes specifically determined for patient's current nutritional status.   Explained why diet restrictions are needed and provided lists of foods to limit/avoid that are high potassium, sodium, and phosphorus. Provided specific recommendations on safer alternatives of these foods. Strongly encouraged compliance of this diet.   Discussed importance of protein intake at each meal and snack. Provided examples of how to maximize protein intake throughout the day. Discussed the possible need for fluid restriction with dialysis, importance of minimizing weight gain between HD treatments, and renal-friendly beverage options.  Encouraged pt to take a daily renal multivitamin   Encouraged pt to discuss specific diet questions/concerns with RD at HD outpatient facility. Teach back method used.  Expect good compliance.  Body mass index is 44.6 kg/m. Pt meets criteria for morbid obesity based on current BMI.  Current diet order is renal/CHO modified, patient is consuming approximately 95% of meals at this time. Labs and medications reviewed. No further nutrition interventions warranted at this time. RD contact information provided. If additional nutrition issues arise, please re-consult RD.  Koleen Distance MS, RD, LDN Pager #- [phone] Office#- [phone] After Hours Pager: [phone]

## 2018-08-06 NOTE — Progress Notes (Signed)
HD Tx Start   08/06/18 1205  Vital Signs  Pulse Rate 80  Resp 13  BP 138/81  Oxygen Therapy  SpO2 100 %  During Hemodialysis Assessment  Blood Flow Rate (mL/min) 300 mL/min  Arterial Pressure (mmHg) -180 mmHg  Venous Pressure (mmHg) 160 mmHg  Transmembrane Pressure (mmHg) 50 mmHg  Ultrafiltration Rate (mL/min) 330 mL/min  Dialysate Flow Rate (mL/min) 600 ml/min  Conductivity: Machine  14  HD Safety Checks Performed Yes  Dialysis Fluid Bolus Normal Saline  Bolus Amount (mL) 250 mL  Intra-Hemodialysis Comments Tx initiated

## 2018-08-06 NOTE — Progress Notes (Signed)
[facility]  ROUNDING NOTE   Subjective:   Seen and examined on hemodialysis. Third treatment. Tolerating treatment well. Seated in chair.   Objective:  Vital signs in last 24 hours:  Temp:  [98.6 F (37 C)-99.4 F (37.4 C)] 98.6 F (37 C) (03/04 0558) Pulse Rate:  [72-89] 72 (03/04 0813) Resp:  [11-22] 20 (03/04 0558) BP: (128-173)/(77-94) 128/77 (03/04 0813) SpO2:  [92 %-99 %] 96 % (03/04 0558) Weight:  [114.3 kg-115.2 kg] 114.3 kg (03/04 0558)  Weight change: -0.014 kg Filed Weights   08/05/18 1420 08/05/18 1730 08/06/18 0558  Weight: 115.2 kg 114.7 kg 114.3 kg    Intake/Output: I/O last 3 completed shifts: In: 360 [P.O.:360] Out: 1000 [Urine:500; Other:500]   Intake/Output this shift:  Total I/O In: 240 [P.O.:240] Out: -   Physical Exam: General: NAD,   Head: Normocephalic, atraumatic. Moist oral mucosal membranes  Eyes: Anicteric, PERRL  Neck: Supple, trachea midline  Lungs:  Clear to auscultation  Heart: Regular rate and rhythm  Abdomen:  Soft, nontender,   Extremities: No bilateral  peripheral edema.  Neurologic: Nonfocal, moving all four extremities  Skin: No lesions  Access: RIJ permcath 3/2 Dr. Lucky [name]    Basic Metabolic Panel: Recent Labs  Lab 08/04/18 2047 08/05/18 0523  NA 142 141  K 3.8 4.3  CL 105 103  CO2 29 27  GLUCOSE 94 108*  BUN 30* 36*  CREATININE 2.95* 3.64*  CALCIUM 7.8* 8.1*  PHOS 3.1  --     Liver Function Tests: Recent Labs  Lab 08/04/18 2047  ALBUMIN 3.6   No results for input(s): LIPASE, AMYLASE in the last 168 hours. No results for input(s): AMMONIA in the last 168 hours.  CBC: Recent Labs  Lab 08/05/18 0523  WBC 6.7  HGB 7.9*  HCT 25.5*  MCV 87.6  PLT 225    Cardiac Enzymes: No results for input(s): CKTOTAL, CKMB, CKMBINDEX, TROPONINI in the last 168 hours.  BNP: Invalid input(s): POCBNP  CBG: No results for input(s): GLUCAP in the last 168 hours.  Microbiology: Results for orders  placed or performed during the hospital encounter of 04/30/17  MRSA PCR Screening     Status: None   Collection Time: 04/30/17  8:09 PM  Result Value Ref Range Status   MRSA by PCR NEGATIVE NEGATIVE Final    Comment:        The GeneXpert MRSA Assay (FDA approved for NASAL specimens only), is one component of a comprehensive MRSA colonization surveillance program. It is not intended to diagnose MRSA infection nor to guide or monitor treatment for MRSA infections.     Coagulation Studies: No results for input(s): LABPROT, INR in the last 72 hours.  Urinalysis: No results for input(s): COLORURINE, LABSPEC, PHURINE, GLUCOSEU, HGBUR, BILIRUBINUR, KETONESUR, PROTEINUR, UROBILINOGEN, NITRITE, LEUKOCYTESUR in the last 72 hours.  Invalid input(s): APPERANCEUR    Imaging: No results found.   Medications:   . sodium chloride     . amLODipine  10 mg Oral Daily  . atorvastatin  40 mg Oral q1800  . calcitRIOL  0.25 mcg Oral Daily  . Chlorhexidine Gluconate Cloth  6 each Topical Q0600  . feeding supplement (ENSURE ENLIVE)  237 mL Oral Q24H  . heparin  5,000 Units Subcutaneous Q8H  . levETIRAcetam  1,000 mg Oral BID  . losartan  50 mg Oral Daily  . metoprolol succinate  25 mg Oral Daily  . multivitamin  1 tablet Oral QHS  . sodium bicarbonate  1,300 mg Oral BID  . sodium chloride flush  3 mL Intravenous Q12H  . torsemide  10 mg Oral Daily   sodium chloride, acetaminophen **OR** acetaminophen, HYDROcodone-acetaminophen, ondansetron **OR** ondansetron (ZOFRAN) IV, polyethylene glycol, sodium chloride flush  Assessment/ Plan:  Carrie [name] is a 68 y.o. black female with chronic kidney disease stage V, hypertension, migraine headache disorder, allergic rhinitis, diabetes mellitus type II, history of meningioma, osteoarthritis, anxiety, hyperlipidemia, diastolic congestive heart failure  1. End Stage Renal Disease: first hemodialysis 3/2.  - Pending quantiferon gold  -  Outpatient planning for [facility] TTS schedule.    2. Hypertension with edema:  Home regimen of losartan, torsemide, and amlodipine.   3. Diabetes Mellitus type II with chronic kidney disease: Currently diet controlled.   4. Secondary Hyperparathyroidism with Vitamin D deficiency: Currently on calcitriol 0.25 mcg    5. Anemia with chronic kidney disease:   - EPO with HD treatment    LOS: 2 Carrie [name] 3/4/202012:01 PM

## 2018-08-06 NOTE — Progress Notes (Signed)
Pre HD Tx Assessment   08/06/18 1200  Neurological  Level of Consciousness Alert  Orientation Level Oriented X4  Respiratory  Respiratory Pattern Regular  Chest Assessment Chest expansion symmetrical  Bilateral Breath Sounds Diminished  Cardiac  Pulse Regular  ECG Monitor Yes  Vascular  R Radial Pulse +2  L Radial Pulse +2  Integumentary  Integumentary (WDL) WDL  Skin Color Appropriate for ethnicity  Skin Condition Dry  Musculoskeletal  Musculoskeletal (WDL) WDL  Psychosocial  Psychosocial (WDL) WDL

## 2018-08-06 NOTE — Progress Notes (Signed)
Post HD Assessment  Pt removed net 566mL of fluid during hemodialysis today over a 3 hour span, at BFR 300, DFR 650. Treated in chair, tolerated well. Waiting on Quantiferon lab result for patient to be discharged to be treated at dialysis facility (patient chose [facility]).     08/06/18 1530  Neurological  Level of Consciousness Alert  Orientation Level Oriented X4  Respiratory  Respiratory Pattern Regular  Chest Assessment Chest expansion symmetrical  Bilateral Breath Sounds Diminished  Cardiac  Pulse Regular  ECG Monitor Yes  Vascular  R Radial Pulse +2  L Radial Pulse +2  Integumentary  Integumentary (WDL) WDL  Skin Color Appropriate for ethnicity  Skin Condition Dry  Musculoskeletal  Musculoskeletal (WDL) WDL  Psychosocial  Psychosocial (WDL) WDL

## 2018-08-06 NOTE — Progress Notes (Signed)
[facility] [place] NAME: Carrie [name]    MR#:  [id number]  DATE OF BIRTH:  Jan 27, 1951  SUBJECTIVE:   Patient continues to feel well.  Dialysis is going just fine.  She feels like her swelling is improving.  She denies any chest pain or shortness of breath.  She is looking forward to leaving the hospital tomorrow.   REVIEW OF SYSTEMS:  Review of Systems  Constitutional: Negative for chills, fever and malaise/fatigue.  HENT: Negative for congestion and sore throat.   Eyes: Negative for blurred vision and double vision.  Respiratory: Negative for cough and shortness of breath.   Cardiovascular: Negative for chest pain, palpitations and leg swelling.  Gastrointestinal: Negative for nausea and vomiting.  Genitourinary: Negative for dysuria and urgency.  Musculoskeletal: Negative for back pain and neck pain.  Neurological: Positive for weakness. Negative for dizziness and headaches.  Psychiatric/Behavioral: Negative for depression and suicidal ideas.    DRUG ALLERGIES:   Allergies  Allergen Reactions  . Naproxen Sodium Anaphylaxis and Swelling  . Sulfamethoxazole-Trimethoprim Anaphylaxis and Swelling   VITALS:  Blood pressure 133/71, pulse 76, temperature 98.2 F (36.8 C), temperature source Oral, resp. rate 12, height 5\' 3"  (1.6 m), weight 114.3 kg, SpO2 100 %. PHYSICAL EXAMINATION:  Physical Exam  GENERAL:  Laying in the bed with no acute distress.  Well-appearing.  Pleasant and conversational. HEENT: Head atraumatic, normocephalic. Pupils equal, round, reactive to light and accommodation. No scleral icterus. Extraocular muscles intact. Oropharynx and nasopharynx clear.  NECK:  Supple, no jugular venous distention. No thyroid enlargement. LUNGS: Lungs are clear to auscultation bilaterally. No wheezes, crackles, rhonchi. No use of accessory muscles of respiration.  CARDIOVASCULAR: RRR, S1, S2 normal. No murmurs, rubs, or gallops. +  Tunneled dialysis catheter in right chest, without surrounding erythema. ABDOMEN: Soft, nontender, nondistended. Bowel sounds present.  EXTREMITIES: No pedal edema, cyanosis, or clubbing.  NEUROLOGIC: CN 2-12 intact, no focal deficits. 5/5 muscle strength throughout all extremities. Sensation intact throughout. Gait not checked.  PSYCHIATRIC: The patient is alert and oriented x 3.  SKIN: No obvious rash, lesion, or ulcer.  LABORATORY PANEL:  Female CBC Recent Labs  Lab 08/05/18 0523  WBC 6.7  HGB 7.9*  HCT 25.5*  PLT 225   ------------------------------------------------------------------------------------------------------------------ Chemistries  Recent Labs  Lab 08/05/18 0523  NA 141  K 4.3  CL 103  CO2 27  GLUCOSE 108*  BUN 36*  CREATININE 3.64*  CALCIUM 8.1*   RADIOLOGY:  No results found. ASSESSMENT AND PLAN:   New ESRD requiring dialysis- has been tolerating HD without any issues - Tunneled dialysis catheter placed by vascular 3/2 - Plan for daily dialysis sessions through Thursday 3/5 -Nephrology following  Chronic diastolic heart failure- stable, no signs of acute exacerbation -Fluid management per nephro -Continue torsemide -Would benefit from hospital bed at home to keep head elevated at 30 degrees, as patient has significant shortness of breath with lying flat.  Essential hypertension- BPs well-controlled -Continue Norvasc, losartan, metoprolol  Hyperlipidemia: Continue lipitor  Anemia of chronic kidney disease- Hgb 7.9 -Receiving EPO with dialysis -Monitor  Plan for discharge home tomorrow after dialysis if outpatient HD has been set up.  All the records are reviewed and case discussed with Care Management/Social Worker. Management plans discussed with the patient, family and they are in agreement.  CODE STATUS: Full Code  TOTAL TIME TAKING CARE OF THIS PATIENT: 35 minutes.   More than 50% of the time  was spent in counseling/coordination of  care: YES  POSSIBLE D/C tomorrow, DEPENDING ON CLINICAL CONDITION.   Berna Spare Dunbar Buras M.D on 08/06/2018 at 1:56 PM  Between 7am to 6pm - Pager - [phone]  After 6pm go to [email or web address] - Proofreader  [facility]  Office  [phone]  CC: Primary care physician; Venia Carbon, MD  Note: This dictation was prepared with Dragon dictation along with smaller phrase technology. Any transcriptional errors that result from this process are unintentional.

## 2018-08-06 NOTE — Progress Notes (Signed)
HD Tx End   08/06/18 1500  Hand-Off documentation  Report given to (Full Name) Lum Babe RN  Report received from (Full Name) Trellis Paganini RN  Vital Signs  Temp 98.4 F (36.9 C)  Temp Source Oral  Pulse Rate 77  Pulse Rate Source Monitor  Resp 14  BP 130/84  BP Location Right Arm  BP Method Automatic  Patient Position (if appropriate) Lying  Oxygen Therapy  SpO2 100 %  O2 Device Room Air  Pain Assessment  Pain Scale 0-10  Pain Score 0  Dialysis Weight  Weight 113.8 kg  Type of Weight Post-Dialysis  During Hemodialysis Assessment  Blood Flow Rate (mL/min) 200 mL/min  Arterial Pressure (mmHg) -180 mmHg  Venous Pressure (mmHg) 210 mmHg  Transmembrane Pressure (mmHg) 50 mmHg  Ultrafiltration Rate (mL/min) 330 mL/min  Dialysate Flow Rate (mL/min) 600 ml/min  Conductivity: Machine  14  HD Safety Checks Performed Yes  Dialysis Fluid Bolus Normal Saline  Bolus Amount (mL) 250 mL  Intra-Hemodialysis Comments Tx completed;Tolerated well

## 2018-08-06 NOTE — Progress Notes (Signed)
Post HD Tx   08/06/18 1515  Vital Signs  Pulse Rate 75  Resp 17  BP 137/86  Post-Hemodialysis Assessment  Rinseback Volume (mL) 250 mL  KECN 52.9 V  Dialyzer Clearance Heavily streaked  Duration of HD Treatment -hour(s) 3 hour(s)  Hemodialysis Intake (mL) 1000 mL  UF Total -Machine (mL) 1020 mL  Net UF (mL) 20 mL  Tolerated HD Treatment Yes  Post-Hemodialysis Comments pt stable. tolerated tx well (catheter capped and clamped, heparin locked)

## 2018-08-06 NOTE — Care Management (Signed)
PatientsuffersfromCHFandhastroublebreathingatnightwhenheadiselevatedlessthan30degrees.Bedwedgesdonotprovideenoughelevationtoresolvebreathingissues.  SOB causepatienttorequirefrequentchangesinbodypositionwhichcannotbeachievedwithanormalbed

## 2018-08-06 NOTE — Progress Notes (Signed)
Pre HD Tx   08/06/18 1200  Hand-Off documentation  Report given to (Full Name) Trellis Paganini RN  Report received from (Full Name) Lum Babe RN  Vital Signs  Temp 98.2 F (36.8 C)  Temp Source Oral  Pulse Rate 75  Pulse Rate Source Monitor  Resp 13  BP 132/80  BP Location Right Arm  BP Method Automatic  Patient Position (if appropriate) Lying  Oxygen Therapy  SpO2 99 %  O2 Device Room Air  Pain Assessment  Pain Scale 0-10  Pain Score 0  Dialysis Weight  Weight 114.3 kg  Type of Weight Pre-Dialysis  Education / Care Plan  Dialysis Education Provided Yes  Documented Education in Care Plan Yes

## 2018-08-07 ENCOUNTER — Inpatient Hospital Stay: Payer: Medicare HMO

## 2018-08-07 LAB — QUANTIFERON-TB GOLD PLUS (RQFGPL)
QuantiFERON Nil Value: 0.04 IU/mL
QuantiFERON TB1 Ag Value: 0.04 IU/mL
QuantiFERON TB2 Ag Value: 0.03 IU/mL

## 2018-08-07 LAB — QUANTIFERON-TB GOLD PLUS: QuantiFERON-TB Gold Plus: NEGATIVE

## 2018-08-07 MED ORDER — LEVETIRACETAM 500 MG PO TABS: ORAL_TABLET | ORAL | 0 refills | Status: DC

## 2018-08-07 MED ORDER — HYDROCODONE-ACETAMINOPHEN 5-325 MG PO TABS: 1.0000 | ORAL_TABLET | Freq: Four times a day (QID) | ORAL | 0 refills | Status: DC | PRN

## 2018-08-07 MED ORDER — ENSURE ENLIVE PO LIQD: 237.0000 mL | ORAL | 0 refills | Status: DC

## 2018-08-07 MED ORDER — CALCITRIOL 0.25 MCG PO CAPS: 0.2500 ug | ORAL_CAPSULE | Freq: Every day | ORAL | 0 refills | Status: DC

## 2018-08-07 MED ORDER — RENA-VITE PO TABS: 1.0000 | ORAL_TABLET | Freq: Every day | ORAL | 0 refills | Status: AC

## 2018-08-07 MED ORDER — LEVETIRACETAM 500 MG PO TABS: 500.0000 mg | ORAL_TABLET | ORAL | Status: DC

## 2018-08-07 NOTE — Progress Notes (Signed)
Post tx documentation   08/07/18 1300  Vital Signs  Temp 98.4 F (36.9 C)  Temp Source Oral  Pulse Rate Source Monitor  BP Location Right Arm  BP Method Automatic  Patient Position (if appropriate) Lying  Post-Hemodialysis Assessment  Rinseback Volume (mL) 250 mL  KECN 264 V  Dialyzer Clearance Lightly streaked  Duration of HD Treatment -hour(s) 3 hour(s)  Hemodialysis Intake (mL) 0 mL  UF Total -Machine (mL) 1000 mL  Net UF (mL) 1000 mL  Tolerated HD Treatment Yes  Post-Hemodialysis Comments Pt stable  Hemodialysis Catheter Right Subclavian  Placement Date/Time: (c) 08/07/18 (c) 0786   Orientation: Right  Access Location: Subclavian  Site Condition No complications  Blue Lumen Status Heparin locked  Red Lumen Status Heparin locked  Catheter fill solution Heparin 1000 units/ml  Catheter fill volume (Arterial) 1.5 cc  Catheter fill volume (Venous) 1.5  Dressing Type Biopatch;Occlusive  Dressing Status Clean;Dry;Intact  Post treatment catheter status Capped and Clamped

## 2018-08-07 NOTE — Care Management Note (Signed)
Case Management Note  Patient Details  Name: Carrie [name] MRN: [id number] Date of Birth: 09/01/1950   Patient to discharge home today.  Patient lives at home with 2 sisters and a niece.  PCP Lubertha Sayres, pharmacy [facility]. Patient outpatient HD schedule is as follows [facility] at 7:20 am.  Patient to start outpatient Tuesday.  Family to transport to HD and appointments.  Patient states that she has a RW and Cane in the home.    Referral for hospital bed was made to [facility] with [place], delivery to be arranged for today.   Home health RN and heart failure/REDS vest protocol signed by MD.  Patient agreeable to home health.  CMS Medicare.gov Compare Post Acute Care list reviewed with patient and sister.  [facility] was selected.  Referral was made to [place] with [facility], and heart failure protocol faxed.  Subjective/Objective:                    Action/Plan:   Expected Discharge Date:  08/07/18               Expected Discharge Plan:  [place]  In-House Referral:     Discharge planning Services  CM Consult  Post Acute Care Choice:  Home Health, Durable Medical Equipment Choice offered to:  Patient  DME Arranged:  Hospital bed DME Agency:  [facility]  HH Arranged:  RN [place] Agency:  [facility] ([facility]  Status of Service:  Completed, signed off  If discussed at [employer], dates discussed:    Additional Comments:  Beverly Sessions, RN 08/07/2018, 2:37 PM

## 2018-08-07 NOTE — Progress Notes (Signed)
   08/07/18 1215  Vital Signs  Pulse Rate Source Monitor  BP Location Right Arm  BP Method Automatic  Patient Position (if appropriate) Lying  During Hemodialysis Assessment  Blood Flow Rate (mL/min) 400 mL/min  Arterial Pressure (mmHg) -190 mmHg  Venous Pressure (mmHg) 140 mmHg  Transmembrane Pressure (mmHg) 40 mmHg  Ultrafiltration Rate (mL/min) 340 mL/min  Dialysate Flow Rate (mL/min) 600 ml/min  Conductivity: Machine  13.9  HD Safety Checks Performed Yes  Intra-Hemodialysis Comments Progressing as prescribed (801 mls UF)   Pt alert , watching TV .

## 2018-08-07 NOTE — Progress Notes (Signed)
   08/07/18 1300  Neurological  Level of Consciousness Alert  Orientation Level Oriented X4  Respiratory  Respiratory Pattern Regular;Unlabored  Chest Assessment Chest expansion symmetrical  Bilateral Breath Sounds Clear;Diminished  R Upper  Breath Sounds Clear  L Upper Breath Sounds Clear  R Lower Breath Sounds Diminished  L Lower Breath Sounds Diminished  Cough Weak  Cardiac  Pulse Regular  Heart Sounds S1, S2  Vascular  R Radial Pulse +2  L Radial Pulse +2  R Dorsalis Pedis Pulse +1  L Dorsalis Pedis Pulse +1  Psychosocial  Psychosocial (WDL) WDL   Post dialysis assessment

## 2018-08-07 NOTE — Care Management Important Message (Signed)
Copy of signed Medicare IM left with patient's family in room.

## 2018-08-07 NOTE — Progress Notes (Signed)
   08/07/18 1145  Vital Signs  Pulse Rate Source Monitor  BP Location Right Arm  BP Method Automatic  Patient Position (if appropriate) Lying  During Hemodialysis Assessment  Blood Flow Rate (mL/min) 400 mL/min  Arterial Pressure (mmHg) -180 mmHg  Venous Pressure (mmHg) 130 mmHg  Transmembrane Pressure (mmHg) 40 mmHg  Ultrafiltration Rate (mL/min) 340 mL/min  Dialysate Flow Rate (mL/min) 600 ml/min  Conductivity: Machine  13.9  HD Safety Checks Performed Yes  Intra-Hemodialysis Comments Progressing as prescribed   Pt stable , watching TV

## 2018-08-07 NOTE — Progress Notes (Signed)
   08/07/18 1015  Vital Signs  Pulse Rate Source Monitor  BP Location Right Arm  BP Method Automatic  Patient Position (if appropriate) Lying  During Hemodialysis Assessment  Blood Flow Rate (mL/min) 400 mL/min  Arterial Pressure (mmHg) -170 mmHg  Venous Pressure (mmHg) 130 mmHg  Transmembrane Pressure (mmHg) 40 mmHg  Ultrafiltration Rate (mL/min) 330 mL/min  Dialysate Flow Rate (mL/min) 600 ml/min  Conductivity: Machine  14.1  HD Safety Checks Performed Yes  Intra-Hemodialysis Comments Progressing as prescribed  Note  Observations  (105 mls UF)   Pt. Watching TV, vitals WNL, denies any c/o

## 2018-08-07 NOTE — Progress Notes (Signed)
   08/07/18 1250  Vital Signs  Temp 98.6 F (37 C)  Temp Source Oral  Pulse Rate Source Monitor  BP Location Right Arm  BP Method Automatic  Patient Position (if appropriate) Lying  During Hemodialysis Assessment  Blood Flow Rate (mL/min) 400 mL/min  Arterial Pressure (mmHg) -220 mmHg  Venous Pressure (mmHg) 140 mmHg  Transmembrane Pressure (mmHg) 40 mmHg  Ultrafiltration Rate (mL/min) 340 mL/min  Dialysate Flow Rate (mL/min) 600 ml/min  Conductivity: Machine  14.1  HD Safety Checks Performed Yes  Intra-Hemodialysis Comments Tx completed;Tolerated well   Pt stable, tx ended, tolerated tx without problems.

## 2018-08-07 NOTE — Progress Notes (Signed)
   08/07/18 1100  Vital Signs  Pulse Rate Source Monitor  BP Location Right Arm  BP Method Automatic  Patient Position (if appropriate) Lying  During Hemodialysis Assessment  Blood Flow Rate (mL/min) 400 mL/min  Arterial Pressure (mmHg) -180 mmHg  Venous Pressure (mmHg) 130 mmHg  Transmembrane Pressure (mmHg) 40 mmHg  Ultrafiltration Rate (mL/min) 340 mL/min  Dialysate Flow Rate (mL/min) 600 ml/min  Conductivity: Machine  13.9  HD Safety Checks Performed Yes  Intra-Hemodialysis Comments Progressing as prescribed   Pt sleeping comfortably.

## 2018-08-07 NOTE — Progress Notes (Signed)
   08/07/18 1045  Vital Signs  Pulse Rate Source Monitor  BP Location Right Arm  BP Method Automatic  Patient Position (if appropriate) Lying  During Hemodialysis Assessment  Blood Flow Rate (mL/min) 400 mL/min  Arterial Pressure (mmHg) -180 mmHg  Venous Pressure (mmHg) 120 mmHg  Transmembrane Pressure (mmHg) 40 mmHg  Ultrafiltration Rate (mL/min) 330 mL/min  Dialysate Flow Rate (mL/min) 600 ml/min  Conductivity: Machine  13.9  HD Safety Checks Performed Yes  Intra-Hemodialysis Comments Progressing as prescribed   Pt. Sleeping comfortably

## 2018-08-07 NOTE — Discharge Summary (Addendum)
[facility] [place] NAME: Carrie [name]    MR#:  [id number]  DATE OF BIRTH:  08-06-1950  DATE OF ADMISSION:  08/04/2018 ADMITTING PHYSICIAN: Sela Hua, MD  DATE OF DISCHARGE: 08/07/2018  PRIMARY CARE PHYSICIAN: Venia Carbon, MD    ADMISSION DIAGNOSIS:  ESRD uremia  DISCHARGE DIAGNOSIS:  Active Problems:   ESRD (end stage renal disease) ([place])   SECONDARY DIAGNOSIS:   Past Medical History:  Diagnosis Date  . Allergy   . Arthritis    "right knee" (10/26'/2017)  . CKD (chronic kidney disease), stage III ([place])    12/2015 (Dr. Lavonia Dana)  . Depression   . History of blood transfusion 2008   "when I had brain tumor surgery"  . History of MRSA infection 2008  . Hypertension   . Meningioma ([place])    Foramen magnum  . Type II diabetes mellitus ([place]) 2011   on metformin until yr ago- no med now - off due to kidneys    HOSPITAL COURSE:   1.  Uremia.  Patient sent in for direct admit to start dialysis.  Patient had a PermCath placed in the right chest.  She is now classified as end-stage renal disease with dialysis Tuesday Thursday and Saturday.  Patient was dialyzed 4 times while here in the hospital. 2.  Chronic diastolic congestive heart failure.  Continue torsemide. 3.  Hypertension on Norvasc losartan and metoprolol 4.  Hyperlipidemia on atorvastatin 5.  History of seizure on Keppra.  Dosing of Keppra 1000 twice a day with an extra pill after dialysis as per nephrology. 6.  Anemia of chronic disease.  Consider Procrit with dialysis.  Last hemoglobin 8.1.  No signs of bleeding. 7.  Patient asked for a few pain pills to go home with.  Prescribed 5 pills.  DISCHARGE CONDITIONS:   Satisfactory  CONSULTS OBTAINED:  Treatment Team:  Lavonia Dana, MD  DRUG ALLERGIES:   Allergies  Allergen Reactions  . Naproxen Sodium Anaphylaxis and Swelling  . Sulfamethoxazole-Trimethoprim Anaphylaxis and Swelling     DISCHARGE MEDICATIONS:   Allergies as of 08/07/2018      Reactions   Naproxen Sodium Anaphylaxis, Swelling   Sulfamethoxazole-trimethoprim Anaphylaxis, Swelling      Medication List    STOP taking these medications   NON FORMULARY     TAKE these medications   amLODipine 10 MG tablet Commonly known as:  NORVASC TAKE 1 TABLET BY MOUTH EVERY DAY   atorvastatin 40 MG tablet Commonly known as:  LIPITOR TAKE 40 MG BY MOUTH ONCE DAILY What changed:  See the new instructions.   calcitRIOL 0.25 MCG capsule Commonly known as:  ROCALTROL Take 1 capsule (0.25 mcg total) by mouth daily.   feeding supplement (ENSURE ENLIVE) Liqd Take 237 mLs by mouth daily. Start taking on:  August 08, 2018   HYDROcodone-acetaminophen 5-325 MG tablet Commonly known as:  NORCO/VICODIN Take 1 tablet by mouth every 6 (six) hours as needed for severe pain.   levETIRAcetam 1000 MG tablet Commonly known as:  KEPPRA TAKE ONE TABLET BY MOUTH TWICE DAILY What changed:  Another medication with the same name was added. Make sure you understand how and when to take each.   levETIRAcetam 500 MG tablet Commonly known as:  KEPPRA Please take an extra kepra pill orally after dialysis What changed:  You were already taking a medication with the same name, and this prescription was added. Make sure you understand how and when  to take each.   LORazepam 0.5 MG tablet Commonly known as:  ATIVAN TAKE 1/2 TO 1 TABLET BY MOUTH 3 TIMES A DAY AS NEEDED What changed:    how much to take  how to take this  when to take this  reasons to take this  additional instructions   losartan 50 MG tablet Commonly known as:  COZAAR Take 50 mg by mouth daily.   metoprolol succinate 25 MG 24 hr tablet Commonly known as:  TOPROL-XL TAKE 1 TABLET BY MOUTH EVERY DAY   multivitamin Tabs tablet Take 1 tablet by mouth at bedtime.   torsemide 10 MG tablet Commonly known as:  DEMADEX Take 10 mg by mouth daily.             Durable Medical Equipment  (From admission, onward)         Start     Ordered   08/06/18 1144  For home use only DME Hospital bed  Once    Question Answer Comment  Patient has (list medical condition): CHF   The above medical condition requires: Patient requires the ability to reposition frequently   Head must be elevated greater than: 45 degrees   Bed type Semi-electric      08/06/18 1143           DISCHARGE INSTRUCTIONS:   Follow-up PMD 5 days Follow-up with dialysis on Tuesday  If you experience worsening of your admission symptoms, develop shortness of breath, life threatening emergency, suicidal or homicidal thoughts you must seek medical attention immediately by calling 911 or calling your MD immediately  if symptoms less severe.  You Must read complete instructions/literature along with all the possible adverse reactions/side effects for all the Medicines you take and that have been prescribed to you. Take any new Medicines after you have completely understood and accept all the possible adverse reactions/side effects.   Please note  You were cared for by a hospitalist during your hospital stay. If you have any questions about your discharge medications or the care you received while you were in the hospital after you are discharged, you can call the unit and asked to speak with the hospitalist on call if the hospitalist that took care of you is not available. Once you are discharged, your primary care physician will handle any further medical issues. Please note that NO REFILLS for any discharge medications will be authorized once you are discharged, as it is imperative that you return to your primary care physician (or establish a relationship with a primary care physician if you do not have one) for your aftercare needs so that they can reassess your need for medications and monitor your lab values.    Today   CHIEF COMPLAINT:  No chief complaint on  file.   HISTORY OF PRESENT ILLNESS:  Carrie [name]  is a 68 y.o. female sent in for direct admit to initiate dialysis for uremic symptoms   VITAL SIGNS:  Blood pressure (!) 126/52, pulse 79, temperature 98.3 F (36.8 C), temperature source Oral, resp. rate 18, height 5\' 3"  (1.6 m), weight 113.8 kg, SpO2 96 %.    PHYSICAL EXAMINATION:  GENERAL:  68 y.o.-year-old patient lying in the bed with no acute distress.  EYES: Pupils equal, round, reactive to light and accommodation. No scleral icterus. Extraocular muscles intact.  HEENT: Head atraumatic, normocephalic. Oropharynx and nasopharynx clear.  NECK:  Supple, no jugular venous distention. No thyroid enlargement, no tenderness.  LUNGS: Normal breath sounds bilaterally,  no wheezing, rales,rhonchi or crepitation. No use of accessory muscles of respiration.  CARDIOVASCULAR: S1, S2 normal. No murmurs, rubs, or gallops.  ABDOMEN: Soft, non-tender, non-distended. Bowel sounds present. No organomegaly or mass.  EXTREMITIES: No pedal edema, cyanosis, or clubbing.  NEUROLOGIC: Cranial nerves II through XII are intact. Muscle strength 5/5 in all extremities. Sensation intact. Gait not checked.  PSYCHIATRIC: The patient is alert and oriented x 3.  SKIN: No obvious rash, lesion, or ulcer.   DATA REVIEW:   CBC Recent Labs  Lab 08/06/18 1422  WBC 6.9  HGB 8.1*  HCT 26.5*  PLT 241    Chemistries  Recent Labs  Lab 08/06/18 1422  NA 141  K 3.8  CL 101  CO2 28  GLUCOSE 85  BUN 27*  CREATININE 3.71*  CALCIUM 8.6*     Microbiology Results  Results for orders placed or performed during the hospital encounter of 04/30/17  MRSA PCR Screening     Status: None   Collection Time: 04/30/17  8:09 PM  Result Value Ref Range Status   MRSA by PCR NEGATIVE NEGATIVE Final    Comment:        The GeneXpert MRSA Assay (FDA approved for NASAL specimens only), is one component of a comprehensive MRSA colonization surveillance program. It is  not intended to diagnose MRSA infection nor to guide or monitor treatment for MRSA infections.     RADIOLOGY:  Dg Chest 2 View  Result Date: 08/07/2018 CLINICAL DATA:  Shortness of breath EXAM: CHEST - 2 VIEW COMPARISON:  10/01/2017 FINDINGS: Cardiac shadow remains enlarged. Aortic calcifications are again seen. Dialysis catheter is noted on the right in satisfactory position. Very mild central vascular congestion is noted likely related to volume overload. No focal infiltrate or effusion is seen. No acute bony abnormality is noted. IMPRESSION: Mild central vascular congestion likely related to volume overload. Electronically Signed   By: Inez Catalina M.D.   On: 08/07/2018 10:32     Management plans discussed with the patient, family and they are in agreement.  CODE STATUS:     Code Status Orders  (From admission, onward)         Start     Ordered   08/04/18 1545  Full code  Continuous     08/04/18 1545        Code Status History    Date Active Date Inactive Code Status Order ID Comments User Context   04/30/2017 1807 05/03/2017 1603 Full Code [id number]  Mcarthur Rossetti, MD Inpatient   03/27/2016 1628 03/30/2016 1914 Full Code [id number]  Mcarthur Rossetti, MD Inpatient      TOTAL TIME TAKING CARE OF THIS PATIENT: 35 minutes.    Loletha Grayer M.D on 08/07/2018 at 2:38 PM  Between 7am to 6pm - Pager - [phone]  After 6pm go to [email or web address] - password [email or web address]  [facility] Office  [phone]  CC: Primary care physician; Venia Carbon, MD

## 2018-08-07 NOTE — Care Management (Signed)
List provided to patient  [place] InformationSorted ascending, Select to sort descending  Quality of Patient Care RatingSelect to sort ascending or descending . A rating of 4 or more stars means the agency performed better than most other agencies on selected measures. . A rating of 3 to 3 stars means that the agency performed about the same as most agencies. . A rating of fewer than 3 stars means that the agency's performance was below the average of other agencies on selected measures." class="info" href="javascript:void(0)". A rating of 4 or more stars means the agency performed better than most other agencies on selected measures. . A rating of 3 to 3 stars means that the agency performed about the same as most agencies. . A rating of fewer than 3 stars means that the agency's performance was below the average of other agencies on selected measures." class="info" href="javascript:void(0)"  Patient Survey Summary RatingSelect to sort ascending or descending    [facility] [phone]   [place] to my Favorites  Quality of Patient Care Rating3 out of 5 stars Patient Survey Summary Rating4 out of [place] [phone]   [place] to my [place]  out of 5 stars Patient Survey Summary Rating4 out of [place] [phone]) [phone]   Add [facility], [place] to my Favorites  Quality of Patient Care Rating4 out of 5 stars Patient Survey Summary Rating4 out of [place] [phone]   [place] to my Favorites  Quality of Patient Care Rating4  out of 5 stars Patient Survey Summary Rating4 out of [place] [phone]   [place] to my Favorites  Quality of Patient Care Rating4 out of 5 stars Patient Survey Summary Rating4 out of [place] AGE [phone]   [place] AGE to my Favorites  Quality of Patient Care Rating3  out of 5 stars Not [place] [phone]   [place] to my Favorites  Quality of Patient Care Rating3 out of 5 stars Patient Survey Summary Rating3 out of [place] [phone]   [place] to my [place]  out of 5 stars Patient [place] out of [place] [phone]   Add [facility] [place] to my Favorites  Quality of Patient Care Rating3  out of 5 stars Patient Survey Summary Rating3 out of [place] [phone]   [place] to my Favorites  Quality of Patient Care Rating3 out of 5 stars Patient Survey Summary Rating4 out of [place] [phone]   [place] to my Favorites  Quality of Patient Care Rating3 out of 5 stars Patient Survey Summary Rating3 out of 5 stars  [facility] [phone]   Add [facility] to my Favorites  Quality of Patient Care Rating3 out of 5 stars Patient Survey Summary Rating2 out of [place] [phone]   [place] to my  Favorites  Quality of Patient Care Rating3  out of 5 stars Patient Survey Summary Rating4 out of [place] [phone]   [place] to my Favorites  Quality of Patient Care Rating2  out of 5 stars Patient Survey Summary Rating4 out of [place] [phone]   [place] to my Favorites  Not Available4 Not [place] [phone]   [place] to my Favorites  Quality of Patient Care Rating2  out of 5 stars Patient Survey Summary [place] out of [place] [phone] [place] to my Favorites  Quality of Patient Care Rating5 out of 5 stars Patient Survey Summary Rating3 out of [place] [phone]   [place] to my Favorites  Quality of Patient Care Rating4  out of 5 stars Patient Survey Summary Rating2 out of 5 stars  Viewing 1 - 18 of 18 results       Choose up to 3 home health agencies to compare. So far you have none selected.  Compare now  Modify your search  To change any of the search criteria in this panel and see the results, you must select the update results button after making your changes  Location  Home health agencies that serve: ZIP code or [place], [place] nameAgency name  Update search results  Filter by:  Clear all filters  Quality of patient care rating Learn more about these ratings - Opens in a new window  5 stars (1)  4 ? 4.5 stars (6)  3 ? 3.5 stars (8)  2 ? 2.5 stars (2)  1 ? 1.5 stars (0)  Patient survey summary ratings Learn more about these ratings - Opens in a new window   Rating: 5 out of 5 (0)   Rating: 4 out of 5 (9)   Rating: 3 out of 5 (5)   Rating: 2 out of 5 (2)   Rating: 1 out of 5 (0)  Want to start a new search?  Start new search  If footnotes appear in the table, tap the footnote number to see the footnote text. View more footnote details.  Back to top  Previous [place] website managed and paid for by the U.S. Centers for [employer] & Medicaid Services.   Sign Up / Change Plans  Your Medicare Costs  What Medicare Covers  Drug Coverage (Part D)  [place]  Take Action Find health & drug plans  Find doctors, providers, hospitals & plans  Where can I get covered medical items?  Get  Medicare forms  Publications  Information in other languages  Phone numbers & websites  Helpful Links Site Map  Site policies & important links  Public affairs consultant settings  Accessibility / Nondiscrimination  FOIA  No Fear Act  [place].Acupuncturist databases  "Medicare & You" Handbook  Help with file formats & plug-ins  CMS & HHS Websites PodExchange.nl  CriticalZ.it  MyMedicare.gov  Medicaid.gov  CMS.gov  HHS.gov  Get Involved with [place] Twitter link  for Medicare.gov twitter account opens a new tab  Youtube link for Medicare.gov Youtube channel opens a new tab  RSS link for Medicare.gov RSS feed  Sign up for email updates about Medicare

## 2018-08-07 NOTE — Progress Notes (Signed)
Patient discharge teaching given, including activity, diet, follow-up appoints, and medications. Patient verbalized understanding of all discharge instructions. IV access was d/c'd. Vitals are stable. Skin is intact except as charted in most recent assessments. Pt to be escorted out by volunteer, to be driven home by family.  Carrie [name]  

## 2018-08-07 NOTE — Progress Notes (Signed)
[facility]  ROUNDING NOTE   Subjective:   Sister at bedside.   Completed third hemodialysis treatment yesterday. Seated in a chair.   Objective:  Vital signs in last 24 hours:  Temp:  [98 F (36.7 C)-98.4 F (36.9 C)] 98 F (36.7 C) (03/05 0447) Pulse Rate:  [71-89] 71 (03/05 0447) Resp:  [9-20] 20 (03/05 0447) BP: (128-152)/(71-89) 129/79 (03/05 0447) SpO2:  [95 %-100 %] 95 % (03/05 0447) Weight:  [113.8 kg-114.3 kg] 113.8 kg (03/04 1500)  Weight change: -0.9 kg Filed Weights   08/06/18 0558 08/06/18 1200 08/06/18 1500  Weight: 114.3 kg 114.3 kg 113.8 kg    Intake/Output: I/O last 3 completed shifts: In: 720 [P.O.:720] Out: 760 [Urine:740; Other:20]   Intake/Output this shift:  No intake/output data recorded.  Physical Exam: General: NAD,   Head: Normocephalic, atraumatic. Moist oral mucosal membranes  Eyes: Anicteric, PERRL  Neck: Supple, trachea midline  Lungs:  Clear to auscultation  Heart: Regular rate and rhythm  Abdomen:  Soft, nontender,   Extremities: No bilateral  peripheral edema.  Neurologic: Nonfocal, moving all four extremities  Skin: No lesions  Access: RIJ permcath 3/2 Dr. Lucky Cowboy    Basic Metabolic Panel: Recent Labs  Lab 08/04/18 2047 08/05/18 0523 08/06/18 1422  NA 142 141 141  K 3.8 4.3 3.8  CL 105 103 101  CO2 29 27 28   GLUCOSE 94 108* 85  BUN 30* 36* 27*  CREATININE 2.95* 3.64* 3.71*  CALCIUM 7.8* 8.1* 8.6*  PHOS 3.1  --  4.6    Liver Function Tests: Recent Labs  Lab 08/04/18 2047 08/06/18 1422  ALBUMIN 3.6 3.5   No results for input(s): LIPASE, AMYLASE in the last 168 hours. No results for input(s): AMMONIA in the last 168 hours.  CBC: Recent Labs  Lab 08/05/18 0523 08/06/18 1422  WBC 6.7 6.9  HGB 7.9* 8.1*  HCT 25.5* 26.5*  MCV 87.6 88.0  PLT 225 241    Cardiac Enzymes: No results for input(s): CKTOTAL, CKMB, CKMBINDEX, TROPONINI in the last 168 hours.  BNP: Invalid input(s):  POCBNP  CBG: No results for input(s): GLUCAP in the last 168 hours.  Microbiology: Results for orders placed or performed during the hospital encounter of 04/30/17  MRSA PCR Screening     Status: None   Collection Time: 04/30/17  8:09 PM  Result Value Ref Range Status   MRSA by PCR NEGATIVE NEGATIVE Final    Comment:        The GeneXpert MRSA Assay (FDA approved for NASAL specimens only), is one component of a comprehensive MRSA colonization surveillance program. It is not intended to diagnose MRSA infection nor to guide or monitor treatment for MRSA infections.     Coagulation Studies: No results for input(s): LABPROT, INR in the last 72 hours.  Urinalysis: No results for input(s): COLORURINE, LABSPEC, PHURINE, GLUCOSEU, HGBUR, BILIRUBINUR, KETONESUR, PROTEINUR, UROBILINOGEN, NITRITE, LEUKOCYTESUR in the last 72 hours.  Invalid input(s): APPERANCEUR    Imaging: No results found.   Medications:   . sodium chloride     . amLODipine  10 mg Oral Daily  . atorvastatin  40 mg Oral q1800  . calcitRIOL  0.25 mcg Oral Daily  . Chlorhexidine Gluconate Cloth  6 each Topical Q0600  . feeding supplement (ENSURE ENLIVE)  237 mL Oral Q24H  . heparin  5,000 Units Subcutaneous Q8H  . levETIRAcetam  1,000 mg Oral BID  . [START ON 08/08/2018] levETIRAcetam  500 mg Oral Once per  day on Mon Wed Fri  . losartan  50 mg Oral Daily  . metoprolol succinate  25 mg Oral Daily  . multivitamin  1 tablet Oral QHS  . sodium bicarbonate  1,300 mg Oral BID  . sodium chloride flush  3 mL Intravenous Q12H  . torsemide  10 mg Oral Daily   sodium chloride, acetaminophen **OR** acetaminophen, HYDROcodone-acetaminophen, ondansetron **OR** ondansetron (ZOFRAN) IV, polyethylene glycol, sodium chloride flush  Assessment/ Plan:  [name] is a 68 y.o. black female with chronic kidney disease stage V, hypertension, migraine headache disorder, allergic rhinitis, diabetes mellitus type II,  history of meningioma, osteoarthritis, anxiety, hyperlipidemia, diastolic congestive heart failure  1. End Stage Renal Disease: first hemodialysis 3/2.  - Pending quantiferon gold  - Outpatient planning for [facility] TTS schedule.   - Dialysis for today, orders prepared.   2. Hypertension with edema:  Home regimen of losartan, torsemide, and amlodipine.   3. Diabetes Mellitus type II with chronic kidney disease: Currently diet controlled.   4. Secondary Hyperparathyroidism with Vitamin D deficiency: Currently on calcitriol 0.25 mcg    5. Anemia with chronic kidney disease:   - EPO with HD treatment   6. Seizure disorder - Add levetiracetam dose after dialysis   LOS: 3 Carrie [name] 3/5/20207:54 AM

## 2018-08-07 NOTE — Progress Notes (Signed)
   08/07/18 1115  Vital Signs  Pulse Rate Source Monitor  BP Location Right Arm  BP Method Automatic  Patient Position (if appropriate) Lying  During Hemodialysis Assessment  Blood Flow Rate (mL/min) 400 mL/min  Arterial Pressure (mmHg) -180 mmHg  Venous Pressure (mmHg) 130 mmHg  Transmembrane Pressure (mmHg) 40 mmHg  Ultrafiltration Rate (mL/min) 340 mL/min  Dialysate Flow Rate (mL/min) 600 ml/min  Conductivity: Machine  14  HD Safety Checks Performed Yes  Intra-Hemodialysis Comments Progressing as prescribed   Pt sleeping comfortably , no obvious signs of distress, VS WNL.

## 2018-08-07 NOTE — Progress Notes (Signed)
   08/07/18 0947  Vital Signs  Pulse Rate 68  Pulse Rate Source Monitor  Resp 13  BP 121/71  BP Location Right Arm  BP Method Automatic  Patient Position (if appropriate) Lying  During Hemodialysis Assessment  Blood Flow Rate (mL/min) 400 mL/min  Arterial Pressure (mmHg) -180 mmHg  Venous Pressure (mmHg) 130 mmHg  Transmembrane Pressure (mmHg) 40 mmHg  Ultrafiltration Rate (mL/min) 330 mL/min  Dialysate Flow Rate (mL/min) 600 ml/min  Conductivity: Machine  13.9  HD Safety Checks Performed Yes  Dialysis Fluid Bolus Normal Saline  Bolus Amount (mL) 250 mL  Intra-Hemodialysis Comments Tx initiated   Pt. Stable, has no c/o, not in obvious distress, tx started without problems.

## 2018-08-07 NOTE — Progress Notes (Signed)
   08/07/18 0940  Neurological  Level of Consciousness Alert  Orientation Level Oriented X4  Respiratory  Respiratory Pattern Regular;Unlabored  Chest Assessment Chest expansion symmetrical  Bilateral Breath Sounds Diminished;Clear  R Upper  Breath Sounds Clear  L Upper Breath Sounds Clear  R Lower Breath Sounds Diminished  L Lower Breath Sounds Diminished  Cough Non-productive  Cardiac  Pulse Regular  Heart Sounds S1, S2  Vascular  R Radial Pulse +2  L Radial Pulse +2  Musculoskeletal  Musculoskeletal (WDL) WDL  GU Assessment  Genitourinary (WDL) WDL  Psychosocial  Psychosocial (WDL) WDL   Pre dialysis assessment

## 2018-08-08 ENCOUNTER — Telehealth: Payer: Self-pay

## 2018-08-08 NOTE — Telephone Encounter (Signed)
Transition Care Management Follow-up Telephone Call   Date discharged? 08/07/2018   How have you been since you were released from the hospital? Doing good. No issues or concerns. Started dialysis. She had hospital bed delivered to her now and she is happy with this.   Do you understand why you were in the hospital? yes   Do you understand the discharge instructions?yes   Where were you discharged to? home   Items Reviewed:  Medications reviewed: yes  Allergies reviewed: yes  Dietary changes reviewed: yes  Referrals reviewed: yes   Functional Questionnaire:   Activities of Daily Living (ADLs):   She states they are independent in the following: ambulation, grooming, dressing, bathing, toileting, hygiene. States they require assistance with the following: none at this time.   Any transportation issues/concerns?: no   Any patient concerns? no   Confirmed importance and date/time of follow-up visits scheduled yes  Provider Appointment booked with Dr. Silvio Pate on 08/18/2018  Confirmed with patient if condition begins to worsen call PCP or go to the ER.  Patient was given the office number and encouraged to call back with question or concerns.  : yes

## 2018-08-11 DIAGNOSIS — I5032 Chronic diastolic (congestive) heart failure: Secondary | ICD-10-CM | POA: Diagnosis not present

## 2018-08-11 DIAGNOSIS — E785 Hyperlipidemia, unspecified: Secondary | ICD-10-CM | POA: Diagnosis not present

## 2018-08-11 DIAGNOSIS — N186 End stage renal disease: Secondary | ICD-10-CM | POA: Diagnosis not present

## 2018-08-11 DIAGNOSIS — I132 Hypertensive heart and chronic kidney disease with heart failure and with stage 5 chronic kidney disease, or end stage renal disease: Secondary | ICD-10-CM | POA: Diagnosis not present

## 2018-08-11 DIAGNOSIS — E1122 Type 2 diabetes mellitus with diabetic chronic kidney disease: Secondary | ICD-10-CM | POA: Diagnosis not present

## 2018-08-11 DIAGNOSIS — F329 Major depressive disorder, single episode, unspecified: Secondary | ICD-10-CM | POA: Diagnosis not present

## 2018-08-11 DIAGNOSIS — R569 Unspecified convulsions: Secondary | ICD-10-CM | POA: Diagnosis not present

## 2018-08-11 DIAGNOSIS — D631 Anemia in chronic kidney disease: Secondary | ICD-10-CM | POA: Diagnosis not present

## 2018-08-11 DIAGNOSIS — M1711 Unilateral primary osteoarthritis, right knee: Secondary | ICD-10-CM | POA: Diagnosis not present

## 2018-08-12 DIAGNOSIS — Z992 Dependence on renal dialysis: Secondary | ICD-10-CM | POA: Diagnosis not present

## 2018-08-12 DIAGNOSIS — N186 End stage renal disease: Secondary | ICD-10-CM | POA: Diagnosis not present

## 2018-08-12 DIAGNOSIS — E119 Type 2 diabetes mellitus without complications: Secondary | ICD-10-CM | POA: Diagnosis not present

## 2018-08-14 DIAGNOSIS — N186 End stage renal disease: Secondary | ICD-10-CM | POA: Diagnosis not present

## 2018-08-14 DIAGNOSIS — Z992 Dependence on renal dialysis: Secondary | ICD-10-CM | POA: Diagnosis not present

## 2018-08-15 ENCOUNTER — Encounter (INDEPENDENT_AMBULATORY_CARE_PROVIDER_SITE_OTHER): Payer: Self-pay

## 2018-08-16 DIAGNOSIS — Z992 Dependence on renal dialysis: Secondary | ICD-10-CM | POA: Diagnosis not present

## 2018-08-16 DIAGNOSIS — N186 End stage renal disease: Secondary | ICD-10-CM | POA: Diagnosis not present

## 2018-08-18 ENCOUNTER — Inpatient Hospital Stay: Payer: Medicare HMO | Admitting: Internal Medicine

## 2018-08-19 ENCOUNTER — Other Ambulatory Visit: Payer: Self-pay | Admitting: Internal Medicine

## 2018-08-19 DIAGNOSIS — N186 End stage renal disease: Secondary | ICD-10-CM | POA: Diagnosis not present

## 2018-08-19 DIAGNOSIS — Z992 Dependence on renal dialysis: Secondary | ICD-10-CM | POA: Diagnosis not present

## 2018-08-19 NOTE — Telephone Encounter (Signed)
Last filled 06-23-18 #60 Last OV 06-23-18 Next OV 12-24-18 [facility]

## 2018-08-20 ENCOUNTER — Telehealth (INDEPENDENT_AMBULATORY_CARE_PROVIDER_SITE_OTHER): Payer: Self-pay | Admitting: Vascular Surgery

## 2018-08-20 DIAGNOSIS — F329 Major depressive disorder, single episode, unspecified: Secondary | ICD-10-CM | POA: Diagnosis not present

## 2018-08-20 DIAGNOSIS — R569 Unspecified convulsions: Secondary | ICD-10-CM | POA: Diagnosis not present

## 2018-08-20 DIAGNOSIS — I132 Hypertensive heart and chronic kidney disease with heart failure and with stage 5 chronic kidney disease, or end stage renal disease: Secondary | ICD-10-CM | POA: Diagnosis not present

## 2018-08-20 DIAGNOSIS — I5032 Chronic diastolic (congestive) heart failure: Secondary | ICD-10-CM | POA: Diagnosis not present

## 2018-08-20 DIAGNOSIS — N186 End stage renal disease: Secondary | ICD-10-CM | POA: Diagnosis not present

## 2018-08-20 DIAGNOSIS — E785 Hyperlipidemia, unspecified: Secondary | ICD-10-CM | POA: Diagnosis not present

## 2018-08-20 DIAGNOSIS — D631 Anemia in chronic kidney disease: Secondary | ICD-10-CM | POA: Diagnosis not present

## 2018-08-20 DIAGNOSIS — M1711 Unilateral primary osteoarthritis, right knee: Secondary | ICD-10-CM | POA: Diagnosis not present

## 2018-08-20 DIAGNOSIS — E1122 Type 2 diabetes mellitus with diabetic chronic kidney disease: Secondary | ICD-10-CM | POA: Diagnosis not present

## 2018-08-20 NOTE — Telephone Encounter (Signed)
I called patient to let her know that surgery was canceled at this time per Mickel Baas due to [place]. Patient is aware that we will not be schedule surgerys until after 10/06/18. Patient verbalized understanding. AS, CMA

## 2018-08-21 DIAGNOSIS — N186 End stage renal disease: Secondary | ICD-10-CM | POA: Diagnosis not present

## 2018-08-21 DIAGNOSIS — Z992 Dependence on renal dialysis: Secondary | ICD-10-CM | POA: Diagnosis not present

## 2018-08-22 ENCOUNTER — Inpatient Hospital Stay: Admission: RE | Admit: 2018-08-22 | Payer: Medicare HMO | Source: Ambulatory Visit

## 2018-08-22 DIAGNOSIS — I132 Hypertensive heart and chronic kidney disease with heart failure and with stage 5 chronic kidney disease, or end stage renal disease: Secondary | ICD-10-CM | POA: Diagnosis not present

## 2018-08-22 DIAGNOSIS — R569 Unspecified convulsions: Secondary | ICD-10-CM | POA: Diagnosis not present

## 2018-08-22 DIAGNOSIS — I5032 Chronic diastolic (congestive) heart failure: Secondary | ICD-10-CM | POA: Diagnosis not present

## 2018-08-22 DIAGNOSIS — E785 Hyperlipidemia, unspecified: Secondary | ICD-10-CM | POA: Diagnosis not present

## 2018-08-22 DIAGNOSIS — M1711 Unilateral primary osteoarthritis, right knee: Secondary | ICD-10-CM | POA: Diagnosis not present

## 2018-08-22 DIAGNOSIS — F329 Major depressive disorder, single episode, unspecified: Secondary | ICD-10-CM | POA: Diagnosis not present

## 2018-08-22 DIAGNOSIS — N186 End stage renal disease: Secondary | ICD-10-CM | POA: Diagnosis not present

## 2018-08-22 DIAGNOSIS — E1122 Type 2 diabetes mellitus with diabetic chronic kidney disease: Secondary | ICD-10-CM | POA: Diagnosis not present

## 2018-08-22 DIAGNOSIS — D631 Anemia in chronic kidney disease: Secondary | ICD-10-CM | POA: Diagnosis not present

## 2018-08-23 DIAGNOSIS — Z992 Dependence on renal dialysis: Secondary | ICD-10-CM | POA: Diagnosis not present

## 2018-08-23 DIAGNOSIS — N186 End stage renal disease: Secondary | ICD-10-CM | POA: Diagnosis not present

## 2018-08-26 DIAGNOSIS — Z992 Dependence on renal dialysis: Secondary | ICD-10-CM | POA: Diagnosis not present

## 2018-08-26 DIAGNOSIS — N186 End stage renal disease: Secondary | ICD-10-CM | POA: Diagnosis not present

## 2018-08-27 DIAGNOSIS — E1122 Type 2 diabetes mellitus with diabetic chronic kidney disease: Secondary | ICD-10-CM | POA: Diagnosis not present

## 2018-08-27 DIAGNOSIS — R569 Unspecified convulsions: Secondary | ICD-10-CM | POA: Diagnosis not present

## 2018-08-27 DIAGNOSIS — M1711 Unilateral primary osteoarthritis, right knee: Secondary | ICD-10-CM | POA: Diagnosis not present

## 2018-08-27 DIAGNOSIS — E785 Hyperlipidemia, unspecified: Secondary | ICD-10-CM | POA: Diagnosis not present

## 2018-08-27 DIAGNOSIS — I5032 Chronic diastolic (congestive) heart failure: Secondary | ICD-10-CM | POA: Diagnosis not present

## 2018-08-27 DIAGNOSIS — F329 Major depressive disorder, single episode, unspecified: Secondary | ICD-10-CM | POA: Diagnosis not present

## 2018-08-27 DIAGNOSIS — N186 End stage renal disease: Secondary | ICD-10-CM | POA: Diagnosis not present

## 2018-08-27 DIAGNOSIS — D631 Anemia in chronic kidney disease: Secondary | ICD-10-CM | POA: Diagnosis not present

## 2018-08-27 DIAGNOSIS — I132 Hypertensive heart and chronic kidney disease with heart failure and with stage 5 chronic kidney disease, or end stage renal disease: Secondary | ICD-10-CM | POA: Diagnosis not present

## 2018-08-28 DIAGNOSIS — Z992 Dependence on renal dialysis: Secondary | ICD-10-CM | POA: Diagnosis not present

## 2018-08-28 DIAGNOSIS — N186 End stage renal disease: Secondary | ICD-10-CM | POA: Diagnosis not present

## 2018-08-29 DIAGNOSIS — R569 Unspecified convulsions: Secondary | ICD-10-CM | POA: Diagnosis not present

## 2018-08-29 DIAGNOSIS — N186 End stage renal disease: Secondary | ICD-10-CM | POA: Diagnosis not present

## 2018-08-29 DIAGNOSIS — Z992 Dependence on renal dialysis: Secondary | ICD-10-CM

## 2018-08-29 DIAGNOSIS — F329 Major depressive disorder, single episode, unspecified: Secondary | ICD-10-CM

## 2018-08-29 DIAGNOSIS — E1122 Type 2 diabetes mellitus with diabetic chronic kidney disease: Secondary | ICD-10-CM | POA: Diagnosis not present

## 2018-08-29 DIAGNOSIS — Z9181 History of falling: Secondary | ICD-10-CM

## 2018-08-29 DIAGNOSIS — D631 Anemia in chronic kidney disease: Secondary | ICD-10-CM | POA: Diagnosis not present

## 2018-08-29 DIAGNOSIS — I5032 Chronic diastolic (congestive) heart failure: Secondary | ICD-10-CM | POA: Diagnosis not present

## 2018-08-29 DIAGNOSIS — M1711 Unilateral primary osteoarthritis, right knee: Secondary | ICD-10-CM

## 2018-08-29 DIAGNOSIS — E785 Hyperlipidemia, unspecified: Secondary | ICD-10-CM

## 2018-08-29 DIAGNOSIS — I132 Hypertensive heart and chronic kidney disease with heart failure and with stage 5 chronic kidney disease, or end stage renal disease: Secondary | ICD-10-CM | POA: Diagnosis not present

## 2018-08-30 DIAGNOSIS — Z992 Dependence on renal dialysis: Secondary | ICD-10-CM | POA: Diagnosis not present

## 2018-08-30 DIAGNOSIS — N186 End stage renal disease: Secondary | ICD-10-CM | POA: Diagnosis not present

## 2018-09-02 DIAGNOSIS — Z992 Dependence on renal dialysis: Secondary | ICD-10-CM | POA: Diagnosis not present

## 2018-09-02 DIAGNOSIS — N186 End stage renal disease: Secondary | ICD-10-CM | POA: Diagnosis not present

## 2018-09-03 ENCOUNTER — Encounter (INDEPENDENT_AMBULATORY_CARE_PROVIDER_SITE_OTHER): Payer: Self-pay

## 2018-09-03 DIAGNOSIS — E1122 Type 2 diabetes mellitus with diabetic chronic kidney disease: Secondary | ICD-10-CM | POA: Diagnosis not present

## 2018-09-03 DIAGNOSIS — D631 Anemia in chronic kidney disease: Secondary | ICD-10-CM | POA: Diagnosis not present

## 2018-09-03 DIAGNOSIS — N186 End stage renal disease: Secondary | ICD-10-CM | POA: Diagnosis not present

## 2018-09-03 DIAGNOSIS — R569 Unspecified convulsions: Secondary | ICD-10-CM | POA: Diagnosis not present

## 2018-09-03 DIAGNOSIS — I5032 Chronic diastolic (congestive) heart failure: Secondary | ICD-10-CM | POA: Diagnosis not present

## 2018-09-03 DIAGNOSIS — F329 Major depressive disorder, single episode, unspecified: Secondary | ICD-10-CM | POA: Diagnosis not present

## 2018-09-03 DIAGNOSIS — E785 Hyperlipidemia, unspecified: Secondary | ICD-10-CM | POA: Diagnosis not present

## 2018-09-03 DIAGNOSIS — I132 Hypertensive heart and chronic kidney disease with heart failure and with stage 5 chronic kidney disease, or end stage renal disease: Secondary | ICD-10-CM | POA: Diagnosis not present

## 2018-09-03 DIAGNOSIS — M1711 Unilateral primary osteoarthritis, right knee: Secondary | ICD-10-CM | POA: Diagnosis not present

## 2018-09-03 NOTE — Telephone Encounter (Signed)
Patient has been rescheduled for her pre-op and surgery and this information will be mailed out to the patient.

## 2018-09-04 DIAGNOSIS — N186 End stage renal disease: Secondary | ICD-10-CM | POA: Diagnosis not present

## 2018-09-04 DIAGNOSIS — Z23 Encounter for immunization: Secondary | ICD-10-CM | POA: Diagnosis not present

## 2018-09-04 DIAGNOSIS — Z992 Dependence on renal dialysis: Secondary | ICD-10-CM | POA: Diagnosis not present

## 2018-09-05 DIAGNOSIS — D631 Anemia in chronic kidney disease: Secondary | ICD-10-CM | POA: Diagnosis not present

## 2018-09-05 DIAGNOSIS — E785 Hyperlipidemia, unspecified: Secondary | ICD-10-CM | POA: Diagnosis not present

## 2018-09-05 DIAGNOSIS — F329 Major depressive disorder, single episode, unspecified: Secondary | ICD-10-CM | POA: Diagnosis not present

## 2018-09-05 DIAGNOSIS — I132 Hypertensive heart and chronic kidney disease with heart failure and with stage 5 chronic kidney disease, or end stage renal disease: Secondary | ICD-10-CM | POA: Diagnosis not present

## 2018-09-05 DIAGNOSIS — N186 End stage renal disease: Secondary | ICD-10-CM | POA: Diagnosis not present

## 2018-09-05 DIAGNOSIS — I5032 Chronic diastolic (congestive) heart failure: Secondary | ICD-10-CM | POA: Diagnosis not present

## 2018-09-05 DIAGNOSIS — E1122 Type 2 diabetes mellitus with diabetic chronic kidney disease: Secondary | ICD-10-CM | POA: Diagnosis not present

## 2018-09-05 DIAGNOSIS — R569 Unspecified convulsions: Secondary | ICD-10-CM | POA: Diagnosis not present

## 2018-09-05 DIAGNOSIS — M1711 Unilateral primary osteoarthritis, right knee: Secondary | ICD-10-CM | POA: Diagnosis not present

## 2018-09-06 DIAGNOSIS — Z992 Dependence on renal dialysis: Secondary | ICD-10-CM | POA: Diagnosis not present

## 2018-09-06 DIAGNOSIS — N186 End stage renal disease: Secondary | ICD-10-CM | POA: Diagnosis not present

## 2018-09-06 DIAGNOSIS — Z23 Encounter for immunization: Secondary | ICD-10-CM | POA: Diagnosis not present

## 2018-09-07 DIAGNOSIS — I5032 Chronic diastolic (congestive) heart failure: Secondary | ICD-10-CM | POA: Diagnosis not present

## 2018-09-09 DIAGNOSIS — N186 End stage renal disease: Secondary | ICD-10-CM | POA: Diagnosis not present

## 2018-09-09 DIAGNOSIS — Z23 Encounter for immunization: Secondary | ICD-10-CM | POA: Diagnosis not present

## 2018-09-09 DIAGNOSIS — Z992 Dependence on renal dialysis: Secondary | ICD-10-CM | POA: Diagnosis not present

## 2018-09-10 DIAGNOSIS — E785 Hyperlipidemia, unspecified: Secondary | ICD-10-CM | POA: Diagnosis not present

## 2018-09-10 DIAGNOSIS — M1711 Unilateral primary osteoarthritis, right knee: Secondary | ICD-10-CM | POA: Diagnosis not present

## 2018-09-10 DIAGNOSIS — D631 Anemia in chronic kidney disease: Secondary | ICD-10-CM | POA: Diagnosis not present

## 2018-09-10 DIAGNOSIS — F329 Major depressive disorder, single episode, unspecified: Secondary | ICD-10-CM | POA: Diagnosis not present

## 2018-09-10 DIAGNOSIS — I5032 Chronic diastolic (congestive) heart failure: Secondary | ICD-10-CM | POA: Diagnosis not present

## 2018-09-10 DIAGNOSIS — R569 Unspecified convulsions: Secondary | ICD-10-CM | POA: Diagnosis not present

## 2018-09-10 DIAGNOSIS — N186 End stage renal disease: Secondary | ICD-10-CM | POA: Diagnosis not present

## 2018-09-10 DIAGNOSIS — E1122 Type 2 diabetes mellitus with diabetic chronic kidney disease: Secondary | ICD-10-CM | POA: Diagnosis not present

## 2018-09-10 DIAGNOSIS — I132 Hypertensive heart and chronic kidney disease with heart failure and with stage 5 chronic kidney disease, or end stage renal disease: Secondary | ICD-10-CM | POA: Diagnosis not present

## 2018-09-11 DIAGNOSIS — Z992 Dependence on renal dialysis: Secondary | ICD-10-CM | POA: Diagnosis not present

## 2018-09-11 DIAGNOSIS — Z23 Encounter for immunization: Secondary | ICD-10-CM | POA: Diagnosis not present

## 2018-09-11 DIAGNOSIS — N186 End stage renal disease: Secondary | ICD-10-CM | POA: Diagnosis not present

## 2018-09-13 DIAGNOSIS — N186 End stage renal disease: Secondary | ICD-10-CM | POA: Diagnosis not present

## 2018-09-13 DIAGNOSIS — Z992 Dependence on renal dialysis: Secondary | ICD-10-CM | POA: Diagnosis not present

## 2018-09-13 DIAGNOSIS — Z23 Encounter for immunization: Secondary | ICD-10-CM | POA: Diagnosis not present

## 2018-09-16 ENCOUNTER — Telehealth: Payer: Self-pay | Admitting: Internal Medicine

## 2018-09-16 DIAGNOSIS — Z23 Encounter for immunization: Secondary | ICD-10-CM | POA: Diagnosis not present

## 2018-09-16 DIAGNOSIS — Z992 Dependence on renal dialysis: Secondary | ICD-10-CM | POA: Diagnosis not present

## 2018-09-16 DIAGNOSIS — N186 End stage renal disease: Secondary | ICD-10-CM | POA: Diagnosis not present

## 2018-09-16 NOTE — Telephone Encounter (Signed)
Best number [phone] Fax [phone]  Angie @ [facility] called needing pt immunization records

## 2018-09-16 NOTE — Telephone Encounter (Signed)
Faxed record to Angie's attention

## 2018-09-17 DIAGNOSIS — F329 Major depressive disorder, single episode, unspecified: Secondary | ICD-10-CM | POA: Diagnosis not present

## 2018-09-17 DIAGNOSIS — N186 End stage renal disease: Secondary | ICD-10-CM | POA: Diagnosis not present

## 2018-09-17 DIAGNOSIS — E785 Hyperlipidemia, unspecified: Secondary | ICD-10-CM | POA: Diagnosis not present

## 2018-09-17 DIAGNOSIS — D631 Anemia in chronic kidney disease: Secondary | ICD-10-CM | POA: Diagnosis not present

## 2018-09-17 DIAGNOSIS — E1122 Type 2 diabetes mellitus with diabetic chronic kidney disease: Secondary | ICD-10-CM | POA: Diagnosis not present

## 2018-09-17 DIAGNOSIS — I132 Hypertensive heart and chronic kidney disease with heart failure and with stage 5 chronic kidney disease, or end stage renal disease: Secondary | ICD-10-CM | POA: Diagnosis not present

## 2018-09-17 DIAGNOSIS — R569 Unspecified convulsions: Secondary | ICD-10-CM | POA: Diagnosis not present

## 2018-09-17 DIAGNOSIS — I5032 Chronic diastolic (congestive) heart failure: Secondary | ICD-10-CM | POA: Diagnosis not present

## 2018-09-17 DIAGNOSIS — M1711 Unilateral primary osteoarthritis, right knee: Secondary | ICD-10-CM | POA: Diagnosis not present

## 2018-09-18 DIAGNOSIS — Z23 Encounter for immunization: Secondary | ICD-10-CM | POA: Diagnosis not present

## 2018-09-18 DIAGNOSIS — N186 End stage renal disease: Secondary | ICD-10-CM | POA: Diagnosis not present

## 2018-09-18 DIAGNOSIS — Z992 Dependence on renal dialysis: Secondary | ICD-10-CM | POA: Diagnosis not present

## 2018-09-20 DIAGNOSIS — Z23 Encounter for immunization: Secondary | ICD-10-CM | POA: Diagnosis not present

## 2018-09-20 DIAGNOSIS — Z992 Dependence on renal dialysis: Secondary | ICD-10-CM | POA: Diagnosis not present

## 2018-09-20 DIAGNOSIS — N186 End stage renal disease: Secondary | ICD-10-CM | POA: Diagnosis not present

## 2018-09-22 ENCOUNTER — Telehealth: Payer: Self-pay

## 2018-09-22 NOTE — Telephone Encounter (Signed)
Virtual Visit Pre-Appointment Phone Call  Steps For Call:  1. Confirm consent - "In the setting of the current Covid19 crisis, you are scheduled for a video visit with your provider on Oct 21, 2018 at 2:40PM.  Just as we do with many in-office visits, in order for you to participate in this visit, we must obtain consent.  If you'd like, I can send this to your mychart (if signed up) or email for you to review.  Otherwise, I can obtain your verbal consent now.  All virtual visits are billed to your insurance company just like a normal visit would be.  By agreeing to a virtual visit, we'd like you to understand that the technology does not allow for your provider to perform an examination, and thus may limit your provider's ability to fully assess your condition. If your provider identifies any concerns that need to be evaluated in person, we will make arrangements to do so.  Finally, though the technology is pretty good, we cannot assure that it will always work on either your or our end, and in the setting of a video visit, we may have to convert it to a phone-only visit.  In either situation, we cannot ensure that we have a secure connection.  Are you willing to proceed?" STAFF: Did the patient verbally acknowledge consent to telehealth visit? Document YES/NO here: YES  2. Confirm the BEST phone number to call the day of the visit by including in appointment notes  3. Give patient instructions for WebEx/MyChart download to smartphone as below or Doximity/Doxy.me if video visit (depending on what platform provider is using)  4. Advise patient to be prepared with their blood pressure, heart rate, weight, any heart rhythm information, their current medicines, and a piece of paper and pen handy for any instructions they may receive the day of their visit  5. Inform patient they will receive a phone call 15 minutes prior to their appointment time (may be from unknown caller ID) so they should be  prepared to answer  6. Confirm that appointment type is correct in Epic appointment notes (VIDEO vs PHONE)     TELEPHONE CALL NOTE  Carrie [name] has been deemed a candidate for a follow-up tele-health visit to limit community exposure during the Covid-19 pandemic. I spoke with the patient via phone to ensure availability of phone/video source, confirm preferred email & phone number, and discuss instructions and expectations.  I reminded Petula D Paganelli to be prepared with any vital sign and/or heart rhythm information that could potentially be obtained via home monitoring, at the time of her visit. I reminded Semya Klinke Raus to expect a phone call at the time of her visit if her visit.  Rene Paci McClain 09/22/2018 10:29 AM   INSTRUCTIONS FOR DOWNLOADING THE WEBEX APP TO SMARTPHONE  - If Apple, ask patient to go to [employer] and type in WebEx in the search bar. [employer], the blue/green circle. If Android, go to [employer] and type in [employer] in the search bar. The app is free but as with any other app downloads, their phone may require them to verify saved payment information or Apple/Android password.  - The patient does NOT have to create an account. - On the day of the visit, the assist will walk the patient through joining the meeting with the meeting number/password.  INSTRUCTIONS FOR DOWNLOADING THE MYCHART APP TO SMARTPHONE  - The patient must first  make sure to have activated MyChart and know their login information - If Apple, go to [employer] and type in MyChart in the search bar and download the app. If Android, ask patient to go to [employer] and type in [place] in the search bar and download the app. The app is free but as with any other app downloads, their phone may require them to verify saved payment information or Apple/Android password.  - The patient will need to then log into the app with their MyChart username and password,  and select [facility] as their healthcare provider to link the account. When it is time for your visit, go to the MyChart app, find appointments, and click Begin Video Visit. Be sure to Select Allow for your device to access the Microphone and Camera for your visit. You will then be connected, and your provider will be with you shortly.  **If they have any issues connecting, or need assistance please contact MyChart service desk [phone])**  **If using a computer, in order to ensure the best quality for their visit they will need to use either of the following Internet Browsers: [employer], or Google Chrome**  IF USING DOXIMITY or DOXY.ME - The patient will receive a link just prior to their visit, either by text or email (to be determined day of appointment depending on if it's doxy.me or Doximity).     FULL LENGTH CONSENT FOR TELE-HEALTH VISIT   I hereby voluntarily request, consent and authorize [facility] and its employed or contracted physicians, physician assistants, nurse practitioners or other licensed health care professionals (the Practitioner), to provide me with telemedicine health care services (the "Services") as deemed necessary by the treating Practitioner. I acknowledge and consent to receive the Services by the Practitioner via telemedicine. I understand that the telemedicine visit will involve communicating with the Practitioner through live audiovisual communication technology and the disclosure of certain medical information by electronic transmission. I acknowledge that I have been given the opportunity to request an in-person assessment or other available alternative prior to the telemedicine visit and am voluntarily participating in the telemedicine visit.  I understand that I have the right to withhold or withdraw my consent to the use of telemedicine in the course of my care at any time, without affecting my right to future care or treatment, and  that the Practitioner or I may terminate the telemedicine visit at any time. I understand that I have the right to inspect all information obtained and/or recorded in the course of the telemedicine visit and may receive copies of available information for a reasonable fee.  I understand that some of the potential risks of receiving the Services via telemedicine include:  [name] Delay or interruption in medical evaluation due to technological equipment failure or disruption; . Information transmitted may not be sufficient (e.g. poor resolution of images) to allow for appropriate medical decision making by the Practitioner; and/or  . In rare instances, security protocols could fail, causing a breach of personal health information.  Furthermore, I acknowledge that it is my responsibility to provide information about my medical history, conditions and care that is complete and accurate to the best of my ability. I acknowledge that Practitioner's advice, recommendations, and/or decision may be based on factors not within their control, such as incomplete or inaccurate data provided by me or distortions of diagnostic images or specimens that may result from electronic transmissions. I understand that the practice of medicine is not an  exact science and that Practitioner makes no warranties or guarantees regarding treatment outcomes. I acknowledge that I will receive a copy of this consent concurrently upon execution via email to the email address I last provided but may also request a printed copy by calling the office of [place].    I understand that my insurance will be billed for this visit.   I have read or had this consent read to me. . I understand the contents of this consent, which adequately explains the benefits and risks of the Services being provided via telemedicine.  . I have been provided ample opportunity to ask questions regarding this consent and the Services and have had my questions  answered to my satisfaction. . I give my informed consent for the services to be provided through the use of telemedicine in my medical care  By participating in this telemedicine visit I agree to the above.

## 2018-09-23 DIAGNOSIS — N186 End stage renal disease: Secondary | ICD-10-CM | POA: Diagnosis not present

## 2018-09-23 DIAGNOSIS — Z992 Dependence on renal dialysis: Secondary | ICD-10-CM | POA: Diagnosis not present

## 2018-09-23 DIAGNOSIS — Z23 Encounter for immunization: Secondary | ICD-10-CM | POA: Diagnosis not present

## 2018-09-24 DIAGNOSIS — E785 Hyperlipidemia, unspecified: Secondary | ICD-10-CM | POA: Diagnosis not present

## 2018-09-24 DIAGNOSIS — I132 Hypertensive heart and chronic kidney disease with heart failure and with stage 5 chronic kidney disease, or end stage renal disease: Secondary | ICD-10-CM | POA: Diagnosis not present

## 2018-09-24 DIAGNOSIS — F329 Major depressive disorder, single episode, unspecified: Secondary | ICD-10-CM | POA: Diagnosis not present

## 2018-09-24 DIAGNOSIS — I5032 Chronic diastolic (congestive) heart failure: Secondary | ICD-10-CM | POA: Diagnosis not present

## 2018-09-24 DIAGNOSIS — E1122 Type 2 diabetes mellitus with diabetic chronic kidney disease: Secondary | ICD-10-CM | POA: Diagnosis not present

## 2018-09-24 DIAGNOSIS — M1711 Unilateral primary osteoarthritis, right knee: Secondary | ICD-10-CM | POA: Diagnosis not present

## 2018-09-24 DIAGNOSIS — N186 End stage renal disease: Secondary | ICD-10-CM | POA: Diagnosis not present

## 2018-09-24 DIAGNOSIS — R569 Unspecified convulsions: Secondary | ICD-10-CM | POA: Diagnosis not present

## 2018-09-24 DIAGNOSIS — D631 Anemia in chronic kidney disease: Secondary | ICD-10-CM | POA: Diagnosis not present

## 2018-09-25 DIAGNOSIS — Z23 Encounter for immunization: Secondary | ICD-10-CM | POA: Diagnosis not present

## 2018-09-25 DIAGNOSIS — N186 End stage renal disease: Secondary | ICD-10-CM | POA: Diagnosis not present

## 2018-09-25 DIAGNOSIS — Z992 Dependence on renal dialysis: Secondary | ICD-10-CM | POA: Diagnosis not present

## 2018-09-27 DIAGNOSIS — N186 End stage renal disease: Secondary | ICD-10-CM | POA: Diagnosis not present

## 2018-09-27 DIAGNOSIS — Z992 Dependence on renal dialysis: Secondary | ICD-10-CM | POA: Diagnosis not present

## 2018-09-27 DIAGNOSIS — Z23 Encounter for immunization: Secondary | ICD-10-CM | POA: Diagnosis not present

## 2018-09-30 DIAGNOSIS — Z992 Dependence on renal dialysis: Secondary | ICD-10-CM | POA: Diagnosis not present

## 2018-09-30 DIAGNOSIS — E119 Type 2 diabetes mellitus without complications: Secondary | ICD-10-CM | POA: Diagnosis not present

## 2018-09-30 DIAGNOSIS — Z23 Encounter for immunization: Secondary | ICD-10-CM | POA: Diagnosis not present

## 2018-09-30 DIAGNOSIS — N186 End stage renal disease: Secondary | ICD-10-CM | POA: Diagnosis not present

## 2018-10-01 DIAGNOSIS — E785 Hyperlipidemia, unspecified: Secondary | ICD-10-CM | POA: Diagnosis not present

## 2018-10-01 DIAGNOSIS — N186 End stage renal disease: Secondary | ICD-10-CM | POA: Diagnosis not present

## 2018-10-01 DIAGNOSIS — D631 Anemia in chronic kidney disease: Secondary | ICD-10-CM | POA: Diagnosis not present

## 2018-10-01 DIAGNOSIS — R569 Unspecified convulsions: Secondary | ICD-10-CM | POA: Diagnosis not present

## 2018-10-01 DIAGNOSIS — F329 Major depressive disorder, single episode, unspecified: Secondary | ICD-10-CM | POA: Diagnosis not present

## 2018-10-01 DIAGNOSIS — M1711 Unilateral primary osteoarthritis, right knee: Secondary | ICD-10-CM | POA: Diagnosis not present

## 2018-10-01 DIAGNOSIS — E1122 Type 2 diabetes mellitus with diabetic chronic kidney disease: Secondary | ICD-10-CM | POA: Diagnosis not present

## 2018-10-01 DIAGNOSIS — I132 Hypertensive heart and chronic kidney disease with heart failure and with stage 5 chronic kidney disease, or end stage renal disease: Secondary | ICD-10-CM | POA: Diagnosis not present

## 2018-10-01 DIAGNOSIS — I5032 Chronic diastolic (congestive) heart failure: Secondary | ICD-10-CM | POA: Diagnosis not present

## 2018-10-02 DIAGNOSIS — Z23 Encounter for immunization: Secondary | ICD-10-CM | POA: Diagnosis not present

## 2018-10-02 DIAGNOSIS — N186 End stage renal disease: Secondary | ICD-10-CM | POA: Diagnosis not present

## 2018-10-02 DIAGNOSIS — Z992 Dependence on renal dialysis: Secondary | ICD-10-CM | POA: Diagnosis not present

## 2018-10-04 DIAGNOSIS — N186 End stage renal disease: Secondary | ICD-10-CM | POA: Diagnosis not present

## 2018-10-04 DIAGNOSIS — Z992 Dependence on renal dialysis: Secondary | ICD-10-CM | POA: Diagnosis not present

## 2018-10-04 DIAGNOSIS — Z23 Encounter for immunization: Secondary | ICD-10-CM | POA: Diagnosis not present

## 2018-10-06 ENCOUNTER — Other Ambulatory Visit: Payer: Medicare HMO

## 2018-10-07 DIAGNOSIS — I5032 Chronic diastolic (congestive) heart failure: Secondary | ICD-10-CM | POA: Diagnosis not present

## 2018-10-07 DIAGNOSIS — Z992 Dependence on renal dialysis: Secondary | ICD-10-CM | POA: Diagnosis not present

## 2018-10-07 DIAGNOSIS — N186 End stage renal disease: Secondary | ICD-10-CM | POA: Diagnosis not present

## 2018-10-07 DIAGNOSIS — Z23 Encounter for immunization: Secondary | ICD-10-CM | POA: Diagnosis not present

## 2018-10-08 DIAGNOSIS — D631 Anemia in chronic kidney disease: Secondary | ICD-10-CM | POA: Diagnosis not present

## 2018-10-08 DIAGNOSIS — F329 Major depressive disorder, single episode, unspecified: Secondary | ICD-10-CM | POA: Diagnosis not present

## 2018-10-08 DIAGNOSIS — N186 End stage renal disease: Secondary | ICD-10-CM | POA: Diagnosis not present

## 2018-10-08 DIAGNOSIS — E1122 Type 2 diabetes mellitus with diabetic chronic kidney disease: Secondary | ICD-10-CM | POA: Diagnosis not present

## 2018-10-08 DIAGNOSIS — I132 Hypertensive heart and chronic kidney disease with heart failure and with stage 5 chronic kidney disease, or end stage renal disease: Secondary | ICD-10-CM | POA: Diagnosis not present

## 2018-10-08 DIAGNOSIS — M1711 Unilateral primary osteoarthritis, right knee: Secondary | ICD-10-CM | POA: Diagnosis not present

## 2018-10-08 DIAGNOSIS — I5032 Chronic diastolic (congestive) heart failure: Secondary | ICD-10-CM | POA: Diagnosis not present

## 2018-10-08 DIAGNOSIS — E785 Hyperlipidemia, unspecified: Secondary | ICD-10-CM | POA: Diagnosis not present

## 2018-10-08 DIAGNOSIS — R569 Unspecified convulsions: Secondary | ICD-10-CM | POA: Diagnosis not present

## 2018-10-09 ENCOUNTER — Other Ambulatory Visit: Payer: Self-pay | Admitting: Internal Medicine

## 2018-10-09 DIAGNOSIS — Z23 Encounter for immunization: Secondary | ICD-10-CM | POA: Diagnosis not present

## 2018-10-09 DIAGNOSIS — Z992 Dependence on renal dialysis: Secondary | ICD-10-CM | POA: Diagnosis not present

## 2018-10-09 DIAGNOSIS — N186 End stage renal disease: Secondary | ICD-10-CM | POA: Diagnosis not present

## 2018-10-09 NOTE — Telephone Encounter (Signed)
Last filled 08-20-18 #60 Last OV 06-23-18 Next OV 12-24-18 [facility]

## 2018-10-11 DIAGNOSIS — Z992 Dependence on renal dialysis: Secondary | ICD-10-CM | POA: Diagnosis not present

## 2018-10-11 DIAGNOSIS — Z23 Encounter for immunization: Secondary | ICD-10-CM | POA: Diagnosis not present

## 2018-10-11 DIAGNOSIS — N186 End stage renal disease: Secondary | ICD-10-CM | POA: Diagnosis not present

## 2018-10-14 ENCOUNTER — Other Ambulatory Visit: Payer: Self-pay

## 2018-10-14 DIAGNOSIS — Z992 Dependence on renal dialysis: Secondary | ICD-10-CM | POA: Diagnosis not present

## 2018-10-14 DIAGNOSIS — N186 End stage renal disease: Secondary | ICD-10-CM | POA: Diagnosis not present

## 2018-10-14 DIAGNOSIS — Z23 Encounter for immunization: Secondary | ICD-10-CM | POA: Diagnosis not present

## 2018-10-14 MED ORDER — METOPROLOL SUCCINATE ER 25 MG PO TB24: 25.0000 mg | ORAL_TABLET | Freq: Every day | ORAL | 0 refills | Status: DC

## 2018-10-15 DIAGNOSIS — D631 Anemia in chronic kidney disease: Secondary | ICD-10-CM | POA: Diagnosis not present

## 2018-10-15 DIAGNOSIS — F329 Major depressive disorder, single episode, unspecified: Secondary | ICD-10-CM | POA: Diagnosis not present

## 2018-10-15 DIAGNOSIS — I132 Hypertensive heart and chronic kidney disease with heart failure and with stage 5 chronic kidney disease, or end stage renal disease: Secondary | ICD-10-CM | POA: Diagnosis not present

## 2018-10-15 DIAGNOSIS — E1122 Type 2 diabetes mellitus with diabetic chronic kidney disease: Secondary | ICD-10-CM | POA: Diagnosis not present

## 2018-10-15 DIAGNOSIS — N186 End stage renal disease: Secondary | ICD-10-CM | POA: Diagnosis not present

## 2018-10-15 DIAGNOSIS — M1711 Unilateral primary osteoarthritis, right knee: Secondary | ICD-10-CM | POA: Diagnosis not present

## 2018-10-15 DIAGNOSIS — R569 Unspecified convulsions: Secondary | ICD-10-CM | POA: Diagnosis not present

## 2018-10-15 DIAGNOSIS — E785 Hyperlipidemia, unspecified: Secondary | ICD-10-CM | POA: Diagnosis not present

## 2018-10-15 DIAGNOSIS — I5032 Chronic diastolic (congestive) heart failure: Secondary | ICD-10-CM | POA: Diagnosis not present

## 2018-10-16 DIAGNOSIS — Z992 Dependence on renal dialysis: Secondary | ICD-10-CM | POA: Diagnosis not present

## 2018-10-16 DIAGNOSIS — N186 End stage renal disease: Secondary | ICD-10-CM | POA: Diagnosis not present

## 2018-10-16 DIAGNOSIS — Z23 Encounter for immunization: Secondary | ICD-10-CM | POA: Diagnosis not present

## 2018-10-18 DIAGNOSIS — N186 End stage renal disease: Secondary | ICD-10-CM | POA: Diagnosis not present

## 2018-10-18 DIAGNOSIS — Z23 Encounter for immunization: Secondary | ICD-10-CM | POA: Diagnosis not present

## 2018-10-18 DIAGNOSIS — Z992 Dependence on renal dialysis: Secondary | ICD-10-CM | POA: Diagnosis not present

## 2018-10-20 DIAGNOSIS — Z23 Encounter for immunization: Secondary | ICD-10-CM | POA: Diagnosis not present

## 2018-10-20 DIAGNOSIS — Z992 Dependence on renal dialysis: Secondary | ICD-10-CM | POA: Diagnosis not present

## 2018-10-20 DIAGNOSIS — N186 End stage renal disease: Secondary | ICD-10-CM | POA: Diagnosis not present

## 2018-10-20 NOTE — Progress Notes (Signed)
Virtual Visit via Video Note   This visit type was conducted due to national recommendations for restrictions regarding the COVID-19 Pandemic (e.g. social distancing) in an effort to limit this patient's exposure and mitigate transmission in our community.  Due to her co-morbid illnesses, this patient is at least at moderate risk for complications without adequate follow up.  This format is felt to be most appropriate for this patient at this time.  All issues noted in this document were discussed and addressed.  A limited physical exam was performed with this format.  Please refer to the patient's chart for her consent to telehealth for [facility].   I connected with  Carrie [name] on 10/21/18 by a video enabled telemedicine application and verified that I am speaking with the correct person using two identifiers. I discussed the limitations of evaluation and management by telemedicine. The patient expressed understanding and agreed to proceed.   Evaluation Performed:  Follow-up visit  Date:  10/21/2018   ID:  Carrie [name], DOB 1950-11-17, MRN [id number]  Patient Location:  [place] [address]   Provider location:   [facility], [place] office  PCP:  Venia Carbon, MD  Cardiologist:  [facility]   Chief Complaint:  ESRD, on HD   History of Present Illness:    Carrie [name] is a 68 y.o. female who presents via audio/video conferencing for a telehealth visit today.   The patient does not symptoms concerning for COVID-19 infection (fever, chills, cough, or new SHORTNESS OF BREATH).   Patient has a past medical history of anemia Chronic renal insuff, creatinine 2.3 Morbid obesity Chronic neck pain History of brain cancer per the patient Seizure history, on Keppra Who presents for worsening shortness of breath  Stress test 07/2018 Pharmacological myocardial perfusion imaging study with no significant  ischemia  Normal wall motion, EF estimated at 66% No EKG changes concerning for ischemia at peak stress or in recovery. Low risk scan  Recently seen in the hospital for end-stage renal disease uremia August 07, 2018 Started dialysis Permacath placed right chest Continued on torsemide, For blood pressure had Norvasc losartan and metoprolol Procrit for anemia, hemoglobin 8.1 at that time  June 5th 2020, planned for Av fistula  Weight 238 106/46  Mon/Wed/Frid has HD  Torsemide on non HD days  Breathing better, since HD No significant cramping, rare  Cut back on foods, soda  Dry weight 111 Kg  Echocardiogram reviewed with her in detail Echo 10/2017 Normal LV function with grade 1 diastolic dysfunction Borderline mild aortic valve stenosis Unable to estimate right heart pressures   Prior CV studies:   The following studies were reviewed today:   Past Medical History:  Diagnosis Date  . Allergy   . Arthritis    "right knee" (10/26'/2017)  . CKD (chronic kidney disease), stage III ([place])    12/2015 (Dr. Lavonia Dana)  . Depression   . History of blood transfusion 2008   "when I had brain tumor surgery"  . History of MRSA infection 2008  . Hypertension   . 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 Type II diabetes mellitus ([place]) 2011   on metformin until yr ago- no med now - off due to kidneys   Past Surgical History:  Procedure Laterality Date  . BRAIN MENINGIOMA EXCISION  08/2006   Foramina magnum meningioma  . CERVICAL DISCECTOMY  1996   Dr. Alric Seton  .  CESAREAN SECTION  1981  . COLONOSCOPY    . DIALYSIS/PERMA CATHETER INSERTION N/A 08/04/2018   Procedure: DIALYSIS/PERMA CATHETER INSERTION;  Surgeon: Algernon Huxley, MD;  Location: [place] [facility];  Service: Cardiovascular;  Laterality: N/A;  . DILATION AND CURETTAGE OF UTERUS    . JOINT REPLACEMENT    . Right wrist x-ray  2007   Deg. at 1st MCP  . TOTAL HIP ARTHROPLASTY Left 03/27/2016   Procedure: LEFT TOTAL HIP  ARTHROPLASTY ANTERIOR APPROACH;  Surgeon: Mcarthur Rossetti, MD;  Location: [place];  Service: Orthopedics;  Laterality: Left;  . TOTAL KNEE ARTHROPLASTY Right 04/30/2017   Procedure: RIGHT TOTAL KNEE ARTHROPLASTY;  Surgeon: Mcarthur Rossetti, MD;  Location: [place];  Service: Orthopedics;  Laterality: Right;  . TUBAL LIGATION  1981     Current Meds  Medication Sig  . amLODipine (NORVASC) 10 MG tablet TAKE 1 TABLET BY MOUTH EVERY DAY (Patient taking differently: Take 10 mg by mouth daily. )  . atorvastatin (LIPITOR) 40 MG tablet TAKE 40 MG BY MOUTH ONCE DAILY (Patient taking differently: Take 40 mg by mouth daily. )  . calcitRIOL (ROCALTROL) 0.25 MCG capsule Take 1 capsule (0.25 mcg total) by mouth daily.  [name] levETIRAcetam (KEPPRA) 1000 MG tablet TAKE ONE TABLET BY MOUTH TWICE DAILY (Patient taking differently: Take 1,000 mg by mouth 2 (two) times daily. )  . LORazepam (ATIVAN) 0.5 MG tablet PLEASE SEE ATTACHED FOR DETAILED DIRECTIONS  . losartan (COZAAR) 50 MG tablet Take 50 mg by mouth daily.  . metoprolol succinate (TOPROL-XL) 25 MG 24 hr tablet Take 1 tablet (25 mg total) by mouth daily.  . multivitamin (RENA-VIT) TABS tablet Take 1 tablet by mouth at bedtime.  . sevelamer carbonate (RENVELA) 800 MG tablet   . torsemide (DEMADEX) 10 MG tablet Take 10 mg by mouth daily.     Allergies:   Naproxen sodium and Sulfamethoxazole-trimethoprim   Social History   Tobacco Use  . Smoking status: Never Smoker  . Smokeless tobacco: Never Used  Substance Use Topics  . Alcohol use: No    Alcohol/week: 0.0 standard drinks  . Drug use: No     Current Outpatient Medications on File Prior to Visit  Medication Sig Dispense Refill  . amLODipine (NORVASC) 10 MG tablet TAKE 1 TABLET BY MOUTH EVERY DAY (Patient taking differently: Take 10 mg by mouth daily. ) 90 tablet 3  . atorvastatin (LIPITOR) 40 MG tablet TAKE 40 MG BY MOUTH ONCE DAILY (Patient taking differently: Take 40 mg by mouth daily.  ) 90 tablet 3  . calcitRIOL (ROCALTROL) 0.25 MCG capsule Take 1 capsule (0.25 mcg total) by mouth daily. 30 capsule 0  . levETIRAcetam (KEPPRA) 1000 MG tablet TAKE ONE TABLET BY MOUTH TWICE DAILY (Patient taking differently: Take 1,000 mg by mouth 2 (two) times daily. ) 180 tablet 3  . LORazepam (ATIVAN) 0.5 MG tablet PLEASE SEE ATTACHED FOR DETAILED DIRECTIONS 60 tablet 0  . losartan (COZAAR) 50 MG tablet Take 50 mg by mouth daily.    . metoprolol succinate (TOPROL-XL) 25 MG 24 hr tablet Take 1 tablet (25 mg total) by mouth daily. 30 tablet 0  . multivitamin (RENA-VIT) TABS tablet Take 1 tablet by mouth at bedtime. 30 tablet 0  . sevelamer carbonate (RENVELA) 800 MG tablet     . torsemide (DEMADEX) 10 MG tablet Take 10 mg by mouth daily.     No current facility-administered medications on file prior to visit.  Family Hx: The patient's family history includes Breast cancer (age of onset: 43) in her mother; Heart disease in her sister; Heart failure in her sister; Hypertension in an other family member.  ROS:   Please see the history of present illness.    Review of Systems  Constitutional: Negative.   HENT: Negative.   Respiratory: Negative.   Cardiovascular: Negative.   Gastrointestinal: Negative.   Musculoskeletal: Negative.   Neurological: Negative.   Psychiatric/Behavioral: Negative.   All other systems reviewed and are negative.    Labs/Other Tests and Data Reviewed:    Recent Labs: 12/20/2017: ALT 10; Pro B Natriuretic peptide (BNP) 161.0 08/06/2018: BUN 27; Creatinine, Ser 3.71; Hemoglobin 8.1; Platelets 241; Potassium 3.8; Sodium 141   Recent Lipid Panel Lab Results  Component Value Date/Time   CHOL 178 12/20/2017 11:45 AM   TRIG 85.0 12/20/2017 11:45 AM   HDL 48.70 12/20/2017 11:45 AM   CHOLHDL 4 12/20/2017 11:45 AM   LDLCALC 113 (H) 12/20/2017 11:45 AM   LDLDIRECT 197.0 07/15/2009 10:35 AM    Wt Readings from Last 3 Encounters:  08/06/18 250 lb 14.1 oz  (113.8 kg)  07/28/18 254 lb (115.2 kg)  07/21/18 254 lb 6.4 oz (115.4 kg)     Exam:    Vital Signs: Vital signs may also be detailed in the HPI There were no vitals taken for this visit.  Wt Readings from Last 3 Encounters:  08/06/18 250 lb 14.1 oz (113.8 kg)  07/28/18 254 lb (115.2 kg)  07/21/18 254 lb 6.4 oz (115.4 kg)   Temp Readings from Last 3 Encounters:  08/07/18 98.3 F (36.8 C) (Oral)  06/23/18 97.8 F (36.6 C) (Oral)  06/03/18 98.4 F (36.9 C)   BP Readings from Last 3 Encounters:  08/07/18 (!) 126/52  07/28/18 140/70  07/21/18 (!) 153/71   Pulse Readings from Last 3 Encounters:  08/07/18 79  07/28/18 78  07/21/18 88    Weight 238 106/46 Pulse 80, resp 16  Well nourished, well developed female in no acute distress. Constitutional:  oriented to person, place, and time. No distress.  Head: Normocephalic and atraumatic.  Eyes:  no discharge. No scleral icterus.  Neck: Normal range of motion. Neck supple.  Pulmonary/Chest: No audible wheezing, no distress, appears comfortable Musculoskeletal: Normal range of motion.  no  tenderness or deformity.  Neurological:   Coordination normal. Full exam not performed Skin:  No rash Psychiatric:  normal mood and affect. behavior is normal. Thought content normal.    ASSESSMENT & PLAN:    Chronic diastolic CHF (congestive heart failure) (HCC) Euvolemic, better with HD  Chronic kidney disease (CKD), stage V (HCC) AV graft scheduled early June 2020 Stress test with no ischemia No further testing, acceptable risk for AV fistula Euvolemic on today's visit  Morbid obesity ([place]) Changed diet  Essential hypertension Blood pressure is well controlled on today's visit. No changes made to the medications.  Mixed hyperlipidemia On lipitor  Chronic kidney disease, stage IV (severe) (HCC) Now on HD 3x a week  Controlled type 2 diabetes mellitus with diabetic nephropathy, without long-term current use of insulin  (HCC)  Dyspnea on exertion  Sx resolved on HD   COVID-19 Education: The signs and symptoms of COVID-19 were discussed with the patient and how to seek care for testing (follow up with PCP or arrange E-visit).  The importance of social distancing was discussed today.  Patient Risk:   After full review of this patients clinical status, I feel  that they are at least moderate risk at this time.  Time:   Today, I have spent 25 minutes with the patient with telehealth technology discussing the cardiac and medical problems/diagnoses detailed above   10 min spent reviewing the chart prior to patient visit today   Medication Adjustments/Labs and Tests Ordered: Current medicines are reviewed at length with the patient today.  Concerns regarding medicines are outlined above.   Tests Ordered: No tests ordered   Medication Changes: No changes made   Disposition: Follow-up in 12 months   Signed, Ida Rogue, MD  10/21/2018 3:02 PM    [place] Office [address] [place] [address], [place],  [address]

## 2018-10-21 ENCOUNTER — Other Ambulatory Visit: Payer: Self-pay

## 2018-10-21 ENCOUNTER — Telehealth (INDEPENDENT_AMBULATORY_CARE_PROVIDER_SITE_OTHER): Payer: Medicare HMO | Admitting: Cardiovascular Disease

## 2018-10-21 ENCOUNTER — Ambulatory Visit: Payer: Medicare HMO | Admitting: Cardiovascular Disease

## 2018-10-21 DIAGNOSIS — N185 Chronic kidney disease, stage 5: Secondary | ICD-10-CM

## 2018-10-21 DIAGNOSIS — R0609 Other forms of dyspnea: Secondary | ICD-10-CM | POA: Diagnosis not present

## 2018-10-21 DIAGNOSIS — I5032 Chronic diastolic (congestive) heart failure: Secondary | ICD-10-CM | POA: Diagnosis not present

## 2018-10-21 DIAGNOSIS — E1121 Type 2 diabetes mellitus with diabetic nephropathy: Secondary | ICD-10-CM | POA: Diagnosis not present

## 2018-10-21 DIAGNOSIS — E782 Mixed hyperlipidemia: Secondary | ICD-10-CM | POA: Diagnosis not present

## 2018-10-21 DIAGNOSIS — I10 Essential (primary) hypertension: Secondary | ICD-10-CM

## 2018-10-21 DIAGNOSIS — N184 Chronic kidney disease, stage 4 (severe): Secondary | ICD-10-CM

## 2018-10-21 NOTE — Patient Instructions (Signed)

## 2018-10-22 DIAGNOSIS — Z992 Dependence on renal dialysis: Secondary | ICD-10-CM | POA: Diagnosis not present

## 2018-10-22 DIAGNOSIS — M1711 Unilateral primary osteoarthritis, right knee: Secondary | ICD-10-CM | POA: Diagnosis not present

## 2018-10-22 DIAGNOSIS — E785 Hyperlipidemia, unspecified: Secondary | ICD-10-CM | POA: Diagnosis not present

## 2018-10-22 DIAGNOSIS — F329 Major depressive disorder, single episode, unspecified: Secondary | ICD-10-CM | POA: Diagnosis not present

## 2018-10-22 DIAGNOSIS — I132 Hypertensive heart and chronic kidney disease with heart failure and with stage 5 chronic kidney disease, or end stage renal disease: Secondary | ICD-10-CM | POA: Diagnosis not present

## 2018-10-22 DIAGNOSIS — R569 Unspecified convulsions: Secondary | ICD-10-CM | POA: Diagnosis not present

## 2018-10-22 DIAGNOSIS — Z23 Encounter for immunization: Secondary | ICD-10-CM | POA: Diagnosis not present

## 2018-10-22 DIAGNOSIS — D631 Anemia in chronic kidney disease: Secondary | ICD-10-CM | POA: Diagnosis not present

## 2018-10-22 DIAGNOSIS — E1122 Type 2 diabetes mellitus with diabetic chronic kidney disease: Secondary | ICD-10-CM | POA: Diagnosis not present

## 2018-10-22 DIAGNOSIS — I5032 Chronic diastolic (congestive) heart failure: Secondary | ICD-10-CM | POA: Diagnosis not present

## 2018-10-22 DIAGNOSIS — N186 End stage renal disease: Secondary | ICD-10-CM | POA: Diagnosis not present

## 2018-10-24 DIAGNOSIS — N186 End stage renal disease: Secondary | ICD-10-CM | POA: Diagnosis not present

## 2018-10-24 DIAGNOSIS — Z23 Encounter for immunization: Secondary | ICD-10-CM | POA: Diagnosis not present

## 2018-10-24 DIAGNOSIS — Z992 Dependence on renal dialysis: Secondary | ICD-10-CM | POA: Diagnosis not present

## 2018-10-27 DIAGNOSIS — Z992 Dependence on renal dialysis: Secondary | ICD-10-CM | POA: Diagnosis not present

## 2018-10-27 DIAGNOSIS — N186 End stage renal disease: Secondary | ICD-10-CM | POA: Diagnosis not present

## 2018-10-27 DIAGNOSIS — Z23 Encounter for immunization: Secondary | ICD-10-CM | POA: Diagnosis not present

## 2018-10-29 DIAGNOSIS — F329 Major depressive disorder, single episode, unspecified: Secondary | ICD-10-CM | POA: Diagnosis not present

## 2018-10-29 DIAGNOSIS — M1711 Unilateral primary osteoarthritis, right knee: Secondary | ICD-10-CM | POA: Diagnosis not present

## 2018-10-29 DIAGNOSIS — Z23 Encounter for immunization: Secondary | ICD-10-CM | POA: Diagnosis not present

## 2018-10-29 DIAGNOSIS — I5032 Chronic diastolic (congestive) heart failure: Secondary | ICD-10-CM | POA: Diagnosis not present

## 2018-10-29 DIAGNOSIS — D631 Anemia in chronic kidney disease: Secondary | ICD-10-CM | POA: Diagnosis not present

## 2018-10-29 DIAGNOSIS — Z992 Dependence on renal dialysis: Secondary | ICD-10-CM | POA: Diagnosis not present

## 2018-10-29 DIAGNOSIS — E1122 Type 2 diabetes mellitus with diabetic chronic kidney disease: Secondary | ICD-10-CM | POA: Diagnosis not present

## 2018-10-29 DIAGNOSIS — I132 Hypertensive heart and chronic kidney disease with heart failure and with stage 5 chronic kidney disease, or end stage renal disease: Secondary | ICD-10-CM | POA: Diagnosis not present

## 2018-10-29 DIAGNOSIS — N186 End stage renal disease: Secondary | ICD-10-CM | POA: Diagnosis not present

## 2018-10-29 DIAGNOSIS — R569 Unspecified convulsions: Secondary | ICD-10-CM | POA: Diagnosis not present

## 2018-10-29 DIAGNOSIS — E785 Hyperlipidemia, unspecified: Secondary | ICD-10-CM | POA: Diagnosis not present

## 2018-10-31 DIAGNOSIS — Z992 Dependence on renal dialysis: Secondary | ICD-10-CM | POA: Diagnosis not present

## 2018-10-31 DIAGNOSIS — Z23 Encounter for immunization: Secondary | ICD-10-CM | POA: Diagnosis not present

## 2018-10-31 DIAGNOSIS — N186 End stage renal disease: Secondary | ICD-10-CM | POA: Diagnosis not present

## 2018-11-02 DIAGNOSIS — N186 End stage renal disease: Secondary | ICD-10-CM | POA: Diagnosis not present

## 2018-11-02 DIAGNOSIS — Z992 Dependence on renal dialysis: Secondary | ICD-10-CM | POA: Diagnosis not present

## 2018-11-03 ENCOUNTER — Other Ambulatory Visit (INDEPENDENT_AMBULATORY_CARE_PROVIDER_SITE_OTHER): Payer: Self-pay | Admitting: Nurse Practitioner

## 2018-11-03 DIAGNOSIS — N186 End stage renal disease: Secondary | ICD-10-CM | POA: Diagnosis not present

## 2018-11-03 DIAGNOSIS — Z992 Dependence on renal dialysis: Secondary | ICD-10-CM | POA: Diagnosis not present

## 2018-11-03 DIAGNOSIS — Z23 Encounter for immunization: Secondary | ICD-10-CM | POA: Diagnosis not present

## 2018-11-04 ENCOUNTER — Other Ambulatory Visit: Payer: Self-pay

## 2018-11-04 ENCOUNTER — Encounter
Admission: RE | Admit: 2018-11-04 | Discharge: 2018-11-04 | Disposition: A | Discharge status: Home / Self Care | Payer: Medicare HMO | Source: Ambulatory Visit | Attending: Vascular Surgery | Admitting: Vascular Surgery

## 2018-11-04 DIAGNOSIS — E7800 Pure hypercholesterolemia, unspecified: Secondary | ICD-10-CM | POA: Diagnosis not present

## 2018-11-04 DIAGNOSIS — Z6841 Body Mass Index (BMI) 40.0 and over, adult: Secondary | ICD-10-CM | POA: Diagnosis not present

## 2018-11-04 DIAGNOSIS — I120 Hypertensive chronic kidney disease with stage 5 chronic kidney disease or end stage renal disease: Secondary | ICD-10-CM | POA: Diagnosis not present

## 2018-11-04 DIAGNOSIS — Z01812 Encounter for preprocedural laboratory examination: Secondary | ICD-10-CM | POA: Insufficient documentation

## 2018-11-04 DIAGNOSIS — N186 End stage renal disease: Secondary | ICD-10-CM | POA: Diagnosis not present

## 2018-11-04 DIAGNOSIS — Z1159 Encounter for screening for other viral diseases: Secondary | ICD-10-CM | POA: Insufficient documentation

## 2018-11-04 DIAGNOSIS — E669 Obesity, unspecified: Secondary | ICD-10-CM | POA: Diagnosis not present

## 2018-11-04 DIAGNOSIS — F329 Major depressive disorder, single episode, unspecified: Secondary | ICD-10-CM | POA: Diagnosis not present

## 2018-11-04 DIAGNOSIS — I509 Heart failure, unspecified: Secondary | ICD-10-CM | POA: Diagnosis not present

## 2018-11-04 DIAGNOSIS — Z79899 Other long term (current) drug therapy: Secondary | ICD-10-CM | POA: Diagnosis not present

## 2018-11-04 DIAGNOSIS — E1122 Type 2 diabetes mellitus with diabetic chronic kidney disease: Secondary | ICD-10-CM | POA: Diagnosis not present

## 2018-11-04 HISTORY — DX: Anemia, unspecified: D64.9

## 2018-11-04 LAB — BASIC METABOLIC PANEL
Anion gap: 10 (ref 5–15)
BUN: 36 mg/dL — ABNORMAL HIGH (ref 8–23)
CO2: 28 mmol/L (ref 22–32)
Calcium: 9 mg/dL (ref 8.9–10.3)
Chloride: 99 mmol/L (ref 98–111)
Creatinine, Ser: 5.46 mg/dL — ABNORMAL HIGH (ref 0.44–1.00)
GFR calc Af Amer: 9 mL/min — ABNORMAL LOW (ref >60)
GFR calc non Af Amer: 7 mL/min — ABNORMAL LOW (ref >60)
Glucose, Bld: 91 mg/dL (ref 70–99)
Potassium: 3.8 mmol/L (ref 3.5–5.1)
Sodium: 137 mmol/L (ref 135–145)

## 2018-11-04 LAB — TYPE AND SCREEN
ABO/RH(D): O POS
Antibody Screen: NEGATIVE

## 2018-11-04 LAB — CBC WITH DIFFERENTIAL/PLATELET
Abs Immature Granulocytes: 0.02 10*3/uL (ref 0.00–0.07)
Basophils Absolute: 0 10*3/uL (ref 0.0–0.1)
Basophils Relative: 1 %
Eosinophils Absolute: 0.1 10*3/uL (ref 0.0–0.5)
Eosinophils Relative: 1 %
HCT: 38.1 % (ref 36.0–46.0)
Hemoglobin: 11.8 g/dL — ABNORMAL LOW (ref 12.0–15.0)
Immature Granulocytes: 0 %
Lymphocytes Relative: 43 %
Lymphs Abs: 2.5 10*3/uL (ref 0.7–4.0)
MCH: 28.9 pg (ref 26.0–34.0)
MCHC: 31 g/dL (ref 30.0–36.0)
MCV: 93.4 fL (ref 80.0–100.0)
Monocytes Absolute: 0.7 10*3/uL (ref 0.1–1.0)
Monocytes Relative: 11 %
Neutro Abs: 2.6 10*3/uL (ref 1.7–7.7)
Neutrophils Relative %: 44 %
Platelets: 278 10*3/uL (ref 150–400)
RBC: 4.08 MIL/uL (ref 3.87–5.11)
RDW: 15.7 % — ABNORMAL HIGH (ref 11.5–15.5)
WBC: 5.8 10*3/uL (ref 4.0–10.5)
nRBC: 0 % (ref 0.0–0.2)

## 2018-11-04 LAB — APTT: aPTT: 35 seconds (ref 24–36)

## 2018-11-04 LAB — PROTIME-INR
INR: 1 (ref 0.8–1.2)
Prothrombin Time: 12.6 seconds (ref 11.4–15.2)

## 2018-11-04 NOTE — Patient Instructions (Signed)
Your procedure is scheduled on: Friday 11/07/18 Report to [facility]. To find out your arrival time please call [phone] between 1PM - 3PM on Thursday 11/06/18.  Remember: Instructions that are not followed completely may result in serious medical risk, up to and including death, or upon the discretion of your surgeon and anesthesiologist your surgery may need to be rescheduled.     _X__ 1. Do not eat food after midnight the night before your procedure.                 No gum chewing or hard candies. You may drink clear liquids up to 2 hours                 before you are scheduled to arrive for your surgery- DO not drink clear                 liquids within 2 hours of the start of your surgery.                 Clear Liquids include:  water, apple juice without pulp, clear carbohydrate                 drink such as Clearfast or Gatorade, Black Coffee or Tea (Do not add                 anything to coffee or tea).  __X__2.  On the morning of surgery brush your teeth with toothpaste and water, you                 may rinse your mouth with mouthwash if you wish.  Do not swallow any              toothpaste of mouthwash.     _X__ 3.  No Alcohol for 24 hours before or after surgery.   _X__ 4.  Do Not Smoke or use e-cigarettes For 24 Hours Prior to Your Surgery.                 Do not use any chewable tobacco products for at least 6 hours prior to                 surgery.  ____  5.  Bring all medications with you on the day of surgery if instructed.   __X__  6.  Notify your doctor if there is any change in your medical condition      (cold, fever, infections).     Do not wear jewelry, make-up, hairpins, clips or nail polish. Do not wear lotions, powders, or perfumes.  Do not shave 48 hours prior to surgery. Men may shave face and neck. Do not bring valuables to the hospital.    [facility] is not responsible for any belongings or  valuables.  Contacts, dentures/partials or body piercings may not be worn into surgery. Bring a case for your contacts, glasses or hearing aids, a denture cup will be supplied. Leave your suitcase in the car. After surgery it may be brought to your room. For patients admitted to the hospital, discharge time is determined by your treatment team.   Patients discharged the day of surgery will not be allowed to drive home.   Please read over the following fact sheets that you were given:   MRSA Information  __X__ Take these medicines the morning of surgery with A SIP OF WATER:  1. amLODipine (NORVASC)  2. atorvastatin (LIPITOR)  3. levETIRAcetam (KEPPRA)  4. metoprolol succinate (TOPROL-XL)   5.  6.  ____ Fleet Enema (as directed)   __X__ Use CHG Soap/SAGE wipes as directed  ____ Use inhalers on the day of surgery  ____ Stop metformin/Janumet/Farxiga 2 days prior to surgery    ____ Take 1/2 of usual insulin dose the night before surgery. No insulin the morning          of surgery.   ____ Stop Blood Thinners Coumadin/Plavix/Xarelto/Pleta/Pradaxa/Eliquis/Effient/Aspirin  on   Or contact your Surgeon, Cardiologist or Medical Doctor regarding  ability to stop your blood thinners  __X__ Stop Anti-inflammatories 7 days before surgery such as Advil, Ibuprofen, Motrin,  BC or Goodies Powder, Naprosyn, Naproxen, Aleve, Aspirin    __X__ Stop all herbal supplements, fish oil or vitamin E until after surgery.    ____ Bring C-Pap to the hospital.

## 2018-11-05 DIAGNOSIS — N186 End stage renal disease: Secondary | ICD-10-CM | POA: Diagnosis not present

## 2018-11-05 DIAGNOSIS — R569 Unspecified convulsions: Secondary | ICD-10-CM | POA: Diagnosis not present

## 2018-11-05 DIAGNOSIS — I5032 Chronic diastolic (congestive) heart failure: Secondary | ICD-10-CM | POA: Diagnosis not present

## 2018-11-05 DIAGNOSIS — Z992 Dependence on renal dialysis: Secondary | ICD-10-CM | POA: Diagnosis not present

## 2018-11-05 DIAGNOSIS — M1711 Unilateral primary osteoarthritis, right knee: Secondary | ICD-10-CM | POA: Diagnosis not present

## 2018-11-05 DIAGNOSIS — F329 Major depressive disorder, single episode, unspecified: Secondary | ICD-10-CM | POA: Diagnosis not present

## 2018-11-05 DIAGNOSIS — E1122 Type 2 diabetes mellitus with diabetic chronic kidney disease: Secondary | ICD-10-CM | POA: Diagnosis not present

## 2018-11-05 DIAGNOSIS — Z23 Encounter for immunization: Secondary | ICD-10-CM | POA: Diagnosis not present

## 2018-11-05 DIAGNOSIS — D631 Anemia in chronic kidney disease: Secondary | ICD-10-CM | POA: Diagnosis not present

## 2018-11-05 DIAGNOSIS — E785 Hyperlipidemia, unspecified: Secondary | ICD-10-CM | POA: Diagnosis not present

## 2018-11-05 DIAGNOSIS — I132 Hypertensive heart and chronic kidney disease with heart failure and with stage 5 chronic kidney disease, or end stage renal disease: Secondary | ICD-10-CM | POA: Diagnosis not present

## 2018-11-05 LAB — NOVEL CORONAVIRUS, NAA (HOSP ORDER, SEND-OUT TO REF LAB; TAT 18-24 HRS): SARS-CoV-2, NAA: NOT DETECTED

## 2018-11-06 ENCOUNTER — Telehealth (INDEPENDENT_AMBULATORY_CARE_PROVIDER_SITE_OTHER): Payer: Self-pay | Admitting: Vascular Surgery

## 2018-11-06 DIAGNOSIS — Z992 Dependence on renal dialysis: Secondary | ICD-10-CM | POA: Diagnosis not present

## 2018-11-06 DIAGNOSIS — N186 End stage renal disease: Secondary | ICD-10-CM | POA: Diagnosis not present

## 2018-11-06 DIAGNOSIS — Z23 Encounter for immunization: Secondary | ICD-10-CM | POA: Diagnosis not present

## 2018-11-06 NOTE — Telephone Encounter (Signed)
Lattie Haw with [facility] calling bc patient told her that she would need additional home health after surgery on Friday. I advised Lattie Haw that orders for additional care would be put in if needed after surgery. She verbalized understanding. AS, CMA  [facility] fax: [phone]

## 2018-11-07 ENCOUNTER — Ambulatory Visit: Payer: Medicare HMO | Admitting: Certified Registered"

## 2018-11-07 ENCOUNTER — Encounter
Admission: RE | Disposition: A | Discharge status: Home / Self Care | Payer: Self-pay | Source: Home / Self Care | Attending: Vascular Surgery

## 2018-11-07 ENCOUNTER — Ambulatory Visit
Admission: RE | Admit: 2018-11-07 | Discharge: 2018-11-07 | Disposition: A | Discharge status: Home / Self Care | Payer: Medicare HMO | Attending: Vascular Surgery | Admitting: Vascular Surgery

## 2018-11-07 ENCOUNTER — Other Ambulatory Visit: Payer: Self-pay

## 2018-11-07 ENCOUNTER — Encounter: Payer: Self-pay | Admitting: *Deleted

## 2018-11-07 DIAGNOSIS — Z79899 Other long term (current) drug therapy: Secondary | ICD-10-CM | POA: Insufficient documentation

## 2018-11-07 DIAGNOSIS — E669 Obesity, unspecified: Secondary | ICD-10-CM | POA: Diagnosis not present

## 2018-11-07 DIAGNOSIS — I509 Heart failure, unspecified: Secondary | ICD-10-CM | POA: Diagnosis not present

## 2018-11-07 DIAGNOSIS — E1122 Type 2 diabetes mellitus with diabetic chronic kidney disease: Secondary | ICD-10-CM | POA: Diagnosis not present

## 2018-11-07 DIAGNOSIS — Z6841 Body Mass Index (BMI) 40.0 and over, adult: Secondary | ICD-10-CM | POA: Insufficient documentation

## 2018-11-07 DIAGNOSIS — I120 Hypertensive chronic kidney disease with stage 5 chronic kidney disease or end stage renal disease: Secondary | ICD-10-CM | POA: Diagnosis not present

## 2018-11-07 DIAGNOSIS — Z992 Dependence on renal dialysis: Secondary | ICD-10-CM | POA: Diagnosis not present

## 2018-11-07 DIAGNOSIS — Z1159 Encounter for screening for other viral diseases: Secondary | ICD-10-CM | POA: Diagnosis not present

## 2018-11-07 DIAGNOSIS — F329 Major depressive disorder, single episode, unspecified: Secondary | ICD-10-CM | POA: Insufficient documentation

## 2018-11-07 DIAGNOSIS — T82868A Thrombosis of vascular prosthetic devices, implants and grafts, initial encounter: Secondary | ICD-10-CM | POA: Diagnosis not present

## 2018-11-07 DIAGNOSIS — E7800 Pure hypercholesterolemia, unspecified: Secondary | ICD-10-CM | POA: Insufficient documentation

## 2018-11-07 DIAGNOSIS — I5032 Chronic diastolic (congestive) heart failure: Secondary | ICD-10-CM | POA: Diagnosis not present

## 2018-11-07 DIAGNOSIS — N186 End stage renal disease: Secondary | ICD-10-CM | POA: Insufficient documentation

## 2018-11-07 DIAGNOSIS — I132 Hypertensive heart and chronic kidney disease with heart failure and with stage 5 chronic kidney disease, or end stage renal disease: Secondary | ICD-10-CM | POA: Diagnosis not present

## 2018-11-07 HISTORY — PX: AV FISTULA PLACEMENT: SHX1204

## 2018-11-07 LAB — POCT I-STAT 4, (NA,K, GLUC, HGB,HCT)
Glucose, Bld: 109 mg/dL — ABNORMAL HIGH (ref 70–99)
HCT: 39 % (ref 36.0–46.0)
Hemoglobin: 13.3 g/dL (ref 12.0–15.0)
Potassium: 3.7 mmol/L (ref 3.5–5.1)
Sodium: 137 mmol/L (ref 135–145)

## 2018-11-07 LAB — GLUCOSE, CAPILLARY
Glucose-Capillary: 94 mg/dL (ref 70–99)
Glucose-Capillary: 104 mg/dL — ABNORMAL HIGH (ref 70–99)

## 2018-11-07 LAB — ABO/RH: ABO/RH(D): O POS

## 2018-11-07 SURGERY — ARTERIOVENOUS (AV) FISTULA CREATION
Anesthesia: General | Laterality: Left

## 2018-11-07 MED ORDER — FENTANYL CITRATE (PF) 100 MCG/2ML IJ SOLN
25.0000 ug | INTRAMUSCULAR | Status: DC | PRN
  Administered 2018-11-07: 50 ug via INTRAVENOUS

## 2018-11-07 MED ORDER — HYDROCODONE-ACETAMINOPHEN 5-325 MG PO TABS
ORAL_TABLET | ORAL | Status: AC
  Administered 2018-11-07: 11:00:00 2 via ORAL
  Filled 2018-11-07: qty 2

## 2018-11-07 MED ORDER — PHENYLEPHRINE HCL (PRESSORS) 10 MG/ML IV SOLN
INTRAVENOUS | Status: DC | PRN
  Administered 2018-11-07 (×3): 100 ug via INTRAVENOUS

## 2018-11-07 MED ORDER — CHLORHEXIDINE GLUCONATE CLOTH 2 % EX PADS: 6.0000 | MEDICATED_PAD | Freq: Once | CUTANEOUS | Status: DC

## 2018-11-07 MED ORDER — BUPIVACAINE HCL (PF) 0.5 % IJ SOLN
INTRAMUSCULAR | Status: AC
  Filled 2018-11-07: qty 30

## 2018-11-07 MED ORDER — HYDROCODONE-ACETAMINOPHEN 5-325 MG PO TABS: 1.0000 | ORAL_TABLET | Freq: Four times a day (QID) | ORAL | 0 refills | Status: DC | PRN

## 2018-11-07 MED ORDER — PROPOFOL 10 MG/ML IV BOLUS
INTRAVENOUS | Status: AC
  Filled 2018-11-07: qty 20

## 2018-11-07 MED ORDER — LIDOCAINE HCL (CARDIAC) PF 100 MG/5ML IV SOSY
PREFILLED_SYRINGE | INTRAVENOUS | Status: DC | PRN
  Administered 2018-11-07: 80 mg via INTRAVENOUS

## 2018-11-07 MED ORDER — BUPIVACAINE HCL (PF) 0.5 % IJ SOLN
INTRAMUSCULAR | Status: DC | PRN
  Administered 2018-11-07: 15 mL

## 2018-11-07 MED ORDER — FENTANYL CITRATE (PF) 100 MCG/2ML IJ SOLN
INTRAMUSCULAR | Status: AC
  Administered 2018-11-07: 50 ug via INTRAVENOUS
  Filled 2018-11-07: qty 2

## 2018-11-07 MED ORDER — FAMOTIDINE 20 MG PO TABS
ORAL_TABLET | ORAL | Status: AC
  Filled 2018-11-07: qty 1

## 2018-11-07 MED ORDER — EVICEL 2 ML EX KIT
PACK | CUTANEOUS | Status: AC
  Filled 2018-11-07: qty 1

## 2018-11-07 MED ORDER — EPHEDRINE SULFATE 50 MG/ML IJ SOLN
INTRAMUSCULAR | Status: DC | PRN
  Administered 2018-11-07: 10 mg via INTRAVENOUS
  Administered 2018-11-07: 5 mg via INTRAVENOUS

## 2018-11-07 MED ORDER — PROPOFOL 10 MG/ML IV BOLUS
INTRAVENOUS | Status: DC | PRN
  Administered 2018-11-07: 150 mg via INTRAVENOUS

## 2018-11-07 MED ORDER — SODIUM CHLORIDE 0.9 % IV SOLN
INTRAVENOUS | Status: DC
  Administered 2018-11-07: 07:00:00 via INTRAVENOUS

## 2018-11-07 MED ORDER — FENTANYL CITRATE (PF) 100 MCG/2ML IJ SOLN
INTRAMUSCULAR | Status: DC | PRN
  Administered 2018-11-07: 50 ug via INTRAVENOUS
  Administered 2018-11-07 (×2): 25 ug via INTRAVENOUS

## 2018-11-07 MED ORDER — FENTANYL CITRATE (PF) 100 MCG/2ML IJ SOLN
INTRAMUSCULAR | Status: AC
  Filled 2018-11-07: qty 2

## 2018-11-07 MED ORDER — PAPAVERINE HCL 30 MG/ML IJ SOLN
INTRAMUSCULAR | Status: AC
  Filled 2018-11-07: qty 2

## 2018-11-07 MED ORDER — ONDANSETRON HCL 4 MG/2ML IJ SOLN
INTRAMUSCULAR | Status: DC | PRN
  Administered 2018-11-07: 4 mg via INTRAVENOUS

## 2018-11-07 MED ORDER — FAMOTIDINE 20 MG PO TABS
20.0000 mg | ORAL_TABLET | Freq: Once | ORAL | Status: AC
  Administered 2018-11-07: 20 mg via ORAL

## 2018-11-07 MED ORDER — BUPIVACAINE LIPOSOME 1.3 % IJ SUSP
INTRAMUSCULAR | Status: AC
  Filled 2018-11-07: qty 20

## 2018-11-07 MED ORDER — ONDANSETRON HCL 4 MG/2ML IJ SOLN: 4.0000 mg | Freq: Once | INTRAMUSCULAR | Status: DC | PRN

## 2018-11-07 MED ORDER — CEFAZOLIN SODIUM-DEXTROSE 1-4 GM/50ML-% IV SOLN
1.0000 g | INTRAVENOUS | Status: AC
  Administered 2018-11-07: 1 g via INTRAVENOUS

## 2018-11-07 MED ORDER — BUPIVACAINE LIPOSOME 1.3 % IJ SUSP
INTRAMUSCULAR | Status: DC | PRN
  Administered 2018-11-07: 35 mL

## 2018-11-07 MED ORDER — CEFAZOLIN SODIUM-DEXTROSE 1-4 GM/50ML-% IV SOLN
INTRAVENOUS | Status: AC
  Filled 2018-11-07: qty 50

## 2018-11-07 MED ORDER — EVICEL 2 ML EX KIT
PACK | CUTANEOUS | Status: DC | PRN
  Administered 2018-11-07: 2 mL via TOPICAL

## 2018-11-07 MED ORDER — HYDROCODONE-ACETAMINOPHEN 5-325 MG PO TABS
1.0000 | ORAL_TABLET | Freq: Once | ORAL | Status: AC
  Administered 2018-11-07: 11:00:00 2 via ORAL

## 2018-11-07 MED ORDER — HEPARIN SODIUM (PORCINE) 5000 UNIT/ML IJ SOLN
INTRAMUSCULAR | Status: AC
  Filled 2018-11-07: qty 1

## 2018-11-07 SURGICAL SUPPLY — 66 items
ADH SKN CLS APL DERMABOND .7 (GAUZE/BANDAGES/DRESSINGS) ×1
APL PRP STRL LF DISP 70% ISPRP (MISCELLANEOUS) ×1
APPLIER CLIP 11 MED OPEN (CLIP)
APPLIER CLIP 9.375 SM OPEN (CLIP)
APR CLP MED 11 20 MLT OPN (CLIP)
APR CLP SM 9.3 20 MLT OPN (CLIP)
BAG DECANTER FOR FLEXI CONT (MISCELLANEOUS) ×3 IMPLANT
BLADE SURG SZ11 CARB STEEL (BLADE) ×3 IMPLANT
BOOT SUTURE AID YELLOW STND (SUTURE) ×3 IMPLANT
BRUSH SCRUB EZ  4% CHG (MISCELLANEOUS) ×2
BRUSH SCRUB EZ 4% CHG (MISCELLANEOUS) ×1 IMPLANT
CANISTER SUCT 1200ML W/VALVE (MISCELLANEOUS) ×3 IMPLANT
CHLORAPREP W/TINT 26 (MISCELLANEOUS) ×3 IMPLANT
CLIP APPLIE 11 MED OPEN (CLIP) IMPLANT
CLIP APPLIE 9.375 SM OPEN (CLIP) IMPLANT
COVER WAND RF STERILE (DRAPES) ×3 IMPLANT
DERMABOND ADVANCED (GAUZE/BANDAGES/DRESSINGS) ×2
DERMABOND ADVANCED .7 DNX12 (GAUZE/BANDAGES/DRESSINGS) ×1 IMPLANT
DRESSING SURGICEL FIBRLLR 1X2 (HEMOSTASIS) ×1 IMPLANT
DRSG SURGICEL FIBRILLAR 1X2 (HEMOSTASIS)
ELECT CAUTERY BLADE 6.4 (BLADE) ×3 IMPLANT
ELECT REM PT RETURN 9FT ADLT (ELECTROSURGICAL) ×3
ELECTRODE REM PT RTRN 9FT ADLT (ELECTROSURGICAL) ×1 IMPLANT
GEL ULTRASOUND 20GR AQUASONIC (MISCELLANEOUS) IMPLANT
GLOVE BIO SURGEON STRL SZ7 (GLOVE) ×5 IMPLANT
GLOVE INDICATOR 7.5 STRL GRN (GLOVE) ×5 IMPLANT
GLOVE SURG SYN 8.0 (GLOVE) ×3 IMPLANT
GLOVE SURG SYN 8.0 PF PI (GLOVE) ×1 IMPLANT
GORETEX SUTURE ×4 IMPLANT
GOWN STRL REUS W/ TWL LRG LVL3 (GOWN DISPOSABLE) ×2 IMPLANT
GOWN STRL REUS W/ TWL XL LVL3 (GOWN DISPOSABLE) ×1 IMPLANT
GOWN STRL REUS W/TWL LRG LVL3 (GOWN DISPOSABLE) ×3
GOWN STRL REUS W/TWL XL LVL3 (GOWN DISPOSABLE) ×6
GRAFT PROPATEN STD WALL 4 7X45 (Vascular Products) ×2 IMPLANT
IV NS 500ML (IV SOLUTION) ×3
IV NS 500ML BAXH (IV SOLUTION) ×1 IMPLANT
KIT TURNOVER KIT A (KITS) ×3 IMPLANT
LABEL OR SOLS (LABEL) ×3 IMPLANT
LOOP RED MAXI  1X406MM (MISCELLANEOUS) ×2
LOOP VESSEL MAXI 1X406 RED (MISCELLANEOUS) ×1 IMPLANT
LOOP VESSEL MINI 0.8X406 BLUE (MISCELLANEOUS) ×2 IMPLANT
LOOPS BLUE MINI 0.8X406MM (MISCELLANEOUS) ×4
NDL FILTER BLUNT 18X1 1/2 (NEEDLE) ×1 IMPLANT
NDL HYPO 30X.5 LL (NEEDLE) IMPLANT
NEEDLE FILTER BLUNT 18X 1/2SAF (NEEDLE) ×2
NEEDLE FILTER BLUNT 18X1 1/2 (NEEDLE) ×1 IMPLANT
NEEDLE HYPO 30X.5 LL (NEEDLE) IMPLANT
PACK EXTREMITY ARMC (MISCELLANEOUS) ×3 IMPLANT
PAD PREP 24X41 OB/GYN DISP (PERSONAL CARE ITEMS) ×1 IMPLANT
PUNCH SURGICAL ROTATE 2.7MM (MISCELLANEOUS) IMPLANT
STOCKINETTE 48X4 2 PLY STRL (GAUZE/BANDAGES/DRESSINGS) ×1 IMPLANT
STOCKINETTE STRL 4IN 9604848 (GAUZE/BANDAGES/DRESSINGS) ×3 IMPLANT
SUT MNCRL+ 5-0 UNDYED PC-3 (SUTURE) ×1 IMPLANT
SUT MONOCRYL 5-0 (SUTURE) ×2
SUT PROLENE 6 0 BV (SUTURE) ×12 IMPLANT
SUT SILK 2 0 (SUTURE) ×3
SUT SILK 2 0 SH (SUTURE) ×2 IMPLANT
SUT SILK 2-0 18XBRD TIE 12 (SUTURE) ×1 IMPLANT
SUT SILK 3 0 (SUTURE) ×3
SUT SILK 3-0 18XBRD TIE 12 (SUTURE) ×1 IMPLANT
SUT SILK 4 0 (SUTURE) ×3
SUT SILK 4-0 18XBRD TIE 12 (SUTURE) ×1 IMPLANT
SUT VIC AB 3-0 SH 27 (SUTURE) ×3
SUT VIC AB 3-0 SH 27X BRD (SUTURE) ×1 IMPLANT
SYR 20CC LL (SYRINGE) ×3 IMPLANT
SYR 3ML LL SCALE MARK (SYRINGE) ×3 IMPLANT

## 2018-11-07 NOTE — Op Note (Signed)
OPERATIVE NOTE   PROCEDURE: left brachial axillary arteriovenous graft placement  PRE-OPERATIVE DIAGNOSIS: End Stage Renal Disease  POST-OPERATIVE DIAGNOSIS: End Stage Renal Disease  SURGEON: [name]  ASSISTANT(S): Ms. Hezzie Bump  ANESTHESIA: general  ESTIMATED BLOOD LOSS: <50 cc  FINDING(S): Basilic vein was imaged prior to the procedure at the elbow it was 4 mm however the confluence of the basilic vein with a deep brachial vein occurred after only 5 cm.  This would not allow adequate length for creation of a useful fistula and I elected to perform a brachial axillary graft.  SPECIMEN(S):  none  INDICATIONS:   Carrie [name] is a 68 y.o. female who presents with end stage renal disease.  The patient is scheduled for left brachial axillary AV graft placement.  The patient is aware the risks include but are not limited to: bleeding, infection, steal syndrome, nerve damage, ischemic monomelic neuropathy, failure to mature, and need for additional procedures.  The patient is aware of the risks of the procedure and elects to proceed forward.  DESCRIPTION: After full informed written consent was obtained from the patient, the patient was brought back to the operating room and placed supine upon the operating table.  Prior to induction, the patient received IV antibiotics.   After obtaining adequate anesthesia, the patient was then prepped and draped in the standard fashion for a left arm access procedure.   A first assistant was required to provide a safe and appropriate environment for executing the surgery.  The assistant was integral in providing retraction, exposure, running suture providing suction and in the closing process.   A linear incision was then created along the medial border of the biceps muscle just proximal to the antecubital crease and the brachial artery which was exposed through. The brachial artery was then looped proximally and distally with  Silastic Vesseloops. Side branches were controlled with 4-0 silk ties.  Attention was then turned to the exposure of the axillary vein. Linear incision was then created medial to the proximal portion of the biceps at the level of the anterior axillary crease. The axillary vein was exposed and again looped proximally and distally with Silastic vessel loops. Associated tributaries were also controlled with Silastic Vesseloops.  The Gore tunneler was then delivered onto the field and a subcutaneous path was made from the arterial incision to the venous incision. A 4-7 tapered PTFE propatent graft by Simeon Craft was then pulled through the subcutaneous tunnel. The arterial 4 mm portion was then approximated to the brachial artery. Brachial artery was controlled proximally and distally with the Silastic Vesseloops. Arteriotomy was made with an 11 blade scalpel and extended with Potts scissors and a 6-0 Prolene stay suture was placed. End graft to side brachial artery anastomosis was then fashioned with running CV 6 suture. Flushing maneuvers were performed suture line was hemostatic and the graft was then assessed for proper position and ease of future cannulation. Heparinized saline was infused into the vein and the graft was clamped with a vascular clamp. With the graft pressurized it was approximated to the axillary vein in its native bed and then marked with a surgical marker. The vein was then delivered into the surgical field and controlled with the Silastic vessel loops. Venotomy was then made with an 11 blade scalpel and extended with Potts scissors and a 6-0 Prolene suture was used as stay suture. The the graft was then sewn to the vein in an end graft to side vein  fashion using running CV 6 suture.  Flushing maneuvers were performed and the artery was allowed to forward and back bleed.  Flow was then established through the AV graft  There was good  thrill in the venous outflow, and there was 1+ palpable radial  pulse.  At this point, I irrigated out the surgical wounds.  There was no further active bleeding.  The subcutaneous tissue was reapproximated with a running stitch of 3-0 Vicryl.  The skin was then reapproximated with a running subcuticular stitch of 4-0 Vicryl.  The skin was then cleaned, dried, and reinforced with Dermabond.    The patient tolerated this procedure well.   COMPLICATIONS: None  CONDITION: Carrie [name] [facility]  Office: [phone]   11/07/2018, 9:56 AM

## 2018-11-07 NOTE — H&P (Signed)
[facility] Admission History & Physical  MRN : [id number]  Carrie [name] is a 68 y.o. (November 05, 1950) female who presents with chief complaint of No chief complaint on file. [name]  History of Present Illness:    The patient is seen for evaluation of dialysis access.  The patient has a history of multiple failed accesses.  There have been accesses in both arms and in the thighs.    Current access is via a catheter which is functioning poorly.  There have been several episodes of catheter infection.  The patient denies amaurosis fugax or recent TIA symptoms. There are no recent neurological changes noted. The patient denies claudication symptoms or rest pain symptoms. The patient denies history of DVT, PE or superficial thrombophlebitis. The patient denies recent episodes of angina or shortness of breath.    Current Facility-Administered Medications  Medication Dose Route Frequency Provider Last Rate Last Dose  . 0.9 %  sodium chloride infusion   Intravenous Continuous [place], Amy, MD 10 mL/hr at 11/07/18 0653    . ceFAZolin (ANCEF) 1-4 GM/50ML-% IVPB           . ceFAZolin (ANCEF) IVPB 1 g/50 mL premix  1 g Intravenous On Call to [place], McLain, NP      . Chlorhexidine Gluconate Cloth 2 % PADS 6 each  6 each Topical Once Kris Hartmann, NP       And  . Chlorhexidine Gluconate Cloth 2 % PADS 6 each  6 each Topical Once Kris Hartmann, NP      . famotidine (PEPCID) 20 MG tablet             Past Medical History:  Diagnosis Date  . Allergy   . Anemia   . Arthritis    "right knee" (10/26'/2017)  . CKD (chronic kidney disease), stage III ([place])    12/2015 (Dr. Lavonia Dana)  . Depression   . History of blood transfusion 2008   "when I had brain tumor surgery"  . History of MRSA infection 2008  . Hypertension   . 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 Type II diabetes mellitus ([place]) 2011   on metformin until yr ago- no med now - off due to kidneys     Past Surgical History:  Procedure Laterality Date  . BRAIN MENINGIOMA EXCISION  08/2006   Foramina magnum meningioma  . CERVICAL DISCECTOMY  1996   Dr. Alric Seton  . CESAREAN SECTION  1981  . COLONOSCOPY    . DIALYSIS/PERMA CATHETER INSERTION N/A 08/04/2018   Procedure: DIALYSIS/PERMA CATHETER INSERTION;  Surgeon: Algernon Huxley, MD;  Location: [facility];  Service: Cardiovascular;  Laterality: N/A;  . DILATION AND CURETTAGE OF UTERUS    . JOINT REPLACEMENT     right knee left hip  . Right wrist x-ray  2007   Deg. at 1st MCP  . TOTAL HIP ARTHROPLASTY Left 03/27/2016   Procedure: LEFT TOTAL HIP ARTHROPLASTY ANTERIOR APPROACH;  Surgeon: Mcarthur Rossetti, MD;  Location: [facility];  Service: Orthopedics;  Laterality: Left;  . TOTAL KNEE ARTHROPLASTY Right 04/30/2017   Procedure: RIGHT TOTAL KNEE ARTHROPLASTY;  Surgeon: Mcarthur Rossetti, MD;  Location: [facility];  Service: Orthopedics;  Laterality: Right;  . TUBAL LIGATION  1981    Social History Social History   Tobacco Use  . Smoking status: Never Smoker  . Smokeless tobacco: Never Used  Substance Use Topics  . Alcohol use: No  Alcohol/week: 0.0 standard drinks  . Drug use: No    Family History Family History  Problem Relation Age of Onset  . Hypertension Other        Strong family history   . Breast cancer Mother 34  . Heart failure Sister   . Heart disease Sister     No family history of bleeding or clotting disorders, autoimmune disease or porphyria  Allergies  Allergen Reactions  . Naproxen Sodium Anaphylaxis and Swelling  . Sulfamethoxazole-Trimethoprim Anaphylaxis and Swelling     REVIEW OF SYSTEMS (Negative unless checked)  Constitutional: [] Weight loss  [] Fever  [] Chills Cardiac: [] Chest pain   [] Chest pressure   [] Palpitations   [] Shortness of breath when laying flat   [] Shortness of breath at rest   [x] Shortness of breath with exertion. Vascular:  [] Pain in legs with walking   [] Pain in  legs at rest   [] Pain in legs when laying flat   [] Claudication   [] Pain in feet when walking  [] Pain in feet at rest  [] Pain in feet when laying flat   [] History of DVT   [] Phlebitis   [] Swelling in legs   [] Varicose veins   [] Non-healing ulcers Pulmonary:   [] Uses home oxygen   [] Productive cough   [] Hemoptysis   [] Wheeze  [] COPD   [] Asthma Neurologic:  [] Dizziness  [] Blackouts   [] Seizures   [] History of stroke   [] History of TIA  [] Aphasia   [] Temporary blindness   [] Dysphagia   [] Weakness or numbness in arms   [] Weakness or numbness in legs Musculoskeletal:  [] Arthritis   [] Joint swelling   [] Joint pain   [] Low back pain Hematologic:  [] Easy bruising  [] Easy bleeding   [] Hypercoagulable state   [] Anemic  [] Hepatitis Gastrointestinal:  [] Blood in stool   [] Vomiting blood  [] Gastroesophageal reflux/heartburn   [] Difficulty swallowing. Genitourinary:  [x] Chronic kidney disease   [] Difficult urination  [] Frequent urination  [] Burning with urination   [] Blood in urine Skin:  [] Rashes   [] Ulcers   [] Wounds Psychological:  [] History of anxiety   []  History of major depression.  Physical Examination  Vitals:   11/07/18 0608  BP: (!) 143/79  Pulse: 65  Resp: 18  Temp: 97.7 F (36.5 C)  TempSrc: Temporal  SpO2: 99%  Weight: 107.5 kg  Height: 5\' 3"  (1.6 m)   Body mass index is 41.98 kg/m. Gen: WD/WN, NAD Head: [place]/AT, No temporalis wasting. Prominent temp pulse not noted. Ear/Nose/Throat: Hearing grossly intact, nares w/o erythema or drainage, oropharynx w/o Erythema/Exudate,  Eyes: Conjunctiva clear, sclera non-icteric Neck: Trachea midline.  No JVD.  Pulmonary:  Good air movement, respirations not labored, no use of accessory muscles.  Cardiac: RRR, normal S1, S2. Vascular: right IJ catheter CD&I Vessel Right Left  Radial Palpable Palpable  Ulnar Not Palpable Not Palpable  Brachial Palpable Palpable  Carotid Palpable, without bruit Palpable, without bruit  Gastrointestinal: soft,  non-tender/non-distended. No guarding/reflex.  Musculoskeletal: M/S 5/5 throughout.  Extremities without ischemic changes.  No deformity or atrophy.  Neurologic: Sensation grossly intact in extremities.  Symmetrical.  Speech is fluent. Motor exam as listed above. Psychiatric: Judgment intact, Mood & affect appropriate for pt's clinical situation. Dermatologic: No rashes or ulcers noted.  No cellulitis or open wounds. Lymph : No Cervical, Axillary, or Inguinal lymphadenopathy.   CBC Lab Results  Component Value Date   WBC 5.8 11/04/2018   HGB 13.3 11/07/2018   HCT 39.0 11/07/2018   MCV 93.4 11/04/2018   PLT 278 11/04/2018  BMET    Component Value Date/Time   NA 137 11/07/2018 0641   K 3.7 11/07/2018 0641   CL 99 11/04/2018 1208   CO2 28 11/04/2018 1208   GLUCOSE 109 (H) 11/07/2018 0641   BUN 36 (H) 11/04/2018 1208   BUN 36 (A) 07/08/2017   CREATININE 5.46 (H) 11/04/2018 1208   CALCIUM 9.0 11/04/2018 1208   GFRNONAA 7 (L) 11/04/2018 1208   GFRAA 9 (L) 11/04/2018 1208   Estimated Creatinine Clearance: 11.4 mL/min (A) (by C-G formula based on SCr of 5.46 mg/dL (H)).  COAG Lab Results  Component Value Date   INR 1.0 11/04/2018   INR 1.61 05/03/2017   INR 1.37 05/02/2017    Radiology No results found.  Assessment/Plan 1.  Complication dialysis device with thrombosis AV access:  Patient left arm will be used she may have vein for a basilic fistula but is is borderline size by ultrasound in it is not adequate a brachial axillary graft will be created The risks and benefits were described to the patient.  All questions were answered.  The patient agrees to proceed with angiography and intervention. Potassium will be drawn to ensure that it is an appropriate level prior to performing intervention. 2.  End-stage renal disease requiring hemodialysis:  Patient will continue dialysis therapy without further interruption if a successful intervention is not achieved then a tunneled  catheter will be placed. Dialysis has already been arranged. 3.  Hypertension:  Patient will continue medical management; nephrology is following no changes in oral medications. 4. Diabetes mellitus:  Glucose will be monitored and oral medications been held this morning once the patient has undergone the patient's procedure po intake will be reinitiated and again Accu-Cheks will be used to assess the blood glucose level and treat as needed. The patient will be restarted on the patient's usual hypoglycemic regime    [name], MD  11/07/2018 7:29 AM

## 2018-11-07 NOTE — OR Nursing (Signed)
Lab tech in for draw

## 2018-11-07 NOTE — Anesthesia Post-op Follow-up Note (Signed)
Anesthesia QCDR form completed.        

## 2018-11-07 NOTE — Discharge Instructions (Signed)

## 2018-11-07 NOTE — Anesthesia Preprocedure Evaluation (Signed)
Anesthesia Evaluation  Patient identified by MRN, date of birth, ID band Patient awake    Reviewed: Allergy & Precautions, H&P , NPO status , Patient's Chart, lab work & pertinent test results, reviewed documented beta blocker date and time   History of Anesthesia Complications Negative for: history of anesthetic complications  Airway Mallampati: III  TM Distance: >3 FB Neck ROM: full    Dental  (+) Dental Advidsory Given, Missing, Poor Dentition   Pulmonary neg pulmonary ROS,           Cardiovascular Exercise Tolerance: Good hypertension, (-) angina+CHF  (-) Past MI and (-) Cardiac Stents (-) dysrhythmias (-) Valvular Problems/Murmurs     Neuro/Psych PSYCHIATRIC DISORDERS Depression negative neurological ROS     GI/Hepatic negative GI ROS, Neg liver ROS,   Endo/Other  diabetesMorbid obesity  Renal/GU ESRF and DialysisRenal disease  negative genitourinary   Musculoskeletal   Abdominal   Peds  Hematology  (+) Blood dyscrasia, anemia ,   Anesthesia Other Findings Past Medical History: No date: Allergy No date: Anemia No date: Arthritis     Comment:  "right knee" (10/26'/2017) No date: CKD (chronic kidney disease), stage III ([place])     Comment:  12/2015 (Dr. Lavonia Dana) No date: Depression 2008: History of blood transfusion     Comment:  "when I had brain tumor surgery" 2008: History of MRSA infection No date: Hypertension No date: Meningioma [facility])     Comment:  Foramen magnum 2011: Type II diabetes mellitus ([place])     Comment:  on metformin until yr ago- no med now - off due to               kidneys   Reproductive/Obstetrics negative OB ROS                             Anesthesia Physical Anesthesia Plan  ASA: IV  Anesthesia Plan: General   Post-op Pain Management:    Induction: Intravenous  PONV Risk Score and Plan: 3 and Ondansetron, Dexamethasone and Treatment may vary  due to age or medical condition  Airway Management Planned: LMA and Oral ETT  Additional Equipment:   Intra-op Plan:   Post-operative Plan: Extubation in OR  Informed Consent: I have reviewed the patients History and Physical, chart, labs and discussed the procedure including the risks, benefits and alternatives for the proposed anesthesia with the patient or authorized representative who has indicated his/her understanding and acceptance.     Dental Advisory Given  Plan Discussed with: Anesthesiologist, CRNA and Surgeon  Anesthesia Plan Comments:         Anesthesia Quick Evaluation

## 2018-11-07 NOTE — Anesthesia Postprocedure Evaluation (Signed)
Anesthesia Post Note  Patient: Carrie [name]  Procedure(s) Performed: ARTERIOVENOUS (AV) FISTULA CREATION ( BRACHIO BASILIC ) VS BRACHIAL AXILLARY GRAFT (Left )  Patient location during evaluation: PACU Anesthesia Type: General Level of consciousness: awake and alert Pain management: pain level controlled Vital Signs Assessment: post-procedure vital signs reviewed and stable Respiratory status: spontaneous breathing, nonlabored ventilation, respiratory function stable and patient connected to nasal cannula oxygen Cardiovascular status: blood pressure returned to baseline and stable Postop Assessment: no apparent nausea or vomiting Anesthetic complications: no     Last Vitals:  Vitals:   11/07/18 1045 11/07/18 1129  BP: 137/76 (!) 106/58  Pulse: 68 64  Resp: 16 16  Temp: 36.5 C   SpO2: 93% 94%    Last Pain:  Vitals:   11/07/18 1129  TempSrc:   PainSc: 8                  Martha Clan

## 2018-11-07 NOTE — Anesthesia Procedure Notes (Signed)
Procedure Name: LMA Insertion Date/Time: 11/07/2018 7:50 AM Performed by: Chanetta Marshall, CRNA Pre-anesthesia Checklist: Patient identified, Patient being monitored, Suction available and Emergency Drugs available Patient Re-evaluated:Patient Re-evaluated prior to induction Oxygen Delivery Method: Circle system utilized Preoxygenation: Pre-oxygenation with 100% oxygen Induction Type: IV induction LMA: LMA inserted LMA Size: 4.0 Number of attempts: 1 Placement Confirmation: positive ETCO2,  CO2 detector and breath sounds checked- equal and bilateral Tube secured with: Tape Dental Injury: Teeth and Oropharynx as per pre-operative assessment

## 2018-11-07 NOTE — Transfer of Care (Signed)
Immediate Anesthesia Transfer of Care Note  Patient: Carrie [name]  Procedure(s) Performed: ARTERIOVENOUS (AV) FISTULA CREATION ( BRACHIO BASILIC ) VS BRACHIAL AXILLARY GRAFT (Left )  Patient Location: PACU  Anesthesia Type:General  Level of Consciousness: awake, alert  and oriented  Airway & Oxygen Therapy: Patient Spontanous Breathing and Patient connected to face mask oxygen  Post-op Assessment: Report given to RN and Post -op Vital signs reviewed and stable  Post vital signs: Reviewed and stable  Last Vitals:  Vitals Value Taken Time  BP 116/63 11/07/2018 10:06 AM  Temp    Pulse 70 11/07/2018 10:07 AM  Resp 11 11/07/2018 10:07 AM  SpO2 100 % 11/07/2018 10:07 AM  Vitals shown include unvalidated device data.  Last Pain:  Vitals:   11/07/18 0608  TempSrc: Temporal  PainSc: 0-No pain         Complications: No apparent anesthesia complications

## 2018-11-10 ENCOUNTER — Encounter: Payer: Self-pay | Admitting: Vascular Surgery

## 2018-11-10 DIAGNOSIS — N186 End stage renal disease: Secondary | ICD-10-CM | POA: Diagnosis not present

## 2018-11-10 DIAGNOSIS — Z992 Dependence on renal dialysis: Secondary | ICD-10-CM | POA: Diagnosis not present

## 2018-11-10 DIAGNOSIS — Z23 Encounter for immunization: Secondary | ICD-10-CM | POA: Diagnosis not present

## 2018-11-12 DIAGNOSIS — I132 Hypertensive heart and chronic kidney disease with heart failure and with stage 5 chronic kidney disease, or end stage renal disease: Secondary | ICD-10-CM | POA: Diagnosis not present

## 2018-11-12 DIAGNOSIS — I5032 Chronic diastolic (congestive) heart failure: Secondary | ICD-10-CM | POA: Diagnosis not present

## 2018-11-12 DIAGNOSIS — E785 Hyperlipidemia, unspecified: Secondary | ICD-10-CM | POA: Diagnosis not present

## 2018-11-12 DIAGNOSIS — N186 End stage renal disease: Secondary | ICD-10-CM | POA: Diagnosis not present

## 2018-11-12 DIAGNOSIS — E1122 Type 2 diabetes mellitus with diabetic chronic kidney disease: Secondary | ICD-10-CM | POA: Diagnosis not present

## 2018-11-12 DIAGNOSIS — Z992 Dependence on renal dialysis: Secondary | ICD-10-CM | POA: Diagnosis not present

## 2018-11-12 DIAGNOSIS — R569 Unspecified convulsions: Secondary | ICD-10-CM | POA: Diagnosis not present

## 2018-11-12 DIAGNOSIS — Z23 Encounter for immunization: Secondary | ICD-10-CM | POA: Diagnosis not present

## 2018-11-12 DIAGNOSIS — D631 Anemia in chronic kidney disease: Secondary | ICD-10-CM | POA: Diagnosis not present

## 2018-11-12 DIAGNOSIS — M1711 Unilateral primary osteoarthritis, right knee: Secondary | ICD-10-CM | POA: Diagnosis not present

## 2018-11-12 DIAGNOSIS — F329 Major depressive disorder, single episode, unspecified: Secondary | ICD-10-CM | POA: Diagnosis not present

## 2018-11-13 ENCOUNTER — Other Ambulatory Visit (INDEPENDENT_AMBULATORY_CARE_PROVIDER_SITE_OTHER): Payer: Self-pay | Admitting: Vascular Surgery

## 2018-11-13 DIAGNOSIS — N186 End stage renal disease: Secondary | ICD-10-CM

## 2018-11-13 DIAGNOSIS — Z9889 Other specified postprocedural states: Secondary | ICD-10-CM

## 2018-11-14 DIAGNOSIS — N186 End stage renal disease: Secondary | ICD-10-CM | POA: Diagnosis not present

## 2018-11-14 DIAGNOSIS — Z992 Dependence on renal dialysis: Secondary | ICD-10-CM | POA: Diagnosis not present

## 2018-11-14 DIAGNOSIS — Z23 Encounter for immunization: Secondary | ICD-10-CM | POA: Diagnosis not present

## 2018-11-17 DIAGNOSIS — N186 End stage renal disease: Secondary | ICD-10-CM | POA: Diagnosis not present

## 2018-11-17 DIAGNOSIS — Z992 Dependence on renal dialysis: Secondary | ICD-10-CM | POA: Diagnosis not present

## 2018-11-17 DIAGNOSIS — Z23 Encounter for immunization: Secondary | ICD-10-CM | POA: Diagnosis not present

## 2018-11-19 DIAGNOSIS — Z23 Encounter for immunization: Secondary | ICD-10-CM | POA: Diagnosis not present

## 2018-11-19 DIAGNOSIS — N186 End stage renal disease: Secondary | ICD-10-CM | POA: Diagnosis not present

## 2018-11-19 DIAGNOSIS — Z992 Dependence on renal dialysis: Secondary | ICD-10-CM | POA: Diagnosis not present

## 2018-11-20 ENCOUNTER — Ambulatory Visit (INDEPENDENT_AMBULATORY_CARE_PROVIDER_SITE_OTHER): Payer: Medicare HMO

## 2018-11-20 ENCOUNTER — Other Ambulatory Visit: Payer: Self-pay

## 2018-11-20 ENCOUNTER — Encounter (INDEPENDENT_AMBULATORY_CARE_PROVIDER_SITE_OTHER): Payer: Self-pay | Admitting: Vascular Surgery

## 2018-11-20 ENCOUNTER — Ambulatory Visit (INDEPENDENT_AMBULATORY_CARE_PROVIDER_SITE_OTHER): Payer: Medicare HMO | Admitting: Vascular Surgery

## 2018-11-20 VITALS — BP 139/77 | HR 82 | Resp 14 | Ht 63.0 in | Wt 247.0 lb

## 2018-11-20 DIAGNOSIS — Z9889 Other specified postprocedural states: Secondary | ICD-10-CM | POA: Diagnosis not present

## 2018-11-20 DIAGNOSIS — Z992 Dependence on renal dialysis: Secondary | ICD-10-CM | POA: Diagnosis not present

## 2018-11-20 DIAGNOSIS — N186 End stage renal disease: Secondary | ICD-10-CM

## 2018-11-20 DIAGNOSIS — T829XXA Unspecified complication of cardiac and vascular prosthetic device, implant and graft, initial encounter: Secondary | ICD-10-CM

## 2018-11-20 NOTE — Progress Notes (Signed)
Patient ID: KORINNA TAT, female   DOB: 01-29-1951, 68 y.o.   MRN: [id number]  Chief Complaint  Patient presents with  . Follow-up    HPI HIBAH ODONNELL is a 68 y.o. female.    Left arm still "a little tender".  Denies hand pain.  No fever or chills, no drainage   Past Medical History:  Diagnosis Date  . Allergy   . Anemia   . Arthritis    "right knee" (10/26'/2017)  . CKD (chronic kidney disease), stage III ([place])    12/2015 (Dr. Lavonia Dana)  . Depression   . History of blood transfusion 2008   "when I had brain tumor surgery"  . History of MRSA infection 2008  . Hypertension   . 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 Type II diabetes mellitus ([place]) 2011   on metformin until yr ago- no med now - off due to kidneys    Past Surgical History:  Procedure Laterality Date  . AV FISTULA PLACEMENT Left 11/07/2018   Procedure: ARTERIOVENOUS (AV) FISTULA CREATION ( BRACHIO BASILIC ) VS BRACHIAL AXILLARY GRAFT;  Surgeon: Katha Cabal, MD;  Location: [facility];  Service: Vascular;  Laterality: Left;  . BRAIN MENINGIOMA EXCISION  08/2006   Foramina magnum meningioma  . CERVICAL DISCECTOMY  1996   Dr. Alric Seton  . CESAREAN SECTION  1981  . COLONOSCOPY    . DIALYSIS/PERMA CATHETER INSERTION N/A 08/04/2018   Procedure: DIALYSIS/PERMA CATHETER INSERTION;  Surgeon: Algernon Huxley, MD;  Location: [facility];  Service: Cardiovascular;  Laterality: N/A;  . DILATION AND CURETTAGE OF UTERUS    . JOINT REPLACEMENT     right knee left hip  . Right wrist x-ray  2007   Deg. at 1st MCP  . TOTAL HIP ARTHROPLASTY Left 03/27/2016   Procedure: LEFT TOTAL HIP ARTHROPLASTY ANTERIOR APPROACH;  Surgeon: Mcarthur Rossetti, MD;  Location: [facility];  Service: Orthopedics;  Laterality: Left;  . TOTAL KNEE ARTHROPLASTY Right 04/30/2017   Procedure: RIGHT TOTAL KNEE ARTHROPLASTY;  Surgeon: Mcarthur Rossetti, MD;  Location: [facility];  Service: Orthopedics;  Laterality: Right;  .  TUBAL LIGATION  1981      Allergies  Allergen Reactions  . Naproxen Sodium Anaphylaxis and Swelling  . Sulfamethoxazole-Trimethoprim Anaphylaxis and Swelling    Current Outpatient Medications  Medication Sig Dispense Refill  . atorvastatin (LIPITOR) 40 MG tablet TAKE 40 MG BY MOUTH ONCE DAILY (Patient taking differently: Take 40 mg by mouth daily. ) 90 tablet 3  . HYDROcodone-acetaminophen (NORCO) 5-325 MG tablet Take 1-2 tablets by mouth every 6 (six) hours as needed for moderate pain or severe pain. 40 tablet 0  . levETIRAcetam (KEPPRA) 1000 MG tablet TAKE ONE TABLET BY MOUTH TWICE DAILY (Patient taking differently: Take 1,000 mg by mouth 2 (two) times daily. ) 180 tablet 3  . LORazepam (ATIVAN) 0.5 MG tablet PLEASE SEE ATTACHED FOR DETAILED DIRECTIONS (Patient taking differently: Take 0.5 mg by mouth 2 (two) times daily as needed. ) 60 tablet 0  . losartan (COZAAR) 50 MG tablet Take 50 mg by mouth daily.    . metoprolol succinate (TOPROL-XL) 25 MG 24 hr tablet Take 1 tablet (25 mg total) by mouth daily. 30 tablet 0  . multivitamin (RENA-VIT) TABS tablet Take 1 tablet by mouth at bedtime. 30 tablet 0  . sevelamer carbonate (RENVELA) 800 MG tablet     . torsemide (DEMADEX) 10 MG tablet Take 10 mg  by mouth daily.    [name] amLODipine (NORVASC) 10 MG tablet TAKE 1 TABLET BY MOUTH EVERY DAY (Patient not taking: No sig reported) 90 tablet 3   No current facility-administered medications for this visit.         Physical Exam BP 139/77 (BP Location: Right Wrist, Patient Position: Sitting, Cuff Size: Normal)   Pulse 82   Resp 14   Ht 5\' 3"  (1.6 m)   Wt 247 lb (112 kg)   BMI 43.75 kg/m  Gen:  WD/WN, NAD Skin: incision C/D/I, healing well,  AV graft palpable good thrill good bruit     Assessment/Plan: 1. ESRD (end stage renal disease) ([place]) Continue HD without interruption, begin cannulation December 08, 2018  2. Complication from renal dialysis device, initial encounter Recommend:   The patient is doing well and currently has adequate dialysis access.  OK to begin cannulation on December 08, 2018.  The patient's dialysis center is not reporting any access issues with her present catheter.  Flow pattern of the brachial axillary graft is stable.  The patient should have a duplex ultrasound of the dialysis access in 6 months. The patient will follow-up with me in the office after each ultrasound    - VAS [place] (AVF, AVG); Future      [name] 11/20/2018, 4:43 PM   This note was created with Dragon medical transcription system.  Any errors from dictation are unintentional.

## 2018-11-21 DIAGNOSIS — M1711 Unilateral primary osteoarthritis, right knee: Secondary | ICD-10-CM | POA: Diagnosis not present

## 2018-11-21 DIAGNOSIS — I132 Hypertensive heart and chronic kidney disease with heart failure and with stage 5 chronic kidney disease, or end stage renal disease: Secondary | ICD-10-CM | POA: Diagnosis not present

## 2018-11-21 DIAGNOSIS — E1122 Type 2 diabetes mellitus with diabetic chronic kidney disease: Secondary | ICD-10-CM | POA: Diagnosis not present

## 2018-11-21 DIAGNOSIS — D631 Anemia in chronic kidney disease: Secondary | ICD-10-CM | POA: Diagnosis not present

## 2018-11-21 DIAGNOSIS — F329 Major depressive disorder, single episode, unspecified: Secondary | ICD-10-CM | POA: Diagnosis not present

## 2018-11-21 DIAGNOSIS — Z992 Dependence on renal dialysis: Secondary | ICD-10-CM | POA: Diagnosis not present

## 2018-11-21 DIAGNOSIS — I5032 Chronic diastolic (congestive) heart failure: Secondary | ICD-10-CM | POA: Diagnosis not present

## 2018-11-21 DIAGNOSIS — E785 Hyperlipidemia, unspecified: Secondary | ICD-10-CM | POA: Diagnosis not present

## 2018-11-21 DIAGNOSIS — R569 Unspecified convulsions: Secondary | ICD-10-CM | POA: Diagnosis not present

## 2018-11-21 DIAGNOSIS — Z23 Encounter for immunization: Secondary | ICD-10-CM | POA: Diagnosis not present

## 2018-11-21 DIAGNOSIS — N186 End stage renal disease: Secondary | ICD-10-CM | POA: Diagnosis not present

## 2018-11-24 DIAGNOSIS — Z992 Dependence on renal dialysis: Secondary | ICD-10-CM | POA: Diagnosis not present

## 2018-11-24 DIAGNOSIS — N186 End stage renal disease: Secondary | ICD-10-CM | POA: Diagnosis not present

## 2018-11-24 DIAGNOSIS — Z23 Encounter for immunization: Secondary | ICD-10-CM | POA: Diagnosis not present

## 2018-11-26 DIAGNOSIS — Z23 Encounter for immunization: Secondary | ICD-10-CM | POA: Diagnosis not present

## 2018-11-26 DIAGNOSIS — E785 Hyperlipidemia, unspecified: Secondary | ICD-10-CM | POA: Diagnosis not present

## 2018-11-26 DIAGNOSIS — Z992 Dependence on renal dialysis: Secondary | ICD-10-CM | POA: Diagnosis not present

## 2018-11-26 DIAGNOSIS — D631 Anemia in chronic kidney disease: Secondary | ICD-10-CM | POA: Diagnosis not present

## 2018-11-26 DIAGNOSIS — F329 Major depressive disorder, single episode, unspecified: Secondary | ICD-10-CM | POA: Diagnosis not present

## 2018-11-26 DIAGNOSIS — R569 Unspecified convulsions: Secondary | ICD-10-CM | POA: Diagnosis not present

## 2018-11-26 DIAGNOSIS — N186 End stage renal disease: Secondary | ICD-10-CM | POA: Diagnosis not present

## 2018-11-26 DIAGNOSIS — M1711 Unilateral primary osteoarthritis, right knee: Secondary | ICD-10-CM | POA: Diagnosis not present

## 2018-11-26 DIAGNOSIS — E1122 Type 2 diabetes mellitus with diabetic chronic kidney disease: Secondary | ICD-10-CM | POA: Diagnosis not present

## 2018-11-26 DIAGNOSIS — I132 Hypertensive heart and chronic kidney disease with heart failure and with stage 5 chronic kidney disease, or end stage renal disease: Secondary | ICD-10-CM | POA: Diagnosis not present

## 2018-11-26 DIAGNOSIS — I5032 Chronic diastolic (congestive) heart failure: Secondary | ICD-10-CM | POA: Diagnosis not present

## 2018-11-28 DIAGNOSIS — Z23 Encounter for immunization: Secondary | ICD-10-CM | POA: Diagnosis not present

## 2018-11-28 DIAGNOSIS — N186 End stage renal disease: Secondary | ICD-10-CM | POA: Diagnosis not present

## 2018-11-28 DIAGNOSIS — Z992 Dependence on renal dialysis: Secondary | ICD-10-CM | POA: Diagnosis not present

## 2018-12-01 ENCOUNTER — Other Ambulatory Visit: Payer: Self-pay | Admitting: Internal Medicine

## 2018-12-01 DIAGNOSIS — Z23 Encounter for immunization: Secondary | ICD-10-CM | POA: Diagnosis not present

## 2018-12-01 DIAGNOSIS — N186 End stage renal disease: Secondary | ICD-10-CM | POA: Diagnosis not present

## 2018-12-01 DIAGNOSIS — Z992 Dependence on renal dialysis: Secondary | ICD-10-CM | POA: Diagnosis not present

## 2018-12-02 DIAGNOSIS — Z992 Dependence on renal dialysis: Secondary | ICD-10-CM | POA: Diagnosis not present

## 2018-12-02 DIAGNOSIS — N186 End stage renal disease: Secondary | ICD-10-CM | POA: Diagnosis not present

## 2018-12-02 NOTE — Telephone Encounter (Signed)
Last filled 10-09-18 #60 Last OV 06-23-18 Next OV 12-24-18 [facility]

## 2018-12-03 DIAGNOSIS — N186 End stage renal disease: Secondary | ICD-10-CM | POA: Diagnosis not present

## 2018-12-03 DIAGNOSIS — Z992 Dependence on renal dialysis: Secondary | ICD-10-CM | POA: Diagnosis not present

## 2018-12-04 DIAGNOSIS — D631 Anemia in chronic kidney disease: Secondary | ICD-10-CM | POA: Diagnosis not present

## 2018-12-04 DIAGNOSIS — R569 Unspecified convulsions: Secondary | ICD-10-CM | POA: Diagnosis not present

## 2018-12-04 DIAGNOSIS — M1711 Unilateral primary osteoarthritis, right knee: Secondary | ICD-10-CM | POA: Diagnosis not present

## 2018-12-04 DIAGNOSIS — E785 Hyperlipidemia, unspecified: Secondary | ICD-10-CM | POA: Diagnosis not present

## 2018-12-04 DIAGNOSIS — I132 Hypertensive heart and chronic kidney disease with heart failure and with stage 5 chronic kidney disease, or end stage renal disease: Secondary | ICD-10-CM | POA: Diagnosis not present

## 2018-12-04 DIAGNOSIS — N186 End stage renal disease: Secondary | ICD-10-CM | POA: Diagnosis not present

## 2018-12-04 DIAGNOSIS — E1122 Type 2 diabetes mellitus with diabetic chronic kidney disease: Secondary | ICD-10-CM | POA: Diagnosis not present

## 2018-12-04 DIAGNOSIS — F329 Major depressive disorder, single episode, unspecified: Secondary | ICD-10-CM | POA: Diagnosis not present

## 2018-12-04 DIAGNOSIS — I5032 Chronic diastolic (congestive) heart failure: Secondary | ICD-10-CM | POA: Diagnosis not present

## 2018-12-05 DIAGNOSIS — Z992 Dependence on renal dialysis: Secondary | ICD-10-CM | POA: Diagnosis not present

## 2018-12-05 DIAGNOSIS — N186 End stage renal disease: Secondary | ICD-10-CM | POA: Diagnosis not present

## 2018-12-07 DIAGNOSIS — I5032 Chronic diastolic (congestive) heart failure: Secondary | ICD-10-CM | POA: Diagnosis not present

## 2018-12-08 DIAGNOSIS — N186 End stage renal disease: Secondary | ICD-10-CM | POA: Diagnosis not present

## 2018-12-08 DIAGNOSIS — Z992 Dependence on renal dialysis: Secondary | ICD-10-CM | POA: Diagnosis not present

## 2018-12-10 DIAGNOSIS — Z992 Dependence on renal dialysis: Secondary | ICD-10-CM | POA: Diagnosis not present

## 2018-12-10 DIAGNOSIS — N186 End stage renal disease: Secondary | ICD-10-CM | POA: Diagnosis not present

## 2018-12-11 DIAGNOSIS — M1711 Unilateral primary osteoarthritis, right knee: Secondary | ICD-10-CM | POA: Diagnosis not present

## 2018-12-11 DIAGNOSIS — E785 Hyperlipidemia, unspecified: Secondary | ICD-10-CM | POA: Diagnosis not present

## 2018-12-11 DIAGNOSIS — Z992 Dependence on renal dialysis: Secondary | ICD-10-CM | POA: Diagnosis not present

## 2018-12-11 DIAGNOSIS — I132 Hypertensive heart and chronic kidney disease with heart failure and with stage 5 chronic kidney disease, or end stage renal disease: Secondary | ICD-10-CM | POA: Diagnosis not present

## 2018-12-11 DIAGNOSIS — I5032 Chronic diastolic (congestive) heart failure: Secondary | ICD-10-CM | POA: Diagnosis not present

## 2018-12-11 DIAGNOSIS — R569 Unspecified convulsions: Secondary | ICD-10-CM | POA: Diagnosis not present

## 2018-12-11 DIAGNOSIS — E1122 Type 2 diabetes mellitus with diabetic chronic kidney disease: Secondary | ICD-10-CM | POA: Diagnosis not present

## 2018-12-11 DIAGNOSIS — N186 End stage renal disease: Secondary | ICD-10-CM | POA: Diagnosis not present

## 2018-12-12 DIAGNOSIS — N186 End stage renal disease: Secondary | ICD-10-CM | POA: Diagnosis not present

## 2018-12-12 DIAGNOSIS — Z992 Dependence on renal dialysis: Secondary | ICD-10-CM | POA: Diagnosis not present

## 2018-12-13 DIAGNOSIS — R569 Unspecified convulsions: Secondary | ICD-10-CM | POA: Diagnosis not present

## 2018-12-13 DIAGNOSIS — I5032 Chronic diastolic (congestive) heart failure: Secondary | ICD-10-CM | POA: Diagnosis not present

## 2018-12-13 DIAGNOSIS — E785 Hyperlipidemia, unspecified: Secondary | ICD-10-CM | POA: Diagnosis not present

## 2018-12-13 DIAGNOSIS — N186 End stage renal disease: Secondary | ICD-10-CM | POA: Diagnosis not present

## 2018-12-13 DIAGNOSIS — M1711 Unilateral primary osteoarthritis, right knee: Secondary | ICD-10-CM

## 2018-12-13 DIAGNOSIS — I132 Hypertensive heart and chronic kidney disease with heart failure and with stage 5 chronic kidney disease, or end stage renal disease: Secondary | ICD-10-CM | POA: Diagnosis not present

## 2018-12-13 DIAGNOSIS — Z992 Dependence on renal dialysis: Secondary | ICD-10-CM | POA: Diagnosis not present

## 2018-12-13 DIAGNOSIS — E1122 Type 2 diabetes mellitus with diabetic chronic kidney disease: Secondary | ICD-10-CM | POA: Diagnosis not present

## 2018-12-15 DIAGNOSIS — N186 End stage renal disease: Secondary | ICD-10-CM | POA: Diagnosis not present

## 2018-12-15 DIAGNOSIS — Z992 Dependence on renal dialysis: Secondary | ICD-10-CM | POA: Diagnosis not present

## 2018-12-17 DIAGNOSIS — N186 End stage renal disease: Secondary | ICD-10-CM | POA: Diagnosis not present

## 2018-12-17 DIAGNOSIS — Z992 Dependence on renal dialysis: Secondary | ICD-10-CM | POA: Diagnosis not present

## 2018-12-18 DIAGNOSIS — N186 End stage renal disease: Secondary | ICD-10-CM | POA: Diagnosis not present

## 2018-12-18 DIAGNOSIS — I132 Hypertensive heart and chronic kidney disease with heart failure and with stage 5 chronic kidney disease, or end stage renal disease: Secondary | ICD-10-CM | POA: Diagnosis not present

## 2018-12-18 DIAGNOSIS — Z992 Dependence on renal dialysis: Secondary | ICD-10-CM | POA: Diagnosis not present

## 2018-12-18 DIAGNOSIS — M1711 Unilateral primary osteoarthritis, right knee: Secondary | ICD-10-CM | POA: Diagnosis not present

## 2018-12-18 DIAGNOSIS — E785 Hyperlipidemia, unspecified: Secondary | ICD-10-CM | POA: Diagnosis not present

## 2018-12-18 DIAGNOSIS — R569 Unspecified convulsions: Secondary | ICD-10-CM | POA: Diagnosis not present

## 2018-12-18 DIAGNOSIS — E1122 Type 2 diabetes mellitus with diabetic chronic kidney disease: Secondary | ICD-10-CM | POA: Diagnosis not present

## 2018-12-18 DIAGNOSIS — I5032 Chronic diastolic (congestive) heart failure: Secondary | ICD-10-CM | POA: Diagnosis not present

## 2018-12-19 ENCOUNTER — Telehealth (INDEPENDENT_AMBULATORY_CARE_PROVIDER_SITE_OTHER): Payer: Self-pay

## 2018-12-19 DIAGNOSIS — Z992 Dependence on renal dialysis: Secondary | ICD-10-CM | POA: Diagnosis not present

## 2018-12-19 DIAGNOSIS — N186 End stage renal disease: Secondary | ICD-10-CM | POA: Diagnosis not present

## 2018-12-19 NOTE — Telephone Encounter (Signed)
A fax was received for the patient to have a permcath removal. Patient is scheduled with Dr. Lucky Cowboy for 12/25/2018 with a 11:00 am arrival time and Covid testing on 12/22/2018 between 12:30-2:30 pm. This information was faxed back to [facility].

## 2018-12-21 ENCOUNTER — Other Ambulatory Visit (INDEPENDENT_AMBULATORY_CARE_PROVIDER_SITE_OTHER): Payer: Self-pay | Admitting: Nurse Practitioner

## 2018-12-22 ENCOUNTER — Other Ambulatory Visit
Admission: RE | Admit: 2018-12-22 | Discharge: 2018-12-22 | Disposition: A | Discharge status: Home / Self Care | Payer: Medicare HMO | Source: Ambulatory Visit | Attending: Vascular Surgery | Admitting: Vascular Surgery

## 2018-12-22 ENCOUNTER — Other Ambulatory Visit: Payer: Self-pay

## 2018-12-22 DIAGNOSIS — Z1159 Encounter for screening for other viral diseases: Secondary | ICD-10-CM | POA: Diagnosis not present

## 2018-12-22 DIAGNOSIS — Z992 Dependence on renal dialysis: Secondary | ICD-10-CM | POA: Diagnosis not present

## 2018-12-22 DIAGNOSIS — N186 End stage renal disease: Secondary | ICD-10-CM | POA: Diagnosis not present

## 2018-12-23 DIAGNOSIS — Z992 Dependence on renal dialysis: Secondary | ICD-10-CM | POA: Diagnosis not present

## 2018-12-23 DIAGNOSIS — R569 Unspecified convulsions: Secondary | ICD-10-CM | POA: Diagnosis not present

## 2018-12-23 DIAGNOSIS — M1711 Unilateral primary osteoarthritis, right knee: Secondary | ICD-10-CM | POA: Diagnosis not present

## 2018-12-23 DIAGNOSIS — N186 End stage renal disease: Secondary | ICD-10-CM | POA: Diagnosis not present

## 2018-12-23 DIAGNOSIS — E785 Hyperlipidemia, unspecified: Secondary | ICD-10-CM | POA: Diagnosis not present

## 2018-12-23 DIAGNOSIS — I5032 Chronic diastolic (congestive) heart failure: Secondary | ICD-10-CM | POA: Diagnosis not present

## 2018-12-23 DIAGNOSIS — E1122 Type 2 diabetes mellitus with diabetic chronic kidney disease: Secondary | ICD-10-CM | POA: Diagnosis not present

## 2018-12-23 DIAGNOSIS — I132 Hypertensive heart and chronic kidney disease with heart failure and with stage 5 chronic kidney disease, or end stage renal disease: Secondary | ICD-10-CM | POA: Diagnosis not present

## 2018-12-23 LAB — SARS CORONAVIRUS 2 (TAT 6-24 HRS): SARS Coronavirus 2: NEGATIVE

## 2018-12-24 ENCOUNTER — Encounter: Payer: Medicare HMO | Admitting: Internal Medicine

## 2018-12-24 DIAGNOSIS — Z992 Dependence on renal dialysis: Secondary | ICD-10-CM | POA: Diagnosis not present

## 2018-12-24 DIAGNOSIS — N186 End stage renal disease: Secondary | ICD-10-CM | POA: Diagnosis not present

## 2018-12-24 MED ORDER — CEFAZOLIN SODIUM-DEXTROSE 1-4 GM/50ML-% IV SOLN: 1.0000 g | Freq: Once | INTRAVENOUS | Status: DC

## 2018-12-25 ENCOUNTER — Other Ambulatory Visit: Payer: Self-pay

## 2018-12-25 ENCOUNTER — Ambulatory Visit
Admission: RE | Admit: 2018-12-25 | Discharge: 2018-12-25 | Disposition: A | Discharge status: Home / Self Care | Payer: Medicare HMO | Attending: Vascular Surgery | Admitting: Vascular Surgery

## 2018-12-25 ENCOUNTER — Encounter: Payer: Self-pay | Admitting: Vascular Surgery

## 2018-12-25 ENCOUNTER — Encounter
Admission: RE | Disposition: A | Discharge status: Home / Self Care | Payer: Self-pay | Source: Home / Self Care | Attending: Vascular Surgery

## 2018-12-25 DIAGNOSIS — M1990 Unspecified osteoarthritis, unspecified site: Secondary | ICD-10-CM | POA: Diagnosis not present

## 2018-12-25 DIAGNOSIS — Z992 Dependence on renal dialysis: Secondary | ICD-10-CM | POA: Diagnosis not present

## 2018-12-25 DIAGNOSIS — I120 Hypertensive chronic kidney disease with stage 5 chronic kidney disease or end stage renal disease: Secondary | ICD-10-CM | POA: Diagnosis not present

## 2018-12-25 DIAGNOSIS — N186 End stage renal disease: Secondary | ICD-10-CM | POA: Insufficient documentation

## 2018-12-25 DIAGNOSIS — E1122 Type 2 diabetes mellitus with diabetic chronic kidney disease: Secondary | ICD-10-CM | POA: Insufficient documentation

## 2018-12-25 DIAGNOSIS — F329 Major depressive disorder, single episode, unspecified: Secondary | ICD-10-CM | POA: Insufficient documentation

## 2018-12-25 DIAGNOSIS — Z452 Encounter for adjustment and management of vascular access device: Secondary | ICD-10-CM | POA: Insufficient documentation

## 2018-12-25 HISTORY — PX: DIALYSIS/PERMA CATHETER REMOVAL: CATH118289

## 2018-12-25 SURGERY — DIALYSIS/PERMA CATHETER REMOVAL
Anesthesia: LOCAL

## 2018-12-25 MED ORDER — SODIUM CHLORIDE 0.9 % IV SOLN: INTRAVENOUS | Status: DC

## 2018-12-25 MED ORDER — HYDROMORPHONE HCL 1 MG/ML IJ SOLN: 1.0000 mg | Freq: Once | INTRAMUSCULAR | Status: DC | PRN

## 2018-12-25 MED ORDER — ONDANSETRON HCL 4 MG/2ML IJ SOLN: 4.0000 mg | Freq: Four times a day (QID) | INTRAMUSCULAR | Status: DC | PRN

## 2018-12-25 MED ORDER — LIDOCAINE-EPINEPHRINE (PF) 1 %-1:200000 IJ SOLN
INTRAMUSCULAR | Status: DC | PRN
  Administered 2018-12-25: 10 mL

## 2018-12-25 SURGICAL SUPPLY — 4 items
APL PRP STRL LF DISP 70% ISPRP (MISCELLANEOUS) ×1
CHLORAPREP W/TINT 26 (MISCELLANEOUS) ×1 IMPLANT
FORCEPS HALSTEAD CVD 5IN STRL (INSTRUMENTS) ×1 IMPLANT
TRAY LACERAT/PLASTIC (MISCELLANEOUS) ×1 IMPLANT

## 2018-12-25 NOTE — Op Note (Signed)
Operative Note     Preoperative diagnosis:   1. ESRD with functional permanent access  Postoperative diagnosis:  1. ESRD with functional permanent access  Procedure:  Removal of right jugular Permcath  Surgeon:  Leotis Pain, MD  Anesthesia:  Local  EBL:  Minimal  Indication for the Procedure:  The patient has a functional permanent dialysis access and no longer needs their permcath.  This can be removed.  Risks and benefits are discussed and informed consent is obtained.  Description of the Procedure:  The patient's right neck, chest and existing catheter were sterilely prepped and draped. The area around the catheter was anesthetized copiously with 1% lidocaine. The catheter was dissected out with curved hemostats until the cuff was freed from the surrounding fibrous sheath. The fiber sheath was transected, and the catheter was then removed in its entirety using gentle traction. Pressure was held and sterile dressings were placed. The patient tolerated the procedure well and was taken to the recovery room in stable condition.     Leotis Pain  12/25/2018, 11:36 AM This note was created with Dragon Medical transcription system. Any errors in dictation are purely unintentional.

## 2018-12-25 NOTE — H&P (Signed)
[facility] Admission History & Physical  MRN : [id number]  Carrie [name] is a 68 y.o. (17-Mar-1951) female who presents with chief complaint of No chief complaint on file. [name]  History of Present Illness: I am asked to evaluate the patient by the dialysis center. The patient was sent here because they have a nonfunctioning tunneled catheter and a functioning arm access.  The patient reports they're not been any problems with any of their dialysis runs. They are reporting good flows with good parameters at dialysis.  Patient denies pain or tenderness overlying the access.  There is no pain with dialysis.  The patient denies hand pain or finger pain consistent with steal syndrome.  No fevers or chills while on dialysis.   No current facility-administered medications for this encounter.     Past Medical History:  Diagnosis Date  . Allergy   . Anemia   . Arthritis    "right knee" (10/26'/2017)  . CKD (chronic kidney disease), stage III ([place])    12/2015 (Dr. Lavonia Dana)  . Depression   . History of blood transfusion 2008   "when I had brain tumor surgery"  . History of MRSA infection 2008  . Hypertension   . 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 Type II diabetes mellitus ([place]) 2011   on metformin until yr ago- no med now - off due to kidneys    Past Surgical History:  Procedure Laterality Date  . AV FISTULA PLACEMENT Left 11/07/2018   Procedure: ARTERIOVENOUS (AV) FISTULA CREATION ( BRACHIO BASILIC ) VS BRACHIAL AXILLARY GRAFT;  Surgeon: Katha Cabal, MD;  Location: [facility];  Service: Vascular;  Laterality: Left;  . BRAIN MENINGIOMA EXCISION  08/2006   Foramina magnum meningioma  . CERVICAL DISCECTOMY  1996   Dr. Alric Seton  . CESAREAN SECTION  1981  . COLONOSCOPY    . DIALYSIS/PERMA CATHETER INSERTION N/A 08/04/2018   Procedure: DIALYSIS/PERMA CATHETER INSERTION;  Surgeon: Algernon Huxley, MD;  Location: [facility];  Service:  Cardiovascular;  Laterality: N/A;  . DILATION AND CURETTAGE OF UTERUS    . JOINT REPLACEMENT     right knee left hip  . Right wrist x-ray  2007   Deg. at 1st MCP  . TOTAL HIP ARTHROPLASTY Left 03/27/2016   Procedure: LEFT TOTAL HIP ARTHROPLASTY ANTERIOR APPROACH;  Surgeon: Mcarthur Rossetti, MD;  Location: [place];  Service: Orthopedics;  Laterality: Left;  . TOTAL KNEE ARTHROPLASTY Right 04/30/2017   Procedure: RIGHT TOTAL KNEE ARTHROPLASTY;  Surgeon: Mcarthur Rossetti, MD;  Location: [place];  Service: Orthopedics;  Laterality: Right;  . TUBAL LIGATION  1981    Social History Social History   Tobacco Use  . Smoking status: Never Smoker  . Smokeless tobacco: Never Used  Substance Use Topics  . Alcohol use: No    Alcohol/week: 0.0 standard drinks  . Drug use: No    Family History Family History  Problem Relation Age of Onset  . Hypertension Other        Strong family history   . Breast cancer Mother 51  . Heart failure Sister   . Heart disease Sister     No family history of bleeding or clotting disorders, autoimmune disease or porphyria  Allergies  Allergen Reactions  . Naproxen Sodium Anaphylaxis and Swelling  . Sulfamethoxazole-Trimethoprim Anaphylaxis and Swelling     REVIEW OF SYSTEMS (Negative unless checked)  Constitutional: [] Weight loss  [] Fever  [] Chills  Cardiac: [] Chest pain   [] Chest pressure   [] Palpitations   [] Shortness of breath when laying flat   [] Shortness of breath at rest   [x] Shortness of breath with exertion. Vascular:  [] Pain in legs with walking   [] Pain in legs at rest   [] Pain in legs when laying flat   [] Claudication   [] Pain in feet when walking  [] Pain in feet at rest  [] Pain in feet when laying flat   [] History of DVT   [] Phlebitis   [] Swelling in legs   [] Varicose veins   [] Non-healing ulcers Pulmonary:   [] Uses home oxygen   [] Productive cough   [] Hemoptysis   [] Wheeze  [] COPD   [] Asthma Neurologic:  [] Dizziness  [] Blackouts    [] Seizures   [] History of stroke   [] History of TIA  [] Aphasia   [] Temporary blindness   [] Dysphagia   [] Weakness or numbness in arms   [] Weakness or numbness in legs Musculoskeletal:  [x] Arthritis   [] Joint swelling   [] Joint pain   [] Low back pain Hematologic:  [] Easy bruising  [] Easy bleeding   [] Hypercoagulable state   [x] Anemic  [] Hepatitis Gastrointestinal:  [] Blood in stool   [] Vomiting blood  [] Gastroesophageal reflux/heartburn   [] Difficulty swallowing. Genitourinary:  [x] Chronic kidney disease   [] Difficult urination  [] Frequent urination  [] Burning with urination   [] Blood in urine Skin:  [] Rashes   [] Ulcers   [] Wounds Psychological:  [] History of anxiety   []  History of major depression.  Physical Examination  Vitals:   12/25/18 0946  BP: 133/71  Pulse: 76  Resp: 14  Temp: 98.3 F (36.8 C)  TempSrc: Oral  SpO2: 98%   There is no height or weight on file to calculate BMI. Gen: WD/WN, NAD Head: [place]/AT, No temporalis wasting.  Ear/Nose/Throat: Hearing grossly intact, nares w/o erythema or drainage, oropharynx w/o Erythema/Exudate,  Eyes: Conjunctiva clear, sclera non-icteric Neck: Trachea midline.  No JVD.  Pulmonary:  Good air movement, respirations not labored, no use of accessory muscles.  Cardiac: RRR, normal S1, S2. Vascular: good thrill left arm AVG Vessel Right Left  Radial Palpable Palpable   Musculoskeletal: M/S 5/5 throughout.  Extremities without ischemic changes.  No deformity or atrophy.  Neurologic: Sensation grossly intact in extremities.  Symmetrical.  Speech is fluent. Motor exam as listed above. Psychiatric: Judgment intact, Mood & affect appropriate for pt's clinical situation. Dermatologic: No rashes or ulcers noted.  No cellulitis or open wounds.    CBC Lab Results  Component Value Date   WBC 5.8 11/04/2018   HGB 13.3 11/07/2018   HCT 39.0 11/07/2018   MCV 93.4 11/04/2018   PLT 278 11/04/2018    BMET    Component Value Date/Time   NA  137 11/07/2018 0641   K 3.7 11/07/2018 0641   CL 99 11/04/2018 1208   CO2 28 11/04/2018 1208   GLUCOSE 109 (H) 11/07/2018 0641   BUN 36 (H) 11/04/2018 1208   BUN 36 (A) 07/08/2017   CREATININE 5.46 (H) 11/04/2018 1208   CALCIUM 9.0 11/04/2018 1208   GFRNONAA 7 (L) 11/04/2018 1208   GFRAA 9 (L) 11/04/2018 1208   CrCl cannot be calculated (Patient's most recent lab result is older than the maximum 21 days allowed.).  COAG Lab Results  Component Value Date   INR 1.0 11/04/2018   INR 1.61 05/03/2017   INR 1.37 05/02/2017    Radiology No results found.  Assessment/Plan 1.  Complication dialysis device:  Patient's Tunneled catheter is not being used. The patient  has an extremity access that is functioning well. Therefore, the patient will undergo removal of the tunneled catheter under local anesthesia.  The risks and benefits were described to the patient.  All questions were answered.  The patient agrees to proceed with angiography and intervention. Potassium will be drawn to ensure that it is an appropriate level prior to performing intervention. 2.  End-stage renal disease requiring hemodialysis:  Patient will continue dialysis therapy without further interruption  3.  Hypertension:  Patient will continue medical management; nephrology is following no changes in oral medications.      Leotis Pain, MD  12/25/2018 9:56 AM

## 2018-12-25 NOTE — Discharge Instructions (Signed)
Tunneled Catheter Removal, Care After °Refer to this sheet in the next few weeks. These instructions provide you with information about caring for yourself after your procedure. Your health care provider may also give you more specific instructions. Your treatment has been planned according to current medical practices, but problems sometimes occur. Call your health care provider if you have any problems or questions after your procedure. °What can I expect after the procedure? °After the procedure, it is common to have: °· Some mild redness, swelling, and pain around your catheter site. ° ° °Follow these instructions at home: °Incision care  °· Check your removal site  every day for signs of infection. Check for: °¨ More redness, swelling, or pain. °¨ More fluid or blood. °¨ Warmth. °¨ Pus or a bad smell. °· Follow instructions from your health care provider about how to take care of your removal site. Make sure you: °¨ Wash your hands with soap and water before you change your bandages (dressings). If soap and water are not available, use hand sanitizer. °Activity  °· Return to your normal activities as told by your health care provider. Ask your health care provider what activities are safe for you. °· Do not lift anything that is heavier than 10 lb (4.5 kg) for 3 weeks or as long as told by your health care provider. ° °Contact a health care provider if: °· You have more fluid or blood coming from your removal site °· You have more redness, swelling, or pain at your incisions or around the area where your catheter was removed °· Your removal site feel warm to the touch. °· You feel unusually weak. °· You feel nauseous.. °· Get help right away if °· You have swelling in your arm, shoulder, neck, or face. °· You develop chest pain. °· You have difficulty breathing. °· You feel dizzy or light-headed. °· You have pus or a bad smell coming from your removal site °· You have a fever. °· You develop bleeding from your  removal site, and your bleeding does not stop. °This information is not intended to replace advice given to you by your health care provider. Make sure you discuss any questions you have with your health care provider. °Document Released: 05/07/2012 Document Revised: 01/22/2016 Document Reviewed: 02/14/2015 °Elsevier Interactive Patient Education © 2017 [employer] ° °

## 2018-12-26 DIAGNOSIS — Z992 Dependence on renal dialysis: Secondary | ICD-10-CM | POA: Diagnosis not present

## 2018-12-26 DIAGNOSIS — N186 End stage renal disease: Secondary | ICD-10-CM | POA: Diagnosis not present

## 2018-12-29 DIAGNOSIS — Z992 Dependence on renal dialysis: Secondary | ICD-10-CM | POA: Diagnosis not present

## 2018-12-29 DIAGNOSIS — E119 Type 2 diabetes mellitus without complications: Secondary | ICD-10-CM | POA: Diagnosis not present

## 2018-12-29 DIAGNOSIS — N186 End stage renal disease: Secondary | ICD-10-CM | POA: Diagnosis not present

## 2018-12-29 LAB — HEMOGLOBIN A1C: Hemoglobin A1C: 5.7

## 2018-12-31 DIAGNOSIS — N186 End stage renal disease: Secondary | ICD-10-CM | POA: Diagnosis not present

## 2018-12-31 DIAGNOSIS — Z992 Dependence on renal dialysis: Secondary | ICD-10-CM | POA: Diagnosis not present

## 2019-01-01 DIAGNOSIS — R569 Unspecified convulsions: Secondary | ICD-10-CM | POA: Diagnosis not present

## 2019-01-01 DIAGNOSIS — I5032 Chronic diastolic (congestive) heart failure: Secondary | ICD-10-CM | POA: Diagnosis not present

## 2019-01-01 DIAGNOSIS — Z992 Dependence on renal dialysis: Secondary | ICD-10-CM | POA: Diagnosis not present

## 2019-01-01 DIAGNOSIS — N186 End stage renal disease: Secondary | ICD-10-CM | POA: Diagnosis not present

## 2019-01-01 DIAGNOSIS — M1711 Unilateral primary osteoarthritis, right knee: Secondary | ICD-10-CM | POA: Diagnosis not present

## 2019-01-01 DIAGNOSIS — I132 Hypertensive heart and chronic kidney disease with heart failure and with stage 5 chronic kidney disease, or end stage renal disease: Secondary | ICD-10-CM | POA: Diagnosis not present

## 2019-01-01 DIAGNOSIS — E1122 Type 2 diabetes mellitus with diabetic chronic kidney disease: Secondary | ICD-10-CM | POA: Diagnosis not present

## 2019-01-01 DIAGNOSIS — E785 Hyperlipidemia, unspecified: Secondary | ICD-10-CM | POA: Diagnosis not present

## 2019-01-02 DIAGNOSIS — N186 End stage renal disease: Secondary | ICD-10-CM | POA: Diagnosis not present

## 2019-01-02 DIAGNOSIS — Z992 Dependence on renal dialysis: Secondary | ICD-10-CM | POA: Diagnosis not present

## 2019-01-05 DIAGNOSIS — N186 End stage renal disease: Secondary | ICD-10-CM | POA: Diagnosis not present

## 2019-01-05 DIAGNOSIS — Z992 Dependence on renal dialysis: Secondary | ICD-10-CM | POA: Diagnosis not present

## 2019-01-07 DIAGNOSIS — I5032 Chronic diastolic (congestive) heart failure: Secondary | ICD-10-CM | POA: Diagnosis not present

## 2019-01-08 DIAGNOSIS — N186 End stage renal disease: Secondary | ICD-10-CM | POA: Diagnosis not present

## 2019-01-08 DIAGNOSIS — T82868A Thrombosis of vascular prosthetic devices, implants and grafts, initial encounter: Secondary | ICD-10-CM | POA: Diagnosis not present

## 2019-01-08 DIAGNOSIS — I871 Compression of vein: Secondary | ICD-10-CM | POA: Diagnosis not present

## 2019-01-08 DIAGNOSIS — Z992 Dependence on renal dialysis: Secondary | ICD-10-CM | POA: Diagnosis not present

## 2019-01-09 DIAGNOSIS — N186 End stage renal disease: Secondary | ICD-10-CM | POA: Diagnosis not present

## 2019-01-09 DIAGNOSIS — Z992 Dependence on renal dialysis: Secondary | ICD-10-CM | POA: Diagnosis not present

## 2019-01-12 DIAGNOSIS — Z992 Dependence on renal dialysis: Secondary | ICD-10-CM | POA: Diagnosis not present

## 2019-01-12 DIAGNOSIS — N186 End stage renal disease: Secondary | ICD-10-CM | POA: Diagnosis not present

## 2019-01-14 DIAGNOSIS — N186 End stage renal disease: Secondary | ICD-10-CM | POA: Diagnosis not present

## 2019-01-14 DIAGNOSIS — Z992 Dependence on renal dialysis: Secondary | ICD-10-CM | POA: Diagnosis not present

## 2019-01-15 ENCOUNTER — Other Ambulatory Visit: Payer: Self-pay | Admitting: Internal Medicine

## 2019-01-15 NOTE — Telephone Encounter (Signed)
Last filled 12-02-18 #60 Last OV 06-23-18 Next OV 01-27-19 [facility]

## 2019-01-16 DIAGNOSIS — N186 End stage renal disease: Secondary | ICD-10-CM | POA: Diagnosis not present

## 2019-01-16 DIAGNOSIS — Z992 Dependence on renal dialysis: Secondary | ICD-10-CM | POA: Diagnosis not present

## 2019-01-19 DIAGNOSIS — Z992 Dependence on renal dialysis: Secondary | ICD-10-CM | POA: Diagnosis not present

## 2019-01-19 DIAGNOSIS — N186 End stage renal disease: Secondary | ICD-10-CM | POA: Diagnosis not present

## 2019-01-21 DIAGNOSIS — N186 End stage renal disease: Secondary | ICD-10-CM | POA: Diagnosis not present

## 2019-01-21 DIAGNOSIS — Z992 Dependence on renal dialysis: Secondary | ICD-10-CM | POA: Diagnosis not present

## 2019-01-23 DIAGNOSIS — Z992 Dependence on renal dialysis: Secondary | ICD-10-CM | POA: Diagnosis not present

## 2019-01-23 DIAGNOSIS — N186 End stage renal disease: Secondary | ICD-10-CM | POA: Diagnosis not present

## 2019-01-26 DIAGNOSIS — N186 End stage renal disease: Secondary | ICD-10-CM | POA: Diagnosis not present

## 2019-01-26 DIAGNOSIS — Z992 Dependence on renal dialysis: Secondary | ICD-10-CM | POA: Diagnosis not present

## 2019-01-27 ENCOUNTER — Other Ambulatory Visit: Payer: Self-pay

## 2019-01-27 ENCOUNTER — Ambulatory Visit (INDEPENDENT_AMBULATORY_CARE_PROVIDER_SITE_OTHER): Payer: Medicare HMO | Admitting: Internal Medicine

## 2019-01-27 ENCOUNTER — Encounter: Payer: Self-pay | Admitting: Internal Medicine

## 2019-01-27 VITALS — BP 110/74 | HR 77 | Temp 97.9°F | Ht 63.0 in | Wt 241.0 lb

## 2019-01-27 DIAGNOSIS — N186 End stage renal disease: Secondary | ICD-10-CM | POA: Diagnosis not present

## 2019-01-27 DIAGNOSIS — E1121 Type 2 diabetes mellitus with diabetic nephropathy: Secondary | ICD-10-CM

## 2019-01-27 DIAGNOSIS — Z7189 Other specified counseling: Secondary | ICD-10-CM | POA: Diagnosis not present

## 2019-01-27 DIAGNOSIS — Z6841 Body Mass Index (BMI) 40.0 and over, adult: Secondary | ICD-10-CM

## 2019-01-27 DIAGNOSIS — Z0000 Encounter for general adult medical examination without abnormal findings: Secondary | ICD-10-CM | POA: Diagnosis not present

## 2019-01-27 DIAGNOSIS — F39 Unspecified mood [affective] disorder: Secondary | ICD-10-CM | POA: Diagnosis not present

## 2019-01-27 DIAGNOSIS — E1122 Type 2 diabetes mellitus with diabetic chronic kidney disease: Secondary | ICD-10-CM | POA: Diagnosis not present

## 2019-01-27 DIAGNOSIS — I5032 Chronic diastolic (congestive) heart failure: Secondary | ICD-10-CM

## 2019-01-27 DIAGNOSIS — Z992 Dependence on renal dialysis: Secondary | ICD-10-CM | POA: Diagnosis not present

## 2019-01-27 DIAGNOSIS — Z1211 Encounter for screening for malignant neoplasm of colon: Secondary | ICD-10-CM

## 2019-01-27 DIAGNOSIS — Z86018 Personal history of other benign neoplasm: Secondary | ICD-10-CM

## 2019-01-27 DIAGNOSIS — R569 Unspecified convulsions: Secondary | ICD-10-CM | POA: Diagnosis not present

## 2019-01-27 LAB — HM DIABETES FOOT EXAM

## 2019-01-27 NOTE — Assessment & Plan Note (Signed)
Chronic dysthymia and grieving Does okay with occasional lorazepam

## 2019-01-27 NOTE — Assessment & Plan Note (Signed)
Lab Results  Component Value Date   HGBA1C 5.7% 12/29/2018   Fine without Rx

## 2019-01-27 NOTE — Assessment & Plan Note (Signed)
Working on regimen ---needs to lose weight to be transplant candidate

## 2019-01-27 NOTE — Progress Notes (Signed)
Subjective:    Patient ID: Carrie [name], female    DOB: Dec 07, 1950, 68 y.o.   MRN: [id number]  HPI Here for Medicare wellness visit and follow up of chronic health conditions Reviewed form and advanced directives Reviewed other doctors No alcohol or tobacco Walking regularly--and chair exercises Vision is okay--needs appt Some occasional problems with hearing--not bad No falls Chronic mild mood issues Still does housework, etc. Some trouble with steps No sig memory problems  Now on dialysis Had graft put in--then revision with stent This has been very tough Dialysis is 5:30AM three days a week Goes home and does her housework Still lives in family All being careful with COVID--others do shopping, etc Hoping to get on transplant list at [facility] Now on phosphate binders---adjusting diet for this  Did have some depression spells---once was bad in dialysis chair Still grieving for husband and daughter No persistent problems Not anhedonic Will take the lorazepam prn--this really helps (one or two a week)  Sugars are fine A1c just 5.7%--done at dialysis No foot pain, numbness or tingling  Still on keppra for seizure prophylaxis  No recent cramping No chest pain No SOB  Some left knee pain--mostly going down steps Uses tylenol prn No other sig joint pains. No swelling  Current Outpatient Medications on File Prior to Visit  Medication Sig Dispense Refill  . atorvastatin (LIPITOR) 40 MG tablet TAKE 40 MG BY MOUTH ONCE DAILY (Patient taking differently: Take 40 mg by mouth daily. ) 90 tablet 3  . levETIRAcetam (KEPPRA) 1000 MG tablet TAKE ONE TABLET BY MOUTH TWICE DAILY (Patient taking differently: Take 1,000 mg by mouth 2 (two) times daily. ) 180 tablet 3  . LORazepam (ATIVAN) 0.5 MG tablet TAKE 1 TABLET (0.5 MG TOTAL) BY MOUTH 2 (TWO) TIMES DAILY AS NEEDED. 60 tablet 0  . losartan (COZAAR) 50 MG tablet Take 50 mg by mouth daily.    . metoprolol succinate (TOPROL-XL) 25  MG 24 hr tablet Take 1 tablet (25 mg total) by mouth daily. 30 tablet 0  . multivitamin (RENA-VIT) TABS tablet Take 1 tablet by mouth at bedtime. 30 tablet 0  . sevelamer carbonate (RENVELA) 800 MG tablet     . torsemide (DEMADEX) 10 MG tablet Take 10 mg by mouth daily.     No current facility-administered medications on file prior to visit.     Allergies  Allergen Reactions  . Naproxen Sodium Anaphylaxis and Swelling  . Sulfamethoxazole-Trimethoprim Anaphylaxis and Swelling    Past Medical History:  Diagnosis Date  . Allergy   . Anemia   . Arthritis    "right knee" (10/26'/2017)  . CKD (chronic kidney disease), stage III ([place])    12/2015 (Dr. Lavonia Dana)  . Depression   . History of blood transfusion 2008   "when I had brain tumor surgery"  . History of MRSA infection 2008  . Hypertension   . 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 Type II diabetes mellitus ([place]) 2011   on metformin until yr ago- no med now - off due to kidneys    Past Surgical History:  Procedure Laterality Date  . AV FISTULA PLACEMENT Left 11/07/2018   Procedure: ARTERIOVENOUS (AV) FISTULA CREATION ( BRACHIO BASILIC ) VS BRACHIAL AXILLARY GRAFT;  Surgeon: Katha Cabal, MD;  Location: [facility];  Service: Vascular;  Laterality: Left;  . BRAIN MENINGIOMA EXCISION  08/2006   Foramina magnum meningioma  . CERVICAL DISCECTOMY  1996   Dr. Alric Seton  .  CESAREAN SECTION  1981  . COLONOSCOPY    . DIALYSIS/PERMA CATHETER INSERTION N/A 08/04/2018   Procedure: DIALYSIS/PERMA CATHETER INSERTION;  Surgeon: Algernon Huxley, MD;  Location: [place] [facility];  Service: Cardiovascular;  Laterality: N/A;  . DIALYSIS/PERMA CATHETER REMOVAL N/A 12/25/2018   Procedure: DIALYSIS/PERMA CATHETER REMOVAL;  Surgeon: Algernon Huxley, MD;  Location: [place] [facility];  Service: Cardiovascular;  Laterality: N/A;  . DILATION AND CURETTAGE OF UTERUS    . JOINT REPLACEMENT     right knee left hip  . Right wrist x-ray  2007   Deg.  at 1st MCP  . TOTAL HIP ARTHROPLASTY Left 03/27/2016   Procedure: LEFT TOTAL HIP ARTHROPLASTY ANTERIOR APPROACH;  Surgeon: Mcarthur Rossetti, MD;  Location: [place];  Service: Orthopedics;  Laterality: Left;  . TOTAL KNEE ARTHROPLASTY Right 04/30/2017   Procedure: RIGHT TOTAL KNEE ARTHROPLASTY;  Surgeon: Mcarthur Rossetti, MD;  Location: [place];  Service: Orthopedics;  Laterality: Right;  . TUBAL LIGATION  1981    Family History  Problem Relation Age of Onset  . Hypertension Other        Strong family history   . Breast cancer Mother 86  . Heart failure Sister   . Heart disease Sister     Social History   Socioeconomic History  . Marital status: Widowed    Spouse name: Not on file  . Number of children: 1  . Years of education: Not on file  . Highest education level: Not on file  Occupational History  . 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 Occupation: Disabled since brain surgery  Social Needs  . Financial resource strain: Not on file  . Food insecurity    Worry: Not on file    Inability: Not on file  . Transportation needs    Medical: Not on file    Non-medical: Not on file  Tobacco Use  . Smoking status: Never Smoker  . Smokeless tobacco: Never Used  Substance and Sexual Activity  . Alcohol use: No    Alcohol/week: 0.0 standard drinks  . Drug use: No  . Sexual activity: Never  Lifestyle  . Physical activity    Days per week: Not on file    Minutes per session: Not on file  . Stress: Not on file  Relationships  . Social Herbalist on phone: Not on file    Gets together: Not on file    Attends religious service: Not on file    Active member of club or organization: Not on file    Attends meetings of clubs or organizations: Not on file    Relationship status: Not on file  . Intimate partner violence    Fear of current or ex partner: Not on file    Emotionally abused: Not on file    Physically abused: Not on file    Forced  sexual activity: Not on file  Other Topics Concern  . Not on file  Social History Narrative   Lives in family home with extended family.      No living will   Requests niece, [name], as health care POA   Would accept resuscitation attempts   No tube feeds if cognitively aware    Review of Systems Appetite is fine Weight down slightly Sleeps better now Few bottom teeth now---does fine without dentures. Top okay Wears seat belt Bowels are fine--no blood. Gets iron through dialysis weekly No heartburn. Occasionally coughs  from saliva stuck in throat. No problems eating or drinking No rash or suspicious lesions Voids okay. No incontinence    Objective:   Physical Exam  Constitutional: She is oriented to person, place, and time. She appears well-developed. No distress.  HENT:  Mouth/Throat: Oropharynx is clear and moist. No oropharyngeal exudate.  Neck: No thyromegaly present.  Cardiovascular: Normal rate, regular rhythm, normal heart sounds and intact distal pulses. Exam reveals no gallop.  No murmur heard. Respiratory: Effort normal and breath sounds normal. No respiratory distress. She has no wheezes. She has no rales.  GI: Soft. There is no abdominal tenderness.  Musculoskeletal:        General: No tenderness or edema.  Lymphadenopathy:    She has no cervical adenopathy.  Neurological: She is alert and oriented to person, place, and time.  President--- "Daisy Floro, [place]" [phone] [id number] Recall 3/3  Fairly normal sensation in feet  Skin:  No foot lesions  Psychiatric: She has a normal mood and affect. Her behavior is normal.           Assessment & Plan:

## 2019-01-27 NOTE — Progress Notes (Signed)
Hearing Screening   Method: Audiometry   125Hz  250Hz  500Hz  1000Hz  2000Hz  3000Hz  4000Hz  6000Hz  8000Hz   Right ear:   40 40 40  40    Left ear:   20 20 20  20       Visual Acuity Screening   Right eye Left eye Both eyes  Without correction:     With correction: 20/25 20/20 20/20

## 2019-01-27 NOTE — Assessment & Plan Note (Signed)
Compensated with dialysis

## 2019-01-27 NOTE — Assessment & Plan Note (Signed)
See social history 

## 2019-01-27 NOTE — Assessment & Plan Note (Signed)
I have personally reviewed the Medicare Annual Wellness questionnaire and have noted 1. The patient's medical and social history 2. Their use of alcohol, tobacco or illicit drugs 3. Their current medications and supplements 4. The patient's functional ability including ADL's, fall risks, home safety risks and hearing or visual             impairment. 5. Diet and physical activities 6. Evidence for depression or mood disorders  The patients weight, height, BMI and visual acuity have been recorded in the chart I have made referrals, counseling and provided education to the patient based review of the above and I have provided the pt with a written personalized care plan for preventive services.  I have provided you with a copy of your personalized plan for preventive services. Please take the time to review along with your updated medication list.  Will do FIT--she promises to do it this year Mammogram due next year Will get flu vaccine at dialysis Trying to exercise No pap due to age

## 2019-01-27 NOTE — Assessment & Plan Note (Signed)
Having trouble with dialysis--hard to manage Hopefully can get on transplant list--sister may donate

## 2019-01-27 NOTE — Assessment & Plan Note (Signed)
No seizures on chronic keppra

## 2019-01-28 DIAGNOSIS — Z992 Dependence on renal dialysis: Secondary | ICD-10-CM | POA: Diagnosis not present

## 2019-01-28 DIAGNOSIS — N186 End stage renal disease: Secondary | ICD-10-CM | POA: Diagnosis not present

## 2019-01-30 DIAGNOSIS — Z992 Dependence on renal dialysis: Secondary | ICD-10-CM | POA: Diagnosis not present

## 2019-01-30 DIAGNOSIS — N186 End stage renal disease: Secondary | ICD-10-CM | POA: Diagnosis not present

## 2019-02-02 DIAGNOSIS — Z992 Dependence on renal dialysis: Secondary | ICD-10-CM | POA: Diagnosis not present

## 2019-02-02 DIAGNOSIS — N186 End stage renal disease: Secondary | ICD-10-CM | POA: Diagnosis not present

## 2019-02-04 DIAGNOSIS — N186 End stage renal disease: Secondary | ICD-10-CM | POA: Diagnosis not present

## 2019-02-04 DIAGNOSIS — Z992 Dependence on renal dialysis: Secondary | ICD-10-CM | POA: Diagnosis not present

## 2019-02-04 DIAGNOSIS — Z23 Encounter for immunization: Secondary | ICD-10-CM | POA: Diagnosis not present

## 2019-02-06 DIAGNOSIS — N186 End stage renal disease: Secondary | ICD-10-CM | POA: Diagnosis not present

## 2019-02-06 DIAGNOSIS — Z23 Encounter for immunization: Secondary | ICD-10-CM | POA: Diagnosis not present

## 2019-02-06 DIAGNOSIS — Z992 Dependence on renal dialysis: Secondary | ICD-10-CM | POA: Diagnosis not present

## 2019-02-07 DIAGNOSIS — I5032 Chronic diastolic (congestive) heart failure: Secondary | ICD-10-CM | POA: Diagnosis not present

## 2019-02-09 DIAGNOSIS — N186 End stage renal disease: Secondary | ICD-10-CM | POA: Diagnosis not present

## 2019-02-09 DIAGNOSIS — Z23 Encounter for immunization: Secondary | ICD-10-CM | POA: Diagnosis not present

## 2019-02-09 DIAGNOSIS — Z992 Dependence on renal dialysis: Secondary | ICD-10-CM | POA: Diagnosis not present

## 2019-02-11 DIAGNOSIS — N186 End stage renal disease: Secondary | ICD-10-CM | POA: Diagnosis not present

## 2019-02-11 DIAGNOSIS — Z23 Encounter for immunization: Secondary | ICD-10-CM | POA: Diagnosis not present

## 2019-02-11 DIAGNOSIS — Z992 Dependence on renal dialysis: Secondary | ICD-10-CM | POA: Diagnosis not present

## 2019-02-13 DIAGNOSIS — Z23 Encounter for immunization: Secondary | ICD-10-CM | POA: Diagnosis not present

## 2019-02-13 DIAGNOSIS — N186 End stage renal disease: Secondary | ICD-10-CM | POA: Diagnosis not present

## 2019-02-13 DIAGNOSIS — Z992 Dependence on renal dialysis: Secondary | ICD-10-CM | POA: Diagnosis not present

## 2019-02-16 DIAGNOSIS — Z992 Dependence on renal dialysis: Secondary | ICD-10-CM | POA: Diagnosis not present

## 2019-02-16 DIAGNOSIS — N186 End stage renal disease: Secondary | ICD-10-CM | POA: Diagnosis not present

## 2019-02-16 DIAGNOSIS — Z23 Encounter for immunization: Secondary | ICD-10-CM | POA: Diagnosis not present

## 2019-02-18 DIAGNOSIS — Z23 Encounter for immunization: Secondary | ICD-10-CM | POA: Diagnosis not present

## 2019-02-18 DIAGNOSIS — Z992 Dependence on renal dialysis: Secondary | ICD-10-CM | POA: Diagnosis not present

## 2019-02-18 DIAGNOSIS — N186 End stage renal disease: Secondary | ICD-10-CM | POA: Diagnosis not present

## 2019-02-20 DIAGNOSIS — Z23 Encounter for immunization: Secondary | ICD-10-CM | POA: Diagnosis not present

## 2019-02-20 DIAGNOSIS — N186 End stage renal disease: Secondary | ICD-10-CM | POA: Diagnosis not present

## 2019-02-20 DIAGNOSIS — Z992 Dependence on renal dialysis: Secondary | ICD-10-CM | POA: Diagnosis not present

## 2019-02-23 DIAGNOSIS — Z992 Dependence on renal dialysis: Secondary | ICD-10-CM | POA: Diagnosis not present

## 2019-02-23 DIAGNOSIS — Z23 Encounter for immunization: Secondary | ICD-10-CM | POA: Diagnosis not present

## 2019-02-23 DIAGNOSIS — N186 End stage renal disease: Secondary | ICD-10-CM | POA: Diagnosis not present

## 2019-02-25 DIAGNOSIS — N186 End stage renal disease: Secondary | ICD-10-CM | POA: Diagnosis not present

## 2019-02-25 DIAGNOSIS — Z992 Dependence on renal dialysis: Secondary | ICD-10-CM | POA: Diagnosis not present

## 2019-02-25 DIAGNOSIS — Z23 Encounter for immunization: Secondary | ICD-10-CM | POA: Diagnosis not present

## 2019-02-26 ENCOUNTER — Other Ambulatory Visit (INDEPENDENT_AMBULATORY_CARE_PROVIDER_SITE_OTHER): Payer: Self-pay | Admitting: Nephrology

## 2019-02-26 ENCOUNTER — Ambulatory Visit (INDEPENDENT_AMBULATORY_CARE_PROVIDER_SITE_OTHER): Payer: Medicare HMO

## 2019-02-26 ENCOUNTER — Telehealth (INDEPENDENT_AMBULATORY_CARE_PROVIDER_SITE_OTHER): Payer: Self-pay

## 2019-02-26 ENCOUNTER — Other Ambulatory Visit: Payer: Self-pay

## 2019-02-26 DIAGNOSIS — T829XXS Unspecified complication of cardiac and vascular prosthetic device, implant and graft, sequela: Secondary | ICD-10-CM

## 2019-02-26 DIAGNOSIS — N186 End stage renal disease: Secondary | ICD-10-CM | POA: Diagnosis not present

## 2019-02-26 DIAGNOSIS — Z992 Dependence on renal dialysis: Secondary | ICD-10-CM

## 2019-02-26 NOTE — Telephone Encounter (Signed)
Spoke with the patient and she is now scheduled with Dr. Delana Meyer for a left arm fistulagram on 03/10/2019 with a 6:45 am arrival time to the [facility]. Patient will do her Covid testing on 03/06/2019 between 12:30-2:30 pm at the [facility]. Pre-procedure instructions were discussed and will be mailed to the patient.

## 2019-02-27 DIAGNOSIS — N186 End stage renal disease: Secondary | ICD-10-CM | POA: Diagnosis not present

## 2019-02-27 DIAGNOSIS — Z992 Dependence on renal dialysis: Secondary | ICD-10-CM | POA: Diagnosis not present

## 2019-02-27 DIAGNOSIS — Z23 Encounter for immunization: Secondary | ICD-10-CM | POA: Diagnosis not present

## 2019-03-02 DIAGNOSIS — Z992 Dependence on renal dialysis: Secondary | ICD-10-CM | POA: Diagnosis not present

## 2019-03-02 DIAGNOSIS — N186 End stage renal disease: Secondary | ICD-10-CM | POA: Diagnosis not present

## 2019-03-02 DIAGNOSIS — Z23 Encounter for immunization: Secondary | ICD-10-CM | POA: Diagnosis not present

## 2019-03-04 DIAGNOSIS — Z23 Encounter for immunization: Secondary | ICD-10-CM | POA: Diagnosis not present

## 2019-03-04 DIAGNOSIS — N186 End stage renal disease: Secondary | ICD-10-CM | POA: Diagnosis not present

## 2019-03-04 DIAGNOSIS — Z992 Dependence on renal dialysis: Secondary | ICD-10-CM | POA: Diagnosis not present

## 2019-03-05 ENCOUNTER — Other Ambulatory Visit: Payer: Self-pay

## 2019-03-05 ENCOUNTER — Other Ambulatory Visit
Admission: RE | Admit: 2019-03-05 | Discharge: 2019-03-05 | Disposition: A | Discharge status: Home / Self Care | Payer: Medicare HMO | Source: Ambulatory Visit | Attending: Vascular Surgery | Admitting: Vascular Surgery

## 2019-03-05 DIAGNOSIS — Z01812 Encounter for preprocedural laboratory examination: Secondary | ICD-10-CM | POA: Diagnosis not present

## 2019-03-05 DIAGNOSIS — Z20828 Contact with and (suspected) exposure to other viral communicable diseases: Secondary | ICD-10-CM | POA: Diagnosis not present

## 2019-03-06 ENCOUNTER — Other Ambulatory Visit
Admission: RE | Admit: 2019-03-06 | Discharge: 2019-03-06 | Disposition: A | Discharge status: Home / Self Care | Payer: Medicare HMO | Source: Ambulatory Visit | Attending: Vascular Surgery | Admitting: Vascular Surgery

## 2019-03-06 DIAGNOSIS — Z992 Dependence on renal dialysis: Secondary | ICD-10-CM | POA: Diagnosis not present

## 2019-03-06 DIAGNOSIS — N186 End stage renal disease: Secondary | ICD-10-CM | POA: Diagnosis not present

## 2019-03-06 LAB — SARS CORONAVIRUS 2 (TAT 6-24 HRS): SARS Coronavirus 2: NEGATIVE

## 2019-03-07 ENCOUNTER — Other Ambulatory Visit: Payer: Self-pay | Admitting: Internal Medicine

## 2019-03-09 ENCOUNTER — Other Ambulatory Visit (INDEPENDENT_AMBULATORY_CARE_PROVIDER_SITE_OTHER): Payer: Self-pay | Admitting: Nurse Practitioner

## 2019-03-09 DIAGNOSIS — I5032 Chronic diastolic (congestive) heart failure: Secondary | ICD-10-CM | POA: Diagnosis not present

## 2019-03-09 DIAGNOSIS — N186 End stage renal disease: Secondary | ICD-10-CM | POA: Diagnosis not present

## 2019-03-09 DIAGNOSIS — Z992 Dependence on renal dialysis: Secondary | ICD-10-CM | POA: Diagnosis not present

## 2019-03-09 NOTE — Telephone Encounter (Signed)
Lorazepam last filled 01-15-19 #60 Keppra last filled 02-07-19 #180 Last OV 01-27-19 Next OV 02-02-20 [facility]

## 2019-03-10 ENCOUNTER — Ambulatory Visit
Admission: RE | Admit: 2019-03-10 | Discharge: 2019-03-10 | Disposition: A | Discharge status: Home / Self Care | Payer: Medicare HMO | Attending: Vascular Surgery | Admitting: Vascular Surgery

## 2019-03-10 ENCOUNTER — Other Ambulatory Visit: Payer: Self-pay

## 2019-03-10 ENCOUNTER — Encounter: Payer: Self-pay | Admitting: *Deleted

## 2019-03-10 ENCOUNTER — Encounter
Admission: RE | Disposition: A | Discharge status: Home / Self Care | Payer: Self-pay | Source: Home / Self Care | Attending: Vascular Surgery

## 2019-03-10 DIAGNOSIS — T82898A Other specified complication of vascular prosthetic devices, implants and grafts, initial encounter: Secondary | ICD-10-CM | POA: Diagnosis not present

## 2019-03-10 DIAGNOSIS — E1122 Type 2 diabetes mellitus with diabetic chronic kidney disease: Secondary | ICD-10-CM | POA: Insufficient documentation

## 2019-03-10 DIAGNOSIS — N186 End stage renal disease: Secondary | ICD-10-CM | POA: Diagnosis not present

## 2019-03-10 DIAGNOSIS — Y841 Kidney dialysis as the cause of abnormal reaction of the patient, or of later complication, without mention of misadventure at the time of the procedure: Secondary | ICD-10-CM | POA: Diagnosis not present

## 2019-03-10 DIAGNOSIS — Z992 Dependence on renal dialysis: Secondary | ICD-10-CM | POA: Insufficient documentation

## 2019-03-10 DIAGNOSIS — T82858A Stenosis of vascular prosthetic devices, implants and grafts, initial encounter: Secondary | ICD-10-CM | POA: Diagnosis not present

## 2019-03-10 DIAGNOSIS — I120 Hypertensive chronic kidney disease with stage 5 chronic kidney disease or end stage renal disease: Secondary | ICD-10-CM | POA: Diagnosis not present

## 2019-03-10 HISTORY — PX: A/V FISTULAGRAM: CATH118298

## 2019-03-10 LAB — GLUCOSE, CAPILLARY
Glucose-Capillary: 109 mg/dL — ABNORMAL HIGH (ref 70–99)
Glucose-Capillary: 105 mg/dL — ABNORMAL HIGH (ref 70–99)

## 2019-03-10 LAB — POTASSIUM (ARMC VASCULAR LAB ONLY): Potassium (ARMC vascular lab): 4.5 (ref 3.5–5.1)

## 2019-03-10 SURGERY — A/V FISTULAGRAM
Anesthesia: Moderate Sedation | Laterality: Left

## 2019-03-10 MED ORDER — MIDAZOLAM HCL 2 MG/ML PO SYRP: 8.0000 mg | ORAL_SOLUTION | Freq: Once | ORAL | Status: DC | PRN

## 2019-03-10 MED ORDER — CEFAZOLIN SODIUM-DEXTROSE 1-4 GM/50ML-% IV SOLN
INTRAVENOUS | Status: AC
  Administered 2019-03-10: 08:00:00 1 g via INTRAVENOUS
  Filled 2019-03-10: qty 50

## 2019-03-10 MED ORDER — METHYLPREDNISOLONE SODIUM SUCC 125 MG IJ SOLR: 125.0000 mg | Freq: Once | INTRAMUSCULAR | Status: DC | PRN

## 2019-03-10 MED ORDER — FENTANYL CITRATE (PF) 100 MCG/2ML IJ SOLN
INTRAMUSCULAR | Status: DC | PRN
  Administered 2019-03-10 (×2): 50 ug via INTRAVENOUS

## 2019-03-10 MED ORDER — SODIUM CHLORIDE 0.9 % IV SOLN: INTRAVENOUS | Status: DC

## 2019-03-10 MED ORDER — FENTANYL CITRATE (PF) 100 MCG/2ML IJ SOLN
INTRAMUSCULAR | Status: AC
  Filled 2019-03-10: qty 2

## 2019-03-10 MED ORDER — HEPARIN SODIUM (PORCINE) 1000 UNIT/ML IJ SOLN
INTRAMUSCULAR | Status: AC
  Filled 2019-03-10: qty 1

## 2019-03-10 MED ORDER — HYDROMORPHONE HCL 1 MG/ML IJ SOLN: 1.0000 mg | Freq: Once | INTRAMUSCULAR | Status: DC | PRN

## 2019-03-10 MED ORDER — MIDAZOLAM HCL 2 MG/2ML IJ SOLN
INTRAMUSCULAR | Status: DC | PRN
  Administered 2019-03-10 (×2): 2 mg via INTRAVENOUS

## 2019-03-10 MED ORDER — DIPHENHYDRAMINE HCL 50 MG/ML IJ SOLN: 50.0000 mg | Freq: Once | INTRAMUSCULAR | Status: DC | PRN

## 2019-03-10 MED ORDER — CEFAZOLIN SODIUM-DEXTROSE 1-4 GM/50ML-% IV SOLN
1.0000 g | Freq: Once | INTRAVENOUS | Status: AC
  Administered 2019-03-10: 08:00:00 1 g via INTRAVENOUS

## 2019-03-10 MED ORDER — ONDANSETRON HCL 4 MG/2ML IJ SOLN: 4.0000 mg | Freq: Four times a day (QID) | INTRAMUSCULAR | Status: DC | PRN

## 2019-03-10 MED ORDER — HEPARIN SODIUM (PORCINE) 1000 UNIT/ML IJ SOLN
INTRAMUSCULAR | Status: DC | PRN
  Administered 2019-03-10: 3000 [IU] via INTRAVENOUS

## 2019-03-10 MED ORDER — IODIXANOL 320 MG/ML IV SOLN
INTRAVENOUS | Status: DC | PRN
  Administered 2019-03-10: 40 mL via INTRAVENOUS

## 2019-03-10 MED ORDER — FAMOTIDINE 20 MG PO TABS: 40.0000 mg | ORAL_TABLET | Freq: Once | ORAL | Status: DC | PRN

## 2019-03-10 MED ORDER — MIDAZOLAM HCL 5 MG/5ML IJ SOLN
INTRAMUSCULAR | Status: AC
  Filled 2019-03-10: qty 5

## 2019-03-10 SURGICAL SUPPLY — 14 items
BALLN DORADO 8X60X80 (BALLOONS) ×3
BALLOON DORADO 8X60X80 (BALLOONS) IMPLANT
DEVICE PRESTO INFLATION (MISCELLANEOUS) ×2 IMPLANT
DRAPE BRACHIAL (DRAPES) ×2 IMPLANT
NDL ENTRY 21GA 7CM ECHOTIP (NEEDLE) IMPLANT
NEEDLE ENTRY 21GA 7CM ECHOTIP (NEEDLE) ×3 IMPLANT
PACK ANGIOGRAPHY (CUSTOM PROCEDURE TRAY) ×3 IMPLANT
SET INTRO CAPELLA COAXIAL (SET/KITS/TRAYS/PACK) ×2 IMPLANT
SHEATH BRITE TIP 6FRX5.5 (SHEATH) ×2 IMPLANT
SHEATH BRITE TIP 7FRX5.5 (SHEATH) ×2 IMPLANT
STENT COVERA STRAIGHT8X60X80 (Permanent Stent) ×2 IMPLANT
SUT MNCRL AB 4-0 PS2 18 (SUTURE) ×2 IMPLANT
TOWEL OR 17X26 4PK STRL BLUE (TOWEL DISPOSABLE) ×2 IMPLANT
WIRE MAGIC TOR.035 180C (WIRE) ×2 IMPLANT

## 2019-03-10 NOTE — Op Note (Signed)
OPERATIVE NOTE   PROCEDURE: 1. Contrast injection left brachial axillary AV access 2. Percutaneous transluminal angioplasty and stent placement midportion left brachial axillary graft  PRE-OPERATIVE DIAGNOSIS: Complication of dialysis access                                                       End Stage Renal Disease  POST-OPERATIVE DIAGNOSIS: same as above   SURGEON: Katha Cabal, M.D.  ANESTHESIA: Conscious sedation was administered under my direct supervision by the interventional radiology RN. IV Versed plus fentanyl were utilized. Continuous ECG, pulse oximetry and blood pressure was monitored throughout the entire procedure.  Conscious sedation was for a total of 35 minutes.  ESTIMATED BLOOD LOSS: minimal  FINDING(S): 1. 90% stenosis in the mid to proximal AV graft  SPECIMEN(S):  None  CONTRAST: 40 cc  FLUOROSCOPY TIME: 1.9 minutes  INDICATIONS: Carrie [name] is a 68 y.o. female who  presents with malfunctioning left arm AV access.  The patient is scheduled for angiography with possible intervention of the AV access.  The patient is aware the risks include but are not limited to: bleeding, infection, thrombosis of the cannulated access, and possible anaphylactic reaction to the contrast.  The patient acknowledges if the access can not be salvaged a tunneled catheter will be needed and will be placed during this procedure.  The patient is aware of the risks of the procedure and elects to proceed with the angiogram and intervention.  DESCRIPTION: After full informed written consent was obtained, the patient was brought back to the Special Procedure suite and placed supine position.  Appropriate cardiopulmonary monitors were placed.  The left arm was prepped and draped in the standard fashion.  Appropriate timeout is called. The left brachial axillary was cannulated with a micropuncture needle.  Cannulation was performed with ultrasound guidance. Ultrasound was placed in a  sterile sleeve, the AV access was interrogated and noted to be echolucent and compressible indicating patency. Image was recorded for the permanent record. The puncture is performed under continuous ultrasound visualization.   The microwire was advanced and the needle was exchanged for  a microsheath.  The J-wire was then advanced and a 6 Fr sheath inserted.  Hand injections were completed to image the access from the arterial anastomosis through the entire access.  The central venous structures were also imaged by hand injections.  90% stenosis was noted in the graft in the area of venous cannulation.  Central veins were were patent and on admit vein shows approximate 40 to 50% stenosis in the proximal innominate but there are no collaterals and so I do not believe this lesion is hemodynamically significant at this time.  Based on the images,  3000 units of heparin was given and a wire was negotiated through the strictures within the venous portion of the graft.  An 8 mm x 60 mm Covera was deployed across the stenoses and postdilated with an 8 mm [place] balloon.  Follow-up imaging demonstrates complete resolution of the stricture with rapid flow of contrast through the graft, the central venous anatomy is preserved.  A 4-0 Monocryl purse-string suture was sewn around the sheath.  The sheath was removed and light pressure was applied.  A sterile bandage was applied to the puncture site.    COMPLICATIONS: None  CONDITION: Good  Katha Cabal, M.D [facility] Office: [phone]  03/10/2019 9:06 AM

## 2019-03-10 NOTE — H&P (Signed)
[facility] Admission History & Physical  MRN : [id number]  Carrie [name] is a 68 y.o. (1951-01-10) female who presents with chief complaint of No chief complaint on file. [name]  History of Present Illness: I am asked to evaluate the patient by the dialysis center. The patient was sent here because they were unable to achieve adequate dialysis this morning. Furthermore the Center states there is very poor thrill and bruit. The patient states there there have been increasing problems with the access, such as "pulling clots" during dialysis and prolonged bleeding after decannulation. The patient estimates these problems have been going on for several weeks. The patient is unaware of any other change.  Patient denies pain or tenderness overlying the access.  There is no pain with dialysis.  The patient denies hand pain or finger pain consistent with steal syndrome.   There have not been  any past interventions or declots of this access.  The patient is not chronically hypotensive on dialysis.  Current Facility-Administered Medications  Medication Dose Route Frequency Provider Last Rate Last Dose  . 0.9 %  sodium chloride infusion   Intravenous Continuous Eulogio Ditch E, NP      . ceFAZolin (ANCEF) 1-4 GM/50ML-% IVPB           . ceFAZolin (ANCEF) IVPB 1 g/50 mL premix  1 g Intravenous Once Eulogio Ditch E, NP      . diphenhydrAMINE (BENADRYL) injection 50 mg  50 mg Intravenous Once PRN Kris Hartmann, NP      . famotidine (PEPCID) tablet 40 mg  40 mg Oral Once PRN Kris Hartmann, NP      . fentaNYL (SUBLIMAZE) 100 MCG/2ML injection           . heparin 1000 UNIT/ML injection           . HYDROmorphone (DILAUDID) injection 1 mg  1 mg Intravenous Once PRN Eulogio Ditch E, NP      . methylPREDNISolone sodium succinate (SOLU-MEDROL) 125 mg/2 mL injection 125 mg  125 mg Intravenous Once PRN Eulogio Ditch E, NP      . midazolam (VERSED) 2 MG/ML syrup 8 mg  8 mg Oral Once PRN  Kris Hartmann, NP      . midazolam (VERSED) 5 MG/5ML injection           . ondansetron (ZOFRAN) injection 4 mg  4 mg Intravenous Q6H PRN Kris Hartmann, NP        Past Medical History:  Diagnosis Date  . Allergy   . Anemia   . Arthritis    "right knee" (10/26'/2017)  . CKD (chronic kidney disease), stage III    12/2015 (Dr. Lavonia Dana)  . Depression   . History of blood transfusion 2008   "when I had brain tumor surgery"  . History of MRSA infection 2008  . Hypertension   . 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 Type II diabetes mellitus ([place]) 2011   on metformin until yr ago- no med now - off due to kidneys    Past Surgical History:  Procedure Laterality Date  . AV FISTULA PLACEMENT Left 11/07/2018   Procedure: ARTERIOVENOUS (AV) FISTULA CREATION ( BRACHIO BASILIC ) VS BRACHIAL AXILLARY GRAFT;  Surgeon: Katha Cabal, MD;  Location: [facility];  Service: Vascular;  Laterality: Left;  . BRAIN MENINGIOMA EXCISION  08/2006   Foramina magnum meningioma  . CERVICAL DISCECTOMY  1996   Dr. Alric Seton  .  CESAREAN SECTION  1981  . COLONOSCOPY    . DIALYSIS/PERMA CATHETER INSERTION N/A 08/04/2018   Procedure: DIALYSIS/PERMA CATHETER INSERTION;  Surgeon: Algernon Huxley, MD;  Location: [facility];  Service: Cardiovascular;  Laterality: N/A;  . DIALYSIS/PERMA CATHETER REMOVAL N/A 12/25/2018   Procedure: DIALYSIS/PERMA CATHETER REMOVAL;  Surgeon: Algernon Huxley, MD;  Location: [facility];  Service: Cardiovascular;  Laterality: N/A;  . DILATION AND CURETTAGE OF UTERUS    . JOINT REPLACEMENT     right knee left hip  . Right wrist x-ray  2007   Deg. at 1st MCP  . TOTAL HIP ARTHROPLASTY Left 03/27/2016   Procedure: LEFT TOTAL HIP ARTHROPLASTY ANTERIOR APPROACH;  Surgeon: Mcarthur Rossetti, MD;  Location: [facility];  Service: Orthopedics;  Laterality: Left;  . TOTAL KNEE ARTHROPLASTY Right 04/30/2017   Procedure: RIGHT TOTAL KNEE ARTHROPLASTY;  Surgeon: Mcarthur Rossetti, MD;  Location: [facility];  Service: Orthopedics;  Laterality: Right;  . TUBAL LIGATION  1981    Social History Social History   Tobacco Use  . Smoking status: Never Smoker  . Smokeless tobacco: Never Used  Substance Use Topics  . Alcohol use: No    Alcohol/week: 0.0 standard drinks  . Drug use: No    Family History Family History  Problem Relation Age of Onset  . Hypertension Other        Strong family history   . Breast cancer Mother 54  . Heart failure Sister   . Heart disease Sister     No family history of bleeding or clotting disorders, autoimmune disease or porphyria  Allergies  Allergen Reactions  . Naproxen Sodium Anaphylaxis and Swelling  . Sulfamethoxazole-Trimethoprim Anaphylaxis and Swelling     REVIEW OF SYSTEMS (Negative unless checked)  Constitutional: [] Weight loss  [] Fever  [] Chills Cardiac: [] Chest pain   [] Chest pressure   [] Palpitations   [] Shortness of breath when laying flat   [] Shortness of breath at rest   [x] Shortness of breath with exertion. Vascular:  [] Pain in legs with walking   [] Pain in legs at rest   [] Pain in legs when laying flat   [] Claudication   [] Pain in feet when walking  [] Pain in feet at rest  [] Pain in feet when laying flat   [] History of DVT   [] Phlebitis   [] Swelling in legs   [] Varicose veins   [] Non-healing ulcers Pulmonary:   [] Uses home oxygen   [] Productive cough   [] Hemoptysis   [] Wheeze  [] COPD   [] Asthma Neurologic:  [] Dizziness  [] Blackouts   [] Seizures   [] History of stroke   [] History of TIA  [] Aphasia   [] Temporary blindness   [] Dysphagia   [] Weakness or numbness in arms   [] Weakness or numbness in legs Musculoskeletal:  [] Arthritis   [] Joint swelling   [] Joint pain   [] Low back pain Hematologic:  [] Easy bruising  [] Easy bleeding   [] Hypercoagulable state   [] Anemic  [] Hepatitis Gastrointestinal:  [] Blood in stool   [] Vomiting blood  [] Gastroesophageal reflux/heartburn   [] Difficulty  swallowing. Genitourinary:  [x] Chronic kidney disease   [] Difficult urination  [] Frequent urination  [] Burning with urination   [] Blood in urine Skin:  [] Rashes   [] Ulcers   [] Wounds Psychological:  [] History of anxiety   []  History of major depression.  Physical Examination  Vitals:   03/10/19 0714  BP: 137/75  Pulse: 77  Resp: 16  Temp: 98.1 F (36.7 C)  TempSrc: Oral  SpO2: 97%  Weight: 104.3 kg  Height: 5\' 3"  (1.6 m)   Body mass index is 40.74 kg/m. Gen: WD/WN, NAD Head: [place]/AT, No temporalis wasting. Prominent temp pulse not noted. Ear/Nose/Throat: Hearing grossly intact, nares w/o erythema or drainage, oropharynx w/o Erythema/Exudate,  Eyes: Conjunctiva clear, sclera non-icteric Neck: Trachea midline.  No JVD.  Pulmonary:  Good air movement, respirations not labored, no use of accessory muscles.  Cardiac: RRR, normal S1, S2. Vascular:  Left brachial axillary graft poor thrill and poor bruit Vessel Right Left  Radial Palpable Palpable  Ulnar Not Palpable Not Palpable  Brachial Palpable Palpable  Carotid Palpable, without bruit Palpable, without bruit  Gastrointestinal: soft, non-tender/non-distended. No guarding/reflex.  Musculoskeletal: M/S 5/5 throughout.  Extremities without ischemic changes.  No deformity or atrophy.  Neurologic: Sensation grossly intact in extremities.  Symmetrical.  Speech is fluent. Motor exam as listed above. Psychiatric: Judgment intact, Mood & affect appropriate for pt's clinical situation. Dermatologic: No rashes or ulcers noted.  No cellulitis or open wounds. Lymph : No Cervical, Axillary, or Inguinal lymphadenopathy.   CBC Lab Results  Component Value Date   WBC 5.8 11/04/2018   HGB 13.3 11/07/2018   HCT 39.0 11/07/2018   MCV 93.4 11/04/2018   PLT 278 11/04/2018    BMET    Component Value Date/Time   NA 137 11/07/2018 0641   K 3.7 11/07/2018 0641   CL 99 11/04/2018 1208   CO2 28 11/04/2018 1208   GLUCOSE 109 (H) 11/07/2018  0641   BUN 36 (H) 11/04/2018 1208   BUN 36 (A) 07/08/2017   CREATININE 5.46 (H) 11/04/2018 1208   CALCIUM 9.0 11/04/2018 1208   GFRNONAA 7 (L) 11/04/2018 1208   GFRAA 9 (L) 11/04/2018 1208   CrCl cannot be calculated (Patient's most recent lab result is older than the maximum 21 days allowed.).  COAG Lab Results  Component Value Date   INR 1.0 11/04/2018   INR 1.61 05/03/2017   INR 1.37 05/02/2017    Radiology Vas US Duplex Dialysis Access (avf, Avg)  Result Date: 03/02/2019 DIALYSIS ACCESS Reason for Exam: Complication of access. Access Site: Left Upper Extremity. Access Type: Brachial-axillary AVG. History: 11/07/2018: Lt Brachial Axillary Arteriovenous Graft Placement;. Performing Technologist: Charlane Ferretti RT (R)(VS) Supporting Technologist: Blondell Reveal RT, RDMS, RVT  Examination Guidelines: A complete evaluation includes B-mode imaging, spectral Doppler, color Doppler, and power Doppler as needed of all accessible portions of each vessel. Unilateral testing is considered an integral part of a complete examination. Limited examinations for reoccurring indications may be performed as noted.  Findings:   +--------------------+----------+-----------------+----------------------------+ AVG                 PSV (cm/s)Flow Vol (mL/min)          Describe           +--------------------+----------+-----------------+----------------------------+ Native artery inflow   172          1342                                    +--------------------+----------+-----------------+----------------------------+ Arterial anastomosis   273                                                  +--------------------+----------+-----------------+----------------------------+ Prox graft  551                       partially-occlusive near                                                     anastomosis; residual                                                             lumen=0.29cm         +--------------------+----------+-----------------+----------------------------+ Mid graft              764                     partially-occlusive; vessel                                                    tapering; mild vessel                                                        tortuosity; residual                                                       lumen=0.35cm; proximal                                                         velocity=127cms/       +--------------------+----------+-----------------+----------------------------+ Distal graft           163                                stent             +--------------------+----------+-----------------+----------------------------+ Venous anastomosis     123                                stent             +--------------------+----------+-----------------+----------------------------+ Venous outflow          88                                stent             +--------------------+----------+-----------------+----------------------------+ Antegrade left distal radial artery flow.  Summary: Patent left brachial-axillary AVG with significant stenosis  noted in the graft near the arterial anastomosis and at the mid upper arm level. Normal Flow Volume noted. Velocities are increased at the mid AVG and unchanged near the anastomosis when compared to the previous exam on 11/20/18. Note: Patient to be scheduled for fistulagram based on the above findings at the request of Dr. Delana Meyer.  *See table(s) above for measurements and observations.  Diagnosing physician: [name] MD Electronically signed by [name] MD on 03/02/2019 at 5:06:14 PM.   --------------------------------------------------------------------------------   Final     Assessment/Plan 1.  Complication dialysis device with thrombosis AV access:  Patient's left arm dialysis access is malfunctioning. The patient will undergo  angiography and correction of any problems using interventional techniques with the hope of restoring function to the access.  The risks and benefits were described to the patient.  All questions were answered.  The patient agrees to proceed with angiography and intervention. Potassium will be drawn to ensure that it is an appropriate level prior to performing intervention. 2.  End-stage renal disease requiring hemodialysis:  Patient will continue dialysis therapy without further interruption if a successful intervention is not achieved then a tunneled catheter will be placed. Dialysis has already been arranged. 3.  Hypertension:  Patient will continue medical management; nephrology is following no changes in oral medications. 4. Diabetes mellitus:  Glucose will be monitored and oral medications been held this morning once the patient has undergone the patient's procedure po intake will be reinitiated and again Accu-Cheks will be used to assess the blood glucose level and treat as needed. The patient will be restarted on the patient's usual hypoglycemic regime    [name], MD  03/10/2019 8:02 AM

## 2019-03-10 NOTE — Progress Notes (Signed)
Right upper arm PIV infiltrated during VIR case per Altha Harm RN. PIV removed in VIR. Gauze at site, CDI. Pt denies pain at this time to removed PIV site.

## 2019-03-11 DIAGNOSIS — N186 End stage renal disease: Secondary | ICD-10-CM | POA: Diagnosis not present

## 2019-03-11 DIAGNOSIS — Z992 Dependence on renal dialysis: Secondary | ICD-10-CM | POA: Diagnosis not present

## 2019-03-13 DIAGNOSIS — Z992 Dependence on renal dialysis: Secondary | ICD-10-CM | POA: Diagnosis not present

## 2019-03-13 DIAGNOSIS — N186 End stage renal disease: Secondary | ICD-10-CM | POA: Diagnosis not present

## 2019-03-16 DIAGNOSIS — Z992 Dependence on renal dialysis: Secondary | ICD-10-CM | POA: Diagnosis not present

## 2019-03-16 DIAGNOSIS — N186 End stage renal disease: Secondary | ICD-10-CM | POA: Diagnosis not present

## 2019-03-18 DIAGNOSIS — N186 End stage renal disease: Secondary | ICD-10-CM | POA: Diagnosis not present

## 2019-03-18 DIAGNOSIS — Z992 Dependence on renal dialysis: Secondary | ICD-10-CM | POA: Diagnosis not present

## 2019-03-20 DIAGNOSIS — N186 End stage renal disease: Secondary | ICD-10-CM | POA: Diagnosis not present

## 2019-03-20 DIAGNOSIS — Z992 Dependence on renal dialysis: Secondary | ICD-10-CM | POA: Diagnosis not present

## 2019-03-23 DIAGNOSIS — Z992 Dependence on renal dialysis: Secondary | ICD-10-CM | POA: Diagnosis not present

## 2019-03-23 DIAGNOSIS — N186 End stage renal disease: Secondary | ICD-10-CM | POA: Diagnosis not present

## 2019-03-25 DIAGNOSIS — N186 End stage renal disease: Secondary | ICD-10-CM | POA: Diagnosis not present

## 2019-03-25 DIAGNOSIS — Z992 Dependence on renal dialysis: Secondary | ICD-10-CM | POA: Diagnosis not present

## 2019-03-27 DIAGNOSIS — N186 End stage renal disease: Secondary | ICD-10-CM | POA: Diagnosis not present

## 2019-03-27 DIAGNOSIS — Z992 Dependence on renal dialysis: Secondary | ICD-10-CM | POA: Diagnosis not present

## 2019-03-30 ENCOUNTER — Other Ambulatory Visit (INDEPENDENT_AMBULATORY_CARE_PROVIDER_SITE_OTHER): Payer: Self-pay | Admitting: Vascular Surgery

## 2019-03-30 DIAGNOSIS — Z95820 Peripheral vascular angioplasty status with implants and grafts: Secondary | ICD-10-CM

## 2019-03-30 DIAGNOSIS — N186 End stage renal disease: Secondary | ICD-10-CM | POA: Diagnosis not present

## 2019-03-30 DIAGNOSIS — Z992 Dependence on renal dialysis: Secondary | ICD-10-CM | POA: Diagnosis not present

## 2019-03-30 DIAGNOSIS — E119 Type 2 diabetes mellitus without complications: Secondary | ICD-10-CM | POA: Diagnosis not present

## 2019-03-31 ENCOUNTER — Other Ambulatory Visit: Payer: Self-pay

## 2019-03-31 ENCOUNTER — Ambulatory Visit (INDEPENDENT_AMBULATORY_CARE_PROVIDER_SITE_OTHER): Payer: Medicare HMO | Admitting: Vascular Surgery

## 2019-03-31 ENCOUNTER — Encounter (INDEPENDENT_AMBULATORY_CARE_PROVIDER_SITE_OTHER): Payer: Self-pay | Admitting: Vascular Surgery

## 2019-03-31 ENCOUNTER — Ambulatory Visit (INDEPENDENT_AMBULATORY_CARE_PROVIDER_SITE_OTHER): Payer: Medicare HMO

## 2019-03-31 VITALS — BP 128/73 | HR 75 | Resp 17 | Ht 63.0 in | Wt 240.0 lb

## 2019-03-31 DIAGNOSIS — E1121 Type 2 diabetes mellitus with diabetic nephropathy: Secondary | ICD-10-CM

## 2019-03-31 DIAGNOSIS — Z95820 Peripheral vascular angioplasty status with implants and grafts: Secondary | ICD-10-CM | POA: Diagnosis not present

## 2019-03-31 DIAGNOSIS — N186 End stage renal disease: Secondary | ICD-10-CM | POA: Diagnosis not present

## 2019-03-31 DIAGNOSIS — I10 Essential (primary) hypertension: Secondary | ICD-10-CM

## 2019-03-31 NOTE — Progress Notes (Signed)
MRN : [id number]  Carrie [name] is a 68 y.o. (1950/06/26) female who presents with chief complaint of  Chief Complaint  Patient presents with  . Follow-up    ultrasound  .  History of Present Illness: Patient returns today in follow up of her dialysis access.  About 3 weeks ago, she underwent angioplasty of a mid graft stenosis with significant improvement in her dialysis access function since that time.  She says it is currently working well and they are not having any difficulties with access or low flow rates. Duplex today shows some elevated velocities near the arterial anastomosis but the previous mid graft stenosis is resolved with markedly improved flow.  Her flow volumes are good.   Current Outpatient Medications  Medication Sig Dispense Refill  . aspirin EC 81 MG tablet Take 81 mg by mouth daily.    [name] levETIRAcetam (KEPPRA) 1000 MG tablet TAKE 1 TABLET BY MOUTH TWICE A DAY 180 tablet 3  . lidocaine-prilocaine (EMLA) cream Apply 1 application topically every Monday, Wednesday, and Friday with hemodialysis.    [name] LORazepam (ATIVAN) 0.5 MG tablet TAKE 1 TABLET (0.5 MG TOTAL) BY MOUTH 2 (TWO) TIMES DAILY AS NEEDED. 60 tablet 0  . losartan (COZAAR) 25 MG tablet Take 25 mg by mouth daily.    . metoprolol succinate (TOPROL-XL) 25 MG 24 hr tablet Take 1 tablet (25 mg total) by mouth daily. 30 tablet 0  . metoprolol tartrate (LOPRESSOR) 25 MG tablet Take 25 mg by mouth daily.    . sevelamer carbonate (RENVELA) 800 MG tablet Take 800-1,600 mg by mouth See admin instructions. Take 2 tablets (1600 mg) by mouth with each meal & 1 tablet (800 mg) by mouth with each snack    . torsemide (DEMADEX) 20 MG tablet Take 20 mg by mouth 4 (four) times a week. Take 1 tablet (20 mg) by mouth on Tuesdays, Thursdays, Saturdays, & Sundays in the morning    . atorvastatin (LIPITOR) 40 MG tablet TAKE 40 MG BY MOUTH ONCE DAILY (Patient not taking: No sig reported) 90 tablet 3  . multivitamin (RENA-VIT) TABS  tablet Take 1 tablet by mouth at bedtime. 30 tablet 0   No current facility-administered medications for this visit.     Past Medical History:  Diagnosis Date  . Allergy   . Anemia   . Arthritis    "right knee" (10/26'/2017)  . CKD (chronic kidney disease), stage III    12/2015 (Dr. Lavonia Dana)  . Depression   . History of blood transfusion 2008   "when I had brain tumor surgery"  . History of MRSA infection 2008  . Hypertension   . Meningioma ([place])    Foramen magnum  . Type II diabetes mellitus ([place]) 2011   on metformin until yr ago- no med now - off due to kidneys    Past Surgical History:  Procedure Laterality Date  . A/V FISTULAGRAM Left 03/10/2019   Procedure: A/V FISTULAGRAM;  Surgeon: Katha Cabal, MD;  Location: [facility];  Service: Cardiovascular;  Laterality: Left;  . AV FISTULA PLACEMENT Left 11/07/2018   Procedure: ARTERIOVENOUS (AV) FISTULA CREATION ( BRACHIO BASILIC ) VS BRACHIAL AXILLARY GRAFT;  Surgeon: Katha Cabal, MD;  Location: [facility];  Service: Vascular;  Laterality: Left;  . BRAIN MENINGIOMA EXCISION  08/2006   Foramina magnum meningioma  . CERVICAL DISCECTOMY  1996   Dr. Alric Seton  . CESAREAN SECTION  1981  . COLONOSCOPY    .  DIALYSIS/PERMA CATHETER INSERTION N/A 08/04/2018   Procedure: DIALYSIS/PERMA CATHETER INSERTION;  Surgeon: Algernon Huxley, MD;  Location: [facility];  Service: Cardiovascular;  Laterality: N/A;  . DIALYSIS/PERMA CATHETER REMOVAL N/A 12/25/2018   Procedure: DIALYSIS/PERMA CATHETER REMOVAL;  Surgeon: Algernon Huxley, MD;  Location: [facility];  Service: Cardiovascular;  Laterality: N/A;  . DILATION AND CURETTAGE OF UTERUS    . JOINT REPLACEMENT     right knee left hip  . Right wrist x-ray  2007   Deg. at 1st MCP  . TOTAL HIP ARTHROPLASTY Left 03/27/2016   Procedure: LEFT TOTAL HIP ARTHROPLASTY ANTERIOR APPROACH;  Surgeon: Mcarthur Rossetti, MD;  Location: [facility];  Service: Orthopedics;   Laterality: Left;  . TOTAL KNEE ARTHROPLASTY Right 04/30/2017   Procedure: RIGHT TOTAL KNEE ARTHROPLASTY;  Surgeon: Mcarthur Rossetti, MD;  Location: [facility];  Service: Orthopedics;  Laterality: Right;  . TUBAL LIGATION  1981    Social History Social History   Tobacco Use  . Smoking status: Never Smoker  . Smokeless tobacco: Never Used  Substance Use Topics  . Alcohol use: No    Alcohol/week: 0.0 standard drinks  . Drug use: No    Family History Family History  Problem Relation Age of Onset  . Hypertension Other        Strong family history   . Breast cancer Mother 57  . Heart failure Sister   . Heart disease Sister     Allergies  Allergen Reactions  . Naproxen Sodium Anaphylaxis and Swelling  . Sulfamethoxazole-Trimethoprim Anaphylaxis and Swelling     REVIEW OF SYSTEMS (Negative unless checked)  Constitutional: [] Weight loss  [] Fever  [] Chills Cardiac: [] Chest pain   [] Chest pressure   [] Palpitations   [] Shortness of breath when laying flat   [] Shortness of breath at rest   [] Shortness of breath with exertion. Vascular:  [] Pain in legs with walking   [] Pain in legs at rest   [] Pain in legs when laying flat   [] Claudication   [] Pain in feet when walking  [] Pain in feet at rest  [] Pain in feet when laying flat   [] History of DVT   [] Phlebitis   [] Swelling in legs   [] Varicose veins   [] Non-healing ulcers Pulmonary:   [] Uses home oxygen   [] Productive cough   [] Hemoptysis   [] Wheeze  [] COPD   [] Asthma Neurologic:  [] Dizziness  [] Blackouts   [] Seizures   [] History of stroke   [] History of TIA  [] Aphasia   [] Temporary blindness   [] Dysphagia   [] Weakness or numbness in arms   [] Weakness or numbness in legs Musculoskeletal:  [x] Arthritis   [] Joint swelling   [] Joint pain   [] Low back pain Hematologic:  [] Easy bruising  [] Easy bleeding   [] Hypercoagulable state   [x] Anemic   Gastrointestinal:  [] Blood in stool   [] Vomiting blood  [] Gastroesophageal reflux/heartburn    [] Abdominal pain Genitourinary:  [x] Chronic kidney disease   [] Difficult urination  [] Frequent urination  [] Burning with urination   [] Hematuria Skin:  [] Rashes   [] Ulcers   [] Wounds Psychological:  [] History of anxiety   []  History of major depression.  Physical Examination  BP 128/73 (BP Location: Right Arm)   Pulse 75   Resp 17   Ht 5\' 3"  (1.6 m)   Wt 240 lb (108.9 kg)   BMI 42.51 kg/m  Gen:  WD/WN, NAD. Obese  Head: [place]/AT, No temporalis wasting. Ear/Nose/Throat: Hearing grossly intact, nares w/o erythema or drainage Eyes: Conjunctiva  clear. Sclera non-icteric Neck: Supple.  Trachea midline Pulmonary:  Good air movement, no use of accessory muscles.  Cardiac: RRR, no JVD Vascular:  Vessel Right Left  Radial Palpable Palpable               Musculoskeletal: M/S 5/5 throughout.  No deformity or atrophy. Good thrill left arm AVG. Mild LE edema. Neurologic: Sensation grossly intact in extremities.  Symmetrical.  Speech is fluent.  Psychiatric: Judgment intact, Mood & affect appropriate for pt's clinical situation. Dermatologic: No rashes or ulcers noted.  No cellulitis or open wounds.       Labs Recent Results (from the past 2160 hour(s))  HM DIABETES FOOT EXAM     Status: None   Collection Time: 01/27/19 12:00 AM  Result Value Ref Range   HM Diabetic Foot Exam done   SARS CORONAVIRUS 2 (TAT 6-24 HRS) Nasopharyngeal Nasopharyngeal Swab     Status: None   Collection Time: 03/05/19  1:21 PM   Specimen: Nasopharyngeal Swab  Result Value Ref Range   SARS Coronavirus 2 NEGATIVE NEGATIVE    Comment: (NOTE) SARS-CoV-2 target nucleic acids are NOT DETECTED. The SARS-CoV-2 RNA is generally detectable in upper and lower respiratory specimens during the acute phase of infection. Negative results do not preclude SARS-CoV-2 infection, do not rule out co-infections with other pathogens, and should not be used as the sole basis for treatment or other patient management  decisions. Negative results must be combined with clinical observations, patient history, and epidemiological information. The expected result is Negative. Fact Sheet for Patients: SugarRoll.be Fact Sheet for Healthcare Providers: https://www.woods-mathews.com/ This test is not yet approved or cleared by the [place] FDA and  has been authorized for detection and/or diagnosis of SARS-CoV-2 by FDA under an Emergency Use Authorization (EUA). This EUA will remain  in effect (meaning this test can be used) for the duration of the COVID-19 declaration under Section 56 4(b)(1) of the Act, 21 U.S.C. section 360bbb-3(b)(1), unless the authorization is terminated or revoked sooner. Performed at [facility], [place] [address] [place], [place] [address]   Glucose, capillary     Status: Abnormal   Collection Time: 03/10/19  7:20 AM  Result Value Ref Range   Glucose-Capillary 109 (H) 70 - 99 mg/dL  Potassium [facility] vascular lab only)     Status: None   Collection Time: 03/10/19  7:29 AM  Result Value Ref Range   Potassium [facility] vascular lab) 4.5 3.5 - 5.1    Comment: Performed at [facility], [address] [place], [place] [address]  Glucose, capillary     Status: Abnormal   Collection Time: 03/10/19  9:28 AM  Result Value Ref Range   Glucose-Capillary 105 (H) 70 - 99 mg/dL    Radiology No results found.  Assessment/Plan  Essential hypertension, benign blood pressure control important in reducing the progression of atherosclerotic disease. On appropriate oral medications.   Type 2 diabetes, controlled, with renal manifestation (HCC) blood glucose control important in reducing the progression of atherosclerotic disease. Also, involved in wound healing. On appropriate medications.   ESRD (end stage renal disease) ([place]) Duplex today shows some elevated velocities near the arterial anastomosis but the previous mid graft  stenosis is resolved with markedly improved flow.  Her flow volumes are good.  Her access is currently working fairly well so we will not perform any intervention at this time.  I will keep her on a relatively short interval follow-up of about 3  months to monitor the residual stenosis within the graft.    Leotis Pain, MD  03/31/2019 2:58 PM    This note was created with Dragon medical transcription system.  Any errors from dictation are purely unintentional

## 2019-03-31 NOTE — Assessment & Plan Note (Signed)
blood pressure control important in reducing the progression of atherosclerotic disease. On appropriate oral medications.  

## 2019-03-31 NOTE — Assessment & Plan Note (Signed)
Duplex today shows some elevated velocities near the arterial anastomosis but the previous mid graft stenosis is resolved with markedly improved flow.  Her flow volumes are good.  Her access is currently working fairly well so we will not perform any intervention at this time.  I will keep her on a relatively short interval follow-up of about 3 months to monitor the residual stenosis within the graft.

## 2019-03-31 NOTE — Assessment & Plan Note (Signed)
blood glucose control important in reducing the progression of atherosclerotic disease. Also, involved in wound healing. On appropriate medications.  

## 2019-04-01 DIAGNOSIS — N186 End stage renal disease: Secondary | ICD-10-CM | POA: Diagnosis not present

## 2019-04-01 DIAGNOSIS — Z992 Dependence on renal dialysis: Secondary | ICD-10-CM | POA: Diagnosis not present

## 2019-04-03 DIAGNOSIS — N186 End stage renal disease: Secondary | ICD-10-CM | POA: Diagnosis not present

## 2019-04-03 DIAGNOSIS — Z992 Dependence on renal dialysis: Secondary | ICD-10-CM | POA: Diagnosis not present

## 2019-04-04 DIAGNOSIS — N186 End stage renal disease: Secondary | ICD-10-CM | POA: Diagnosis not present

## 2019-04-04 DIAGNOSIS — Z992 Dependence on renal dialysis: Secondary | ICD-10-CM | POA: Diagnosis not present

## 2019-04-06 DIAGNOSIS — N186 End stage renal disease: Secondary | ICD-10-CM | POA: Diagnosis not present

## 2019-04-06 DIAGNOSIS — Z992 Dependence on renal dialysis: Secondary | ICD-10-CM | POA: Diagnosis not present

## 2019-04-08 DIAGNOSIS — Z992 Dependence on renal dialysis: Secondary | ICD-10-CM | POA: Diagnosis not present

## 2019-04-08 DIAGNOSIS — N186 End stage renal disease: Secondary | ICD-10-CM | POA: Diagnosis not present

## 2019-04-09 DIAGNOSIS — I5032 Chronic diastolic (congestive) heart failure: Secondary | ICD-10-CM | POA: Diagnosis not present

## 2019-04-10 DIAGNOSIS — Z992 Dependence on renal dialysis: Secondary | ICD-10-CM | POA: Diagnosis not present

## 2019-04-10 DIAGNOSIS — N186 End stage renal disease: Secondary | ICD-10-CM | POA: Diagnosis not present

## 2019-04-13 DIAGNOSIS — Z992 Dependence on renal dialysis: Secondary | ICD-10-CM | POA: Diagnosis not present

## 2019-04-13 DIAGNOSIS — N186 End stage renal disease: Secondary | ICD-10-CM | POA: Diagnosis not present

## 2019-04-15 DIAGNOSIS — Z992 Dependence on renal dialysis: Secondary | ICD-10-CM | POA: Diagnosis not present

## 2019-04-15 DIAGNOSIS — N186 End stage renal disease: Secondary | ICD-10-CM | POA: Diagnosis not present

## 2019-04-17 DIAGNOSIS — Z992 Dependence on renal dialysis: Secondary | ICD-10-CM | POA: Diagnosis not present

## 2019-04-17 DIAGNOSIS — N186 End stage renal disease: Secondary | ICD-10-CM | POA: Diagnosis not present

## 2019-04-20 DIAGNOSIS — N186 End stage renal disease: Secondary | ICD-10-CM | POA: Diagnosis not present

## 2019-04-20 DIAGNOSIS — Z992 Dependence on renal dialysis: Secondary | ICD-10-CM | POA: Diagnosis not present

## 2019-04-22 DIAGNOSIS — N186 End stage renal disease: Secondary | ICD-10-CM | POA: Diagnosis not present

## 2019-04-22 DIAGNOSIS — Z992 Dependence on renal dialysis: Secondary | ICD-10-CM | POA: Diagnosis not present

## 2019-04-24 DIAGNOSIS — N186 End stage renal disease: Secondary | ICD-10-CM | POA: Diagnosis not present

## 2019-04-24 DIAGNOSIS — Z992 Dependence on renal dialysis: Secondary | ICD-10-CM | POA: Diagnosis not present

## 2019-04-26 DIAGNOSIS — N186 End stage renal disease: Secondary | ICD-10-CM | POA: Diagnosis not present

## 2019-04-26 DIAGNOSIS — Z992 Dependence on renal dialysis: Secondary | ICD-10-CM | POA: Diagnosis not present

## 2019-04-28 DIAGNOSIS — N186 End stage renal disease: Secondary | ICD-10-CM | POA: Diagnosis not present

## 2019-04-28 DIAGNOSIS — Z992 Dependence on renal dialysis: Secondary | ICD-10-CM | POA: Diagnosis not present

## 2019-05-01 DIAGNOSIS — N186 End stage renal disease: Secondary | ICD-10-CM | POA: Diagnosis not present

## 2019-05-01 DIAGNOSIS — Z992 Dependence on renal dialysis: Secondary | ICD-10-CM | POA: Diagnosis not present

## 2019-05-04 DIAGNOSIS — Z992 Dependence on renal dialysis: Secondary | ICD-10-CM | POA: Diagnosis not present

## 2019-05-04 DIAGNOSIS — N186 End stage renal disease: Secondary | ICD-10-CM | POA: Diagnosis not present

## 2019-05-06 ENCOUNTER — Other Ambulatory Visit: Payer: Self-pay | Admitting: Internal Medicine

## 2019-05-06 DIAGNOSIS — N186 End stage renal disease: Secondary | ICD-10-CM | POA: Diagnosis not present

## 2019-05-06 DIAGNOSIS — Z992 Dependence on renal dialysis: Secondary | ICD-10-CM | POA: Diagnosis not present

## 2019-05-07 NOTE — Telephone Encounter (Signed)
Last filled 03-10-19 #60 Last OV 01-27-19 Next OV 02-02-20 [facility]

## 2019-05-08 DIAGNOSIS — Z992 Dependence on renal dialysis: Secondary | ICD-10-CM | POA: Diagnosis not present

## 2019-05-08 DIAGNOSIS — N186 End stage renal disease: Secondary | ICD-10-CM | POA: Diagnosis not present

## 2019-05-09 DIAGNOSIS — I5032 Chronic diastolic (congestive) heart failure: Secondary | ICD-10-CM | POA: Diagnosis not present

## 2019-05-11 DIAGNOSIS — N186 End stage renal disease: Secondary | ICD-10-CM | POA: Diagnosis not present

## 2019-05-11 DIAGNOSIS — Z992 Dependence on renal dialysis: Secondary | ICD-10-CM | POA: Diagnosis not present

## 2019-05-13 DIAGNOSIS — Z992 Dependence on renal dialysis: Secondary | ICD-10-CM | POA: Diagnosis not present

## 2019-05-13 DIAGNOSIS — N186 End stage renal disease: Secondary | ICD-10-CM | POA: Diagnosis not present

## 2019-05-15 DIAGNOSIS — Z992 Dependence on renal dialysis: Secondary | ICD-10-CM | POA: Diagnosis not present

## 2019-05-15 DIAGNOSIS — N186 End stage renal disease: Secondary | ICD-10-CM | POA: Diagnosis not present

## 2019-05-18 DIAGNOSIS — N186 End stage renal disease: Secondary | ICD-10-CM | POA: Diagnosis not present

## 2019-05-18 DIAGNOSIS — Z992 Dependence on renal dialysis: Secondary | ICD-10-CM | POA: Diagnosis not present

## 2019-05-20 DIAGNOSIS — Z992 Dependence on renal dialysis: Secondary | ICD-10-CM | POA: Diagnosis not present

## 2019-05-20 DIAGNOSIS — N186 End stage renal disease: Secondary | ICD-10-CM | POA: Diagnosis not present

## 2019-05-22 DIAGNOSIS — Z992 Dependence on renal dialysis: Secondary | ICD-10-CM | POA: Diagnosis not present

## 2019-05-22 DIAGNOSIS — N186 End stage renal disease: Secondary | ICD-10-CM | POA: Diagnosis not present

## 2019-05-25 DIAGNOSIS — Z992 Dependence on renal dialysis: Secondary | ICD-10-CM | POA: Diagnosis not present

## 2019-05-25 DIAGNOSIS — N186 End stage renal disease: Secondary | ICD-10-CM | POA: Diagnosis not present

## 2019-05-27 DIAGNOSIS — N186 End stage renal disease: Secondary | ICD-10-CM | POA: Diagnosis not present

## 2019-05-27 DIAGNOSIS — Z992 Dependence on renal dialysis: Secondary | ICD-10-CM | POA: Diagnosis not present

## 2019-05-30 DIAGNOSIS — N186 End stage renal disease: Secondary | ICD-10-CM | POA: Diagnosis not present

## 2019-05-30 DIAGNOSIS — Z992 Dependence on renal dialysis: Secondary | ICD-10-CM | POA: Diagnosis not present

## 2019-06-01 DIAGNOSIS — N186 End stage renal disease: Secondary | ICD-10-CM | POA: Diagnosis not present

## 2019-06-01 DIAGNOSIS — Z992 Dependence on renal dialysis: Secondary | ICD-10-CM | POA: Diagnosis not present

## 2019-06-03 DIAGNOSIS — N186 End stage renal disease: Secondary | ICD-10-CM | POA: Diagnosis not present

## 2019-06-03 DIAGNOSIS — Z992 Dependence on renal dialysis: Secondary | ICD-10-CM | POA: Diagnosis not present

## 2019-06-04 ENCOUNTER — Ambulatory Visit (INDEPENDENT_AMBULATORY_CARE_PROVIDER_SITE_OTHER): Payer: Medicare HMO | Admitting: Vascular Surgery

## 2019-06-04 ENCOUNTER — Encounter (INDEPENDENT_AMBULATORY_CARE_PROVIDER_SITE_OTHER): Payer: Medicare HMO

## 2019-06-04 DIAGNOSIS — N186 End stage renal disease: Secondary | ICD-10-CM | POA: Diagnosis not present

## 2019-06-04 DIAGNOSIS — Z992 Dependence on renal dialysis: Secondary | ICD-10-CM | POA: Diagnosis not present

## 2019-06-05 DIAGNOSIS — Z992 Dependence on renal dialysis: Secondary | ICD-10-CM | POA: Diagnosis not present

## 2019-06-05 DIAGNOSIS — N186 End stage renal disease: Secondary | ICD-10-CM | POA: Diagnosis not present

## 2019-06-08 DIAGNOSIS — Z992 Dependence on renal dialysis: Secondary | ICD-10-CM | POA: Diagnosis not present

## 2019-06-08 DIAGNOSIS — N186 End stage renal disease: Secondary | ICD-10-CM | POA: Diagnosis not present

## 2019-06-09 DIAGNOSIS — I5032 Chronic diastolic (congestive) heart failure: Secondary | ICD-10-CM | POA: Diagnosis not present

## 2019-06-10 DIAGNOSIS — N186 End stage renal disease: Secondary | ICD-10-CM | POA: Diagnosis not present

## 2019-06-10 DIAGNOSIS — Z992 Dependence on renal dialysis: Secondary | ICD-10-CM | POA: Diagnosis not present

## 2019-06-12 DIAGNOSIS — Z992 Dependence on renal dialysis: Secondary | ICD-10-CM | POA: Diagnosis not present

## 2019-06-12 DIAGNOSIS — N186 End stage renal disease: Secondary | ICD-10-CM | POA: Diagnosis not present

## 2019-06-15 DIAGNOSIS — Z992 Dependence on renal dialysis: Secondary | ICD-10-CM | POA: Diagnosis not present

## 2019-06-15 DIAGNOSIS — N186 End stage renal disease: Secondary | ICD-10-CM | POA: Diagnosis not present

## 2019-06-17 DIAGNOSIS — Z992 Dependence on renal dialysis: Secondary | ICD-10-CM | POA: Diagnosis not present

## 2019-06-17 DIAGNOSIS — N186 End stage renal disease: Secondary | ICD-10-CM | POA: Diagnosis not present

## 2019-06-18 ENCOUNTER — Encounter (INDEPENDENT_AMBULATORY_CARE_PROVIDER_SITE_OTHER): Payer: Medicare HMO

## 2019-06-18 ENCOUNTER — Ambulatory Visit (INDEPENDENT_AMBULATORY_CARE_PROVIDER_SITE_OTHER): Payer: Medicare HMO | Admitting: Vascular Surgery

## 2019-06-19 ENCOUNTER — Ambulatory Visit: Payer: Medicare HMO | Attending: Internal Medicine

## 2019-06-19 DIAGNOSIS — Z20822 Contact with and (suspected) exposure to covid-19: Secondary | ICD-10-CM | POA: Diagnosis not present

## 2019-06-19 DIAGNOSIS — N186 End stage renal disease: Secondary | ICD-10-CM | POA: Diagnosis not present

## 2019-06-19 DIAGNOSIS — Z992 Dependence on renal dialysis: Secondary | ICD-10-CM | POA: Diagnosis not present

## 2019-06-20 LAB — NOVEL CORONAVIRUS, NAA: SARS-CoV-2, NAA: NOT DETECTED

## 2019-06-22 DIAGNOSIS — Z992 Dependence on renal dialysis: Secondary | ICD-10-CM | POA: Diagnosis not present

## 2019-06-22 DIAGNOSIS — N186 End stage renal disease: Secondary | ICD-10-CM | POA: Diagnosis not present

## 2019-06-24 DIAGNOSIS — Z992 Dependence on renal dialysis: Secondary | ICD-10-CM | POA: Diagnosis not present

## 2019-06-24 DIAGNOSIS — N186 End stage renal disease: Secondary | ICD-10-CM | POA: Diagnosis not present

## 2019-06-26 DIAGNOSIS — Z992 Dependence on renal dialysis: Secondary | ICD-10-CM | POA: Diagnosis not present

## 2019-06-26 DIAGNOSIS — N186 End stage renal disease: Secondary | ICD-10-CM | POA: Diagnosis not present

## 2019-06-29 DIAGNOSIS — Z992 Dependence on renal dialysis: Secondary | ICD-10-CM | POA: Diagnosis not present

## 2019-06-29 DIAGNOSIS — E119 Type 2 diabetes mellitus without complications: Secondary | ICD-10-CM | POA: Diagnosis not present

## 2019-06-29 DIAGNOSIS — N186 End stage renal disease: Secondary | ICD-10-CM | POA: Diagnosis not present

## 2019-07-01 DIAGNOSIS — Z992 Dependence on renal dialysis: Secondary | ICD-10-CM | POA: Diagnosis not present

## 2019-07-01 DIAGNOSIS — N186 End stage renal disease: Secondary | ICD-10-CM | POA: Diagnosis not present

## 2019-07-02 ENCOUNTER — Ambulatory Visit (INDEPENDENT_AMBULATORY_CARE_PROVIDER_SITE_OTHER): Payer: Medicare HMO

## 2019-07-02 ENCOUNTER — Encounter (INDEPENDENT_AMBULATORY_CARE_PROVIDER_SITE_OTHER): Payer: Self-pay | Admitting: Vascular Surgery

## 2019-07-02 ENCOUNTER — Other Ambulatory Visit: Payer: Self-pay

## 2019-07-02 ENCOUNTER — Ambulatory Visit (INDEPENDENT_AMBULATORY_CARE_PROVIDER_SITE_OTHER): Payer: Medicare HMO | Admitting: Vascular Surgery

## 2019-07-02 VITALS — BP 99/59 | HR 80 | Resp 20 | Ht 63.0 in | Wt 243.0 lb

## 2019-07-02 DIAGNOSIS — E1121 Type 2 diabetes mellitus with diabetic nephropathy: Secondary | ICD-10-CM | POA: Diagnosis not present

## 2019-07-02 DIAGNOSIS — I5032 Chronic diastolic (congestive) heart failure: Secondary | ICD-10-CM | POA: Diagnosis not present

## 2019-07-02 DIAGNOSIS — N186 End stage renal disease: Secondary | ICD-10-CM

## 2019-07-02 DIAGNOSIS — I10 Essential (primary) hypertension: Secondary | ICD-10-CM

## 2019-07-02 DIAGNOSIS — F39 Unspecified mood [affective] disorder: Secondary | ICD-10-CM | POA: Diagnosis not present

## 2019-07-02 DIAGNOSIS — D329 Benign neoplasm of meninges, unspecified: Secondary | ICD-10-CM | POA: Diagnosis not present

## 2019-07-02 DIAGNOSIS — Z6841 Body Mass Index (BMI) 40.0 and over, adult: Secondary | ICD-10-CM

## 2019-07-02 DIAGNOSIS — T829XXA Unspecified complication of cardiac and vascular prosthetic device, implant and graft, initial encounter: Secondary | ICD-10-CM | POA: Diagnosis not present

## 2019-07-02 NOTE — Progress Notes (Signed)
MRN : [id number]  Carrie [name] is a 69 y.o. (30-Mar-1951) female who presents with chief complaint of  Chief Complaint  Patient presents with  . Follow-up  .  History of Present Illness:   The patient returns to the office for followup status post intervention of the dialysis access 03/2019. Following the intervention the access function has been stable. The patient has not been experiencing increased bleeding times following decannulation and the patient denies increased recirculation. The patient denies an increase in arm swelling. At the present time the patient denies hand pain.  The patient denies amaurosis fugax or recent TIA symptoms. There are no recent neurological changes noted. The patient denies claudication symptoms or rest pain symptoms. The patient denies history of DVT, PE or superficial thrombophlebitis. The patient denies recent episodes of angina or shortness of breath.   Duplex ultrasound of the AV access shows a patent access.  The previously noted stenosis is much worse with psv of >800cm/sec; flow volumes are only 577.       Current Meds  Medication Sig  . aspirin EC 81 MG tablet Take 81 mg by mouth daily.  [name] atorvastatin (LIPITOR) 40 MG tablet TAKE 40 MG BY MOUTH ONCE DAILY  . levETIRAcetam (KEPPRA) 1000 MG tablet TAKE 1 TABLET BY MOUTH TWICE A DAY  . lidocaine-prilocaine (EMLA) cream Apply 1 application topically every Monday, Wednesday, and Friday with hemodialysis.  [name] LORazepam (ATIVAN) 0.5 MG tablet TAKE 1 TABLET (0.5 MG TOTAL) BY MOUTH 2 (TWO) TIMES DAILY AS NEEDED.  [name] losartan (COZAAR) 25 MG tablet Take 25 mg by mouth daily.  . metoprolol tartrate (LOPRESSOR) 25 MG tablet Take 25 mg by mouth daily.  . multivitamin (RENA-VIT) TABS tablet Take 1 tablet by mouth at bedtime.  . sevelamer carbonate (RENVELA) 800 MG tablet Take 800-1,600 mg by mouth See admin instructions. Take 2 tablets (1600 mg) by mouth with each meal & 1 tablet (800 mg) by mouth with  each snack  . torsemide (DEMADEX) 20 MG tablet Take 20 mg by mouth 4 (four) times a week. Take 1 tablet (20 mg) by mouth on Tuesdays, Thursdays, Saturdays, & Sundays in the morning    Past Medical History:  Diagnosis Date  . Allergy   . Anemia   . Arthritis    "right knee" (10/26'/2017)  . CKD (chronic kidney disease), stage III    12/2015 (Dr. Lavonia Dana)  . Depression   . History of blood transfusion 2008   "when I had brain tumor surgery"  . History of MRSA infection 2008  . Hypertension   . 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 Type II diabetes mellitus ([place]) 2011   on metformin until yr ago- no med now - off due to kidneys    Past Surgical History:  Procedure Laterality Date  . A/V FISTULAGRAM Left 03/10/2019   Procedure: A/V FISTULAGRAM;  Surgeon: Katha Cabal, MD;  Location: [facility];  Service: Cardiovascular;  Laterality: Left;  . AV FISTULA PLACEMENT Left 11/07/2018   Procedure: ARTERIOVENOUS (AV) FISTULA CREATION ( BRACHIO BASILIC ) VS BRACHIAL AXILLARY GRAFT;  Surgeon: Katha Cabal, MD;  Location: [facility];  Service: Vascular;  Laterality: Left;  . BRAIN MENINGIOMA EXCISION  08/2006   Foramina magnum meningioma  . CERVICAL DISCECTOMY  1996   Dr. Alric Seton  . CESAREAN SECTION  1981  . COLONOSCOPY    . DIALYSIS/PERMA CATHETER INSERTION N/A 08/04/2018   Procedure: DIALYSIS/PERMA CATHETER  INSERTION;  Surgeon: Algernon Huxley, MD;  Location: [facility];  Service: Cardiovascular;  Laterality: N/A;  . DIALYSIS/PERMA CATHETER REMOVAL N/A 12/25/2018   Procedure: DIALYSIS/PERMA CATHETER REMOVAL;  Surgeon: Algernon Huxley, MD;  Location: [facility];  Service: Cardiovascular;  Laterality: N/A;  . DILATION AND CURETTAGE OF UTERUS    . JOINT REPLACEMENT     right knee left hip  . Right wrist x-ray  2007   Deg. at 1st MCP  . TOTAL HIP ARTHROPLASTY Left 03/27/2016   Procedure: LEFT TOTAL HIP ARTHROPLASTY ANTERIOR APPROACH;  Surgeon: Mcarthur Rossetti, MD;  Location: [facility];  Service: Orthopedics;  Laterality: Left;  . TOTAL KNEE ARTHROPLASTY Right 04/30/2017   Procedure: RIGHT TOTAL KNEE ARTHROPLASTY;  Surgeon: Mcarthur Rossetti, MD;  Location: [facility];  Service: Orthopedics;  Laterality: Right;  . TUBAL LIGATION  1981    Social History Social History   Tobacco Use  . Smoking status: Never Smoker  . Smokeless tobacco: Never Used  Substance Use Topics  . Alcohol use: No    Alcohol/week: 0.0 standard drinks  . Drug use: No    Family History Family History  Problem Relation Age of Onset  . Hypertension Other        Strong family history   . Breast cancer Mother 60  . Heart failure Sister   . Heart disease Sister     Allergies  Allergen Reactions  . Naproxen Sodium Anaphylaxis and Swelling  . Sulfamethoxazole-Trimethoprim Anaphylaxis and Swelling     REVIEW OF SYSTEMS (Negative unless checked)  Constitutional: [] Weight loss  [] Fever  [] Chills Cardiac: [] Chest pain   [] Chest pressure   [] Palpitations   [] Shortness of breath when laying flat   [] Shortness of breath with exertion. Vascular:  [] Pain in legs with walking   [] Pain in legs at rest  [] History of DVT   [] Phlebitis   [] Swelling in legs   [] Varicose veins   [] Non-healing ulcers Pulmonary:   [] Uses home oxygen   [] Productive cough   [] Hemoptysis   [] Wheeze  [] COPD   [] Asthma Neurologic:  [] Dizziness   [] Seizures   [] History of stroke   [] History of TIA  [] Aphasia   [] Vissual changes   [] Weakness or numbness in arm   [] Weakness or numbness in leg Musculoskeletal:   [] Joint swelling   [] Joint pain   [] Low back pain Hematologic:  [] Easy bruising  [] Easy bleeding   [] Hypercoagulable state   [] Anemic Gastrointestinal:  [] Diarrhea   [] Vomiting  [x] Gastroesophageal reflux/heartburn   [] Difficulty swallowing. Genitourinary:  [x] Chronic kidney disease   [] Difficult urination  [] Frequent urination   [] Blood in urine Skin:  [] Rashes   [] Ulcers  Psychological:   [] History of anxiety   []  History of major depression.  Physical Examination  Vitals:   07/02/19 1135  BP: (!) 99/59  Pulse: 80  Resp: 20  Weight: 243 lb (110.2 kg)  Height: 5\' 3"  (1.6 m)   Body mass index is 43.05 kg/m. Gen: WD/WN, NAD Head: [place]/AT, No temporalis wasting.  Ear/Nose/Throat: Hearing grossly intact, nares w/o erythema or drainage Eyes: PER, EOMI, sclera nonicteric.  Neck: Supple, no large masses.   Pulmonary:  Good air movement, no audible wheezing bilaterally, no use of accessory muscles.  Cardiac: RRR, no JVD Vascular: left brachial axillary graft with poor thrill poor bruit Vessel Right Left  Radial Palpable Palpable  Brachial Palpable Palpable  Gastrointestinal: Non-distended. No guarding/no peritoneal signs.  Musculoskeletal: M/S 5/5 throughout.  No deformity  or atrophy.  Neurologic: CN 2-12 intact. Symmetrical.  Speech is fluent. Motor exam as listed above. Psychiatric: Judgment intact, Mood & affect appropriate for pt's clinical situation. Dermatologic: No rashes or ulcers noted.  No changes consistent with cellulitis.   CBC Lab Results  Component Value Date   WBC 5.8 11/04/2018   HGB 13.3 11/07/2018   HCT 39.0 11/07/2018   MCV 93.4 11/04/2018   PLT 278 11/04/2018    BMET    Component Value Date/Time   NA 137 11/07/2018 0641   K 3.7 11/07/2018 0641   CL 99 11/04/2018 1208   CO2 28 11/04/2018 1208   GLUCOSE 109 (H) 11/07/2018 0641   BUN 36 (H) 11/04/2018 1208   BUN 36 (A) 07/08/2017 0000   CREATININE 5.46 (H) 11/04/2018 1208   CALCIUM 9.0 11/04/2018 1208   GFRNONAA 7 (L) 11/04/2018 1208   GFRAA 9 (L) 11/04/2018 1208   CrCl cannot be calculated (Patient's most recent lab result is older than the maximum 21 days allowed.).  COAG Lab Results  Component Value Date   INR 1.0 11/04/2018   INR 1.61 05/03/2017   INR 1.37 05/02/2017    Radiology No results found.    Assessment/Plan 1. Complication from renal dialysis device, initial  encounter Recommend:  The patient is experiencing increasing problems with their dialysis access.  Patient should have a left arm fistulagram with the intention for intervention.  The intention for intervention is to restore appropriate flow and prevent thrombosis and possible loss of the access.  As well as improve the quality of dialysis therapy.  The risks, benefits and alternative therapies were reviewed in detail with the patient.  All questions were answered.  The patient agrees to proceed with angio/intervention.      2. ESRD (end stage renal disease) ([place]) At the present time the patient has adequate dialysis access.  Continue hemodialysis as ordered without interruption.  Avoid nephrotoxic medications and dehydration.  Further plans per nephrology  3. Controlled type 2 diabetes mellitus with diabetic nephropathy, without long-term current use of insulin (HCC) Continue hypoglycemic medications as already ordered, these medications have been reviewed and there are no changes at this time.  Hgb A1C to be monitored as already arranged by primary service   4. Essential hypertension, benign Continue antihypertensive medications as already ordered, these medications have been reviewed and there are no changes at this time.   5. Chronic diastolic CHF (congestive heart failure) (HCC) Continue cardiac and antihypertensive medications as already ordered and reviewed, no changes at this time.  Continue statin as ordered and reviewed, no changes at this time  Nitrates PRN for chest pain     [name], MD  07/02/2019 12:03 PM

## 2019-07-03 DIAGNOSIS — N186 End stage renal disease: Secondary | ICD-10-CM | POA: Diagnosis not present

## 2019-07-03 DIAGNOSIS — Z992 Dependence on renal dialysis: Secondary | ICD-10-CM | POA: Diagnosis not present

## 2019-07-05 DIAGNOSIS — Z992 Dependence on renal dialysis: Secondary | ICD-10-CM | POA: Diagnosis not present

## 2019-07-05 DIAGNOSIS — N186 End stage renal disease: Secondary | ICD-10-CM | POA: Diagnosis not present

## 2019-07-06 ENCOUNTER — Telehealth (INDEPENDENT_AMBULATORY_CARE_PROVIDER_SITE_OTHER): Payer: Self-pay

## 2019-07-06 ENCOUNTER — Other Ambulatory Visit: Payer: Self-pay | Admitting: Internal Medicine

## 2019-07-06 DIAGNOSIS — Z992 Dependence on renal dialysis: Secondary | ICD-10-CM | POA: Diagnosis not present

## 2019-07-06 DIAGNOSIS — N186 End stage renal disease: Secondary | ICD-10-CM | POA: Diagnosis not present

## 2019-07-06 NOTE — Telephone Encounter (Signed)
Spoke with Ecologist at [facility]. Patient is scheduled with Dr. Delana Meyer for a left arm fistulagram on 07/14/19 with a 9:00 am arrival time to the [facility]. Patient will do covid testing on 07/10/19 between 12:30-2:30 pm at the [facility]. Pre-procedure instructions will be faxed to [facility] for the patient.

## 2019-07-07 DIAGNOSIS — M9903 Segmental and somatic dysfunction of lumbar region: Secondary | ICD-10-CM | POA: Diagnosis not present

## 2019-07-07 DIAGNOSIS — M9904 Segmental and somatic dysfunction of sacral region: Secondary | ICD-10-CM | POA: Diagnosis not present

## 2019-07-07 DIAGNOSIS — M461 Sacroiliitis, not elsewhere classified: Secondary | ICD-10-CM | POA: Diagnosis not present

## 2019-07-07 NOTE — Telephone Encounter (Signed)
Last filled 05-07-19 #60 Last OV 01-27-19 Next OV 02-02-20 [facility]

## 2019-07-08 DIAGNOSIS — N186 End stage renal disease: Secondary | ICD-10-CM | POA: Diagnosis not present

## 2019-07-08 DIAGNOSIS — Z992 Dependence on renal dialysis: Secondary | ICD-10-CM | POA: Diagnosis not present

## 2019-07-10 ENCOUNTER — Other Ambulatory Visit: Payer: Self-pay

## 2019-07-10 ENCOUNTER — Other Ambulatory Visit
Admission: RE | Admit: 2019-07-10 | Discharge: 2019-07-10 | Disposition: A | Discharge status: Home / Self Care | Payer: Medicare HMO | Source: Ambulatory Visit | Attending: Vascular Surgery | Admitting: Vascular Surgery

## 2019-07-10 DIAGNOSIS — Z20822 Contact with and (suspected) exposure to covid-19: Secondary | ICD-10-CM | POA: Diagnosis not present

## 2019-07-10 DIAGNOSIS — I5032 Chronic diastolic (congestive) heart failure: Secondary | ICD-10-CM | POA: Diagnosis not present

## 2019-07-10 DIAGNOSIS — Z01812 Encounter for preprocedural laboratory examination: Secondary | ICD-10-CM | POA: Insufficient documentation

## 2019-07-10 DIAGNOSIS — Z992 Dependence on renal dialysis: Secondary | ICD-10-CM | POA: Diagnosis not present

## 2019-07-10 DIAGNOSIS — N186 End stage renal disease: Secondary | ICD-10-CM | POA: Diagnosis not present

## 2019-07-10 LAB — SARS CORONAVIRUS 2 (TAT 6-24 HRS): SARS Coronavirus 2: NEGATIVE

## 2019-07-13 DIAGNOSIS — Z992 Dependence on renal dialysis: Secondary | ICD-10-CM | POA: Diagnosis not present

## 2019-07-13 DIAGNOSIS — N186 End stage renal disease: Secondary | ICD-10-CM | POA: Diagnosis not present

## 2019-07-14 ENCOUNTER — Other Ambulatory Visit: Payer: Self-pay

## 2019-07-14 ENCOUNTER — Other Ambulatory Visit (INDEPENDENT_AMBULATORY_CARE_PROVIDER_SITE_OTHER): Payer: Self-pay | Admitting: Nurse Practitioner

## 2019-07-14 ENCOUNTER — Ambulatory Visit
Admission: RE | Admit: 2019-07-14 | Discharge: 2019-07-14 | Disposition: A | Discharge status: Home / Self Care | Payer: Medicare HMO | Attending: Vascular Surgery | Admitting: Vascular Surgery

## 2019-07-14 ENCOUNTER — Encounter
Admission: RE | Disposition: A | Discharge status: Home / Self Care | Payer: Self-pay | Source: Home / Self Care | Attending: Vascular Surgery

## 2019-07-14 ENCOUNTER — Encounter: Payer: Self-pay | Admitting: Vascular Surgery

## 2019-07-14 DIAGNOSIS — I132 Hypertensive heart and chronic kidney disease with heart failure and with stage 5 chronic kidney disease, or end stage renal disease: Secondary | ICD-10-CM | POA: Insufficient documentation

## 2019-07-14 DIAGNOSIS — N186 End stage renal disease: Secondary | ICD-10-CM | POA: Diagnosis not present

## 2019-07-14 DIAGNOSIS — F329 Major depressive disorder, single episode, unspecified: Secondary | ICD-10-CM | POA: Insufficient documentation

## 2019-07-14 DIAGNOSIS — Z7982 Long term (current) use of aspirin: Secondary | ICD-10-CM | POA: Diagnosis not present

## 2019-07-14 DIAGNOSIS — Z992 Dependence on renal dialysis: Secondary | ICD-10-CM | POA: Insufficient documentation

## 2019-07-14 DIAGNOSIS — E1122 Type 2 diabetes mellitus with diabetic chronic kidney disease: Secondary | ICD-10-CM | POA: Diagnosis not present

## 2019-07-14 DIAGNOSIS — T82898A Other specified complication of vascular prosthetic devices, implants and grafts, initial encounter: Secondary | ICD-10-CM | POA: Diagnosis not present

## 2019-07-14 DIAGNOSIS — Y841 Kidney dialysis as the cause of abnormal reaction of the patient, or of later complication, without mention of misadventure at the time of the procedure: Secondary | ICD-10-CM | POA: Insufficient documentation

## 2019-07-14 DIAGNOSIS — T82858A Stenosis of vascular prosthetic devices, implants and grafts, initial encounter: Secondary | ICD-10-CM | POA: Insufficient documentation

## 2019-07-14 DIAGNOSIS — I5032 Chronic diastolic (congestive) heart failure: Secondary | ICD-10-CM | POA: Diagnosis not present

## 2019-07-14 DIAGNOSIS — Z79899 Other long term (current) drug therapy: Secondary | ICD-10-CM | POA: Insufficient documentation

## 2019-07-14 HISTORY — PX: A/V FISTULAGRAM: CATH118298

## 2019-07-14 LAB — POTASSIUM (ARMC VASCULAR LAB ONLY): Potassium (ARMC vascular lab): 4 (ref 3.5–5.1)

## 2019-07-14 LAB — GLUCOSE, CAPILLARY: Glucose-Capillary: 86 mg/dL (ref 70–99)

## 2019-07-14 SURGERY — A/V FISTULAGRAM
Anesthesia: Moderate Sedation | Laterality: Left

## 2019-07-14 MED ORDER — DIPHENHYDRAMINE HCL 50 MG/ML IJ SOLN: 50.0000 mg | Freq: Once | INTRAMUSCULAR | Status: DC | PRN

## 2019-07-14 MED ORDER — METHYLPREDNISOLONE SODIUM SUCC 125 MG IJ SOLR: 125.0000 mg | Freq: Once | INTRAMUSCULAR | Status: DC | PRN

## 2019-07-14 MED ORDER — MIDAZOLAM HCL 2 MG/2ML IJ SOLN
INTRAMUSCULAR | Status: DC | PRN
  Administered 2019-07-14: 1 mg via INTRAVENOUS
  Administered 2019-07-14: 2 mg via INTRAVENOUS

## 2019-07-14 MED ORDER — MIDAZOLAM HCL 2 MG/ML PO SYRP: 8.0000 mg | ORAL_SOLUTION | Freq: Once | ORAL | Status: DC | PRN

## 2019-07-14 MED ORDER — FAMOTIDINE 20 MG PO TABS: 40.0000 mg | ORAL_TABLET | Freq: Once | ORAL | Status: DC | PRN

## 2019-07-14 MED ORDER — ACETAMINOPHEN 325 MG PO TABS: 650.0000 mg | ORAL_TABLET | Freq: Once | ORAL | Status: DC

## 2019-07-14 MED ORDER — CEFAZOLIN SODIUM-DEXTROSE 1-4 GM/50ML-% IV SOLN: 1.0000 g | Freq: Once | INTRAVENOUS | Status: AC

## 2019-07-14 MED ORDER — FENTANYL CITRATE (PF) 100 MCG/2ML IJ SOLN
INTRAMUSCULAR | Status: DC | PRN
  Administered 2019-07-14 (×2): 50 ug via INTRAVENOUS

## 2019-07-14 MED ORDER — ONDANSETRON HCL 4 MG/2ML IJ SOLN: 4.0000 mg | Freq: Four times a day (QID) | INTRAMUSCULAR | Status: DC | PRN

## 2019-07-14 MED ORDER — FENTANYL CITRATE (PF) 100 MCG/2ML IJ SOLN
INTRAMUSCULAR | Status: AC
  Filled 2019-07-14: qty 2

## 2019-07-14 MED ORDER — HEPARIN SODIUM (PORCINE) 1000 UNIT/ML IJ SOLN
INTRAMUSCULAR | Status: DC | PRN
  Administered 2019-07-14: 3000 [IU] via INTRAVENOUS

## 2019-07-14 MED ORDER — MIDAZOLAM HCL 5 MG/5ML IJ SOLN
INTRAMUSCULAR | Status: AC
  Filled 2019-07-14: qty 5

## 2019-07-14 MED ORDER — SODIUM CHLORIDE 0.9 % IV SOLN: INTRAVENOUS | Status: DC

## 2019-07-14 MED ORDER — ACETAMINOPHEN 325 MG PO TABS
ORAL_TABLET | ORAL | Status: AC
  Administered 2019-07-14: 1000 mg via ORAL
  Filled 2019-07-14: qty 2

## 2019-07-14 MED ORDER — CEFAZOLIN SODIUM-DEXTROSE 1-4 GM/50ML-% IV SOLN
INTRAVENOUS | Status: AC
  Administered 2019-07-14: 1 g via INTRAVENOUS
  Filled 2019-07-14: qty 50

## 2019-07-14 MED ORDER — ACETAMINOPHEN 500 MG PO TABS
ORAL_TABLET | ORAL | Status: AC
  Filled 2019-07-14: qty 2

## 2019-07-14 MED ORDER — ACETAMINOPHEN 500 MG PO TABS: 1000.0000 mg | ORAL_TABLET | Freq: Once | ORAL | Status: AC

## 2019-07-14 MED ORDER — HEPARIN SODIUM (PORCINE) 1000 UNIT/ML IJ SOLN
INTRAMUSCULAR | Status: AC
  Filled 2019-07-14: qty 1

## 2019-07-14 MED ORDER — HYDROMORPHONE HCL 1 MG/ML IJ SOLN: 1.0000 mg | Freq: Once | INTRAMUSCULAR | Status: DC | PRN

## 2019-07-14 MED ORDER — IODIXANOL 320 MG/ML IV SOLN
INTRAVENOUS | Status: DC | PRN
  Administered 2019-07-14: 40 mL via INTRAVENOUS

## 2019-07-14 SURGICAL SUPPLY — 17 items
BALLN DORADO 8X40X80 (BALLOONS) ×2
BALLN ULTRVRSE 4X40X75C (BALLOONS) ×2
BALLOON DORADO 8X40X80 (BALLOONS) IMPLANT
BALLOON ULTRVRSE 4X40X75C (BALLOONS) IMPLANT
DEVICE PRESTO INFLATION (MISCELLANEOUS) ×1 IMPLANT
DEVICE TORQUE .025-.038 (MISCELLANEOUS) ×1 IMPLANT
GUIDEWIRE ANGLED .035 180CM (WIRE) ×1 IMPLANT
NDL ENTRY 21GA 7CM ECHOTIP (NEEDLE) IMPLANT
NEEDLE ENTRY 21GA 7CM ECHOTIP (NEEDLE) ×2 IMPLANT
PACK ANGIOGRAPHY (CUSTOM PROCEDURE TRAY) ×2 IMPLANT
SET INTRO CAPELLA COAXIAL (SET/KITS/TRAYS/PACK) ×1 IMPLANT
SHEATH BRITE TIP 6FRX5.5 (SHEATH) ×1 IMPLANT
SHEATH BRITE TIP 7FRX5.5 (SHEATH) ×1 IMPLANT
STENT VIABAHN 8X2.5X120 (Permanent Stent) IMPLANT
STENT VIABAHN 8X25X120 (Permanent Stent) ×2 IMPLANT
SUT MNCRL AB 4-0 PS2 18 (SUTURE) ×1 IMPLANT
WIRE G V18X300CM (WIRE) ×1 IMPLANT

## 2019-07-14 NOTE — Op Note (Signed)
OPERATIVE NOTE   PROCEDURE: 1. Contrast injection left brachial axillary AV access 2. Percutaneous transluminal angioplasty and stent placement midportion left brachial axillary AV graft  PRE-OPERATIVE DIAGNOSIS: Complication of dialysis access                                                       End Stage Renal Disease  POST-OPERATIVE DIAGNOSIS: same as above   SURGEON: Katha Cabal, M.D.  ANESTHESIA: Conscious sedation was administered under my direct supervision by the interventional radiology RN. IV Versed plus fentanyl were utilized. Continuous ECG, pulse oximetry and blood pressure was monitored throughout the entire procedure.  Conscious sedation was for a total of 40 minutes.  ESTIMATED BLOOD LOSS: minimal  FINDING(S): 1. String sign in the midportion of the graft with a greater than 70% stenosis at the arterial anastomosis  SPECIMEN(S):  None  CONTRAST: 40 cc  FLUOROSCOPY TIME: 2.9 minutes  INDICATIONS: Carrie [name] is a 69 y.o. female who  presents with malfunctioning left AV access.  The patient is scheduled for angiography with possible intervention of the AV access.  The patient is aware the risks include but are not limited to: bleeding, infection, thrombosis of the cannulated access, and possible anaphylactic reaction to the contrast.  The patient acknowledges if the access can not be salvaged a tunneled catheter will be needed and will be placed during this procedure.  The patient is aware of the risks of the procedure and elects to proceed with the angiogram and intervention.  DESCRIPTION: After full informed written consent was obtained, the patient was brought back to the Special Procedure suite and placed supine position.  Appropriate cardiopulmonary monitors were placed.  The left arm was prepped and draped in the standard fashion.  Appropriate timeout is called. The left brachial axillary AV graft was cannulated with a micropuncture needle.  Cannulation  was performed with ultrasound guidance. Ultrasound was placed in a sterile sleeve, the AV access was interrogated and noted to be echolucent and compressible indicating patency. Image was recorded for the permanent record. The puncture is performed under continuous ultrasound visualization.   The microwire was advanced and the needle was exchanged for  a microsheath.  The J-wire was then advanced and a 6 Fr sheath inserted.  Hand injections were completed to image the access from the arterial anastomosis through the entire access.  The central venous structures were also imaged by hand injections.  Based on the images,  3000 units of heparin was given and a wire was negotiated through the strictures within the venous portion of the graft.  An 8 mm x 1525 mm Viabahn was deployed across the stenoses and postdilated with an 8 mm Dorado balloon inflated to 20 atm for 30 seconds.  The 7 French sheath was then flipped into the retrograde direction and a floppy Glidewire extended out into the brachial artery.  A 4 mm x 40 mm balloon was then used to angioplasty the arterial anastomosis.  Inflation was to 10 atm for approximately 1 minute.  Follow-up imaging demonstrated wide patency with a 4 mm lumen at the level of the arterial anastomosis.  Follow-up imaging demonstrates complete resolution of the stricture with rapid flow of contrast through the graft, the central venous anatomy is preserved.  A 4-0 Monocryl purse-string suture was sewn around  the sheath.  The sheath was removed and light pressure was applied.  A sterile bandage was applied to the puncture site.  Diagnostic interpretation: There is a string sign in the mid graft just before the leading edge of the previously placed Viabahn stents.  Previously placed Viabahn stents are widely patent and fully expanded.  They extend into the axillary vein.  Axillary vein subclavian vein innominate and superior vena cava are widely patent.  There is a greater  than 70% stenosis at the arterial anastomosis.  Following placement of a 8 mm Viabahn postdilated to 8 mm with a Dorado balloon there is now less than 5% residual stenosis in the midportion of the graft.  Following angioplasty of the arterial anastomosis to 4 mm there is now less than 5% residual stenosis.  Successful salvage of left arm AV graft  COMPLICATIONS: None  CONDITION: Carrie [name], M.D [facility] Office: [phone]  07/14/2019 12:28 PM

## 2019-07-14 NOTE — Discharge Instructions (Signed)
Dialysis Fistulogram, Care After This sheet gives you information about how to care for yourself after your procedure. Your health care provider may also give you more specific instructions. If you have problems or questions, contact your health care provider. What can I expect after the procedure? After the procedure, it is common to have:  A small amount of discomfort in the area where the small, thin tube (catheter) was placed for the procedure.  A small amount of bruising around the fistula.  Sleepiness and tiredness (fatigue). Follow these instructions at home: Activity   Rest at home and do not lift anything that is heavier than 5 lb (2.3 kg) on the day after your procedure.  Return to your normal activities as told by your health care provider. Ask your health care provider what activities are safe for you.  Do not drive or use heavy machinery while taking prescription pain medicine.  Do not drive for 24 hours if you were given a medicine to help you relax (sedative) during your procedure. Medicines   Take over-the-counter and prescription medicines only as told by your health care provider. Puncture site care  Follow instructions from your health care provider about how to take care of the site where catheters were inserted. Make sure you: ? Wash your hands with soap and water before you change your bandage (dressing). If soap and water are not available, use hand sanitizer. ? Change your dressing as told by your health care provider. ? Leave stitches (sutures), skin glue, or adhesive strips in place. These skin closures may need to stay in place for 2 weeks or longer. If adhesive strip edges start to loosen and curl up, you may trim the loose edges. Do not remove adhesive strips completely unless your health care provider tells you to do that.  Check your puncture area every day for signs of infection. Check for: ? Redness, swelling, or pain. ? Fluid or  blood. ? Warmth. ? Pus or a bad smell. General instructions  Do not take baths, swim, or use a hot tub until your health care provider approves. Ask your health care provider if you may take showers. You may only be allowed to take sponge baths.  Monitor your dialysis fistula closely. Check to make sure that you can feel a vibration or buzz (a thrill) when you put your fingers over the fistula.  Prevent damage to your graft or fistula: ? Do not wear tight-fitting clothing or jewelry on the arm or leg that has your graft or fistula. ? Tell all your health care providers that you have a dialysis fistula or graft. ? Do not allow blood draws, IVs, or blood pressure readings to be done in the arm that has your fistula or graft. ? Do not allow flu shots or vaccinations in the arm with your fistula or graft.  Keep all follow-up visits as told by your health care provider. This is important. Contact a health care provider if:  You have redness, swelling, or pain at the site where the catheter was put in.  You have fluid or blood coming from the catheter site.  The catheter site feels warm to the touch.  You have pus or a bad smell coming from the catheter site.  You have a fever or chills. Get help right away if:  You feel weak.  You have trouble balancing.  You have trouble moving your arms or legs.  You have problems with your speech or vision.  You   can no longer feel a vibration or buzz when you put your fingers over your dialysis fistula.  The limb that was used for the procedure: ? Swells. ? Is painful. ? Is cold. ? Is discolored, such as blue or pale white.  You have chest pain or shortness of breath. Summary  After a dialysis fistulogram, it is common to have a small amount of discomfort or bruising in the area where the small, thin tube (catheter) was placed.  Rest at home on the day after your procedure. Return to your normal activities as told by your health care  provider.  Take over-the-counter and prescription medicines only as told by your health care provider.  Follow instructions from your health care provider about how to take care of the site where the catheter was inserted.  Keep all follow-up visits as told by your health care provider. This information is not intended to replace advice given to you by your health care provider. Make sure you discuss any questions you have with your health care provider. Document Revised: 06/21/2017 Document Reviewed: 06/21/2017 Elsevier Patient Education  [place].   Moderate Conscious Sedation, Adult, Care After These instructions provide you with information about caring for yourself after your procedure. Your health care provider may also give you more specific instructions. Your treatment has been planned according to current medical practices, but problems sometimes occur. Call your health care provider if you have any problems or questions after your procedure. What can I expect after the procedure? After your procedure, it is common:  To feel sleepy for several hours.  To feel clumsy and have poor balance for several hours.  To have poor judgment for several hours.  To vomit if you eat too soon. Follow these instructions at home: For at least 24 hours after the procedure:   Do not: ? Participate in activities where you could fall or become injured. ? Drive. ? Use heavy machinery. ? Drink alcohol. ? Take sleeping pills or medicines that cause drowsiness. ? Make important decisions or sign legal documents. ? Take care of children on your own.  Rest. Eating and drinking  Follow the diet recommended by your health care provider.  If you vomit: ? Drink water, juice, or soup when you can drink without vomiting. ? Make sure you have little or no nausea before eating solid foods. General instructions  Have a responsible adult stay with you until you are awake and  alert.  Take over-the-counter and prescription medicines only as told by your health care provider.  If you smoke, do not smoke without supervision.  Keep all follow-up visits as told by your health care provider. This is important. Contact a health care provider if:  You keep feeling nauseous or you keep vomiting.  You feel light-headed.  You develop a rash.  You have a fever. Get help right away if:  You have trouble breathing. This information is not intended to replace advice given to you by your health care provider. Make sure you discuss any questions you have with your health care provider. Document Revised: 05/03/2017 Document Reviewed: 09/10/2015 Elsevier Patient Education  2020 [employer].

## 2019-07-14 NOTE — H&P (Signed)
[facility] History & Physical Update  The patient was interviewed and re-examined.  The patient's previous History and Physical has been reviewed and is unchanged.  There is no change in the plan of care. We plan to proceed with the scheduled procedure.  [name], MD  07/14/2019, 11:51 AM

## 2019-07-15 ENCOUNTER — Encounter: Payer: Self-pay | Admitting: Cardiology

## 2019-07-15 DIAGNOSIS — Z992 Dependence on renal dialysis: Secondary | ICD-10-CM | POA: Diagnosis not present

## 2019-07-15 DIAGNOSIS — N186 End stage renal disease: Secondary | ICD-10-CM | POA: Diagnosis not present

## 2019-07-17 DIAGNOSIS — N186 End stage renal disease: Secondary | ICD-10-CM | POA: Diagnosis not present

## 2019-07-17 DIAGNOSIS — Z992 Dependence on renal dialysis: Secondary | ICD-10-CM | POA: Diagnosis not present

## 2019-07-20 DIAGNOSIS — Z992 Dependence on renal dialysis: Secondary | ICD-10-CM | POA: Diagnosis not present

## 2019-07-20 DIAGNOSIS — N186 End stage renal disease: Secondary | ICD-10-CM | POA: Diagnosis not present

## 2019-07-22 DIAGNOSIS — Z992 Dependence on renal dialysis: Secondary | ICD-10-CM | POA: Diagnosis not present

## 2019-07-22 DIAGNOSIS — N186 End stage renal disease: Secondary | ICD-10-CM | POA: Diagnosis not present

## 2019-07-24 DIAGNOSIS — N186 End stage renal disease: Secondary | ICD-10-CM | POA: Diagnosis not present

## 2019-07-24 DIAGNOSIS — Z992 Dependence on renal dialysis: Secondary | ICD-10-CM | POA: Diagnosis not present

## 2019-07-27 DIAGNOSIS — N186 End stage renal disease: Secondary | ICD-10-CM | POA: Diagnosis not present

## 2019-07-27 DIAGNOSIS — Z992 Dependence on renal dialysis: Secondary | ICD-10-CM | POA: Diagnosis not present

## 2019-07-29 ENCOUNTER — Other Ambulatory Visit (INDEPENDENT_AMBULATORY_CARE_PROVIDER_SITE_OTHER): Payer: Self-pay | Admitting: Vascular Surgery

## 2019-07-29 DIAGNOSIS — N186 End stage renal disease: Secondary | ICD-10-CM | POA: Diagnosis not present

## 2019-07-29 DIAGNOSIS — T829XXS Unspecified complication of cardiac and vascular prosthetic device, implant and graft, sequela: Secondary | ICD-10-CM

## 2019-07-29 DIAGNOSIS — Z992 Dependence on renal dialysis: Secondary | ICD-10-CM | POA: Diagnosis not present

## 2019-07-29 DIAGNOSIS — Z95820 Peripheral vascular angioplasty status with implants and grafts: Secondary | ICD-10-CM

## 2019-07-30 ENCOUNTER — Encounter (INDEPENDENT_AMBULATORY_CARE_PROVIDER_SITE_OTHER): Payer: Self-pay | Admitting: Nurse Practitioner

## 2019-07-30 ENCOUNTER — Other Ambulatory Visit: Payer: Self-pay

## 2019-07-30 ENCOUNTER — Ambulatory Visit (INDEPENDENT_AMBULATORY_CARE_PROVIDER_SITE_OTHER): Payer: Medicare HMO

## 2019-07-30 ENCOUNTER — Ambulatory Visit (INDEPENDENT_AMBULATORY_CARE_PROVIDER_SITE_OTHER): Payer: Medicare HMO | Admitting: Nurse Practitioner

## 2019-07-30 VITALS — BP 111/65 | HR 73 | Resp 10 | Ht 63.0 in | Wt 242.0 lb

## 2019-07-30 DIAGNOSIS — Z95820 Peripheral vascular angioplasty status with implants and grafts: Secondary | ICD-10-CM

## 2019-07-30 DIAGNOSIS — N186 End stage renal disease: Secondary | ICD-10-CM | POA: Diagnosis not present

## 2019-07-30 DIAGNOSIS — E1121 Type 2 diabetes mellitus with diabetic nephropathy: Secondary | ICD-10-CM

## 2019-07-30 DIAGNOSIS — I10 Essential (primary) hypertension: Secondary | ICD-10-CM | POA: Diagnosis not present

## 2019-07-30 DIAGNOSIS — Z992 Dependence on renal dialysis: Secondary | ICD-10-CM

## 2019-07-30 DIAGNOSIS — T829XXS Unspecified complication of cardiac and vascular prosthetic device, implant and graft, sequela: Secondary | ICD-10-CM | POA: Diagnosis not present

## 2019-07-31 DIAGNOSIS — N186 End stage renal disease: Secondary | ICD-10-CM | POA: Diagnosis not present

## 2019-07-31 DIAGNOSIS — Z992 Dependence on renal dialysis: Secondary | ICD-10-CM | POA: Diagnosis not present

## 2019-08-02 DIAGNOSIS — N186 End stage renal disease: Secondary | ICD-10-CM | POA: Diagnosis not present

## 2019-08-02 DIAGNOSIS — Z992 Dependence on renal dialysis: Secondary | ICD-10-CM | POA: Diagnosis not present

## 2019-08-03 DIAGNOSIS — Z992 Dependence on renal dialysis: Secondary | ICD-10-CM | POA: Diagnosis not present

## 2019-08-03 DIAGNOSIS — N186 End stage renal disease: Secondary | ICD-10-CM | POA: Diagnosis not present

## 2019-08-04 ENCOUNTER — Encounter (INDEPENDENT_AMBULATORY_CARE_PROVIDER_SITE_OTHER): Payer: Self-pay | Admitting: Nurse Practitioner

## 2019-08-04 NOTE — Progress Notes (Signed)
SUBJECTIVE:  Patient ID: Carrie [name], female    DOB: [birth year], 69 y.o.   MRN: [id number] Chief Complaint  Patient presents with  . Follow-up    U/S Follow up    HPI  Carrie [name] is a 69 y.o. female The patient returns to the office for followup status post intervention of the dialysis access left brachial axillary graft. Following the intervention the access function has significantly improved, with better flow rates and improved KT/V. The patient has not been experiencing increased bleeding times following decannulation and the patient denies increased recirculation. The patient denies an increase in arm swelling. At the present time the patient denies hand pain.  The patient denies amaurosis fugax or recent TIA symptoms. There are no recent neurological changes noted. The patient denies claudication symptoms or rest pain symptoms. The patient denies history of DVT, PE or superficial thrombophlebitis. The patient denies recent episodes of angina or shortness of breath.   Today the patient has a flow volume of 1309.  The AV graft is patent throughout.     Past Medical History:  Diagnosis Date  . Allergy   . Anemia   . Arthritis    "right knee" (10/26'/2017)  . CKD (chronic kidney disease), stage III    12/2015 (Dr. Lavonia Dana)  . Depression   . History of blood transfusion 2008   "when I had brain tumor surgery"  . History of MRSA infection 2008  . Hypertension   . 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 Type II diabetes mellitus ([place]) 2011   on metformin until yr ago- no med now - off due to kidneys    Past Surgical History:  Procedure Laterality Date  . A/V FISTULAGRAM Left 03/10/2019   Procedure: A/V FISTULAGRAM;  Surgeon: Katha Cabal, MD;  Location: [place] [facility];  Service: Cardiovascular;  Laterality: Left;  . A/V FISTULAGRAM Left 07/14/2019   Procedure: A/V FISTULAGRAM;  Surgeon: Katha Cabal, MD;  Location: [place] [facility];   Service: Cardiovascular;  Laterality: Left;  . AV FISTULA PLACEMENT Left 11/07/2018   Procedure: ARTERIOVENOUS (AV) FISTULA CREATION ( BRACHIO BASILIC ) VS BRACHIAL AXILLARY GRAFT;  Surgeon: Katha Cabal, MD;  Location: [facility];  Service: Vascular;  Laterality: Left;  . BRAIN MENINGIOMA EXCISION  08/2006   Foramina magnum meningioma  . CERVICAL DISCECTOMY  1996   Dr. Alric Seton  . CESAREAN SECTION  1981  . COLONOSCOPY    . DIALYSIS/PERMA CATHETER INSERTION N/A 08/04/2018   Procedure: DIALYSIS/PERMA CATHETER INSERTION;  Surgeon: Algernon Huxley, MD;  Location: [place] [facility];  Service: Cardiovascular;  Laterality: N/A;  . DIALYSIS/PERMA CATHETER REMOVAL N/A 12/25/2018   Procedure: DIALYSIS/PERMA CATHETER REMOVAL;  Surgeon: Algernon Huxley, MD;  Location: [place] [facility];  Service: Cardiovascular;  Laterality: N/A;  . DILATION AND CURETTAGE OF UTERUS    . JOINT REPLACEMENT     right knee left hip  . Right wrist x-ray  2007   Deg. at 1st MCP  . TOTAL HIP ARTHROPLASTY Left 03/27/2016   Procedure: LEFT TOTAL HIP ARTHROPLASTY ANTERIOR APPROACH;  Surgeon: Mcarthur Rossetti, MD;  Location: [place];  Service: Orthopedics;  Laterality: Left;  . TOTAL KNEE ARTHROPLASTY Right 04/30/2017   Procedure: RIGHT TOTAL KNEE ARTHROPLASTY;  Surgeon: Mcarthur Rossetti, MD;  Location: [place];  Service: Orthopedics;  Laterality: Right;  . TUBAL LIGATION  1981    Social History   Socioeconomic History  .  Marital status: Widowed    Spouse name: Not on file  . Number of children: 1  . Years of education: Not on file  . Highest education level: Not on file  Occupational History  . 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 Occupation: Disabled since brain surgery  Tobacco Use  . Smoking status: Never Smoker  . Smokeless tobacco: Never Used  Substance and Sexual Activity  . Alcohol use: No    Alcohol/week: 0.0 standard drinks  . Drug use: No  . Sexual activity: Never  Other Topics  Concern  . Not on file  Social History Narrative   Lives in family home with extended family.      No living will   Requests niece, [name], as health care POA   Would accept resuscitation attempts   No tube feeds if cognitively aware   Social Determinants of Health   Financial Resource Strain:   . Difficulty of Paying Living Expenses: Not on file  Food Insecurity:   . Worried About Charity fundraiser in the Last Year: Not on file  . Ran Out of Food in the Last Year: Not on file  Transportation Needs:   . Lack of Transportation (Medical): Not on file  . Lack of Transportation (Non-Medical): Not on file  Physical Activity:   . Days of Exercise per Week: Not on file  . Minutes of Exercise per Session: Not on file  Stress:   . Feeling of Stress : Not on file  Social Connections:   . Frequency of Communication with Friends and Family: Not on file  . Frequency of Social Gatherings with Friends and Family: Not on file  . Attends Religious Services: Not on file  . Active Member of Clubs or Organizations: Not on file  . Attends Archivist Meetings: Not on file  . Marital Status: Not on file  Intimate Partner Violence:   . Fear of Current or Ex-Partner: Not on file  . Emotionally Abused: Not on file  . Physically Abused: Not on file  . Sexually Abused: Not on file    Family History  Problem Relation Age of Onset  . Hypertension Other        Strong family history   . Breast cancer Mother 19  . Heart failure Sister   . Heart disease Sister     Allergies  Allergen Reactions  . Naproxen Sodium Anaphylaxis and Swelling  . Sulfamethoxazole-Trimethoprim Anaphylaxis and Swelling     Review of Systems   Review of Systems: Negative Unless Checked Constitutional: [] Weight loss  [] Fever  [] Chills Cardiac: [] Chest pain   []  Atrial Fibrillation  [] Palpitations   [] Shortness of breath when laying flat   [] Shortness of breath with exertion. [] Shortness of breath at  rest Vascular:  [] Pain in legs with walking   [] Pain in legs with standing [] Pain in legs when laying flat   [] Claudication    [] Pain in feet when laying flat    [] History of DVT   [] Phlebitis   [] Swelling in legs   [] Varicose veins   [] Non-healing ulcers Pulmonary:   [] Uses home oxygen   [] Productive cough   [] Hemoptysis   [] Wheeze  [] COPD   [] Asthma Neurologic:  [] Dizziness   [] Seizures  [] Blackouts [] History of stroke   [] History of TIA  [] Aphasia   [] Temporary Blindness   [] Weakness or numbness in arm   [] Weakness or numbness in leg Musculoskeletal:   [] Joint swelling   [] Joint pain   []   Low back pain  []  History of Knee Replacement [x] Arthritis [] back Surgeries  []  Spinal Stenosis    Hematologic:  [] Easy bruising  [] Easy bleeding   [] Hypercoagulable state   [x] Anemic Gastrointestinal:  [] Diarrhea   [] Vomiting  [] Gastroesophageal reflux/heartburn   [] Difficulty swallowing. [] Abdominal pain Genitourinary:  [x] Chronic kidney disease   [] Difficult urination  [] Anuric   [] Blood in urine [] Frequent urination  [] Burning with urination   [] Hematuria Skin:  [] Rashes   [] Ulcers [] Wounds Psychological:  [] History of anxiety   []  History of major depression  []  Memory Difficulties      OBJECTIVE:   Physical Exam  BP 111/65   Pulse 73   Resp 10   Ht 5\' 3"  (1.6 m)   Wt 242 lb (109.8 kg)   BMI 42.87 kg/m   Gen: WD/WN, NAD Head: [place]/AT, No temporalis wasting.  Ear/Nose/Throat: Hearing grossly intact, nares w/o erythema or drainage Eyes: PER, EOMI, sclera nonicteric.  Neck: Supple, no masses.  No JVD.  Pulmonary:  Good air movement, no use of accessory muscles.  Cardiac: RRR Vascular:  Good thrill and bruit Vessel Right Left  Radial Palpable Palpable   Gastrointestinal: soft, non-distended. No guarding/no peritoneal signs.  Musculoskeletal: M/S 5/5 throughout.  No deformity or atrophy.  Neurologic: Pain and light touch intact in extremities.  Symmetrical.  Speech is fluent. Motor exam as  listed above. Psychiatric: Judgment intact, Mood & affect appropriate for pt's clinical situation.        ASSESSMENT AND PLAN:  1. ESRD (end stage renal disease) ([place]) Recommend:  The patient is doing well and currently has adequate dialysis access. The patient's dialysis center is not reporting any access issues. Flow pattern is stable when compared to the prior ultrasound.  The patient should have a duplex ultrasound of the dialysis access in 6 months. The patient will follow-up with me in the office after each ultrasound     2. Controlled type 2 diabetes mellitus with diabetic nephropathy, without long-term current use of insulin ([place]) Patient on appropriate medications.  Well-controlled.  No changes needed today.  3. Essential hypertension, benign Good blood pressure control today.  Patient on appropriate medications.  No changes.   Current Outpatient Medications on File Prior to Visit  Medication Sig Dispense Refill  . acetaminophen (TYLENOL) 500 MG tablet Take 500-1,000 mg by mouth every 6 (six) hours as needed (for pain.).    [name] aspirin EC 81 MG tablet Take 81 mg by mouth daily.    [name] atorvastatin (LIPITOR) 40 MG tablet TAKE 40 MG BY MOUTH ONCE DAILY (Patient taking differently: Take 40 mg by mouth daily. Take 40 mg by mouth once daily) 90 tablet 3  . levETIRAcetam (KEPPRA) 1000 MG tablet TAKE 1 TABLET BY MOUTH TWICE A DAY (Patient taking differently: Take 1,000 mg by mouth 2 (two) times daily. ) 180 tablet 3  . lidocaine-prilocaine (EMLA) cream Apply 1 application topically every Monday, Wednesday, and Friday with hemodialysis.    [name] LORazepam (ATIVAN) 0.5 MG tablet TAKE 1 TABLET (0.5 MG TOTAL) BY MOUTH 2 (TWO) TIMES DAILY AS NEEDED. (Patient taking differently: Take 0.5 mg by mouth 2 (two) times daily as needed for anxiety. ) 60 tablet 0  . losartan (COZAAR) 50 MG tablet Take 50 mg by mouth daily.    . metoprolol tartrate (LOPRESSOR) 25 MG tablet Take 25 mg by mouth every  evening.     . multivitamin (RENA-VIT) TABS tablet Take 1 tablet by mouth at bedtime. 30 tablet 0  .  sevelamer carbonate (RENVELA) 800 MG tablet Take 800-1,600 mg by mouth See admin instructions. Take 2 tablets (1600 mg) by mouth with each meal & 1 tablet (800 mg) by mouth with each snack    . torsemide (DEMADEX) 20 MG tablet Take 20 mg by mouth 4 (four) times a week. Take 1 tablet (20 mg) by mouth on Tuesdays, Thursdays, Saturdays, & Sundays in the morning on non-dialysis days.     No current facility-administered medications on file prior to visit.    There are no Patient Instructions on file for this visit. No follow-ups on file.   Kris Hartmann, NP  This note was completed with Sales executive.  Any errors are purely unintentional.

## 2019-08-05 DIAGNOSIS — Z992 Dependence on renal dialysis: Secondary | ICD-10-CM | POA: Diagnosis not present

## 2019-08-05 DIAGNOSIS — N186 End stage renal disease: Secondary | ICD-10-CM | POA: Diagnosis not present

## 2019-08-07 DIAGNOSIS — I5032 Chronic diastolic (congestive) heart failure: Secondary | ICD-10-CM | POA: Diagnosis not present

## 2019-08-07 DIAGNOSIS — Z992 Dependence on renal dialysis: Secondary | ICD-10-CM | POA: Diagnosis not present

## 2019-08-07 DIAGNOSIS — N186 End stage renal disease: Secondary | ICD-10-CM | POA: Diagnosis not present

## 2019-08-10 DIAGNOSIS — Z992 Dependence on renal dialysis: Secondary | ICD-10-CM | POA: Diagnosis not present

## 2019-08-10 DIAGNOSIS — N186 End stage renal disease: Secondary | ICD-10-CM | POA: Diagnosis not present

## 2019-08-12 DIAGNOSIS — N186 End stage renal disease: Secondary | ICD-10-CM | POA: Diagnosis not present

## 2019-08-12 DIAGNOSIS — Z992 Dependence on renal dialysis: Secondary | ICD-10-CM | POA: Diagnosis not present

## 2019-08-14 DIAGNOSIS — N186 End stage renal disease: Secondary | ICD-10-CM | POA: Diagnosis not present

## 2019-08-14 DIAGNOSIS — Z992 Dependence on renal dialysis: Secondary | ICD-10-CM | POA: Diagnosis not present

## 2019-08-17 DIAGNOSIS — N186 End stage renal disease: Secondary | ICD-10-CM | POA: Diagnosis not present

## 2019-08-17 DIAGNOSIS — Z992 Dependence on renal dialysis: Secondary | ICD-10-CM | POA: Diagnosis not present

## 2019-08-19 DIAGNOSIS — Z992 Dependence on renal dialysis: Secondary | ICD-10-CM | POA: Diagnosis not present

## 2019-08-19 DIAGNOSIS — N186 End stage renal disease: Secondary | ICD-10-CM | POA: Diagnosis not present

## 2019-08-21 DIAGNOSIS — Z992 Dependence on renal dialysis: Secondary | ICD-10-CM | POA: Diagnosis not present

## 2019-08-21 DIAGNOSIS — N186 End stage renal disease: Secondary | ICD-10-CM | POA: Diagnosis not present

## 2019-08-24 ENCOUNTER — Other Ambulatory Visit (INDEPENDENT_AMBULATORY_CARE_PROVIDER_SITE_OTHER): Payer: Self-pay | Admitting: Nurse Practitioner

## 2019-08-24 DIAGNOSIS — M7989 Other specified soft tissue disorders: Secondary | ICD-10-CM

## 2019-08-24 DIAGNOSIS — N186 End stage renal disease: Secondary | ICD-10-CM

## 2019-08-24 DIAGNOSIS — Z992 Dependence on renal dialysis: Secondary | ICD-10-CM | POA: Diagnosis not present

## 2019-08-25 ENCOUNTER — Other Ambulatory Visit: Payer: Self-pay

## 2019-08-25 ENCOUNTER — Ambulatory Visit (INDEPENDENT_AMBULATORY_CARE_PROVIDER_SITE_OTHER): Payer: Medicare HMO

## 2019-08-25 ENCOUNTER — Ambulatory Visit (INDEPENDENT_AMBULATORY_CARE_PROVIDER_SITE_OTHER): Payer: Medicare HMO | Admitting: Nurse Practitioner

## 2019-08-25 ENCOUNTER — Encounter (INDEPENDENT_AMBULATORY_CARE_PROVIDER_SITE_OTHER): Payer: Self-pay | Admitting: Nurse Practitioner

## 2019-08-25 VITALS — BP 129/73 | HR 73 | Resp 16 | Wt 246.6 lb

## 2019-08-25 DIAGNOSIS — M7989 Other specified soft tissue disorders: Secondary | ICD-10-CM | POA: Diagnosis not present

## 2019-08-25 DIAGNOSIS — N186 End stage renal disease: Secondary | ICD-10-CM

## 2019-08-25 DIAGNOSIS — I10 Essential (primary) hypertension: Secondary | ICD-10-CM | POA: Diagnosis not present

## 2019-08-25 DIAGNOSIS — E7800 Pure hypercholesterolemia, unspecified: Secondary | ICD-10-CM | POA: Diagnosis not present

## 2019-08-26 ENCOUNTER — Other Ambulatory Visit: Payer: Self-pay | Admitting: Internal Medicine

## 2019-08-26 DIAGNOSIS — N186 End stage renal disease: Secondary | ICD-10-CM | POA: Diagnosis not present

## 2019-08-26 DIAGNOSIS — Z992 Dependence on renal dialysis: Secondary | ICD-10-CM | POA: Diagnosis not present

## 2019-08-27 NOTE — Telephone Encounter (Signed)
Last filled 07-07-19 #60 Last OV 01-27-19 Next OV 02-02-20 [facility]

## 2019-08-28 DIAGNOSIS — N186 End stage renal disease: Secondary | ICD-10-CM | POA: Diagnosis not present

## 2019-08-28 DIAGNOSIS — Z992 Dependence on renal dialysis: Secondary | ICD-10-CM | POA: Diagnosis not present

## 2019-08-30 ENCOUNTER — Encounter (INDEPENDENT_AMBULATORY_CARE_PROVIDER_SITE_OTHER): Payer: Self-pay | Admitting: Nurse Practitioner

## 2019-08-30 NOTE — Progress Notes (Signed)
Subjective:    Patient ID: Carrie [name], female    DOB: 25-Aug-1950, 69 y.o.   MRN: [id number] Chief Complaint  Patient presents with  . Follow-up    left hand swelling    Today the patient is a 69 year old female with previous history of end-stage renal disease with a left brachial axillary graft.  The patient presents today due to concern from her dialysis center of left upper extremity hand swelling.  The patient states that she had not noticed any swelling but the dialysis nurse noticed it remotely.  The patient has had a previous history of any intervention to her left brachial axillary graft.  The swelling is in her left hand between her thumb and first finger.  It is light.  The patient denies any subclavian steal-like symptoms.  She denies any issues with dialysis access.  She denies any issues with bleeding or issues with one time.  Today noninvasive studies reveal a flow volume of 1254.  The AV graft is patent throughout.  There appears to be no evidence of steal.  No significant change from previous HDA that was done on 07/30/2019   Review of Systems  All other systems reviewed and are negative.      Objective:   Physical Exam Vitals reviewed.  Constitutional:      Appearance: Normal appearance. She is obese.  HENT:     Head: Normocephalic.  Cardiovascular:     Rate and Rhythm: Normal rate and regular rhythm.     Pulses:          Radial pulses are 1+ on the right side and 1+ on the left side.     Arteriovenous access: left arteriovenous access is present.    Comments: Good thrill and bruit.  Very minimal edema left hand. Musculoskeletal:        General: Normal range of motion.  Neurological:     General: No focal deficit present.     Mental Status: She is alert and oriented to person, place, and time.  Psychiatric:        Mood and Affect: Mood normal.        Behavior: Behavior normal.        Thought Content: Thought content normal.        Judgment: Judgment  normal.     BP 129/73 (BP Location: Right Arm)   Pulse 73   Resp 16   Wt 246 lb 9.6 oz (111.9 kg)   BMI 43.68 kg/m   Past Medical History:  Diagnosis Date  . Allergy   . Anemia   . Arthritis    "right knee" (10/26'/2017)  . CKD (chronic kidney disease), stage III    12/2015 (Dr. Lavonia Dana)  . Depression   . History of blood transfusion 2008   "when I had brain tumor surgery"  . History of MRSA infection 2008  . Hypertension   . 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 Type II diabetes mellitus ([place]) 2011   on metformin until yr ago- no med now - off due to kidneys    Social History   Socioeconomic History  . Marital status: Widowed    Spouse name: Not on file  . Number of children: 1  . Years of education: Not on file  . Highest education level: Not on file  Occupational History  . 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 Occupation: Disabled since brain surgery  Tobacco Use  . Smoking status:  Never Smoker  . Smokeless tobacco: Never Used  Substance and Sexual Activity  . Alcohol use: No    Alcohol/week: 0.0 standard drinks  . Drug use: No  . Sexual activity: Never  Other Topics Concern  . Not on file  Social History Narrative   Lives in family home with extended family.      No living will   Requests niece, [name], as health care POA   Would accept resuscitation attempts   No tube feeds if cognitively aware   Social Determinants of Health   Financial Resource Strain:   . Difficulty of Paying Living Expenses:   Food Insecurity:   . Worried About Charity fundraiser in the Last Year:   . Arboriculturist in the Last Year:   Transportation Needs:   . Film/video editor (Medical):   [name] Lack of Transportation (Non-Medical):   Physical Activity:   . Days of Exercise per Week:   . Minutes of Exercise per Session:   Stress:   . Feeling of Stress :   Social Connections:   . Frequency of Communication with Friends and Family:   .  Frequency of Social Gatherings with Friends and Family:   . Attends Religious Services:   . Active Member of Clubs or Organizations:   . Attends Archivist Meetings:   [name] Marital Status:   Intimate Partner Violence:   . Fear of Current or Ex-Partner:   . Emotionally Abused:   [name] Physically Abused:   . Sexually Abused:     Past Surgical History:  Procedure Laterality Date  . A/V FISTULAGRAM Left 03/10/2019   Procedure: A/V FISTULAGRAM;  Surgeon: Katha Cabal, MD;  Location: [place] [facility];  Service: Cardiovascular;  Laterality: Left;  . A/V FISTULAGRAM Left 07/14/2019   Procedure: A/V FISTULAGRAM;  Surgeon: Katha Cabal, MD;  Location: [place] [facility];  Service: Cardiovascular;  Laterality: Left;  . AV FISTULA PLACEMENT Left 11/07/2018   Procedure: ARTERIOVENOUS (AV) FISTULA CREATION ( BRACHIO BASILIC ) VS BRACHIAL AXILLARY GRAFT;  Surgeon: Katha Cabal, MD;  Location: [facility];  Service: Vascular;  Laterality: Left;  . BRAIN MENINGIOMA EXCISION  08/2006   Foramina magnum meningioma  . CERVICAL DISCECTOMY  1996   Dr. Alric Seton  . CESAREAN SECTION  1981  . COLONOSCOPY    . DIALYSIS/PERMA CATHETER INSERTION N/A 08/04/2018   Procedure: DIALYSIS/PERMA CATHETER INSERTION;  Surgeon: Algernon Huxley, MD;  Location: [place] [facility];  Service: Cardiovascular;  Laterality: N/A;  . DIALYSIS/PERMA CATHETER REMOVAL N/A 12/25/2018   Procedure: DIALYSIS/PERMA CATHETER REMOVAL;  Surgeon: Algernon Huxley, MD;  Location: [place] [facility];  Service: Cardiovascular;  Laterality: N/A;  . DILATION AND CURETTAGE OF UTERUS    . JOINT REPLACEMENT     right knee left hip  . Right wrist x-ray  2007   Deg. at 1st MCP  . TOTAL HIP ARTHROPLASTY Left 03/27/2016   Procedure: LEFT TOTAL HIP ARTHROPLASTY ANTERIOR APPROACH;  Surgeon: Mcarthur Rossetti, MD;  Location: [place];  Service: Orthopedics;  Laterality: Left;  . TOTAL KNEE ARTHROPLASTY Right 04/30/2017   Procedure: RIGHT  TOTAL KNEE ARTHROPLASTY;  Surgeon: Mcarthur Rossetti, MD;  Location: [place];  Service: Orthopedics;  Laterality: Right;  . TUBAL LIGATION  1981    Family History  Problem Relation Age of Onset  . Hypertension Other        Strong family history   . Breast  cancer Mother 37  . Heart failure Sister   . Heart disease Sister     Allergies  Allergen Reactions  . Naproxen Sodium Anaphylaxis and Swelling  . Sulfamethoxazole-Trimethoprim Anaphylaxis and Swelling       Assessment & Plan:   1. ESRD (end stage renal disease) ([place]) Overall today based on the noninvasive studies the patient's dialysis access is performing well.  No significant edema.  Based on this we will have the patient return to the office in 6 months for evaluation of her renal access.  Patient will contact her office sooner if reevaluation is needed - VAS [place] (AVF, AVG); Future  2. HYPERCHOLESTEROLEMIA Good lipid control is appropriate for slowing worsening atherosclerotic effects of end-stage renal disease.  Patient on appropriate medication.  No changes needed  3. Essential hypertension, benign Good blood pressure control.  Patient on appropriate medications.  No change   Current Outpatient Medications on File Prior to Visit  Medication Sig Dispense Refill  . acetaminophen (TYLENOL) 500 MG tablet Take 500-1,000 mg by mouth every 6 (six) hours as needed (for pain.).    [name] aspirin EC 81 MG tablet Take 81 mg by mouth daily.    [name] atorvastatin (LIPITOR) 40 MG tablet TAKE 40 MG BY MOUTH ONCE DAILY (Patient taking differently: Take 40 mg by mouth daily. Take 40 mg by mouth once daily) 90 tablet 3  . levETIRAcetam (KEPPRA) 1000 MG tablet TAKE 1 TABLET BY MOUTH TWICE A DAY (Patient taking differently: Take 1,000 mg by mouth 2 (two) times daily. ) 180 tablet 3  . lidocaine-prilocaine (EMLA) cream Apply 1 application topically every Monday, Wednesday, and Friday with hemodialysis.    [name] losartan  (COZAAR) 50 MG tablet Take 50 mg by mouth daily.    . metoprolol tartrate (LOPRESSOR) 25 MG tablet Take 25 mg by mouth every evening.     . multivitamin (RENA-VIT) TABS tablet Take 1 tablet by mouth at bedtime. 30 tablet 0  . sevelamer carbonate (RENVELA) 800 MG tablet Take 800-1,600 mg by mouth See admin instructions. Take 2 tablets (1600 mg) by mouth with each meal & 1 tablet (800 mg) by mouth with each snack    . torsemide (DEMADEX) 20 MG tablet Take 20 mg by mouth 4 (four) times a week. Take 1 tablet (20 mg) by mouth on Tuesdays, Thursdays, Saturdays, & Sundays in the morning on non-dialysis days.     No current facility-administered medications on file prior to visit.    There are no Patient Instructions on file for this visit. No follow-ups on file.   Kris Hartmann, NP

## 2019-08-31 DIAGNOSIS — Z992 Dependence on renal dialysis: Secondary | ICD-10-CM | POA: Diagnosis not present

## 2019-08-31 DIAGNOSIS — N186 End stage renal disease: Secondary | ICD-10-CM | POA: Diagnosis not present

## 2019-09-02 DIAGNOSIS — Z992 Dependence on renal dialysis: Secondary | ICD-10-CM | POA: Diagnosis not present

## 2019-09-02 DIAGNOSIS — N186 End stage renal disease: Secondary | ICD-10-CM | POA: Diagnosis not present

## 2019-09-04 DIAGNOSIS — Z992 Dependence on renal dialysis: Secondary | ICD-10-CM | POA: Diagnosis not present

## 2019-09-04 DIAGNOSIS — N186 End stage renal disease: Secondary | ICD-10-CM | POA: Diagnosis not present

## 2019-09-07 DIAGNOSIS — Z992 Dependence on renal dialysis: Secondary | ICD-10-CM | POA: Diagnosis not present

## 2019-09-07 DIAGNOSIS — N186 End stage renal disease: Secondary | ICD-10-CM | POA: Diagnosis not present

## 2019-09-09 DIAGNOSIS — N186 End stage renal disease: Secondary | ICD-10-CM | POA: Diagnosis not present

## 2019-09-09 DIAGNOSIS — Z992 Dependence on renal dialysis: Secondary | ICD-10-CM | POA: Diagnosis not present

## 2019-09-11 DIAGNOSIS — N186 End stage renal disease: Secondary | ICD-10-CM | POA: Diagnosis not present

## 2019-09-11 DIAGNOSIS — Z992 Dependence on renal dialysis: Secondary | ICD-10-CM | POA: Diagnosis not present

## 2019-09-14 DIAGNOSIS — Z992 Dependence on renal dialysis: Secondary | ICD-10-CM | POA: Diagnosis not present

## 2019-09-14 DIAGNOSIS — N186 End stage renal disease: Secondary | ICD-10-CM | POA: Diagnosis not present

## 2019-09-16 DIAGNOSIS — N186 End stage renal disease: Secondary | ICD-10-CM | POA: Diagnosis not present

## 2019-09-16 DIAGNOSIS — Z992 Dependence on renal dialysis: Secondary | ICD-10-CM | POA: Diagnosis not present

## 2019-09-18 DIAGNOSIS — Z992 Dependence on renal dialysis: Secondary | ICD-10-CM | POA: Diagnosis not present

## 2019-09-18 DIAGNOSIS — N186 End stage renal disease: Secondary | ICD-10-CM | POA: Diagnosis not present

## 2019-09-21 DIAGNOSIS — N186 End stage renal disease: Secondary | ICD-10-CM | POA: Diagnosis not present

## 2019-09-21 DIAGNOSIS — Z992 Dependence on renal dialysis: Secondary | ICD-10-CM | POA: Diagnosis not present

## 2019-09-23 DIAGNOSIS — Z992 Dependence on renal dialysis: Secondary | ICD-10-CM | POA: Diagnosis not present

## 2019-09-23 DIAGNOSIS — N186 End stage renal disease: Secondary | ICD-10-CM | POA: Diagnosis not present

## 2019-09-25 DIAGNOSIS — N186 End stage renal disease: Secondary | ICD-10-CM | POA: Diagnosis not present

## 2019-09-25 DIAGNOSIS — Z992 Dependence on renal dialysis: Secondary | ICD-10-CM | POA: Diagnosis not present

## 2019-09-28 DIAGNOSIS — N186 End stage renal disease: Secondary | ICD-10-CM | POA: Diagnosis not present

## 2019-09-28 DIAGNOSIS — E119 Type 2 diabetes mellitus without complications: Secondary | ICD-10-CM | POA: Diagnosis not present

## 2019-09-28 DIAGNOSIS — Z992 Dependence on renal dialysis: Secondary | ICD-10-CM | POA: Diagnosis not present

## 2019-09-30 DIAGNOSIS — Z992 Dependence on renal dialysis: Secondary | ICD-10-CM | POA: Diagnosis not present

## 2019-09-30 DIAGNOSIS — N186 End stage renal disease: Secondary | ICD-10-CM | POA: Diagnosis not present

## 2019-10-02 DIAGNOSIS — N186 End stage renal disease: Secondary | ICD-10-CM | POA: Diagnosis not present

## 2019-10-02 DIAGNOSIS — Z992 Dependence on renal dialysis: Secondary | ICD-10-CM | POA: Diagnosis not present

## 2019-10-05 DIAGNOSIS — Z992 Dependence on renal dialysis: Secondary | ICD-10-CM | POA: Diagnosis not present

## 2019-10-05 DIAGNOSIS — N186 End stage renal disease: Secondary | ICD-10-CM | POA: Diagnosis not present

## 2019-10-07 DIAGNOSIS — N186 End stage renal disease: Secondary | ICD-10-CM | POA: Diagnosis not present

## 2019-10-07 DIAGNOSIS — Z992 Dependence on renal dialysis: Secondary | ICD-10-CM | POA: Diagnosis not present

## 2019-10-09 DIAGNOSIS — N186 End stage renal disease: Secondary | ICD-10-CM | POA: Diagnosis not present

## 2019-10-09 DIAGNOSIS — Z992 Dependence on renal dialysis: Secondary | ICD-10-CM | POA: Diagnosis not present

## 2019-10-12 DIAGNOSIS — Z992 Dependence on renal dialysis: Secondary | ICD-10-CM | POA: Diagnosis not present

## 2019-10-12 DIAGNOSIS — N186 End stage renal disease: Secondary | ICD-10-CM | POA: Diagnosis not present

## 2019-10-12 NOTE — Progress Notes (Signed)
Office Visit    Patient Name: Carrie [name] Date of Encounter: 10/13/2019  Primary Care Provider:  Venia Carbon, MD Primary Cardiologist:  Ida Rogue, MD Electrophysiologist:  None   Chief Complaint    Carrie [name] is a 69 y.o. female with a hx of chronic diastolic CHF, HTN, HLD, DM2, anemia, ESRD, morbid obesity, hx brain cancer per patient report, seizures presents today for follow up of chronic diastolic heart failure.    Past Medical History    Past Medical History:  Diagnosis Date  . Allergy   . Anemia   . Arthritis    "right knee" (10/26'/2017)  . CKD (chronic kidney disease), stage III    12/2015 (Dr. Lavonia Dana)  . Depression   . History of blood transfusion 2008   "when I had brain tumor surgery"  . History of MRSA infection 2008  . Hypertension   . 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 Type II diabetes mellitus ([place]) 2011   on metformin until yr ago- no med now - off due to kidneys   Past Surgical History:  Procedure Laterality Date  . A/V FISTULAGRAM Left 03/10/2019   Procedure: A/V FISTULAGRAM;  Surgeon: Katha Cabal, MD;  Location: [place] [facility];  Service: Cardiovascular;  Laterality: Left;  . A/V FISTULAGRAM Left 07/14/2019   Procedure: A/V FISTULAGRAM;  Surgeon: Katha Cabal, MD;  Location: [place] [facility];  Service: Cardiovascular;  Laterality: Left;  . AV FISTULA PLACEMENT Left 11/07/2018   Procedure: ARTERIOVENOUS (AV) FISTULA CREATION ( BRACHIO BASILIC ) VS BRACHIAL AXILLARY GRAFT;  Surgeon: Katha Cabal, MD;  Location: [facility];  Service: Vascular;  Laterality: Left;  . BRAIN MENINGIOMA EXCISION  08/2006   Foramina magnum meningioma  . CERVICAL DISCECTOMY  1996   Dr. Alric Seton  . CESAREAN SECTION  1981  . COLONOSCOPY    . DIALYSIS/PERMA CATHETER INSERTION N/A 08/04/2018   Procedure: DIALYSIS/PERMA CATHETER INSERTION;  Surgeon: Algernon Huxley, MD;  Location: [place] [facility];  Service: Cardiovascular;   Laterality: N/A;  . DIALYSIS/PERMA CATHETER REMOVAL N/A 12/25/2018   Procedure: DIALYSIS/PERMA CATHETER REMOVAL;  Surgeon: Algernon Huxley, MD;  Location: [place] [facility];  Service: Cardiovascular;  Laterality: N/A;  . DILATION AND CURETTAGE OF UTERUS    . JOINT REPLACEMENT     right knee left hip  . Right wrist x-ray  2007   Deg. at 1st MCP  . TOTAL HIP ARTHROPLASTY Left 03/27/2016   Procedure: LEFT TOTAL HIP ARTHROPLASTY ANTERIOR APPROACH;  Surgeon: Mcarthur Rossetti, MD;  Location: [place];  Service: Orthopedics;  Laterality: Left;  . TOTAL KNEE ARTHROPLASTY Right 04/30/2017   Procedure: RIGHT TOTAL KNEE ARTHROPLASTY;  Surgeon: Mcarthur Rossetti, MD;  Location: [place];  Service: Orthopedics;  Laterality: Right;  . TUBAL LIGATION  1981    Allergies  Allergies  Allergen Reactions  . Naproxen Sodium Anaphylaxis and Swelling  . Sulfamethoxazole-Trimethoprim Anaphylaxis and Swelling    History of Present Illness    Carrie [name] is a 69 y.o. female with a hx of  chronic diastolic CHF, HTN, HLD, DM2, anemia, ESRD, morbid obesity, hx brain cancer per patient report, seizures. She was last seen 10/2018 by Dr. Rockey Situ.  Echo 10/2017 with VEF 60-65%, no RWMA, mild LVH, gr1DD, mild AV stenosis (trileaflet), LA mildly dilated.   She had a left brachial axillary graft placed by [facility] on 12/2018. Subsequent A/V fistulogram with [facility] on  03/10/19 and 07/14/19.   Very pleasant lady who reports routine follow-up.  She tells me she feels very well.  Has been busy with dialysis and playing games such as spades, solitaire, candy crush on her tablet.  She reports her dialysis access has been working well.  She attends HD early in the morning and then goes home and stays busy around the house with activities such as vacuuming, laundry.  She reports no chest pain, pressure, tightness with these activities.  Reports her BP at HD is generally 110-150/50-80.  She monitor blood pressure carefully at home  and report is that routinely at goal of less than 130/80. Endorses eating a low salt diet.  Reports no lower extremity edema, orthopnea, PND.  Did have 1 day recently where her blood pressure was elevated at dialysis but she never could figure out why any improved when she got home and rechecked a few hours later.  EKGs/Labs/Other Studies Reviewed:   The following studies were reviewed today: Echo 10/2017 Left ventricle: The cavity size was normal. Wall thickness was    increased in a pattern of mild LVH. Systolic function was normal.    The estimated ejection fraction was in the range of 60% to 65%.    Wall motion was normal; there were no regional wall motion    abnormalities. Doppler parameters are consistent with abnormal    left ventricular relaxation (grade 1 diastolic dysfunction).  - Aortic valve: Poorly visualized. Trileaflet. There was mild    stenosis. Mean gradient (S): 12 mm Hg. Valve area (VTI): 1.81    cm^2.  - Mitral valve: There was no significant regurgitation.  - Left atrium: The atrium was moderately dilated.  - Right ventricle: The cavity size was normal. Systolic function    was normal.  - Pulmonary arteries: No complete TR doppler jet so unable to    estimate PA systolic pressure.  - Inferior vena cava: The vessel was normal in size. The    respirophasic diameter changes were in the normal range (>= 50%),    consistent with normal central venous pressure.  - Pericardium, extracardiac: Small pericardial effusion primarily    adjacent to the RV/RA. No tamponade.   Impressions:   - Normal LV size with mild LV hypertrophy. EF 60-65%. Normal RV    size and systolic function. Mild aortic stenosis.    EKG:  EKG is ordered today.  The ekg ordered today demonstrates NSR 74 bpm with no acute ST/T wave changes.   Recent Labs: 11/04/2018: BUN 36; Creatinine, Ser 5.46; Platelets 278 11/07/2018: Hemoglobin 13.3; Potassium 3.7; Sodium 137  Recent Lipid Panel    Component  Value Date/Time   CHOL 178 12/20/2017 1145   TRIG 85.0 12/20/2017 1145   HDL 48.70 12/20/2017 1145   CHOLHDL 4 12/20/2017 1145   VLDL 17.0 12/20/2017 1145   LDLCALC 113 (H) 12/20/2017 1145   LDLDIRECT 197.0 07/15/2009 1035    Home Medications   Current Meds  Medication Sig  . acetaminophen (TYLENOL) 500 MG tablet Take 500-1,000 mg by mouth every 6 (six) hours as needed (for pain.).  [name] aspirin EC 81 MG tablet Take 81 mg by mouth daily.  [name] atorvastatin (LIPITOR) 40 MG tablet TAKE 40 MG BY MOUTH ONCE DAILY (Patient taking differently: Take 40 mg by mouth daily. Take 40 mg by mouth once daily)  . levETIRAcetam (KEPPRA) 1000 MG tablet TAKE 1 TABLET BY MOUTH TWICE A DAY (Patient taking differently: Take 1,000 mg by mouth 2 (  two) times daily. )  . lidocaine-prilocaine (EMLA) cream Apply 1 application topically every Monday, Wednesday, and Friday with hemodialysis.  [name] LORazepam (ATIVAN) 0.5 MG tablet Take 1 tablet (0.5 mg total) by mouth 2 (two) times daily as needed for anxiety.  [name] losartan (COZAAR) 50 MG tablet Take 25 mg by mouth daily.   . metoprolol tartrate (LOPRESSOR) 25 MG tablet Take 25 mg by mouth every evening.   . multivitamin (RENA-VIT) TABS tablet Take 1 tablet by mouth at bedtime.  . sevelamer carbonate (RENVELA) 800 MG tablet Take 800-1,600 mg by mouth See admin instructions. Take 2 tablets (1600 mg) by mouth with each meal & 1 tablet (800 mg) by mouth with each snack  . torsemide (DEMADEX) 20 MG tablet Take 20 mg by mouth 4 (four) times a week. Take 1 tablet (20 mg) by mouth in the morning on Tuesdays, Thursdays, Saturdays, & Sundays in the morning on non-dialysis days.    Review of Systems    Review of Systems  Constitution: Negative for chills, fever and malaise/fatigue.  Cardiovascular: Negative for chest pain, dyspnea on exertion, leg swelling, near-syncope, orthopnea, palpitations and syncope.  Respiratory: Negative for cough, shortness of breath and wheezing.     Gastrointestinal: Negative for nausea and vomiting.  Neurological: Negative for dizziness, light-headedness and weakness.   All other systems reviewed and are otherwise negative except as noted above.  Physical Exam    VS:  BP 132/86 (BP Location: Right Arm, Patient Position: Sitting, Cuff Size: Normal)   Pulse 74   Ht 5\' 3"  (1.6 m)   Wt 245 lb 2 oz (111.2 kg)   SpO2 96%   BMI 43.42 kg/m  , BMI Body mass index is 43.42 kg/m. GEN: Well nourished, overweight overweigh, well developed, in no acute distress. HEENT: normal. Neck: Supple, no JVD, carotid bruits, or masses. Cardiac: RRR, no murmurs, rubs, or gallops. No clubbing, cyanosis, edema.  Radials/PT 2+ and equal bilaterally.  Left upper extremity dialysis access with positive thrill and bruit. Respiratory:  Respirations regular and unlabored, clear to auscultation bilaterally. GI: Soft, nontender, nondistended. MS: No deformity or atrophy. Skin: Warm and dry, no rash. Neuro:  Strength and sensation are intact. Psych: Normal affect  Accessory Clinical Findings    ECG personally reviewed by me today - NSR 74 bpm - no acute changes.  Assessment & Plan    1. Chronic diastolic heart failure - Echo 10/2017 LVEF 60-65%, gr1DD.  Volume status managed by nephrology and HD.  She takes torsemide 20 mg daily on her nondialysis days.  She reports only minimal dyspnea on exertion on days that she is due for dialysis.  Reports no edema, orthopnea, PND.  No indication for repeat echocardiogram at this time.  Recommend she continue her low-sodium, heart healthy diet.  2. HTN -BP well controlled today.  Continue present antihypertensive regimen including losartan 25 mg daily, metoprolol tartrate 25 mg.  3. HLD -continue atorvastatin 40 mg daily.  Recommend LDL goal less than 70.  Tells me she has upcoming labs with her primary care provider.  4. ESRD -attends dialysis Monday Wednesday Friday and reports this has been going well.  Left upper  extremity dialysis graft with positive bruit and thrill.  Disposition: Follow up in 1 year(s) with Dr. Rockey Situ or APP.   Loel Dubonnet, NP 10/13/2019, 10:19 AM

## 2019-10-13 ENCOUNTER — Ambulatory Visit: Payer: Medicare HMO | Admitting: Family

## 2019-10-13 ENCOUNTER — Encounter: Payer: Self-pay | Admitting: Family

## 2019-10-13 ENCOUNTER — Other Ambulatory Visit: Payer: Self-pay

## 2019-10-13 VITALS — BP 132/86 | HR 74 | Ht 63.0 in | Wt 245.1 lb

## 2019-10-13 DIAGNOSIS — I10 Essential (primary) hypertension: Secondary | ICD-10-CM

## 2019-10-13 DIAGNOSIS — N186 End stage renal disease: Secondary | ICD-10-CM

## 2019-10-13 DIAGNOSIS — I5032 Chronic diastolic (congestive) heart failure: Secondary | ICD-10-CM | POA: Diagnosis not present

## 2019-10-13 DIAGNOSIS — E782 Mixed hyperlipidemia: Secondary | ICD-10-CM | POA: Diagnosis not present

## 2019-10-13 NOTE — Patient Instructions (Addendum)
Medication Instructions:  No medication changes today.   *If you need a refill on your cardiac medications before your next appointment, please call your pharmacy*  Lab Work: No lab work today.   Testing/Procedures: Your EKG today showed normal sinus rhythm with no changes worrisome for blockages or abnormal heart rhythms. This is a great result!  Follow-Up: At [facility], you and your health needs are our priority.  As part of our continuing mission to provide you with exceptional heart care, we have created designated Provider Care Teams.  These Care Teams include your primary Cardiologist (physician) and Advanced Practice Providers (APPs -  Physician Assistants and Nurse Practitioners) who all work together to provide you with the care you need, when you need it.  Your next appointment:   1 year(s)  The format for your next appointment:   In Person  Provider:   You may see Ida Rogue, MD or one of the following Advanced Practice Providers on your designated Care Team:    Murray Hodgkins, NP  Christell Faith, PA-C  Marrianne Mood, PA-C  Laurann Montana, NP-C  Other Instructions  Gwenlyn Saran doing a great job keeping up a low salt diet.   Keep up the good work staying busy. It is making a remarkable difference in your health!

## 2019-10-14 DIAGNOSIS — N186 End stage renal disease: Secondary | ICD-10-CM | POA: Diagnosis not present

## 2019-10-14 DIAGNOSIS — Z992 Dependence on renal dialysis: Secondary | ICD-10-CM | POA: Diagnosis not present

## 2019-10-16 DIAGNOSIS — N186 End stage renal disease: Secondary | ICD-10-CM | POA: Diagnosis not present

## 2019-10-16 DIAGNOSIS — Z992 Dependence on renal dialysis: Secondary | ICD-10-CM | POA: Diagnosis not present

## 2019-10-19 ENCOUNTER — Other Ambulatory Visit: Payer: Self-pay | Admitting: Internal Medicine

## 2019-10-19 DIAGNOSIS — N186 End stage renal disease: Secondary | ICD-10-CM | POA: Diagnosis not present

## 2019-10-19 DIAGNOSIS — Z992 Dependence on renal dialysis: Secondary | ICD-10-CM | POA: Diagnosis not present

## 2019-10-19 NOTE — Telephone Encounter (Signed)
Last filled 08-27-19 #60 Last OV 01-27-19 Next OV 02-02-20 [facility]

## 2019-10-21 DIAGNOSIS — Z992 Dependence on renal dialysis: Secondary | ICD-10-CM | POA: Diagnosis not present

## 2019-10-21 DIAGNOSIS — N186 End stage renal disease: Secondary | ICD-10-CM | POA: Diagnosis not present

## 2019-10-23 DIAGNOSIS — N186 End stage renal disease: Secondary | ICD-10-CM | POA: Diagnosis not present

## 2019-10-23 DIAGNOSIS — Z992 Dependence on renal dialysis: Secondary | ICD-10-CM | POA: Diagnosis not present

## 2019-10-26 DIAGNOSIS — N186 End stage renal disease: Secondary | ICD-10-CM | POA: Diagnosis not present

## 2019-10-26 DIAGNOSIS — Z992 Dependence on renal dialysis: Secondary | ICD-10-CM | POA: Diagnosis not present

## 2019-10-28 DIAGNOSIS — N186 End stage renal disease: Secondary | ICD-10-CM | POA: Diagnosis not present

## 2019-10-28 DIAGNOSIS — Z992 Dependence on renal dialysis: Secondary | ICD-10-CM | POA: Diagnosis not present

## 2019-10-29 DIAGNOSIS — H524 Presbyopia: Secondary | ICD-10-CM | POA: Diagnosis not present

## 2019-10-29 DIAGNOSIS — Z0100 Encounter for examination of eyes and vision without abnormal findings: Secondary | ICD-10-CM | POA: Diagnosis not present

## 2019-10-30 DIAGNOSIS — N186 End stage renal disease: Secondary | ICD-10-CM | POA: Diagnosis not present

## 2019-10-30 DIAGNOSIS — Z992 Dependence on renal dialysis: Secondary | ICD-10-CM | POA: Diagnosis not present

## 2019-11-02 DIAGNOSIS — Z992 Dependence on renal dialysis: Secondary | ICD-10-CM | POA: Diagnosis not present

## 2019-11-02 DIAGNOSIS — N186 End stage renal disease: Secondary | ICD-10-CM | POA: Diagnosis not present

## 2019-11-04 DIAGNOSIS — N186 End stage renal disease: Secondary | ICD-10-CM | POA: Diagnosis not present

## 2019-11-04 DIAGNOSIS — Z992 Dependence on renal dialysis: Secondary | ICD-10-CM | POA: Diagnosis not present

## 2019-11-06 DIAGNOSIS — Z992 Dependence on renal dialysis: Secondary | ICD-10-CM | POA: Diagnosis not present

## 2019-11-06 DIAGNOSIS — N186 End stage renal disease: Secondary | ICD-10-CM | POA: Diagnosis not present

## 2019-11-09 DIAGNOSIS — Z992 Dependence on renal dialysis: Secondary | ICD-10-CM | POA: Diagnosis not present

## 2019-11-09 DIAGNOSIS — N186 End stage renal disease: Secondary | ICD-10-CM | POA: Diagnosis not present

## 2019-11-11 DIAGNOSIS — N186 End stage renal disease: Secondary | ICD-10-CM | POA: Diagnosis not present

## 2019-11-11 DIAGNOSIS — Z992 Dependence on renal dialysis: Secondary | ICD-10-CM | POA: Diagnosis not present

## 2019-11-13 DIAGNOSIS — Z992 Dependence on renal dialysis: Secondary | ICD-10-CM | POA: Diagnosis not present

## 2019-11-13 DIAGNOSIS — N186 End stage renal disease: Secondary | ICD-10-CM | POA: Diagnosis not present

## 2019-11-16 DIAGNOSIS — Z992 Dependence on renal dialysis: Secondary | ICD-10-CM | POA: Diagnosis not present

## 2019-11-16 DIAGNOSIS — N186 End stage renal disease: Secondary | ICD-10-CM | POA: Diagnosis not present

## 2019-11-18 DIAGNOSIS — N186 End stage renal disease: Secondary | ICD-10-CM | POA: Diagnosis not present

## 2019-11-18 DIAGNOSIS — Z992 Dependence on renal dialysis: Secondary | ICD-10-CM | POA: Diagnosis not present

## 2019-11-20 DIAGNOSIS — Z992 Dependence on renal dialysis: Secondary | ICD-10-CM | POA: Diagnosis not present

## 2019-11-20 DIAGNOSIS — N186 End stage renal disease: Secondary | ICD-10-CM | POA: Diagnosis not present

## 2019-11-23 DIAGNOSIS — N186 End stage renal disease: Secondary | ICD-10-CM | POA: Diagnosis not present

## 2019-11-23 DIAGNOSIS — Z992 Dependence on renal dialysis: Secondary | ICD-10-CM | POA: Diagnosis not present

## 2019-11-25 DIAGNOSIS — N186 End stage renal disease: Secondary | ICD-10-CM | POA: Diagnosis not present

## 2019-11-25 DIAGNOSIS — Z992 Dependence on renal dialysis: Secondary | ICD-10-CM | POA: Diagnosis not present

## 2019-11-27 DIAGNOSIS — Z992 Dependence on renal dialysis: Secondary | ICD-10-CM | POA: Diagnosis not present

## 2019-11-27 DIAGNOSIS — N186 End stage renal disease: Secondary | ICD-10-CM | POA: Diagnosis not present

## 2019-11-30 DIAGNOSIS — N186 End stage renal disease: Secondary | ICD-10-CM | POA: Diagnosis not present

## 2019-11-30 DIAGNOSIS — Z992 Dependence on renal dialysis: Secondary | ICD-10-CM | POA: Diagnosis not present

## 2019-12-02 DIAGNOSIS — N186 End stage renal disease: Secondary | ICD-10-CM | POA: Diagnosis not present

## 2019-12-02 DIAGNOSIS — Z992 Dependence on renal dialysis: Secondary | ICD-10-CM | POA: Diagnosis not present

## 2019-12-04 DIAGNOSIS — N186 End stage renal disease: Secondary | ICD-10-CM | POA: Diagnosis not present

## 2019-12-04 DIAGNOSIS — Z992 Dependence on renal dialysis: Secondary | ICD-10-CM | POA: Diagnosis not present

## 2019-12-07 DIAGNOSIS — N186 End stage renal disease: Secondary | ICD-10-CM | POA: Diagnosis not present

## 2019-12-07 DIAGNOSIS — Z992 Dependence on renal dialysis: Secondary | ICD-10-CM | POA: Diagnosis not present

## 2019-12-09 ENCOUNTER — Other Ambulatory Visit: Payer: Self-pay | Admitting: Internal Medicine

## 2019-12-09 DIAGNOSIS — Z992 Dependence on renal dialysis: Secondary | ICD-10-CM | POA: Diagnosis not present

## 2019-12-09 DIAGNOSIS — N186 End stage renal disease: Secondary | ICD-10-CM | POA: Diagnosis not present

## 2019-12-10 NOTE — Telephone Encounter (Signed)
Last filled 10-19-19 #60 Last OV 01-27-19 Next OV 02-02-20 [facility]

## 2019-12-11 DIAGNOSIS — Z992 Dependence on renal dialysis: Secondary | ICD-10-CM | POA: Diagnosis not present

## 2019-12-11 DIAGNOSIS — N186 End stage renal disease: Secondary | ICD-10-CM | POA: Diagnosis not present

## 2019-12-14 DIAGNOSIS — N186 End stage renal disease: Secondary | ICD-10-CM | POA: Diagnosis not present

## 2019-12-14 DIAGNOSIS — Z992 Dependence on renal dialysis: Secondary | ICD-10-CM | POA: Diagnosis not present

## 2019-12-16 DIAGNOSIS — Z992 Dependence on renal dialysis: Secondary | ICD-10-CM | POA: Diagnosis not present

## 2019-12-16 DIAGNOSIS — N186 End stage renal disease: Secondary | ICD-10-CM | POA: Diagnosis not present

## 2019-12-18 DIAGNOSIS — N186 End stage renal disease: Secondary | ICD-10-CM | POA: Diagnosis not present

## 2019-12-18 DIAGNOSIS — Z992 Dependence on renal dialysis: Secondary | ICD-10-CM | POA: Diagnosis not present

## 2019-12-21 DIAGNOSIS — Z992 Dependence on renal dialysis: Secondary | ICD-10-CM | POA: Diagnosis not present

## 2019-12-21 DIAGNOSIS — N186 End stage renal disease: Secondary | ICD-10-CM | POA: Diagnosis not present

## 2019-12-23 DIAGNOSIS — N186 End stage renal disease: Secondary | ICD-10-CM | POA: Diagnosis not present

## 2019-12-23 DIAGNOSIS — Z992 Dependence on renal dialysis: Secondary | ICD-10-CM | POA: Diagnosis not present

## 2019-12-25 DIAGNOSIS — Z992 Dependence on renal dialysis: Secondary | ICD-10-CM | POA: Diagnosis not present

## 2019-12-25 DIAGNOSIS — N186 End stage renal disease: Secondary | ICD-10-CM | POA: Diagnosis not present

## 2019-12-28 DIAGNOSIS — E119 Type 2 diabetes mellitus without complications: Secondary | ICD-10-CM | POA: Diagnosis not present

## 2019-12-28 DIAGNOSIS — Z992 Dependence on renal dialysis: Secondary | ICD-10-CM | POA: Diagnosis not present

## 2019-12-28 DIAGNOSIS — N186 End stage renal disease: Secondary | ICD-10-CM | POA: Diagnosis not present

## 2019-12-29 ENCOUNTER — Other Ambulatory Visit: Payer: Self-pay

## 2019-12-29 ENCOUNTER — Encounter (INDEPENDENT_AMBULATORY_CARE_PROVIDER_SITE_OTHER): Payer: Self-pay | Admitting: Nurse Practitioner

## 2019-12-29 ENCOUNTER — Ambulatory Visit (INDEPENDENT_AMBULATORY_CARE_PROVIDER_SITE_OTHER): Payer: Medicare HMO | Admitting: Nurse Practitioner

## 2019-12-29 ENCOUNTER — Ambulatory Visit (INDEPENDENT_AMBULATORY_CARE_PROVIDER_SITE_OTHER): Payer: Medicare HMO

## 2019-12-29 ENCOUNTER — Telehealth (INDEPENDENT_AMBULATORY_CARE_PROVIDER_SITE_OTHER): Payer: Self-pay

## 2019-12-29 VITALS — BP 151/81 | HR 71 | Resp 16 | Wt 247.0 lb

## 2019-12-29 DIAGNOSIS — N186 End stage renal disease: Secondary | ICD-10-CM

## 2019-12-29 DIAGNOSIS — I10 Essential (primary) hypertension: Secondary | ICD-10-CM

## 2019-12-29 DIAGNOSIS — E7800 Pure hypercholesterolemia, unspecified: Secondary | ICD-10-CM

## 2019-12-29 DIAGNOSIS — E1121 Type 2 diabetes mellitus with diabetic nephropathy: Secondary | ICD-10-CM

## 2019-12-30 ENCOUNTER — Encounter (INDEPENDENT_AMBULATORY_CARE_PROVIDER_SITE_OTHER): Payer: Self-pay | Admitting: Nurse Practitioner

## 2019-12-30 DIAGNOSIS — N186 End stage renal disease: Secondary | ICD-10-CM | POA: Diagnosis not present

## 2019-12-30 DIAGNOSIS — Z992 Dependence on renal dialysis: Secondary | ICD-10-CM | POA: Diagnosis not present

## 2019-12-30 NOTE — H&P (View-Only) (Signed)
Subjective:    Patient ID: Carrie [name], female    DOB: 21-Dec-1950, 69 y.o.   MRN: [id number] Chief Complaint  Patient presents with  . Follow-up    ref [place] HDA    The patient returns to the office for follow up regarding problem with the dialysis access. Currently the patient is maintained via a left brachial axillary AV graft.  The patient notes that they have recently had issues with cannulating her graft.  She had some severe swelling and bruising that is slowly improving.  There is evidence that there is a hematoma near the area.    The patient denies hand pain or other symptoms consistent with steal phenomena.  No significant arm swelling.  . The patient denies fever or chills at home or while on dialysis.  The patient denies amaurosis fugax or recent TIA symptoms. There are no recent neurological changes noted. The patient denies claudication symptoms or rest pain symptoms. The patient denies history of DVT, PE or superficial thrombophlebitis. The patient denies recent episodes of angina or shortness of breath.   Today noninvasive studies show flow volume of 736.  Previously the patient had a flow volume of 1254.  There also appears to be a stricture in the proximal to mid segment.  There is a significant increase from the distal to mid graft.   Review of Systems  Hematological: Bruises/bleeds easily.  All other systems reviewed and are negative.      Objective:   Physical Exam Vitals reviewed.  HENT:     Head: Normocephalic.  Cardiovascular:     Rate and Rhythm: Normal rate and regular rhythm.     Pulses: Normal pulses.     Heart sounds: Normal heart sounds.     Arteriovenous access: left arteriovenous access is present.    Comments: Evidence of hematoma and bruising at left AV graft area.  Soft thrill with adequate bruit Pulmonary:     Effort: Pulmonary effort is normal.  Skin:    General: Skin is warm and dry.     Findings: Bruising present.   Neurological:     Mental Status: She is alert and oriented to person, place, and time.  Psychiatric:        Mood and Affect: Mood normal.        Behavior: Behavior normal.        Thought Content: Thought content normal.        Judgment: Judgment normal.     BP (!) 151/81 (BP Location: Right Arm)   Pulse 71   Resp 16   Wt (!) 247 lb (112 kg)   BMI 43.75 kg/m   Past Medical History:  Diagnosis Date  . Allergy   . Anemia   . Arthritis    "right knee" (10/26'/2017)  . CKD (chronic kidney disease), stage III    12/2015 (Dr. Lavonia Dana)  . Depression   . History of blood transfusion 2008   "when I had brain tumor surgery"  . History of MRSA infection 2008  . Hypertension   . 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 Type II diabetes mellitus ([place]) 2011   on metformin until yr ago- no med now - off due to kidneys    Social History   Socioeconomic History  . Marital status: Widowed    Spouse name: Not on file  . Number of children: 1  . Years of education: Not on file  . Highest education level: Not on  file  Occupational History  . 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 Occupation: Disabled since brain surgery  Tobacco Use  . Smoking status: Never Smoker  . Smokeless tobacco: Never Used  Vaping Use  . Vaping Use: Never used  Substance and Sexual Activity  . Alcohol use: No    Alcohol/week: 0.0 standard drinks  . Drug use: No  . Sexual activity: Never  Other Topics Concern  . Not on file  Social History Narrative   Lives in family home with extended family.      No living will   Requests niece, [name], as health care POA   Would accept resuscitation attempts   No tube feeds if cognitively aware   Social Determinants of Health   Financial Resource Strain:   . Difficulty of Paying Living Expenses:   Food Insecurity:   . Worried About Charity fundraiser in the Last Year:   . Arboriculturist in the Last Year:   Transportation Needs:    . Film/video editor (Medical):   [name] Lack of Transportation (Non-Medical):   Physical Activity:   . Days of Exercise per Week:   . Minutes of Exercise per Session:   Stress:   . Feeling of Stress :   Social Connections:   . Frequency of Communication with Friends and Family:   . Frequency of Social Gatherings with Friends and Family:   . Attends Religious Services:   . Active Member of Clubs or Organizations:   . Attends Archivist Meetings:   [name] Marital Status:   Intimate Partner Violence:   . Fear of Current or Ex-Partner:   . Emotionally Abused:   [name] Physically Abused:   . Sexually Abused:     Past Surgical History:  Procedure Laterality Date  . A/V FISTULAGRAM Left 03/10/2019   Procedure: A/V FISTULAGRAM;  Surgeon: Katha Cabal, MD;  Location: [place] [facility];  Service: Cardiovascular;  Laterality: Left;  . A/V FISTULAGRAM Left 07/14/2019   Procedure: A/V FISTULAGRAM;  Surgeon: Katha Cabal, MD;  Location: [place] [facility];  Service: Cardiovascular;  Laterality: Left;  . AV FISTULA PLACEMENT Left 11/07/2018   Procedure: ARTERIOVENOUS (AV) FISTULA CREATION ( BRACHIO BASILIC ) VS BRACHIAL AXILLARY GRAFT;  Surgeon: Katha Cabal, MD;  Location: [facility];  Service: Vascular;  Laterality: Left;  . BRAIN MENINGIOMA EXCISION  08/2006   Foramina magnum meningioma  . CERVICAL DISCECTOMY  1996   Dr. Alric Seton  . CESAREAN SECTION  1981  . COLONOSCOPY    . DIALYSIS/PERMA CATHETER INSERTION N/A 08/04/2018   Procedure: DIALYSIS/PERMA CATHETER INSERTION;  Surgeon: Algernon Huxley, MD;  Location: [place] [facility];  Service: Cardiovascular;  Laterality: N/A;  . DIALYSIS/PERMA CATHETER REMOVAL N/A 12/25/2018   Procedure: DIALYSIS/PERMA CATHETER REMOVAL;  Surgeon: Algernon Huxley, MD;  Location: [place] [facility];  Service: Cardiovascular;  Laterality: N/A;  . DILATION AND CURETTAGE OF UTERUS    . JOINT REPLACEMENT     right knee left hip  . Right wrist x-ray   2007   Deg. at 1st MCP  . TOTAL HIP ARTHROPLASTY Left 03/27/2016   Procedure: LEFT TOTAL HIP ARTHROPLASTY ANTERIOR APPROACH;  Surgeon: Mcarthur Rossetti, MD;  Location: [place];  Service: Orthopedics;  Laterality: Left;  . TOTAL KNEE ARTHROPLASTY Right 04/30/2017   Procedure: RIGHT TOTAL KNEE ARTHROPLASTY;  Surgeon: Mcarthur Rossetti, MD;  Location: [place];  Service: Orthopedics;  Laterality: Right;  .  TUBAL LIGATION  1981    Family History  Problem Relation Age of Onset  . Hypertension Other        Strong family history   . Breast cancer Mother 23  . Heart failure Sister   . Heart disease Sister     Allergies  Allergen Reactions  . Naproxen Sodium Anaphylaxis and Swelling  . Sulfamethoxazole-Trimethoprim Anaphylaxis and Swelling       Assessment & Plan:   1. ESRD (end stage renal disease) ([place]) Recommend:  The patient is experiencing increasing problems with their dialysis access.  Patient should have a fistulagram with the intention for intervention.  The intention for intervention is to restore appropriate flow and prevent thrombosis and possible loss of the access.  As well as improve the quality of dialysis therapy.  The risks, benefits and alternative therapies were reviewed in detail with the patient.  All questions were answered.  The patient agrees to proceed with angio/intervention.      2. Controlled type 2 diabetes mellitus with diabetic nephropathy, without long-term current use of insulin ([place]) Continue hypoglycemic medications as already ordered, these medications have been reviewed and there are no changes at this time.  Hgb A1C to be monitored as already arranged by primary service   3. HYPERCHOLESTEROLEMIA Continue statin as ordered and reviewed, no changes at this time   4. Essential hypertension, benign Continue antihypertensive medications as already ordered, these medications have been reviewed and there are no changes at this  time.    Current Outpatient Medications on File Prior to Visit  Medication Sig Dispense Refill  . acetaminophen (TYLENOL) 500 MG tablet Take 500-1,000 mg by mouth every 6 (six) hours as needed (for pain.).    [name] aspirin EC 81 MG tablet Take 81 mg by mouth daily.    [name] atorvastatin (LIPITOR) 40 MG tablet TAKE 40 MG BY MOUTH ONCE DAILY (Patient taking differently: Take 40 mg by mouth daily. Take 40 mg by mouth once daily) 90 tablet 3  . levETIRAcetam (KEPPRA) 1000 MG tablet TAKE 1 TABLET BY MOUTH TWICE A DAY (Patient taking differently: Take 1,000 mg by mouth 2 (two) times daily. ) 180 tablet 3  . lidocaine-prilocaine (EMLA) cream Apply 1 application topically every Monday, Wednesday, and Friday with hemodialysis.    [name] LORazepam (ATIVAN) 0.5 MG tablet TAKE 1 TABLET (0.5 MG TOTAL) BY MOUTH 2 (TWO) TIMES DAILY AS NEEDED FOR ANXIETY. 60 tablet 0  . losartan (COZAAR) 50 MG tablet Take 25 mg by mouth daily.     . metoprolol tartrate (LOPRESSOR) 25 MG tablet Take 25 mg by mouth every evening.     . multivitamin (RENA-VIT) TABS tablet Take 1 tablet by mouth at bedtime. 30 tablet 0  . sevelamer carbonate (RENVELA) 800 MG tablet Take 800-1,600 mg by mouth See admin instructions. Take 2 tablets (1600 mg) by mouth with each meal & 1 tablet (800 mg) by mouth with each snack    . torsemide (DEMADEX) 20 MG tablet Take 20 mg by mouth 4 (four) times a week. Take 1 tablet (20 mg) by mouth in the morning on Tuesdays, Thursdays, Saturdays, & Sundays in the morning on non-dialysis days.     No current facility-administered medications on file prior to visit.    There are no Patient Instructions on file for this visit. No follow-ups on file.   Kris Hartmann, NP

## 2019-12-30 NOTE — Progress Notes (Signed)
Subjective:    Patient ID: Carrie [name], female    DOB: [birth year], 69 y.o.   MRN: [id number] Chief Complaint  Patient presents with  . Follow-up    ref [place] HDA    The patient returns to the office for follow up regarding problem with the dialysis access. Currently the patient is maintained via a left brachial axillary AV graft.  The patient notes that they have recently had issues with cannulating her graft.  She had some severe swelling and bruising that is slowly improving.  There is evidence that there is a hematoma near the area.    The patient denies hand pain or other symptoms consistent with steal phenomena.  No significant arm swelling.  . The patient denies fever or chills at home or while on dialysis.  The patient denies amaurosis fugax or recent TIA symptoms. There are no recent neurological changes noted. The patient denies claudication symptoms or rest pain symptoms. The patient denies history of DVT, PE or superficial thrombophlebitis. The patient denies recent episodes of angina or shortness of breath.   Today noninvasive studies show flow volume of 736.  Previously the patient had a flow volume of 1254.  There also appears to be a stricture in the proximal to mid segment.  There is a significant increase from the distal to mid graft.   Review of Systems  Hematological: Bruises/bleeds easily.  All other systems reviewed and are negative.      Objective:   Physical Exam Vitals reviewed.  HENT:     Head: Normocephalic.  Cardiovascular:     Rate and Rhythm: Normal rate and regular rhythm.     Pulses: Normal pulses.     Heart sounds: Normal heart sounds.     Arteriovenous access: left arteriovenous access is present.    Comments: Evidence of hematoma and bruising at left AV graft area.  Soft thrill with adequate bruit Pulmonary:     Effort: Pulmonary effort is normal.  Skin:    General: Skin is warm and dry.     Findings: Bruising present.    Neurological:     Mental Status: She is alert and oriented to person, place, and time.  Psychiatric:        Mood and Affect: Mood normal.        Behavior: Behavior normal.        Thought Content: Thought content normal.        Judgment: Judgment normal.     BP (!) 151/81 (BP Location: Right Arm)   Pulse 71   Resp 16   Wt (!) 247 lb (112 kg)   BMI 43.75 kg/m   Past Medical History:  Diagnosis Date  . Allergy   . Anemia   . Arthritis    "right knee" (10/26'/2017)  . CKD (chronic kidney disease), stage III    12/2015 (Dr. Lavonia Dana)  . Depression   . History of blood transfusion 2008   "when I had brain tumor surgery"  . History of MRSA infection 2008  . Hypertension   . 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 Type II diabetes mellitus ([place]) 2011   on metformin until yr ago- no med now - off due to kidneys    Social History   Socioeconomic History  . Marital status: Widowed    Spouse name: Not on file  . Number of children: 1  . Years of education: Not on file  . Highest education level: Not  on file  Occupational History  . 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 Occupation: Disabled since brain surgery  Tobacco Use  . Smoking status: Never Smoker  . Smokeless tobacco: Never Used  Vaping Use  . Vaping Use: Never used  Substance and Sexual Activity  . Alcohol use: No    Alcohol/week: 0.0 standard drinks  . Drug use: No  . Sexual activity: Never  Other Topics Concern  . Not on file  Social History Narrative   Lives in family home with extended family.      No living will   Requests niece, [name], as health care POA   Would accept resuscitation attempts   No tube feeds if cognitively aware   Social Determinants of Health   Financial Resource Strain:   . Difficulty of Paying Living Expenses:   Food Insecurity:   . Worried About Charity fundraiser in the Last Year:   . Arboriculturist in the Last Year:   Transportation Needs:    . Film/video editor (Medical):   [name] Lack of Transportation (Non-Medical):   Physical Activity:   . Days of Exercise per Week:   . Minutes of Exercise per Session:   Stress:   . Feeling of Stress :   Social Connections:   . Frequency of Communication with Friends and Family:   . Frequency of Social Gatherings with Friends and Family:   . Attends Religious Services:   . Active Member of Clubs or Organizations:   . Attends Archivist Meetings:   [name] Marital Status:   Intimate Partner Violence:   . Fear of Current or Ex-Partner:   . Emotionally Abused:   [name] Physically Abused:   . Sexually Abused:     Past Surgical History:  Procedure Laterality Date  . A/V FISTULAGRAM Left 03/10/2019   Procedure: A/V FISTULAGRAM;  Surgeon: Katha Cabal, MD;  Location: [facility];  Service: Cardiovascular;  Laterality: Left;  . A/V FISTULAGRAM Left 07/14/2019   Procedure: A/V FISTULAGRAM;  Surgeon: Katha Cabal, MD;  Location: [facility];  Service: Cardiovascular;  Laterality: Left;  . AV FISTULA PLACEMENT Left 11/07/2018   Procedure: ARTERIOVENOUS (AV) FISTULA CREATION ( BRACHIO BASILIC ) VS BRACHIAL AXILLARY GRAFT;  Surgeon: Katha Cabal, MD;  Location: [facility];  Service: Vascular;  Laterality: Left;  . BRAIN MENINGIOMA EXCISION  08/2006   Foramina magnum meningioma  . CERVICAL DISCECTOMY  1996   Dr. Alric Seton  . CESAREAN SECTION  1981  . COLONOSCOPY    . DIALYSIS/PERMA CATHETER INSERTION N/A 08/04/2018   Procedure: DIALYSIS/PERMA CATHETER INSERTION;  Surgeon: Algernon Huxley, MD;  Location: [facility];  Service: Cardiovascular;  Laterality: N/A;  . DIALYSIS/PERMA CATHETER REMOVAL N/A 12/25/2018   Procedure: DIALYSIS/PERMA CATHETER REMOVAL;  Surgeon: Algernon Huxley, MD;  Location: [facility];  Service: Cardiovascular;  Laterality: N/A;  . DILATION AND CURETTAGE OF UTERUS    . JOINT REPLACEMENT     right knee left hip  . Right wrist x-ray   2007   Deg. at 1st MCP  . TOTAL HIP ARTHROPLASTY Left 03/27/2016   Procedure: LEFT TOTAL HIP ARTHROPLASTY ANTERIOR APPROACH;  Surgeon: Mcarthur Rossetti, MD;  Location: [facility];  Service: Orthopedics;  Laterality: Left;  . TOTAL KNEE ARTHROPLASTY Right 04/30/2017   Procedure: RIGHT TOTAL KNEE ARTHROPLASTY;  Surgeon: Mcarthur Rossetti, MD;  Location: [facility];  Service: Orthopedics;  Laterality:  Right;  [name] TUBAL LIGATION  1981    Family History  Problem Relation Age of Onset  . Hypertension Other        Strong family history   . Breast cancer Mother 60  . Heart failure Sister   . Heart disease Sister     Allergies  Allergen Reactions  . Naproxen Sodium Anaphylaxis and Swelling  . Sulfamethoxazole-Trimethoprim Anaphylaxis and Swelling       Assessment & Plan:   1. ESRD (end stage renal disease) ([place]) Recommend:  The patient is experiencing increasing problems with their dialysis access.  Patient should have a fistulagram with the intention for intervention.  The intention for intervention is to restore appropriate flow and prevent thrombosis and possible loss of the access.  As well as improve the quality of dialysis therapy.  The risks, benefits and alternative therapies were reviewed in detail with the patient.  All questions were answered.  The patient agrees to proceed with angio/intervention.      2. Controlled type 2 diabetes mellitus with diabetic nephropathy, without long-term current use of insulin ([place]) Continue hypoglycemic medications as already ordered, these medications have been reviewed and there are no changes at this time.  Hgb A1C to be monitored as already arranged by primary service   3. HYPERCHOLESTEROLEMIA Continue statin as ordered and reviewed, no changes at this time   4. Essential hypertension, benign Continue antihypertensive medications as already ordered, these medications have been reviewed and there are no changes at this  time.    Current Outpatient Medications on File Prior to Visit  Medication Sig Dispense Refill  . acetaminophen (TYLENOL) 500 MG tablet Take 500-1,000 mg by mouth every 6 (six) hours as needed (for pain.).    [name] aspirin EC 81 MG tablet Take 81 mg by mouth daily.    [name] atorvastatin (LIPITOR) 40 MG tablet TAKE 40 MG BY MOUTH ONCE DAILY (Patient taking differently: Take 40 mg by mouth daily. Take 40 mg by mouth once daily) 90 tablet 3  . levETIRAcetam (KEPPRA) 1000 MG tablet TAKE 1 TABLET BY MOUTH TWICE A DAY (Patient taking differently: Take 1,000 mg by mouth 2 (two) times daily. ) 180 tablet 3  . lidocaine-prilocaine (EMLA) cream Apply 1 application topically every Monday, Wednesday, and Friday with hemodialysis.    [name] LORazepam (ATIVAN) 0.5 MG tablet TAKE 1 TABLET (0.5 MG TOTAL) BY MOUTH 2 (TWO) TIMES DAILY AS NEEDED FOR ANXIETY. 60 tablet 0  . losartan (COZAAR) 50 MG tablet Take 25 mg by mouth daily.     . metoprolol tartrate (LOPRESSOR) 25 MG tablet Take 25 mg by mouth every evening.     . multivitamin (RENA-VIT) TABS tablet Take 1 tablet by mouth at bedtime. 30 tablet 0  . sevelamer carbonate (RENVELA) 800 MG tablet Take 800-1,600 mg by mouth See admin instructions. Take 2 tablets (1600 mg) by mouth with each meal & 1 tablet (800 mg) by mouth with each snack    . torsemide (DEMADEX) 20 MG tablet Take 20 mg by mouth 4 (four) times a week. Take 1 tablet (20 mg) by mouth in the morning on Tuesdays, Thursdays, Saturdays, & Sundays in the morning on non-dialysis days.     No current facility-administered medications on file prior to visit.    There are no Patient Instructions on file for this visit. No follow-ups on file.   Kris Hartmann, NP

## 2020-01-01 DIAGNOSIS — Z992 Dependence on renal dialysis: Secondary | ICD-10-CM | POA: Diagnosis not present

## 2020-01-01 DIAGNOSIS — N186 End stage renal disease: Secondary | ICD-10-CM | POA: Diagnosis not present

## 2020-01-01 NOTE — Telephone Encounter (Signed)
Carrie [name] at [facility] left a message for the patient to be scheduled for Tuesday or Thursday next week. I have the patient scheduled on  02/07/20 with a 11:00 am arrival time to the MM with Dr. Delana Meyer. Covid testing on 02/05/20 between 8-1 pm at the [facility]. Pre-procedure instructions will be faxed to [facility] at [facility]

## 2020-01-02 DIAGNOSIS — Z992 Dependence on renal dialysis: Secondary | ICD-10-CM | POA: Diagnosis not present

## 2020-01-02 DIAGNOSIS — N186 End stage renal disease: Secondary | ICD-10-CM | POA: Diagnosis not present

## 2020-01-04 DIAGNOSIS — N186 End stage renal disease: Secondary | ICD-10-CM | POA: Diagnosis not present

## 2020-01-04 DIAGNOSIS — Z992 Dependence on renal dialysis: Secondary | ICD-10-CM | POA: Diagnosis not present

## 2020-01-05 ENCOUNTER — Other Ambulatory Visit
Admission: RE | Admit: 2020-01-05 | Discharge: 2020-01-05 | Disposition: A | Discharge status: Home / Self Care | Payer: Medicare HMO | Source: Ambulatory Visit | Attending: Vascular Surgery | Admitting: Vascular Surgery

## 2020-01-05 ENCOUNTER — Other Ambulatory Visit: Payer: Self-pay

## 2020-01-05 DIAGNOSIS — Z01812 Encounter for preprocedural laboratory examination: Secondary | ICD-10-CM | POA: Insufficient documentation

## 2020-01-05 DIAGNOSIS — Z20822 Contact with and (suspected) exposure to covid-19: Secondary | ICD-10-CM | POA: Diagnosis not present

## 2020-01-05 LAB — SARS CORONAVIRUS 2 (TAT 6-24 HRS): SARS Coronavirus 2: NEGATIVE

## 2020-01-06 ENCOUNTER — Other Ambulatory Visit (INDEPENDENT_AMBULATORY_CARE_PROVIDER_SITE_OTHER): Payer: Self-pay | Admitting: Nurse Practitioner

## 2020-01-06 DIAGNOSIS — N186 End stage renal disease: Secondary | ICD-10-CM | POA: Diagnosis not present

## 2020-01-06 DIAGNOSIS — Z992 Dependence on renal dialysis: Secondary | ICD-10-CM | POA: Diagnosis not present

## 2020-01-07 ENCOUNTER — Encounter
Admission: RE | Disposition: A | Discharge status: Home / Self Care | Payer: Self-pay | Source: Home / Self Care | Attending: Vascular Surgery

## 2020-01-07 ENCOUNTER — Ambulatory Visit
Admission: RE | Admit: 2020-01-07 | Discharge: 2020-01-07 | Disposition: A | Discharge status: Home / Self Care | Payer: Medicare HMO | Attending: Vascular Surgery | Admitting: Vascular Surgery

## 2020-01-07 ENCOUNTER — Other Ambulatory Visit: Payer: Self-pay

## 2020-01-07 DIAGNOSIS — Z7982 Long term (current) use of aspirin: Secondary | ICD-10-CM | POA: Diagnosis not present

## 2020-01-07 DIAGNOSIS — Y832 Surgical operation with anastomosis, bypass or graft as the cause of abnormal reaction of the patient, or of later complication, without mention of misadventure at the time of the procedure: Secondary | ICD-10-CM | POA: Insufficient documentation

## 2020-01-07 DIAGNOSIS — T82898A Other specified complication of vascular prosthetic devices, implants and grafts, initial encounter: Secondary | ICD-10-CM | POA: Diagnosis not present

## 2020-01-07 DIAGNOSIS — Z8614 Personal history of Methicillin resistant Staphylococcus aureus infection: Secondary | ICD-10-CM | POA: Diagnosis not present

## 2020-01-07 DIAGNOSIS — M1711 Unilateral primary osteoarthritis, right knee: Secondary | ICD-10-CM | POA: Diagnosis not present

## 2020-01-07 DIAGNOSIS — Z992 Dependence on renal dialysis: Secondary | ICD-10-CM | POA: Diagnosis not present

## 2020-01-07 DIAGNOSIS — Z886 Allergy status to analgesic agent status: Secondary | ICD-10-CM | POA: Diagnosis not present

## 2020-01-07 DIAGNOSIS — N186 End stage renal disease: Secondary | ICD-10-CM | POA: Diagnosis not present

## 2020-01-07 DIAGNOSIS — E1140 Type 2 diabetes mellitus with diabetic neuropathy, unspecified: Secondary | ICD-10-CM | POA: Insufficient documentation

## 2020-01-07 DIAGNOSIS — Z882 Allergy status to sulfonamides status: Secondary | ICD-10-CM | POA: Insufficient documentation

## 2020-01-07 DIAGNOSIS — Z96651 Presence of right artificial knee joint: Secondary | ICD-10-CM | POA: Diagnosis not present

## 2020-01-07 DIAGNOSIS — Z8249 Family history of ischemic heart disease and other diseases of the circulatory system: Secondary | ICD-10-CM | POA: Diagnosis not present

## 2020-01-07 DIAGNOSIS — Z79899 Other long term (current) drug therapy: Secondary | ICD-10-CM | POA: Diagnosis not present

## 2020-01-07 DIAGNOSIS — E1122 Type 2 diabetes mellitus with diabetic chronic kidney disease: Secondary | ICD-10-CM | POA: Insufficient documentation

## 2020-01-07 DIAGNOSIS — T82858A Stenosis of vascular prosthetic devices, implants and grafts, initial encounter: Secondary | ICD-10-CM | POA: Insufficient documentation

## 2020-01-07 DIAGNOSIS — Z96642 Presence of left artificial hip joint: Secondary | ICD-10-CM | POA: Diagnosis not present

## 2020-01-07 DIAGNOSIS — E7800 Pure hypercholesterolemia, unspecified: Secondary | ICD-10-CM | POA: Diagnosis not present

## 2020-01-07 DIAGNOSIS — I120 Hypertensive chronic kidney disease with stage 5 chronic kidney disease or end stage renal disease: Secondary | ICD-10-CM | POA: Insufficient documentation

## 2020-01-07 HISTORY — PX: A/V FISTULAGRAM: CATH118298

## 2020-01-07 LAB — POTASSIUM: Potassium: 4.6 mmol/L (ref 3.5–5.1)

## 2020-01-07 SURGERY — A/V FISTULAGRAM
Anesthesia: Moderate Sedation | Site: Arm Upper | Laterality: Left

## 2020-01-07 MED ORDER — HYDROCODONE-ACETAMINOPHEN 5-325 MG PO TABS: 1.0000 | ORAL_TABLET | Freq: Four times a day (QID) | ORAL | 0 refills | Status: DC | PRN

## 2020-01-07 MED ORDER — DIPHENHYDRAMINE HCL 50 MG/ML IJ SOLN: 50.0000 mg | Freq: Once | INTRAMUSCULAR | Status: DC | PRN

## 2020-01-07 MED ORDER — ACETAMINOPHEN 325 MG PO TABS
ORAL_TABLET | ORAL | Status: AC
  Administered 2020-01-07: 650 mg via ORAL
  Filled 2020-01-07: qty 2

## 2020-01-07 MED ORDER — FAMOTIDINE 20 MG PO TABS: 40.0000 mg | ORAL_TABLET | Freq: Once | ORAL | Status: DC | PRN

## 2020-01-07 MED ORDER — FENTANYL CITRATE (PF) 100 MCG/2ML IJ SOLN
INTRAMUSCULAR | Status: AC
  Filled 2020-01-07: qty 2

## 2020-01-07 MED ORDER — MIDAZOLAM HCL 2 MG/2ML IJ SOLN
INTRAMUSCULAR | Status: DC | PRN
  Administered 2020-01-07 (×3): 1 mg via INTRAVENOUS
  Administered 2020-01-07: 2 mg via INTRAVENOUS

## 2020-01-07 MED ORDER — IODIXANOL 320 MG/ML IV SOLN
INTRAVENOUS | Status: DC | PRN
  Administered 2020-01-07: 25 mL

## 2020-01-07 MED ORDER — HEPARIN SODIUM (PORCINE) 1000 UNIT/ML IJ SOLN
INTRAMUSCULAR | Status: AC
  Filled 2020-01-07: qty 1

## 2020-01-07 MED ORDER — ACETAMINOPHEN 325 MG PO TABS: 650.0000 mg | ORAL_TABLET | Freq: Four times a day (QID) | ORAL | Status: AC | PRN

## 2020-01-07 MED ORDER — HYDROMORPHONE HCL 1 MG/ML IJ SOLN: 1.0000 mg | Freq: Once | INTRAMUSCULAR | Status: DC | PRN

## 2020-01-07 MED ORDER — FENTANYL CITRATE (PF) 100 MCG/2ML IJ SOLN
INTRAMUSCULAR | Status: DC | PRN
  Administered 2020-01-07: 25 ug via INTRAVENOUS
  Administered 2020-01-07 (×3): 50 ug via INTRAVENOUS

## 2020-01-07 MED ORDER — MIDAZOLAM HCL 2 MG/ML PO SYRP: 8.0000 mg | ORAL_SOLUTION | Freq: Once | ORAL | Status: DC | PRN

## 2020-01-07 MED ORDER — HEPARIN SODIUM (PORCINE) 1000 UNIT/ML IJ SOLN
INTRAMUSCULAR | Status: DC | PRN
  Administered 2020-01-07: 4000 [IU] via INTRAVENOUS

## 2020-01-07 MED ORDER — MIDAZOLAM HCL 5 MG/5ML IJ SOLN
INTRAMUSCULAR | Status: AC
  Filled 2020-01-07: qty 5

## 2020-01-07 MED ORDER — CEFAZOLIN SODIUM-DEXTROSE 1-4 GM/50ML-% IV SOLN
1.0000 g | Freq: Once | INTRAVENOUS | Status: AC
  Administered 2020-01-07: 1 g via INTRAVENOUS

## 2020-01-07 MED ORDER — METHYLPREDNISOLONE SODIUM SUCC 125 MG IJ SOLR: 125.0000 mg | Freq: Once | INTRAMUSCULAR | Status: DC | PRN

## 2020-01-07 MED ORDER — SODIUM CHLORIDE 0.9 % IV SOLN: INTRAVENOUS | Status: DC

## 2020-01-07 MED ORDER — ONDANSETRON HCL 4 MG/2ML IJ SOLN: 4.0000 mg | Freq: Four times a day (QID) | INTRAMUSCULAR | Status: DC | PRN

## 2020-01-07 SURGICAL SUPPLY — 21 items
BALLN DORADO 5X40X80 (BALLOONS) ×3
BALLN DORADO 8X40X80 (BALLOONS) ×3
BALLOON DORADO 5X40X80 (BALLOONS) IMPLANT
BALLOON DORADO 8X40X80 (BALLOONS) IMPLANT
CATH BEACON 5 .035 40 KMP TP (CATHETERS) IMPLANT
CATH BEACON 5 .038 40 KMP TP (CATHETERS) ×3
DEVICE PRESTO INFLATION (MISCELLANEOUS) ×2 IMPLANT
DRAPE BRACHIAL (DRAPES) ×2 IMPLANT
NDL ENTRY 21GA 7CM ECHOTIP (NEEDLE) IMPLANT
NEEDLE ENTRY 21GA 7CM ECHOTIP (NEEDLE) ×3 IMPLANT
PACK ANGIOGRAPHY (CUSTOM PROCEDURE TRAY) ×3 IMPLANT
SET INTRO CAPELLA COAXIAL (SET/KITS/TRAYS/PACK) ×2 IMPLANT
SHEATH BRITE TIP 6FRX5.5 (SHEATH) ×2 IMPLANT
SHEATH BRITE TIP 7FRX5.5 (SHEATH) ×2 IMPLANT
STENT VIABAHN 8X50X120 (Permanent Stent) ×2 IMPLANT
STENT VIABAHN5X120X8X (Permanent Stent) ×1 IMPLANT
SUT MNCRL 4-0 (SUTURE) ×6
SUT MNCRL 4-0 27XMFL (SUTURE) ×2
SUTURE MNCRL 4-0 27XMF (SUTURE) IMPLANT
WIRE G 018X200 V18 (WIRE) ×2 IMPLANT
WIRE MAGIC TOR.035 180C (WIRE) ×2 IMPLANT

## 2020-01-07 NOTE — Interval H&P Note (Signed)
History and Physical Interval Note:  01/07/2020 12:22 PM  Carrie [name]  has presented today for surgery, with the diagnosis of LT upper extremity fistulagram   End Stage Renal  Covid Aug 3.  The various methods of treatment have been discussed with the patient and family. After consideration of risks, benefits and other options for treatment, the patient has consented to  Procedure(s): A/V FISTULAGRAM (Left) as a surgical intervention.  The patient's history has been reviewed, patient examined, no change in status, stable for surgery.  I have reviewed the patient's chart and labs.  Questions were answered to the patient's satisfaction.     [name]

## 2020-01-07 NOTE — Op Note (Signed)
OPERATIVE NOTE   PROCEDURE: 1. Contrast injection left brachial axillary AV access 2. Percutaneous transluminal angioplasty and stent placement peripheral segment left brachial axillary AV graft  PRE-OPERATIVE DIAGNOSIS: Complication of dialysis access                                                       End Stage Renal Disease  POST-OPERATIVE DIAGNOSIS: same as above   SURGEON: Katha Cabal, M.D.  ANESTHESIA: Conscious sedation was administered under my direct supervision by the interventional radiology RN. IV Versed plus fentanyl were utilized. Continuous ECG, pulse oximetry and blood pressure was monitored throughout the entire procedure.  Conscious sedation was for a total of 30 minutes.  ESTIMATED BLOOD LOSS: minimal  FINDING(S): 1. Greater than 90% stenosis in the midportion of the AV graft associated with a greater than 80% stenosis at the arterial anastomosis  SPECIMEN(S):  None  CONTRAST: 25 cc  FLUOROSCOPY TIME: 2.1 minutes  INDICATIONS: Carrie [name] is a 69 y.o. female who  presents with malfunctioning left arm AV access.  The patient is scheduled for angiography with possible intervention of the AV access.  The patient is aware the risks include but are not limited to: bleeding, infection, thrombosis of the cannulated access, and possible anaphylactic reaction to the contrast.  The patient acknowledges if the access can not be salvaged a tunneled catheter will be needed and will be placed during this procedure.  The patient is aware of the risks of the procedure and elects to proceed with the angiogram and intervention.  DESCRIPTION: After full informed written consent was obtained, the patient was brought back to the Special Procedure suite and placed supine position.  Appropriate cardiopulmonary monitors were placed.  The left arm was prepped and draped in the standard fashion.  Appropriate timeout is called. The left brachial axillary AV graft was cannulated  with a micropuncture needle.  Cannulation was performed with ultrasound guidance. Ultrasound was placed in a sterile sleeve, the AV access was interrogated and noted to be echolucent and compressible indicating patency. Image was recorded for the permanent record. The puncture is performed under continuous ultrasound visualization.   The microwire was advanced and the needle was exchanged for  a microsheath.  The J-wire was then advanced and a 6 Fr sheath inserted.  Hand injections were completed to image the access from the arterial anastomosis through the entire access.  The central venous structures were also imaged by hand injections.  Based on the images,  3000 units of heparin was given and a wire was negotiated through the strictures within the venous portion of the graft.  An 8 mm x 50 mm Viabahn was deployed across the stenoses and postdilated with an 8 mm Dorado balloon.  Follow-up imaging demonstrated less than 10% residual stenosis.  With the balloon inflated reflux imaging was obtained which demonstrated a critical lesion down by the arterial anastomosis.  Follow-up imaging demonstrates complete resolution of the stricture with rapid flow of contrast through the graft, the central venous anatomy is preserved.  A 4-0 Monocryl purse-string suture was sewn around the sheath.  The sheath was removed and light pressure was applied.  The ultrasound was reprepped and redraped and imaging of the AV graft was performed more proximally on the arm at the level of the deltoid.  A site was selected and 1% lidocaine was infiltrated in the soft tissues.  Micropuncture was inserted under direct ultrasound visualization after an image was taken for the permanent record.  Microwire followed microsheath 6 [place] sheath.  A Magic torque wire was then advanced in a retrograde fashion out into the brachial artery.  A 5 mm x 40 mm Dorado balloon was then advanced across the stricture into the arterial anastomosis and  inflated to 16 atm for 1 minute.  Balloon was removed and a Kumpe catheter advanced over the wire.  Kumpe catheter was positioned with its tip within the brachial artery at the level of the anastomosis and hand-injection contrast demonstrated less than 10% residual stenosis.  Having successfully treated these tandem lesions I elected to terminate the procedure.  Pursestring suture of 4-0 Monocryl was placed around the second sheath sheath was removed light pressure was held A sterile bandage was applied to the puncture sites.    COMPLICATIONS: None  CONDITION: [name], M.D [facility] Office: [phone]  01/07/2020 1:15 PM

## 2020-01-08 DIAGNOSIS — N186 End stage renal disease: Secondary | ICD-10-CM | POA: Diagnosis not present

## 2020-01-08 DIAGNOSIS — Z992 Dependence on renal dialysis: Secondary | ICD-10-CM | POA: Diagnosis not present

## 2020-01-11 ENCOUNTER — Encounter: Payer: Self-pay | Admitting: Vascular Surgery

## 2020-01-11 DIAGNOSIS — N186 End stage renal disease: Secondary | ICD-10-CM | POA: Diagnosis not present

## 2020-01-11 DIAGNOSIS — Z992 Dependence on renal dialysis: Secondary | ICD-10-CM | POA: Diagnosis not present

## 2020-01-13 DIAGNOSIS — N186 End stage renal disease: Secondary | ICD-10-CM | POA: Diagnosis not present

## 2020-01-13 DIAGNOSIS — Z992 Dependence on renal dialysis: Secondary | ICD-10-CM | POA: Diagnosis not present

## 2020-01-15 DIAGNOSIS — N186 End stage renal disease: Secondary | ICD-10-CM | POA: Diagnosis not present

## 2020-01-15 DIAGNOSIS — Z992 Dependence on renal dialysis: Secondary | ICD-10-CM | POA: Diagnosis not present

## 2020-01-18 DIAGNOSIS — Z992 Dependence on renal dialysis: Secondary | ICD-10-CM | POA: Diagnosis not present

## 2020-01-18 DIAGNOSIS — N186 End stage renal disease: Secondary | ICD-10-CM | POA: Diagnosis not present

## 2020-01-20 DIAGNOSIS — N186 End stage renal disease: Secondary | ICD-10-CM | POA: Diagnosis not present

## 2020-01-20 DIAGNOSIS — Z992 Dependence on renal dialysis: Secondary | ICD-10-CM | POA: Diagnosis not present

## 2020-01-22 DIAGNOSIS — N186 End stage renal disease: Secondary | ICD-10-CM | POA: Diagnosis not present

## 2020-01-22 DIAGNOSIS — Z992 Dependence on renal dialysis: Secondary | ICD-10-CM | POA: Diagnosis not present

## 2020-01-25 DIAGNOSIS — N186 End stage renal disease: Secondary | ICD-10-CM | POA: Diagnosis not present

## 2020-01-25 DIAGNOSIS — Z992 Dependence on renal dialysis: Secondary | ICD-10-CM | POA: Diagnosis not present

## 2020-01-27 DIAGNOSIS — Z992 Dependence on renal dialysis: Secondary | ICD-10-CM | POA: Diagnosis not present

## 2020-01-27 DIAGNOSIS — N186 End stage renal disease: Secondary | ICD-10-CM | POA: Diagnosis not present

## 2020-01-28 ENCOUNTER — Encounter (INDEPENDENT_AMBULATORY_CARE_PROVIDER_SITE_OTHER): Payer: Medicare HMO

## 2020-01-28 ENCOUNTER — Ambulatory Visit (INDEPENDENT_AMBULATORY_CARE_PROVIDER_SITE_OTHER): Payer: Medicare HMO | Admitting: Vascular Surgery

## 2020-01-29 DIAGNOSIS — Z992 Dependence on renal dialysis: Secondary | ICD-10-CM | POA: Diagnosis not present

## 2020-01-29 DIAGNOSIS — N186 End stage renal disease: Secondary | ICD-10-CM | POA: Diagnosis not present

## 2020-01-29 LAB — HEMOGLOBIN A1C: Hemoglobin A1C: 5.7

## 2020-02-01 DIAGNOSIS — Z992 Dependence on renal dialysis: Secondary | ICD-10-CM | POA: Diagnosis not present

## 2020-02-01 DIAGNOSIS — N186 End stage renal disease: Secondary | ICD-10-CM | POA: Diagnosis not present

## 2020-02-02 ENCOUNTER — Ambulatory Visit (INDEPENDENT_AMBULATORY_CARE_PROVIDER_SITE_OTHER): Payer: Medicare HMO | Admitting: Internal Medicine

## 2020-02-02 ENCOUNTER — Other Ambulatory Visit: Payer: Self-pay | Admitting: Internal Medicine

## 2020-02-02 ENCOUNTER — Encounter: Payer: Self-pay | Admitting: Internal Medicine

## 2020-02-02 ENCOUNTER — Other Ambulatory Visit: Payer: Self-pay

## 2020-02-02 DIAGNOSIS — Z6841 Body Mass Index (BMI) 40.0 and over, adult: Secondary | ICD-10-CM | POA: Diagnosis not present

## 2020-02-02 DIAGNOSIS — N186 End stage renal disease: Secondary | ICD-10-CM

## 2020-02-02 DIAGNOSIS — Z992 Dependence on renal dialysis: Secondary | ICD-10-CM | POA: Diagnosis not present

## 2020-02-02 DIAGNOSIS — I5032 Chronic diastolic (congestive) heart failure: Secondary | ICD-10-CM | POA: Diagnosis not present

## 2020-02-02 DIAGNOSIS — I10 Essential (primary) hypertension: Secondary | ICD-10-CM

## 2020-02-02 DIAGNOSIS — Z0000 Encounter for general adult medical examination without abnormal findings: Secondary | ICD-10-CM | POA: Diagnosis not present

## 2020-02-02 DIAGNOSIS — Z1231 Encounter for screening mammogram for malignant neoplasm of breast: Secondary | ICD-10-CM

## 2020-02-02 DIAGNOSIS — E1121 Type 2 diabetes mellitus with diabetic nephropathy: Secondary | ICD-10-CM

## 2020-02-02 DIAGNOSIS — F39 Unspecified mood [affective] disorder: Secondary | ICD-10-CM

## 2020-02-02 LAB — HM DIABETES FOOT EXAM

## 2020-02-02 MED ORDER — LORAZEPAM 0.5 MG PO TABS: 0.5000 mg | ORAL_TABLET | Freq: Two times a day (BID) | ORAL | 0 refills | Status: DC | PRN

## 2020-02-02 NOTE — Assessment & Plan Note (Signed)
Up some Working to get BMI under 40 for transplant list

## 2020-02-02 NOTE — Assessment & Plan Note (Signed)
I have personally reviewed the Medicare Annual Wellness questionnaire and have noted 1. The patient's medical and social history 2. Their use of alcohol, tobacco or illicit drugs 3. Their current medications and supplements 4. The patient's functional ability including ADL's, fall risks, home safety risks and hearing or visual             impairment. 5. Diet and physical activities 6. Evidence for depression or mood disorders  The patients weight, height, BMI and visual acuity have been recorded in the chart I have made referrals, counseling and provided education to the patient based review of the above and I have provided the pt with a written personalized care plan for preventive services.  I have provided you with a copy of your personalized plan for preventive services. Please take the time to review along with your updated medication list.  Will need COVID booster and flu vaccine this fall Due for mammogram---she will set this up Will probably need colon before considered for transplant--she will work with [facility] about this Trying to exercise Consider shingrix--but may be too much money

## 2020-02-02 NOTE — Assessment & Plan Note (Signed)
Still with family stress Will refill the lorazepam for prn use

## 2020-02-02 NOTE — Assessment & Plan Note (Signed)
Compensated Fluid managed at dialysis

## 2020-02-02 NOTE — Progress Notes (Signed)
Subjective:    Patient ID: Carrie [name], female    DOB: 12/02/1950, 69 y.o.   MRN: [id number]  HPI Here for Medicare wellness visit and follow up of chronic health conditions This visit occurred during the SARS-CoV-2 public health emergency.  Safety protocols were in place, including screening questions prior to the visit, additional usage of staff PPE, and extensive cleaning of exam room while observing appropriate contact time as indicated for disinfecting solutions.   Reviewed form and advanced directives Reviewed other doctors No alcohol or tobacco Tries to exercise a little--but hard with the heat Vision is fine--early cataract Hearing is good No falls Ongoing mood issues Independent with instrumental ADLs No sig memory issues  Ongoing family stress 9 year old great nephew died---leukemia Brings back stress of losing daughter Uses the lorazepam for especially stressful times Not anhedonic though  Ongoing dialysis Taken off dialysis list--due to weight BMI over 40 again---hoping to get back down again and get on the list again Working with dietician  Not checking sugars Has been controlled without medication No foot numbness or pain  No chest pain Breathing is good--some dyspnea in the heat with activity No edema No dizziness or syncope  Continues on keppra Told to continue indefinitely after meningioma surgery  Current Outpatient Medications on File Prior to Visit  Medication Sig Dispense Refill  . acetaminophen (TYLENOL) 500 MG tablet Take 1,000 mg by mouth every 6 (six) hours as needed (for pain.).     [name] aspirin EC 81 MG tablet Take 81 mg by mouth daily.    [name] atorvastatin (LIPITOR) 40 MG tablet TAKE 40 MG BY MOUTH ONCE DAILY (Patient taking differently: Take 40 mg by mouth daily. ) 90 tablet 3  . levETIRAcetam (KEPPRA) 1000 MG tablet TAKE 1 TABLET BY MOUTH TWICE A DAY (Patient taking differently: Take 1,000 mg by mouth 2 (two) times daily. ) 180 tablet 3    . lidocaine-prilocaine (EMLA) cream Apply 1 application topically every Monday, Wednesday, and Friday with hemodialysis.    [name] LORazepam (ATIVAN) 0.5 MG tablet TAKE 1 TABLET (0.5 MG TOTAL) BY MOUTH 2 (TWO) TIMES DAILY AS NEEDED FOR ANXIETY. 60 tablet 0  . losartan (COZAAR) 25 MG tablet Take 25 mg by mouth every evening.     . metoprolol tartrate (LOPRESSOR) 25 MG tablet Take 25 mg by mouth every evening.     . multivitamin (RENA-VIT) TABS tablet Take 1 tablet by mouth at bedtime. 30 tablet 0  . sevelamer carbonate (RENVELA) 800 MG tablet Take 800-1,600 mg by mouth See admin instructions. Take 2 tablets (1600 mg) by mouth with each meal & 1 tablet (800 mg) by mouth with each snack    . torsemide (DEMADEX) 20 MG tablet Take 20 mg by mouth 4 (four) times a week. Take 1 tablet (20 mg) by mouth in the morning on Tuesdays, Thursdays, Saturdays, & Sundays in the morning on non-dialysis days.     No current facility-administered medications on file prior to visit.    Allergies  Allergen Reactions  . Naproxen Sodium Anaphylaxis and Swelling  . Sulfamethoxazole-Trimethoprim Anaphylaxis and Swelling    Past Medical History:  Diagnosis Date  . Allergy   . Anemia   . Arthritis    "right knee" (10/26'/2017)  . CKD (chronic kidney disease), stage III    12/2015 (Dr. Lavonia Dana)  . Depression   . History of blood transfusion 2008   "when I had brain tumor surgery"  . History of  MRSA infection 2008  . Hypertension   . 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 Type II diabetes mellitus ([place]) 2011   on metformin until yr ago- no med now - off due to kidneys    Past Surgical History:  Procedure Laterality Date  . A/V FISTULAGRAM Left 03/10/2019   Procedure: A/V FISTULAGRAM;  Surgeon: Katha Cabal, MD;  Location: [place] [facility];  Service: Cardiovascular;  Laterality: Left;  . A/V FISTULAGRAM Left 07/14/2019   Procedure: A/V FISTULAGRAM;  Surgeon: Katha Cabal, MD;  Location: [place] [facility];  Service: Cardiovascular;  Laterality: Left;  . A/V FISTULAGRAM Left 01/07/2020   Procedure: A/V FISTULAGRAM;  Surgeon: Katha Cabal, MD;  Location: [place] [facility];  Service: Cardiovascular;  Laterality: Left;  . AV FISTULA PLACEMENT Left 11/07/2018   Procedure: ARTERIOVENOUS (AV) FISTULA CREATION ( BRACHIO BASILIC ) VS BRACHIAL AXILLARY GRAFT;  Surgeon: Katha Cabal, MD;  Location: [facility];  Service: Vascular;  Laterality: Left;  . BRAIN MENINGIOMA EXCISION  08/2006   Foramina magnum meningioma  . CERVICAL DISCECTOMY  1996   Dr. Alric Seton  . CESAREAN SECTION  1981  . COLONOSCOPY    . DIALYSIS/PERMA CATHETER INSERTION N/A 08/04/2018   Procedure: DIALYSIS/PERMA CATHETER INSERTION;  Surgeon: Algernon Huxley, MD;  Location: [place] [facility];  Service: Cardiovascular;  Laterality: N/A;  . DIALYSIS/PERMA CATHETER REMOVAL N/A 12/25/2018   Procedure: DIALYSIS/PERMA CATHETER REMOVAL;  Surgeon: Algernon Huxley, MD;  Location: [place] [facility];  Service: Cardiovascular;  Laterality: N/A;  . DILATION AND CURETTAGE OF UTERUS    . JOINT REPLACEMENT     right knee left hip  . Right wrist x-ray  2007   Deg. at 1st MCP  . TOTAL HIP ARTHROPLASTY Left 03/27/2016   Procedure: LEFT TOTAL HIP ARTHROPLASTY ANTERIOR APPROACH;  Surgeon: Mcarthur Rossetti, MD;  Location: [place];  Service: Orthopedics;  Laterality: Left;  . TOTAL KNEE ARTHROPLASTY Right 04/30/2017   Procedure: RIGHT TOTAL KNEE ARTHROPLASTY;  Surgeon: Mcarthur Rossetti, MD;  Location: [place];  Service: Orthopedics;  Laterality: Right;  . TUBAL LIGATION  1981    Family History  Problem Relation Age of Onset  . Hypertension Other        Strong family history   . Breast cancer Mother 29  . Heart failure Sister   . Heart disease Sister     Social History   Socioeconomic History  . Marital status: Widowed    Spouse name: Not on file  . Number of children: 1  . Years of education: Not on file  .  Highest education level: Not on file  Occupational History  . 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 Occupation: Disabled since brain surgery  Tobacco Use  . Smoking status: Never Smoker  . Smokeless tobacco: Never Used  Vaping Use  . Vaping Use: Never used  Substance and Sexual Activity  . Alcohol use: No    Alcohol/week: 0.0 standard drinks  . Drug use: No  . Sexual activity: Never  Other Topics Concern  . Not on file  Social History Narrative   Lives in family home with extended family.      No living will   Requests niece, [name], as health care POA   Would accept resuscitation attempts   No tube feeds if cognitively aware   Social Determinants of Health   Financial Resource Strain:   . Difficulty of  Paying Living Expenses: Not on file  Food Insecurity:   . Worried About Charity fundraiser in the Last Year: Not on file  . Ran Out of Food in the Last Year: Not on file  Transportation Needs:   . Lack of Transportation (Medical): Not on file  . Lack of Transportation (Non-Medical): Not on file  Physical Activity:   . Days of Exercise per Week: Not on file  . Minutes of Exercise per Session: Not on file  Stress:   . Feeling of Stress : Not on file  Social Connections:   . Frequency of Communication with Friends and Family: Not on file  . Frequency of Social Gatherings with Friends and Family: Not on file  . Attends Religious Services: Not on file  . Active Member of Clubs or Organizations: Not on file  . Attends Archivist Meetings: Not on file  . Marital Status: Not on file  Intimate Partner Violence:   . Fear of Current or Ex-Partner: Not on file  . Emotionally Abused: Not on file  . Physically Abused: Not on file  . Sexually Abused: Not on file   Review of Systems Appetite is good No sig back or joint pains Wears seat belt Overdue for dentist No skin rash or lesions Bowels are fine---no blood No dysuria or hematuria. No  incontinence No heartburn or dysphagia    Objective:   Physical Exam HENT:     Mouth/Throat:     Comments: No oral lesions Eyes:     Conjunctiva/sclera: Conjunctivae normal.     Pupils: Pupils are equal, round, and reactive to light.  Cardiovascular:     Rate and Rhythm: Normal rate and regular rhythm.     Pulses: Normal pulses.     Heart sounds: No murmur heard.  No gallop.   Pulmonary:     Effort: Pulmonary effort is normal.     Breath sounds: Normal breath sounds. No wheezing or rales.  Abdominal:     Palpations: Abdomen is soft.     Tenderness: There is no abdominal tenderness.  Musculoskeletal:     Cervical back: Neck supple.     Right lower leg: No edema.     Left lower leg: No edema.  Lymphadenopathy:     Cervical: No cervical adenopathy.  Skin:    General: Skin is warm.     Findings: No rash.     Comments: No foot lesions  Neurological:     Mental Status: She is alert and oriented to person, place, and time.     Comments: President--- "Edmon Crape, Trump, Obama" [phone] [id number] Recall 3/3  Normal sensation in feet  Psychiatric:        Mood and Affect: Mood normal.        Behavior: Behavior normal.            Assessment & Plan:

## 2020-02-02 NOTE — Assessment & Plan Note (Signed)
On dialysis--doing okay Hopes to get back on transplant list

## 2020-02-02 NOTE — Assessment & Plan Note (Signed)
Lab Results  Component Value Date   HGBA1C 5.7% 01/29/2020   No meds Doing well

## 2020-02-02 NOTE — Progress Notes (Signed)
Hearing Screening   125Hz  250Hz  500Hz  1000Hz  2000Hz  3000Hz  4000Hz  6000Hz  8000Hz   Right ear:   40  40 40 40    Left ear:   40  40 40 40    Vision Screening Comments: Last vision exam was August 2021

## 2020-02-02 NOTE — Assessment & Plan Note (Signed)
BP Readings from Last 3 Encounters:  02/02/20 116/70  01/07/20 137/85  12/29/19 (!) 151/81   Fine on losartan

## 2020-02-03 DIAGNOSIS — Z992 Dependence on renal dialysis: Secondary | ICD-10-CM | POA: Diagnosis not present

## 2020-02-03 DIAGNOSIS — N186 End stage renal disease: Secondary | ICD-10-CM | POA: Diagnosis not present

## 2020-02-05 DIAGNOSIS — N186 End stage renal disease: Secondary | ICD-10-CM | POA: Diagnosis not present

## 2020-02-05 DIAGNOSIS — Z992 Dependence on renal dialysis: Secondary | ICD-10-CM | POA: Diagnosis not present

## 2020-02-08 DIAGNOSIS — N186 End stage renal disease: Secondary | ICD-10-CM | POA: Diagnosis not present

## 2020-02-08 DIAGNOSIS — Z992 Dependence on renal dialysis: Secondary | ICD-10-CM | POA: Diagnosis not present

## 2020-02-10 DIAGNOSIS — Z992 Dependence on renal dialysis: Secondary | ICD-10-CM | POA: Diagnosis not present

## 2020-02-10 DIAGNOSIS — N186 End stage renal disease: Secondary | ICD-10-CM | POA: Diagnosis not present

## 2020-02-12 DIAGNOSIS — N186 End stage renal disease: Secondary | ICD-10-CM | POA: Diagnosis not present

## 2020-02-12 DIAGNOSIS — Z992 Dependence on renal dialysis: Secondary | ICD-10-CM | POA: Diagnosis not present

## 2020-02-15 DIAGNOSIS — N186 End stage renal disease: Secondary | ICD-10-CM | POA: Diagnosis not present

## 2020-02-15 DIAGNOSIS — Z992 Dependence on renal dialysis: Secondary | ICD-10-CM | POA: Diagnosis not present

## 2020-02-17 DIAGNOSIS — N186 End stage renal disease: Secondary | ICD-10-CM | POA: Diagnosis not present

## 2020-02-17 DIAGNOSIS — Z992 Dependence on renal dialysis: Secondary | ICD-10-CM | POA: Diagnosis not present

## 2020-02-19 DIAGNOSIS — N186 End stage renal disease: Secondary | ICD-10-CM | POA: Diagnosis not present

## 2020-02-19 DIAGNOSIS — Z992 Dependence on renal dialysis: Secondary | ICD-10-CM | POA: Diagnosis not present

## 2020-02-22 DIAGNOSIS — N186 End stage renal disease: Secondary | ICD-10-CM | POA: Diagnosis not present

## 2020-02-22 DIAGNOSIS — Z992 Dependence on renal dialysis: Secondary | ICD-10-CM | POA: Diagnosis not present

## 2020-02-23 ENCOUNTER — Other Ambulatory Visit: Payer: Self-pay | Admitting: Internal Medicine

## 2020-02-26 DIAGNOSIS — N186 End stage renal disease: Secondary | ICD-10-CM | POA: Diagnosis not present

## 2020-02-26 DIAGNOSIS — Z992 Dependence on renal dialysis: Secondary | ICD-10-CM | POA: Diagnosis not present

## 2020-02-29 DIAGNOSIS — Z992 Dependence on renal dialysis: Secondary | ICD-10-CM | POA: Diagnosis not present

## 2020-02-29 DIAGNOSIS — N186 End stage renal disease: Secondary | ICD-10-CM | POA: Diagnosis not present

## 2020-03-03 DIAGNOSIS — N186 End stage renal disease: Secondary | ICD-10-CM | POA: Diagnosis not present

## 2020-03-03 DIAGNOSIS — Z992 Dependence on renal dialysis: Secondary | ICD-10-CM | POA: Diagnosis not present

## 2020-03-04 DIAGNOSIS — N186 End stage renal disease: Secondary | ICD-10-CM | POA: Diagnosis not present

## 2020-03-04 DIAGNOSIS — Z992 Dependence on renal dialysis: Secondary | ICD-10-CM | POA: Diagnosis not present

## 2020-03-07 DIAGNOSIS — Z992 Dependence on renal dialysis: Secondary | ICD-10-CM | POA: Diagnosis not present

## 2020-03-07 DIAGNOSIS — N186 End stage renal disease: Secondary | ICD-10-CM | POA: Diagnosis not present

## 2020-03-08 ENCOUNTER — Other Ambulatory Visit: Payer: Self-pay

## 2020-03-08 ENCOUNTER — Ambulatory Visit
Admission: RE | Admit: 2020-03-08 | Discharge: 2020-03-08 | Disposition: A | Discharge status: Home / Self Care | Payer: Medicare HMO | Source: Ambulatory Visit | Attending: Internal Medicine | Admitting: Internal Medicine

## 2020-03-08 DIAGNOSIS — Z1231 Encounter for screening mammogram for malignant neoplasm of breast: Secondary | ICD-10-CM | POA: Insufficient documentation

## 2020-03-09 DIAGNOSIS — N186 End stage renal disease: Secondary | ICD-10-CM | POA: Diagnosis not present

## 2020-03-09 DIAGNOSIS — Z992 Dependence on renal dialysis: Secondary | ICD-10-CM | POA: Diagnosis not present

## 2020-03-11 DIAGNOSIS — Z992 Dependence on renal dialysis: Secondary | ICD-10-CM | POA: Diagnosis not present

## 2020-03-11 DIAGNOSIS — N186 End stage renal disease: Secondary | ICD-10-CM | POA: Diagnosis not present

## 2020-03-14 DIAGNOSIS — N186 End stage renal disease: Secondary | ICD-10-CM | POA: Diagnosis not present

## 2020-03-14 DIAGNOSIS — Z992 Dependence on renal dialysis: Secondary | ICD-10-CM | POA: Diagnosis not present

## 2020-03-14 DIAGNOSIS — E119 Type 2 diabetes mellitus without complications: Secondary | ICD-10-CM | POA: Diagnosis not present

## 2020-03-16 DIAGNOSIS — N186 End stage renal disease: Secondary | ICD-10-CM | POA: Diagnosis not present

## 2020-03-16 DIAGNOSIS — Z992 Dependence on renal dialysis: Secondary | ICD-10-CM | POA: Diagnosis not present

## 2020-03-18 DIAGNOSIS — Z992 Dependence on renal dialysis: Secondary | ICD-10-CM | POA: Diagnosis not present

## 2020-03-18 DIAGNOSIS — N186 End stage renal disease: Secondary | ICD-10-CM | POA: Diagnosis not present

## 2020-03-21 DIAGNOSIS — N186 End stage renal disease: Secondary | ICD-10-CM | POA: Diagnosis not present

## 2020-03-21 DIAGNOSIS — Z992 Dependence on renal dialysis: Secondary | ICD-10-CM | POA: Diagnosis not present

## 2020-03-22 DIAGNOSIS — H00011 Hordeolum externum right upper eyelid: Secondary | ICD-10-CM | POA: Diagnosis not present

## 2020-03-23 DIAGNOSIS — Z992 Dependence on renal dialysis: Secondary | ICD-10-CM | POA: Diagnosis not present

## 2020-03-23 DIAGNOSIS — N186 End stage renal disease: Secondary | ICD-10-CM | POA: Diagnosis not present

## 2020-03-25 DIAGNOSIS — N186 End stage renal disease: Secondary | ICD-10-CM | POA: Diagnosis not present

## 2020-03-25 DIAGNOSIS — Z992 Dependence on renal dialysis: Secondary | ICD-10-CM | POA: Diagnosis not present

## 2020-03-28 DIAGNOSIS — Z992 Dependence on renal dialysis: Secondary | ICD-10-CM | POA: Diagnosis not present

## 2020-03-28 DIAGNOSIS — N186 End stage renal disease: Secondary | ICD-10-CM | POA: Diagnosis not present

## 2020-03-28 DIAGNOSIS — E119 Type 2 diabetes mellitus without complications: Secondary | ICD-10-CM | POA: Diagnosis not present

## 2020-03-29 DIAGNOSIS — H00011 Hordeolum externum right upper eyelid: Secondary | ICD-10-CM | POA: Diagnosis not present

## 2020-03-30 DIAGNOSIS — N186 End stage renal disease: Secondary | ICD-10-CM | POA: Diagnosis not present

## 2020-03-30 DIAGNOSIS — Z992 Dependence on renal dialysis: Secondary | ICD-10-CM | POA: Diagnosis not present

## 2020-04-01 DIAGNOSIS — Z992 Dependence on renal dialysis: Secondary | ICD-10-CM | POA: Diagnosis not present

## 2020-04-01 DIAGNOSIS — N186 End stage renal disease: Secondary | ICD-10-CM | POA: Diagnosis not present

## 2020-04-03 DIAGNOSIS — Z992 Dependence on renal dialysis: Secondary | ICD-10-CM | POA: Diagnosis not present

## 2020-04-03 DIAGNOSIS — N186 End stage renal disease: Secondary | ICD-10-CM | POA: Diagnosis not present

## 2020-04-04 DIAGNOSIS — N186 End stage renal disease: Secondary | ICD-10-CM | POA: Diagnosis not present

## 2020-04-04 DIAGNOSIS — Z992 Dependence on renal dialysis: Secondary | ICD-10-CM | POA: Diagnosis not present

## 2020-04-06 DIAGNOSIS — N186 End stage renal disease: Secondary | ICD-10-CM | POA: Diagnosis not present

## 2020-04-06 DIAGNOSIS — Z992 Dependence on renal dialysis: Secondary | ICD-10-CM | POA: Diagnosis not present

## 2020-04-08 DIAGNOSIS — Z992 Dependence on renal dialysis: Secondary | ICD-10-CM | POA: Diagnosis not present

## 2020-04-08 DIAGNOSIS — N186 End stage renal disease: Secondary | ICD-10-CM | POA: Diagnosis not present

## 2020-04-11 DIAGNOSIS — N186 End stage renal disease: Secondary | ICD-10-CM | POA: Diagnosis not present

## 2020-04-11 DIAGNOSIS — Z992 Dependence on renal dialysis: Secondary | ICD-10-CM | POA: Diagnosis not present

## 2020-04-12 DIAGNOSIS — H00011 Hordeolum externum right upper eyelid: Secondary | ICD-10-CM | POA: Diagnosis not present

## 2020-04-13 ENCOUNTER — Other Ambulatory Visit: Payer: Self-pay | Admitting: Internal Medicine

## 2020-04-13 DIAGNOSIS — N186 End stage renal disease: Secondary | ICD-10-CM | POA: Diagnosis not present

## 2020-04-13 DIAGNOSIS — Z992 Dependence on renal dialysis: Secondary | ICD-10-CM | POA: Diagnosis not present

## 2020-04-13 NOTE — Telephone Encounter (Signed)
Last filled 02-02-20 #60 Last OV 02-02-20 Next OV 02-07-21 [facility]

## 2020-04-15 DIAGNOSIS — N186 End stage renal disease: Secondary | ICD-10-CM | POA: Diagnosis not present

## 2020-04-15 DIAGNOSIS — Z992 Dependence on renal dialysis: Secondary | ICD-10-CM | POA: Diagnosis not present

## 2020-04-18 DIAGNOSIS — N186 End stage renal disease: Secondary | ICD-10-CM | POA: Diagnosis not present

## 2020-04-18 DIAGNOSIS — Z992 Dependence on renal dialysis: Secondary | ICD-10-CM | POA: Diagnosis not present

## 2020-04-22 DIAGNOSIS — N186 End stage renal disease: Secondary | ICD-10-CM | POA: Diagnosis not present

## 2020-04-22 DIAGNOSIS — Z992 Dependence on renal dialysis: Secondary | ICD-10-CM | POA: Diagnosis not present

## 2020-04-25 DIAGNOSIS — N186 End stage renal disease: Secondary | ICD-10-CM | POA: Diagnosis not present

## 2020-04-25 DIAGNOSIS — Z992 Dependence on renal dialysis: Secondary | ICD-10-CM | POA: Diagnosis not present

## 2020-04-27 DIAGNOSIS — Z992 Dependence on renal dialysis: Secondary | ICD-10-CM | POA: Diagnosis not present

## 2020-04-27 DIAGNOSIS — N186 End stage renal disease: Secondary | ICD-10-CM | POA: Diagnosis not present

## 2020-04-29 DIAGNOSIS — Z992 Dependence on renal dialysis: Secondary | ICD-10-CM | POA: Diagnosis not present

## 2020-04-29 DIAGNOSIS — N186 End stage renal disease: Secondary | ICD-10-CM | POA: Diagnosis not present

## 2020-05-03 DIAGNOSIS — Z992 Dependence on renal dialysis: Secondary | ICD-10-CM | POA: Diagnosis not present

## 2020-05-03 DIAGNOSIS — N186 End stage renal disease: Secondary | ICD-10-CM | POA: Diagnosis not present

## 2020-05-04 DIAGNOSIS — Z992 Dependence on renal dialysis: Secondary | ICD-10-CM | POA: Diagnosis not present

## 2020-05-04 DIAGNOSIS — N186 End stage renal disease: Secondary | ICD-10-CM | POA: Diagnosis not present

## 2020-05-06 DIAGNOSIS — N186 End stage renal disease: Secondary | ICD-10-CM | POA: Diagnosis not present

## 2020-05-06 DIAGNOSIS — Z992 Dependence on renal dialysis: Secondary | ICD-10-CM | POA: Diagnosis not present

## 2020-05-09 DIAGNOSIS — Z992 Dependence on renal dialysis: Secondary | ICD-10-CM | POA: Diagnosis not present

## 2020-05-09 DIAGNOSIS — N186 End stage renal disease: Secondary | ICD-10-CM | POA: Diagnosis not present

## 2020-05-11 DIAGNOSIS — N186 End stage renal disease: Secondary | ICD-10-CM | POA: Diagnosis not present

## 2020-05-11 DIAGNOSIS — Z992 Dependence on renal dialysis: Secondary | ICD-10-CM | POA: Diagnosis not present

## 2020-05-13 DIAGNOSIS — Z992 Dependence on renal dialysis: Secondary | ICD-10-CM | POA: Diagnosis not present

## 2020-05-13 DIAGNOSIS — N186 End stage renal disease: Secondary | ICD-10-CM | POA: Diagnosis not present

## 2020-05-16 DIAGNOSIS — Z992 Dependence on renal dialysis: Secondary | ICD-10-CM | POA: Diagnosis not present

## 2020-05-16 DIAGNOSIS — N186 End stage renal disease: Secondary | ICD-10-CM | POA: Diagnosis not present

## 2020-05-18 DIAGNOSIS — N186 End stage renal disease: Secondary | ICD-10-CM | POA: Diagnosis not present

## 2020-05-18 DIAGNOSIS — Z992 Dependence on renal dialysis: Secondary | ICD-10-CM | POA: Diagnosis not present

## 2020-05-20 DIAGNOSIS — N186 End stage renal disease: Secondary | ICD-10-CM | POA: Diagnosis not present

## 2020-05-20 DIAGNOSIS — Z992 Dependence on renal dialysis: Secondary | ICD-10-CM | POA: Diagnosis not present

## 2020-05-23 DIAGNOSIS — Z992 Dependence on renal dialysis: Secondary | ICD-10-CM | POA: Diagnosis not present

## 2020-05-23 DIAGNOSIS — N186 End stage renal disease: Secondary | ICD-10-CM | POA: Diagnosis not present

## 2020-05-25 DIAGNOSIS — Z992 Dependence on renal dialysis: Secondary | ICD-10-CM | POA: Diagnosis not present

## 2020-05-25 DIAGNOSIS — N186 End stage renal disease: Secondary | ICD-10-CM | POA: Diagnosis not present

## 2020-05-27 DIAGNOSIS — N186 End stage renal disease: Secondary | ICD-10-CM | POA: Diagnosis not present

## 2020-05-27 DIAGNOSIS — Z992 Dependence on renal dialysis: Secondary | ICD-10-CM | POA: Diagnosis not present

## 2020-05-30 DIAGNOSIS — N186 End stage renal disease: Secondary | ICD-10-CM | POA: Diagnosis not present

## 2020-05-30 DIAGNOSIS — Z992 Dependence on renal dialysis: Secondary | ICD-10-CM | POA: Diagnosis not present

## 2020-06-01 DIAGNOSIS — N186 End stage renal disease: Secondary | ICD-10-CM | POA: Diagnosis not present

## 2020-06-01 DIAGNOSIS — Z992 Dependence on renal dialysis: Secondary | ICD-10-CM | POA: Diagnosis not present

## 2020-06-03 DIAGNOSIS — Z992 Dependence on renal dialysis: Secondary | ICD-10-CM | POA: Diagnosis not present

## 2020-06-03 DIAGNOSIS — N186 End stage renal disease: Secondary | ICD-10-CM | POA: Diagnosis not present

## 2020-06-06 DIAGNOSIS — Z992 Dependence on renal dialysis: Secondary | ICD-10-CM | POA: Diagnosis not present

## 2020-06-06 DIAGNOSIS — N186 End stage renal disease: Secondary | ICD-10-CM | POA: Diagnosis not present

## 2020-06-08 DIAGNOSIS — Z992 Dependence on renal dialysis: Secondary | ICD-10-CM | POA: Diagnosis not present

## 2020-06-08 DIAGNOSIS — N186 End stage renal disease: Secondary | ICD-10-CM | POA: Diagnosis not present

## 2020-06-10 DIAGNOSIS — N186 End stage renal disease: Secondary | ICD-10-CM | POA: Diagnosis not present

## 2020-06-10 DIAGNOSIS — Z992 Dependence on renal dialysis: Secondary | ICD-10-CM | POA: Diagnosis not present

## 2020-06-13 DIAGNOSIS — N186 End stage renal disease: Secondary | ICD-10-CM | POA: Diagnosis not present

## 2020-06-13 DIAGNOSIS — Z992 Dependence on renal dialysis: Secondary | ICD-10-CM | POA: Diagnosis not present

## 2020-06-15 DIAGNOSIS — Z992 Dependence on renal dialysis: Secondary | ICD-10-CM | POA: Diagnosis not present

## 2020-06-15 DIAGNOSIS — N186 End stage renal disease: Secondary | ICD-10-CM | POA: Diagnosis not present

## 2020-06-17 DIAGNOSIS — Z992 Dependence on renal dialysis: Secondary | ICD-10-CM | POA: Diagnosis not present

## 2020-06-17 DIAGNOSIS — N186 End stage renal disease: Secondary | ICD-10-CM | POA: Diagnosis not present

## 2020-06-21 DIAGNOSIS — Z992 Dependence on renal dialysis: Secondary | ICD-10-CM | POA: Diagnosis not present

## 2020-06-21 DIAGNOSIS — N186 End stage renal disease: Secondary | ICD-10-CM | POA: Diagnosis not present

## 2020-06-22 DIAGNOSIS — Z992 Dependence on renal dialysis: Secondary | ICD-10-CM | POA: Diagnosis not present

## 2020-06-22 DIAGNOSIS — N186 End stage renal disease: Secondary | ICD-10-CM | POA: Diagnosis not present

## 2020-06-24 DIAGNOSIS — N186 End stage renal disease: Secondary | ICD-10-CM | POA: Diagnosis not present

## 2020-06-24 DIAGNOSIS — Z992 Dependence on renal dialysis: Secondary | ICD-10-CM | POA: Diagnosis not present

## 2020-06-27 DIAGNOSIS — E119 Type 2 diabetes mellitus without complications: Secondary | ICD-10-CM | POA: Diagnosis not present

## 2020-06-27 DIAGNOSIS — Z992 Dependence on renal dialysis: Secondary | ICD-10-CM | POA: Diagnosis not present

## 2020-06-27 DIAGNOSIS — N186 End stage renal disease: Secondary | ICD-10-CM | POA: Diagnosis not present

## 2020-06-28 ENCOUNTER — Other Ambulatory Visit: Payer: Self-pay | Admitting: Internal Medicine

## 2020-06-28 NOTE — Telephone Encounter (Signed)
Last filled 04-13-20 #60 Last OV 02-02-20 Next OV 02-07-21 [facility]

## 2020-07-01 DIAGNOSIS — N186 End stage renal disease: Secondary | ICD-10-CM | POA: Diagnosis not present

## 2020-07-01 DIAGNOSIS — Z992 Dependence on renal dialysis: Secondary | ICD-10-CM | POA: Diagnosis not present

## 2020-07-04 DIAGNOSIS — Z992 Dependence on renal dialysis: Secondary | ICD-10-CM | POA: Diagnosis not present

## 2020-07-04 DIAGNOSIS — N186 End stage renal disease: Secondary | ICD-10-CM | POA: Diagnosis not present

## 2020-07-06 DIAGNOSIS — Z992 Dependence on renal dialysis: Secondary | ICD-10-CM | POA: Diagnosis not present

## 2020-07-06 DIAGNOSIS — N186 End stage renal disease: Secondary | ICD-10-CM | POA: Diagnosis not present

## 2020-07-07 DIAGNOSIS — H00014 Hordeolum externum left upper eyelid: Secondary | ICD-10-CM | POA: Diagnosis not present

## 2020-07-08 DIAGNOSIS — Z992 Dependence on renal dialysis: Secondary | ICD-10-CM | POA: Diagnosis not present

## 2020-07-08 DIAGNOSIS — N186 End stage renal disease: Secondary | ICD-10-CM | POA: Diagnosis not present

## 2020-07-11 DIAGNOSIS — N186 End stage renal disease: Secondary | ICD-10-CM | POA: Diagnosis not present

## 2020-07-11 DIAGNOSIS — Z992 Dependence on renal dialysis: Secondary | ICD-10-CM | POA: Diagnosis not present

## 2020-07-13 DIAGNOSIS — N186 End stage renal disease: Secondary | ICD-10-CM | POA: Diagnosis not present

## 2020-07-13 DIAGNOSIS — Z992 Dependence on renal dialysis: Secondary | ICD-10-CM | POA: Diagnosis not present

## 2020-07-14 DIAGNOSIS — H00014 Hordeolum externum left upper eyelid: Secondary | ICD-10-CM | POA: Diagnosis not present

## 2020-07-15 DIAGNOSIS — N186 End stage renal disease: Secondary | ICD-10-CM | POA: Diagnosis not present

## 2020-07-15 DIAGNOSIS — Z992 Dependence on renal dialysis: Secondary | ICD-10-CM | POA: Diagnosis not present

## 2020-07-18 DIAGNOSIS — Z992 Dependence on renal dialysis: Secondary | ICD-10-CM | POA: Diagnosis not present

## 2020-07-18 DIAGNOSIS — N186 End stage renal disease: Secondary | ICD-10-CM | POA: Diagnosis not present

## 2020-07-20 DIAGNOSIS — N186 End stage renal disease: Secondary | ICD-10-CM | POA: Diagnosis not present

## 2020-07-20 DIAGNOSIS — Z992 Dependence on renal dialysis: Secondary | ICD-10-CM | POA: Diagnosis not present

## 2020-07-22 DIAGNOSIS — N186 End stage renal disease: Secondary | ICD-10-CM | POA: Diagnosis not present

## 2020-07-22 DIAGNOSIS — Z992 Dependence on renal dialysis: Secondary | ICD-10-CM | POA: Diagnosis not present

## 2020-07-25 DIAGNOSIS — Z992 Dependence on renal dialysis: Secondary | ICD-10-CM | POA: Diagnosis not present

## 2020-07-25 DIAGNOSIS — N186 End stage renal disease: Secondary | ICD-10-CM | POA: Diagnosis not present

## 2020-07-27 DIAGNOSIS — Z992 Dependence on renal dialysis: Secondary | ICD-10-CM | POA: Diagnosis not present

## 2020-07-27 DIAGNOSIS — N186 End stage renal disease: Secondary | ICD-10-CM | POA: Diagnosis not present

## 2020-07-29 DIAGNOSIS — N186 End stage renal disease: Secondary | ICD-10-CM | POA: Diagnosis not present

## 2020-07-29 DIAGNOSIS — Z992 Dependence on renal dialysis: Secondary | ICD-10-CM | POA: Diagnosis not present

## 2020-08-01 DIAGNOSIS — Z992 Dependence on renal dialysis: Secondary | ICD-10-CM | POA: Diagnosis not present

## 2020-08-01 DIAGNOSIS — N186 End stage renal disease: Secondary | ICD-10-CM | POA: Diagnosis not present

## 2020-08-03 DIAGNOSIS — Z992 Dependence on renal dialysis: Secondary | ICD-10-CM | POA: Diagnosis not present

## 2020-08-03 DIAGNOSIS — N186 End stage renal disease: Secondary | ICD-10-CM | POA: Diagnosis not present

## 2020-08-05 DIAGNOSIS — N186 End stage renal disease: Secondary | ICD-10-CM | POA: Diagnosis not present

## 2020-08-05 DIAGNOSIS — Z992 Dependence on renal dialysis: Secondary | ICD-10-CM | POA: Diagnosis not present

## 2020-08-08 DIAGNOSIS — Z992 Dependence on renal dialysis: Secondary | ICD-10-CM | POA: Diagnosis not present

## 2020-08-08 DIAGNOSIS — N186 End stage renal disease: Secondary | ICD-10-CM | POA: Diagnosis not present

## 2020-08-10 DIAGNOSIS — N186 End stage renal disease: Secondary | ICD-10-CM | POA: Diagnosis not present

## 2020-08-10 DIAGNOSIS — Z992 Dependence on renal dialysis: Secondary | ICD-10-CM | POA: Diagnosis not present

## 2020-08-12 DIAGNOSIS — Z992 Dependence on renal dialysis: Secondary | ICD-10-CM | POA: Diagnosis not present

## 2020-08-12 DIAGNOSIS — N186 End stage renal disease: Secondary | ICD-10-CM | POA: Diagnosis not present

## 2020-08-15 DIAGNOSIS — N186 End stage renal disease: Secondary | ICD-10-CM | POA: Diagnosis not present

## 2020-08-15 DIAGNOSIS — Z992 Dependence on renal dialysis: Secondary | ICD-10-CM | POA: Diagnosis not present

## 2020-08-17 DIAGNOSIS — N186 End stage renal disease: Secondary | ICD-10-CM | POA: Diagnosis not present

## 2020-08-17 DIAGNOSIS — Z992 Dependence on renal dialysis: Secondary | ICD-10-CM | POA: Diagnosis not present

## 2020-08-19 DIAGNOSIS — Z992 Dependence on renal dialysis: Secondary | ICD-10-CM | POA: Diagnosis not present

## 2020-08-19 DIAGNOSIS — N186 End stage renal disease: Secondary | ICD-10-CM | POA: Diagnosis not present

## 2020-08-22 ENCOUNTER — Ambulatory Visit: Payer: Medicare HMO | Admitting: Physician Assistant

## 2020-08-22 ENCOUNTER — Ambulatory Visit (INDEPENDENT_AMBULATORY_CARE_PROVIDER_SITE_OTHER): Payer: Medicare HMO

## 2020-08-22 ENCOUNTER — Encounter: Payer: Self-pay | Admitting: Physician Assistant

## 2020-08-22 VITALS — Ht 63.0 in | Wt 250.4 lb

## 2020-08-22 DIAGNOSIS — G8929 Other chronic pain: Secondary | ICD-10-CM

## 2020-08-22 DIAGNOSIS — M1712 Unilateral primary osteoarthritis, left knee: Secondary | ICD-10-CM | POA: Diagnosis not present

## 2020-08-22 DIAGNOSIS — Z992 Dependence on renal dialysis: Secondary | ICD-10-CM | POA: Diagnosis not present

## 2020-08-22 DIAGNOSIS — M25562 Pain in left knee: Secondary | ICD-10-CM

## 2020-08-22 DIAGNOSIS — N186 End stage renal disease: Secondary | ICD-10-CM | POA: Diagnosis not present

## 2020-08-22 NOTE — Progress Notes (Signed)
Office Visit Note   Patient: Carrie [name]           Date of Birth: Aug 27, 1950           MRN: [id number] Visit Date: 08/22/2020              Requested by: Venia Carbon, MD [place],  [place] [id number] PCP: Venia Carbon, MD   Assessment & Plan: Visit Diagnoses:  1. Chronic pain of left knee   2. Primary osteoarthritis of left knee     Plan: Patient wishes to proceed with left total knee arthroplasty in the near future.  Discussed with her that she needs to lower her BMI before the surgery.  She needs to lose approximately 25 pounds.  See her back in 6 weeks and obtain height and weight on her at that time.  Questions were encouraged and answered at length today.  Follow-Up Instructions: Return in about 6 weeks (around 10/03/2020).   Orders:  Orders Placed This Encounter  Procedures  . XR Knee 1-2 Views Left   No orders of the defined types were placed in this encounter.     Procedures: No procedures performed   Clinical Data: No additional findings.   Subjective: Chief Complaint  Patient presents with  . Left Knee - Pain    HPI Carrie [name] returns today due to left knee pain.  She has undergone a right total knee arthroplasty back in 2018 in the left total hip back in 2017 both are doing well.  However she is developing more more pain left knee.  She is on dialysis.  She reports good control of her diabetes.  States over the past weekend that her left knee pains became increasingly worse.  No known injury.  She takes Tylenol for the pain. Review of Systems See HPI.  Objective: Vital Signs: Ht 5\' 3"  (1.6 m)   Wt 250 lb 6.4 oz (113.6 kg)   BMI 44.36 kg/m   Physical Exam Constitutional:      Appearance: She is not ill-appearing or diaphoretic.  Pulmonary:     Effort: Pulmonary effort is normal.  Neurological:     Mental Status: She is alert and oriented to person, place, and time.  Psychiatric:        Mood and Affect: Mood  normal.     Ortho Exam Right knee excellent range of motion without pain.  Left knee full range of motion.  Tenderness along medial joint line.  Slight varus deformity.  Positive crepitus with passive range of motion.  No instability valgus varus stressing. Specialty Comments:  No specialty comments available.  Imaging: XR Knee 1-2 Views Left  Result Date: 08/22/2020 Left knee 2 views: No acute fracture.  Bone-on-bone medial compartment.  Osteophytes off the lateral compartment.  Severe patellofemoral changes.  Knee is well located.    PMFS History: Patient Active Problem List   Diagnosis Date Noted  . Primary osteoarthritis of left knee 08/22/2020  . Meningioma ([place]) 07/02/2019  . Complication from renal dialysis device 11/20/2018  . ESRD (end stage renal disease) ([place]) 08/04/2018  . Chronic diastolic CHF (congestive heart failure) ([place]) 11/20/2017  . Cough 10/01/2017  . Status post total right knee replacement 04/30/2017  . Unilateral primary osteoarthritis, right knee 03/18/2017  . History of total hip replacement, left 03/18/2017  . Unilateral primary osteoarthritis, left hip 03/27/2016  . Status post total replacement of left hip 03/27/2016  . Mood disorder ([place])  04/01/2015  . Osteoarthritis of right knee 02/03/2014  . Advanced directives, counseling/discussion 02/03/2014  . Routine general medical examination at a health care facility 07/08/2012  . BMI 40.0-44.9, adult ([place]) 07/08/2012  . Type 2 diabetes, controlled, with renal manifestation ([place])   . HYPERCHOLESTEROLEMIA 09/26/2006  . Essential hypertension, benign 09/26/2006  . ALLERGIC RHINITIS 09/26/2006  . History of meningioma 08/03/2006   Past Medical History:  Diagnosis Date  . Allergy   . Anemia   . Arthritis    "right knee" (10/26'/2017)  . CKD (chronic kidney disease), stage III ([place])    12/2015 (Dr. Lavonia Dana)  . Depression   . History of blood transfusion 2008   "when I had brain tumor  surgery"  . History of MRSA infection 2008  . Hypertension   . 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 Type II diabetes mellitus ([place]) 2011   on metformin until yr ago- no med now - off due to kidneys    Family History  Problem Relation Age of Onset  . Hypertension Other        Strong family history   . Breast cancer Mother 76  . Heart failure Sister   . Heart disease Sister     Past Surgical History:  Procedure Laterality Date  . A/V FISTULAGRAM Left 03/10/2019   Procedure: A/V FISTULAGRAM;  Surgeon: Katha Cabal, MD;  Location: [place] [facility];  Service: Cardiovascular;  Laterality: Left;  . A/V FISTULAGRAM Left 07/14/2019   Procedure: A/V FISTULAGRAM;  Surgeon: Katha Cabal, MD;  Location: [place] [facility];  Service: Cardiovascular;  Laterality: Left;  . A/V FISTULAGRAM Left 01/07/2020   Procedure: A/V FISTULAGRAM;  Surgeon: Katha Cabal, MD;  Location: [place] [facility];  Service: Cardiovascular;  Laterality: Left;  . AV FISTULA PLACEMENT Left 11/07/2018   Procedure: ARTERIOVENOUS (AV) FISTULA CREATION ( BRACHIO BASILIC ) VS BRACHIAL AXILLARY GRAFT;  Surgeon: Katha Cabal, MD;  Location: [facility];  Service: Vascular;  Laterality: Left;  . BRAIN MENINGIOMA EXCISION  08/2006   Foramina magnum meningioma  . CERVICAL DISCECTOMY  1996   Dr. Alric Seton  . CESAREAN SECTION  1981  . COLONOSCOPY    . DIALYSIS/PERMA CATHETER INSERTION N/A 08/04/2018   Procedure: DIALYSIS/PERMA CATHETER INSERTION;  Surgeon: Algernon Huxley, MD;  Location: [place] [facility];  Service: Cardiovascular;  Laterality: N/A;  . DIALYSIS/PERMA CATHETER REMOVAL N/A 12/25/2018   Procedure: DIALYSIS/PERMA CATHETER REMOVAL;  Surgeon: Algernon Huxley, MD;  Location: [place] [facility];  Service: Cardiovascular;  Laterality: N/A;  . DILATION AND CURETTAGE OF UTERUS    . JOINT REPLACEMENT     right knee left hip  . Right wrist x-ray  2007   Deg. at 1st MCP  . TOTAL HIP ARTHROPLASTY Left  03/27/2016   Procedure: LEFT TOTAL HIP ARTHROPLASTY ANTERIOR APPROACH;  Surgeon: Mcarthur Rossetti, MD;  Location: [place];  Service: Orthopedics;  Laterality: Left;  . TOTAL KNEE ARTHROPLASTY Right 04/30/2017   Procedure: RIGHT TOTAL KNEE ARTHROPLASTY;  Surgeon: Mcarthur Rossetti, MD;  Location: [place];  Service: Orthopedics;  Laterality: Right;  . TUBAL LIGATION  1981   Social History   Occupational History  . 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 Occupation: Disabled since brain surgery  Tobacco Use  . Smoking status: Never Smoker  . Smokeless tobacco: Never Used  Vaping Use  . Vaping Use: Never used  Substance and Sexual Activity  .  Alcohol use: No    Alcohol/week: 0.0 standard drinks  . Drug use: No  . Sexual activity: Never

## 2020-08-24 DIAGNOSIS — N186 End stage renal disease: Secondary | ICD-10-CM | POA: Diagnosis not present

## 2020-08-24 DIAGNOSIS — Z992 Dependence on renal dialysis: Secondary | ICD-10-CM | POA: Diagnosis not present

## 2020-08-26 DIAGNOSIS — Z992 Dependence on renal dialysis: Secondary | ICD-10-CM | POA: Diagnosis not present

## 2020-08-26 DIAGNOSIS — N186 End stage renal disease: Secondary | ICD-10-CM | POA: Diagnosis not present

## 2020-08-29 DIAGNOSIS — N186 End stage renal disease: Secondary | ICD-10-CM | POA: Diagnosis not present

## 2020-08-29 DIAGNOSIS — Z992 Dependence on renal dialysis: Secondary | ICD-10-CM | POA: Diagnosis not present

## 2020-08-31 DIAGNOSIS — N186 End stage renal disease: Secondary | ICD-10-CM | POA: Diagnosis not present

## 2020-08-31 DIAGNOSIS — Z992 Dependence on renal dialysis: Secondary | ICD-10-CM | POA: Diagnosis not present

## 2020-09-01 ENCOUNTER — Other Ambulatory Visit: Payer: Self-pay | Admitting: Internal Medicine

## 2020-09-01 DIAGNOSIS — N186 End stage renal disease: Secondary | ICD-10-CM | POA: Diagnosis not present

## 2020-09-01 DIAGNOSIS — Z992 Dependence on renal dialysis: Secondary | ICD-10-CM | POA: Diagnosis not present

## 2020-09-01 NOTE — Telephone Encounter (Signed)
Last filled 06-29-20 #60 Last OV 02-02-20 Next OV 02-07-21 [facility]

## 2020-09-02 DIAGNOSIS — Z992 Dependence on renal dialysis: Secondary | ICD-10-CM | POA: Diagnosis not present

## 2020-09-02 DIAGNOSIS — N186 End stage renal disease: Secondary | ICD-10-CM | POA: Diagnosis not present

## 2020-09-05 ENCOUNTER — Telehealth: Payer: Self-pay

## 2020-09-05 DIAGNOSIS — B028 Zoster with other complications: Secondary | ICD-10-CM | POA: Diagnosis not present

## 2020-09-05 NOTE — Telephone Encounter (Signed)
[place] Night - Client TELEPHONE ADVICE RECORD AccessNurse Patient Name: Carrie [name] Gender: Female DOB: Jan 01, 1951 Age: 70 Y 71 M 26 D Return Phone Number: [phone] (Primary) Address: City/ State/ Zip: [place] [address] Client [place] Night - Client Client Site [place] - Night Contact Type Call Who Is Calling Patient / Member / Family / Caregiver Call Type Triage / Clinical Relationship To Patient Self Return Phone Number [phone] (Primary) Chief Complaint Leg Pain Reason for Call Symptomatic / Request for [place] states that she thinks she has shingles. Her leg is hurting, burning, and she has blisters. [place] Not Listed Next Care UC Translation No Nurse Assessment Nurse: Jearld Pies, RN, Lovena Le Date/Time (Eastern Time): 09/05/2020 8:20:16 AM Confirm and document reason for call. If symptomatic, describe symptoms. ---Caller states that she thinks she has shingles. Her leg is hurting, burning, and she has blisters. Symptoms started Thurs/Fri. 101.39F temp and 2 Tylenol taken with no relief. Current pain level 10/10. Denies chest pain, difficulty breathing, or any other symptoms at this time. Does the patient have any new or worsening symptoms? ---Yes Will a triage be completed? ---Yes Related visit to physician within the last 2 weeks? ---No Does the PT have any chronic conditions? (i.e. diabetes, asthma, this includes High risk factors for pregnancy, etc.) ---Yes List chronic conditions. ---Dialysis Is this a behavioral health or substance abuse call? ---No Guidelines Guideline Title Affirmed Question Affirmed Notes Nurse Date/Time (Eastern Time) Shingles Patient sounds very sick or weak to the triager Jearld Pies, RN, Lovena Le 09/05/2020 8:22:05 AM Disp. Time Eilene Ghazi Time) Disposition Final User 09/05/2020 8:30:16 AM Go to ED Now (or  PCP triage) Yes Jearld Pies, RN, Lovena Le PLEASE NOTE: All timestamps contained within this report are represented as [place] Standard Time. CONFIDENTIALTY NOTICE: This fax transmission is intended only for the addressee. It contains information that is legally privileged, confidential or otherwise protected from use or disclosure. If you are not the intended recipient, you are strictly prohibited from reviewing, disclosing, copying using or disseminating any of this information or taking any action in reliance on or regarding this information. If you have received this fax in error, please notify us immediately by telephone so that we can arrange for its return to [place]. Phone: [phone], Toll-Free: [phone], Fax: [phone] Page: 2 of 3 Call Id: [id number] [place] Disagree/Comply Comply Caller Understands Yes PreDisposition Call Doctor Care Advice Given Per Guideline GO TO ED NOW (OR PCP TRIAGE): Comments User: Malissa Hippo, RN Date/Time Eilene Ghazi Time): 09/05/2020 8:29:24 AM Contacted back line @ [phone] for appt. Advised can not be seen in office due to fever. Advised to be [facility] at [facility]. Pt verbalizes understanding. Referrals GO TO FACILITY OTHER - SPECIFY Triage Details User: Jake Bathe Date/Time: 09/05/2020 8:22:05 AM Triage Guideline: Shingles Difficult to awaken or acting confused (e.g., disoriented, slurred speech) -----No Sounds like a life-threatening emergency to the triager -----No [1] Localized rash AND [2] doesn't match the SYMPTOMS of shingles -----No [1] Back pain AND [2] doesn't match the SYMPTOMS of shingles -----No Shingles Vaccine (Recombinant Zoster Vaccine; RZV; Shingrix), questions about -----No Patient sounds very sick or weak to the triager -----Yes Disposition: Go to ED Now (or PCP triage) GO TO ED NOW (OR PCP TRIAGE): PLEASE NOTE: All timestamps contained within this report are represented as [place] Standard Time. CONFIDENTIALTY NOTICE: This fax  transmission is intended only for the addressee. It contains information that is  legally privileged, confidential or otherwise protected from use or disclosure. If you are not the intended recipient, you are strictly prohibited from reviewing, disclosing, copying using or disseminating any of this information or taking any action in reliance on or regarding this information. If you have received this fax in error, please notify us immediately by telephone so that we can arrange for its return to [place]. Phone: [phone], Toll-Free: [phone], Fax: [phone] Page: 3 of 3 Call Id: [id number] Triage Details

## 2020-09-05 NOTE — Telephone Encounter (Signed)
It does sound like this is something that needed to be checked. Please check on her tomorrow to be sure she got taken care of

## 2020-09-05 NOTE — Telephone Encounter (Signed)
Unable to reach pt by phone and I spoke with Carrie [name] pts sister (DPR signed) and Carrie [name] is presently driving pt to [facility] in [place] for eval of rash with blisters on leg and low grade fever this morning but pt is taking Tylenol also. Sending FYI to Dr Silvio Pate.

## 2020-09-06 MED ORDER — VALACYCLOVIR HCL 1 G PO TABS
1000.0000 mg | ORAL_TABLET | Freq: Three times a day (TID) | ORAL | 0 refills | Status: DC
Start: 2020-09-06 — End: 2020-10-25

## 2020-09-06 NOTE — Telephone Encounter (Signed)
Spoke to pt. Sent in new rx for Valtrex 1gm. She has about 16 gabapentin. She will get with [place] prior to the end of the week if she needs more help with pain.

## 2020-09-06 NOTE — Telephone Encounter (Signed)
Okay to send in valacyclovir 1gm tid for 7 days (#21 x 1) Have her increase nighttime gabapentin to 200mg ----then 300mg   (can even take an extra after an hour if the 200mg  doesn't help) and can go higher if that isn't helping--just let us know May want to consider a visit (virtual or in person)

## 2020-09-06 NOTE — Telephone Encounter (Addendum)
Spoke to pt. She went to the UC. They diagnosed her with Shingles. They put her on Valtrex 500mg  daily for 7 days. Gabapentin 100mg  1 daily at bedtime, which is not helping at all. Using lidocaine roll on. She is in a lot of pain. Also put her on cephalexin.

## 2020-09-07 ENCOUNTER — Telehealth: Payer: Self-pay | Admitting: Internal Medicine

## 2020-09-07 DIAGNOSIS — N186 End stage renal disease: Secondary | ICD-10-CM | POA: Diagnosis not present

## 2020-09-07 DIAGNOSIS — Z992 Dependence on renal dialysis: Secondary | ICD-10-CM | POA: Diagnosis not present

## 2020-09-07 MED ORDER — HYDROCODONE-ACETAMINOPHEN 5-325 MG PO TABS: 1.0000 | ORAL_TABLET | ORAL | 0 refills | Status: DC | PRN

## 2020-09-07 MED ORDER — GABAPENTIN 300 MG PO CAPS
300.0000 mg | ORAL_CAPSULE | Freq: Two times a day (BID) | ORAL | 3 refills | Status: DC
Start: 2020-09-07 — End: 2020-10-25

## 2020-09-07 NOTE — Telephone Encounter (Signed)
Spoke to pt. She will try as discussed in the note. She will keep [place] updated if she needs to increase or make changes.

## 2020-09-07 NOTE — Telephone Encounter (Signed)
She can try the 300mg  up to 3 times a day----start with one and then go to 2 (watch for sedation). She can double the night dose---to 600mg  also

## 2020-09-07 NOTE — Telephone Encounter (Signed)
Patient is a dialysis and states that she is in a lot of pain. She is requesting something for the pain from the shingles. Please advise. EM

## 2020-09-07 NOTE — Addendum Note (Signed)
Addended by: Viviana Simpler I on: 09/07/2020 10:26 AM   Modules accepted: Orders

## 2020-09-07 NOTE — Telephone Encounter (Signed)
Spoke to pt. She took 300mg  last night and had no relief. She said she is able to take the gabapentin during the day. She will need a new rx, possibly for 300mg , to dose accordingly.

## 2020-09-07 NOTE — Telephone Encounter (Signed)
She was telling me yesterday that in the past, gabapentin has not helped her pain for other things.

## 2020-09-07 NOTE — Addendum Note (Signed)
Addended by: Viviana Simpler I on: 09/07/2020 12:02 PM   Modules accepted: Orders

## 2020-09-07 NOTE — Telephone Encounter (Signed)
Please let her know that gabapentin is particularly good for shingles pain and that she may need to push the dose much more (some people take up to 3200mg  daily-----she may need to increase to 600mg  at night and lower doses during the day to get relief).  I did send a prescription for hydrocodone for when the pain is really bad--but that will have to be limited

## 2020-09-09 DIAGNOSIS — N186 End stage renal disease: Secondary | ICD-10-CM | POA: Diagnosis not present

## 2020-09-09 DIAGNOSIS — Z992 Dependence on renal dialysis: Secondary | ICD-10-CM | POA: Diagnosis not present

## 2020-09-12 DIAGNOSIS — Z992 Dependence on renal dialysis: Secondary | ICD-10-CM | POA: Diagnosis not present

## 2020-09-12 DIAGNOSIS — N186 End stage renal disease: Secondary | ICD-10-CM | POA: Diagnosis not present

## 2020-09-14 DIAGNOSIS — Z992 Dependence on renal dialysis: Secondary | ICD-10-CM | POA: Diagnosis not present

## 2020-09-14 DIAGNOSIS — N186 End stage renal disease: Secondary | ICD-10-CM | POA: Diagnosis not present

## 2020-09-16 DIAGNOSIS — N186 End stage renal disease: Secondary | ICD-10-CM | POA: Diagnosis not present

## 2020-09-16 DIAGNOSIS — Z992 Dependence on renal dialysis: Secondary | ICD-10-CM | POA: Diagnosis not present

## 2020-09-19 DIAGNOSIS — Z992 Dependence on renal dialysis: Secondary | ICD-10-CM | POA: Diagnosis not present

## 2020-09-19 DIAGNOSIS — N186 End stage renal disease: Secondary | ICD-10-CM | POA: Diagnosis not present

## 2020-09-21 DIAGNOSIS — N186 End stage renal disease: Secondary | ICD-10-CM | POA: Diagnosis not present

## 2020-09-21 DIAGNOSIS — Z992 Dependence on renal dialysis: Secondary | ICD-10-CM | POA: Diagnosis not present

## 2020-09-23 DIAGNOSIS — N186 End stage renal disease: Secondary | ICD-10-CM | POA: Diagnosis not present

## 2020-09-23 DIAGNOSIS — Z992 Dependence on renal dialysis: Secondary | ICD-10-CM | POA: Diagnosis not present

## 2020-09-26 ENCOUNTER — Encounter: Payer: Self-pay | Admitting: Family Medicine

## 2020-09-26 ENCOUNTER — Other Ambulatory Visit: Payer: Self-pay | Admitting: Family Medicine

## 2020-09-26 ENCOUNTER — Other Ambulatory Visit (INDEPENDENT_AMBULATORY_CARE_PROVIDER_SITE_OTHER): Payer: Medicare HMO

## 2020-09-26 ENCOUNTER — Ambulatory Visit (INDEPENDENT_AMBULATORY_CARE_PROVIDER_SITE_OTHER): Payer: Medicare HMO | Admitting: Family Medicine

## 2020-09-26 VITALS — BP 163/87 | Temp 99.5°F

## 2020-09-26 DIAGNOSIS — R6889 Other general symptoms and signs: Secondary | ICD-10-CM

## 2020-09-26 DIAGNOSIS — Z20822 Contact with and (suspected) exposure to covid-19: Secondary | ICD-10-CM

## 2020-09-26 DIAGNOSIS — Z8619 Personal history of other infectious and parasitic diseases: Secondary | ICD-10-CM | POA: Diagnosis not present

## 2020-09-26 DIAGNOSIS — N186 End stage renal disease: Secondary | ICD-10-CM

## 2020-09-26 DIAGNOSIS — J069 Acute upper respiratory infection, unspecified: Secondary | ICD-10-CM | POA: Diagnosis not present

## 2020-09-26 DIAGNOSIS — R059 Cough, unspecified: Secondary | ICD-10-CM

## 2020-09-26 LAB — POCT INFLUENZA A/B
Influenza A, POC: NEGATIVE
Influenza B, POC: NEGATIVE

## 2020-09-26 MED ORDER — HYDROCODONE-HOMATROPINE 5-1.5 MG/5ML PO SYRP: 5.0000 mL | ORAL_SOLUTION | Freq: Three times a day (TID) | ORAL | 0 refills | Status: DC | PRN

## 2020-09-26 MED ORDER — AZITHROMYCIN 250 MG PO TABS: ORAL_TABLET | ORAL | 0 refills | Status: DC

## 2020-09-26 NOTE — Progress Notes (Signed)
I connected with Carrie [name] on 09/26/20 at  4:20 PM EDT by video and verified that I am speaking with the correct person using two identifiers.   I discussed the limitations, risks, security and privacy concerns of performing an evaluation and management service by video and the availability of in person appointments. I also discussed with the patient that there may be a patient responsible charge related to this service. The patient expressed understanding and agreed to proceed.  Patient location: Home Provider Location: [place] Participants: Lesleigh Noe and Golden Hurter Willmann   Subjective:     Carrie [name] is a 70 y.o. female presenting for Cough (Dry, Hurts in chest x 3 days ), Sore Throat, and Nasal Congestion (Clear )     HPI   #cough - sore throat - congestion - started on 09/25/2020 - sick contact: no - not sure if she can still smell - vomiting this morning and decreased appetite  - temperature 99-100 degrees - does note some mild swelling in the legs at site of shingles   On dialysis and skipped today as she wasn't feeling   Prior shingles - did not get treatment - wondering about when she can get her shot   Review of Systems  HENT: Positive for congestion and sinus pain. Negative for sinus pressure.   Respiratory: Positive for cough and shortness of breath.   Cardiovascular: Positive for chest pain (when coughing).  Gastrointestinal: Positive for vomiting. Negative for diarrhea and nausea.  Neurological: Positive for headaches.     Social History   Tobacco Use  Smoking Status Never Smoker  Smokeless Tobacco Never Used        Objective:   BP Readings from Last 3 Encounters:  09/26/20 (!) 163/87  02/02/20 116/70  01/07/20 137/85   Wt Readings from Last 3 Encounters:  08/22/20 250 lb 6.4 oz (113.6 kg)  02/02/20 248 lb 6.4 oz (112.7 kg)  01/07/20 247 lb (112 kg)    BP (!) 163/87   Temp 99.5 F (37.5 C) (Oral)     Physical Exam Constitutional:      Appearance: Normal appearance. She is not ill-appearing.  HENT:     Head: Normocephalic and atraumatic.     Right Ear: External ear normal.     Left Ear: External ear normal.  Eyes:     Conjunctiva/sclera: Conjunctivae normal.  Pulmonary:     Effort: Pulmonary effort is normal. No respiratory distress.  Skin:    Comments: Video quality is poor, but can still see what appear to raised lesions.   Neurological:     Mental Status: She is alert. Mental status is at baseline.  Psychiatric:        Mood and Affect: Mood normal.        Behavior: Behavior normal.        Thought Content: Thought content normal.        Judgment: Judgment normal.            Assessment & Plan:   Problem List Items Addressed This Visit      Respiratory   Viral URI with cough - Primary   Relevant Medications   azithromycin (ZITHROMAX) 250 MG tablet   HYDROcodone-homatropine (HYCODAN) 5-1.5 MG/5ML syrup     Genitourinary   ESRD (end stage renal disease) (HCC)    Skipping dialysis today. High risk individual. Denies excess fluid build up and with congestion lower suspicion that cough is related to  ESRD or CHF. Encouraged her to continue medications        Other   History of shingles    Treated with valtrex in early April. Discussed OK for vaccine if rash cleared, but with video quality difficult to assess. Recommend she send mychart photo, wait for no raised lesions, or schedule early appt to see Venia Carbon, MD        Discussed OTC treatment for viral illness - cough syrup for nighttime cough Patient will come today for drive-up testing - will test covid and flu given abrupt onset yesterday - tamiflu if flu positive - covid treatment referral if covid positive  Instructed to isolate until the results come back  Prescribed azithromycin given cough + fever to start if results of viral studies negative  ER precautions for worsening symptoms     Return if symptoms worsen or fail to improve.  Lesleigh Noe, MD

## 2020-09-26 NOTE — Addendum Note (Signed)
Addended by: Ronna Polio on: 09/26/2020 02:40 PM   Modules accepted: Orders

## 2020-09-26 NOTE — Progress Notes (Signed)
Returning for testing

## 2020-09-26 NOTE — Assessment & Plan Note (Signed)
Skipping dialysis today. High risk individual. Denies excess fluid build up and with congestion lower suspicion that cough is related to ESRD or CHF. Encouraged her to continue medications

## 2020-09-26 NOTE — Assessment & Plan Note (Signed)
Treated with valtrex in early April. Discussed OK for vaccine if rash cleared, but with video quality difficult to assess. Recommend she send mychart photo, wait for no raised lesions, or schedule early appt to see Venia Carbon, MD

## 2020-09-27 ENCOUNTER — Other Ambulatory Visit: Payer: Self-pay | Admitting: Family Medicine

## 2020-09-27 DIAGNOSIS — U071 COVID-19: Secondary | ICD-10-CM

## 2020-09-27 LAB — SARS-COV-2, NAA 2 DAY TAT

## 2020-09-27 LAB — NOVEL CORONAVIRUS, NAA: SARS-CoV-2, NAA: DETECTED — AB

## 2020-09-28 ENCOUNTER — Telehealth: Payer: Self-pay | Admitting: Adult Health

## 2020-09-28 DIAGNOSIS — Z992 Dependence on renal dialysis: Secondary | ICD-10-CM | POA: Diagnosis not present

## 2020-09-28 DIAGNOSIS — N186 End stage renal disease: Secondary | ICD-10-CM | POA: Diagnosis not present

## 2020-09-28 DIAGNOSIS — E119 Type 2 diabetes mellitus without complications: Secondary | ICD-10-CM | POA: Diagnosis not present

## 2020-09-28 NOTE — Telephone Encounter (Signed)
I connected by phone with Carrie [name] on 09/28/2020 at 11:36 AM to discuss the potential use of a new treatment for mild to moderate COVID-19 viral infection in non-hospitalized patients.  This patient is a 70 y.o. female that meets the FDA criteria for Emergency Use Authorization of COVID monoclonal antibody bebtelovimab.  Has a (+) direct SARS-CoV-2 viral test result  Has mild or moderate COVID-19   Is NOT hospitalized due to COVID-19  Is within 10 days of symptom onset  Has at least one of the high risk factor(s) for progression to severe COVID-19 and/or hospitalization as defined in EUA.  Specific high risk criteria : Older age (>/= 72 yo), BMI > 25, Chronic Kidney Disease (CKD), Diabetes, Immunosuppressive Disease or Treatment and Cardiovascular disease or hypertension     I have spoken and communicated the following to the patient or parent/caregiver regarding COVID monoclonal antibody treatment:  1. FDA has authorized the emergency use for the treatment of mild to moderate COVID-19 in adults and pediatric patients with positive results of direct SARS-CoV-2 viral testing who are 66 years of age and older weighing at least 40 kg, and who are at high risk for progressing to severe COVID-19 and/or hospitalization.  2. The significant known and potential risks and benefits of COVID monoclonal antibody, and the extent to which such potential risks and benefits are unknown.  3. Information on available alternative treatments and the risks and benefits of those alternatives, including clinical trials.  4. Patients treated with COVID monoclonal antibody should continue to self-isolate and use infection control measures (e.g., wear mask, isolate, social distance, avoid sharing personal items, clean and disinfect "high touch" surfaces, and frequent handwashing) according to CDC guidelines.   5. The patient or parent/caregiver has the option to accept or refuse COVID monoclonal antibody  treatment.  6. Discussion about the monoclonal antibody infusion does not ensure treatment. The patient will be placed on a list and scheduled according to risk, symptom onset and availability. A scheduler will reach to the patient to let them know if we can accommodate their infusion or not.  After reviewing this information with the patient, the patient has DECLINED offer to receive the infusion.  She was given our call back number to call us if she changes her mind. Scot Dock, NP 09/28/2020 11:36 AM

## 2020-09-30 DIAGNOSIS — Z992 Dependence on renal dialysis: Secondary | ICD-10-CM | POA: Diagnosis not present

## 2020-09-30 DIAGNOSIS — N186 End stage renal disease: Secondary | ICD-10-CM | POA: Diagnosis not present

## 2020-10-01 DIAGNOSIS — Z992 Dependence on renal dialysis: Secondary | ICD-10-CM | POA: Diagnosis not present

## 2020-10-01 DIAGNOSIS — N186 End stage renal disease: Secondary | ICD-10-CM | POA: Diagnosis not present

## 2020-10-03 ENCOUNTER — Ambulatory Visit: Payer: Medicare HMO | Admitting: Physician Assistant

## 2020-10-03 DIAGNOSIS — Z992 Dependence on renal dialysis: Secondary | ICD-10-CM | POA: Diagnosis not present

## 2020-10-03 DIAGNOSIS — N186 End stage renal disease: Secondary | ICD-10-CM | POA: Diagnosis not present

## 2020-10-05 ENCOUNTER — Telehealth: Payer: Self-pay

## 2020-10-05 DIAGNOSIS — Z992 Dependence on renal dialysis: Secondary | ICD-10-CM | POA: Diagnosis not present

## 2020-10-05 DIAGNOSIS — N186 End stage renal disease: Secondary | ICD-10-CM | POA: Diagnosis not present

## 2020-10-05 MED ORDER — HYDROCODONE BIT-HOMATROP MBR 5-1.5 MG/5ML PO SOLN
5.0000 mL | Freq: Every evening | ORAL | 0 refills | Status: DC | PRN
Start: 2020-10-05 — End: 2020-10-25

## 2020-10-05 NOTE — Telephone Encounter (Signed)
Seen by Dr. Einar Pheasant virtual on 4/25. Given Hycodan. Patient states she takes at night and on days she does not have dialyses. States that she ran out and would lie refill. It helps with cough and lets her sleep. She is still having cough congestion and loss of taste. Denies any fever nausea or vomiting, sob or chest pains. Does not feel like symptoms have gotten worse just has not had any improvement.

## 2020-10-05 NOTE — Telephone Encounter (Signed)
Spoke to pt. She appreciates the cough syrup.

## 2020-10-05 NOTE — Telephone Encounter (Signed)
Please let her know that it can take a few weeks, or more, to fully recover from a COVID infection. I did refill the cough syrup for her. She should let us know if she gets worse--like having more trouble breathing

## 2020-10-07 DIAGNOSIS — N186 End stage renal disease: Secondary | ICD-10-CM | POA: Diagnosis not present

## 2020-10-07 DIAGNOSIS — Z992 Dependence on renal dialysis: Secondary | ICD-10-CM | POA: Diagnosis not present

## 2020-10-10 DIAGNOSIS — Z992 Dependence on renal dialysis: Secondary | ICD-10-CM | POA: Diagnosis not present

## 2020-10-10 DIAGNOSIS — N186 End stage renal disease: Secondary | ICD-10-CM | POA: Diagnosis not present

## 2020-10-11 ENCOUNTER — Other Ambulatory Visit (INDEPENDENT_AMBULATORY_CARE_PROVIDER_SITE_OTHER): Payer: Self-pay | Admitting: Nurse Practitioner

## 2020-10-11 DIAGNOSIS — T829XXS Unspecified complication of cardiac and vascular prosthetic device, implant and graft, sequela: Secondary | ICD-10-CM

## 2020-10-11 DIAGNOSIS — N186 End stage renal disease: Secondary | ICD-10-CM

## 2020-10-12 DIAGNOSIS — Z992 Dependence on renal dialysis: Secondary | ICD-10-CM | POA: Diagnosis not present

## 2020-10-12 DIAGNOSIS — N186 End stage renal disease: Secondary | ICD-10-CM | POA: Diagnosis not present

## 2020-10-13 ENCOUNTER — Ambulatory Visit (INDEPENDENT_AMBULATORY_CARE_PROVIDER_SITE_OTHER): Payer: Medicare HMO

## 2020-10-13 ENCOUNTER — Ambulatory Visit (INDEPENDENT_AMBULATORY_CARE_PROVIDER_SITE_OTHER): Payer: Medicare HMO | Admitting: Nurse Practitioner

## 2020-10-13 ENCOUNTER — Encounter (INDEPENDENT_AMBULATORY_CARE_PROVIDER_SITE_OTHER): Payer: Self-pay | Admitting: Nurse Practitioner

## 2020-10-13 ENCOUNTER — Other Ambulatory Visit: Payer: Self-pay

## 2020-10-13 VITALS — BP 187/77 | HR 85 | Resp 16 | Wt 268.4 lb

## 2020-10-13 DIAGNOSIS — E1121 Type 2 diabetes mellitus with diabetic nephropathy: Secondary | ICD-10-CM

## 2020-10-13 DIAGNOSIS — T829XXS Unspecified complication of cardiac and vascular prosthetic device, implant and graft, sequela: Secondary | ICD-10-CM

## 2020-10-13 DIAGNOSIS — I10 Essential (primary) hypertension: Secondary | ICD-10-CM | POA: Diagnosis not present

## 2020-10-13 DIAGNOSIS — N186 End stage renal disease: Secondary | ICD-10-CM

## 2020-10-14 DIAGNOSIS — N186 End stage renal disease: Secondary | ICD-10-CM | POA: Diagnosis not present

## 2020-10-14 DIAGNOSIS — Z992 Dependence on renal dialysis: Secondary | ICD-10-CM | POA: Diagnosis not present

## 2020-10-16 ENCOUNTER — Encounter (INDEPENDENT_AMBULATORY_CARE_PROVIDER_SITE_OTHER): Payer: Self-pay | Admitting: Nurse Practitioner

## 2020-10-16 NOTE — Progress Notes (Signed)
Subjective:    Patient ID: Carrie [name], female    DOB: 01/15/1951, 70 y.o.   MRN: [id number] Chief Complaint  Patient presents with  . Follow-up    Ref [place] difficult cannulation    Carrie [name] is a 70 year old female returns to the office for followup of their dialysis access. The function of the access has been stable. The patient denies increased bleeding time or increased recirculation. Patient denies difficulty with cannulation consistently, however she notes that occasionally 1 or 2 dialysis techs may have issues with access.  The patient denies hand pain or other symptoms consistent with steal phenomena.  No significant arm swelling.  The patient denies redness or swelling at the access site. The patient denies fever or chills at home or while on dialysis.  The patient denies amaurosis fugax or recent TIA symptoms. There are no recent neurological changes noted. The patient denies claudication symptoms or rest pain symptoms. The patient denies history of DVT, PE or superficial thrombophlebitis. The patient denies recent episodes of angina or shortness of breath.    Today the patient has a flow volume of 1033.  There are no areas of hemodynamically significant stenosis.  The previously placed stents are open and patent.     Review of Systems  Hematological: Does not bruise/bleed easily.  All other systems reviewed and are negative.      Objective:   Physical Exam Vitals reviewed.  HENT:     Head: Normocephalic.  Cardiovascular:     Rate and Rhythm: Normal rate.     Pulses: Normal pulses.     Arteriovenous access: left arteriovenous access is present.    Comments: Left brachial axillary AV graft with good thrill and bruit Pulmonary:     Effort: Pulmonary effort is normal.  Skin:    General: Skin is warm and dry.  Neurological:     Mental Status: She is alert and oriented to person, place, and time.  Psychiatric:        Mood and Affect: Mood normal.         Behavior: Behavior normal.        Thought Content: Thought content normal.        Judgment: Judgment normal.     BP (!) 187/77 (BP Location: Right Arm)   Pulse 85   Resp 16   Wt 268 lb 6.4 oz (121.7 kg)   BMI 47.54 kg/m   Past Medical History:  Diagnosis Date  . Allergy   . Anemia   . Arthritis    "right knee" (10/26'/2017)  . CKD (chronic kidney disease), stage III ([place])    12/2015 (Dr. Lavonia Dana)  . Depression   . History of blood transfusion 2008   "when I had brain tumor surgery"  . History of MRSA infection 2008  . Hypertension   . 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 Type II diabetes mellitus ([place]) 2011   on metformin until yr ago- no med now - off due to kidneys    Social History   Socioeconomic History  . Marital status: Widowed    Spouse name: Not on file  . Number of children: 1  . Years of education: Not on file  . Highest education level: Not on file  Occupational History  . 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 Occupation: Disabled since brain surgery  Tobacco Use  . Smoking status: Never Smoker  . Smokeless tobacco: Never Used  Vaping Use  .  Vaping Use: Never used  Substance and Sexual Activity  . Alcohol use: No    Alcohol/week: 0.0 standard drinks  . Drug use: No  . Sexual activity: Never  Other Topics Concern  . Not on file  Social History Narrative   Lives in family home with extended family.      No living will   Requests niece, [name], as health care POA   Would accept resuscitation attempts   No tube feeds if cognitively aware   Social Determinants of Health   Financial Resource Strain: Not on file  Food Insecurity: Not on file  Transportation Needs: Not on file  Physical Activity: Not on file  Stress: Not on file  Social Connections: Not on file  Intimate Partner Violence: Not on file    Past Surgical History:  Procedure Laterality Date  . A/V FISTULAGRAM Left 03/10/2019   Procedure: A/V  FISTULAGRAM;  Surgeon: Katha Cabal, MD;  Location: [facility];  Service: Cardiovascular;  Laterality: Left;  . A/V FISTULAGRAM Left 07/14/2019   Procedure: A/V FISTULAGRAM;  Surgeon: Katha Cabal, MD;  Location: [facility];  Service: Cardiovascular;  Laterality: Left;  . A/V FISTULAGRAM Left 01/07/2020   Procedure: A/V FISTULAGRAM;  Surgeon: Katha Cabal, MD;  Location: [facility];  Service: Cardiovascular;  Laterality: Left;  . AV FISTULA PLACEMENT Left 11/07/2018   Procedure: ARTERIOVENOUS (AV) FISTULA CREATION ( BRACHIO BASILIC ) VS BRACHIAL AXILLARY GRAFT;  Surgeon: Katha Cabal, MD;  Location: [facility];  Service: Vascular;  Laterality: Left;  . BRAIN MENINGIOMA EXCISION  08/2006   Foramina magnum meningioma  . CERVICAL DISCECTOMY  1996   Dr. Alric Seton  . CESAREAN SECTION  1981  . COLONOSCOPY    . DIALYSIS/PERMA CATHETER INSERTION N/A 08/04/2018   Procedure: DIALYSIS/PERMA CATHETER INSERTION;  Surgeon: Algernon Huxley, MD;  Location: [facility];  Service: Cardiovascular;  Laterality: N/A;  . DIALYSIS/PERMA CATHETER REMOVAL N/A 12/25/2018   Procedure: DIALYSIS/PERMA CATHETER REMOVAL;  Surgeon: Algernon Huxley, MD;  Location: [facility];  Service: Cardiovascular;  Laterality: N/A;  . DILATION AND CURETTAGE OF UTERUS    . JOINT REPLACEMENT     right knee left hip  . Right wrist x-ray  2007   Deg. at 1st MCP  . TOTAL HIP ARTHROPLASTY Left 03/27/2016   Procedure: LEFT TOTAL HIP ARTHROPLASTY ANTERIOR APPROACH;  Surgeon: Mcarthur Rossetti, MD;  Location: [facility];  Service: Orthopedics;  Laterality: Left;  . TOTAL KNEE ARTHROPLASTY Right 04/30/2017   Procedure: RIGHT TOTAL KNEE ARTHROPLASTY;  Surgeon: Mcarthur Rossetti, MD;  Location: [facility];  Service: Orthopedics;  Laterality: Right;  . TUBAL LIGATION  1981    Family History  Problem Relation Age of Onset  . Hypertension Other        Strong family history   . Breast  cancer Mother 68  . Heart failure Sister   . Heart disease Sister     Allergies  Allergen Reactions  . Naproxen Sodium Anaphylaxis and Swelling  . Sulfamethoxazole-Trimethoprim Anaphylaxis and Swelling    CBC Latest Ref Rng & Units 11/07/2018 11/04/2018 08/06/2018  WBC 4.0 - 10.5 K/uL - 5.8 6.9  Hemoglobin 12.0 - 15.0 g/dL 13.3 11.8(L) 8.1(L)  Hematocrit 36.0 - 46.0 % 39.0 38.1 26.5(L)  Platelets 150 - 400 K/uL - 278 241      CMP     Component Value Date/Time   NA 137 11/07/2018 0641  K 4.6 01/07/2020 1140   CL 99 11/04/2018 1208   CO2 28 11/04/2018 1208   GLUCOSE 109 (H) 11/07/2018 0641   BUN 36 (H) 11/04/2018 1208   BUN 36 (A) 07/08/2017 0000   CREATININE 5.46 (H) 11/04/2018 1208   CALCIUM 9.0 11/04/2018 1208   PROT 8.5 (H) 12/20/2017 1145   ALBUMIN 3.5 08/06/2018 1422   AST 13 12/20/2017 1145   ALT 10 12/20/2017 1145   ALKPHOS 137 (H) 12/20/2017 1145   BILITOT 0.3 12/20/2017 1145   GFRNONAA 7 (L) 11/04/2018 1208   GFRAA 9 (L) 11/04/2018 1208     No results found.     Assessment & Plan:   1. ESRD (end stage renal disease) ([place]) Recommend:  The patient is doing well and currently has adequate dialysis access. The patient's dialysis center is not reporting any access issues. Flow pattern is stable when compared to the prior ultrasound.  The patient should have a duplex ultrasound of the dialysis access in 6 months. The patient will follow-up with me in the office after each ultrasound     2. Controlled type 2 diabetes mellitus with diabetic nephropathy, without long-term current use of insulin (HCC) Continue hypoglycemic medications as already ordered, these medications have been reviewed and there are no changes at this time.  Hgb A1C to be monitored as already arranged by primary service   3. Essential hypertension, benign Continue antihypertensive medications as already ordered, these medications have been reviewed and there are no changes at this  time.    Current Outpatient Medications on File Prior to Visit  Medication Sig Dispense Refill  . acetaminophen (TYLENOL) 500 MG tablet Take 1,000 mg by mouth every 6 (six) hours as needed (for pain.).     [name] aspirin EC 81 MG tablet Take 81 mg by mouth daily.    [name] atorvastatin (LIPITOR) 40 MG tablet TAKE 40 MG BY MOUTH ONCE DAILY (Patient taking differently: Take 40 mg by mouth daily.) 90 tablet 3  . azithromycin (ZITHROMAX) 250 MG tablet Take 2 tablets (500 mg) today. Then take 1 tablet daily for the next 4 days. 6 tablet 0  . gabapentin (NEURONTIN) 300 MG capsule Take 1 capsule (300 mg total) by mouth 2 (two) times daily. And 2 capsules at bedtime 120 capsule 3  . HYDROcodone bit-homatropine (HYCODAN) 5-1.5 MG/5ML syrup Take 5 mLs by mouth at bedtime as needed for cough. 120 mL 0  . HYDROcodone-acetaminophen (NORCO/VICODIN) 5-325 MG tablet Take 1-2 tablets by mouth every 4 (four) hours as needed for moderate pain. 30 tablet 0  . HYDROcodone-homatropine (HYCODAN) 5-1.5 MG/5ML syrup Take 5 mLs by mouth every 8 (eight) hours as needed for cough. 120 mL 0  . levETIRAcetam (KEPPRA) 1000 MG tablet TAKE 1 TABLET BY MOUTH TWICE A DAY 180 tablet 3  . lidocaine-prilocaine (EMLA) cream Apply 1 application topically every Monday, Wednesday, and Friday with hemodialysis.    [name] LORazepam (ATIVAN) 0.5 MG tablet TAKE 1 TABLET BY MOUTH TWICE A DAY AS NEEDED FOR ANXIETY 60 tablet 0  . losartan (COZAAR) 25 MG tablet Take 25 mg by mouth every evening.     . metoprolol tartrate (LOPRESSOR) 25 MG tablet Take 25 mg by mouth every evening.     . multivitamin (RENA-VIT) TABS tablet Take 1 tablet by mouth at bedtime. 30 tablet 0  . sevelamer carbonate (RENVELA) 800 MG tablet Take 800-1,600 mg by mouth See admin instructions. Take 2 tablets (1600 mg) by mouth with each meal &  1 tablet (800 mg) by mouth with each snack    . torsemide (DEMADEX) 20 MG tablet Take 20 mg by mouth 4 (four) times a week. Take 1 tablet (20 mg) by  mouth in the morning on Tuesdays, Thursdays, Saturdays, & Sundays in the morning on non-dialysis days.    . valACYclovir (VALTREX) 1000 MG tablet Take 1 tablet (1,000 mg total) by mouth 3 (three) times daily. 20 tablet 0   No current facility-administered medications on file prior to visit.    There are no Patient Instructions on file for this visit. No follow-ups on file.   Kris Hartmann, NP

## 2020-10-17 DIAGNOSIS — N186 End stage renal disease: Secondary | ICD-10-CM | POA: Diagnosis not present

## 2020-10-17 DIAGNOSIS — Z992 Dependence on renal dialysis: Secondary | ICD-10-CM | POA: Diagnosis not present

## 2020-10-19 DIAGNOSIS — Z992 Dependence on renal dialysis: Secondary | ICD-10-CM | POA: Diagnosis not present

## 2020-10-19 DIAGNOSIS — N186 End stage renal disease: Secondary | ICD-10-CM | POA: Diagnosis not present

## 2020-10-21 DIAGNOSIS — N186 End stage renal disease: Secondary | ICD-10-CM | POA: Diagnosis not present

## 2020-10-21 DIAGNOSIS — Z992 Dependence on renal dialysis: Secondary | ICD-10-CM | POA: Diagnosis not present

## 2020-10-24 DIAGNOSIS — N186 End stage renal disease: Secondary | ICD-10-CM | POA: Diagnosis not present

## 2020-10-24 DIAGNOSIS — Z992 Dependence on renal dialysis: Secondary | ICD-10-CM | POA: Diagnosis not present

## 2020-10-25 ENCOUNTER — Other Ambulatory Visit: Payer: Self-pay

## 2020-10-25 ENCOUNTER — Encounter: Payer: Self-pay | Admitting: Internal Medicine

## 2020-10-25 ENCOUNTER — Ambulatory Visit (INDEPENDENT_AMBULATORY_CARE_PROVIDER_SITE_OTHER): Payer: Medicare HMO | Admitting: Internal Medicine

## 2020-10-25 DIAGNOSIS — H6980 Other specified disorders of Eustachian tube, unspecified ear: Secondary | ICD-10-CM | POA: Insufficient documentation

## 2020-10-25 DIAGNOSIS — H6981 Other specified disorders of Eustachian tube, right ear: Secondary | ICD-10-CM | POA: Diagnosis not present

## 2020-10-25 DIAGNOSIS — H6990 Unspecified Eustachian tube disorder, unspecified ear: Secondary | ICD-10-CM | POA: Insufficient documentation

## 2020-10-25 MED ORDER — PREDNISONE 20 MG PO TABS
40.0000 mg | ORAL_TABLET | Freq: Every day | ORAL | 0 refills | Status: DC
Start: 2020-10-25 — End: 2020-12-01

## 2020-10-25 NOTE — Progress Notes (Signed)
Subjective:    Patient ID: Carrie [name], female    DOB: [birth year], 70 y.o.   MRN: [id number]  HPI Here due to ear fullness--with sister This visit occurred during the SARS-CoV-2 public health emergency.  Safety protocols were in place, including screening questions prior to the visit, additional usage of staff PPE, and extensive cleaning of exam room while observing appropriate contact time as indicated for disinfecting solutions.   Had COVID a month ago Had "violent"coughing Did get oral therapy for 5 days Mostly better---taste and smell not back to normal yet No ear problems then--but it started a couple of weeks later Now having hearing problems---but no pain  Some tinnitus in right ear yesterday Tried some OTC drops in ear--no help  No sig rhinnorhea  Current Outpatient Medications on File Prior to Visit  Medication Sig Dispense Refill  . acetaminophen (TYLENOL) 500 MG tablet Take 1,000 mg by mouth every 6 (six) hours as needed (for pain.).     [name] aspirin EC 81 MG tablet Take 81 mg by mouth daily.    [name] atorvastatin (LIPITOR) 40 MG tablet TAKE 40 MG BY MOUTH ONCE DAILY (Patient taking differently: Take 40 mg by mouth daily.) 90 tablet 3  . levETIRAcetam (KEPPRA) 1000 MG tablet TAKE 1 TABLET BY MOUTH TWICE A DAY 180 tablet 3  . lidocaine-prilocaine (EMLA) cream Apply 1 application topically every Monday, Wednesday, and Friday with hemodialysis.    [name] LORazepam (ATIVAN) 0.5 MG tablet TAKE 1 TABLET BY MOUTH TWICE A DAY AS NEEDED FOR ANXIETY 60 tablet 0  . losartan (COZAAR) 25 MG tablet Take 25 mg by mouth every evening.     . metoprolol tartrate (LOPRESSOR) 25 MG tablet Take 25 mg by mouth every evening.     . multivitamin (RENA-VIT) TABS tablet Take 1 tablet by mouth at bedtime. 30 tablet 0  . sevelamer carbonate (RENVELA) 800 MG tablet Take 800-1,600 mg by mouth See admin instructions. Take 2 tablets (1600 mg) by mouth with each meal & 1 tablet (800 mg) by mouth with each snack     . torsemide (DEMADEX) 20 MG tablet Take 20 mg by mouth 4 (four) times a week. Take 1 tablet (20 mg) by mouth in the morning on Tuesdays, Thursdays, Saturdays, & Sundays in the morning on non-dialysis days.     No current facility-administered medications on file prior to visit.    Allergies  Allergen Reactions  . Naproxen Sodium Anaphylaxis and Swelling  . Sulfamethoxazole-Trimethoprim Anaphylaxis and Swelling    Past Medical History:  Diagnosis Date  . Allergy   . Anemia   . Arthritis    "right knee" (10/26'/2017)  . CKD (chronic kidney disease), stage III ([place])    12/2015 (Dr. Lavonia Dana)  . Depression   . History of blood transfusion 2008   "when I had brain tumor surgery"  . History of MRSA infection 2008  . Hypertension   . 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 Type II diabetes mellitus ([place]) 2011   on metformin until yr ago- no med now - off due to kidneys    Past Surgical History:  Procedure Laterality Date  . A/V FISTULAGRAM Left 03/10/2019   Procedure: A/V FISTULAGRAM;  Surgeon: Katha Cabal, MD;  Location: [facility];  Service: Cardiovascular;  Laterality: Left;  . A/V FISTULAGRAM Left 07/14/2019   Procedure: A/V FISTULAGRAM;  Surgeon: Katha Cabal, MD;  Location: [facility];  Service: Cardiovascular;  Laterality: Left;  . A/V FISTULAGRAM Left 01/07/2020   Procedure: A/V FISTULAGRAM;  Surgeon: Katha Cabal, MD;  Location: [facility];  Service: Cardiovascular;  Laterality: Left;  . AV FISTULA PLACEMENT Left 11/07/2018   Procedure: ARTERIOVENOUS (AV) FISTULA CREATION ( BRACHIO BASILIC ) VS BRACHIAL AXILLARY GRAFT;  Surgeon: Katha Cabal, MD;  Location: [facility];  Service: Vascular;  Laterality: Left;  . BRAIN MENINGIOMA EXCISION  08/2006   Foramina magnum meningioma  . CERVICAL DISCECTOMY  1996   Dr. Alric Seton  . CESAREAN SECTION  1981  . COLONOSCOPY    . DIALYSIS/PERMA CATHETER INSERTION N/A 08/04/2018    Procedure: DIALYSIS/PERMA CATHETER INSERTION;  Surgeon: Algernon Huxley, MD;  Location: [facility];  Service: Cardiovascular;  Laterality: N/A;  . DIALYSIS/PERMA CATHETER REMOVAL N/A 12/25/2018   Procedure: DIALYSIS/PERMA CATHETER REMOVAL;  Surgeon: Algernon Huxley, MD;  Location: [facility];  Service: Cardiovascular;  Laterality: N/A;  . DILATION AND CURETTAGE OF UTERUS    . JOINT REPLACEMENT     right knee left hip  . Right wrist x-ray  2007   Deg. at 1st MCP  . TOTAL HIP ARTHROPLASTY Left 03/27/2016   Procedure: LEFT TOTAL HIP ARTHROPLASTY ANTERIOR APPROACH;  Surgeon: Mcarthur Rossetti, MD;  Location: [facility];  Service: Orthopedics;  Laterality: Left;  . TOTAL KNEE ARTHROPLASTY Right 04/30/2017   Procedure: RIGHT TOTAL KNEE ARTHROPLASTY;  Surgeon: Mcarthur Rossetti, MD;  Location: [facility];  Service: Orthopedics;  Laterality: Right;  . TUBAL LIGATION  1981    Family History  Problem Relation Age of Onset  . Hypertension Other        Strong family history   . Breast cancer Mother 1  . Heart failure Sister   . Heart disease Sister     Social History   Socioeconomic History  . Marital status: Widowed    Spouse name: Not on file  . Number of children: 1  . Years of education: Not on file  . Highest education level: Not on file  Occupational History  . 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 Occupation: Disabled since brain surgery  Tobacco Use  . Smoking status: Never Smoker  . Smokeless tobacco: Never Used  Vaping Use  . Vaping Use: Never used  Substance and Sexual Activity  . Alcohol use: No    Alcohol/week: 0.0 standard drinks  . Drug use: No  . Sexual activity: Never  Other Topics Concern  . Not on file  Social History Narrative   Lives in family home with extended family.      No living will   Requests niece, [name], as health care POA   Would accept resuscitation attempts   No tube feeds if cognitively aware   Social  Determinants of Health   Financial Resource Strain: Not on file  Food Insecurity: Not on file  Transportation Needs: Not on file  Physical Activity: Not on file  Stress: Not on file  Social Connections: Not on file  Intimate Partner Violence: Not on file   Review of Systems Recent shingles on right leg--finally better (but it was bad) No recent travel    Objective:   Physical Exam Constitutional:      Appearance: Normal appearance.  HENT:     Ears:     Comments: Cerumen 1/2 filling left canal--TM looks normal Right canal is clear---TM is retracted but not inflamed Neurological:     Mental Status: She  is alert.            Assessment & Plan:

## 2020-10-25 NOTE — Assessment & Plan Note (Addendum)
No infection Will try brief course of prednisone----even if not effective, this should resolve on its own eventually Consider ENT if doesn't improve over time

## 2020-10-26 DIAGNOSIS — N186 End stage renal disease: Secondary | ICD-10-CM | POA: Diagnosis not present

## 2020-10-26 DIAGNOSIS — Z992 Dependence on renal dialysis: Secondary | ICD-10-CM | POA: Diagnosis not present

## 2020-10-28 DIAGNOSIS — N186 End stage renal disease: Secondary | ICD-10-CM | POA: Diagnosis not present

## 2020-10-28 DIAGNOSIS — Z992 Dependence on renal dialysis: Secondary | ICD-10-CM | POA: Diagnosis not present

## 2020-10-31 DIAGNOSIS — Z992 Dependence on renal dialysis: Secondary | ICD-10-CM | POA: Diagnosis not present

## 2020-10-31 DIAGNOSIS — N186 End stage renal disease: Secondary | ICD-10-CM | POA: Diagnosis not present

## 2020-11-01 DIAGNOSIS — Z992 Dependence on renal dialysis: Secondary | ICD-10-CM | POA: Diagnosis not present

## 2020-11-01 DIAGNOSIS — N186 End stage renal disease: Secondary | ICD-10-CM | POA: Diagnosis not present

## 2020-11-02 DIAGNOSIS — N186 End stage renal disease: Secondary | ICD-10-CM | POA: Diagnosis not present

## 2020-11-02 DIAGNOSIS — Z992 Dependence on renal dialysis: Secondary | ICD-10-CM | POA: Diagnosis not present

## 2020-11-04 DIAGNOSIS — N186 End stage renal disease: Secondary | ICD-10-CM | POA: Diagnosis not present

## 2020-11-04 DIAGNOSIS — Z992 Dependence on renal dialysis: Secondary | ICD-10-CM | POA: Diagnosis not present

## 2020-11-07 ENCOUNTER — Other Ambulatory Visit: Payer: Self-pay | Admitting: Internal Medicine

## 2020-11-07 DIAGNOSIS — N186 End stage renal disease: Secondary | ICD-10-CM | POA: Diagnosis not present

## 2020-11-07 DIAGNOSIS — Z992 Dependence on renal dialysis: Secondary | ICD-10-CM | POA: Diagnosis not present

## 2020-11-07 NOTE — Telephone Encounter (Signed)
Last filled 09-01-20 #60 Last OV 10-25-20 Acute Next OV 02-07-21 [facility]

## 2020-11-09 DIAGNOSIS — N186 End stage renal disease: Secondary | ICD-10-CM | POA: Diagnosis not present

## 2020-11-09 DIAGNOSIS — Z992 Dependence on renal dialysis: Secondary | ICD-10-CM | POA: Diagnosis not present

## 2020-11-10 ENCOUNTER — Other Ambulatory Visit: Payer: Self-pay

## 2020-11-10 ENCOUNTER — Encounter: Payer: Self-pay | Admitting: Physician Assistant

## 2020-11-10 ENCOUNTER — Ambulatory Visit (INDEPENDENT_AMBULATORY_CARE_PROVIDER_SITE_OTHER): Payer: Medicare HMO | Admitting: Physician Assistant

## 2020-11-10 VITALS — Ht 62.5 in | Wt 251.8 lb

## 2020-11-10 DIAGNOSIS — M1711 Unilateral primary osteoarthritis, right knee: Secondary | ICD-10-CM | POA: Diagnosis not present

## 2020-11-10 DIAGNOSIS — Z6841 Body Mass Index (BMI) 40.0 and over, adult: Secondary | ICD-10-CM

## 2020-11-10 DIAGNOSIS — E66813 Obesity, class 3: Secondary | ICD-10-CM

## 2020-11-10 NOTE — Progress Notes (Signed)
HPI: Mrs. Tieken returns today for her left knee osteoarthritis.  She is here mainly for weight check.  She states her left knee pain is getting worse.  She is using a walker to ambulate.  No new injury.  Again patient is on dialysis.  Physical exam: Height 5 foot 2.5 inches weight 251.8 pounds BMI 45.29 General well-developed well-nourished female no acute distress.   Impression: Left knee osteoarthritis Obesity  Plan: Discussed with patient that her BMI needs to be below 39.5.  She needs to lose down to approximately 219 pounds before we can proceed with surgery.  Discussed the risks that are involved with a higher BMI.  She is quite upset by this due to the fact that she has difficulty losing weight because she cannot exercise.  Recommend she talk to her nutritionist through dialysis about losing weight and see what can be done.  Offered referral to weight loss clinic but patient states she is unable to afford this.  Questions were encouraged and answered both the patient and her sister who is present today.  Follow-up in 3 months for weight check.

## 2020-11-11 DIAGNOSIS — N186 End stage renal disease: Secondary | ICD-10-CM | POA: Diagnosis not present

## 2020-11-11 DIAGNOSIS — Z992 Dependence on renal dialysis: Secondary | ICD-10-CM | POA: Diagnosis not present

## 2020-11-14 ENCOUNTER — Telehealth: Payer: Self-pay | Admitting: Orthopaedic Surgery

## 2020-11-14 DIAGNOSIS — Z992 Dependence on renal dialysis: Secondary | ICD-10-CM | POA: Diagnosis not present

## 2020-11-14 DIAGNOSIS — N186 End stage renal disease: Secondary | ICD-10-CM | POA: Diagnosis not present

## 2020-11-14 NOTE — Telephone Encounter (Signed)
Pt calling asking to be approved for a brace or anything of that nature to help her left knee pain. Pt states she can not do surgery per ins due to BMI. The best call back number is [phone].

## 2020-11-14 NOTE — Telephone Encounter (Signed)
Please advise 

## 2020-11-14 NOTE — Telephone Encounter (Signed)
[employer] order faxed. I called pt and informed. She stated understanding

## 2020-11-16 DIAGNOSIS — N186 End stage renal disease: Secondary | ICD-10-CM | POA: Diagnosis not present

## 2020-11-16 DIAGNOSIS — Z992 Dependence on renal dialysis: Secondary | ICD-10-CM | POA: Diagnosis not present

## 2020-11-18 DIAGNOSIS — Z992 Dependence on renal dialysis: Secondary | ICD-10-CM | POA: Diagnosis not present

## 2020-11-18 DIAGNOSIS — N186 End stage renal disease: Secondary | ICD-10-CM | POA: Diagnosis not present

## 2020-11-21 DIAGNOSIS — N186 End stage renal disease: Secondary | ICD-10-CM | POA: Diagnosis not present

## 2020-11-21 DIAGNOSIS — Z992 Dependence on renal dialysis: Secondary | ICD-10-CM | POA: Diagnosis not present

## 2020-11-23 DIAGNOSIS — N186 End stage renal disease: Secondary | ICD-10-CM | POA: Diagnosis not present

## 2020-11-23 DIAGNOSIS — Z992 Dependence on renal dialysis: Secondary | ICD-10-CM | POA: Diagnosis not present

## 2020-11-25 DIAGNOSIS — Z992 Dependence on renal dialysis: Secondary | ICD-10-CM | POA: Diagnosis not present

## 2020-11-25 DIAGNOSIS — N186 End stage renal disease: Secondary | ICD-10-CM | POA: Diagnosis not present

## 2020-11-30 DIAGNOSIS — Z992 Dependence on renal dialysis: Secondary | ICD-10-CM | POA: Diagnosis not present

## 2020-11-30 DIAGNOSIS — N186 End stage renal disease: Secondary | ICD-10-CM | POA: Diagnosis not present

## 2020-12-01 ENCOUNTER — Encounter: Payer: Self-pay | Admitting: Internal Medicine

## 2020-12-01 ENCOUNTER — Ambulatory Visit (INDEPENDENT_AMBULATORY_CARE_PROVIDER_SITE_OTHER): Payer: Medicare HMO | Admitting: Internal Medicine

## 2020-12-01 ENCOUNTER — Other Ambulatory Visit: Payer: Self-pay

## 2020-12-01 DIAGNOSIS — N186 End stage renal disease: Secondary | ICD-10-CM | POA: Diagnosis not present

## 2020-12-01 DIAGNOSIS — R251 Tremor, unspecified: Secondary | ICD-10-CM | POA: Diagnosis not present

## 2020-12-01 DIAGNOSIS — G8929 Other chronic pain: Secondary | ICD-10-CM

## 2020-12-01 DIAGNOSIS — R519 Headache, unspecified: Secondary | ICD-10-CM | POA: Insufficient documentation

## 2020-12-01 DIAGNOSIS — Z992 Dependence on renal dialysis: Secondary | ICD-10-CM | POA: Diagnosis not present

## 2020-12-01 NOTE — Assessment & Plan Note (Signed)
Not Parkinsonian No history in the family Not clear if this could be metabolic Just had blood work---will need to check TSH if not done No clear medications that are likely to cause this Would set up with neuro if this persists

## 2020-12-01 NOTE — Progress Notes (Signed)
Subjective:    Patient ID: Carrie [name], female    DOB: 17-Dec-1950, 70 y.o.   MRN: [id number]  HPI Here with sister due to shaking in hands This visit occurred during the SARS-CoV-2 public health emergency.  Safety protocols were in place, including screening questions prior to the visit, additional usage of staff PPE, and extensive cleaning of exam room while observing appropriate contact time as indicated for disinfecting solutions.   Hands shaking --started about a month ago Not related to dialysis  Can be anytime---will be intermittent Both hands---occasionally head bobs as well  Left frontal headache for a month or so as well Wonders if it is related to COVID infection Some coughing still with mucus Seems to some trouble with her saliva gland (due to mucus) Seems to be more common after dialysis  Using walker---due to knee pain  Slight tea--no other caffeine drinks  Current Outpatient Medications on File Prior to Visit  Medication Sig Dispense Refill   acetaminophen (TYLENOL) 500 MG tablet Take 1,000 mg by mouth every 6 (six) hours as needed (for pain.).      albuterol (VENTOLIN HFA) 108 (90 Base) MCG/ACT inhaler Inhale 2 puffs into the lungs as needed.     aspirin EC 81 MG tablet Take 81 mg by mouth daily.     atorvastatin (LIPITOR) 40 MG tablet TAKE 40 MG BY MOUTH ONCE DAILY (Patient taking differently: Take 40 mg by mouth daily.) 90 tablet 3   levETIRAcetam (KEPPRA) 1000 MG tablet TAKE 1 TABLET BY MOUTH TWICE A DAY 180 tablet 3   lidocaine-prilocaine (EMLA) cream Apply 1 application topically every Monday, Wednesday, and Friday with hemodialysis.     LORazepam (ATIVAN) 0.5 MG tablet TAKE 1 TABLET BY MOUTH TWICE A DAY AS NEEDED FOR ANXIETY 60 tablet 0   losartan (COZAAR) 25 MG tablet Take 25 mg by mouth every evening.      metoprolol tartrate (LOPRESSOR) 25 MG tablet Take 25 mg by mouth every evening.      multivitamin (RENA-VIT) TABS tablet Take 1 tablet by mouth at  bedtime. 30 tablet 0   sevelamer carbonate (RENVELA) 800 MG tablet Take 800-1,600 mg by mouth See admin instructions. Take 2 tablets (1600 mg) by mouth with each meal & 1 tablet (800 mg) by mouth with each snack     torsemide (DEMADEX) 20 MG tablet Take 20 mg by mouth 4 (four) times a week. Take 1 tablet (20 mg) by mouth in the morning on Tuesdays, Thursdays, Saturdays, & Sundays in the morning on non-dialysis days.     No current facility-administered medications on file prior to visit.    Allergies  Allergen Reactions   Naproxen Sodium Anaphylaxis and Swelling   Sulfamethoxazole-Trimethoprim Anaphylaxis and Swelling    Past Medical History:  Diagnosis Date   Allergy    Anemia    Arthritis    "right knee" (10/26'/2017)   CKD (chronic kidney disease), stage III ([place])    12/2015 (Dr. 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   Type II diabetes mellitus ([place]) 2011   on metformin until yr ago- no med now - off due to kidneys    Past Surgical History:  Procedure Laterality Date   A/V FISTULAGRAM Left 03/10/2019   Procedure: A/V FISTULAGRAM;  Surgeon: Delana Meyer,  Dolores Lory, MD;  Location: [facility];  Service: Cardiovascular;  Laterality: Left;   A/V FISTULAGRAM Left 07/14/2019   Procedure: A/V FISTULAGRAM;  Surgeon: Katha Cabal, MD;  Location: [facility];  Service: Cardiovascular;  Laterality: Left;   A/V FISTULAGRAM Left 01/07/2020   Procedure: A/V FISTULAGRAM;  Surgeon: Katha Cabal, MD;  Location: [facility];  Service: Cardiovascular;  Laterality: Left;   AV FISTULA PLACEMENT Left 11/07/2018   Procedure: ARTERIOVENOUS (AV) FISTULA CREATION ( BRACHIO BASILIC ) VS BRACHIAL AXILLARY GRAFT;  Surgeon: Katha Cabal, MD;  Location: [facility];  Service: Vascular;  Laterality: Left;   BRAIN MENINGIOMA  EXCISION  08/2006   Foramina magnum meningioma   CERVICAL DISCECTOMY  1996   Dr. Alric Seton   CESAREAN [place]   COLONOSCOPY     DIALYSIS/PERMA CATHETER INSERTION N/A 08/04/2018   Procedure: DIALYSIS/PERMA CATHETER INSERTION;  Surgeon: Algernon Huxley, MD;  Location: [facility];  Service: Cardiovascular;  Laterality: N/A;   DIALYSIS/PERMA CATHETER REMOVAL N/A 12/25/2018   Procedure: DIALYSIS/PERMA CATHETER REMOVAL;  Surgeon: Algernon Huxley, MD;  Location: [facility];  Service: Cardiovascular;  Laterality: N/A;   DILATION AND CURETTAGE OF UTERUS     JOINT REPLACEMENT     right knee left hip   Right wrist x-ray  2007   Deg. at 1st MCP   TOTAL HIP ARTHROPLASTY Left 03/27/2016   Procedure: LEFT TOTAL HIP ARTHROPLASTY ANTERIOR APPROACH;  Surgeon: Mcarthur Rossetti, MD;  Location: [facility];  Service: Orthopedics;  Laterality: Left;   TOTAL KNEE ARTHROPLASTY Right 04/30/2017   Procedure: RIGHT TOTAL KNEE ARTHROPLASTY;  Surgeon: Mcarthur Rossetti, MD;  Location: [facility];  Service: Orthopedics;  Laterality: Right;   TUBAL LIGATION  1981    Family History  Problem Relation Age of Onset   Hypertension Other        Strong family history    Breast cancer Mother 34   Heart failure Sister    Heart disease Sister     Social History   Socioeconomic History   Marital status: Widowed    Spouse name: Not on file   Number of children: 1   Years of education: Not on file   Highest education level: Not on file  Occupational History   Occupation: Formerly Psychologist, forensic, 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      No  living will   Requests niece, [name], as health care POA   Would accept resuscitation attempts   No tube feeds if cognitively aware   Social Determinants of Health   Financial Resource Strain: Not on file  Food Insecurity: Not on file  Transportation Needs: Not on file  Physical Activity: Not on file  Stress: Not on file  Social Connections: Not on file  Intimate Partner Violence: Not on file   Review of [place] 3 days ago--no injury Sleeping pretty good No problems with appetite Some anxiety--not a big deal    Objective:   Physical Exam Constitutional:  Appearance: Normal appearance.  Cardiovascular:     Rate and Rhythm: Normal rate and regular rhythm.     Heart sounds: No murmur heard.   No gallop.  Pulmonary:     Effort: Pulmonary effort is normal.     Breath sounds: Normal breath sounds. No wheezing or rales.  Neurological:     Mental Status: She is alert.     Comments: Mild resting tremor in hands No bradykinesia (quick with finger to nose) Gait is slow but not Parkinsonian  Psychiatric:        Mood and Affect: Mood normal.        Behavior: Behavior normal.           Assessment & Plan:

## 2020-12-01 NOTE — Assessment & Plan Note (Signed)
Mostly after dialysis Also with muscle cramps No indication that she has increased ICP, etc

## 2020-12-02 ENCOUNTER — Telehealth: Payer: Self-pay | Admitting: *Deleted

## 2020-12-02 DIAGNOSIS — N186 End stage renal disease: Secondary | ICD-10-CM | POA: Diagnosis not present

## 2020-12-02 DIAGNOSIS — Z992 Dependence on renal dialysis: Secondary | ICD-10-CM | POA: Diagnosis not present

## 2020-12-02 NOTE — Chronic Care Management (AMB) (Signed)
  Chronic Care Management   Note  12/02/2020 Name: MIKEALA GIRDLER MRN: [id number] DOB: 07-May-1951  Golden Hurter Byrum is a 70 y.o. year old female who is a primary care patient of Venia Carbon, MD. I reached out to [place] by phone today in response to a referral sent by Ms. Golden Hurter Gisler's PCP Venia Carbon, MD     Ms. Schatzman was given information about Chronic Care Management services today including:  CCM service includes personalized support from designated clinical staff supervised by her physician, including individualized plan of care and coordination with other care providers 24/7 contact phone numbers for assistance for urgent and routine care needs. Service will only be billed when office clinical staff spend 20 minutes or more in a month to coordinate care. Only one practitioner may furnish and bill the service in a calendar month. The patient may stop CCM services at any time (effective at the end of the month) by phone call to the office staff. The patient will be responsible for cost sharing (co-pay) of up to 20% of the service fee (after annual deductible is met).  Patient agreed to services and verbal consent obtained.   Follow up plan: Telephone appointment with care management team member scheduled for:12/06/2020  Julian Hy, [place] Management  Direct Dial: [phone]

## 2020-12-05 DIAGNOSIS — N186 End stage renal disease: Secondary | ICD-10-CM | POA: Diagnosis not present

## 2020-12-05 DIAGNOSIS — Z992 Dependence on renal dialysis: Secondary | ICD-10-CM | POA: Diagnosis not present

## 2020-12-06 ENCOUNTER — Ambulatory Visit: Payer: Medicare HMO

## 2020-12-06 DIAGNOSIS — I5032 Chronic diastolic (congestive) heart failure: Secondary | ICD-10-CM

## 2020-12-06 DIAGNOSIS — I10 Essential (primary) hypertension: Secondary | ICD-10-CM

## 2020-12-06 DIAGNOSIS — N186 End stage renal disease: Secondary | ICD-10-CM

## 2020-12-06 DIAGNOSIS — E1121 Type 2 diabetes mellitus with diabetic nephropathy: Secondary | ICD-10-CM

## 2020-12-06 NOTE — Patient Instructions (Signed)
Visit Information:  Thank you for taking the time to speak with me today  PATIENT GOALS:   Goals Addressed             This Visit's Progress    Track and Manage My Blood Pressure-Hypertension   On track    Timeframe:  Long-Range Goal Priority:  High Start Date:   12/06/2020                          Expected End Date:   03/03/2021                    Follow Up Date 01/05/2021    - check blood pressure at least 3 times per week - write blood pressure results in a log or diary (take log to your appointments to share information with your provider) - Follow fluid restrictions guidelines as recommended by your provider - Call your provider for new or ongoing symptoms. - Take your medications as prescribed.  - Follow a low sodium/ DASH diet  (review education information on Low sodium / DASH diet sent to you in MyChart by your RN nurse case manager).   Why is this important?   You won't feel high blood pressure, but it can still hurt your blood vessels.  High blood pressure can cause heart or kidney problems. It can also cause a stroke.  Making lifestyle changes like losing a little weight or eating less salt will help.  Checking your blood pressure at home and at different times of the day can help to control blood pressure.  If the doctor prescribes medicine remember to take it the way the doctor ordered.  Call the office if you cannot afford the medicine or if there are questions about it.              Ms. Entsminger was given information about Care Management services by the embedded care coordination team including:  Care Management services include personalized support from designated clinical staff supervised by her physician, including individualized plan of care and coordination with other care providers 24/7 contact phone numbers for assistance for urgent and routine care needs. The patient may stop CCM services at any time (effective at the end of the month) by phone call to  the office staff.  Patient agreed to services and verbal consent obtained.   Patient verbalizes understanding of instructions provided today and agrees to view in [place].   The patient has been provided with contact information for the care management team and has been advised to call with any health related questions or concerns.  The care management team will reach out to the patient again over the next 45 days.   Quinn Plowman RN,BSN,CCM RN Case Manager [place]  [phone]

## 2020-12-06 NOTE — Chronic Care Management (AMB) (Signed)
Care Management    RN Visit Note  12/06/2020 Name: Carrie [name] MRN: [id number] DOB: 08-15-1950  Subjective: Carrie [name] is a 70 y.o. year old female who is a primary care patient of Carrie Carbon, MD. The care management team was consulted for assistance with disease management and care coordination needs.    Engaged with patient by telephone for initial visit in response to provider referral for case management and/or care coordination services.   Consent to Services:   Carrie [name] was given information about Care Management services today including:  Care Management services includes personalized support from designated clinical staff supervised by her physician, including individualized plan of care and coordination with other care providers 24/7 contact phone numbers for assistance for urgent and routine care needs. The patient may stop case management services at any time by phone call to the office staff.  Patient agreed to services and consent obtained.   Assessment: Review of patient past medical history, allergies, medications, health status, including review of consultants reports, laboratory and other test data, was performed as part of comprehensive evaluation and provision of chronic care management services.   SDOH (Social Determinants of Health) assessments and interventions performed:  SDOH Interventions    Flowsheet Row Most Recent Value  SDOH Interventions   Food Insecurity Interventions Intervention Not Indicated  Financial Strain Interventions Intervention Not Indicated  Transportation Interventions Intervention Not Indicated        Care Plan  Allergies  Allergen Reactions   Naproxen Sodium Anaphylaxis and Swelling   Sulfamethoxazole-Trimethoprim Anaphylaxis and Swelling    Outpatient Encounter Medications as of 12/06/2020  Medication Sig   acetaminophen (TYLENOL) 500 MG tablet Take 1,000 mg by mouth every 6 (six) hours as needed (for pain.).     albuterol (VENTOLIN HFA) 108 (90 Base) MCG/ACT inhaler Inhale 2 puffs into the lungs as needed.   aspirin EC 81 MG tablet Take 81 mg by mouth daily.   atorvastatin (LIPITOR) 40 MG tablet TAKE 40 MG BY MOUTH ONCE DAILY (Patient taking differently: Take 40 mg by mouth daily.)   levETIRAcetam (KEPPRA) 1000 MG tablet TAKE 1 TABLET BY MOUTH TWICE A DAY   lidocaine-prilocaine (EMLA) cream Apply 1 application topically every Monday, Wednesday, and Friday with hemodialysis.   LORazepam (ATIVAN) 0.5 MG tablet TAKE 1 TABLET BY MOUTH TWICE A DAY AS NEEDED FOR ANXIETY   losartan (COZAAR) 25 MG tablet Take 25 mg by mouth every evening.    metoprolol tartrate (LOPRESSOR) 25 MG tablet Take 25 mg by mouth every evening.    multivitamin (RENA-VIT) TABS tablet Take 1 tablet by mouth at bedtime.   sevelamer carbonate (RENVELA) 800 MG tablet Take 800-1,600 mg by mouth See admin instructions. Take 2 tablets (1600 mg) by mouth with each meal & 1 tablet (800 mg) by mouth with each snack   torsemide (DEMADEX) 20 MG tablet Take 20 mg by mouth 4 (four) times a week. Take 1 tablet (20 mg) by mouth in the morning on Tuesdays, Thursdays, Saturdays, & Sundays in the morning on non-dialysis days.   No facility-administered encounter medications on file as of 12/06/2020.    Patient Active Problem List   Diagnosis Date Noted   Tremor 12/01/2020   Headache 12/01/2020   Eustachian tube dysfunction 10/25/2020   Viral URI with cough 09/26/2020   History of shingles 09/26/2020   Primary osteoarthritis of left knee 08/22/2020   Meningioma ([place]) 03/28/8526   Complication from renal dialysis device 11/20/2018  ESRD (end stage renal disease) ([place]) 08/04/2018   Chronic diastolic CHF (congestive heart failure) ([place]) 11/20/2017   Cough 10/01/2017   Status post total right knee replacement 04/30/2017   Unilateral primary osteoarthritis, right knee 03/18/2017   History of total hip replacement, left 03/18/2017   Unilateral  primary osteoarthritis, left hip 03/27/2016   Status post total replacement of left hip 03/27/2016   Mood disorder ([place]) 04/01/2015   Osteoarthritis of right knee 02/03/2014   Advanced directives, counseling/discussion 02/03/2014   Routine general medical examination at a health care facility 07/08/2012   BMI 40.0-44.9, adult ([place]) 07/08/2012   Type 2 diabetes, controlled, with renal manifestation ([place])    HYPERCHOLESTEROLEMIA 09/26/2006   Essential hypertension, benign 09/26/2006   ALLERGIC RHINITIS 09/26/2006   History of meningioma 08/03/2006    Conditions to be addressed/monitored: HTN  Care Plan : Hypertension (Adult)  Updates made by Carrie Karvonen, RN since 12/06/2020 12:00 AM   Problem: Knowledge deficit related to long term care plan for management of Hypertension in dialysis patient   Priority: High   Long-Range Goal: Hypertension monitored and managed   Start Date: 12/06/2020  Expected End Date: 03/03/2021  This Visit's Progress: On track  Priority: High  Objective:  Last practice recorded BP readings:  BP Readings from Last 3 Encounters:  12/01/20 (!) 150/98  10/25/20 140/90  10/13/20 (!) 187/77  Current Barriers:  Knowledge Deficits related to basic understanding of hypertension pathophysiology and self care management;  Patient with 40 plus year history of hypertension now with ESRD on dialysis.  Patient states her blood pressure ranges from 120/50's to 181/100.  She reports occasional headache symptoms. Patient states she continues to take her medications as prescribed.  She denies any recent change in medications.  She states she checks her blood pressure on her non dialysis days at home.  She states she tries to adhere to a low salt diet. Patient states she meets with the dialysis center dietician regularly and has requested to keep a food diary.  She report her consistency with keeping the food diary needs to improve.  Patient reports having family support as needed.   Case Manager Clinical Goal(s):  patient will verbalize understanding of plan for hypertension management patient will attend scheduled medical appointments: patient will demonstrate improved health management independence as evidenced by checking blood pressure as directed and notifying her provider for elevated or low readings, taking all medications as prescribed, and adhering to a low sodium /DASH diet as discussed. Interventions:  Collaboration with Carrie Carbon, MD regarding development and update of comprehensive plan of care as evidenced by provider attestation and co-signature Inter-disciplinary care team collaboration (see longitudinal plan of care) Evaluation of current treatment plan related to hypertension self management and patient's adherence to plan as established by provider. Provided education to patient re: stroke prevention, s/s of heart attack and stroke,  low salt/ DASH diet, complications of uncontrolled blood pressure Reviewed medications with patient and discussed importance of compliance Discussed plans with patient for ongoing care management follow up and provided patient with direct contact information for care management team Advised patient, providing education and rationale, to monitor blood pressure daily and record, calling PCP for findings outside established parameters.  Reviewed scheduled/upcoming provider appointments :  Annual wellness visit scheduled with primary care provider on 02/07/2021.   Self-Care Activities: - Self administers medications as prescribed Attends all scheduled provider appointments Calls provider office for new concerns, questions, or BP outside discussed parameters Checks  BP and records as discussed Follows a low sodium diet/DASH diet Patient Goals: - check blood pressure at least 3 times per week - write blood pressure results in a log or diary (take log to your appointments to share information with your provider) - Follow  fluid restrictions guidelines as recommended by your provider - Call your provider for new or ongoing symptoms. - Take your medications as prescribed.  - Follow a low sodium/ DASH diet  (review education information on Low sodium / DASH diet sent to you in MyChart by your RN nurse case manager). Follow Up Plan: The patient has been provided with contact information for the care management team and has been advised to call with any health related questions or concerns.  The care management team will reach out to the patient again over the next 45 days.       Plan: The patient has been provided with contact information for the care management team and has been advised to call with any health related questions or concerns.  and The care management team will reach out to the patient again over the next 45 days.  Quinn Plowman RN,BSN,CCM RN Case Manager [place]  [phone]

## 2020-12-07 DIAGNOSIS — N186 End stage renal disease: Secondary | ICD-10-CM | POA: Diagnosis not present

## 2020-12-07 DIAGNOSIS — Z992 Dependence on renal dialysis: Secondary | ICD-10-CM | POA: Diagnosis not present

## 2020-12-09 DIAGNOSIS — N186 End stage renal disease: Secondary | ICD-10-CM | POA: Diagnosis not present

## 2020-12-09 DIAGNOSIS — Z992 Dependence on renal dialysis: Secondary | ICD-10-CM | POA: Diagnosis not present

## 2020-12-12 DIAGNOSIS — N186 End stage renal disease: Secondary | ICD-10-CM | POA: Diagnosis not present

## 2020-12-12 DIAGNOSIS — Z992 Dependence on renal dialysis: Secondary | ICD-10-CM | POA: Diagnosis not present

## 2020-12-14 DIAGNOSIS — Z992 Dependence on renal dialysis: Secondary | ICD-10-CM | POA: Diagnosis not present

## 2020-12-14 DIAGNOSIS — N186 End stage renal disease: Secondary | ICD-10-CM | POA: Diagnosis not present

## 2020-12-16 DIAGNOSIS — N186 End stage renal disease: Secondary | ICD-10-CM | POA: Diagnosis not present

## 2020-12-16 DIAGNOSIS — Z992 Dependence on renal dialysis: Secondary | ICD-10-CM | POA: Diagnosis not present

## 2020-12-16 DIAGNOSIS — M1712 Unilateral primary osteoarthritis, left knee: Secondary | ICD-10-CM | POA: Diagnosis not present

## 2020-12-19 DIAGNOSIS — N186 End stage renal disease: Secondary | ICD-10-CM | POA: Diagnosis not present

## 2020-12-19 DIAGNOSIS — Z992 Dependence on renal dialysis: Secondary | ICD-10-CM | POA: Diagnosis not present

## 2020-12-21 DIAGNOSIS — Z992 Dependence on renal dialysis: Secondary | ICD-10-CM | POA: Diagnosis not present

## 2020-12-21 DIAGNOSIS — N186 End stage renal disease: Secondary | ICD-10-CM | POA: Diagnosis not present

## 2020-12-23 DIAGNOSIS — Z992 Dependence on renal dialysis: Secondary | ICD-10-CM | POA: Diagnosis not present

## 2020-12-23 DIAGNOSIS — N186 End stage renal disease: Secondary | ICD-10-CM | POA: Diagnosis not present

## 2020-12-26 DIAGNOSIS — N186 End stage renal disease: Secondary | ICD-10-CM | POA: Diagnosis not present

## 2020-12-26 DIAGNOSIS — Z992 Dependence on renal dialysis: Secondary | ICD-10-CM | POA: Diagnosis not present

## 2020-12-28 DIAGNOSIS — Z992 Dependence on renal dialysis: Secondary | ICD-10-CM | POA: Diagnosis not present

## 2020-12-28 DIAGNOSIS — H00011 Hordeolum externum right upper eyelid: Secondary | ICD-10-CM | POA: Diagnosis not present

## 2020-12-28 DIAGNOSIS — N186 End stage renal disease: Secondary | ICD-10-CM | POA: Diagnosis not present

## 2020-12-29 ENCOUNTER — Other Ambulatory Visit: Payer: Self-pay | Admitting: Internal Medicine

## 2020-12-29 MED ORDER — LORAZEPAM 0.5 MG PO TABS: 0.5000 mg | ORAL_TABLET | Freq: Two times a day (BID) | ORAL | 0 refills | Status: DC | PRN

## 2020-12-29 NOTE — Telephone Encounter (Signed)
  LAST APPOINTMENT DATE:  Please schedule appointment if longer than 1 year  NEXT APPOINTMENT DATE:@8 /09/2020  MEDICATION: LORazepam (ATIVAN) 0.5 MG tablet   Is the patient out of medication? yes  PH[facility]AC[facility] [address]  Let patient know to contact pharmacy at the end of the day to make sure medication is ready.  Please notify patient to allow 48-72 hours to process  Encourage patient to contact the pharmacy for refills or they can request refills through [place]:   LAST REFILL:  QTY:  REFILL DATE:    OTHER COMMENTS:    Okay for refill?  Please advise

## 2020-12-29 NOTE — Telephone Encounter (Signed)
Last filled 11-08-20 #60 Last OV 12-01-20 Next OV 02-07-21 [facility]

## 2020-12-30 DIAGNOSIS — N186 End stage renal disease: Secondary | ICD-10-CM | POA: Diagnosis not present

## 2020-12-30 DIAGNOSIS — Z992 Dependence on renal dialysis: Secondary | ICD-10-CM | POA: Diagnosis not present

## 2021-01-01 DIAGNOSIS — Z992 Dependence on renal dialysis: Secondary | ICD-10-CM | POA: Diagnosis not present

## 2021-01-01 DIAGNOSIS — N186 End stage renal disease: Secondary | ICD-10-CM | POA: Diagnosis not present

## 2021-01-02 DIAGNOSIS — Z992 Dependence on renal dialysis: Secondary | ICD-10-CM | POA: Diagnosis not present

## 2021-01-02 DIAGNOSIS — N186 End stage renal disease: Secondary | ICD-10-CM | POA: Diagnosis not present

## 2021-01-04 DIAGNOSIS — N186 End stage renal disease: Secondary | ICD-10-CM | POA: Diagnosis not present

## 2021-01-04 DIAGNOSIS — E119 Type 2 diabetes mellitus without complications: Secondary | ICD-10-CM | POA: Diagnosis not present

## 2021-01-04 DIAGNOSIS — Z794 Long term (current) use of insulin: Secondary | ICD-10-CM | POA: Diagnosis not present

## 2021-01-04 DIAGNOSIS — Z992 Dependence on renal dialysis: Secondary | ICD-10-CM | POA: Diagnosis not present

## 2021-01-05 ENCOUNTER — Telehealth: Payer: Medicare HMO

## 2021-01-06 DIAGNOSIS — Z992 Dependence on renal dialysis: Secondary | ICD-10-CM | POA: Diagnosis not present

## 2021-01-06 DIAGNOSIS — N186 End stage renal disease: Secondary | ICD-10-CM | POA: Diagnosis not present

## 2021-01-09 DIAGNOSIS — Z992 Dependence on renal dialysis: Secondary | ICD-10-CM | POA: Diagnosis not present

## 2021-01-09 DIAGNOSIS — N186 End stage renal disease: Secondary | ICD-10-CM | POA: Diagnosis not present

## 2021-01-11 DIAGNOSIS — Z992 Dependence on renal dialysis: Secondary | ICD-10-CM | POA: Diagnosis not present

## 2021-01-11 DIAGNOSIS — N186 End stage renal disease: Secondary | ICD-10-CM | POA: Diagnosis not present

## 2021-01-13 DIAGNOSIS — N186 End stage renal disease: Secondary | ICD-10-CM | POA: Diagnosis not present

## 2021-01-13 DIAGNOSIS — Z992 Dependence on renal dialysis: Secondary | ICD-10-CM | POA: Diagnosis not present

## 2021-01-16 DIAGNOSIS — N186 End stage renal disease: Secondary | ICD-10-CM | POA: Diagnosis not present

## 2021-01-16 DIAGNOSIS — Z992 Dependence on renal dialysis: Secondary | ICD-10-CM | POA: Diagnosis not present

## 2021-01-18 DIAGNOSIS — N186 End stage renal disease: Secondary | ICD-10-CM | POA: Diagnosis not present

## 2021-01-18 DIAGNOSIS — Z992 Dependence on renal dialysis: Secondary | ICD-10-CM | POA: Diagnosis not present

## 2021-01-19 ENCOUNTER — Telehealth: Payer: Self-pay | Admitting: Internal Medicine

## 2021-01-19 ENCOUNTER — Ambulatory Visit (INDEPENDENT_AMBULATORY_CARE_PROVIDER_SITE_OTHER): Payer: Medicare HMO

## 2021-01-19 DIAGNOSIS — I10 Essential (primary) hypertension: Secondary | ICD-10-CM

## 2021-01-19 DIAGNOSIS — N186 End stage renal disease: Secondary | ICD-10-CM

## 2021-01-19 NOTE — Telephone Encounter (Signed)
Normal readings before going in. While she is there her BP goes up. At times it has been over 200/100. She usually does not take her medications while doing her dialysis. She tried taking metoprolol at dialysis but it did not seem to help. This morning her BP was 199/93. Has head pain on left side. She is taking metoprolol and losartan 25 mg daily.

## 2021-01-19 NOTE — Patient Instructions (Signed)
Visit Information: Thank you for taking the time to speak with me today.    Goals Addressed             This Visit's Progress    Track and Manage My Blood Pressure-Hypertension   Not on track    Timeframe:  Long-Range Goal Priority:  High Start Date:   12/06/2020                          Expected End Date:   05/03/2021                   Follow Up Date 01/26/2021   - Continue to check blood pressure at least 3 times per week - write blood pressure results in a log or diary (take log to your appointments to share information with your provider) - Continue to follow fluid restrictions guidelines as recommended by your provider - Call your provider for new or ongoing symptoms. (Call your doctor today 01/19/2021 to report elevated blood pressures and symptoms) - Continue to take your medications as prescribed.  - Follow a low sodium/ DASH diet  (review education information on Low sodium / DASH diet sent to you in MyChart by your RN nurse case manager).   Why is this important?   You won't feel high blood pressure, but it can still hurt your blood vessels.  High blood pressure can cause heart or kidney problems. It can also cause a stroke.  Making lifestyle changes like losing a little weight or eating less salt will help.  Checking your blood pressure at home and at different times of the day can help to control blood pressure.  If the doctor prescribes medicine remember to take it the way the doctor ordered.  Call the office if you cannot afford the medicine or if there are questions about it.             Patient verbalizes understanding of instructions provided today and agrees to view in [place].   The patient has been provided with contact information for the care management team and has been advised to call with any health related questions or concerns.  The care management team will reach out to the patient again over the next 30 days.   Quinn Plowman RN,BSN,CCM RN Case  Manager [place]  [phone]

## 2021-01-19 NOTE — Telephone Encounter (Signed)
Pt called wanting to discuss her BP medication. She stated that her BP seems to run high on the days she has dialysis

## 2021-01-19 NOTE — Chronic Care Management (AMB) (Signed)
Care Management    RN Visit Note  01/19/2021 Name: Carrie [name] MRN: [id number] DOB: 1951-02-05  Subjective: Carrie [name] is a 70 y.o. year old female who is a primary care patient of Venia Carbon, MD. The care management team was consulted for assistance with disease management and care coordination needs.    Engaged with patient by telephone for follow up visit in response to provider referral for case management and/or care coordination services.   Consent to Services:   Carrie [name] was given information about Care Management services today including:  Care Management services includes personalized support from designated clinical staff supervised by her physician, including individualized plan of care and coordination with other care providers 24/7 contact phone numbers for assistance for urgent and routine care needs. The patient may stop case management services at any time by phone call to the office staff.  Patient agreed to services and consent obtained.   Assessment: Review of patient past medical history, allergies, medications, health status, including review of consultants reports, laboratory and other test data, was performed as part of comprehensive evaluation and provision of chronic care management services.   SDOH (Social Determinants of Health) assessments and interventions performed:    Care Plan  Allergies  Allergen Reactions   Naproxen Sodium Anaphylaxis and Swelling   Sulfamethoxazole-Trimethoprim Anaphylaxis and Swelling    Outpatient Encounter Medications as of 01/19/2021  Medication Sig   acetaminophen (TYLENOL) 500 MG tablet Take 1,000 mg by mouth every 6 (six) hours as needed (for pain.).    albuterol (VENTOLIN HFA) 108 (90 Base) MCG/ACT inhaler Inhale 2 puffs into the lungs as needed.   aspirin EC 81 MG tablet Take 81 mg by mouth daily.   atorvastatin (LIPITOR) 40 MG tablet TAKE 40 MG BY MOUTH ONCE DAILY (Patient taking differently: Take 40  mg by mouth daily.)   levETIRAcetam (KEPPRA) 1000 MG tablet TAKE 1 TABLET BY MOUTH TWICE A DAY   lidocaine-prilocaine (EMLA) cream Apply 1 application topically every Monday, Wednesday, and Friday with hemodialysis.   LORazepam (ATIVAN) 0.5 MG tablet Take 1 tablet (0.5 mg total) by mouth 2 (two) times daily as needed for anxiety.   losartan (COZAAR) 25 MG tablet Take 25 mg by mouth every evening.    metoprolol tartrate (LOPRESSOR) 25 MG tablet Take 25 mg by mouth every evening.    multivitamin (RENA-VIT) TABS tablet Take 1 tablet by mouth at bedtime.   sevelamer carbonate (RENVELA) 800 MG tablet Take 800-1,600 mg by mouth See admin instructions. Take 2 tablets (1600 mg) by mouth with each meal & 1 tablet (800 mg) by mouth with each snack   torsemide (DEMADEX) 20 MG tablet Take 20 mg by mouth 4 (four) times a week. Take 1 tablet (20 mg) by mouth in the morning on Tuesdays, Thursdays, Saturdays, & Sundays in the morning on non-dialysis days.   No facility-administered encounter medications on file as of 01/19/2021.    Patient Active Problem List   Diagnosis Date Noted   Tremor 12/01/2020   Headache 12/01/2020   Eustachian tube dysfunction 10/25/2020   Viral URI with cough 09/26/2020   History of shingles 09/26/2020   Primary osteoarthritis of left knee 08/22/2020   Meningioma ([place]) 01/77/9390   Complication from renal dialysis device 11/20/2018   ESRD (end stage renal disease) ([place]) 08/04/2018   Chronic diastolic CHF (congestive heart failure) ([place]) 11/20/2017   Cough 10/01/2017   Status post total right knee replacement 04/30/2017   Unilateral  primary osteoarthritis, right knee 03/18/2017   History of total hip replacement, left 03/18/2017   Unilateral primary osteoarthritis, left hip 03/27/2016   Status post total replacement of left hip 03/27/2016   Mood disorder ([place]) 04/01/2015   Osteoarthritis of right knee 02/03/2014   Advanced directives, counseling/discussion 02/03/2014    Routine general medical examination at a health care facility 07/08/2012   BMI 40.0-44.9, adult ([place]) 07/08/2012   Type 2 diabetes, controlled, with renal manifestation ([place])    HYPERCHOLESTEROLEMIA 09/26/2006   Essential hypertension, benign 09/26/2006   ALLERGIC RHINITIS 09/26/2006   History of meningioma 08/03/2006    Conditions to be addressed/monitored: HTN  Care Plan : Hypertension (Adult)  Updates made by Dannielle Karvonen, RN since 01/19/2021 12:00 AM     Problem: Knowledge deficit related to long term care plan for management of Hypertension in dialysis patient   Priority: High     Long-Range Goal: Hypertension monitored and managed   Start Date: 12/06/2020  Expected End Date: 05/03/2021  This Visit's Progress: Not on track  Recent Progress: On track  Priority: High  Note:   Objective:  Last practice recorded BP readings:  BP Readings from Last 3 Encounters:  12/01/20 (!) 150/98  10/25/20 140/90  10/13/20 (!) 187/77  Current Barriers:  Knowledge Deficits related to basic understanding of hypertension pathophysiology and self care management;  Patient states for the last 2 weeks her blood pressure has increased when she is leaving from dialysis.  She states she rechecks her blood pressure when she get home and it may drop to 183/88, 187/89.  Patient states her blood pressure has been 200/100 several times. She reports today's blood pressure was 199/93.  Patient states she is having headaches as well.  Denies any changes in vision.  She states she is taking metoprolol and losartan 25 mg daily.  Patient states she has not seen or talked with a doctor regarding her blood pressures.  She states she is scheduled for dialysis tomorrow and will see the nurse practitioner.  RNCM advised patient to call her doctor today to report elevated blood pressures and symptoms.  Case Manager Clinical Goal(s):  patient will verbalize understanding of plan for hypertension management patient will  attend scheduled medical appointments: patient will demonstrate improved health management independence as evidenced by checking blood pressure as directed and notifying her provider for elevated or low readings, taking all medications as prescribed, and adhering to a low sodium /DASH diet as discussed. Interventions:  Collaboration with Venia Carbon, MD regarding development and update of comprehensive plan of care as evidenced by provider attestation and co-signature Inter-disciplinary care team collaboration (see longitudinal plan of care) Evaluation of current treatment plan related to hypertension self management and patient's adherence to plan as established by provider. Provided education to patient re: stroke prevention, s/s of heart attack and stroke,  low salt/ DASH diet, complications of uncontrolled blood pressure Reviewed medications with patient and discussed importance of compliance Discussed plans with patient for ongoing care management follow up and provided patient with direct contact information for care management team Advised patient, providing education and rationale, to monitor blood pressure daily and record, calling PCP for findings outside established parameters.  Reviewed scheduled/upcoming provider appointments :  Annual wellness visit scheduled with primary care provider on 02/07/2021.   Self-Care Activities: - Self administers medications as prescribed Attends all scheduled provider appointments Calls provider office for new concerns, questions, or BP outside discussed parameters Checks BP and records as discussed  Follows a low sodium diet/DASH diet Patient Goals: - Continue to check blood pressure at least 3 times per week - write blood pressure results in a log or diary (take log to your appointments to share information with your provider) - Continue to follow fluid restrictions guidelines as recommended by your provider - Call your provider for new or  ongoing symptoms. (Call your doctor today 01/19/2021 to report elevated blood pressures and symptoms) - Continue to take your medications as prescribed.  - Follow a low sodium/ DASH diet  (review education information on Low sodium / DASH diet sent to you in MyChart by your RN nurse case manager). Follow Up Plan: The patient has been provided with contact information for the care management team and has been advised to call with any health related questions or concerns.  The care management team will reach out to the patient again over the next 45 days.           Plan: The patient has been provided with contact information for the care management team and has been advised to call with any health related questions or concerns.  and The care management team will reach out to the patient again over the next 30 days.  Quinn Plowman RN,BSN,CCM RN Case Manager [place]  [phone]

## 2021-01-19 NOTE — Telephone Encounter (Signed)
Spoke to pt. She will see them tomorrow at dialysis.

## 2021-01-20 DIAGNOSIS — N186 End stage renal disease: Secondary | ICD-10-CM | POA: Diagnosis not present

## 2021-01-20 DIAGNOSIS — E119 Type 2 diabetes mellitus without complications: Secondary | ICD-10-CM | POA: Diagnosis not present

## 2021-01-20 DIAGNOSIS — Z992 Dependence on renal dialysis: Secondary | ICD-10-CM | POA: Diagnosis not present

## 2021-01-20 LAB — HEMOGLOBIN A1C: Hemoglobin A1C: 5.4

## 2021-01-25 DIAGNOSIS — Z992 Dependence on renal dialysis: Secondary | ICD-10-CM | POA: Diagnosis not present

## 2021-01-25 DIAGNOSIS — N186 End stage renal disease: Secondary | ICD-10-CM | POA: Diagnosis not present

## 2021-01-26 ENCOUNTER — Ambulatory Visit: Payer: Medicare HMO

## 2021-01-26 DIAGNOSIS — I10 Essential (primary) hypertension: Secondary | ICD-10-CM

## 2021-01-26 DIAGNOSIS — N186 End stage renal disease: Secondary | ICD-10-CM

## 2021-01-26 NOTE — Patient Instructions (Signed)
Visit Information:  Thank you for taking the time to speak with me today.    Goals Addressed             This Visit's Progress    Track and Manage My Blood Pressure-Hypertension   Not on track    Timeframe:  Long-Range Goal Priority:  High Start Date:   12/06/2020                          Expected End Date:   05/03/2021                   Follow Up Date 03/07/2021   - Continue to check blood pressure at least 3 times per week - write blood pressure results in a log or diary (take log to your appointments to share information with your provider) - Continue to follow fluid restrictions guidelines as recommended by your provider - Call your provider for new or ongoing symptoms. (Call your doctor today 01/19/2021 to report elevated blood pressures and symptoms) - Continue to take your medications as prescribed. ( Take losartan according to guidelines given to you by your provider for elevated blood pressures) -  Continue to follow a low sodium/ DASH diet  (review education information on Low sodium / DASH diet sent to you in MyChart by your RN nurse case manager).   Why is this important?   You won't feel high blood pressure, but it can still hurt your blood vessels.  High blood pressure can cause heart or kidney problems. It can also cause a stroke.  Making lifestyle changes like losing a little weight or eating less salt will help.  Checking your blood pressure at home and at different times of the day can help to control blood pressure.  If the doctor prescribes medicine remember to take it the way the doctor ordered.  Call the office if you cannot afford the medicine or if there are questions about it.             Patient verbalizes understanding of instructions provided today and agrees to view in [place].   The patient has been provided with contact information for the care management team and has been advised to call with any health related questions or concerns.  The care  management team will reach out to the patient again over the next 45 days.   Quinn Plowman RN,BSN,CCM RN Case Manager [place]  [phone]

## 2021-01-26 NOTE — Chronic Care Management (AMB) (Signed)
Care Management    RN Visit Note  01/26/2021 Name: Carrie [name] MRN: [id number] DOB: [birth year]  Subjective: Carrie [name] is a 70 y.o. year old female who is a primary care patient of Venia Carbon, MD. The care management team was consulted for assistance with disease management and care coordination needs.    Engaged with patient by telephone for follow up visit in response to provider referral for case management and/or care coordination services.   Consent to Services:   Ms. Glanz was given information about Care Management services today including:  Care Management services includes personalized support from designated clinical staff supervised by her physician, including individualized plan of care and coordination with other care providers 24/7 contact phone numbers for assistance for urgent and routine care needs. The patient may stop case management services at any time by phone call to the office staff.  Patient agreed to services and consent obtained.   Assessment: Review of patient past medical history, allergies, medications, health status, including review of consultants reports, laboratory and other test data, was performed as part of comprehensive evaluation and provision of chronic care management services.   SDOH (Social Determinants of Health) assessments and interventions performed:    Care Plan  Allergies  Allergen Reactions   Naproxen Sodium Anaphylaxis and Swelling   Sulfamethoxazole-Trimethoprim Anaphylaxis and Swelling    Outpatient Encounter Medications as of 01/26/2021  Medication Sig   acetaminophen (TYLENOL) 500 MG tablet Take 1,000 mg by mouth every 6 (six) hours as needed (for pain.).    albuterol (VENTOLIN HFA) 108 (90 Base) MCG/ACT inhaler Inhale 2 puffs into the lungs as needed.   aspirin EC 81 MG tablet Take 81 mg by mouth daily.   atorvastatin (LIPITOR) 40 MG tablet TAKE 40 MG BY MOUTH ONCE DAILY (Patient taking differently: Take 40  mg by mouth daily.)   levETIRAcetam (KEPPRA) 1000 MG tablet TAKE 1 TABLET BY MOUTH TWICE A DAY   lidocaine-prilocaine (EMLA) cream Apply 1 application topically every Monday, Wednesday, and Friday with hemodialysis.   LORazepam (ATIVAN) 0.5 MG tablet Take 1 tablet (0.5 mg total) by mouth 2 (two) times daily as needed for anxiety.   losartan (COZAAR) 25 MG tablet Take 25 mg by mouth every evening.    metoprolol tartrate (LOPRESSOR) 25 MG tablet Take 25 mg by mouth every evening.    multivitamin (RENA-VIT) TABS tablet Take 1 tablet by mouth at bedtime.   sevelamer carbonate (RENVELA) 800 MG tablet Take 800-1,600 mg by mouth See admin instructions. Take 2 tablets (1600 mg) by mouth with each meal & 1 tablet (800 mg) by mouth with each snack   torsemide (DEMADEX) 20 MG tablet Take 20 mg by mouth 4 (four) times a week. Take 1 tablet (20 mg) by mouth in the morning on Tuesdays, Thursdays, Saturdays, & Sundays in the morning on non-dialysis days.   No facility-administered encounter medications on file as of 01/26/2021.    Patient Active Problem List   Diagnosis Date Noted   Tremor 12/01/2020   Headache 12/01/2020   Eustachian tube dysfunction 10/25/2020   Viral URI with cough 09/26/2020   History of shingles 09/26/2020   Primary osteoarthritis of left knee 08/22/2020   Meningioma ([place]) 34/74/2595   Complication from renal dialysis device 11/20/2018   ESRD (end stage renal disease) ([place]) 08/04/2018   Chronic diastolic CHF (congestive heart failure) ([place]) 11/20/2017   Cough 10/01/2017   Status post total right knee replacement 04/30/2017   Unilateral  primary osteoarthritis, right knee 03/18/2017   History of total hip replacement, left 03/18/2017   Unilateral primary osteoarthritis, left hip 03/27/2016   Status post total replacement of left hip 03/27/2016   Mood disorder ([place]) 04/01/2015   Osteoarthritis of right knee 02/03/2014   Advanced directives, counseling/discussion 02/03/2014    Routine general medical examination at a health care facility 07/08/2012   BMI 40.0-44.9, adult ([place]) 07/08/2012   Type 2 diabetes, controlled, with renal manifestation ([place])    HYPERCHOLESTEROLEMIA 09/26/2006   Essential hypertension, benign 09/26/2006   ALLERGIC RHINITIS 09/26/2006   History of meningioma 08/03/2006    Conditions to be addressed/monitored: HTN and ESRD  Care Plan : Hypertension (Adult)  Updates made by Dannielle Karvonen, RN since 01/26/2021 12:00 AM     Problem: Knowledge deficit related to long term care plan for management of Hypertension in dialysis patient   Priority: High     Long-Range Goal: Hypertension monitored and managed   Start Date: 12/06/2020  Expected End Date: 05/03/2021  This Visit's Progress: Not on track  Recent Progress: Not on track  Priority: High  Note:   Objective:  Last practice recorded BP readings:  BP Readings from Last 3 Encounters:  12/01/20 (!) 150/98  10/25/20 140/90  10/13/20 (!) 187/77  Current Barriers:  Knowledge Deficits related to basic understanding of hypertension pathophysiology and self care management;  Patient states her blood pressure is still going up and down. She states she was seen by the PA at the dialysis within the past week.  She states she is to take 2 losartan for a systolic blood pressure > 140 or take 2 losartan for a diastolic blood pressure > 90.  Patient reports blood pressure readings within the past week:  115/99, 213/105, 110/98, 179/100.  Patient states she did not attend dialysis on Monday 07/26/2020 due to not feeling well.  She states this has since resolved.   Case Manager Clinical Goal(s):  patient will verbalize understanding of plan for hypertension management patient will attend scheduled medical appointments: patient will demonstrate improved health management independence as evidenced by checking blood pressure as directed and notifying her provider for elevated or low readings, taking all  medications as prescribed, and adhering to a low sodium /DASH diet as discussed. Interventions:  Collaboration with Venia Carbon, MD regarding development and update of comprehensive plan of care as evidenced by provider attestation and co-signature Inter-disciplinary care team collaboration (see longitudinal plan of care) Evaluation of current treatment plan related to hypertension self management and patient's adherence to plan as established by provider. Provided education to patient re: stroke prevention, s/s of heart attack and stroke,  low salt/ DASH diet, complications of uncontrolled blood pressure Reviewed medications with patient and discussed importance of compliance Discussed plans with patient for ongoing care management follow up and provided patient with direct contact information for care management team Advised patient, providing education and rationale, to monitor blood pressure daily and record, calling PCP for findings outside established parameters.  Reviewed scheduled/upcoming provider appointments :  Annual wellness visit scheduled with primary care provider on 02/07/2021.   Self-Care Activities: - Self administers medications as prescribed Attends all scheduled provider appointments Calls provider office for new concerns, questions, or BP outside discussed parameters Checks BP and records as discussed Follows a low sodium diet/DASH diet Patient Goals: - Continue to check blood pressure at least 3 times per week - write blood pressure results in a log or diary (take log to your  appointments to share information with your provider) - Continue to follow fluid restrictions guidelines as recommended by your provider - Call your provider for new or ongoing symptoms. (Call your doctor today 01/19/2021 to report elevated blood pressures and symptoms) - Continue to take your medications as prescribed. ( Take losartan according to guidelines given to you by your provider for  elevated blood pressures) -  Continue to follow a low sodium/ DASH diet  (review education information on Low sodium / DASH diet sent to you in MyChart by your RN nurse case manager). Follow Up Plan: The patient has been provided with contact information for the care management team and has been advised to call with any health related questions or concerns.  The care management team will reach out to the patient again over the next 45 days.           Plan: The patient has been provided with contact information for the care management team and has been advised to call with any health related questions or concerns.  and The care management team will reach out to the patient again over the next 45 days.  Quinn Plowman RN,BSN,CCM RN Case Manager [facility]  [phone]

## 2021-01-27 DIAGNOSIS — Z992 Dependence on renal dialysis: Secondary | ICD-10-CM | POA: Diagnosis not present

## 2021-01-27 DIAGNOSIS — N186 End stage renal disease: Secondary | ICD-10-CM | POA: Diagnosis not present

## 2021-01-30 DIAGNOSIS — N186 End stage renal disease: Secondary | ICD-10-CM | POA: Diagnosis not present

## 2021-01-30 DIAGNOSIS — Z992 Dependence on renal dialysis: Secondary | ICD-10-CM | POA: Diagnosis not present

## 2021-02-01 DIAGNOSIS — N186 End stage renal disease: Secondary | ICD-10-CM | POA: Diagnosis not present

## 2021-02-01 DIAGNOSIS — I10 Essential (primary) hypertension: Secondary | ICD-10-CM

## 2021-02-01 DIAGNOSIS — Z992 Dependence on renal dialysis: Secondary | ICD-10-CM | POA: Diagnosis not present

## 2021-02-03 DIAGNOSIS — Z992 Dependence on renal dialysis: Secondary | ICD-10-CM | POA: Diagnosis not present

## 2021-02-03 DIAGNOSIS — N186 End stage renal disease: Secondary | ICD-10-CM | POA: Diagnosis not present

## 2021-02-06 DIAGNOSIS — N186 End stage renal disease: Secondary | ICD-10-CM | POA: Diagnosis not present

## 2021-02-06 DIAGNOSIS — Z992 Dependence on renal dialysis: Secondary | ICD-10-CM | POA: Diagnosis not present

## 2021-02-07 ENCOUNTER — Other Ambulatory Visit: Payer: Self-pay

## 2021-02-07 ENCOUNTER — Ambulatory Visit (INDEPENDENT_AMBULATORY_CARE_PROVIDER_SITE_OTHER): Payer: Medicare HMO | Admitting: Internal Medicine

## 2021-02-07 ENCOUNTER — Encounter: Payer: Self-pay | Admitting: Internal Medicine

## 2021-02-07 VITALS — BP 180/90 | HR 72 | Temp 98.3°F | Ht 62.5 in | Wt 245.0 lb

## 2021-02-07 DIAGNOSIS — Z86018 Personal history of other benign neoplasm: Secondary | ICD-10-CM | POA: Diagnosis not present

## 2021-02-07 DIAGNOSIS — I5032 Chronic diastolic (congestive) heart failure: Secondary | ICD-10-CM

## 2021-02-07 DIAGNOSIS — R1310 Dysphagia, unspecified: Secondary | ICD-10-CM | POA: Insufficient documentation

## 2021-02-07 DIAGNOSIS — Z6841 Body Mass Index (BMI) 40.0 and over, adult: Secondary | ICD-10-CM | POA: Diagnosis not present

## 2021-02-07 DIAGNOSIS — Z0000 Encounter for general adult medical examination without abnormal findings: Secondary | ICD-10-CM

## 2021-02-07 DIAGNOSIS — Z23 Encounter for immunization: Secondary | ICD-10-CM

## 2021-02-07 DIAGNOSIS — R1313 Dysphagia, pharyngeal phase: Secondary | ICD-10-CM

## 2021-02-07 DIAGNOSIS — Z1211 Encounter for screening for malignant neoplasm of colon: Secondary | ICD-10-CM

## 2021-02-07 DIAGNOSIS — F39 Unspecified mood [affective] disorder: Secondary | ICD-10-CM

## 2021-02-07 DIAGNOSIS — N186 End stage renal disease: Secondary | ICD-10-CM

## 2021-02-07 DIAGNOSIS — E1121 Type 2 diabetes mellitus with diabetic nephropathy: Secondary | ICD-10-CM | POA: Diagnosis not present

## 2021-02-07 LAB — HM DIABETES FOOT EXAM

## 2021-02-07 MED ORDER — LORAZEPAM 0.5 MG PO TABS: 0.5000 mg | ORAL_TABLET | Freq: Two times a day (BID) | ORAL | 0 refills | Status: DC | PRN

## 2021-02-07 MED ORDER — OMEPRAZOLE 20 MG PO CPDR: 20.0000 mg | DELAYED_RELEASE_CAPSULE | Freq: Every day | ORAL | 3 refills | Status: DC

## 2021-02-07 NOTE — Assessment & Plan Note (Signed)
Lifelong seizure prophylaxis with keppra

## 2021-02-07 NOTE — Assessment & Plan Note (Signed)
I have personally reviewed the Medicare Annual Wellness questionnaire and have noted 1. The patient's medical and social history 2. Their use of alcohol, tobacco or illicit drugs 3. Their current medications and supplements 4. The patient's functional ability including ADL's, fall risks, home safety risks and hearing or visual             impairment. 5. Diet and physical activities 6. Evidence for depression or mood disorders  The patients weight, height, BMI and visual acuity have been recorded in the chart I have made referrals, counseling and provided education to the patient based review of the above and I have provided the pt with a written personalized care plan for preventive services.  I have provided you with a copy of your personalized plan for preventive services. Please take the time to review along with your updated medication list.  Flu vaccine today Mammogram in the next year FIT --kit given Tries to walk Bivalent COVID when available Td if any injury

## 2021-02-07 NOTE — Assessment & Plan Note (Signed)
Lab Results  Component Value Date   HGBA1C 5.4% 01/20/2021   No meds needed

## 2021-02-07 NOTE — Progress Notes (Signed)
Hearing Screening   500Hz  1000Hz  2000Hz  4000Hz   Right ear 40 40 40 0  Left ear 40 40 40 40  Vision Screening - Comments:: August 2022

## 2021-02-07 NOTE — Assessment & Plan Note (Signed)
BMI stable at 44 Trying to walk and being careful with eating

## 2021-02-07 NOTE — Progress Notes (Signed)
Subjective:    Patient ID: Carrie [name], female    DOB: 27-Jun-1950, 70 y.o.   MRN: [id number]  HPI Here with sister for Medicare wellness visit and follow up of chronic health conditions This visit occurred during the SARS-CoV-2 public health emergency.  Safety protocols were in place, including screening questions prior to the visit, additional usage of staff PPE, and extensive cleaning of exam room while observing appropriate contact time as indicated for disinfecting solutions.   Reviewed form and advanced directives Reviewed other doctors No alcohol or tobacco Tries to walk some Vision is fine Hearing is good No falls Independent with instrumental ADLs Drives---but doesn't do the grocery shopping No sig memory issues  Continues on hemodialysis Doing well with this Manages her fluid BP has been up some---they have adjusted her losartan (50mg  if up, instead of 25mg ) and restarted amlodipine No chest pain No SOB No edema No dizziness or syncope No palpitations  Not checking sugars Last A1c was 5.4% No foot burning, numbness or pain  Mood is okay No depression and not anhedonic Uses the lorazepam for her nerves  No recurrence of symptoms since meningioma repair Continues on the keppra indefinitely  Current Outpatient Medications on File Prior to Visit  Medication Sig Dispense Refill   acetaminophen (TYLENOL) 500 MG tablet Take 1,000 mg by mouth every 6 (six) hours as needed (for pain.).      albuterol (VENTOLIN HFA) 108 (90 Base) MCG/ACT inhaler Inhale 2 puffs into the lungs as needed.     aspirin EC 81 MG tablet Take 81 mg by mouth daily.     atorvastatin (LIPITOR) 40 MG tablet TAKE 40 MG BY MOUTH ONCE DAILY (Patient taking differently: Take 40 mg by mouth daily.) 90 tablet 3   levETIRAcetam (KEPPRA) 1000 MG tablet TAKE 1 TABLET BY MOUTH TWICE A DAY 180 tablet 3   lidocaine-prilocaine (EMLA) cream Apply 1 application topically every Monday, Wednesday, and Friday  with hemodialysis.     LORazepam (ATIVAN) 0.5 MG tablet Take 1 tablet (0.5 mg total) by mouth 2 (two) times daily as needed for anxiety. 60 tablet 0   losartan (COZAAR) 25 MG tablet Take 25 mg by mouth every evening. Take 2 if over 140 or over 90.     metoprolol tartrate (LOPRESSOR) 25 MG tablet Take 25 mg by mouth every evening.      multivitamin (RENA-VIT) TABS tablet Take 1 tablet by mouth at bedtime. 30 tablet 0   sevelamer carbonate (RENVELA) 800 MG tablet Take 800-1,600 mg by mouth See admin instructions. Take 2 tablets (1600 mg) by mouth with each meal & 1 tablet (800 mg) by mouth with each snack     torsemide (DEMADEX) 20 MG tablet Take 20 mg by mouth 4 (four) times a week. Take 1 tablet (20 mg) by mouth in the morning on Tuesdays, Thursdays, Saturdays, & Sundays in the morning on non-dialysis days.     amLODipine (NORVASC) 5 MG tablet Take 5 mg by mouth daily.     No current facility-administered medications on file prior to visit.    Allergies  Allergen Reactions   Naproxen Sodium Anaphylaxis and Swelling   Sulfamethoxazole-Trimethoprim Anaphylaxis and Swelling    Past Medical History:  Diagnosis Date   Allergy    Anemia    Arthritis    "right knee" (10/26'/2017)   CKD (chronic kidney disease), stage III ([place])    12/2015 (Dr. 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   Type II diabetes mellitus ([place]) 2011   on metformin until yr ago- no med now - off due to kidneys    Past Surgical History:  Procedure Laterality Date   A/V FISTULAGRAM Left 03/10/2019   Procedure: A/V FISTULAGRAM;  Surgeon: Katha Cabal, MD;  Location: [facility];  Service: Cardiovascular;  Laterality: Left;   A/V FISTULAGRAM Left 07/14/2019   Procedure: A/V FISTULAGRAM;  Surgeon: Katha Cabal, MD;  Location: [facility];   Service: Cardiovascular;  Laterality: Left;   A/V FISTULAGRAM Left 01/07/2020   Procedure: A/V FISTULAGRAM;  Surgeon: Katha Cabal, MD;  Location: [facility];  Service: Cardiovascular;  Laterality: Left;   AV FISTULA PLACEMENT Left 11/07/2018   Procedure: ARTERIOVENOUS (AV) FISTULA CREATION ( BRACHIO BASILIC ) VS BRACHIAL AXILLARY GRAFT;  Surgeon: Katha Cabal, MD;  Location: [facility];  Service: Vascular;  Laterality: Left;   BRAIN MENINGIOMA EXCISION  08/2006   Foramina magnum meningioma   CERVICAL DISCECTOMY  1996   Dr. Alric Seton   CESAREAN [place]   COLONOSCOPY     DIALYSIS/PERMA CATHETER INSERTION N/A 08/04/2018   Procedure: DIALYSIS/PERMA CATHETER INSERTION;  Surgeon: Algernon Huxley, MD;  Location: [facility];  Service: Cardiovascular;  Laterality: N/A;   DIALYSIS/PERMA CATHETER REMOVAL N/A 12/25/2018   Procedure: DIALYSIS/PERMA CATHETER REMOVAL;  Surgeon: Algernon Huxley, MD;  Location: [facility];  Service: Cardiovascular;  Laterality: N/A;   DILATION AND CURETTAGE OF UTERUS     JOINT REPLACEMENT     right knee left hip   Right wrist x-ray  2007   Deg. at 1st MCP   TOTAL HIP ARTHROPLASTY Left 03/27/2016   Procedure: LEFT TOTAL HIP ARTHROPLASTY ANTERIOR APPROACH;  Surgeon: Mcarthur Rossetti, MD;  Location: [facility];  Service: Orthopedics;  Laterality: Left;   TOTAL KNEE ARTHROPLASTY Right 04/30/2017   Procedure: RIGHT TOTAL KNEE ARTHROPLASTY;  Surgeon: Mcarthur Rossetti, MD;  Location: [facility];  Service: Orthopedics;  Laterality: Right;   TUBAL LIGATION  1981    Family History  Problem Relation Age of Onset   Hypertension Other        Strong family history    Breast cancer Mother 19   Heart failure Sister    Heart disease Sister     Social History   Socioeconomic History   Marital status: Widowed    Spouse name: Not on file   Number of children: 1   Years of education: Not on file   Highest education level: Not on file   Occupational History   Occupation: Formerly Psychologist, forensic, 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      No living will   Requests niece, [name], as health care POA   Would accept resuscitation attempts   No tube feeds if cognitively aware   Social Determinants  of Health   Financial Resource Strain: Low Risk    Difficulty of Paying Living Expenses: Not hard at all  Food Insecurity: No Food Insecurity   Worried About [place] in the Last Year: Never true   [place] in the Last Year: Never true  Transportation Needs: No Transportation Needs   Lack of Transportation (Medical): No   Lack of Transportation (Non-Medical): No  Physical Activity: Not on file  Stress: Not on file  Social Connections: Not on file  Intimate Partner Violence: Not on file   Review of Systems Appetite is down some Weight down a little bit overall Sleeps okay Wears seat belt Teeth okay---no recent dental appt (can't afford recommended lower partial) No heartburn---but has rare dysphagia (food or drink---gets throat tickling and can have severe reaction) Bowels move fine--back on IV iron but no constipation No dysuria or hematuria---puts out a fair amount of urine Ongoing knee pain--not a TKR candidate No suspicious skin lesions    Objective:   Physical Exam Constitutional:      Appearance: Normal appearance.  HENT:     Mouth/Throat:     Comments: No lesions Eyes:     Conjunctiva/sclera: Conjunctivae normal.     Pupils: Pupils are equal, round, and reactive to light.  Cardiovascular:     Rate and Rhythm: Normal rate and  regular rhythm.     Heart sounds: No murmur heard.   No gallop.  Pulmonary:     Effort: Pulmonary effort is normal.     Breath sounds: Normal breath sounds. No wheezing or rales.  Abdominal:     Palpations: Abdomen is soft.     Tenderness: There is no abdominal tenderness.  Musculoskeletal:     Cervical back: Neck supple.     Right lower leg: No edema.     Left lower leg: No edema.  Lymphadenopathy:     Cervical: No cervical adenopathy.  Skin:    General: Skin is warm.     Findings: No rash.     Comments: No foot lesions  Neurological:     Mental Status: She is alert and oriented to person, place, and time.     Comments: President--"Joe Biden, Trump, Obama" [id number] Recall 3/3  Normal sensation in feet  Psychiatric:        Mood and Affect: Mood normal.        Behavior: Behavior normal.           Assessment & Plan:

## 2021-02-07 NOTE — Addendum Note (Signed)
Addended by: Pilar Grammes on: 02/07/2021 03:24 PM   Modules accepted: Orders

## 2021-02-07 NOTE — Assessment & Plan Note (Signed)
Doing well on the hemodialysis

## 2021-02-07 NOTE — Assessment & Plan Note (Signed)
No persistent depression Anxiety----lorazepam helps

## 2021-02-07 NOTE — Assessment & Plan Note (Signed)
Compensated on dialysis Torsemide/metoprolol/losartan

## 2021-02-07 NOTE — Assessment & Plan Note (Signed)
Not often but can be severe Cough at night also Will start omeprazole

## 2021-02-08 DIAGNOSIS — Z992 Dependence on renal dialysis: Secondary | ICD-10-CM | POA: Diagnosis not present

## 2021-02-08 DIAGNOSIS — N186 End stage renal disease: Secondary | ICD-10-CM | POA: Diagnosis not present

## 2021-02-09 ENCOUNTER — Encounter: Payer: Self-pay | Admitting: Orthopaedic Surgery

## 2021-02-09 ENCOUNTER — Ambulatory Visit (INDEPENDENT_AMBULATORY_CARE_PROVIDER_SITE_OTHER): Payer: Medicare HMO | Admitting: Orthopaedic Surgery

## 2021-02-09 VITALS — Ht 62.5 in | Wt 244.0 lb

## 2021-02-09 DIAGNOSIS — Z6841 Body Mass Index (BMI) 40.0 and over, adult: Secondary | ICD-10-CM

## 2021-02-09 DIAGNOSIS — M1712 Unilateral primary osteoarthritis, left knee: Secondary | ICD-10-CM | POA: Diagnosis not present

## 2021-02-09 DIAGNOSIS — G8929 Other chronic pain: Secondary | ICD-10-CM | POA: Diagnosis not present

## 2021-02-09 DIAGNOSIS — M25562 Pain in left knee: Secondary | ICD-10-CM

## 2021-02-09 NOTE — Progress Notes (Signed)
Patient is continue to follow-up with known arthritis of her left knee.  I actually replaced her right knee in 2019 and her BMI then was 46.  Her BMI is down to 43 but now we are in the situation of not being able to replace her knee until her BMI is below 40.  She actually says with her right knee being replaced and wearing a knee brace on her left knee as she is mobilizing pretty good.  She is 70 years old.  She is on dialysis so certainly there is a higher complication rate with her situation as it involves morbid obesity with dialysis which can certainly heighten a complication risk of infection.  Her right operative knee moves smoothly and fluidly.  Her left knee actually is moving pretty good today with varus malalignment but looks pretty good she is walked in today without a brace and feeling well.  She states she does not need an injection and right now she is not interested in surgery since she is maintaining herself in terms of her mobility with wearing the knee brace and weight loss and activity modification.  With that being said, follow-up will be as needed.  We can always inject her knee if he gets towards hurting her enough.  All questions and concerns were answered and addressed.

## 2021-02-10 ENCOUNTER — Telehealth: Payer: Self-pay | Admitting: Radiology

## 2021-02-10 ENCOUNTER — Other Ambulatory Visit (INDEPENDENT_AMBULATORY_CARE_PROVIDER_SITE_OTHER): Payer: Medicare HMO

## 2021-02-10 DIAGNOSIS — Z992 Dependence on renal dialysis: Secondary | ICD-10-CM | POA: Diagnosis not present

## 2021-02-10 DIAGNOSIS — Z1211 Encounter for screening for malignant neoplasm of colon: Secondary | ICD-10-CM | POA: Diagnosis not present

## 2021-02-10 DIAGNOSIS — N186 End stage renal disease: Secondary | ICD-10-CM | POA: Diagnosis not present

## 2021-02-10 DIAGNOSIS — R195 Other fecal abnormalities: Secondary | ICD-10-CM

## 2021-02-10 LAB — FECAL OCCULT BLOOD, IMMUNOCHEMICAL: Fecal Occult Bld: POSITIVE — AB

## 2021-02-10 NOTE — Telephone Encounter (Signed)
[facility] called a critical result, POSITIVE IFOB, results sent to Dr Silvio Pate and given to Dr Danise Mina.

## 2021-02-10 NOTE — Telephone Encounter (Signed)
Done during routine physical. Ok to wait for PCP.

## 2021-02-13 DIAGNOSIS — N186 End stage renal disease: Secondary | ICD-10-CM | POA: Diagnosis not present

## 2021-02-13 DIAGNOSIS — Z992 Dependence on renal dialysis: Secondary | ICD-10-CM | POA: Diagnosis not present

## 2021-02-13 NOTE — Telephone Encounter (Signed)
Spoke to pt. She would like [place].

## 2021-02-13 NOTE — Addendum Note (Signed)
Addended by: Viviana Simpler I on: 02/13/2021 11:49 AM   Modules accepted: Orders

## 2021-02-14 ENCOUNTER — Other Ambulatory Visit: Payer: Self-pay

## 2021-02-14 DIAGNOSIS — R195 Other fecal abnormalities: Secondary | ICD-10-CM

## 2021-02-14 NOTE — Progress Notes (Signed)
Gastroenterology Pre-Procedure Review  Request Date: 03/07/21 Requesting Physician: Dr. Bonna Gains  PATIENT REVIEW QUESTIONS: The patient responded to the following health history questions as indicated:    1. Are you having any GI issues? no 2. Do you have a personal history of Polyps?  Positive Cologuard Test 3. Do you have a family history of Colon Cancer or Polyps? no 4. Diabetes Mellitus? yes (Type II) 5. Joint replacements in the past 12 months?no 6. Major health problems in the past 3 months?no 7. Any artificial heart valves, MVP, or defibrillator?no    MEDICATIONS & ALLERGIES:    Patient reports the following regarding taking any anticoagulation/antiplatelet therapy:   Plavix, Coumadin, Eliquis, Xarelto, Lovenox, Pradaxa, Brilinta, or Effient? yes (Heparin for Dialysis Treatment) Aspirin? yes (81 mg)  Patient confirms/reports the following medications:  Current Outpatient Medications  Medication Sig Dispense Refill   acetaminophen (TYLENOL) 500 MG tablet Take 1,000 mg by mouth every 6 (six) hours as needed (for pain.).      albuterol (VENTOLIN HFA) 108 (90 Base) MCG/ACT inhaler Inhale 2 puffs into the lungs as needed.     amLODipine (NORVASC) 5 MG tablet Take 5 mg by mouth daily.     aspirin EC 81 MG tablet Take 81 mg by mouth daily.     atorvastatin (LIPITOR) 40 MG tablet TAKE 40 MG BY MOUTH ONCE DAILY (Patient taking differently: Take 40 mg by mouth daily.) 90 tablet 3   levETIRAcetam (KEPPRA) 1000 MG tablet TAKE 1 TABLET BY MOUTH TWICE A DAY 180 tablet 3   lidocaine-prilocaine (EMLA) cream Apply 1 application topically every Monday, Wednesday, and Friday with hemodialysis.     LORazepam (ATIVAN) 0.5 MG tablet Take 1 tablet (0.5 mg total) by mouth 2 (two) times daily as needed for anxiety. 60 tablet 0   losartan (COZAAR) 25 MG tablet Take 25 mg by mouth every evening. Take 2 if over 140 or over 90.     metoprolol tartrate (LOPRESSOR) 25 MG tablet Take 25 mg by mouth every  evening.      multivitamin (RENA-VIT) TABS tablet Take 1 tablet by mouth at bedtime. 30 tablet 0   omeprazole (PRILOSEC) 20 MG capsule Take 1 capsule (20 mg total) by mouth daily. 90 capsule 3   sevelamer carbonate (RENVELA) 800 MG tablet Take 800-1,600 mg by mouth See admin instructions. Take 2 tablets (1600 mg) by mouth with each meal & 1 tablet (800 mg) by mouth with each snack     torsemide (DEMADEX) 20 MG tablet Take 20 mg by mouth 4 (four) times a week. Take 1 tablet (20 mg) by mouth in the morning on Tuesdays, Thursdays, Saturdays, & Sundays in the morning on non-dialysis days.     No current facility-administered medications for this visit.    Patient confirms/reports the following allergies:  Allergies  Allergen Reactions   Naproxen Sodium Anaphylaxis and Swelling   Sulfamethoxazole-Trimethoprim Anaphylaxis and Swelling    No orders of the defined types were placed in this encounter.   AUTHORIZATION INFORMATION Primary Insurance: 1D#: Group #:  Secondary Insurance: 1D#: Group #:  SCHEDULE INFORMATION: Date: 03/07/21 Time: Location: [facility]

## 2021-02-15 DIAGNOSIS — Z992 Dependence on renal dialysis: Secondary | ICD-10-CM | POA: Diagnosis not present

## 2021-02-15 DIAGNOSIS — N186 End stage renal disease: Secondary | ICD-10-CM | POA: Diagnosis not present

## 2021-02-16 ENCOUNTER — Telehealth: Payer: Self-pay | Admitting: Internal Medicine

## 2021-02-17 DIAGNOSIS — N186 End stage renal disease: Secondary | ICD-10-CM | POA: Diagnosis not present

## 2021-02-17 DIAGNOSIS — Z992 Dependence on renal dialysis: Secondary | ICD-10-CM | POA: Diagnosis not present

## 2021-02-17 MED ORDER — LEVETIRACETAM 1000 MG PO TABS: 1000.0000 mg | ORAL_TABLET | Freq: Two times a day (BID) | ORAL | 3 refills | Status: DC

## 2021-02-17 NOTE — Telephone Encounter (Signed)
Rx sent electronically.  

## 2021-02-20 DIAGNOSIS — N186 End stage renal disease: Secondary | ICD-10-CM | POA: Diagnosis not present

## 2021-02-20 DIAGNOSIS — Z992 Dependence on renal dialysis: Secondary | ICD-10-CM | POA: Diagnosis not present

## 2021-02-22 DIAGNOSIS — N186 End stage renal disease: Secondary | ICD-10-CM | POA: Diagnosis not present

## 2021-02-22 DIAGNOSIS — Z992 Dependence on renal dialysis: Secondary | ICD-10-CM | POA: Diagnosis not present

## 2021-02-24 DIAGNOSIS — Z992 Dependence on renal dialysis: Secondary | ICD-10-CM | POA: Diagnosis not present

## 2021-02-24 DIAGNOSIS — N186 End stage renal disease: Secondary | ICD-10-CM | POA: Diagnosis not present

## 2021-02-27 DIAGNOSIS — N186 End stage renal disease: Secondary | ICD-10-CM | POA: Diagnosis not present

## 2021-02-27 DIAGNOSIS — Z992 Dependence on renal dialysis: Secondary | ICD-10-CM | POA: Diagnosis not present

## 2021-03-01 DIAGNOSIS — Z992 Dependence on renal dialysis: Secondary | ICD-10-CM | POA: Diagnosis not present

## 2021-03-01 DIAGNOSIS — N186 End stage renal disease: Secondary | ICD-10-CM | POA: Diagnosis not present

## 2021-03-03 DIAGNOSIS — Z992 Dependence on renal dialysis: Secondary | ICD-10-CM | POA: Diagnosis not present

## 2021-03-03 DIAGNOSIS — N186 End stage renal disease: Secondary | ICD-10-CM | POA: Diagnosis not present

## 2021-03-06 DIAGNOSIS — N186 End stage renal disease: Secondary | ICD-10-CM | POA: Diagnosis not present

## 2021-03-06 DIAGNOSIS — Z992 Dependence on renal dialysis: Secondary | ICD-10-CM | POA: Diagnosis not present

## 2021-03-07 ENCOUNTER — Telehealth: Payer: Medicare HMO

## 2021-03-07 ENCOUNTER — Other Ambulatory Visit: Payer: Self-pay

## 2021-03-07 ENCOUNTER — Encounter: Payer: Self-pay | Admitting: Gastroenterology

## 2021-03-07 ENCOUNTER — Encounter
Admission: RE | Disposition: A | Discharge status: Home / Self Care | Payer: Self-pay | Source: Home / Self Care | Attending: Gastroenterology

## 2021-03-07 ENCOUNTER — Ambulatory Visit
Admission: RE | Admit: 2021-03-07 | Discharge: 2021-03-07 | Disposition: A | Discharge status: Home / Self Care | Payer: Medicare HMO | Attending: Gastroenterology | Admitting: Gastroenterology

## 2021-03-07 ENCOUNTER — Ambulatory Visit: Payer: Medicare HMO | Admitting: Registered Nurse

## 2021-03-07 DIAGNOSIS — E669 Obesity, unspecified: Secondary | ICD-10-CM | POA: Insufficient documentation

## 2021-03-07 DIAGNOSIS — F32A Depression, unspecified: Secondary | ICD-10-CM | POA: Diagnosis not present

## 2021-03-07 DIAGNOSIS — Z8614 Personal history of Methicillin resistant Staphylococcus aureus infection: Secondary | ICD-10-CM | POA: Insufficient documentation

## 2021-03-07 DIAGNOSIS — Z79899 Other long term (current) drug therapy: Secondary | ICD-10-CM | POA: Insufficient documentation

## 2021-03-07 DIAGNOSIS — Z6841 Body Mass Index (BMI) 40.0 and over, adult: Secondary | ICD-10-CM | POA: Diagnosis not present

## 2021-03-07 DIAGNOSIS — K5730 Diverticulosis of large intestine without perforation or abscess without bleeding: Secondary | ICD-10-CM | POA: Insufficient documentation

## 2021-03-07 DIAGNOSIS — Z7982 Long term (current) use of aspirin: Secondary | ICD-10-CM | POA: Insufficient documentation

## 2021-03-07 DIAGNOSIS — D124 Benign neoplasm of descending colon: Secondary | ICD-10-CM | POA: Diagnosis not present

## 2021-03-07 DIAGNOSIS — E1122 Type 2 diabetes mellitus with diabetic chronic kidney disease: Secondary | ICD-10-CM | POA: Diagnosis not present

## 2021-03-07 DIAGNOSIS — D123 Benign neoplasm of transverse colon: Secondary | ICD-10-CM | POA: Insufficient documentation

## 2021-03-07 DIAGNOSIS — D122 Benign neoplasm of ascending colon: Secondary | ICD-10-CM | POA: Insufficient documentation

## 2021-03-07 DIAGNOSIS — I120 Hypertensive chronic kidney disease with stage 5 chronic kidney disease or end stage renal disease: Secondary | ICD-10-CM | POA: Insufficient documentation

## 2021-03-07 DIAGNOSIS — R195 Other fecal abnormalities: Secondary | ICD-10-CM | POA: Insufficient documentation

## 2021-03-07 DIAGNOSIS — Z1211 Encounter for screening for malignant neoplasm of colon: Secondary | ICD-10-CM | POA: Diagnosis not present

## 2021-03-07 DIAGNOSIS — K635 Polyp of colon: Secondary | ICD-10-CM | POA: Diagnosis not present

## 2021-03-07 DIAGNOSIS — D126 Benign neoplasm of colon, unspecified: Secondary | ICD-10-CM | POA: Diagnosis not present

## 2021-03-07 DIAGNOSIS — N183 Chronic kidney disease, stage 3 unspecified: Secondary | ICD-10-CM | POA: Insufficient documentation

## 2021-03-07 DIAGNOSIS — Z992 Dependence on renal dialysis: Secondary | ICD-10-CM | POA: Diagnosis not present

## 2021-03-07 HISTORY — PX: COLONOSCOPY WITH PROPOFOL: SHX5780

## 2021-03-07 SURGERY — COLONOSCOPY WITH PROPOFOL
Anesthesia: General

## 2021-03-07 MED ORDER — LIDOCAINE HCL (CARDIAC) PF 100 MG/5ML IV SOSY
PREFILLED_SYRINGE | INTRAVENOUS | Status: DC | PRN
  Administered 2021-03-07: 80 mg via INTRAVENOUS

## 2021-03-07 MED ORDER — METOPROLOL TARTRATE 5 MG/5ML IV SOLN
INTRAVENOUS | Status: DC | PRN
  Administered 2021-03-07: 2 mg via INTRAVENOUS

## 2021-03-07 MED ORDER — PROPOFOL 500 MG/50ML IV EMUL
INTRAVENOUS | Status: DC | PRN
  Administered 2021-03-07: 150 ug/kg/min via INTRAVENOUS

## 2021-03-07 MED ORDER — DEXMEDETOMIDINE (PRECEDEX) IN NS 20 MCG/5ML (4 MCG/ML) IV SYRINGE
PREFILLED_SYRINGE | INTRAVENOUS | Status: DC | PRN
  Administered 2021-03-07: 20 ug via INTRAVENOUS

## 2021-03-07 MED ORDER — PROPOFOL 10 MG/ML IV BOLUS
INTRAVENOUS | Status: DC | PRN
  Administered 2021-03-07: 90 mg via INTRAVENOUS

## 2021-03-07 MED ORDER — SODIUM CHLORIDE 0.9 % IV SOLN: INTRAVENOUS | Status: DC

## 2021-03-07 NOTE — Op Note (Signed)
[facility] Patient Name: Shiya Fogelman Procedure Date: 03/07/2021 9:46 AM MRN: [id number] Account #: [id number] Date of Birth: [birth year] Admit Type: Outpatient Age: 70 Room: [facility] ENDO ROOM 3 Gender: Female Note Status: Finalized Instrument Name: Jasper Riling [id number] Procedure:             Colonoscopy Indications:           Positive fecal immunochemical test Providers:             Chastin Riesgo B. Bonna Gains MD, MD Referring MD:          Venia Carbon (Referring MD) Medicines:             Monitored Anesthesia Care Complications:         No immediate complications. Procedure:             Pre-Anesthesia Assessment:                        - ASA Grade Assessment: II - A patient with mild                         systemic disease.                        - Prior to the procedure, a History and Physical was                         performed, and patient medications, allergies and                         sensitivities were reviewed. The patient's tolerance                         of previous anesthesia was reviewed.                        - The risks and benefits of the procedure and the                         sedation options and risks were discussed with the                         patient. All questions were answered and informed                         consent was obtained.                        - Patient identification and proposed procedure were                         verified prior to the procedure by the physician, the                         nurse, the anesthesiologist, the anesthetist and the                         technician. The procedure was verified in the  procedure room.                        After obtaining informed consent, the colonoscope was                         passed under direct vision. Throughout the procedure,                         the patient's blood pressure, pulse, and oxygen                          saturations were monitored continuously. The                         Colonoscope was introduced through the anus and                         advanced to the the cecum, identified by appendiceal                         orifice and ileocecal valve. The colonoscopy was                         performed with ease. The patient tolerated the                         procedure well. The quality of the bowel preparation                         was good. Findings:      The perianal and digital rectal examinations were normal.      A 5 mm polyp was found in the transverse colon. The polyp was sessile.       The polyp was removed with a cold snare. Resection and retrieval were       complete.      A 10 mm polyp was found in the ascending colon. The polyp was       semi-pedunculated. The polyp was removed with a piecemeal technique       using a hot snare. Resection and retrieval were complete. To prevent       bleeding after the polypectomy, one hemostatic clip was successfully       placed. There was no bleeding at the end of the procedure.      A 10 mm polyp was found in the ascending colon. The polyp was sessile.       The polyp was removed with a hot snare. Resection and retrieval were       complete. To prevent bleeding after the polypectomy, one hemostatic clip       was successfully placed. There was no bleeding at the end of the       procedure.      Two sessile polyps were found in the ascending colon. The polyps were 5       to 6 mm in size. These polyps were removed with a cold snare. Resection       and retrieval were complete.      A 3 mm polyp was found in the descending colon. The polyp was sessile.       The polyp  was removed with a cold snare. Resection and retrieval were       complete.      Multiple diverticula were found in the sigmoid colon.      The exam was otherwise without abnormality.      The retroflexed view of the distal rectum and anal verge was normal and        showed no anal or rectal abnormalities. Impression:            - One 5 mm polyp in the transverse colon, removed with                         a cold snare. Resected and retrieved.                        - One 10 mm polyp in the ascending colon, removed                         piecemeal using a hot snare. Resected and retrieved.                         Clip was placed.                        - One 10 mm polyp in the ascending colon, removed with                         a hot snare. Resected and retrieved. Clip was placed.                        - Two 5 to 6 mm polyps in the ascending colon, removed                         with a cold snare. Resected and retrieved.                        - One 3 mm polyp in the descending colon, removed with                         a cold snare. Resected and retrieved.                        - Diverticulosis in the sigmoid colon.                        - The examination was otherwise normal.                        - The distal rectum and anal verge are normal on                         retroflexion view. Recommendation:        - Discharge patient to home (with escort).                        - Advance diet as tolerated.                        - Continue present  medications.                        - Await pathology results.                        - Repeat colonoscopy date to be determined after                         pending pathology results are reviewed.                        - The findings and recommendations were discussed with                         the patient.                        - The findings and recommendations were discussed with                         the patient's family.                        - Return to primary care physician as previously                         scheduled.                        - High fiber diet. Procedure Code(s):     --- Professional ---                        [id number], Colonoscopy, flexible; with removal of                          tumor(s), polyp(s), or other lesion(s) by snare                         technique Diagnosis Code(s):     --- Professional ---                        K63.5, Polyp of colon                        R19.5, Other fecal abnormalities CPT copyright 2019 American Medical Association. All rights reserved. The codes documented in this report are preliminary and upon coder review may  be revised to meet current compliance requirements.  Vonda Antigua, MD Margretta Sidle B. Bonna Gains MD, MD 03/07/2021 10:40:12 AM This report has been signed electronically. Number of Addenda: 0 Note Initiated On: 03/07/2021 9:46 AM Scope Withdrawal Time: 0 hours 23 minutes 13 seconds  Total Procedure Duration: 0 hours 29 minutes 50 seconds  Estimated Blood Loss:  Estimated blood loss: none.      [facility]

## 2021-03-07 NOTE — H&P (Signed)
Vonda Antigua, MD [address], [place], [place], [place], [address] [address], [place], [place], [place], [address] Phone: [phone]  Fax: [phone]  Primary Care Physician:  Venia Carbon, MD   Pre-Procedure History & Physical: HPI:  Carrie [name] is a 70 y.o. female is here for a colonoscopy.   Past Medical History:  Diagnosis Date   Allergy    Anemia    Arthritis    "right knee" (10/26'/2017)   CKD (chronic kidney disease), stage III ([place])    12/2015 (Dr. 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   Type II diabetes mellitus ([place]) 2011   on metformin until yr ago- no med now - off due to kidneys    Past Surgical History:  Procedure Laterality Date   A/V FISTULAGRAM Left 03/10/2019   Procedure: A/V FISTULAGRAM;  Surgeon: Katha Cabal, MD;  Location: [facility];  Service: Cardiovascular;  Laterality: Left;   A/V FISTULAGRAM Left 07/14/2019   Procedure: A/V FISTULAGRAM;  Surgeon: Katha Cabal, MD;  Location: [facility];  Service: Cardiovascular;  Laterality: Left;   A/V FISTULAGRAM Left 01/07/2020   Procedure: A/V FISTULAGRAM;  Surgeon: Katha Cabal, MD;  Location: [facility];  Service: Cardiovascular;  Laterality: Left;   AV FISTULA PLACEMENT Left 11/07/2018   Procedure: ARTERIOVENOUS (AV) FISTULA CREATION ( BRACHIO BASILIC ) VS BRACHIAL AXILLARY GRAFT;  Surgeon: Katha Cabal, MD;  Location: [facility];  Service: Vascular;  Laterality: Left;   BRAIN MENINGIOMA EXCISION  08/2006   Foramina magnum meningioma   CERVICAL DISCECTOMY  1996   Dr. Alric Seton   CESAREAN [place]   COLONOSCOPY     DIALYSIS/PERMA CATHETER INSERTION N/A 08/04/2018   Procedure: DIALYSIS/PERMA CATHETER INSERTION;  Surgeon: Algernon Huxley, MD;  Location: [facility];  Service:  Cardiovascular;  Laterality: N/A;   DIALYSIS/PERMA CATHETER REMOVAL N/A 12/25/2018   Procedure: DIALYSIS/PERMA CATHETER REMOVAL;  Surgeon: Algernon Huxley, MD;  Location: [facility] [facility];  Service: Cardiovascular;  Laterality: N/A;   DILATION AND CURETTAGE OF UTERUS     JOINT REPLACEMENT     right knee left hip   Right wrist x-ray  2007   Deg. at 1st MCP   TOTAL HIP ARTHROPLASTY Left 03/27/2016   Procedure: LEFT TOTAL HIP ARTHROPLASTY ANTERIOR APPROACH;  Surgeon: Mcarthur Rossetti, MD;  Location: [facility];  Service: Orthopedics;  Laterality: Left;   TOTAL KNEE ARTHROPLASTY Right 04/30/2017   Procedure: RIGHT TOTAL KNEE ARTHROPLASTY;  Surgeon: Mcarthur Rossetti, MD;  Location: [facility];  Service: Orthopedics;  Laterality: Right;   TUBAL LIGATION  1981    Prior to Admission medications   Medication Sig Start Date End Date Taking? Authorizing Provider  albuterol (VENTOLIN HFA) 108 (90 Base) MCG/ACT inhaler Inhale 2 puffs into the lungs as needed. 11/14/20  Yes [provider]  amLODipine (NORVASC) 5 MG tablet Take 5 mg by mouth daily. 01/27/21  Yes [provider]  aspirin EC 81 MG tablet Take 81 mg by mouth daily.   Yes [provider]  atorvastatin (LIPITOR) 40 MG tablet TAKE 40 MG BY MOUTH ONCE DAILY Patient taking differently: Take 40 mg by mouth daily. 03/11/18  Yes Venia Carbon, MD  levETIRAcetam [facility])  1000 MG tablet Take 1 tablet (1,000 mg total) by mouth 2 (two) times daily. 02/17/21  Yes Venia Carbon, MD  LORazepam (ATIVAN) 0.5 MG tablet Take 1 tablet (0.5 mg total) by mouth 2 (two) times daily as needed for anxiety. 02/07/21  Yes Venia Carbon, MD  losartan (COZAAR) 25 MG tablet Take 25 mg by mouth every evening. Take 2 if over 140 or over 90.   Yes [provider]  metoprolol tartrate (LOPRESSOR) 25 MG tablet Take 25 mg by mouth every evening.  01/06/19  Yes [provider]  multivitamin (RENA-VIT) TABS tablet Take 1 tablet  by mouth at bedtime. 08/07/18  Yes Wieting, Richard, MD  omeprazole (PRILOSEC) 20 MG capsule Take 1 capsule (20 mg total) by mouth daily. 02/07/21  Yes Venia Carbon, MD  sevelamer carbonate (RENVELA) 800 MG tablet Take 800-1,600 mg by mouth See admin instructions. Take 2 tablets (1600 mg) by mouth with each meal & 1 tablet (800 mg) by mouth with each snack 10/06/18  Yes [provider]  torsemide (DEMADEX) 20 MG tablet Take 20 mg by mouth 4 (four) times a week. Take 1 tablet (20 mg) by mouth in the morning on Tuesdays, Thursdays, Saturdays, & Sundays in the morning on non-dialysis days. 01/11/19  Yes [provider]  acetaminophen (TYLENOL) 500 MG tablet Take 1,000 mg by mouth every 6 (six) hours as needed (for pain.).     [provider]  lidocaine-prilocaine (EMLA) cream Apply 1 application topically every Monday, Wednesday, and Friday with hemodialysis.    [provider]    Allergies as of 02/14/2021 - Review Complete 02/09/2021  Allergen Reaction Noted   Naproxen sodium Anaphylaxis and Swelling 09/26/2006   Sulfamethoxazole-trimethoprim Anaphylaxis and Swelling 09/26/2006    Family History  Problem Relation Age of Onset   Hypertension Other        Strong family history    Breast cancer Mother 59   Heart failure Sister    Heart disease Sister     Social History   Socioeconomic History   Marital status: Widowed    Spouse name: Not on file   Number of children: 1   Years of education: Not on file   Highest education level: Not on file  Occupational History   Occupation: Formerly Psychologist, forensic, 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      No living will   Requests niece, [name], as health care POA   Would accept resuscitation attempts   No tube feeds if cognitively aware   Social Determinants of Health   Financial Resource Strain: Low Risk    Difficulty of Paying Living Expenses: Not hard at all  Food Insecurity: No Food Insecurity   Worried About Charity fundraiser in the Last Year: Never true   [place] in the Last Year: Never true  Transportation Needs: No Transportation Needs   Lack of Transportation (Medical): No   Lack of Transportation (Non-Medical): No  Physical Activity: Not on file  Stress: Not on file  Social Connections: Not on file  Intimate Partner Violence: Not on file    Review of Systems: See HPI, otherwise negative ROS  Physical Exam: Constitutional: General:   Alert,  Well-developed, well-nourished, pleasant and cooperative in NAD BP (!) 197/90   Pulse 82   Temp (!) 97.4 F (36.3 C) (Temporal)   Resp 16   Ht 5' 2.5" (1.588 m)   Wt 110.7 kg   SpO2 96%   BMI 43.92 kg/m   Head: Normocephalic, atraumatic.   Eyes:  Sclera clear, no icterus.   Conjunctiva pink.   Mouth:  No deformity or lesions, oropharynx pink & moist.  Neck:  Supple, trachea midline  Respiratory: Normal respiratory effort  Gastrointestinal:  Soft, non-tender and non-distended without masses, hepatosplenomegaly or hernias noted.  No guarding or rebound tenderness.     Cardiac: No clubbing or edema.  No cyanosis. Normal posterior tibial pedal pulses noted.  Lymphatic:  No significant cervical adenopathy.  Psych:  Alert and cooperative. Normal mood and affect.  Musculoskeletal:   Symmetrical without gross deformities. 5/5 Lower extremity strength bilaterally.  Skin: Warm. Intact without significant lesions or rashes. No jaundice.  Neurologic:  Face symmetrical, tongue midline, Normal sensation to touch;  grossly normal neurologically.  Psych:   Alert and oriented x3, Alert and cooperative. Normal mood and affect.  Impression/Plan: Carrie [name] is here for a colonoscopy to be performed for positive FIT test.  Risks, benefits, limitations, and alternatives regarding  colonoscopy have been reviewed with the patient.  Questions have been answered.  All parties agreeable.   Virgel Manifold, MD  03/07/2021, 9:49 AM

## 2021-03-07 NOTE — Anesthesia Postprocedure Evaluation (Signed)
Anesthesia Post Note  Patient: Carrie [name]  Procedure(s) Performed: COLONOSCOPY WITH PROPOFOL  Patient location during evaluation: Endoscopy Anesthesia Type: General Level of consciousness: awake and alert and oriented Pain management: pain level controlled Vital Signs Assessment: post-procedure vital signs reviewed and stable Respiratory status: spontaneous breathing, nonlabored ventilation and respiratory function stable Cardiovascular status: blood pressure returned to baseline and stable Postop Assessment: no signs of nausea or vomiting Anesthetic complications: no   No notable events documented.   Last Vitals:  Vitals:   03/07/21 1050 03/07/21 1100  BP: (!) 156/79 (!) 145/77  Pulse: 71 66  Resp: 19 20  Temp:    SpO2: 100% 100%    Last Pain:  Vitals:   03/07/21 1036  TempSrc: Temporal  PainSc:                  Nhung Danko

## 2021-03-07 NOTE — Transfer of Care (Signed)
Immediate Anesthesia Transfer of Care Note  Patient: Carrie [name]  Procedure(s) Performed: COLONOSCOPY WITH PROPOFOL  Patient Location: Endoscopy Unit  Anesthesia Type:General  Level of Consciousness: drowsy  Airway & Oxygen Therapy: Patient Spontanous Breathing  Post-op Assessment: Report given to RN and Post -op Vital signs reviewed and stable  Post vital signs: Reviewed and stable  Last Vitals:  Vitals Value Taken Time  BP 136/61 03/07/21 1036  Temp    Pulse 82 03/07/21 1037  Resp 12 03/07/21 1037  SpO2 97 % 03/07/21 1037  Vitals shown include unvalidated device data.  Last Pain:  Vitals:   03/07/21 1036  TempSrc: Temporal  PainSc:          Complications: No notable events documented.

## 2021-03-07 NOTE — Anesthesia Preprocedure Evaluation (Signed)
Anesthesia Evaluation  Patient identified by MRN, date of birth, ID band Patient awake    Reviewed: Allergy & Precautions, NPO status , Patient's Chart, lab work & pertinent test results  History of Anesthesia Complications Negative for: history of anesthetic complications  Airway Mallampati: II  TM Distance: >3 FB Neck ROM: Full    Dental  (+) Poor Dentition   Pulmonary neg pulmonary ROS, neg sleep apnea, neg COPD,    breath sounds clear to auscultation- rhonchi (-) wheezing      Cardiovascular hypertension, Pt. on medications (-) CAD, (-) Past MI, (-) Cardiac Stents and (-) CABG  Rhythm:Regular Rate:Normal - Systolic murmurs and - Diastolic murmurs    Neuro/Psych  Headaches, neg Seizures PSYCHIATRIC DISORDERS Depression    GI/Hepatic negative GI ROS, Neg liver ROS,   Endo/Other  diabetes  Renal/GU ESRF and DialysisRenal disease     Musculoskeletal  (+) Arthritis ,   Abdominal (+) + obese,   Peds  Hematology  (+) anemia ,   Anesthesia Other Findings Past Medical History: No date: Allergy No date: Anemia No date: Arthritis     Comment:  "right knee" (10/26'/2017) No date: CKD (chronic kidney disease), stage III ([place])     Comment:  12/2015 (Dr. Lavonia Dana) No date: Depression 2008: History of blood transfusion     Comment:  "when I had brain tumor surgery" 2008: History of MRSA infection No date: Hypertension No date: Meningioma [facility])     Comment:  Foramen magnum 2011: Type II diabetes mellitus ([place])     Comment:  on metformin until yr ago- no med now - off due to               kidneys   Reproductive/Obstetrics                             Anesthesia Physical Anesthesia Plan  ASA: 4  Anesthesia Plan: General   Post-op Pain Management:    Induction: Intravenous  PONV Risk Score and Plan: 2 and Propofol infusion  Airway Management Planned: Natural Airway  Additional  Equipment:   Intra-op Plan:   Post-operative Plan:   Informed Consent: I have reviewed the patients History and Physical, chart, labs and discussed the procedure including the risks, benefits and alternatives for the proposed anesthesia with the patient or authorized representative who has indicated his/her understanding and acceptance.     Dental advisory given  Plan Discussed with: CRNA and Anesthesiologist  Anesthesia Plan Comments:         Anesthesia Quick Evaluation

## 2021-03-08 ENCOUNTER — Encounter: Payer: Self-pay | Admitting: Gastroenterology

## 2021-03-08 DIAGNOSIS — N186 End stage renal disease: Secondary | ICD-10-CM | POA: Diagnosis not present

## 2021-03-08 DIAGNOSIS — Z992 Dependence on renal dialysis: Secondary | ICD-10-CM | POA: Diagnosis not present

## 2021-03-08 LAB — SURGICAL PATHOLOGY

## 2021-03-09 ENCOUNTER — Encounter: Payer: Self-pay | Admitting: Gastroenterology

## 2021-03-10 DIAGNOSIS — N186 End stage renal disease: Secondary | ICD-10-CM | POA: Diagnosis not present

## 2021-03-10 DIAGNOSIS — Z992 Dependence on renal dialysis: Secondary | ICD-10-CM | POA: Diagnosis not present

## 2021-03-13 ENCOUNTER — Ambulatory Visit (INDEPENDENT_AMBULATORY_CARE_PROVIDER_SITE_OTHER): Payer: Medicare HMO

## 2021-03-13 DIAGNOSIS — I10 Essential (primary) hypertension: Secondary | ICD-10-CM

## 2021-03-13 DIAGNOSIS — N186 End stage renal disease: Secondary | ICD-10-CM

## 2021-03-13 DIAGNOSIS — Z992 Dependence on renal dialysis: Secondary | ICD-10-CM | POA: Diagnosis not present

## 2021-03-13 NOTE — Chronic Care Management (AMB) (Signed)
Chronic Care Management   CCM RN Visit Note  03/13/2021 Name: Carrie [name] MRN: [id number] DOB: 1951/01/03  Subjective: Carrie [name] is a 70 y.o. year old female who is a primary care patient of Venia Carbon, MD. The care management team was consulted for assistance with disease management and care coordination needs.    Engaged with patient by telephone for follow up visit in response to provider referral for case management and/or care coordination services.   Consent to Services:  The patient was given information about Chronic Care Management services, agreed to services, and gave verbal consent prior to initiation of services.  Please see initial visit note for detailed documentation.   Patient agreed to services and verbal consent obtained.   Assessment: Review of patient past medical history, allergies, medications, health status, including review of consultants reports, laboratory and other test data, was performed as part of comprehensive evaluation and provision of chronic care management services.   SDOH (Social Determinants of Health) assessments and interventions performed:    CCM Care Plan  Allergies  Allergen Reactions   Naproxen Sodium Anaphylaxis and Swelling   Sulfamethoxazole-Trimethoprim Anaphylaxis and Swelling    Outpatient Encounter Medications as of 03/13/2021  Medication Sig   acetaminophen (TYLENOL) 500 MG tablet Take 1,000 mg by mouth every 6 (six) hours as needed (for pain.).    albuterol (VENTOLIN HFA) 108 (90 Base) MCG/ACT inhaler Inhale 2 puffs into the lungs as needed.   amLODipine (NORVASC) 5 MG tablet Take 5 mg by mouth daily.   aspirin EC 81 MG tablet Take 81 mg by mouth daily.   atorvastatin (LIPITOR) 40 MG tablet TAKE 40 MG BY MOUTH ONCE DAILY (Patient taking differently: Take 40 mg by mouth daily.)   levETIRAcetam (KEPPRA) 1000 MG tablet Take 1 tablet (1,000 mg total) by mouth 2 (two) times daily.   lidocaine-prilocaine (EMLA)  cream Apply 1 application topically every Monday, Wednesday, and Friday with hemodialysis.   LORazepam (ATIVAN) 0.5 MG tablet Take 1 tablet (0.5 mg total) by mouth 2 (two) times daily as needed for anxiety.   losartan (COZAAR) 25 MG tablet Take 25 mg by mouth every evening. Take 2 if over 140 or over 90.   metoprolol tartrate (LOPRESSOR) 25 MG tablet Take 25 mg by mouth every evening.    multivitamin (RENA-VIT) TABS tablet Take 1 tablet by mouth at bedtime.   omeprazole (PRILOSEC) 20 MG capsule Take 1 capsule (20 mg total) by mouth daily.   sevelamer carbonate (RENVELA) 800 MG tablet Take 800-1,600 mg by mouth See admin instructions. Take 2 tablets (1600 mg) by mouth with each meal & 1 tablet (800 mg) by mouth with each snack   torsemide (DEMADEX) 20 MG tablet Take 20 mg by mouth 4 (four) times a week. Take 1 tablet (20 mg) by mouth in the morning on Tuesdays, Thursdays, Saturdays, & Sundays in the morning on non-dialysis days.   No facility-administered encounter medications on file as of 03/13/2021.    Patient Active Problem List   Diagnosis Date Noted   Positive FIT (fecal immunochemical test)    Polyp of ascending colon    Polyp of descending colon    Polyp of transverse colon    Dysphagia 02/07/2021   Tremor 12/01/2020   Headache 12/01/2020   Eustachian tube dysfunction 10/25/2020   History of shingles 09/26/2020   Primary osteoarthritis of left knee 29/52/8413   Complication from renal dialysis device 11/20/2018   ESRD (end stage renal disease) (  [place]) 08/04/2018   Chronic diastolic CHF (congestive heart failure) ([place]) 11/20/2017   Cough 10/01/2017   Status post total right knee replacement 04/30/2017   Unilateral primary osteoarthritis, right knee 03/18/2017   History of total hip replacement, left 03/18/2017   Unilateral primary osteoarthritis, left hip 03/27/2016   Status post total replacement of left hip 03/27/2016   Mood disorder ([place]) 04/01/2015   Osteoarthritis of right  knee 02/03/2014   Advanced directives, counseling/discussion 02/03/2014   Routine general medical examination at a health care facility 07/08/2012   BMI 40.0-44.9, adult ([place]) 07/08/2012   Type 2 diabetes, controlled, with renal manifestation ([place])    HYPERCHOLESTEROLEMIA 09/26/2006   Essential hypertension, benign 09/26/2006   ALLERGIC RHINITIS 09/26/2006   History of meningioma 08/03/2006    Conditions to be addressed/monitored:HTN and ESRD  Care Plan : Hypertension (Adult)  Updates made by Dannielle Karvonen, RN since 03/13/2021 12:00 AM     Problem: Knowledge deficit related to long term care plan for management of Hypertension in dialysis patient   Priority: High     Long-Range Goal: Hypertension monitored and managed   Start Date: 12/06/2020  Expected End Date: 06/02/2021  This Visit's Progress: On track  Recent Progress: Not on track  Priority: High  Note:   Objective:  Last practice recorded BP readings:  BP Readings from Last 3 Encounters:  03/07/21 (!) 145/77  02/07/21 (!) 180/90  12/01/20 (!) 150/98  Current Barriers:  Knowledge Deficits related to long term care plan for self management of patient with:  ESRD / Hypertension.   Patient states she continues to monitor her blood pressures at home on her off dialysis day. She states her blood pressures have begin to come down. She reports her blood pressures have been in the 160/70's with her home monitor. She states, " that is good for me.  It's been much higher."  She reports the headaches have gotten better since her blood pressure has come down some.   Patient states she has lost approximately 11 lbs which she is very excited about.  Patient states she continues to adhere to her low salt diet and states she has been cutting back on her portion sizes. Patient reports having her annual wellness visit with her primary care provider on 03/09/2021.  Case Manager Clinical Goal(s):  patient will verbalize understanding of plan for  hypertension management patient will attend scheduled medical appointments: patient will demonstrate improved health management independence as evidenced by checking blood pressure as directed and notifying her provider for elevated or low readings, taking all medications as prescribed, and adhering to a low sodium /DASH diet as discussed. Interventions:  Collaboration with Venia Carbon, MD regarding development and update of comprehensive plan of care as evidenced by provider attestation and co-signature Inter-disciplinary care team collaboration (see longitudinal plan of care) Evaluation of current treatment plan related to hypertension self management and patient's adherence to plan as established by provider. Reviewed medications with patient and discussed importance of compliance Discussed plans with patient for ongoing care management follow up and provided patient with direct contact information for care management team Advised patient, providing education and rationale, to monitor blood pressure 3 x per week and record, calling PCP for findings outside established parameters.  Reviewed scheduled/upcoming provider appointments   Self-Care Activities: - Self administers medications as prescribed Attends all scheduled provider appointments Calls provider office for new concerns, questions, or BP outside discussed parameters Checks BP and records as discussed Follows a low  sodium diet/DASH diet Patient Goals: - Continue to check blood pressure at least 3 times per week and record.   - write blood pressure results in a log or diary (take log to your appointments to share information with your provider) -- Call your provider for new or ongoing symptoms.  - Continue to take your medications as prescribed and refill them timely. -  Continue to follow a low sodium/ DASH diet and adhere to fluid restriction as recommended by your doctor.   Follow Up Plan: The patient has been provided with  contact information for the care management team and has been advised to call with any health related questions or concerns.  The care management team will reach out to the patient again over the next 1-2 months.           Plan:The patient has been provided with contact information for the care management team and has been advised to call with any health related questions or concerns.  and The care management team will reach out to the patient again over the next 1-2 months Quinn Plowman RN,BSN,CCM RN Case Manager [place]  [phone]

## 2021-03-13 NOTE — Patient Instructions (Signed)
Visit Information:  Thank you for taking the time to speak with me today.   PATIENT GOALS:  Goals Addressed             This Visit's Progress    Track and Manage My Blood Pressure-Hypertension   On track    Timeframe:  Long-Range Goal Priority:  High Start Date:   12/06/2020                          Expected End Date:   06/02/2021                   Follow Up Date 05/22/2021  - Continue to check blood pressure at least 3 times per week and record.   - write blood pressure results in a log or diary (take log to your appointments to share information with your provider) -- Call your provider for new or ongoing symptoms.  - Continue to take your medications as prescribed and refill them timely. -  Continue to follow a low sodium/ DASH diet and adhere to fluid restriction as recommended by your doctor.      Why is this important?   You won't feel high blood pressure, but it can still hurt your blood vessels.  High blood pressure can cause heart or kidney problems. It can also cause a stroke.  Making lifestyle changes like losing a little weight or eating less salt will help.  Checking your blood pressure at home and at different times of the day can help to control blood pressure.  If the doctor prescribes medicine remember to take it the way the doctor ordered.  Call the office if you cannot afford the medicine or if there are questions about it.             Patient verbalizes understanding of instructions provided today and agrees to view in [place].   The patient has been provided with contact information for the care management team and has been advised to call with any health related questions or concerns.  The care management team will reach out to the patient again over the next 1-2 months    Quinn Plowman RN,BSN,CCM RN Case Manager [facility]  [phone]

## 2021-03-15 DIAGNOSIS — Z992 Dependence on renal dialysis: Secondary | ICD-10-CM | POA: Diagnosis not present

## 2021-03-15 DIAGNOSIS — N186 End stage renal disease: Secondary | ICD-10-CM | POA: Diagnosis not present

## 2021-03-17 DIAGNOSIS — N186 End stage renal disease: Secondary | ICD-10-CM | POA: Diagnosis not present

## 2021-03-17 DIAGNOSIS — Z992 Dependence on renal dialysis: Secondary | ICD-10-CM | POA: Diagnosis not present

## 2021-03-20 DIAGNOSIS — N186 End stage renal disease: Secondary | ICD-10-CM | POA: Diagnosis not present

## 2021-03-20 DIAGNOSIS — Z992 Dependence on renal dialysis: Secondary | ICD-10-CM | POA: Diagnosis not present

## 2021-03-22 DIAGNOSIS — N186 End stage renal disease: Secondary | ICD-10-CM | POA: Diagnosis not present

## 2021-03-22 DIAGNOSIS — Z992 Dependence on renal dialysis: Secondary | ICD-10-CM | POA: Diagnosis not present

## 2021-03-24 DIAGNOSIS — Z992 Dependence on renal dialysis: Secondary | ICD-10-CM | POA: Diagnosis not present

## 2021-03-24 DIAGNOSIS — N186 End stage renal disease: Secondary | ICD-10-CM | POA: Diagnosis not present

## 2021-03-27 DIAGNOSIS — E119 Type 2 diabetes mellitus without complications: Secondary | ICD-10-CM | POA: Diagnosis not present

## 2021-03-27 DIAGNOSIS — Z992 Dependence on renal dialysis: Secondary | ICD-10-CM | POA: Diagnosis not present

## 2021-03-27 DIAGNOSIS — N186 End stage renal disease: Secondary | ICD-10-CM | POA: Diagnosis not present

## 2021-03-29 ENCOUNTER — Other Ambulatory Visit: Payer: Self-pay | Admitting: Internal Medicine

## 2021-03-29 DIAGNOSIS — Z992 Dependence on renal dialysis: Secondary | ICD-10-CM | POA: Diagnosis not present

## 2021-03-29 DIAGNOSIS — N186 End stage renal disease: Secondary | ICD-10-CM | POA: Diagnosis not present

## 2021-03-29 MED ORDER — LORAZEPAM 0.5 MG PO TABS: 0.5000 mg | ORAL_TABLET | Freq: Two times a day (BID) | ORAL | 0 refills | Status: DC | PRN

## 2021-03-29 NOTE — Telephone Encounter (Signed)
  Encourage patient to contact the pharmacy for refills or they can request refills through [place]:  Please schedule appointment if longer than 1 year  NEXT APPOINTMENT DATE:  MEDICATION:LORazepam (ATIVAN) 0.5 MG tablet  Is the patient out of medication?   PHARMACY:[id number] [name], [place] - [address]    Let patient know to contact pharmacy at the end of the day to make sure medication is ready.  Please notify patient to allow 48-72 hours to process  CLINICAL FILLS OUT ALL BELOW:   LAST REFILL:  QTY:  REFILL DATE:    OTHER COMMENTS:    Okay for refill?  Please advise

## 2021-03-29 NOTE — Telephone Encounter (Signed)
Last filled 02-07-21 #60 Last OV 02-07-21 Next OV 02-20-22 [facility]

## 2021-03-31 DIAGNOSIS — N186 End stage renal disease: Secondary | ICD-10-CM | POA: Diagnosis not present

## 2021-03-31 DIAGNOSIS — Z992 Dependence on renal dialysis: Secondary | ICD-10-CM | POA: Diagnosis not present

## 2021-04-03 DIAGNOSIS — N186 End stage renal disease: Secondary | ICD-10-CM | POA: Diagnosis not present

## 2021-04-03 DIAGNOSIS — I10 Essential (primary) hypertension: Secondary | ICD-10-CM | POA: Diagnosis not present

## 2021-04-03 DIAGNOSIS — Z992 Dependence on renal dialysis: Secondary | ICD-10-CM | POA: Diagnosis not present

## 2021-04-05 DIAGNOSIS — Z992 Dependence on renal dialysis: Secondary | ICD-10-CM | POA: Diagnosis not present

## 2021-04-05 DIAGNOSIS — N186 End stage renal disease: Secondary | ICD-10-CM | POA: Diagnosis not present

## 2021-04-07 DIAGNOSIS — N186 End stage renal disease: Secondary | ICD-10-CM | POA: Diagnosis not present

## 2021-04-07 DIAGNOSIS — Z992 Dependence on renal dialysis: Secondary | ICD-10-CM | POA: Diagnosis not present

## 2021-04-10 ENCOUNTER — Other Ambulatory Visit (INDEPENDENT_AMBULATORY_CARE_PROVIDER_SITE_OTHER): Payer: Self-pay | Admitting: Nurse Practitioner

## 2021-04-10 DIAGNOSIS — N186 End stage renal disease: Secondary | ICD-10-CM | POA: Diagnosis not present

## 2021-04-10 DIAGNOSIS — Z992 Dependence on renal dialysis: Secondary | ICD-10-CM | POA: Diagnosis not present

## 2021-04-11 ENCOUNTER — Ambulatory Visit (INDEPENDENT_AMBULATORY_CARE_PROVIDER_SITE_OTHER): Payer: Medicare HMO

## 2021-04-11 DIAGNOSIS — I5032 Chronic diastolic (congestive) heart failure: Secondary | ICD-10-CM

## 2021-04-11 DIAGNOSIS — I10 Essential (primary) hypertension: Secondary | ICD-10-CM

## 2021-04-11 DIAGNOSIS — G8929 Other chronic pain: Secondary | ICD-10-CM

## 2021-04-11 DIAGNOSIS — N186 End stage renal disease: Secondary | ICD-10-CM

## 2021-04-11 NOTE — Patient Instructions (Addendum)
Visit Information: Thank you for taking the time to speak with me today.  Your next follow up appointment with the RN case manager is May 15, 2021 at 1:00pm.   Patient Goals/Self-Care Activities: Patient will self administer medications as prescribed as evidenced by self report/primary caregiver report  Patient will attend all scheduled provider appointments as evidenced by clinician review of documented attendance to scheduled appointments and patient/caregiver report Patient will call provider office for new concerns or questions as evidenced by review of documented incoming telephone call notes and patient report Patient will check blood pressure at least 3 times per week and record. write blood pressure results in a log or diary (take log to your appointments to share information with your provider) Continue to follow a low sodium/ DASH diet and adhere to fluid restriction as recommended by your doctor.   Patient will weigh at least 2-3 times per week and record.    Patient verbalizes understanding of instructions provided today and agrees to view in [place].   The patient has been provided with contact information for the care management team and has been advised to call with any health related questions or concerns.  The care management team will reach out to the patient in 45 days.   Quinn Plowman RN,BSN,CCM RN Case Manager [place]  [phone]

## 2021-04-11 NOTE — Chronic Care Management (AMB) (Signed)
Chronic Care Management   CCM RN Visit Note  04/12/2021 Name: Carrie [name] MRN: [id number] DOB: 09/16/1950  Subjective: Carrie [name] is a 70 y.o. year old female who is a primary care patient of Venia Carbon, MD. The care management team was consulted for assistance with disease management and care coordination needs.    Engaged with patient by telephone for follow up visit in response to provider referral for case management and/or care coordination services.   Consent to Services:  The patient was given information about Chronic Care Management services, agreed to services, and gave verbal consent prior to initiation of services.  Please see initial visit note for detailed documentation.   Patient agreed to services and verbal consent obtained.   Assessment: Review of patient past medical history, allergies, medications, health status, including review of consultants reports, laboratory and other test data, was performed as part of comprehensive evaluation and provision of chronic care management services.   SDOH (Social Determinants of Health) assessments and interventions performed:    CCM Care Plan  Allergies  Allergen Reactions   Naproxen Sodium Anaphylaxis and Swelling   Sulfamethoxazole-Trimethoprim Anaphylaxis and Swelling    Outpatient Encounter Medications as of 04/11/2021  Medication Sig   acetaminophen (TYLENOL) 500 MG tablet Take 1,000 mg by mouth every 6 (six) hours as needed (for pain.).    albuterol (VENTOLIN HFA) 108 (90 Base) MCG/ACT inhaler Inhale 2 puffs into the lungs as needed.   amLODipine (NORVASC) 5 MG tablet Take 5 mg by mouth daily.   aspirin EC 81 MG tablet Take 81 mg by mouth daily.   atorvastatin (LIPITOR) 40 MG tablet TAKE 40 MG BY MOUTH ONCE DAILY (Patient taking differently: Take 40 mg by mouth daily.)   levETIRAcetam (KEPPRA) 1000 MG tablet Take 1 tablet (1,000 mg total) by mouth 2 (two) times daily.   lidocaine-prilocaine (EMLA)  cream Apply 1 application topically every Monday, Wednesday, and Friday with hemodialysis.   LORazepam (ATIVAN) 0.5 MG tablet Take 1 tablet (0.5 mg total) by mouth 2 (two) times daily as needed for anxiety.   losartan (COZAAR) 25 MG tablet Take 25 mg by mouth every evening. Take 2 if over 140 or over 90.   metoprolol tartrate (LOPRESSOR) 25 MG tablet Take 25 mg by mouth every evening.    multivitamin (RENA-VIT) TABS tablet Take 1 tablet by mouth at bedtime.   omeprazole (PRILOSEC) 20 MG capsule Take 1 capsule (20 mg total) by mouth daily.   sevelamer carbonate (RENVELA) 800 MG tablet Take 800-1,600 mg by mouth See admin instructions. Take 2 tablets (1600 mg) by mouth with each meal & 1 tablet (800 mg) by mouth with each snack   torsemide (DEMADEX) 20 MG tablet Take 20 mg by mouth 4 (four) times a week. Take 1 tablet (20 mg) by mouth in the morning on Tuesdays, Thursdays, Saturdays, & Sundays in the morning on non-dialysis days.   No facility-administered encounter medications on file as of 04/11/2021.    Patient Active Problem List   Diagnosis Date Noted   Positive FIT (fecal immunochemical test)    Polyp of ascending colon    Polyp of descending colon    Polyp of transverse colon    Dysphagia 02/07/2021   Tremor 12/01/2020   Headache 12/01/2020   Eustachian tube dysfunction 10/25/2020   History of shingles 09/26/2020   Primary osteoarthritis of left knee 13/01/6577   Complication from renal dialysis device 11/20/2018   ESRD (end stage renal disease) (  [place]) 08/04/2018   Chronic diastolic CHF (congestive heart failure) ([place]) 11/20/2017   Cough 10/01/2017   Status post total right knee replacement 04/30/2017   Unilateral primary osteoarthritis, right knee 03/18/2017   History of total hip replacement, left 03/18/2017   Unilateral primary osteoarthritis, left hip 03/27/2016   Status post total replacement of left hip 03/27/2016   Mood disorder ([place]) 04/01/2015   Osteoarthritis of right  knee 02/03/2014   Advanced directives, counseling/discussion 02/03/2014   Routine general medical examination at a health care facility 07/08/2012   BMI 40.0-44.9, adult ([place]) 07/08/2012   Type 2 diabetes, controlled, with renal manifestation ([place])    HYPERCHOLESTEROLEMIA 09/26/2006   Essential hypertension, benign 09/26/2006   ALLERGIC RHINITIS 09/26/2006   History of meningioma 08/03/2006    Conditions to be addressed/monitored:CHF, HTN, and ESRD, Chronic pain   Care Plan : RN Case Manager Plan of care  Updates made by Dannielle Karvonen, RN since 04/11/2021 12:00 AM     Problem: Knowledge deficit related to long term care plan for management of chronic conditions ( HTN, HF, ESRD)   Priority: High     Long-Range Goal: Chronic conditions monitored and managed ( HTN, HF, ESRD   Start Date: 04/11/2021  Expected End Date: 08/01/2021  Priority: High  Note:   Current Barriers:  Knowledge Deficits related to plan of care for management of CHF, HTN, and ESRD  Chronic Disease Management support and education needs related to CHF, HTN, and ESRD Patient reports blood pressure readings: 177/88, 173/88, 185/90, 192/86, 196/81.  Patient reports decrease in headaches. Patient reports she is having ongoing knee pain.  She reports wearing a knee brace and taking over the counter tylenol.  She states she has to use her cane now due to the pain.  Patient states she saw the orthopedic doctor and they are unable to do surgery to her knee due to her health conditions.  Patient states she refused cortisone injection.  RNCM Clinical Goal(s):  Patient will verbalize basic understanding of CHF, HTN, and ESRD disease process and self health management plan as evidenced by weighing at least 3 times per week, taking medications as prescribed, monitoring blood pressure at least 2-3 days per week and recording.  take all medications exactly as prescribed and will call provider for medication related questions as  evidenced by      attend all scheduled medical appointments:   as evidenced by patient reporting they have attended appointments.         continue to work with Consulting civil engineer and/or Social Worker to address care management and care coordination needs related to CHF, HTN, and ESRD as evidenced by adherence to CM Team Scheduled appointments     through collaboration with Consulting civil engineer, provider, and care team.   Interventions: 1:1 collaboration with primary care provider regarding development and update of comprehensive plan of care as evidenced by provider attestation and co-signature Inter-disciplinary care team collaboration (see longitudinal plan of care) Evaluation of current treatment plan related to  self management and patient's adherence to plan as established by provider  Hypertension Interventions: Goal on track Last practice recorded BP readings:  BP Readings from Last 3 Encounters:  03/07/21 (!) 145/77  02/07/21 (!) 180/90  12/01/20 (!) 150/98  Most recent eGFR/CrCl: No results found for: EGFR  No components found for: CRCL  Evaluation of current treatment plan related to hypertension self management and patient's adherence to plan as established by provider; Provided education to patient  re: stroke prevention, s/s of heart attack and stroke; Reviewed medications with patient and discussed importance of compliance; Discussed plans with patient for ongoing care management follow up and provided patient with direct contact information for care management team; Reviewed scheduled/upcoming provider appointments including:  Evaluation of current treatment plan related to CHF, HTN, and ESRD,  self-management and patient's adherence to plan as established by provider. Discussed plans with patient for ongoing care management follow up and provided patient with direct contact information for care management team  Pain Interventions: Pain assessment performed Medications  reviewed Discussed importance of adherence to all scheduled medical appointments; Counseled on the importance of reporting any/all new or changed pain symptoms or management strategies to pain management provider; Discussed use of relaxation techniques and/or diversional activities to assist with pain reduction (distraction, imagery, relaxation, massage, acupressure, TENS, heat, and cold application; Reviewed with patient prescribed pharmacological and nonpharmacological pain relief strategies;  ESRD Interventions:  Goal on track  Reviewed medications with patient and discussed  ; Reviewed scheduled/upcoming provider appointments including; Discussed plans with patient for ongoing care management follow up and provided patient with direct contact information for care management team;  Heart Failure Interventions: New Goal Provided education on low sodium diet; Reviewed Heart Failure Action Plan in depth and provided written copy; Assessed need for readable accurate scales in home; Discussed importance of daily weight and advised patient to weigh and record daily; Reviewed role of diuretics in prevention of fluid overload and management of heart failure; Discussed the importance of keeping all appointments with provider;  Reviewed medications and discussed  Patient Goals/Self-Care Activities: Patient will self administer medications as prescribed as evidenced by self report/primary caregiver report  Patient will attend all scheduled provider appointments as evidenced by clinician review of documented attendance to scheduled appointments and patient/caregiver report Patient will call provider office for new concerns or questions as evidenced by review of documented incoming telephone call notes and patient report Patient will check blood pressure at least 3 times per week and record. write blood pressure results in a log or diary (take log to your appointments to share information with your  provider) Continue to follow a low sodium/ DASH diet and adhere to fluid restriction as recommended by your doctor.   Patient will weigh at least 2-3 times per week and record.   Elevate leg when sitting due to knee pain.  Apply ice/ heat for pain management.      Plan:The patient has been provided with contact information for the care management team and has been advised to call with any health related questions or concerns.  The care management team will reach out to the patient again over the next 45 days. Quinn Plowman RN,BSN,CCM RN Case Manager [place]  [phone]

## 2021-04-12 DIAGNOSIS — N186 End stage renal disease: Secondary | ICD-10-CM | POA: Diagnosis not present

## 2021-04-12 DIAGNOSIS — Z992 Dependence on renal dialysis: Secondary | ICD-10-CM | POA: Diagnosis not present

## 2021-04-12 NOTE — Progress Notes (Signed)
MRN : [id number]  Carrie [name] is a 70 y.o. (December 09, 1950) female who presents with chief complaint of check access.  History of Present Illness:   The function of the access has been stable. The patient denies increased bleeding time or increased recirculation. Patient denies difficulty with cannulation consistently, however she notes that there are only 2 people at the center that she allow [place] to access because they are the only ones that consistently do well.  The patient denies hand pain or other symptoms consistent with steal phenomena.  No significant arm swelling.   The patient denies redness or swelling at the access site. The patient denies fever or chills at home or while on dialysis.   The patient denies amaurosis fugax or recent TIA symptoms. There are no recent neurological changes noted. The patient denies claudication symptoms or rest pain symptoms. The patient denies history of DVT, PE or superficial thrombophlebitis. The patient denies recent episodes of angina or shortness of breath.      Today the patient has a flow volume of 1358 cc/min.  (Previous flow volume of 1033 cc per minute) there is 1 area near the arterial anastomosis that demonstrates an increase in velocities but this does not appear to be a hemodynamically significant stenosis at this time.  The previously placed stents are open and patent.  No outpatient medications have been marked as taking for the 04/13/21 encounter (Appointment) with Delana Meyer, Dolores Lory, MD.    Past Medical History:  Diagnosis Date   Allergy    Anemia    Arthritis    "right knee" (10/26'/2017)   CKD (chronic kidney disease), stage III ([place])    12/2015 (Dr. 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   Type II diabetes mellitus ([place]) 2011   on metformin until yr ago- no med now -  off due to kidneys    Past Surgical History:  Procedure Laterality Date   A/V FISTULAGRAM Left 03/10/2019   Procedure: A/V FISTULAGRAM;  Surgeon: Katha Cabal, MD;  Location: [facility];  Service: Cardiovascular;  Laterality: Left;   A/V FISTULAGRAM Left 07/14/2019   Procedure: A/V FISTULAGRAM;  Surgeon: Katha Cabal, MD;  Location: [facility];  Service: Cardiovascular;  Laterality: Left;   A/V FISTULAGRAM Left 01/07/2020   Procedure: A/V FISTULAGRAM;  Surgeon: Katha Cabal, MD;  Location: [facility];  Service: Cardiovascular;  Laterality: Left;   AV FISTULA PLACEMENT Left 11/07/2018   Procedure: ARTERIOVENOUS (AV) FISTULA CREATION ( BRACHIO BASILIC ) VS BRACHIAL AXILLARY GRAFT;  Surgeon: Katha Cabal, MD;  Location: [facility];  Service: Vascular;  Laterality: Left;   BRAIN MENINGIOMA EXCISION  08/2006   Foramina magnum meningioma   CERVICAL DISCECTOMY  1996   Dr. Alric Seton   CESAREAN SECTION  1981   COLONOSCOPY     COLONOSCOPY WITH PROPOFOL N/A 03/07/2021   Procedure: COLONOSCOPY WITH PROPOFOL;  Surgeon: Virgel Manifold, MD;  Location: [facility];  Service: Endoscopy;  Laterality: N/A;   DIALYSIS/PERMA CATHETER INSERTION N/A 08/04/2018   Procedure: DIALYSIS/PERMA CATHETER INSERTION;  Surgeon: Algernon Huxley, MD;  Location: [facility];  Service: Cardiovascular;  Laterality: N/A;   DIALYSIS/PERMA CATHETER REMOVAL N/A 12/25/2018   Procedure: DIALYSIS/PERMA CATHETER REMOVAL;  Surgeon: Lucky Cowboy,  Erskine Squibb, MD;  Location: [facility];  Service: Cardiovascular;  Laterality: N/A;   DILATION AND CURETTAGE OF UTERUS     JOINT REPLACEMENT     right knee left hip   Right wrist x-ray  2007   Deg. at 1st MCP   TOTAL HIP ARTHROPLASTY Left 03/27/2016   Procedure: LEFT TOTAL HIP ARTHROPLASTY ANTERIOR APPROACH;  Surgeon: Mcarthur Rossetti, MD;  Location: [facility];  Service: Orthopedics;  Laterality: Left;   TOTAL KNEE ARTHROPLASTY Right  04/30/2017   Procedure: RIGHT TOTAL KNEE ARTHROPLASTY;  Surgeon: Mcarthur Rossetti, MD;  Location: [place];  Service: Orthopedics;  Laterality: Right;   TUBAL LIGATION  1981    Social History Social History   Tobacco Use   Smoking status: Never   Smokeless tobacco: Never  Vaping Use   Vaping Use: Never used  Substance Use Topics   Alcohol use: No    Alcohol/week: 0.0 standard drinks   Drug use: No    Family History Family History  Problem Relation Age of Onset   Hypertension Other        Strong family history    Breast cancer Mother 24   Heart failure Sister    Heart disease Sister     Allergies  Allergen Reactions   Naproxen Sodium Anaphylaxis and Swelling   Sulfamethoxazole-Trimethoprim Anaphylaxis and Swelling     REVIEW OF SYSTEMS (Negative unless checked)  Constitutional: [] Weight loss  [] Fever  [] Chills Cardiac: [] Chest pain   [] Chest pressure   [] Palpitations   [] Shortness of breath when laying flat   [] Shortness of breath with exertion. Vascular:  [] Pain in legs with walking   [] Pain in legs at rest  [] History of DVT   [] Phlebitis   [] Swelling in legs   [] Varicose veins   [] Non-healing ulcers Pulmonary:   [] Uses home oxygen   [] Productive cough   [] Hemoptysis   [] Wheeze  [] COPD   [] Asthma Neurologic:  [] Dizziness   [] Seizures   [] History of stroke   [] History of TIA  [] Aphasia   [] Vissual changes   [] Weakness or numbness in arm   [] Weakness or numbness in leg Musculoskeletal:   [] Joint swelling   [] Joint pain   [] Low back pain Hematologic:  [] Easy bruising  [] Easy bleeding   [] Hypercoagulable state   [] Anemic Gastrointestinal:  [] Diarrhea   [] Vomiting  [] Gastroesophageal reflux/heartburn   [] Difficulty swallowing. Genitourinary:  [x] Chronic kidney disease   [] Difficult urination  [] Frequent urination   [] Blood in urine Skin:  [] Rashes   [] Ulcers  Psychological:  [] History of anxiety   []  History of major depression.  Physical Examination  There were no  vitals filed for this visit. There is no height or weight on file to calculate BMI. Gen: WD/WN, NAD Head: [place]/AT, No temporalis wasting.  Ear/Nose/Throat: Hearing grossly intact, nares w/o erythema or drainage Eyes: PER, EOMI, sclera nonicteric.  Neck: Supple, no gross masses or lesions.  No JVD.  Pulmonary:  Good air movement, no audible wheezing, no use of accessory muscles.  Cardiac: RRR, precordium non-hyperdynamic. Vascular:    Left brachial axillary AV graft with excellent thrill and bruit.  Skin over the graft appears healthy.  No significant aneurysmal deterioration. Vessel Right Left  Radial Palpable Trace palpable  Brachial Palpable Palpable  Gastrointestinal: soft, non-distended. No guarding/no peritoneal signs.  Musculoskeletal: M/S 5/5 throughout.  No deformity.  Neurologic: CN 2-12 intact. Pain and light touch intact in extremities.  Symmetrical.  Speech is fluent. Motor exam as listed above.  Psychiatric: Judgment intact, Mood & affect appropriate for pt's clinical situation. Dermatologic: No rashes or ulcers noted.  No changes consistent with cellulitis.   CBC Lab Results  Component Value Date   WBC 5.8 11/04/2018   HGB 13.3 11/07/2018   HCT 39.0 11/07/2018   MCV 93.4 11/04/2018   PLT 278 11/04/2018    BMET    Component Value Date/Time   NA 137 11/07/2018 0641   K 4.6 01/07/2020 1140   CL 99 11/04/2018 1208   CO2 28 11/04/2018 1208   GLUCOSE 109 (H) 11/07/2018 0641   BUN 36 (H) 11/04/2018 1208   BUN 36 (A) 07/08/2017 0000   CREATININE 5.46 (H) 11/04/2018 1208   CALCIUM 9.0 11/04/2018 1208   GFRNONAA 7 (L) 11/04/2018 1208   GFRAA 9 (L) 11/04/2018 1208   CrCl cannot be calculated (Patient's most recent lab result is older than the maximum 21 days allowed.).  COAG Lab Results  Component Value Date   INR 1.0 11/04/2018   INR 1.61 05/03/2017   INR 1.37 05/02/2017    Radiology No results found.   Assessment/Plan 1. ESRD (end stage renal disease)  ([place]) Recommend:  The patient is doing well and currently has adequate dialysis access. The patient's dialysis center is not reporting any access issues. Flow pattern is relatively stable when compared to the prior ultrasound.  I do not believe that angiography is warranted at this time however given the modest increase in velocities near the arterial anastomosis I will see her back in 3 months without a duplex ultrasound to check and see whether there are increasing problems with cannulation.  The patient should have a duplex ultrasound of the dialysis access in 6 months if her access continues to function without issue.  The patient will follow-up with me in the office after each ultrasound     2. Essential hypertension, benign Continue antihypertensive medications as already ordered, these medications have been reviewed and there are no changes at this time.   3. Controlled type 2 diabetes mellitus with diabetic nephropathy, without long-term current use of insulin ([place]) Continue hypoglycemic medications as already ordered, these medications have been reviewed and there are no changes at this time.  Hgb A1C to be monitored as already arranged by primary service   4. HYPERCHOLESTEROLEMIA Continue statin as ordered and reviewed, no changes at this time     [name], MD  04/12/2021 10:21 AM

## 2021-04-13 ENCOUNTER — Other Ambulatory Visit: Payer: Self-pay

## 2021-04-13 ENCOUNTER — Ambulatory Visit (INDEPENDENT_AMBULATORY_CARE_PROVIDER_SITE_OTHER): Payer: Medicare HMO | Admitting: Vascular Surgery

## 2021-04-13 ENCOUNTER — Ambulatory Visit (INDEPENDENT_AMBULATORY_CARE_PROVIDER_SITE_OTHER): Payer: Medicare HMO

## 2021-04-13 ENCOUNTER — Encounter (INDEPENDENT_AMBULATORY_CARE_PROVIDER_SITE_OTHER): Payer: Self-pay | Admitting: Vascular Surgery

## 2021-04-13 VITALS — BP 202/82 | HR 101 | Resp 16 | Wt 234.6 lb

## 2021-04-13 DIAGNOSIS — E7800 Pure hypercholesterolemia, unspecified: Secondary | ICD-10-CM | POA: Diagnosis not present

## 2021-04-13 DIAGNOSIS — I10 Essential (primary) hypertension: Secondary | ICD-10-CM | POA: Diagnosis not present

## 2021-04-13 DIAGNOSIS — E1121 Type 2 diabetes mellitus with diabetic nephropathy: Secondary | ICD-10-CM | POA: Diagnosis not present

## 2021-04-13 DIAGNOSIS — N186 End stage renal disease: Secondary | ICD-10-CM

## 2021-04-14 DIAGNOSIS — Z992 Dependence on renal dialysis: Secondary | ICD-10-CM | POA: Diagnosis not present

## 2021-04-14 DIAGNOSIS — N186 End stage renal disease: Secondary | ICD-10-CM | POA: Diagnosis not present

## 2021-04-17 DIAGNOSIS — Z992 Dependence on renal dialysis: Secondary | ICD-10-CM | POA: Diagnosis not present

## 2021-04-17 DIAGNOSIS — N186 End stage renal disease: Secondary | ICD-10-CM | POA: Diagnosis not present

## 2021-04-19 DIAGNOSIS — Z992 Dependence on renal dialysis: Secondary | ICD-10-CM | POA: Diagnosis not present

## 2021-04-19 DIAGNOSIS — N186 End stage renal disease: Secondary | ICD-10-CM | POA: Diagnosis not present

## 2021-04-21 DIAGNOSIS — N186 End stage renal disease: Secondary | ICD-10-CM | POA: Diagnosis not present

## 2021-04-21 DIAGNOSIS — Z992 Dependence on renal dialysis: Secondary | ICD-10-CM | POA: Diagnosis not present

## 2021-04-24 DIAGNOSIS — Z992 Dependence on renal dialysis: Secondary | ICD-10-CM | POA: Diagnosis not present

## 2021-04-24 DIAGNOSIS — N186 End stage renal disease: Secondary | ICD-10-CM | POA: Diagnosis not present

## 2021-04-26 DIAGNOSIS — N186 End stage renal disease: Secondary | ICD-10-CM | POA: Diagnosis not present

## 2021-04-26 DIAGNOSIS — Z992 Dependence on renal dialysis: Secondary | ICD-10-CM | POA: Diagnosis not present

## 2021-04-28 DIAGNOSIS — Z992 Dependence on renal dialysis: Secondary | ICD-10-CM | POA: Diagnosis not present

## 2021-04-28 DIAGNOSIS — N186 End stage renal disease: Secondary | ICD-10-CM | POA: Diagnosis not present

## 2021-05-01 ENCOUNTER — Other Ambulatory Visit: Payer: Self-pay

## 2021-05-01 ENCOUNTER — Emergency Department: Payer: Medicare HMO

## 2021-05-01 ENCOUNTER — Encounter: Payer: Self-pay | Admitting: Emergency Medicine

## 2021-05-01 ENCOUNTER — Inpatient Hospital Stay
Admission: EM | Admit: 2021-05-01 | Discharge: 2021-05-05 | DRG: 291 | Disposition: A | Discharge status: Home / Self Care | Payer: Medicare HMO | Attending: Internal Medicine | Admitting: Internal Medicine

## 2021-05-01 ENCOUNTER — Telehealth: Payer: Medicare HMO | Admitting: Nurse Practitioner

## 2021-05-01 DIAGNOSIS — D649 Anemia, unspecified: Secondary | ICD-10-CM | POA: Diagnosis not present

## 2021-05-01 DIAGNOSIS — R0602 Shortness of breath: Secondary | ICD-10-CM | POA: Diagnosis not present

## 2021-05-01 DIAGNOSIS — I120 Hypertensive chronic kidney disease with stage 5 chronic kidney disease or end stage renal disease: Secondary | ICD-10-CM | POA: Diagnosis not present

## 2021-05-01 DIAGNOSIS — Z86011 Personal history of benign neoplasm of the brain: Secondary | ICD-10-CM

## 2021-05-01 DIAGNOSIS — Z992 Dependence on renal dialysis: Secondary | ICD-10-CM | POA: Diagnosis not present

## 2021-05-01 DIAGNOSIS — E785 Hyperlipidemia, unspecified: Secondary | ICD-10-CM | POA: Diagnosis present

## 2021-05-01 DIAGNOSIS — I517 Cardiomegaly: Secondary | ICD-10-CM | POA: Diagnosis not present

## 2021-05-01 DIAGNOSIS — I5032 Chronic diastolic (congestive) heart failure: Secondary | ICD-10-CM

## 2021-05-01 DIAGNOSIS — Z882 Allergy status to sulfonamides status: Secondary | ICD-10-CM | POA: Diagnosis not present

## 2021-05-01 DIAGNOSIS — Z79899 Other long term (current) drug therapy: Secondary | ICD-10-CM

## 2021-05-01 DIAGNOSIS — G40909 Epilepsy, unspecified, not intractable, without status epilepticus: Secondary | ICD-10-CM | POA: Diagnosis not present

## 2021-05-01 DIAGNOSIS — N186 End stage renal disease: Secondary | ICD-10-CM

## 2021-05-01 DIAGNOSIS — Z7982 Long term (current) use of aspirin: Secondary | ICD-10-CM

## 2021-05-01 DIAGNOSIS — N2581 Secondary hyperparathyroidism of renal origin: Secondary | ICD-10-CM | POA: Diagnosis not present

## 2021-05-01 DIAGNOSIS — Z96651 Presence of right artificial knee joint: Secondary | ICD-10-CM | POA: Diagnosis present

## 2021-05-01 DIAGNOSIS — J9811 Atelectasis: Secondary | ICD-10-CM | POA: Diagnosis present

## 2021-05-01 DIAGNOSIS — I5021 Acute systolic (congestive) heart failure: Secondary | ICD-10-CM | POA: Diagnosis not present

## 2021-05-01 DIAGNOSIS — Z20822 Contact with and (suspected) exposure to covid-19: Secondary | ICD-10-CM | POA: Diagnosis not present

## 2021-05-01 DIAGNOSIS — I132 Hypertensive heart and chronic kidney disease with heart failure and with stage 5 chronic kidney disease, or end stage renal disease: Principal | ICD-10-CM | POA: Diagnosis present

## 2021-05-01 DIAGNOSIS — J9691 Respiratory failure, unspecified with hypoxia: Secondary | ICD-10-CM | POA: Diagnosis present

## 2021-05-01 DIAGNOSIS — Z8719 Personal history of other diseases of the digestive system: Secondary | ICD-10-CM | POA: Diagnosis not present

## 2021-05-01 DIAGNOSIS — I10 Essential (primary) hypertension: Secondary | ICD-10-CM | POA: Diagnosis not present

## 2021-05-01 DIAGNOSIS — Z803 Family history of malignant neoplasm of breast: Secondary | ICD-10-CM

## 2021-05-01 DIAGNOSIS — Z888 Allergy status to other drugs, medicaments and biological substances status: Secondary | ICD-10-CM | POA: Diagnosis not present

## 2021-05-01 DIAGNOSIS — D631 Anemia in chronic kidney disease: Secondary | ICD-10-CM | POA: Diagnosis not present

## 2021-05-01 DIAGNOSIS — R609 Edema, unspecified: Secondary | ICD-10-CM

## 2021-05-01 DIAGNOSIS — E1121 Type 2 diabetes mellitus with diabetic nephropathy: Secondary | ICD-10-CM

## 2021-05-01 DIAGNOSIS — I5033 Acute on chronic diastolic (congestive) heart failure: Secondary | ICD-10-CM

## 2021-05-01 DIAGNOSIS — J810 Acute pulmonary edema: Secondary | ICD-10-CM | POA: Diagnosis not present

## 2021-05-01 DIAGNOSIS — J90 Pleural effusion, not elsewhere classified: Secondary | ICD-10-CM | POA: Diagnosis not present

## 2021-05-01 DIAGNOSIS — F32A Depression, unspecified: Secondary | ICD-10-CM | POA: Diagnosis present

## 2021-05-01 DIAGNOSIS — J9601 Acute respiratory failure with hypoxia: Secondary | ICD-10-CM | POA: Diagnosis present

## 2021-05-01 DIAGNOSIS — Z6841 Body Mass Index (BMI) 40.0 and over, adult: Secondary | ICD-10-CM

## 2021-05-01 DIAGNOSIS — Z8249 Family history of ischemic heart disease and other diseases of the circulatory system: Secondary | ICD-10-CM

## 2021-05-01 DIAGNOSIS — E1122 Type 2 diabetes mellitus with diabetic chronic kidney disease: Secondary | ICD-10-CM | POA: Diagnosis present

## 2021-05-01 DIAGNOSIS — I5043 Acute on chronic combined systolic (congestive) and diastolic (congestive) heart failure: Secondary | ICD-10-CM | POA: Diagnosis present

## 2021-05-01 DIAGNOSIS — E1129 Type 2 diabetes mellitus with other diabetic kidney complication: Secondary | ICD-10-CM | POA: Diagnosis present

## 2021-05-01 DIAGNOSIS — R0902 Hypoxemia: Secondary | ICD-10-CM | POA: Diagnosis not present

## 2021-05-01 DIAGNOSIS — Z96642 Presence of left artificial hip joint: Secondary | ICD-10-CM | POA: Diagnosis present

## 2021-05-01 DIAGNOSIS — J811 Chronic pulmonary edema: Secondary | ICD-10-CM | POA: Diagnosis not present

## 2021-05-01 DIAGNOSIS — J9611 Chronic respiratory failure with hypoxia: Secondary | ICD-10-CM | POA: Diagnosis present

## 2021-05-01 DIAGNOSIS — R600 Localized edema: Secondary | ICD-10-CM | POA: Diagnosis not present

## 2021-05-01 LAB — CBC WITH DIFFERENTIAL/PLATELET
Abs Immature Granulocytes: 0.03 10*3/uL (ref 0.00–0.07)
Basophils Absolute: 0 10*3/uL (ref 0.0–0.1)
Basophils Relative: 0 %
Eosinophils Absolute: 0.1 10*3/uL (ref 0.0–0.5)
Eosinophils Relative: 1 %
HCT: 31.2 % — ABNORMAL LOW (ref 36.0–46.0)
Hemoglobin: 9.8 g/dL — ABNORMAL LOW (ref 12.0–15.0)
Immature Granulocytes: 0 %
Lymphocytes Relative: 18 %
Lymphs Abs: 1.2 10*3/uL (ref 0.7–4.0)
MCH: 30.6 pg (ref 26.0–34.0)
MCHC: 31.4 g/dL (ref 30.0–36.0)
MCV: 97.5 fL (ref 80.0–100.0)
Monocytes Absolute: 0.4 10*3/uL (ref 0.1–1.0)
Monocytes Relative: 6 %
Neutro Abs: 5 10*3/uL (ref 1.7–7.7)
Neutrophils Relative %: 75 %
Platelets: 194 10*3/uL (ref 150–400)
RBC: 3.2 MIL/uL — ABNORMAL LOW (ref 3.87–5.11)
RDW: 15.5 % (ref 11.5–15.5)
WBC: 6.8 10*3/uL (ref 4.0–10.5)
nRBC: 0.3 % — ABNORMAL HIGH (ref 0.0–0.2)

## 2021-05-01 LAB — COMPREHENSIVE METABOLIC PANEL
ALT: 14 U/L (ref 0–44)
AST: 20 U/L (ref 15–41)
Albumin: 4 g/dL (ref 3.5–5.0)
Alkaline Phosphatase: 102 U/L (ref 38–126)
Anion gap: 9 (ref 5–15)
BUN: 49 mg/dL — ABNORMAL HIGH (ref 8–23)
CO2: 28 mmol/L (ref 22–32)
Calcium: 8.4 mg/dL — ABNORMAL LOW (ref 8.9–10.3)
Chloride: 102 mmol/L (ref 98–111)
Creatinine, Ser: 10.22 mg/dL — ABNORMAL HIGH (ref 0.44–1.00)
GFR, Estimated: 4 mL/min — ABNORMAL LOW (ref >60)
Glucose, Bld: 109 mg/dL — ABNORMAL HIGH (ref 70–99)
Potassium: 4.3 mmol/L (ref 3.5–5.1)
Sodium: 139 mmol/L (ref 135–145)
Total Bilirubin: 0.7 mg/dL (ref 0.3–1.2)
Total Protein: 7.9 g/dL (ref 6.5–8.1)

## 2021-05-01 LAB — PROTIME-INR
INR: 1 (ref 0.8–1.2)
Prothrombin Time: 13.6 seconds (ref 11.4–15.2)

## 2021-05-01 LAB — RESP PANEL BY RT-PCR (FLU A&B, COVID) ARPGX2
Influenza A by PCR: NEGATIVE
Influenza B by PCR: NEGATIVE
SARS Coronavirus 2 by RT PCR: NEGATIVE

## 2021-05-01 LAB — BRAIN NATRIURETIC PEPTIDE: B Natriuretic Peptide: 463.6 pg/mL — ABNORMAL HIGH (ref 0.0–100.0)

## 2021-05-01 LAB — HEPATITIS B SURFACE ANTIBODY,QUALITATIVE: Hep B S Ab: REACTIVE — AB

## 2021-05-01 LAB — TROPONIN I (HIGH SENSITIVITY): Troponin I (High Sensitivity): 59 ng/L — ABNORMAL HIGH (ref <18)

## 2021-05-01 LAB — HEPATITIS B SURFACE ANTIGEN: Hepatitis B Surface Ag: NONREACTIVE

## 2021-05-01 MED ORDER — ASPIRIN EC 81 MG PO TBEC
81.0000 mg | DELAYED_RELEASE_TABLET | Freq: Every day | ORAL | Status: DC
  Administered 2021-05-01 – 2021-05-05 (×5): 81 mg via ORAL
  Filled 2021-05-01 (×5): qty 1

## 2021-05-01 MED ORDER — LEVETIRACETAM 500 MG PO TABS
1000.0000 mg | ORAL_TABLET | Freq: Once | ORAL | Status: AC
  Administered 2021-05-01: 20:00:00 1000 mg via ORAL
  Filled 2021-05-01: qty 2

## 2021-05-01 MED ORDER — SODIUM CHLORIDE 0.9 % IV SOLN: 100.0000 mL | INTRAVENOUS | Status: DC | PRN

## 2021-05-01 MED ORDER — ALBUTEROL SULFATE (2.5 MG/3ML) 0.083% IN NEBU: 2.5000 mg | INHALATION_SOLUTION | RESPIRATORY_TRACT | Status: DC | PRN

## 2021-05-01 MED ORDER — SEVELAMER CARBONATE 800 MG PO TABS
1600.0000 mg | ORAL_TABLET | Freq: Three times a day (TID) | ORAL | Status: DC
  Administered 2021-05-01 – 2021-05-05 (×10): 1600 mg via ORAL
  Filled 2021-05-01 (×12): qty 2

## 2021-05-01 MED ORDER — TORSEMIDE 20 MG PO TABS
20.0000 mg | ORAL_TABLET | ORAL | Status: DC
  Filled 2021-05-01: qty 1

## 2021-05-01 MED ORDER — HEPARIN SODIUM (PORCINE) 1000 UNIT/ML DIALYSIS
1000.0000 [IU] | INTRAMUSCULAR | Status: DC | PRN
  Filled 2021-05-01: qty 1

## 2021-05-01 MED ORDER — AMLODIPINE BESYLATE 10 MG PO TABS
10.0000 mg | ORAL_TABLET | Freq: Every day | ORAL | Status: DC
  Administered 2021-05-01 – 2021-05-05 (×5): 10 mg via ORAL
  Filled 2021-05-01: qty 2
  Filled 2021-05-01 (×2): qty 1
  Filled 2021-05-01: qty 2
  Filled 2021-05-01: qty 1

## 2021-05-01 MED ORDER — PANTOPRAZOLE SODIUM 40 MG PO TBEC
40.0000 mg | DELAYED_RELEASE_TABLET | Freq: Every day | ORAL | Status: DC
  Administered 2021-05-01 – 2021-05-05 (×5): 40 mg via ORAL
  Filled 2021-05-01 (×5): qty 1

## 2021-05-01 MED ORDER — HEPARIN SODIUM (PORCINE) 5000 UNIT/ML IJ SOLN
5000.0000 [IU] | Freq: Two times a day (BID) | INTRAMUSCULAR | Status: DC
  Administered 2021-05-01 – 2021-05-04 (×6): 5000 [IU] via SUBCUTANEOUS
  Filled 2021-05-01 (×6): qty 1

## 2021-05-01 MED ORDER — MORPHINE SULFATE (PF) 2 MG/ML IV SOLN: 2.0000 mg | INTRAVENOUS | Status: DC | PRN

## 2021-05-01 MED ORDER — ATORVASTATIN CALCIUM 20 MG PO TABS
40.0000 mg | ORAL_TABLET | Freq: Every day | ORAL | Status: DC
  Administered 2021-05-01 – 2021-05-04 (×4): 40 mg via ORAL
  Filled 2021-05-01 (×4): qty 2

## 2021-05-01 MED ORDER — HYDRALAZINE HCL 20 MG/ML IJ SOLN: 5.0000 mg | INTRAMUSCULAR | Status: DC | PRN

## 2021-05-01 MED ORDER — LIDOCAINE HCL (PF) 1 % IJ SOLN
5.0000 mL | INTRAMUSCULAR | Status: DC | PRN
  Filled 2021-05-01: qty 5

## 2021-05-01 MED ORDER — LIDOCAINE-PRILOCAINE 2.5-2.5 % EX CREA
1.0000 "application " | TOPICAL_CREAM | CUTANEOUS | Status: DC | PRN
  Filled 2021-05-01: qty 5

## 2021-05-01 MED ORDER — CHLORHEXIDINE GLUCONATE CLOTH 2 % EX PADS
6.0000 | MEDICATED_PAD | Freq: Every day | CUTANEOUS | Status: DC
  Administered 2021-05-03 – 2021-05-05 (×3): 6 via TOPICAL
  Filled 2021-05-01 (×3): qty 6

## 2021-05-01 MED ORDER — METOPROLOL SUCCINATE ER 25 MG PO TB24
25.0000 mg | ORAL_TABLET | Freq: Every evening | ORAL | Status: DC
  Administered 2021-05-01 – 2021-05-04 (×4): 25 mg via ORAL
  Filled 2021-05-01 (×4): qty 1

## 2021-05-01 MED ORDER — ACETAMINOPHEN 325 MG PO TABS
650.0000 mg | ORAL_TABLET | Freq: Four times a day (QID) | ORAL | Status: DC | PRN
  Administered 2021-05-02 – 2021-05-03 (×2): 650 mg via ORAL
  Filled 2021-05-01 (×2): qty 2

## 2021-05-01 MED ORDER — LOSARTAN POTASSIUM 50 MG PO TABS
50.0000 mg | ORAL_TABLET | ORAL | Status: DC
  Administered 2021-05-03 – 2021-05-05 (×2): 50 mg via ORAL
  Filled 2021-05-01 (×2): qty 1

## 2021-05-01 MED ORDER — LIDOCAINE-PRILOCAINE 2.5-2.5 % EX CREA
TOPICAL_CREAM | CUTANEOUS | Status: AC
  Administered 2021-05-01: 1 via TOPICAL
  Filled 2021-05-01: qty 5

## 2021-05-01 MED ORDER — HEPARIN SODIUM (PORCINE) 5000 UNIT/ML IJ SOLN: 5000.0000 [IU] | Freq: Three times a day (TID) | INTRAMUSCULAR | Status: DC

## 2021-05-01 MED ORDER — ACETAMINOPHEN 500 MG PO TABS: 1000.0000 mg | ORAL_TABLET | Freq: Four times a day (QID) | ORAL | Status: DC | PRN

## 2021-05-01 MED ORDER — ACETAMINOPHEN 650 MG RE SUPP
650.0000 mg | Freq: Four times a day (QID) | RECTAL | Status: DC | PRN
  Filled 2021-05-01: qty 1

## 2021-05-01 MED ORDER — RENA-VITE PO TABS
1.0000 | ORAL_TABLET | Freq: Every day | ORAL | Status: DC
  Administered 2021-05-03 – 2021-05-04 (×2): 1 via ORAL
  Filled 2021-05-01 (×5): qty 1

## 2021-05-01 MED ORDER — PENTAFLUOROPROP-TETRAFLUOROETH EX AERO
1.0000 "application " | INHALATION_SPRAY | CUTANEOUS | Status: DC | PRN
  Filled 2021-05-01: qty 30

## 2021-05-01 MED ORDER — ONDANSETRON HCL 4 MG/2ML IJ SOLN: 4.0000 mg | Freq: Four times a day (QID) | INTRAMUSCULAR | Status: DC | PRN

## 2021-05-01 MED ORDER — LEVETIRACETAM 500 MG PO TABS
1000.0000 mg | ORAL_TABLET | Freq: Two times a day (BID) | ORAL | Status: DC
  Administered 2021-05-02 – 2021-05-05 (×7): 1000 mg via ORAL
  Filled 2021-05-01 (×7): qty 2

## 2021-05-01 MED ORDER — BISACODYL 5 MG PO TBEC: 5.0000 mg | DELAYED_RELEASE_TABLET | Freq: Every day | ORAL | Status: DC | PRN

## 2021-05-01 MED ORDER — LORAZEPAM 0.5 MG PO TABS
0.5000 mg | ORAL_TABLET | Freq: Two times a day (BID) | ORAL | Status: DC | PRN
  Administered 2021-05-04: 0.5 mg via ORAL
  Filled 2021-05-01: qty 1

## 2021-05-01 MED ORDER — ALTEPLASE 2 MG IJ SOLR
2.0000 mg | Freq: Once | INTRAMUSCULAR | Status: DC | PRN
  Filled 2021-05-01: qty 2

## 2021-05-01 MED ORDER — LOSARTAN POTASSIUM 50 MG PO TABS: 25.0000 mg | ORAL_TABLET | ORAL | Status: DC

## 2021-05-01 MED ORDER — SEVELAMER CARBONATE 800 MG PO TABS
800.0000 mg | ORAL_TABLET | Freq: Two times a day (BID) | ORAL | Status: DC | PRN
  Filled 2021-05-01: qty 1

## 2021-05-01 MED ORDER — LOSARTAN POTASSIUM 25 MG PO TABS
25.0000 mg | ORAL_TABLET | ORAL | Status: DC
  Administered 2021-05-02 – 2021-05-04 (×2): 25 mg via ORAL
  Filled 2021-05-01 (×2): qty 1

## 2021-05-01 MED ORDER — ONDANSETRON HCL 4 MG PO TABS: 4.0000 mg | ORAL_TABLET | Freq: Four times a day (QID) | ORAL | Status: DC | PRN

## 2021-05-01 MED ORDER — SODIUM CHLORIDE 0.9% FLUSH
3.0000 mL | Freq: Two times a day (BID) | INTRAVENOUS | Status: DC
  Administered 2021-05-02 – 2021-05-05 (×6): 3 mL via INTRAVENOUS

## 2021-05-01 NOTE — ED Triage Notes (Addendum)
Pt in from home via [facility] with onset SOB when waking this am.  EMS states RA sats 85%. Pt does have MWF dialysis, last had full session on Friday. Pt states she woke for her dialysis trx this am, and was too sob she had to call EMS. Denies any cp, n/v or recent sickness. Arrives on [facility], sats 99%

## 2021-05-01 NOTE — ED Provider Notes (Signed)
Emergency department handoff note  Care of this patient was signed out to me at the end of the previous provider shift.  All pertinent patient information was conveyed and all questions were answered.  Patient pending reevaluation after dialysis and is still persistently hypoxic and requiring 2 L by nasal cannula to maintain oxygenation.  Repeat chest x-ray shows little change from previous and pulmonary edema and cardiomegaly.  Given these findings and persistent need for supplemental oxygenation, patient will be admitted to the internal medicine service for further evaluation and management.   Naaman Plummer, MD 05/01/21 [phone]

## 2021-05-01 NOTE — ED Provider Notes (Signed)
[facility] Emergency Department Provider Note  ____________________________________________  Time seen: Approximately 10:06 AM  I have reviewed the triage vital signs and the nursing notes.   HISTORY  Chief Complaint Shortness of Breath    HPI Carrie [name] is a 70 y.o. female with a history of end-stage renal disease on hemodialysis Monday Wednesday Friday, hypertension, diabetes who comes ED complaining of shortness of breath that started this morning, constant, gradual onset, worsening.  Noted to have a room air oxygen saturation of 85% by EMS, normalized on 4 to 6 L nasal cannula.  Patient does not use oxygen at home normally.  Symptoms are improved with oxygen.  Patient reports a recent dry cough but no other previous symptoms, no fever, no pain, no vomiting or diarrhea, no body aches.    Past Medical History:  Diagnosis Date  . Allergy   . Anemia   . Arthritis    "right knee" (10/26'/2017)  . CKD (chronic kidney disease), stage III ([place])    12/2015 (Dr. Lavonia Dana)  . Depression   . History of blood transfusion 2008   "when I had brain tumor surgery"  . History of MRSA infection 2008  . Hypertension   . Meningioma ([place])    Foramen magnum  . Type II diabetes mellitus ([place]) 2011   on metformin until yr ago- no med now - off due to kidneys     Patient Active Problem List   Diagnosis Date Noted  . Positive FIT (fecal immunochemical test)   . Polyp of ascending colon   . Polyp of descending colon   . Polyp of transverse colon   . Dysphagia 02/07/2021  . Tremor 12/01/2020  . Headache 12/01/2020  . Eustachian tube dysfunction 10/25/2020  . History of shingles 09/26/2020  . Primary osteoarthritis of left knee 08/22/2020  . Complication from renal dialysis device 11/20/2018  . ESRD (end stage renal disease) ([place]) 08/04/2018  . Chronic diastolic CHF (congestive heart failure) ([place]) 11/20/2017  . Cough 10/01/2017  . Status post  total right knee replacement 04/30/2017  . Unilateral primary osteoarthritis, right knee 03/18/2017  . History of total hip replacement, left 03/18/2017  . Unilateral primary osteoarthritis, left hip 03/27/2016  . Status post total replacement of left hip 03/27/2016  . Mood disorder ([place]) 04/01/2015  . Osteoarthritis of right knee 02/03/2014  . Advanced directives, counseling/discussion 02/03/2014  . Routine general medical examination at a health care facility 07/08/2012  . BMI 40.0-44.9, adult ([place]) 07/08/2012  . Type 2 diabetes, controlled, with renal manifestation ([place])   . HYPERCHOLESTEROLEMIA 09/26/2006  . Essential hypertension, benign 09/26/2006  . ALLERGIC RHINITIS 09/26/2006  . History of meningioma 08/03/2006     Past Surgical History:  Procedure Laterality Date  . A/V FISTULAGRAM Left 03/10/2019   Procedure: A/V FISTULAGRAM;  Surgeon: Katha Cabal, MD;  Location: [place] [facility];  Service: Cardiovascular;  Laterality: Left;  . A/V FISTULAGRAM Left 07/14/2019   Procedure: A/V FISTULAGRAM;  Surgeon: Katha Cabal, MD;  Location: [place] [facility];  Service: Cardiovascular;  Laterality: Left;  . A/V FISTULAGRAM Left 01/07/2020   Procedure: A/V FISTULAGRAM;  Surgeon: Katha Cabal, MD;  Location: [place] [facility];  Service: Cardiovascular;  Laterality: Left;  . AV FISTULA PLACEMENT Left 11/07/2018   Procedure: ARTERIOVENOUS (AV) FISTULA CREATION ( BRACHIO BASILIC ) VS BRACHIAL AXILLARY GRAFT;  Surgeon: Katha Cabal, MD;  Location: [facility];  Service: Vascular;  Laterality: Left;  .  BRAIN MENINGIOMA EXCISION  08/2006   Foramina magnum meningioma  . CERVICAL DISCECTOMY  1996   Dr. Alric Seton  . CESAREAN SECTION  1981  . COLONOSCOPY    . COLONOSCOPY WITH PROPOFOL N/A 03/07/2021   Procedure: COLONOSCOPY WITH PROPOFOL;  Surgeon: Virgel Manifold, MD;  Location: [facility];  Service: Endoscopy;  Laterality: N/A;  . DIALYSIS/PERMA CATHETER INSERTION  N/A 08/04/2018   Procedure: DIALYSIS/PERMA CATHETER INSERTION;  Surgeon: Algernon Huxley, MD;  Location: [place] [facility];  Service: Cardiovascular;  Laterality: N/A;  . DIALYSIS/PERMA CATHETER REMOVAL N/A 12/25/2018   Procedure: DIALYSIS/PERMA CATHETER REMOVAL;  Surgeon: Algernon Huxley, MD;  Location: [place] [facility];  Service: Cardiovascular;  Laterality: N/A;  . DILATION AND CURETTAGE OF UTERUS    . JOINT REPLACEMENT     right knee left hip  . Right wrist x-ray  2007   Deg. at 1st MCP  . TOTAL HIP ARTHROPLASTY Left 03/27/2016   Procedure: LEFT TOTAL HIP ARTHROPLASTY ANTERIOR APPROACH;  Surgeon: Mcarthur Rossetti, MD;  Location: [place];  Service: Orthopedics;  Laterality: Left;  . TOTAL KNEE ARTHROPLASTY Right 04/30/2017   Procedure: RIGHT TOTAL KNEE ARTHROPLASTY;  Surgeon: Mcarthur Rossetti, MD;  Location: [place];  Service: Orthopedics;  Laterality: Right;  . TUBAL LIGATION  1981     Prior to Admission medications   Medication Sig Start Date End Date Taking? Authorizing Provider  acetaminophen (TYLENOL) 500 MG tablet Take 1,000 mg by mouth every 6 (six) hours as needed (for pain.).     [provider]  albuterol (VENTOLIN HFA) 108 (90 Base) MCG/ACT inhaler Inhale 2 puffs into the lungs as needed. 11/14/20   [provider]  amLODipine (NORVASC) 5 MG tablet Take 5 mg by mouth daily. 01/27/21   [provider]  aspirin EC 81 MG tablet Take 81 mg by mouth daily.    [provider]  atorvastatin (LIPITOR) 40 MG tablet TAKE 40 MG BY MOUTH ONCE DAILY Patient taking differently: Take 40 mg by mouth daily. 03/11/18   Venia Carbon, MD  levETIRAcetam (KEPPRA) 1000 MG tablet Take 1 tablet (1,000 mg total) by mouth 2 (two) times daily. 02/17/21   Venia Carbon, MD  lidocaine-prilocaine (EMLA) cream Apply 1 application topically every Monday, Wednesday, and Friday with hemodialysis.    [provider]  LORazepam (ATIVAN) 0.5 MG tablet Take  1 tablet (0.5 mg total) by mouth 2 (two) times daily as needed for anxiety. 03/29/21   Venia Carbon, MD  losartan (COZAAR) 25 MG tablet Take 25 mg by mouth every evening. Take 2 if over 140 or over 90.    [provider]  metoprolol tartrate (LOPRESSOR) 25 MG tablet Take 25 mg by mouth every evening.  01/06/19   [provider]  multivitamin (RENA-VIT) TABS tablet Take 1 tablet by mouth at bedtime. 08/07/18   Loletha Grayer, MD  omeprazole (PRILOSEC) 20 MG capsule Take 1 capsule (20 mg total) by mouth daily. 02/07/21   Venia Carbon, MD  sevelamer carbonate (RENVELA) 800 MG tablet Take 800-1,600 mg by mouth See admin instructions. Take 2 tablets (1600 mg) by mouth with each meal & 1 tablet (800 mg) by mouth with each snack 10/06/18   [provider]  torsemide (DEMADEX) 20 MG tablet Take 20 mg by mouth 4 (four) times a week. Take 1 tablet (20 mg) by mouth in the morning on Tuesdays, Thursdays, Saturdays, & Sundays in the morning on non-dialysis  days. 01/11/19   [provider]     Allergies Naproxen sodium and Sulfamethoxazole-trimethoprim   Family History  Problem Relation Age of Onset  . Hypertension Other        Strong family history   . Breast cancer Mother 55  . Heart failure Sister   . Heart disease Sister     Social History Social History   Tobacco Use  . Smoking status: Never  . Smokeless tobacco: Never  Vaping Use  . Vaping Use: Never used  Substance Use Topics  . Alcohol use: No    Alcohol/week: 0.0 standard drinks  . Drug use: No    Review of Systems  Constitutional:   No fever or chills.  ENT:   No sore throat. No rhinorrhea. Cardiovascular:   No chest pain or syncope. Respiratory:   Positive shortness of breath and cough. Gastrointestinal:   Negative for abdominal pain, vomiting and diarrhea.  Musculoskeletal:   Negative for focal pain or swelling All other systems reviewed and are negative except as documented above in  ROS and HPI.  ____________________________________________   PHYSICAL EXAM:  VITAL SIGNS: ED Triage Vitals  Enc Vitals Group     BP 05/01/21 0938 (!) 177/91     Pulse Rate 05/01/21 0938 94     Resp 05/01/21 0938 20     Temp 05/01/21 0938 98.6 F (37 C)     Temp Source 05/01/21 0938 Oral     SpO2 05/01/21 0938 99 %     Weight 05/01/21 0936 234 lb 9.1 oz (106.4 kg)     Height --      Head Circumference --      Peak Flow --      Pain Score --      Pain Loc --      Pain Edu? --      Excl. in [place]? --     Vital signs reviewed, nursing assessments reviewed.   Constitutional:   Alert and oriented. Non-toxic appearance. Eyes:   Conjunctivae are normal. EOMI. PERRL. ENT      Head:   Normocephalic and atraumatic.      Nose:   Wearing a mask.      Mouth/Throat:   Wearing a mask.      Neck:   No meningismus. Full ROM. Hematological/Lymphatic/Immunilogical:   No cervical lymphadenopathy. Cardiovascular:   RRR. Symmetric bilateral radial and DP pulses.  No murmurs. Cap refill less than 2 seconds. Respiratory:   Normal respiratory effort without tachypnea/retractions.  Diminished breath sounds at bilateral bases.  No crackles or wheezing.   Gastrointestinal:   Soft and nontender. Non distended. There is no CVA tenderness.  No rebound, rigidity, or guarding. Genitourinary:   deferred Musculoskeletal:   Normal range of motion in all extremities. No joint effusions.  No lower extremity tenderness.  No edema. Neurologic:   Normal speech and language.  Motor grossly intact. No acute focal neurologic deficits are appreciated.  Skin:    Skin is warm, dry and intact. No rash noted.  No petechiae, purpura, or bullae.  ____________________________________________    LABS (pertinent positives/negatives) (all labs ordered are listed, but only abnormal results are displayed) Labs Reviewed  COMPREHENSIVE METABOLIC PANEL - Abnormal; Notable for the following components:      Result Value    Glucose, Bld 109 (*)    BUN 49 (*)    Creatinine, Ser 10.22 (*)    Calcium 8.4 (*)    GFR, Estimated 4 (*)  All other components within normal limits  CBC WITH DIFFERENTIAL/PLATELET - Abnormal; Notable for the following components:   RBC 3.20 (*)    Hemoglobin 9.8 (*)    HCT 31.2 (*)    nRBC 0.3 (*)    All other components within normal limits  BRAIN NATRIURETIC PEPTIDE - Abnormal; Notable for the following components:   B Natriuretic Peptide 463.6 (*)    All other components within normal limits  TROPONIN I (HIGH SENSITIVITY) - Abnormal; Notable for the following components:   Troponin I (High Sensitivity) 59 (*)    All other components within normal limits  RESP PANEL BY RT-PCR (FLU A&B, COVID) ARPGX2  PROTIME-INR  HEPATITIS B SURFACE ANTIGEN  HEPATITIS B SURFACE ANTIBODY,QUALITATIVE  HEPATITIS B SURFACE ANTIBODY, QUANTITATIVE  TROPONIN I (HIGH SENSITIVITY)   ____________________________________________   EKG  Interpreted by me  Date: 05/01/2021  Rate: 87  Rhythm: normal sinus rhythm  QRS Axis: normal  Intervals: normal  ST/T Wave abnormalities: normal  Conduction Disutrbances: none  Narrative Interpretation: unremarkable     ____________________________________________    RADIOLOGY  DG Chest 2 View  Result Date: 05/01/2021 CLINICAL DATA:  Shortness of breath, hypoxia, end-stage renal disease, dialysis patient EXAM: CHEST - 2 VIEW COMPARISON:  08/07/2018 FINDINGS: Heart is enlarged with diffuse increased interstitial opacities worse in the lower lobes and small bilateral pleural effusions. Pattern compatible with mild CHF. There is associated bibasilar opacities favored to be atelectasis. Difficult to exclude basilar pneumonia. No pneumothorax. Aorta atherosclerotic. Degenerative changes of the spine. Bones are osteopenic. Left axillary dialysis access stent noted. IMPRESSION: Cardiomegaly with diffuse mild interstitial edema pattern and small effusions  compatible with CHF. Associated bibasilar atelectasis, less likely pneumonia. Aortic Atherosclerosis (ICD10-I70.0). Electronically Signed   By: Jerilynn Mages.  Shick M.D.   On: 05/01/2021 10:27    ____________________________________________   PROCEDURES .Critical Care Performed by: Carrie Mew, MD Authorized by: Carrie Mew, MD   Critical care provider statement:    Critical care time (minutes):  35   Critical care time was exclusive of:  Separately billable procedures and treating other patients   Critical care was necessary to treat or prevent imminent or life-threatening deterioration of the following conditions:  Respiratory failure and renal failure   Critical care was time spent personally by me on the following activities:  Development of treatment plan with patient or surrogate, discussions with consultants, evaluation of patient's response to treatment, examination of patient, obtaining history from patient or surrogate, ordering and performing treatments and interventions, ordering and review of laboratory studies, ordering and review of radiographic studies, pulse oximetry, re-evaluation of patient's condition and review of old charts  ____________________________________________  DIFFERENTIAL DIAGNOSIS   Non-STEMI, pleural effusion, pneumonia, pulmonary edema, ESRD with volume overload  CLINICAL IMPRESSION / ASSESSMENT AND PLAN / ED COURSE  Medications ordered in the ED: Medications  Chlorhexidine Gluconate Cloth 2 % PADS 6 each (has no administration in time range)  pentafluoroprop-tetrafluoroeth (GEBAUERS) aerosol 1 application (has no administration in time range)  lidocaine (PF) (XYLOCAINE) 1 % injection 5 mL (has no administration in time range)  lidocaine-prilocaine (EMLA) cream 1 application (has no administration in time range)  0.9 %  sodium chloride infusion (has no administration in time range)  0.9 %  sodium chloride infusion (has no administration in time  range)  heparin injection 1,000 Units (has no administration in time range)  alteplase (CATHFLO ACTIVASE) injection 2 mg (has no administration in time range)  lidocaine-prilocaine (EMLA) 2.5-2.5 % cream (has  no administration in time range)    Pertinent labs & imaging results that were available during my care of the patient were reviewed by me and considered in my medical decision making (see chart for details).  Carrie [name] was evaluated in Emergency Department on 05/01/2021 for the symptoms described in the history of present illness. She was evaluated in the context of the global COVID-19 pandemic, which necessitated consideration that the patient might be at risk for infection with the SARS-CoV-2 virus that causes COVID-19. Institutional protocols and algorithms that pertain to the evaluation of patients at risk for COVID-19 are in a state of rapid change based on information released by regulatory bodies including the CDC and federal and state organizations. These policies and algorithms were followed during the patient's care in the ED.   Patient presents with acute hypoxic respiratory failure.  Other vitals are normal.  She is not septic.  Will check chest x-ray, labs.  Clinical Course as of 05/01/21 1304  Mon May 01, 2021  1116 Chest x-ray shows pulmonary edema.  Case d/w nephrology Dr. Holley Raring, who will arrange HD today. If labs okay, he feels pt could be safely discharged from ED after dialysis. Will follow up labs and response to HD treatment. [PS]  1303 Work-up unremarkable.  COVID and flu negative.  Normal electrolytes.  Not severely azotemic.  Troponin of 59 likely chronic baseline due to ESRD.  If feeling well after dialysis, I think she would be suitable for ongoing outpatient management, stable for discharge. [PS]    Clinical Course User Index [PS] Carrie Mew, MD     ____________________________________________   FINAL CLINICAL IMPRESSION(S) / ED  DIAGNOSES    Final diagnoses:  Acute pulmonary edema ([place])  Acute respiratory failure with hypoxia ([place])  ESRD on hemodialysis [facility])     ED Discharge Orders     None       Portions of this note were generated with dragon dictation software. Dictation errors may occur despite best attempts at proofreading.    Carrie Mew, MD 05/01/21 1231

## 2021-05-01 NOTE — Progress Notes (Signed)
[facility]  ROUNDING NOTE   Subjective:   Carrie [name] is a 70 year old female with past medical history including anemia, end-stage renal disease on dialysis, depression, diabetes, and hypertension.  She reports to the emergency room with complaints of shortness of breath that began this morning when she awoke.  Patient was scheduled for dialysis today at outpatient clinic, but states her shortness of breath was too bad she called EMS.  Patient is known to our office and receives outpatient dialysis treatments at [facility] on a MWF schedule, supervised by Dr. Juleen China.  She states her breathing was fine overnight.  States she has maintained her 32 ounce fluid restriction.  She did receive a full treatment on Friday.  Denies nausea, vomiting, and diarrhea.  Denies fever or chills.  States shortness of breath has improved since arrival to ED.  No oxygen requirement used at home but currently on 5 L satting 100%.  Lab work unremarkable on arrival.  Chest x-ray shows mild pulmonary edema.  Patient seen later during dialysis with oxygen weaned to 2 L, maintaining sats 100%.   Objective:  Vital signs in last 24 hours:  Temp:  [98.3 F (36.8 C)-98.6 F (37 C)] 98.4 F (36.9 C) (11/28 1636) Pulse Rate:  [86-96] 94 (11/28 1636) Resp:  [20-22] 20 (11/28 1306) BP: (124-183)/(79-94) 175/85 (11/28 1636) SpO2:  [90 %-100 %] 99 % (11/28 1636) Weight:  [103.5 kg-106.4 kg] 103.5 kg (11/28 1657)  Weight change:  Filed Weights   05/01/21 0936 05/01/21 1306 05/01/21 1657  Weight: 106.4 kg 104.5 kg 103.5 kg    Intake/Output: No intake/output data recorded.   Intake/Output this shift:  Total I/O In: -  Out: 1348 [Other:1348]  Physical Exam: General: NAD, sitting up in bed  Head: Normocephalic, atraumatic. Moist oral mucosal membranes  Eyes: Anicteric  Neck: Supple, trachea midline  Lungs:  Clear to auscultation, normal effort, O2 2L [place]  Heart: Regular rate and rhythm   Abdomen:  Soft, nontender, nondistended  Extremities: Trace peripheral edema.  Neurologic: Nonfocal, moving all four extremities  Skin: No lesions  Access: Left upper extremity AVG    Basic Metabolic Panel: Recent Labs  Lab 05/01/21 1043  NA 139  K 4.3  CL 102  CO2 28  GLUCOSE 109*  BUN 49*  CREATININE 10.22*  CALCIUM 8.4*    Liver Function Tests: Recent Labs  Lab 05/01/21 1043  AST 20  ALT 14  ALKPHOS 102  BILITOT 0.7  PROT 7.9  ALBUMIN 4.0   No results for input(s): LIPASE, AMYLASE in the last 168 hours. No results for input(s): AMMONIA in the last 168 hours.  CBC: Recent Labs  Lab 05/01/21 1043  WBC 6.8  NEUTROABS 5.0  HGB 9.8*  HCT 31.2*  MCV 97.5  PLT 194    Cardiac Enzymes: No results for input(s): CKTOTAL, CKMB, CKMBINDEX, TROPONINI in the last 168 hours.  BNP: Invalid input(s): POCBNP  CBG: No results for input(s): GLUCAP in the last 168 hours.  Microbiology: Results for orders placed or performed during the hospital encounter of 05/01/21  Resp Panel by RT-PCR (Flu A&B, Covid) Nasopharyngeal Swab     Status: None   Collection Time: 05/01/21  9:42 AM   Specimen: Nasopharyngeal Swab; Nasopharyngeal(NP) swabs in vial transport medium  Result Value Ref Range Status   SARS Coronavirus 2 by RT PCR NEGATIVE NEGATIVE Final    Comment: (NOTE) SARS-CoV-2 target nucleic acids are NOT DETECTED.  The SARS-CoV-2 RNA is  generally detectable in upper respiratory specimens during the acute phase of infection. The lowest concentration of SARS-CoV-2 viral copies this assay can detect is 138 copies/mL. A negative result does not preclude SARS-Cov-2 infection and should not be used as the sole basis for treatment or other patient management decisions. A negative result may occur with  improper specimen collection/handling, submission of specimen other than nasopharyngeal swab, presence of viral mutation(s) within the areas targeted by this assay, and  inadequate number of viral copies(<138 copies/mL). A negative result must be combined with clinical observations, patient history, and epidemiological information. The expected result is Negative.  Fact Sheet for Patients:  EntrepreneurPulse.com.au  Fact Sheet for Healthcare Providers:  IncredibleEmployment.be  This test is no t yet approved or cleared by the [place] FDA and  has been authorized for detection and/or diagnosis of SARS-CoV-2 by FDA under an Emergency Use Authorization (EUA). This EUA will remain  in effect (meaning this test can be used) for the duration of the COVID-19 declaration under Section 564(b)(1) of the Act, 21 U.S.C.section 360bbb-3(b)(1), unless the authorization is terminated  or revoked sooner.       Influenza A by PCR NEGATIVE NEGATIVE Final   Influenza B by PCR NEGATIVE NEGATIVE Final    Comment: (NOTE) The Xpert Xpress SARS-CoV-2/FLU/RSV plus assay is intended as an aid in the diagnosis of influenza from Nasopharyngeal swab specimens and should not be used as a sole basis for treatment. Nasal washings and aspirates are unacceptable for Xpert Xpress SARS-CoV-2/FLU/RSV testing.  Fact Sheet for Patients: EntrepreneurPulse.com.au  Fact Sheet for Healthcare Providers: IncredibleEmployment.be  This test is not yet approved or cleared by the [place] FDA and has been authorized for detection and/or diagnosis of SARS-CoV-2 by FDA under an Emergency Use Authorization (EUA). This EUA will remain in effect (meaning this test can be used) for the duration of the COVID-19 declaration under Section 564(b)(1) of the Act, 21 U.S.C. section 360bbb-3(b)(1), unless the authorization is terminated or revoked.  Performed at [facility], [address] [place], [place] [address]     Coagulation Studies: Recent Labs    05/01/21 1043  LABPROT 13.6  INR 1.0     Urinalysis: No results for input(s): COLORURINE, LABSPEC, PHURINE, GLUCOSEU, HGBUR, BILIRUBINUR, KETONESUR, PROTEINUR, UROBILINOGEN, NITRITE, LEUKOCYTESUR in the last 72 hours.  Invalid input(s): APPERANCEUR    Imaging: DG Chest 2 View  Result Date: 05/01/2021 CLINICAL DATA:  Shortness of breath, hypoxia, end-stage renal disease, dialysis patient EXAM: CHEST - 2 VIEW COMPARISON:  08/07/2018 FINDINGS: Heart is enlarged with diffuse increased interstitial opacities worse in the lower lobes and small bilateral pleural effusions. Pattern compatible with mild CHF. There is associated bibasilar opacities favored to be atelectasis. Difficult to exclude basilar pneumonia. No pneumothorax. Aorta atherosclerotic. Degenerative changes of the spine. Bones are osteopenic. Left axillary dialysis access stent noted. IMPRESSION: Cardiomegaly with diffuse mild interstitial edema pattern and small effusions compatible with CHF. Associated bibasilar atelectasis, less likely pneumonia. Aortic Atherosclerosis (ICD10-I70.0). Electronically Signed   By: Jerilynn Mages.  Shick M.D.   On: 05/01/2021 10:27     Medications:    sodium chloride     sodium chloride      Chlorhexidine Gluconate Cloth  6 each Topical Q0600   lidocaine-prilocaine       sevelamer carbonate  1,600 mg Oral TID WC   sodium chloride, sodium chloride, alteplase, heparin, lidocaine (PF), lidocaine-prilocaine, pentafluoroprop-tetrafluoroeth  Assessment/ Plan:  Carrie [name] is a 70 y.o.  female  with past medical history including anemia, end-stage renal disease on dialysis, depression, diabetes, and hypertension.  She reports to the emergency room with complaints of shortness of breath that began this morning when she awoke.    Shortness of breath due to pulmonary edema seen on chest x-ray.  Patient may have unknowingly liberalized her fluid restriction over the weekend.  We will treat with dialysis.  End-stage renal disease on dialysis.  Will  provide dialysis today for fluid removal.  Goal UF 2 L as tolerated.  3. Anemia of chronic kidney disease Lab Results  Component Value Date   HGB 9.8 (L) 05/01/2021  Hemoglobin within acceptable range  4. Secondary Hyperparathyroidism:  Lab Results  Component Value Date   CALCIUM 8.4 (L) 05/01/2021   PHOS 4.6 08/06/2018  Will monitor bone minerals if patient is admitted Sevelamer prescribed with meals  5.  Hypertension with chronic kidney disease.  Home regimen includes amlodipine, losartan, metoprolol, and torsemide.  Blood pressure stable for this patient    LOS: 0 Lendell Gallick 11/28/20225:45 PM

## 2021-05-01 NOTE — H&P (Signed)
History and Physical    Carrie [name] WLN:[id number] DOB: 1950-07-03 DOA: 05/01/2021  PCP: Venia Carbon, MD    Patient coming from:  Home   Chief Complaint:  Shortness of breath   HPI:  Carrie [name] is a 70 y.o. female seen in ed with complaints of shortness of breath since waking up today, EMS was called with her O2 sats at 85% on room air.  Patient is a hemodialysis patient and had her last session on Friday she is a Monday Wednesday Friday schedule, initially patient satting 99% on 6 L nasal cannula.  Patient presents to [place] today with shortness of breath after having her dialysis earlier today.  After initial evaluation patient was taken to dialysis for for her continued worsening and hypoxia and respiratory failure goal of 2 L ultrafiltration per nephrology note. On my evaluation in the emergency room patient states that she came in for shortness of breath and her oxygen was going down, she has been short of breath for about a day when she woke up she got up out of bed and went to the kitchen went to the bathroom and felt very short of breath that is when she called her daughter who then called EMS. Patient otherwise denies any headaches blurred vision speech gait palpitations chest tightness chest pain, fevers chills abdominal pain bowel movement changes bleeding urinary or bladder issues. Diet history reviewed with patient she is eating a regular diet and recently has had high sodium diet because of the holidays.  Discussed with patient's about sodium counting and restricting salt along with phosphorus and other minerals due to her renal disease and dialysis.  Pt has past medical history of allergies to naproxen and Bactrim, colon polyps, eustachian tube dysfunction, end-stage renal disease, chronic diastolic CHF, osteoarthritis, obesity, diabetes mellitus type 2, hypertension, history of meningioma.  ED Course:  Vitals:   05/01/21 1636 05/01/21 1657 05/01/21 1818 05/01/21  1934  BP: (!) 175/85  (!) 161/81   Pulse: 94  93   Resp:   18   Temp: 98.4 F (36.9 C)  98.7 F (37.1 C)   TempSrc: Oral  Oral   SpO2: 99%  (!) 88% 100%  Weight:  103.5 kg    The emergency room patient has afebrile oxygenating 99% on 4 L nasal cannula, blood pressure is stable.  Initial EKG sinus rhythm 87 with normal intervals and axis, no ST-T wave changes.  Initial chest x-ray also notes diffuse mild interstitial edema pattern and small effusion compatible with CHF. CMP shows a creatinine of 10.22 BUN of 49 glucose 109 otherwise normal, BNP of 463.6, troponin of 59, CBC shows normal white count hemoglobin of 9.8 and platelets of 194.  Respiratory panel negative for flu and COVID.   Review of Systems:  Review of Systems  Respiratory:  Positive for shortness of breath.        Pt reports DOE AND SOB.   All other systems reviewed and are negative.   Past Medical History:  Diagnosis Date   Allergy    Anemia    Arthritis    "right knee" (10/26'/2017)   CKD (chronic kidney disease), stage III ([place])    12/2015 (Dr. Lavonia Dana)   Depression    History of blood transfusion 2008   "when I had brain tumor surgery"   History of MRSA infection 2008   Hypertension    Meningioma ([place])    Foramen magnum   Type II diabetes mellitus ([place])  2011   on metformin until yr ago- no med now - off due to kidneys    Past Surgical History:  Procedure Laterality Date   A/V FISTULAGRAM Left 03/10/2019   Procedure: A/V FISTULAGRAM;  Surgeon: Katha Cabal, MD;  Location: [facility];  Service: Cardiovascular;  Laterality: Left;   A/V FISTULAGRAM Left 07/14/2019   Procedure: A/V FISTULAGRAM;  Surgeon: Katha Cabal, MD;  Location: [facility];  Service: Cardiovascular;  Laterality: Left;   A/V FISTULAGRAM Left 01/07/2020   Procedure: A/V FISTULAGRAM;  Surgeon: Katha Cabal, MD;  Location: [facility];  Service: Cardiovascular;  Laterality: Left;   AV FISTULA  PLACEMENT Left 11/07/2018   Procedure: ARTERIOVENOUS (AV) FISTULA CREATION ( BRACHIO BASILIC ) VS BRACHIAL AXILLARY GRAFT;  Surgeon: Katha Cabal, MD;  Location: [facility];  Service: Vascular;  Laterality: Left;   BRAIN MENINGIOMA EXCISION  08/2006   Foramina magnum meningioma   CERVICAL DISCECTOMY  1996   Dr. Alric Seton   CESAREAN SECTION  1981   COLONOSCOPY     COLONOSCOPY WITH PROPOFOL N/A 03/07/2021   Procedure: COLONOSCOPY WITH PROPOFOL;  Surgeon: Virgel Manifold, MD;  Location: [facility];  Service: Endoscopy;  Laterality: N/A;   DIALYSIS/PERMA CATHETER INSERTION N/A 08/04/2018   Procedure: DIALYSIS/PERMA CATHETER INSERTION;  Surgeon: Algernon Huxley, MD;  Location: [facility];  Service: Cardiovascular;  Laterality: N/A;   DIALYSIS/PERMA CATHETER REMOVAL N/A 12/25/2018   Procedure: DIALYSIS/PERMA CATHETER REMOVAL;  Surgeon: Algernon Huxley, MD;  Location: [facility];  Service: Cardiovascular;  Laterality: N/A;   DILATION AND CURETTAGE OF UTERUS     JOINT REPLACEMENT     right knee left hip   Right wrist x-ray  2007   Deg. at 1st MCP   TOTAL HIP ARTHROPLASTY Left 03/27/2016   Procedure: LEFT TOTAL HIP ARTHROPLASTY ANTERIOR APPROACH;  Surgeon: Mcarthur Rossetti, MD;  Location: [facility];  Service: Orthopedics;  Laterality: Left;   TOTAL KNEE ARTHROPLASTY Right 04/30/2017   Procedure: RIGHT TOTAL KNEE ARTHROPLASTY;  Surgeon: Mcarthur Rossetti, MD;  Location: [facility];  Service: Orthopedics;  Laterality: Right;   TUBAL LIGATION  1981     reports that she has never smoked. She has never used smokeless tobacco. She reports that she does not drink alcohol and does not use drugs.  Allergies  Allergen Reactions   Naproxen Sodium Anaphylaxis and Swelling   Sulfamethoxazole-Trimethoprim Anaphylaxis and Swelling    Family History  Problem Relation Age of Onset   Hypertension Other        Strong family history    Breast cancer Mother 15   Heart failure Sister     Heart disease Sister     Prior to Admission medications   Medication Sig Start Date End Date Taking? Authorizing Provider  acetaminophen (TYLENOL) 500 MG tablet Take 1,000 mg by mouth every 6 (six) hours as needed (for pain.).     [provider]  albuterol (VENTOLIN HFA) 108 (90 Base) MCG/ACT inhaler Inhale 2 puffs into the lungs as needed. 11/14/20   [provider]  amLODipine (NORVASC) 5 MG tablet Take 5 mg by mouth daily. 01/27/21   [provider]  aspirin EC 81 MG tablet Take 81 mg by mouth daily.    [provider]  atorvastatin (LIPITOR) 40 MG tablet TAKE 40 MG BY MOUTH ONCE DAILY Patient taking differently: Take 40 mg by mouth daily. 03/11/18   Viviana Simpler  I, MD  levETIRAcetam (KEPPRA) 1000 MG tablet Take 1 tablet (1,000 mg total) by mouth 2 (two) times daily. 02/17/21   Venia Carbon, MD  lidocaine-prilocaine (EMLA) cream Apply 1 application topically every Monday, Wednesday, and Friday with hemodialysis.    [provider]  LORazepam (ATIVAN) 0.5 MG tablet Take 1 tablet (0.5 mg total) by mouth 2 (two) times daily as needed for anxiety. 03/29/21   Venia Carbon, MD  losartan (COZAAR) 25 MG tablet Take 25 mg by mouth every evening. Take 2 if over 140 or over 90.    [provider]  metoprolol tartrate (LOPRESSOR) 25 MG tablet Take 25 mg by mouth every evening.  01/06/19   [provider]  multivitamin (RENA-VIT) TABS tablet Take 1 tablet by mouth at bedtime. 08/07/18   Loletha Grayer, MD  omeprazole (PRILOSEC) 20 MG capsule Take 1 capsule (20 mg total) by mouth daily. 02/07/21   Venia Carbon, MD  sevelamer carbonate (RENVELA) 800 MG tablet Take 800-1,600 mg by mouth See admin instructions. Take 2 tablets (1600 mg) by mouth with each meal & 1 tablet (800 mg) by mouth with each snack 10/06/18   [provider]  torsemide (DEMADEX) 20 MG tablet Take 20 mg by mouth 4 (four) times a week. Take 1 tablet (20 mg)  by mouth in the morning on Tuesdays, Thursdays, Saturdays, & Sundays in the morning on non-dialysis days. 01/11/19   [provider]    Physical Exam: Vitals:   05/01/21 1636 05/01/21 1657 05/01/21 1818 05/01/21 1934  BP: (!) 175/85  (!) 161/81   Pulse: 94  93   Resp:   18   Temp: 98.4 F (36.9 C)  98.7 F (37.1 C)   TempSrc: Oral  Oral   SpO2: 99%  (!) 88% 100%  Weight:  103.5 kg     Physical Exam Vitals and nursing note reviewed.  Constitutional:      General: She is not in acute distress.    Appearance: She is not ill-appearing, toxic-appearing or diaphoretic.  HENT:     Head: Normocephalic and atraumatic.     Right Ear: External ear normal.     Left Ear: External ear normal.     Nose: Nose normal.     Mouth/Throat:     Mouth: Mucous membranes are moist.  Eyes:     Extraocular Movements: Extraocular movements intact.     Pupils: Pupils are equal, round, and reactive to light.  Cardiovascular:     Rate and Rhythm: Normal rate and regular rhythm.     Pulses: Normal pulses.     Heart sounds: Normal heart sounds.  Pulmonary:     Effort: Pulmonary effort is normal.     Breath sounds: Rales present.  Abdominal:     General: Bowel sounds are normal.     Palpations: Abdomen is soft.  Musculoskeletal:     Right lower leg: Edema present.     Left lower leg: Edema present.  Skin:    General: Skin is warm.  Neurological:     General: No focal deficit present.     Mental Status: She is alert and oriented to person, place, and time.  Psychiatric:        Mood and Affect: Mood normal.        Behavior: Behavior normal.     Labs on Admission: I have personally reviewed following labs and imaging studies  No results for input(s): CKTOTAL, CKMB, TROPONINI in the  last 72 hours. Lab Results  Component Value Date   WBC 6.8 05/01/2021   HGB 9.8 (L) 05/01/2021   HCT 31.2 (L) 05/01/2021   MCV 97.5 05/01/2021   PLT 194 05/01/2021    Recent Labs  Lab 05/01/21 1043   NA 139  K 4.3  CL 102  CO2 28  BUN 49*  CREATININE 10.22*  CALCIUM 8.4*  PROT 7.9  BILITOT 0.7  ALKPHOS 102  ALT 14  AST 20  GLUCOSE 109*   Lab Results  Component Value Date   CHOL 178 12/20/2017   HDL 48.70 12/20/2017   LDLCALC 113 (H) 12/20/2017   TRIG 85.0 12/20/2017   No results found for: DDIMER Invalid input(s): POCBNP   COVID-19 Labs No results for input(s): DDIMER, FERRITIN, LDH, CRP in the last 72 hours. Lab Results  Component Value Date   SARSCOV2NAA NEGATIVE 05/01/2021   SARSCOV2NAA Detected (A) 09/26/2020   [place] NEGATIVE 01/05/2020   [place] NEGATIVE 07/10/2019    Radiological Exams on Admission: DG Chest 2 View  Result Date: 05/01/2021 CLINICAL DATA:  Hypoxia EXAM: CHEST - 2 VIEW COMPARISON:  05/01/2021, 08/08/2018 FINDINGS: Cardiomegaly with vascular congestion, mild interstitial pulmonary edema and small bilateral pleural effusions. Globular cardiac configuration. Aortic atherosclerosis. No pneumothorax. Stent in the left upper extremity. IMPRESSION: 1. Cardiomegaly with vascular congestion, mild interstitial edema and small pleural effusions. Globular cardiac configuration either due to multi chamber enlargement and/or pericardial effusion 2. Airspace opacity at the bases, atelectasis versus pneumonia Electronically Signed   By: Donavan Foil M.D.   On: 05/01/2021 18:46   DG Chest 2 View  Result Date: 05/01/2021 CLINICAL DATA:  Shortness of breath, hypoxia, end-stage renal disease, dialysis patient EXAM: CHEST - 2 VIEW COMPARISON:  08/07/2018 FINDINGS: Heart is enlarged with diffuse increased interstitial opacities worse in the lower lobes and small bilateral pleural effusions. Pattern compatible with mild CHF. There is associated bibasilar opacities favored to be atelectasis. Difficult to exclude basilar pneumonia. No pneumothorax. Aorta atherosclerotic. Degenerative changes of the spine. Bones are osteopenic. Left axillary dialysis access  stent noted. IMPRESSION: Cardiomegaly with diffuse mild interstitial edema pattern and small effusions compatible with CHF. Associated bibasilar atelectasis, less likely pneumonia. Aortic Atherosclerosis (ICD10-I70.0). Electronically Signed   By: Jerilynn Mages.  Shick M.D.   On: 05/01/2021 10:27    EKG: Independently reviewed.   Initial EKG sinus rhythm 87 with normal intervals and axis, no ST-T wave changes.   Echocardiogram 10/02/2017: Impressions:  - Normal LV size with mild LV hypertrophy. EF 60-65%. Normal RV    size and systolic function. Mild aortic stenosis.   Assessment/Plan: Principal Problem:   SOB (shortness of breath) Active Problems:   Acute respiratory failure with hypoxia (HCC)   ESRD on hemodialysis (HCC)   Chronic diastolic CHF (congestive heart failure) (HCC)   Hypertension   Type 2 diabetes, controlled, with renal manifestation (HCC)   Anemia   Shortness of breath/acute respiratory failure with hypoxia: Differentials include volume overload and pulmonary vascular congestion secondary to renal disease versus cardiac disease versus combination of both versus VTE. Discussed case with nephrology and we will pursue VQ scan and lower extremity Dopplers and a 2D echocardiogram for evaluation of VTE.  Patient still produces urine and nephrotoxic agents need to be avoided to preserve remnant kidney function. Supportive care with supplemental oxygen.  End-stage renal disease on hemodialysis: Appreciate nephrology consult management and care. We will continue patient on losartan, metoprolol, Demadex, sevelamer.  Chronic diastolic congestive heart failure: Will obtain  2D echocardiogram, continue patient's Demadex metoprolol and losartan. Daily weights, strict I's and O's.  Hypertension: Blood pressure (!) 161/81, pulse 93, temperature 98.7 F (37.1 C), temperature source Oral, resp. rate 18, weight 103.5 kg, SpO2 100 %. We will continue patient on metoprolol and amlodipine and  Demadex. As needed hydralazine as needed for systolic blood pressure 031 and above.  Diabetes mellitus type 2: Sliding scale insulin.  Anemia: Attribute to anemia of chronic kidney disease: Continue with IV PPI therapy. Type and screen.   DVT prophylaxis:  Heparin  Code Status:  Full code  Family Communication:  Marcelline Deist (Sister)  [phone] (Mobile)   Disposition Plan:  Home  Consults called:  Nephrology  Admission status: Inpatient   Para Skeans MD [facility] [phone] How to contact the [facility] Attending or Consulting provider [place] or covering provider during after hours [place], for this patient.    Check the care team in [facility] and look for a) attending/consulting [facility] provider listed and b) the [facility] team listed Log into [email or web address] and use [place]'s universal password to access. If you do not have the password, please contact the hospital operator. Locate the [facility] provider you are looking for under [facility] and page to a number that you can be directly reached. If you still have difficulty reaching the provider, please page the [facility] (Director on Call) for the Hospitalists listed on amion for assistance. [email or web address] Password [email or web address] 05/01/2021, 8:25 PM

## 2021-05-01 NOTE — ED Notes (Signed)
Unsuccessful blood draw/IV stick. IV team consulted

## 2021-05-02 ENCOUNTER — Inpatient Hospital Stay: Payer: Medicare HMO

## 2021-05-02 ENCOUNTER — Other Ambulatory Visit: Payer: Self-pay

## 2021-05-02 ENCOUNTER — Inpatient Hospital Stay (HOSPITAL_COMMUNITY)
Admit: 2021-05-02 | Discharge: 2021-05-02 | Disposition: A | Discharge status: Home / Self Care | Payer: Medicare HMO | Attending: Internal Medicine | Admitting: Internal Medicine

## 2021-05-02 DIAGNOSIS — G40909 Epilepsy, unspecified, not intractable, without status epilepticus: Secondary | ICD-10-CM

## 2021-05-02 DIAGNOSIS — J9601 Acute respiratory failure with hypoxia: Secondary | ICD-10-CM | POA: Diagnosis not present

## 2021-05-02 DIAGNOSIS — N186 End stage renal disease: Secondary | ICD-10-CM | POA: Diagnosis not present

## 2021-05-02 DIAGNOSIS — I5033 Acute on chronic diastolic (congestive) heart failure: Secondary | ICD-10-CM | POA: Diagnosis not present

## 2021-05-02 DIAGNOSIS — I5021 Acute systolic (congestive) heart failure: Secondary | ICD-10-CM | POA: Diagnosis not present

## 2021-05-02 DIAGNOSIS — I10 Essential (primary) hypertension: Secondary | ICD-10-CM

## 2021-05-02 DIAGNOSIS — E785 Hyperlipidemia, unspecified: Secondary | ICD-10-CM | POA: Insufficient documentation

## 2021-05-02 LAB — ECHOCARDIOGRAM COMPLETE
AR max vel: 1.36 cm2
AV Area VTI: 1.28 cm2
AV Area mean vel: 1.43 cm2
AV Mean grad: 6 mmHg
AV Peak grad: 12.5 mmHg
Ao pk vel: 1.77 m/s
Area-P 1/2: 4.17 cm2
MV VTI: 1.93 cm2
S' Lateral: 3.57 cm
Weight: 3650.82 oz

## 2021-05-02 LAB — CBC
HCT: 30.9 % — ABNORMAL LOW (ref 36.0–46.0)
Hemoglobin: 9.7 g/dL — ABNORMAL LOW (ref 12.0–15.0)
MCH: 30.4 pg (ref 26.0–34.0)
MCHC: 31.4 g/dL (ref 30.0–36.0)
MCV: 96.9 fL (ref 80.0–100.0)
Platelets: 170 10*3/uL (ref 150–400)
RBC: 3.19 MIL/uL — ABNORMAL LOW (ref 3.87–5.11)
RDW: 14.9 % (ref 11.5–15.5)
WBC: 5.5 10*3/uL (ref 4.0–10.5)
nRBC: 0 % (ref 0.0–0.2)

## 2021-05-02 LAB — COMPREHENSIVE METABOLIC PANEL
ALT: 12 U/L (ref 0–44)
AST: 14 U/L — ABNORMAL LOW (ref 15–41)
Albumin: 3.6 g/dL (ref 3.5–5.0)
Alkaline Phosphatase: 95 U/L (ref 38–126)
Anion gap: 6 (ref 5–15)
BUN: 28 mg/dL — ABNORMAL HIGH (ref 8–23)
CO2: 32 mmol/L (ref 22–32)
Calcium: 8.2 mg/dL — ABNORMAL LOW (ref 8.9–10.3)
Chloride: 96 mmol/L — ABNORMAL LOW (ref 98–111)
Creatinine, Ser: 6.61 mg/dL — ABNORMAL HIGH (ref 0.44–1.00)
GFR, Estimated: 6 mL/min — ABNORMAL LOW (ref >60)
Glucose, Bld: 98 mg/dL (ref 70–99)
Potassium: 3.5 mmol/L (ref 3.5–5.1)
Sodium: 134 mmol/L — ABNORMAL LOW (ref 135–145)
Total Bilirubin: 0.7 mg/dL (ref 0.3–1.2)
Total Protein: 7.3 g/dL (ref 6.5–8.1)

## 2021-05-02 LAB — TROPONIN I (HIGH SENSITIVITY): Troponin I (High Sensitivity): 73 ng/L — ABNORMAL HIGH (ref <18)

## 2021-05-02 LAB — HEPATITIS B SURFACE ANTIBODY, QUANTITATIVE: Hep B S AB Quant (Post): 39.2 m[IU]/mL (ref >9.9)

## 2021-05-02 MED ORDER — TECHNETIUM TO 99M ALBUMIN AGGREGATED
4.0000 | Freq: Once | INTRAVENOUS | Status: AC | PRN
  Administered 2021-05-02: 4.48 via INTRAVENOUS
  Filled 2021-05-02: qty 4

## 2021-05-02 NOTE — ED Notes (Signed)
Pt resting comfortably with family at bedside

## 2021-05-02 NOTE — Progress Notes (Signed)
PT Cancellation Note  Patient Details Name: Carrie [name] MRN: [id number] DOB: July 11, 1950   Cancelled Treatment:    Reason Eval/Treat Not Completed: Other (comment).  Upon therapist arrival to ED, pt's room empty and nurse reports pt very recently was transported to medical floor.  Therapist went to pt's new room but pt not available for PT session d/t busy with nursing.  Will re-attempt PT session/eval at a later date/time as able.  Leitha Bleak, PT 05/02/21, 4:12 PM

## 2021-05-02 NOTE — Progress Notes (Signed)
*  PRELIMINARY RESULTS* Echocardiogram 2D Echocardiogram has been performed.  Carrie [name] 05/02/2021, 9:53 AM

## 2021-05-02 NOTE — Progress Notes (Signed)
OT Cancellation Note  Patient Details Name: Carrie [name] MRN: [id number] DOB: 21-Mar-1951   Cancelled Treatment:    Reason Eval/Treat Not Completed: Medical issues which prohibited therapy;Other (comment) (Per chart review and discussion with PT, pt is pending BLE [place] and [place] Pulmonary perfusion. Per therapy guidelines, will hold until results of tests to ensure pt is medically approrpiate for evaluation. Will re-attempt as able.[name], OTD OTR/L  05/02/21, 10:03 AM

## 2021-05-02 NOTE — Progress Notes (Signed)
Patient ID: Carrie [name], female   DOB: 1950-10-01, 70 y.o.   MRN: [id number] [facility] PROGRESS NOTE  Carrie [name] [id number]:[id number] DOB: 12/24/1950 DOA: 05/01/2021 PCP: Carrie Carbon, MD  HPI/Subjective: Patient brought in by family.  She was having trouble breathing.  If it was up to her she would have stayed at home.  Patient admitted with acute hypoxic respiratory failure and shortness of breath.  Patient had dialysis yesterday.  Objective: Vitals:   05/02/21 1425 05/02/21 1426  BP:    Pulse: 78 80  Resp: 17 19  Temp:    SpO2: (!) 86% 94%    Intake/Output Summary (Last 24 hours) at 05/02/2021 1458 Last data filed at 05/01/2021 1636 Gross per 24 hour  Intake --  Output 1348 ml  Net -1348 ml   Filed Weights   05/01/21 0936 05/01/21 1306 05/01/21 1657  Weight: 106.4 kg 104.5 kg 103.5 kg    ROS: Review of Systems  Respiratory:  Positive for cough and shortness of breath.   Cardiovascular:  Negative for chest pain.  Gastrointestinal:  Negative for abdominal pain, nausea and vomiting.  Exam: Physical Exam HENT:     Head: Normocephalic.     Mouth/Throat:     Pharynx: No oropharyngeal exudate.  Eyes:     General: Lids are normal.     Conjunctiva/sclera: Conjunctivae normal.  Cardiovascular:     Rate and Rhythm: Normal rate and regular rhythm.     Heart sounds: Normal heart sounds, S1 normal and S2 normal.  Pulmonary:     Breath sounds: Examination of the right-lower field reveals decreased breath sounds and rales. Examination of the left-lower field reveals decreased breath sounds and rales. Decreased breath sounds and rales present. No wheezing or rhonchi.  Abdominal:     Palpations: Abdomen is soft.     Tenderness: There is no abdominal tenderness.  Musculoskeletal:     Right lower leg: Swelling present.     Left lower leg: Swelling present.  Neurological:     Mental Status: She is alert and oriented to person, place, and time.      Scheduled  Meds:  amLODipine  10 mg Oral Daily   aspirin EC  81 mg Oral Daily   atorvastatin  40 mg Oral Daily   Chlorhexidine Gluconate Cloth  6 each Topical Q0600   heparin  5,000 Units Subcutaneous Q12H   levETIRAcetam  1,000 mg Oral BID   losartan  25 mg Oral Once per day on Sun Tue Thu Sat   And   [START ON 05/03/2021] losartan  50 mg Oral Once per day on Mon Wed Fri   metoprolol succinate  25 mg Oral QPM   multivitamin  1 tablet Oral QHS   pantoprazole  40 mg Oral Daily   sevelamer carbonate  1,600 mg Oral TID WC   sodium chloride flush  3 mL Intravenous Q12H   torsemide  20 mg Oral Once per day on Sun Tue Thu Sat   Continuous Infusions:  sodium chloride     sodium chloride     Brief history: 70 year old female with past medical history of end-stage renal disease on dialysis Monday, Wednesday Friday, seizure disorder/meningioma, hypertension, hyperlipidemia.  Patient presenting with shortness of breath.  Patient had dialysis on admission with chest x-ray showing pulmonary edema.  Assessment/Plan:  Acute hypoxic respiratory failure.  Initial pulse ox was 85% by EMS.  Patient was initially placed on 6 L of oxygen.  She was down  on 2 L of oxygen.  She desaturated quite a bit down to 56% on room air with ambulating and required to go back on oxygen.  VQ scan negative, ultrasound lower extremities negative for DVT.  Incentive spirometer for atelectasis at the lung bases.  Holding off on antibiotics at this point. Acute on chronic diastolic congestive heart failure.  EF greater than 55%.  Dialysis yesterday remove fluid.  Continue torsemide on nondialysis days. End-stage renal disease on dialysis Monday Wednesday Friday.  Had dialysis yesterday here in the hospital Seizure disorder/meningioma on Keppra Essential hypertension.  Norvasc and metoprolol Hyperlipidemia unspecified on atorvastatin Morbid obesity with a BMI of 41.07    Code Status:     Code Status Orders  (From admission, onward)            Start     Ordered   05/01/21 1949  Full code  Continuous        05/01/21 1949           Code Status History     Date Active Date Inactive Code Status Order ID Comments User Context   08/04/2018 1545 08/07/2018 1915 Full Code [id number]  Carrie Costa, MD Inpatient   04/30/2017 1807 05/03/2017 1603 Full Code [id number]  Carrie Rossetti, MD Inpatient   03/27/2016 1628 03/30/2016 1914 Full Code [id number]  Carrie Rossetti, MD Inpatient      Family Communication: Spoke with family at the bedside Disposition Plan: Status is: Inpatient  Consultants: Nephrology  Time spent: 28 minutes  [place]

## 2021-05-02 NOTE — Progress Notes (Signed)
PT Cancellation Note  Patient Details Name: Carrie [name] MRN: [id number] DOB: [birth year]   Cancelled Treatment:    Reason Eval/Treat Not Completed: Other (comment).  PT consult received.  Chart reviewed.  Pt currently pending B LE [place] and [place] Pulmonary Perfusion (eval for VTE).  Per therapy guidelines, will hold therapy at this time until results of these tests are back and pt is medically appropriate for therapy evaluation.  Leitha Bleak, PT 05/02/21, 8:37 AM

## 2021-05-03 DIAGNOSIS — Z992 Dependence on renal dialysis: Secondary | ICD-10-CM | POA: Diagnosis not present

## 2021-05-03 DIAGNOSIS — N186 End stage renal disease: Secondary | ICD-10-CM | POA: Diagnosis not present

## 2021-05-03 DIAGNOSIS — I132 Hypertensive heart and chronic kidney disease with heart failure and with stage 5 chronic kidney disease, or end stage renal disease: Secondary | ICD-10-CM | POA: Diagnosis not present

## 2021-05-03 DIAGNOSIS — I5032 Chronic diastolic (congestive) heart failure: Secondary | ICD-10-CM

## 2021-05-03 DIAGNOSIS — R0602 Shortness of breath: Secondary | ICD-10-CM | POA: Diagnosis not present

## 2021-05-03 MED ORDER — ACETAMINOPHEN 500 MG PO TABS
1000.0000 mg | ORAL_TABLET | Freq: Four times a day (QID) | ORAL | Status: DC | PRN
  Administered 2021-05-03 – 2021-05-05 (×4): 1000 mg via ORAL
  Filled 2021-05-03 (×4): qty 2

## 2021-05-03 MED ORDER — PENTAFLUOROPROP-TETRAFLUOROETH EX AERO
INHALATION_SPRAY | CUTANEOUS | Status: AC
  Administered 2021-05-03: 1 via TOPICAL
  Filled 2021-05-03: qty 30

## 2021-05-03 MED ORDER — PROSOURCE PLUS PO LIQD
30.0000 mL | Freq: Two times a day (BID) | ORAL | Status: DC
  Administered 2021-05-03 – 2021-05-05 (×4): 30 mL via ORAL
  Filled 2021-05-03 (×5): qty 30

## 2021-05-03 NOTE — Plan of Care (Signed)
?  Problem: Clinical Measurements: ?Goal: Ability to maintain clinical measurements within normal limits will improve ?Outcome: Progressing ?Goal: Will remain free from infection ?Outcome: Progressing ?Goal: Diagnostic test results will improve ?Outcome: Progressing ?  ?

## 2021-05-03 NOTE — Progress Notes (Signed)
OT Cancellation Note  Patient Details Name: Carrie [name] MRN: [id number] DOB: 1950-10-09   Cancelled Treatment:    Reason Eval/Treat Not Completed: Patient at procedure or test/ unavailable. Pt noted to be off the floor for HD, unavailable at this time. Will continue to follow and initiate services at later date/time as pt available.   Dessie Coma, M.S. OTR/L  05/03/21, 11:01 AM  ascom [phone]

## 2021-05-03 NOTE — Progress Notes (Signed)
Initial Nutrition Assessment  DOCUMENTATION CODES:   Morbid obesity  INTERVENTION:   -Renal MVI daily -30 ml Prosource Plus BID, each supplement provides 100 kcals and 15 grams protein -Provided "Healthy Food Pyramid for Patients with Kidney Disease"; attached to AVS/ discharge summary  NUTRITION DIAGNOSIS:   Increased nutrient needs related to chronic illness (ESRD on HD) as evidenced by estimated needs.  GOAL:   Patient will meet greater than or equal to 90% of their needs  MONITOR:   PO intake, Supplement acceptance, Labs, Weight trends, I & O's, Skin  REASON FOR ASSESSMENT:   Consult Assessment of nutrition requirement/status, Diet education  ASSESSMENT:   Carrie [name] is a 70 y.o. female seen in ed with complaints of shortness of breath since waking up today, EMS was called with her O2 sats at 85% on room air.  Patient is a hemodialysis patient and had her last session on Friday she is a Monday Wednesday Friday schedule, initially patient satting 99% on 6 L nasal cannula.  Patient presents to [place] today with shortness of breath after having her dialysis earlier today.  After initial evaluation patient was taken to dialysis for for her continued worsening and hypoxia and respiratory failure goal of 2 L ultrafiltration per nephrology note.  Pt admitted with SOB/ acute respiratory failure with hypoxia.   Reviewed I/O's: +240 ml x 24 hours and -1.3 L since admission  Pt unavailable at time of visit. Pt out or room at time of visits. RD unable to obtain further nutrition-related history or complete nutrition-focused physical exam at this time.     Pt with no meal completions documented. She is currently on a renal diet with 1.2 L fluid restriction.   Reviewed wt hx; pt has experienced a 5% wt loss over the past 3 months, which is not significant for time frame.   Obesity is a complex, chronic medical condition that is optimally managed by a multidisciplinary care team.  Weight loss is not an ideal goal for an acute inpatient hospitalization. However, if further work-up for obesity is warranted, consider outpatient referral to outpatient bariatric service and/or 's Nutrition and Diabetes Education Services.    Medications reviewed and include keppra and renvela.   Labs reviewed: Na: 134.    Diet Order:   Diet Order             Diet renal with fluid restriction Fluid restriction: 1200 mL Fluid; Room service appropriate? Yes; Fluid consistency: Thin  Diet effective now                   EDUCATION NEEDS:   No education needs have been identified at this time  Skin:  Skin Assessment: Reviewed RN Assessment  Last BM:  05/02/21  Height:   Ht Readings from Last 1 Encounters:  05/02/21 5\' 2"  (1.575 m)    Weight:   Wt Readings from Last 1 Encounters:  05/03/21 105.5 kg    Ideal Body Weight:  50 kg  BMI:  Body mass index is 42.54 kg/m.  Estimated Nutritional Needs:   Kcal:  1800-2000  Protein:  100-115 grams  Fluid:  1000 ml + UOP    Loistine Chance, RD, LDN, [place] Registered Dietitian II Certified Diabetes Care and Education Specialist Please refer to [facility] for RD and/or RD on-call/weekend/after hours pager

## 2021-05-03 NOTE — Evaluation (Signed)
Physical Therapy Evaluation Patient Details Name: Carrie [name] MRN: [id number] DOB: 04/16/1951 Today's Date: 05/03/2021  History of Present Illness  Patient is a 70 year old female who reports to [facility] with complaints of SOB. Patient is a hemodialysis patient and had her last session on Friday she is a Monday Wednesday Friday schedule. Patient has a past medical history of allergies to naproxen and Bactrim, colon polyps, eustachian tube dysfunction, end-stage renal disease, chronic diastolic CHF, osteoarthritis, obesity, diabetes mellitus type 2, hypertension, history of meningioma.  Clinical Impression  Patient tolerated session well and was agreeable to treatment. Upon entering room patient was laying in bed with HOB elevated and sister present. Patient reported she was ready to get up and moving as she had to go to the bathroom. No pain was reported throughout the session. At start of session, when patient was supine, vitals were taken with O2 reporting at 77%. RN was notified and patient was placed on 4L O2 via [place]. Upon sitting EOB O2 was taken again an came back 96%. Following ambulating/ conclusion of session however, O2 reported at 82% at 4L O2 via [place] and RN was notified again. Patient was able to ambulate around room and in hallways at supervision with RW, however did report increased difficulty with breathing and was cued to walk back to room. Patient would benefit from HHPT upon D/C. Patient would benefit from skilled physical therapy in order to work on cardiovascular endurance, balance, and BLE strength in order to help patient report to PLOF.      Recommendations for follow up therapy are one component of a multi-disciplinary discharge planning process, led by the attending physician.  Recommendations may be updated based on patient status, additional functional criteria and insurance authorization.  Follow Up Recommendations Home health PT    Assistance Recommended at Discharge PRN   Functional Status Assessment Patient has had a recent decline in their functional status and demonstrates the ability to make significant improvements in function in a reasonable and predictable amount of time.  Equipment Recommendations  None recommended by PT    Recommendations for Other Services       Precautions / Restrictions Precautions Precautions: Fall Restrictions Weight Bearing Restrictions: No      Mobility  Bed Mobility Overal bed mobility: Modified Independent (increased time and effort)             General bed mobility comments: Bed mobility completed with HOB elevated    Transfers Overall transfer level: Needs assistance Equipment used: Rolling walker (2 wheels) Transfers: Bed to chair/wheelchair/BSC;Sit to/from Stand Sit to Stand: Supervision (verbal cueing on pushing up from the bed vs pulling up from walker) Stand pivot transfers: Modified independent (Device/Increase time) (increased time)              Ambulation/Gait Ambulation/Gait assistance: Supervision Gait Distance (Feet): 80 Feet (at around 40 feet patient complained of increased trouble breathing and was cued to turn back around. Once patient was seated EOB O2 was taken again and came back as 82 on 4L O2 via [place]. RN was informed.) Assistive device: Rolling walker (2 wheels) Gait Pattern/deviations: Step-through pattern;Decreased step length - right;Decreased step length - left;Decreased stride length;Narrow base of support Gait velocity: decreased candence     General Gait Details: on 4L O2  Stairs            Wheelchair Mobility    Modified Rankin (Stroke Patients Only)       Balance Overall balance assessment: Needs  assistance Sitting-balance support: Bilateral upper extremity supported;Feet supported Sitting balance-Leahy Scale: Good Sitting balance - Comments: able to reach outside BOS w/ feet firmly supported   Standing balance support: Bilateral upper extremity  supported;Single extremity supported Standing balance-Leahy Scale: Fair Standing balance comment: Patient able to reach outside BOS when washing hands and reaching for paper-towel holder; was able to complete with supervision                             Pertinent Vitals/Pain Pain Assessment: No/denies pain    Home Living Family/patient expects to be discharged to:: Private residence Living Arrangements: Other relatives (Sisters (2), daughter, neice, nephew, granddaughter) Available Help at Discharge: Family;Available 24 hours/day Type of Home: House Home Access: Stairs to enter Entrance Stairs-Rails: None Entrance Stairs-Number of Steps: 2   Home Layout: Two level;Able to live on main level with bedroom/bathroom   Additional Comments: Patient reports having a walker (2 wheels), cane, and BSC    Prior Function Prior Level of Function : Independent/Modified Independent                     Hand Dominance   Dominant Hand: Right    Extremity/Trunk Assessment   Upper Extremity Assessment Upper Extremity Assessment: Overall WFL for tasks assessed    Lower Extremity Assessment Lower Extremity Assessment: Generalized weakness;LLE deficits/detail (globally 3+ to 4/5 strenght in BLEs) LLE Deficits / Details: Limited knee flexion on L due to patient report of having "bone on bone"       Communication   Communication: No difficulties  Cognition Arousal/Alertness: Awake/alert Behavior During Therapy: WFL for tasks assessed/performed Overall Cognitive Status: Within Functional Limits for tasks assessed                                          General Comments      Exercises Other Exercises Other Exercises: sit<>stand x3 with supervision and RW from EOB Other Exercises: stand pivot transfer from bed>BSC completed Mod I Other Exercises: sit<>stand w/ supervision and RW fom BSC   Assessment/Plan    PT Assessment Patient needs continued PT  services  PT Problem List Decreased strength;Decreased range of motion;Decreased mobility;Decreased activity tolerance;Decreased balance       PT Treatment Interventions Therapeutic exercise;Gait training;Balance training;Stair training;Neuromuscular re-education;Functional mobility training;Therapeutic activities;Patient/family education;DME instruction    PT Goals (Current goals can be found in the Care Plan section)  Acute Rehab PT Goals Patient Stated Goal: To go home PT Goal Formulation: With patient/family Time For Goal Achievement: 05/17/21 Potential to Achieve Goals: Good    Frequency Min 2X/week   Barriers to discharge        Co-evaluation               AM-PAC PT "6 Clicks" Mobility  Outcome Measure Help needed turning from your back to your side while in a flat bed without using bedrails?: None Help needed moving from lying on your back to sitting on the side of a flat bed without using bedrails?: A Little Help needed moving to and from a bed to a chair (including a wheelchair)?: A Little Help needed standing up from a chair using your arms (e.g., wheelchair or bedside chair)?: A Little Help needed to walk in hospital room?: A Little Help needed climbing 3-5 steps with a railing? : A Little  6 Click Score: 19    End of Session Equipment Utilized During Treatment: Gait belt Activity Tolerance: Patient tolerated treatment well;Patient limited by fatigue Patient left: in bed;Other (comment) (w/ cell phone and with Dialysis transporter) Nurse Communication: Mobility status;Other (comment) (O2 status throughout session) PT Visit Diagnosis: Difficulty in walking, not elsewhere classified (R26.2);Muscle weakness (generalized) (M62.81);Unsteadiness on feet (R26.81)    Time: 1992-4155 PT Time Calculation (min) (ACUTE ONLY): 30 min   Charges:   PT Evaluation $PT Eval Low Complexity: 1 Low PT Treatments $Gait Training: 8-22 mins        Iva Boop, PT   05/03/21. 9:44 AM

## 2021-05-03 NOTE — Progress Notes (Signed)
PROGRESS NOTE    BOLUWATIFE MUTCHLER   FKC:[id number]  DOB: [birth year]  PCP: Venia Carbon, MD    DOA: 05/01/2021 LOS: 2    Brief Narrative / Hospital Course to Date:   70 year old female with past medical history of end-stage renal disease on dialysis Monday, Wednesday Friday, seizure disorder/meningioma, hypertension, hyperlipidemia.  Patient presenting with shortness of breath.  Patient underwent dialysis on admission with chest x-ray showing pulmonary edema.  11/30: continues to require O2 with significant desaturations on exertion.     Assessment & Plan   Principal Problem:   SOB (shortness of breath) Active Problems:   Essential hypertension   Type 2 diabetes, controlled, with renal manifestation (HCC)   Obesity, Class III, BMI 40-49.9 (morbid obesity) (HCC)   Acute on chronic diastolic CHF (congestive heart failure) (HCC)   ESRD on hemodialysis (HCC)   Anemia   Acute respiratory failure with hypoxia (HCC)   Acute respiratory failure with hypoxia -initial SPO2 per EMS was 85% on room air.  Patient initially required 6 L/min supplemental O2 on admission.  Weaned to 2 L/min at rest, briefly on room air this morning (11/30). VQ scan negative for evidence of PE.  Lower extremity Doppler ultrasounds negative bilaterally for DVT. --Supplemental O2 to maintain sats above 90% --Incentive spirometry --Monitor for signs of respiratory infection (so far afebrile, no leukocytosis, intermittent dry cough which is patient's baseline) --Volume removal by dialysis and diuretic as below  Acute on chronic diastolic CHF -patient had signs of pulmonary edema on admission x-ray, presented with shortness of breath. EF greater than 74%, grade 1 diastolic dysfunction, moderate LVH. --Volume removal by dialysis --Continue home torsemide on nondialysis days --Consider IV diuresis tomorrow if still requiring O2 --Strict I/O's and daily weights to assess volume status  ESRD -on  hemodialysis MWF.  Had dialysis today 11/30. --Nephrology following --Ultrafiltration as tolerated for volume removal  Seizure disorder, meningioma -continue home Keppra  Essential hypertension -continue Norvasc metoprolol  Hyperlipidemia -continue atorvastatin  Morbid obesity: Body mass index is 41.55 kg/m.  Complicates overall care and prognosis.  Recommend lifestyle modifications including physical activity and diet for weight loss and overall long-term health.   DVT prophylaxis: heparin injection 5,000 Units Start: 05/01/21 2200   Diet:  Diet Orders (From admission, onward)     Start     Ordered   05/02/21 1050  Diet renal with fluid restriction Fluid restriction: 1200 mL Fluid; Room service appropriate? Yes; Fluid consistency: Thin  Diet effective now       Question Answer Comment  Fluid restriction: 1200 mL Fluid   Room service appropriate? Yes   Fluid consistency: Thin      05/02/21 1049              Code Status: Full Code   Subjective 05/03/21    Patient seen awake sitting up in bed after having dialysis today.  She reports they did remove more fluid which she tolerated better today.  Yesterday they pulled off a couple liters that she had significant cramping, was able to tolerate.  She denies fevers or chills, does endorse feeling more fatigued than usual recently.  Does not require any home oxygen at baseline.  She reports a dry hacking cough intermittently   Disposition Plan & Communication   Status is: Inpatient  Remains inpatient appropriate because: Continues to have oxygen desaturations requiring supplementation.    Family Communication: Daughter at bedside on rounds today, another daughter and other family member on  speaker phone during encounter   Consults, Procedures, Significant Events   Consultants:  Nephrology  Procedures:  Dialysis  Antimicrobials:  Anti-infectives (From admission, onward)    None         Micro    Objective    Vitals:   05/03/21 1315 05/03/21 1348 05/03/21 1510 05/03/21 1516  BP: 108/61 119/70  (!) 104/56  Pulse: 71 75  79  Resp: 14 20  (!) 22  Temp:      TempSrc:      SpO2: 99% 100% (!) 72% 99%  Weight: 103 kg     Height:        Intake/Output Summary (Last 24 hours) at 05/03/2021 1747 Last data filed at 05/03/2021 1504 Gross per 24 hour  Intake 480 ml  Output 1494 ml  Net -1014 ml   Filed Weights   05/03/21 0455 05/03/21 1000 05/03/21 1315  Weight: 105.1 kg 105.5 kg 103 kg    Physical Exam:  General exam: awake, alert, no acute distress, obese HEENT: atraumatic, clear conjunctiva, anicteric sclera, moist mucus membranes, hearing grossly normal  Respiratory system: CTAB with diminished bases, no wheezes, rales or rhonchi, normal respiratory effort on 2 L/min nasal cannula oxygen. Cardiovascular system: normal S1/S2, RRR, trace lower extremity edema bilaterally .   Gastrointestinal system: soft, nontender abdomen. Central nervous system: A&O x4. no gross focal neurologic deficits, normal speech Extremities: moves all, no edema, normal tone Psychiatry: normal mood, congruent affect, judgement and insight appear normal  Labs   Data Reviewed: I have personally reviewed following labs and imaging studies  CBC: Recent Labs  Lab 05/01/21 1043 05/02/21 0524  WBC 6.8 5.5  NEUTROABS 5.0  --   HGB 9.8* 9.7*  HCT 31.2* 30.9*  MCV 97.5 96.9  PLT 194 213   Basic Metabolic Panel: Recent Labs  Lab 05/01/21 1043 05/02/21 0524  NA 139 134*  K 4.3 3.5  CL 102 96*  CO2 28 32  GLUCOSE 109* 98  BUN 49* 28*  CREATININE 10.22* 6.61*  CALCIUM 8.4* 8.2*   GFR: Estimated Creatinine Clearance: 8.7 mL/min (A) (by C-G formula based on SCr of 6.61 mg/dL (H)). Liver Function Tests: Recent Labs  Lab 05/01/21 1043 05/02/21 0524  AST 20 14*  ALT 14 12  ALKPHOS 102 95  BILITOT 0.7 0.7  PROT 7.9 7.3  ALBUMIN 4.0 3.6   No results for input(s): LIPASE, AMYLASE in the last 168  hours. No results for input(s): AMMONIA in the last 168 hours. Coagulation Profile: Recent Labs  Lab 05/01/21 1043  INR 1.0   Cardiac Enzymes: No results for input(s): CKTOTAL, CKMB, CKMBINDEX, TROPONINI in the last 168 hours. BNP (last 3 results) No results for input(s): PROBNP in the last 8760 hours. HbA1C: No results for input(s): HGBA1C in the last 72 hours. CBG: No results for input(s): GLUCAP in the last 168 hours. Lipid Profile: No results for input(s): CHOL, HDL, LDLCALC, TRIG, CHOLHDL, LDLDIRECT in the last 72 hours. Thyroid Function Tests: No results for input(s): TSH, T4TOTAL, FREET4, T3FREE, THYROIDAB in the last 72 hours. Anemia Panel: No results for input(s): VITAMINB12, FOLATE, FERRITIN, TIBC, IRON, RETICCTPCT in the last 72 hours. Sepsis Labs: No results for input(s): PROCALCITON, LATICACIDVEN in the last 168 hours.  Recent Results (from the past 240 hour(s))  Resp Panel by RT-PCR (Flu A&B, Covid) Nasopharyngeal Swab     Status: None   Collection Time: 05/01/21  9:42 AM   Specimen: Nasopharyngeal Swab; Nasopharyngeal(NP) swabs in vial transport  medium  Result Value Ref Range Status   SARS Coronavirus 2 by RT PCR NEGATIVE NEGATIVE Final    Comment: (NOTE) SARS-CoV-2 target nucleic acids are NOT DETECTED.  The SARS-CoV-2 RNA is generally detectable in upper respiratory specimens during the acute phase of infection. The lowest concentration of SARS-CoV-2 viral copies this assay can detect is 138 copies/mL. A negative result does not preclude SARS-Cov-2 infection and should not be used as the sole basis for treatment or other patient management decisions. A negative result may occur with  improper specimen collection/handling, submission of specimen other than nasopharyngeal swab, presence of viral mutation(s) within the areas targeted by this assay, and inadequate number of viral copies(<138 copies/mL). A negative result must be combined with clinical  observations, patient history, and epidemiological information. The expected result is Negative.  Fact Sheet for Patients:  EntrepreneurPulse.com.au  Fact Sheet for Healthcare Providers:  IncredibleEmployment.be  This test is no t yet approved or cleared by the [place] FDA and  has been authorized for detection and/or diagnosis of SARS-CoV-2 by FDA under an Emergency Use Authorization (EUA). This EUA will remain  in effect (meaning this test can be used) for the duration of the COVID-19 declaration under Section 564(b)(1) of the Act, 21 U.S.C.section 360bbb-3(b)(1), unless the authorization is terminated  or revoked sooner.       Influenza A by PCR NEGATIVE NEGATIVE Final   Influenza B by PCR NEGATIVE NEGATIVE Final    Comment: (NOTE) The Xpert Xpress SARS-CoV-2/FLU/RSV plus assay is intended as an aid in the diagnosis of influenza from Nasopharyngeal swab specimens and should not be used as a sole basis for treatment. Nasal washings and aspirates are unacceptable for Xpert Xpress SARS-CoV-2/FLU/RSV testing.  Fact Sheet for Patients: EntrepreneurPulse.com.au  Fact Sheet for Healthcare Providers: IncredibleEmployment.be  This test is not yet approved or cleared by the [place] FDA and has been authorized for detection and/or diagnosis of SARS-CoV-2 by FDA under an Emergency Use Authorization (EUA). This EUA will remain in effect (meaning this test can be used) for the duration of the COVID-19 declaration under Section 564(b)(1) of the Act, 21 U.S.C. section 360bbb-3(b)(1), unless the authorization is terminated or revoked.  Performed at [facility], [address] [place], [place] [address]       Imaging Studies   DG Chest 2 View  Result Date: 05/01/2021 CLINICAL DATA:  Hypoxia EXAM: CHEST - 2 VIEW COMPARISON:  05/01/2021, 08/08/2018 FINDINGS: Cardiomegaly with vascular  congestion, mild interstitial pulmonary edema and small bilateral pleural effusions. Globular cardiac configuration. Aortic atherosclerosis. No pneumothorax. Stent in the left upper extremity. IMPRESSION: 1. Cardiomegaly with vascular congestion, mild interstitial edema and small pleural effusions. Globular cardiac configuration either due to multi chamber enlargement and/or pericardial effusion 2. Airspace opacity at the bases, atelectasis versus pneumonia Electronically Signed   By: Donavan Foil M.D.   On: 05/01/2021 18:46   NM Pulmonary Perfusion  Result Date: 05/02/2021 CLINICAL DATA:  Respiratory failure shortness of breath EXAM: NUCLEAR MEDICINE PERFUSION LUNG SCAN TECHNIQUE: Perfusion images were obtained in multiple projections after intravenous injection of radiopharmaceutical. Ventilation scans intentionally deferred if perfusion scan and chest x-ray adequate for interpretation during COVID 19 epidemic. RADIOPHARMACEUTICALS:  4.48 mCi Tc-24m MAA IV COMPARISON:  Chest radiograph, 05/01/2021 FINDINGS: Normal, homogeneous perfusion of the lungs. No suspicious perfusion defect. Cardiomegaly. IMPRESSION: 1. Very low probability for pulmonary embolism by modified perfusion only PIOPED criteria (PE absent). 2.  Cardiomegaly. Electronically Signed   By: Lanae Crumbly  Laqueta Carina M.D.   On: 05/02/2021 11:17   US Venous Img Lower Bilateral (DVT)  Result Date: 05/02/2021 CLINICAL DATA:  Lower extremity edema for 1 month EXAM: BILATERAL LOWER EXTREMITY VENOUS DOPPLER ULTRASOUND TECHNIQUE: Gray-scale sonography with compression, as well as color and duplex ultrasound, were performed to evaluate the deep venous system(s) from the level of the common femoral vein through the popliteal and proximal calf veins. COMPARISON:  None. FINDINGS: VENOUS Normal compressibility of the common femoral, superficial femoral, and popliteal veins, as well as the visualized calf veins. Visualized portions of profunda femoral vein and great  saphenous vein unremarkable. No filling defects to suggest DVT on grayscale or color Doppler imaging. Doppler waveforms show normal direction of venous flow, normal respiratory plasticity and response to augmentation. Limited views of the contralateral common femoral vein are unremarkable. OTHER None. Limitations: none IMPRESSION: Negative examination for deep venous thrombosis in the bilateral lower extremities. Electronically Signed   By: Delanna Ahmadi M.D.   On: 05/02/2021 12:20   ECHOCARDIOGRAM COMPLETE  Result Date: 05/02/2021    ECHOCARDIOGRAM REPORT   Patient Name:   MARWAH DISBRO Date of Exam: 05/02/2021 Medical Rec #:  [id number]        Height:       62.5 in Accession #:    [id number]       Weight:       228.2 lb Date of Birth:  1951-03-20        BSA:          2.034 m Patient Age:    68 years         BP:           135/81 mmHg Patient Gender: F                HR:           75 bpm. Exam Location:  [facility] Procedure: 2D Echo, Color Doppler and Cardiac Doppler Indications:     I50.21 congestive heart failure-Acute Systolic  History:         Patient has prior history of Echocardiogram examinations. CKD                  stage III; Risk Factors:Hypertension and Diabetes.  Sonographer:     Charmayne Sheer Referring Phys:  [id number] Gretta Cool PATEL Diagnosing Phys: Nelva Bush MD  Sonographer Comments: No subcostal window. IMPRESSIONS  1. Left ventricular ejection fraction, by estimation, is >55%. The left ventricle has normal function. Left ventricular endocardial border not optimally defined to evaluate regional wall motion. There is moderate left ventricular hypertrophy. Left ventricular diastolic parameters are consistent with Grade I diastolic dysfunction (impaired relaxation).  2. Right ventricular systolic function is normal. The right ventricular size is normal.  3. The mitral valve is degenerative. Trivial mitral valve regurgitation. No evidence of mitral stenosis.  4. The aortic valve was not well visualized.  Aortic valve regurgitation is not visualized. No aortic stenosis is present.  5. The inferior vena cava is normal in size with greater than 50% respiratory variability, suggesting right atrial pressure of 3 mmHg. FINDINGS  Left Ventricle: Left ventricular ejection fraction, by estimation, is >55%. The left ventricle has normal function. Left ventricular endocardial border not optimally defined to evaluate regional wall motion. The left ventricular internal cavity size was  normal in size. There is moderate left ventricular hypertrophy. Left ventricular diastolic parameters are consistent with Grade I diastolic dysfunction (impaired relaxation). Right Ventricle: The right ventricular size  is normal. No increase in right ventricular wall thickness. Right ventricular systolic function is normal. Left Atrium: Left atrial size was normal in size. Right Atrium: Right atrial size was normal in size. Pericardium: There is no evidence of pericardial effusion. Mitral Valve: The mitral valve is degenerative in appearance. There is mild thickening of the mitral valve leaflet(s). There is moderate calcification of the mitral valve leaflet(s). Trivial mitral valve regurgitation. No evidence of mitral valve stenosis. MV peak gradient, 3.0 mmHg. The mean mitral valve gradient is 2.0 mmHg. Tricuspid Valve: The tricuspid valve is not well visualized. Tricuspid valve regurgitation is trivial. Aortic Valve: The aortic valve was not well visualized. Aortic valve regurgitation is not visualized. No aortic stenosis is present. Aortic valve mean gradient measures 6.0 mmHg. Aortic valve peak gradient measures 12.5 mmHg. Aortic valve area, by VTI measures 1.28 cm. Pulmonic Valve: The pulmonic valve was not well visualized. Pulmonic valve regurgitation is trivial. No evidence of pulmonic stenosis. Aorta: The aortic root is normal in size and structure. Pulmonary Artery: The pulmonary artery is of normal size. Venous: The inferior vena cava  is normal in size with greater than 50% respiratory variability, suggesting right atrial pressure of 3 mmHg. IAS/Shunts: The interatrial septum was not well visualized.  LEFT VENTRICLE PLAX 2D LVIDd:         4.57 cm   Diastology LVIDs:         3.57 cm   LV e' medial:    5.77 cm/s LV PW:         1.65 cm   LV E/e' medial:  12.4 LV IVS:        1.39 cm   LV e' lateral:   5.11 cm/s LVOT diam:     1.90 cm   LV E/e' lateral: 14.0 LV SV:         44 LV SV Index:   21 LVOT Area:     2.84 cm  RIGHT VENTRICLE RV Basal diam:  3.56 cm LEFT ATRIUM             Index        RIGHT ATRIUM           Index LA diam:        2.90 cm 1.43 cm/m   RA Area:     13.20 cm LA Vol (A2C):   49.1 ml 24.14 ml/m  RA Volume:   28.80 ml  14.16 ml/m LA Vol (A4C):   51.5 ml 25.32 ml/m LA Biplane Vol: 50.4 ml 24.78 ml/m  AORTIC VALVE                     PULMONIC VALVE AV Area (Vmax):    1.36 cm      PV Vmax:       1.22 m/s AV Area (Vmean):   1.43 cm      PV Vmean:      78.600 cm/s AV Area (VTI):     1.28 cm      PV VTI:        0.232 m AV Vmax:           177.00 cm/s   PV Peak grad:  6.0 mmHg AV Vmean:          117.000 cm/s  PV Mean grad:  3.0 mmHg AV VTI:            0.341 m AV Peak Grad:      12.5 mmHg AV Mean Grad:  6.0 mmHg LVOT Vmax:         84.90 cm/s LVOT Vmean:        59.200 cm/s LVOT VTI:          0.154 m LVOT/AV VTI ratio: 0.45  AORTA Ao Root diam: 2.90 cm MITRAL VALVE MV Area (PHT): 4.17 cm    SHUNTS MV Area VTI:   1.93 cm    Systemic VTI:  0.15 m MV Peak grad:  3.0 mmHg    Systemic Diam: 1.90 cm MV Mean grad:  2.0 mmHg MV Vmax:       0.87 m/s MV Vmean:      62.5 cm/s MV Decel Time: 182 msec MV E velocity: 71.60 cm/s MV A velocity: 75.80 cm/s MV E/A ratio:  0.94 Christopher End MD Electronically signed by Nelva Bush MD Signature Date/Time: 05/02/2021/12:29:31 PM    Final      Medications   Scheduled Meds:  (feeding supplement) PROSource Plus  30 mL Oral BID BM   amLODipine  10 mg Oral Daily   aspirin EC  81 mg Oral  Daily   atorvastatin  40 mg Oral Daily   Chlorhexidine Gluconate Cloth  6 each Topical Q0600   heparin  5,000 Units Subcutaneous Q12H   levETIRAcetam  1,000 mg Oral BID   losartan  25 mg Oral Once per day on Sun Tue Thu Sat   And   losartan  50 mg Oral Once per day on Mon Wed Fri   metoprolol succinate  25 mg Oral QPM   multivitamin  1 tablet Oral QHS   pantoprazole  40 mg Oral Daily   sevelamer carbonate  1,600 mg Oral TID WC   sodium chloride flush  3 mL Intravenous Q12H   torsemide  20 mg Oral Once per day on Sun Tue Thu Sat   Continuous Infusions:     LOS: 2 days    Time spent: 30 minutes    Ezekiel Slocumb, DO [facility]  05/03/2021, 5:47 PM      If 7PM-7AM, please contact night-coverage. How to contact the [facility] Attending or Consulting provider Branch or covering provider during after hours [place], for this patient?    Check the care team in [facility] and look for a) attending/consulting [facility] provider listed and b) the [facility] team listed Log into [email or web address] and use [place]'s universal password to access. If you do not have the password, please contact the hospital operator. Locate the [facility] provider you are looking for under [facility] and page to a number that you can be directly reached. If you still have difficulty reaching the provider, please page the [facility] (Director on Call) for the Hospitalists listed on amion for assistance.

## 2021-05-03 NOTE — Progress Notes (Signed)
[facility]  ROUNDING NOTE   Subjective:   Carrie [name] is a 70 year old female with past medical history including anemia, end-stage renal disease on dialysis, depression, diabetes, and hypertension.  She reports to the emergency room with complaints of shortness of breath that began this morning when she awoke.  Patient was scheduled for dialysis today at outpatient clinic, but states her shortness of breath was too bad she called EMS.  Patient is known to our office and receives outpatient dialysis treatments at [facility] on a MWF schedule, supervised by Dr. Juleen China.    Patient seen and evaluated during dialysis.   HEMODIALYSIS FLOWSHEET:  Blood Flow Rate (mL/min): 400 mL/min Arterial Pressure (mmHg): -200 mmHg Venous Pressure (mmHg): 150 mmHg Transmembrane Pressure (mmHg): 60 mmHg Ultrafiltration Rate (mL/min): 670 mL/min Dialysate Flow Rate (mL/min): 500 ml/min Conductivity: Machine : 13.6 Conductivity: Machine : 13.6 Dialysis Fluid Bolus: Normal [place]ine Bolus Amount (mL): 250 mL  Continues to complain of intermittent shortness of breath.  Currently on 4 L oxygen.  No other complaints at this time  Tolerating dialysis well   Objective:  Vital signs in last 24 hours:  Temp:  [98 F (36.7 C)-98.7 F (37.1 C)] 98.4 F (36.9 C) (11/30 1300) Pulse Rate:  [65-90] 71 (11/30 1315) Resp:  [12-34] 14 (11/30 1315) BP: (101-141)/(61-75) 108/61 (11/30 1315) SpO2:  [56 %-100 %] 99 % (11/30 1315) Weight:  [103 kg-105.5 kg] 103 kg (11/30 1315)  Weight change: -2.4 kg Filed Weights   05/03/21 0455 05/03/21 1000 05/03/21 1315  Weight: 105.1 kg 105.5 kg 103 kg    Intake/Output: No intake/output data recorded.   Intake/Output this shift:  Total I/O In: 240 [P.O.:240] Out: 1494 [Other:1494]  Physical Exam: General: NAD, resting comfortably  Head: Normocephalic, atraumatic. Moist oral mucosal membranes  Eyes: Anicteric  Lungs:  Clear to auscultation,  normal effort, O2 4L [place]  Heart: Regular rate and rhythm  Abdomen:  Soft, nontender, nondistended  Extremities: Trace peripheral edema.  Neurologic: Nonfocal, moving all four extremities  Skin: No lesions  Access: Left upper extremity AVG    Basic Metabolic Panel: Recent Labs  Lab 05/01/21 1043 05/02/21 0524  NA 139 134*  K 4.3 3.5  CL 102 96*  CO2 28 32  GLUCOSE 109* 98  BUN 49* 28*  CREATININE 10.22* 6.61*  CALCIUM 8.4* 8.2*     Liver Function Tests: Recent Labs  Lab 05/01/21 1043 05/02/21 0524  AST 20 14*  ALT 14 12  ALKPHOS 102 95  BILITOT 0.7 0.7  PROT 7.9 7.3  ALBUMIN 4.0 3.6    No results for input(s): LIPASE, AMYLASE in the last 168 hours. No results for input(s): AMMONIA in the last 168 hours.  CBC: Recent Labs  Lab 05/01/21 1043 05/02/21 0524  WBC 6.8 5.5  NEUTROABS 5.0  --   HGB 9.8* 9.7*  HCT 31.2* 30.9*  MCV 97.5 96.9  PLT 194 170     Cardiac Enzymes: No results for input(s): CKTOTAL, CKMB, CKMBINDEX, TROPONINI in the last 168 hours.  BNP: Invalid input(s): POCBNP  CBG: No results for input(s): GLUCAP in the last 168 hours.  Microbiology: Results for orders placed or performed during the hospital encounter of 05/01/21  Resp Panel by RT-PCR (Flu A&B, Covid) Nasopharyngeal Swab     Status: None   Collection Time: 05/01/21  9:42 AM   Specimen: Nasopharyngeal Swab; Nasopharyngeal(NP) swabs in vial transport medium  Result Value Ref Range Status   SARS Coronavirus  2 by RT PCR NEGATIVE NEGATIVE Final    Comment: (NOTE) SARS-CoV-2 target nucleic acids are NOT DETECTED.  The SARS-CoV-2 RNA is generally detectable in upper respiratory specimens during the acute phase of infection. The lowest concentration of SARS-CoV-2 viral copies this assay can detect is 138 copies/mL. A negative result does not preclude SARS-Cov-2 infection and should not be used as the sole basis for treatment or other patient management decisions. A negative  result may occur with  improper specimen collection/handling, submission of specimen other than nasopharyngeal swab, presence of viral mutation(s) within the areas targeted by this assay, and inadequate number of viral copies(<138 copies/mL). A negative result must be combined with clinical observations, patient history, and epidemiological information. The expected result is Negative.  Fact Sheet for Patients:  EntrepreneurPulse.com.au  Fact Sheet for Healthcare Providers:  IncredibleEmployment.be  This test is no t yet approved or cleared by the [place] FDA and  has been authorized for detection and/or diagnosis of SARS-CoV-2 by FDA under an Emergency Use Authorization (EUA). This EUA will remain  in effect (meaning this test can be used) for the duration of the COVID-19 declaration under Section 564(b)(1) of the Act, 21 U.S.C.section 360bbb-3(b)(1), unless the authorization is terminated  or revoked sooner.       Influenza A by PCR NEGATIVE NEGATIVE Final   Influenza B by PCR NEGATIVE NEGATIVE Final    Comment: (NOTE) The Xpert Xpress SARS-CoV-2/FLU/RSV plus assay is intended as an aid in the diagnosis of influenza from Nasopharyngeal swab specimens and should not be used as a sole basis for treatment. Nasal washings and aspirates are unacceptable for Xpert Xpress SARS-CoV-2/FLU/RSV testing.  Fact Sheet for Patients: EntrepreneurPulse.com.au  Fact Sheet for Healthcare Providers: IncredibleEmployment.be  This test is not yet approved or cleared by the [place] FDA and has been authorized for detection and/or diagnosis of SARS-CoV-2 by FDA under an Emergency Use Authorization (EUA). This EUA will remain in effect (meaning this test can be used) for the duration of the COVID-19 declaration under Section 564(b)(1) of the Act, 21 U.S.C. section 360bbb-3(b)(1), unless the authorization is  terminated or revoked.  Performed at [facility], [address] [place], [place] [address]     Coagulation Studies: Recent Labs    05/01/21 1043  LABPROT 13.6  INR 1.0     Urinalysis: No results for input(s): COLORURINE, LABSPEC, PHURINE, GLUCOSEU, HGBUR, BILIRUBINUR, KETONESUR, PROTEINUR, UROBILINOGEN, NITRITE, LEUKOCYTESUR in the last 72 hours.  Invalid input(s): APPERANCEUR    Imaging: DG Chest 2 View  Result Date: 05/01/2021 CLINICAL DATA:  Hypoxia EXAM: CHEST - 2 VIEW COMPARISON:  05/01/2021, 08/08/2018 FINDINGS: Cardiomegaly with vascular congestion, mild interstitial pulmonary edema and small bilateral pleural effusions. Globular cardiac configuration. Aortic atherosclerosis. No pneumothorax. Stent in the left upper extremity. IMPRESSION: 1. Cardiomegaly with vascular congestion, mild interstitial edema and small pleural effusions. Globular cardiac configuration either due to multi chamber enlargement and/or pericardial effusion 2. Airspace opacity at the bases, atelectasis versus pneumonia Electronically Signed   By: Donavan Foil M.D.   On: 05/01/2021 18:46   NM Pulmonary Perfusion  Result Date: 05/02/2021 CLINICAL DATA:  Respiratory failure shortness of breath EXAM: NUCLEAR MEDICINE PERFUSION LUNG SCAN TECHNIQUE: Perfusion images were obtained in multiple projections after intravenous injection of radiopharmaceutical. Ventilation scans intentionally deferred if perfusion scan and chest x-ray adequate for interpretation during COVID 19 epidemic. RADIOPHARMACEUTICALS:  4.48 mCi Tc-11m MAA IV COMPARISON:  Chest radiograph, 05/01/2021 FINDINGS: Normal, homogeneous perfusion of the lungs.  No suspicious perfusion defect. Cardiomegaly. IMPRESSION: 1. Very low probability for pulmonary embolism by modified perfusion only PIOPED criteria (PE absent). 2.  Cardiomegaly. Electronically Signed   By: Delanna Ahmadi M.D.   On: 05/02/2021 11:17   US Venous Img Lower Bilateral  (DVT)  Result Date: 05/02/2021 CLINICAL DATA:  Lower extremity edema for 1 month EXAM: BILATERAL LOWER EXTREMITY VENOUS DOPPLER ULTRASOUND TECHNIQUE: Gray-scale sonography with compression, as well as color and duplex ultrasound, were performed to evaluate the deep venous system(s) from the level of the common femoral vein through the popliteal and proximal calf veins. COMPARISON:  None. FINDINGS: VENOUS Normal compressibility of the common femoral, superficial femoral, and popliteal veins, as well as the visualized calf veins. Visualized portions of profunda femoral vein and great saphenous vein unremarkable. No filling defects to suggest DVT on grayscale or color Doppler imaging. Doppler waveforms show normal direction of venous flow, normal respiratory plasticity and response to augmentation. Limited views of the contralateral common femoral vein are unremarkable. OTHER None. Limitations: none IMPRESSION: Negative examination for deep venous thrombosis in the bilateral lower extremities. Electronically Signed   By: Delanna Ahmadi M.D.   On: 05/02/2021 12:20   ECHOCARDIOGRAM COMPLETE  Result Date: 05/02/2021    ECHOCARDIOGRAM REPORT   Patient Name:   APOLONIA ELLWOOD Date of Exam: 05/02/2021 Medical Rec #:  [id number]        Height:       62.5 in Accession #:    [id number]       Weight:       228.2 lb Date of Birth:  December 25, 1950        BSA:          2.034 m Patient Age:    37 years         BP:           135/81 mmHg Patient Gender: F                HR:           75 bpm. Exam Location:  [facility] Procedure: 2D Echo, Color Doppler and Cardiac Doppler Indications:     I50.21 congestive heart failure-Acute Systolic  History:         Patient has prior history of Echocardiogram examinations. CKD                  stage III; Risk Factors:Hypertension and Diabetes.  Sonographer:     Charmayne Sheer Referring Phys:  [id number] Gretta Cool PATEL Diagnosing Phys: Nelva Bush MD  Sonographer Comments: No subcostal window. IMPRESSIONS  1.  Left ventricular ejection fraction, by estimation, is >55%. The left ventricle has normal function. Left ventricular endocardial border not optimally defined to evaluate regional wall motion. There is moderate left ventricular hypertrophy. Left ventricular diastolic parameters are consistent with Grade I diastolic dysfunction (impaired relaxation).  2. Right ventricular systolic function is normal. The right ventricular size is normal.  3. The mitral valve is degenerative. Trivial mitral valve regurgitation. No evidence of mitral stenosis.  4. The aortic valve was not well visualized. Aortic valve regurgitation is not visualized. No aortic stenosis is present.  5. The inferior vena cava is normal in size with greater than 50% respiratory variability, suggesting right atrial pressure of 3 mmHg. FINDINGS  Left Ventricle: Left ventricular ejection fraction, by estimation, is >55%. The left ventricle has normal function. Left ventricular endocardial border not optimally defined to evaluate regional wall motion. The left ventricular internal cavity  size was  normal in size. There is moderate left ventricular hypertrophy. Left ventricular diastolic parameters are consistent with Grade I diastolic dysfunction (impaired relaxation). Right Ventricle: The right ventricular size is normal. No increase in right ventricular wall thickness. Right ventricular systolic function is normal. Left Atrium: Left atrial size was normal in size. Right Atrium: Right atrial size was normal in size. Pericardium: There is no evidence of pericardial effusion. Mitral Valve: The mitral valve is degenerative in appearance. There is mild thickening of the mitral valve leaflet(s). There is moderate calcification of the mitral valve leaflet(s). Trivial mitral valve regurgitation. No evidence of mitral valve stenosis. MV peak gradient, 3.0 mmHg. The mean mitral valve gradient is 2.0 mmHg. Tricuspid Valve: The tricuspid valve is not well visualized.  Tricuspid valve regurgitation is trivial. Aortic Valve: The aortic valve was not well visualized. Aortic valve regurgitation is not visualized. No aortic stenosis is present. Aortic valve mean gradient measures 6.0 mmHg. Aortic valve peak gradient measures 12.5 mmHg. Aortic valve area, by VTI measures 1.28 cm. Pulmonic Valve: The pulmonic valve was not well visualized. Pulmonic valve regurgitation is trivial. No evidence of pulmonic stenosis. Aorta: The aortic root is normal in size and structure. Pulmonary Artery: The pulmonary artery is of normal size. Venous: The inferior vena cava is normal in size with greater than 50% respiratory variability, suggesting right atrial pressure of 3 mmHg. IAS/Shunts: The interatrial septum was not well visualized.  LEFT VENTRICLE PLAX 2D LVIDd:         4.57 cm   Diastology LVIDs:         3.57 cm   LV e' medial:    5.77 cm/s LV PW:         1.65 cm   LV E/e' medial:  12.4 LV IVS:        1.39 cm   LV e' lateral:   5.11 cm/s LVOT diam:     1.90 cm   LV E/e' lateral: 14.0 LV SV:         44 LV SV Index:   21 LVOT Area:     2.84 cm  RIGHT VENTRICLE RV Basal diam:  3.56 cm LEFT ATRIUM             Index        RIGHT ATRIUM           Index LA diam:        2.90 cm 1.43 cm/m   RA Area:     13.20 cm LA Vol (A2C):   49.1 ml 24.14 ml/m  RA Volume:   28.80 ml  14.16 ml/m LA Vol (A4C):   51.5 ml 25.32 ml/m LA Biplane Vol: 50.4 ml 24.78 ml/m  AORTIC VALVE                     PULMONIC VALVE AV Area (Vmax):    1.36 cm      PV Vmax:       1.22 m/s AV Area (Vmean):   1.43 cm      PV Vmean:      78.600 cm/s AV Area (VTI):     1.28 cm      PV VTI:        0.232 m AV Vmax:           177.00 cm/s   PV Peak grad:  6.0 mmHg AV Vmean:          117.000 cm/s  PV Mean grad:  3.0 mmHg AV VTI:            0.341 m AV Peak Grad:      12.5 mmHg AV Mean Grad:      6.0 mmHg LVOT Vmax:         84.90 cm/s LVOT Vmean:        59.200 cm/s LVOT VTI:          0.154 m LVOT/AV VTI ratio: 0.45  AORTA Ao Root diam: 2.90  cm MITRAL VALVE MV Area (PHT): 4.17 cm    SHUNTS MV Area VTI:   1.93 cm    Systemic VTI:  0.15 m MV Peak grad:  3.0 mmHg    Systemic Diam: 1.90 cm MV Mean grad:  2.0 mmHg MV Vmax:       0.87 m/s MV Vmean:      62.5 cm/s MV Decel Time: 182 msec MV E velocity: 71.60 cm/s MV A velocity: 75.80 cm/s MV E/A ratio:  0.94 Christopher End MD Electronically signed by Nelva Bush MD Signature Date/Time: 05/02/2021/12:29:31 PM    Final      Medications:    sodium chloride     sodium chloride      pentafluoroprop-tetrafluoroeth       (feeding supplement) PROSource Plus  30 mL Oral BID BM   amLODipine  10 mg Oral Daily   aspirin EC  81 mg Oral Daily   atorvastatin  40 mg Oral Daily   Chlorhexidine Gluconate Cloth  6 each Topical Q0600   heparin  5,000 Units Subcutaneous Q12H   levETIRAcetam  1,000 mg Oral BID   losartan  25 mg Oral Once per day on Sun Tue Thu Sat   And   losartan  50 mg Oral Once per day on Mon Wed Fri   metoprolol succinate  25 mg Oral QPM   multivitamin  1 tablet Oral QHS   pantoprazole  40 mg Oral Daily   sevelamer carbonate  1,600 mg Oral TID WC   sodium chloride flush  3 mL Intravenous Q12H   torsemide  20 mg Oral Once per day on Sun Tue Thu Sat   sodium chloride, sodium chloride, acetaminophen **OR** acetaminophen, acetaminophen, albuterol, alteplase, bisacodyl, heparin, hydrALAZINE, lidocaine (PF), lidocaine-prilocaine, LORazepam, morphine injection, ondansetron **OR** ondansetron (ZOFRAN) IV, pentafluoroprop-tetrafluoroeth, sevelamer carbonate  Assessment/ Plan:  Ms. ALETHEIA TANGREDI is a 70 y.o.  female with past medical history including anemia, end-stage renal disease on dialysis, depression, diabetes, and hypertension.  She reports to the emergency room with complaints of shortness of breath that began this morning when she awoke.    Shortness of breath due to pulmonary edema seen on chest x-ray.  Received dialysis previously with 2 L removed on Monday.  Shortness  of breath improved after treatment.  End-stage renal disease on dialysis.  Patient scheduled for dialysis today.  UF goal 1.5 achieved.  Next treatment scheduled for Friday, if remains inpatient  3. Anemia of chronic kidney disease Lab Results  Component Value Date   HGB 9.7 (L) 05/02/2021  Hemoglobin remains within goal.  We will continue to monitor for need for ESA's.   4. Secondary Hyperparathyroidism:  Lab Results  Component Value Date   CALCIUM 8.2 (L) 05/02/2021   PHOS 4.6 08/06/2018  Phosphorus and corrected calcium of 8.5 within goal.  Continue binders as prescribed  5.  Hypertension with chronic kidney disease.  Home regimen includes amlodipine, losartan, metoprolol, and torsemide.  BP 108/61 currently in dialysis  LOS: 2 [place] 11/30/20221:35 PM

## 2021-05-04 DIAGNOSIS — R0602 Shortness of breath: Secondary | ICD-10-CM | POA: Diagnosis not present

## 2021-05-04 LAB — CBC
HCT: 30.8 % — ABNORMAL LOW (ref 36.0–46.0)
Hemoglobin: 10 g/dL — ABNORMAL LOW (ref 12.0–15.0)
MCH: 30.8 pg (ref 26.0–34.0)
MCHC: 32.5 g/dL (ref 30.0–36.0)
MCV: 94.8 fL (ref 80.0–100.0)
Platelets: 176 10*3/uL (ref 150–400)
RBC: 3.25 MIL/uL — ABNORMAL LOW (ref 3.87–5.11)
RDW: 14.6 % (ref 11.5–15.5)
WBC: 4.2 10*3/uL (ref 4.0–10.5)
nRBC: 0 % (ref 0.0–0.2)

## 2021-05-04 LAB — BASIC METABOLIC PANEL
Anion gap: 9 (ref 5–15)
BUN: 36 mg/dL — ABNORMAL HIGH (ref 8–23)
CO2: 30 mmol/L (ref 22–32)
Calcium: 8.3 mg/dL — ABNORMAL LOW (ref 8.9–10.3)
Chloride: 95 mmol/L — ABNORMAL LOW (ref 98–111)
Creatinine, Ser: 6.9 mg/dL — ABNORMAL HIGH (ref 0.44–1.00)
GFR, Estimated: 6 mL/min — ABNORMAL LOW (ref >60)
Glucose, Bld: 101 mg/dL — ABNORMAL HIGH (ref 70–99)
Potassium: 3.9 mmol/L (ref 3.5–5.1)
Sodium: 134 mmol/L — ABNORMAL LOW (ref 135–145)

## 2021-05-04 MED ORDER — FUROSEMIDE 10 MG/ML IJ SOLN
60.0000 mg | Freq: Once | INTRAMUSCULAR | Status: AC
  Administered 2021-05-04: 60 mg via INTRAVENOUS
  Filled 2021-05-04: qty 6

## 2021-05-04 MED ORDER — GUAIFENESIN-DM 100-10 MG/5ML PO SYRP
5.0000 mL | ORAL_SOLUTION | ORAL | Status: DC | PRN
  Administered 2021-05-04 – 2021-05-05 (×4): 5 mL via ORAL
  Filled 2021-05-04 (×4): qty 5

## 2021-05-04 MED ORDER — FUROSEMIDE 10 MG/ML IJ SOLN
40.0000 mg | Freq: Once | INTRAMUSCULAR | Status: DC
Start: 2021-05-04 — End: 2021-05-04

## 2021-05-04 NOTE — Progress Notes (Addendum)
PROGRESS NOTE    Carrie [name]   KGU:[id number]  DOB: Apr 06, 1951  PCP: Venia Carbon, MD    DOA: 05/01/2021 LOS: 3    Brief Narrative / Hospital Course to Date:   70 year old female with past medical history of end-stage renal disease on dialysis Monday, Wednesday Friday, seizure disorder/meningioma, hypertension, hyperlipidemia.  Patient presenting with shortness of breath.  Patient underwent dialysis on admission with chest x-ray showing pulmonary edema.  12/1: desats to 85% doing ADL's on 2 L/min O2. Wt down about 9 lbs. I/O's inaccurate.  Assessment & Plan   Principal Problem:   SOB (shortness of breath) Active Problems:   Essential hypertension   Type 2 diabetes, controlled, with renal manifestation (HCC)   Obesity, Class III, BMI 40-49.9 (morbid obesity) (HCC)   Acute on chronic diastolic CHF (congestive heart failure) (HCC)   ESRD on hemodialysis (HCC)   Anemia   Acute respiratory failure with hypoxia (HCC)   Acute respiratory failure with hypoxia -initial SPO2 per EMS was 85% on room air.  Patient initially required 6 L/min supplemental O2 on admission.   VQ scan negative for evidence of PE.  Lower extremity Doppler ultrasounds negative bilaterally for DVT. --Supplemental O2 to maintain sats above 90% --Incentive spirometry --Monitor for signs of respiratory infection (so far afebrile, no leukocytosis, intermittent dry cough which is patient's baseline) --Volume removal by dialysis and diuretic as below  Acute on chronic diastolic CHF -patient had signs of pulmonary edema on admission x-ray, presented with shortness of breath. EF greater than 62%, grade 1 diastolic dysfunction, moderate LVH. --Volume removal by dialysis --Hold home torsemide for today (takes on nondialysis days) --IV Lasix 60 mg x 1 today --Strict I/O's and daily weights to assess volume status  ESRD -on hemodialysis MWF.  Had dialysis today 11/30. --Nephrology  following --Ultrafiltration as tolerated for volume removal  Seizure disorder, meningioma -continue home Keppra  Essential hypertension -continue Norvasc metoprolol  Hyperlipidemia -continue atorvastatin  Morbid obesity: Body mass index is 41.13 kg/m.  Complicates overall care and prognosis.  Recommend lifestyle modifications including physical activity and diet for weight loss and overall long-term health.   DVT prophylaxis: heparin injection 5,000 Units Start: 05/01/21 2200   Diet:  Diet Orders (From admission, onward)     Start     Ordered   05/02/21 1050  Diet renal with fluid restriction Fluid restriction: 1200 mL Fluid; Room service appropriate? Yes; Fluid consistency: Thin  Diet effective now       Question Answer Comment  Fluid restriction: 1200 mL Fluid   Room service appropriate? Yes   Fluid consistency: Thin      05/02/21 1049              Code Status: Full Code   Subjective 05/04/21    Patient up in recliner when seen.  She reports overall feeling better.  No shortness of breath, still desaturating with exertion however.  Reports overall her energy level has improved.  No fevers chills or productive cough.  No other acute complaints.  Agreeable to home oxygen if she needs it.   Disposition Plan & Communication   Status is: Inpatient  Remains inpatient appropriate because: Continues to have oxygen desaturations requiring supplementation.  Further IV diuresis and attempt to wean off O2.  If Unable to Wean, Anticipate Discharge in 24 Hours with Home Oxygen.   Family Communication: Daughter at bedside on rounds today, another daughter and other family member on speaker phone during encounter  Consults, Procedures, Significant Events   Consultants:  Nephrology  Procedures:  Dialysis  Antimicrobials:  Anti-infectives (From admission, onward)    None         Micro    Objective   Vitals:   05/04/21 0447 05/04/21 0953 05/04/21 1155  05/04/21 1553  BP:  121/61 121/62 116/69  Pulse:  66 65 68  Resp:  16 20 18   Temp:  97.8 F (36.6 C) 98 F (36.7 C) 97.9 F (36.6 C)  TempSrc:   Oral Oral  SpO2:  95% 99% 99%  Weight: 102 kg     Height:        Intake/Output Summary (Last 24 hours) at 05/04/2021 1741 Last data filed at 05/04/2021 1500 Gross per 24 hour  Intake 720 ml  Output --  Net 720 ml   Filed Weights   05/03/21 1000 05/03/21 1315 05/04/21 0447  Weight: 105.5 kg 103 kg 102 kg    Physical Exam:  General exam: awake, alert, no acute distress, obese Respiratory system: CTAB with diminished bases, normal respiratory effort at rest, on 2 L/min nasal cannula oxygen, no wheezes rales or rhonchi heard. Cardiovascular system: normal S1/S2, RRR, no peripheral edema.   Central nervous system: A&O x4. Grossly non-focal exam, normal speech Psychiatry: normal mood, congruent affect, judgement and insight appear normal  Labs   Data Reviewed: I have personally reviewed following labs and imaging studies  CBC: Recent Labs  Lab 05/01/21 1043 05/02/21 0524 05/04/21 0534  WBC 6.8 5.5 4.2  NEUTROABS 5.0  --   --   HGB 9.8* 9.7* 10.0*  HCT 31.2* 30.9* 30.8*  MCV 97.5 96.9 94.8  PLT 194 170 671   Basic Metabolic Panel: Recent Labs  Lab 05/01/21 1043 05/02/21 0524 05/04/21 0534  NA 139 134* 134*  K 4.3 3.5 3.9  CL 102 96* 95*  CO2 28 32 30  GLUCOSE 109* 98 101*  BUN 49* 28* 36*  CREATININE 10.22* 6.61* 6.90*  CALCIUM 8.4* 8.2* 8.3*   GFR: Estimated Creatinine Clearance: 8.2 mL/min (A) (by C-G formula based on SCr of 6.9 mg/dL (H)). Liver Function Tests: Recent Labs  Lab 05/01/21 1043 05/02/21 0524  AST 20 14*  ALT 14 12  ALKPHOS 102 95  BILITOT 0.7 0.7  PROT 7.9 7.3  ALBUMIN 4.0 3.6   No results for input(s): LIPASE, AMYLASE in the last 168 hours. No results for input(s): AMMONIA in the last 168 hours. Coagulation Profile: Recent Labs  Lab 05/01/21 1043  INR 1.0   Cardiac Enzymes: No  results for input(s): CKTOTAL, CKMB, CKMBINDEX, TROPONINI in the last 168 hours. BNP (last 3 results) No results for input(s): PROBNP in the last 8760 hours. HbA1C: No results for input(s): HGBA1C in the last 72 hours. CBG: No results for input(s): GLUCAP in the last 168 hours. Lipid Profile: No results for input(s): CHOL, HDL, LDLCALC, TRIG, CHOLHDL, LDLDIRECT in the last 72 hours. Thyroid Function Tests: No results for input(s): TSH, T4TOTAL, FREET4, T3FREE, THYROIDAB in the last 72 hours. Anemia Panel: No results for input(s): VITAMINB12, FOLATE, FERRITIN, TIBC, IRON, RETICCTPCT in the last 72 hours. Sepsis Labs: No results for input(s): PROCALCITON, LATICACIDVEN in the last 168 hours.  Recent Results (from the past 240 hour(s))  Resp Panel by RT-PCR (Flu A&B, Covid) Nasopharyngeal Swab     Status: None   Collection Time: 05/01/21  9:42 AM   Specimen: Nasopharyngeal Swab; Nasopharyngeal(NP) swabs in vial transport medium  Result Value Ref Range Status  SARS Coronavirus 2 by RT PCR NEGATIVE NEGATIVE Final    Comment: (NOTE) SARS-CoV-2 target nucleic acids are NOT DETECTED.  The SARS-CoV-2 RNA is generally detectable in upper respiratory specimens during the acute phase of infection. The lowest concentration of SARS-CoV-2 viral copies this assay can detect is 138 copies/mL. A negative result does not preclude SARS-Cov-2 infection and should not be used as the sole basis for treatment or other patient management decisions. A negative result may occur with  improper specimen collection/handling, submission of specimen other than nasopharyngeal swab, presence of viral mutation(s) within the areas targeted by this assay, and inadequate number of viral copies(<138 copies/mL). A negative result must be combined with clinical observations, patient history, and epidemiological information. The expected result is Negative.  Fact Sheet for Patients:   EntrepreneurPulse.com.au  Fact Sheet for Healthcare Providers:  IncredibleEmployment.be  This test is no t yet approved or cleared by the [place] FDA and  has been authorized for detection and/or diagnosis of SARS-CoV-2 by FDA under an Emergency Use Authorization (EUA). This EUA will remain  in effect (meaning this test can be used) for the duration of the COVID-19 declaration under Section 564(b)(1) of the Act, 21 U.S.C.section 360bbb-3(b)(1), unless the authorization is terminated  or revoked sooner.       Influenza A by PCR NEGATIVE NEGATIVE Final   Influenza B by PCR NEGATIVE NEGATIVE Final    Comment: (NOTE) The Xpert Xpress SARS-CoV-2/FLU/RSV plus assay is intended as an aid in the diagnosis of influenza from Nasopharyngeal swab specimens and should not be used as a sole basis for treatment. Nasal washings and aspirates are unacceptable for Xpert Xpress SARS-CoV-2/FLU/RSV testing.  Fact Sheet for Patients: EntrepreneurPulse.com.au  Fact Sheet for Healthcare Providers: IncredibleEmployment.be  This test is not yet approved or cleared by the [place] FDA and has been authorized for detection and/or diagnosis of SARS-CoV-2 by FDA under an Emergency Use Authorization (EUA). This EUA will remain in effect (meaning this test can be used) for the duration of the COVID-19 declaration under Section 564(b)(1) of the Act, 21 U.S.C. section 360bbb-3(b)(1), unless the authorization is terminated or revoked.  Performed at [facility], [address], [place], [place] [address]       Imaging Studies   No results found.   Medications   Scheduled Meds:  (feeding supplement) PROSource Plus  30 mL Oral BID BM   amLODipine  10 mg Oral Daily   aspirin EC  81 mg Oral Daily   atorvastatin  40 mg Oral Daily   Chlorhexidine Gluconate Cloth  6 each Topical Q0600   heparin  5,000 Units  Subcutaneous Q12H   levETIRAcetam  1,000 mg Oral BID   losartan  25 mg Oral Once per day on Sun Tue Thu Sat   And   losartan  50 mg Oral Once per day on Mon Wed Fri   metoprolol succinate  25 mg Oral QPM   multivitamin  1 tablet Oral QHS   pantoprazole  40 mg Oral Daily   sevelamer carbonate  1,600 mg Oral TID WC   sodium chloride flush  3 mL Intravenous Q12H   Continuous Infusions:     LOS: 3 days    Time spent: 27 minutes    Ezekiel Slocumb, DO [facility]  05/04/2021, 5:41 PM      If 7PM-7AM, please contact night-coverage. How to contact the [facility] Attending or Consulting provider [place] or covering provider during after hours  7P -7A, for this patient?    Check the care team in [facility] and look for a) attending/consulting [facility] provider listed and b) the [facility] team listed Log into [email or web address] and use [place]'s universal password to access. If you do not have the password, please contact the hospital operator. Locate the [facility] provider you are looking for under [facility] and page to a number that you can be directly reached. If you still have difficulty reaching the provider, please page the [facility] (Director on Call) for the Hospitalists listed on amion for assistance.

## 2021-05-04 NOTE — Progress Notes (Signed)
Mobility Specialist - Progress Note   05/04/21 1548  Mobility  Activity Ambulated in hall  Level of Assistance Standby assist, set-up cues, supervision of patient - no hands on  Assistive Device None  Distance Ambulated (ft) 160 ft  Mobility Ambulated with assistance in hallway  Mobility Response Tolerated well  Mobility performed by Mobility specialist  $Mobility charge 1 Mobility    Pre-mobility: 75 HR, 98% SpO2 During mobility: 88 HR, 86-92% SpO2 Post-mobility: 70 HR, 100% SpO2   Pt sitting in recliner upon arrival, utilizing 1L. Pt ambulated in hallway with supervision, no LOB. x1 standing rest break x1 seated break. O2 maintained 90-92% most of ambulation, but did desat briefly to 86% during last 40'. Winded from activity. RPE 5/10. Pt returned to recliner with needs in reach, family at bedside.    Kathee Delton Mobility Specialist 05/04/21, 3:52 PM

## 2021-05-04 NOTE — Progress Notes (Signed)
Nutrition Follow-up  DOCUMENTATION CODES:   Morbid obesity  INTERVENTION:   -Continue Renal MVI daily -Continue 30 ml Prosource Plus BID, each supplement provides 100 kcals and 15 grams protein -Reviewed renal diet guidelines with pt. Provided "[place] for Patients with Kidney Disease"; attached to AVS/ discharge summary  NUTRITION DIAGNOSIS:   Increased nutrient needs related to chronic illness (ESRD on HD) as evidenced by estimated needs.  Ongoing  GOAL:   Patient will meet greater than or equal to 90% of their needs  Progressing   MONITOR:   PO intake, Supplement acceptance, Labs, Weight trends, I & O's, Skin  REASON FOR ASSESSMENT:   Consult Assessment of nutrition requirement/status, Diet education  ASSESSMENT:   Carrie [name] is a 70 y.o. female seen in ed with complaints of shortness of breath since waking up today, EMS was called with her O2 sats at 85% on room air.  Patient is a hemodialysis patient and had her last session on Friday she is a Monday Wednesday Friday schedule, initially patient satting 99% on 6 L nasal cannula.  Patient presents to [place] today with shortness of breath after having her dialysis earlier today.  After initial evaluation patient was taken to dialysis for for her continued worsening and hypoxia and respiratory failure goal of 2 L ultrafiltration per nephrology note.  Reviewed I/O's: -534 ml x 24 hours and -1.9 L since admission  Spoke with pt and sister at bedside. Pt reports fair appetite; she typically consumes 2-3 meals per day. Routine is the same for HD and non-HD days, however, pt tends to skip breakfast on HD days (Breakfast: chicken sandwich; Lunch: cottage cheese with fruit or apple with peanut butter; Dinner: meat, starch, and vegetable). She sticks with her fluid restriction and rarely eats out. She takes her renal MVI daily and 1-2 binders at each meal.   Per pt, she is oin the "[employer]" at her HD clinic. She  has a good relationship with her HD RD and reports her labs are WDL. Her family is very supportive and helps cook meals; they are very cognizant about what they eat secondary to kidney disease and heart disease in the family.   Explained why diet restrictions are needed and provided lists of foods to limit/avoid that are high potassium, sodium, and phosphorus. Provided specific recommendations on safer alternatives of these foods. Strongly encouraged compliance of this diet.   Discussed importance of protein intake at each meal and snack. Provided examples of how to maximize protein intake throughout the day. Discussed need for fluid restriction with dialysis, importance of minimizing weight gain between HD treatments, and renal-friendly beverage options.  Encouraged pt to discuss specific diet questions/concerns with RD at HD outpatient facility. Teach back method used.  Expect good compliance.  Per pt, her EDW is 104 kg. It has progressively decreased from 112 kg when she first started HD 3 years ago, but pt is intentionally losing weight to help her quality for a knee replacement and kidney transplant. Reviewed wt hx; pt has experienced a 5% wt loss over the past 3 months, which is not significant for time frame.   Discussed importance of good meal and supplement intake to promote healing.   Medications reviewed and include keppra and renvela.   Labs reviewed: Na: 134.    NUTRITION - FOCUSED PHYSICAL EXAM:  Flowsheet Row Most Recent Value  Orbital Region No depletion  Upper Arm Region No depletion  Thoracic and Lumbar Region No  depletion  Buccal Region No depletion  Temple Region No depletion  Clavicle Bone Region No depletion  Clavicle and Acromion Bone Region No depletion  Scapular Bone Region No depletion  Dorsal Hand No depletion  Patellar Region No depletion  Anterior Thigh Region No depletion  Posterior Calf Region No depletion  Edema (RD Assessment) Mild  Hair Reviewed   Eyes Reviewed  Mouth Reviewed  Skin Reviewed  Nails Reviewed       Diet Order:   Diet Order             Diet renal with fluid restriction Fluid restriction: 1200 mL Fluid; Room service appropriate? Yes; Fluid consistency: Thin  Diet effective now                   EDUCATION NEEDS:   Education needs have been addressed  Skin:  Skin Assessment: Reviewed RN Assessment  Last BM:  05/03/21  Height:   Ht Readings from Last 1 Encounters:  05/02/21 5\' 2"  (1.575 m)    Weight:   Wt Readings from Last 1 Encounters:  05/04/21 102 kg    Ideal Body Weight:  50 kg  BMI:  Body mass index is 41.13 kg/m.  Estimated Nutritional Needs:   Kcal:  1800-2000  Protein:  100-115 grams  Fluid:  1000 ml + UOP    Loistine Chance, RD, LDN, [place] Registered Dietitian II Certified Diabetes Care and Education Specialist Please refer to [facility] for RD and/or RD on-call/weekend/after hours pager

## 2021-05-04 NOTE — Progress Notes (Signed)
[facility]  ROUNDING NOTE   Subjective:   Carrie [name] is a 70 year old female with past medical history including anemia, end-stage renal disease on dialysis, depression, diabetes, and hypertension.  She reports to the emergency room with complaints of shortness of breath that began this morning when she awoke.  Patient was scheduled for dialysis today at outpatient clinic, but states her shortness of breath was too bad she called EMS.  Patient is known to our office and receives outpatient dialysis treatments at [facility] on a MWF schedule, supervised by Dr. Juleen China.    Patient seen sitting up in chair Alert and oriented Tolerating meals Continues to have shortness of breath on exertion Supplemental oxygen 2L  Objective:  Vital signs in last 24 hours:  Temp:  [97.8 F (36.6 C)-98.5 F (36.9 C)] 98 F (36.7 C) (12/01 1155) Pulse Rate:  [63-79] 65 (12/01 1155) Resp:  [14-22] 20 (12/01 1155) BP: (104-121)/(56-69) 121/62 (12/01 1155) SpO2:  [72 %-100 %] 99 % (12/01 1155) Weight:  [102 kg] 102 kg (12/01 0447)  Weight change: 1.5 kg Filed Weights   05/03/21 1000 05/03/21 1315 05/04/21 0447  Weight: 105.5 kg 103 kg 102 kg    Intake/Output: I/O last 3 completed shifts: In: 960 [P.O.:960] Out: 1494 [IPJAS:5053]   Intake/Output this shift:  No intake/output data recorded.  Physical Exam: General: NAD, resting comfortably  Head: Normocephalic, atraumatic. Moist oral mucosal membranes  Eyes: Anicteric  Lungs:  Clear to auscultation, normal effort, O2 2L [place]  Heart: Regular rate and rhythm  Abdomen:  Soft, nontender, nondistended  Extremities: Trace peripheral edema.  Neurologic: Nonfocal, moving all four extremities  Skin: No lesions  Access: Left upper extremity AVG    Basic Metabolic Panel: Recent Labs  Lab 05/01/21 1043 05/02/21 0524 05/04/21 0534  NA 139 134* 134*  K 4.3 3.5 3.9  CL 102 96* 95*  CO2 28 32 30  GLUCOSE 109* 98 101*   BUN 49* 28* 36*  CREATININE 10.22* 6.61* 6.90*  CALCIUM 8.4* 8.2* 8.3*     Liver Function Tests: Recent Labs  Lab 05/01/21 1043 05/02/21 0524  AST 20 14*  ALT 14 12  ALKPHOS 102 95  BILITOT 0.7 0.7  PROT 7.9 7.3  ALBUMIN 4.0 3.6    No results for input(s): LIPASE, AMYLASE in the last 168 hours. No results for input(s): AMMONIA in the last 168 hours.  CBC: Recent Labs  Lab 05/01/21 1043 05/02/21 0524 05/04/21 0534  WBC 6.8 5.5 4.2  NEUTROABS 5.0  --   --   HGB 9.8* 9.7* 10.0*  HCT 31.2* 30.9* 30.8*  MCV 97.5 96.9 94.8  PLT 194 170 176     Cardiac Enzymes: No results for input(s): CKTOTAL, CKMB, CKMBINDEX, TROPONINI in the last 168 hours.  BNP: Invalid input(s): POCBNP  CBG: No results for input(s): GLUCAP in the last 168 hours.  Microbiology: Results for orders placed or performed during the hospital encounter of 05/01/21  Resp Panel by RT-PCR (Flu A&B, Covid) Nasopharyngeal Swab     Status: None   Collection Time: 05/01/21  9:42 AM   Specimen: Nasopharyngeal Swab; Nasopharyngeal(NP) swabs in vial transport medium  Result Value Ref Range Status   SARS Coronavirus 2 by RT PCR NEGATIVE NEGATIVE Final    Comment: (NOTE) SARS-CoV-2 target nucleic acids are NOT DETECTED.  The SARS-CoV-2 RNA is generally detectable in upper respiratory specimens during the acute phase of infection. The lowest concentration of SARS-CoV-2 viral copies this  assay can detect is 138 copies/mL. A negative result does not preclude SARS-Cov-2 infection and should not be used as the sole basis for treatment or other patient management decisions. A negative result may occur with  improper specimen collection/handling, submission of specimen other than nasopharyngeal swab, presence of viral mutation(s) within the areas targeted by this assay, and inadequate number of viral copies(<138 copies/mL). A negative result must be combined with clinical observations, patient history, and  epidemiological information. The expected result is Negative.  Fact Sheet for Patients:  EntrepreneurPulse.com.au  Fact Sheet for Healthcare Providers:  IncredibleEmployment.be  This test is no t yet approved or cleared by the [place] FDA and  has been authorized for detection and/or diagnosis of SARS-CoV-2 by FDA under an Emergency Use Authorization (EUA). This EUA will remain  in effect (meaning this test can be used) for the duration of the COVID-19 declaration under Section 564(b)(1) of the Act, 21 U.S.C.section 360bbb-3(b)(1), unless the authorization is terminated  or revoked sooner.       Influenza A by PCR NEGATIVE NEGATIVE Final   Influenza B by PCR NEGATIVE NEGATIVE Final    Comment: (NOTE) The Xpert Xpress SARS-CoV-2/FLU/RSV plus assay is intended as an aid in the diagnosis of influenza from Nasopharyngeal swab specimens and should not be used as a sole basis for treatment. Nasal washings and aspirates are unacceptable for Xpert Xpress SARS-CoV-2/FLU/RSV testing.  Fact Sheet for Patients: EntrepreneurPulse.com.au  Fact Sheet for Healthcare Providers: IncredibleEmployment.be  This test is not yet approved or cleared by the [place] FDA and has been authorized for detection and/or diagnosis of SARS-CoV-2 by FDA under an Emergency Use Authorization (EUA). This EUA will remain in effect (meaning this test can be used) for the duration of the COVID-19 declaration under Section 564(b)(1) of the Act, 21 U.S.C. section 360bbb-3(b)(1), unless the authorization is terminated or revoked.  Performed at [facility] [address] [place], [place] [address]     Coagulation Studies: No results for input(s): LABPROT, INR in the last 72 hours.   Urinalysis: No results for input(s): COLORURINE, LABSPEC, PHURINE, GLUCOSEU, HGBUR, BILIRUBINUR, KETONESUR, PROTEINUR, UROBILINOGEN,  NITRITE, LEUKOCYTESUR in the last 72 hours.  Invalid input(s): APPERANCEUR    Imaging: No results found.   Medications:      (feeding supplement) PROSource Plus  30 mL Oral BID BM   amLODipine  10 mg Oral Daily   aspirin EC  81 mg Oral Daily   atorvastatin  40 mg Oral Daily   Chlorhexidine Gluconate Cloth  6 each Topical Q0600   heparin  5,000 Units Subcutaneous Q12H   levETIRAcetam  1,000 mg Oral BID   losartan  25 mg Oral Once per day on Sun Tue Thu Sat   And   losartan  50 mg Oral Once per day on Mon Wed Fri   metoprolol succinate  25 mg Oral QPM   multivitamin  1 tablet Oral QHS   pantoprazole  40 mg Oral Daily   sevelamer carbonate  1,600 mg Oral TID WC   sodium chloride flush  3 mL Intravenous Q12H   acetaminophen **OR** acetaminophen, acetaminophen, albuterol, bisacodyl, guaiFENesin-dextromethorphan, hydrALAZINE, LORazepam, morphine injection, ondansetron **OR** ondansetron (ZOFRAN) IV, sevelamer carbonate  Assessment/ Plan:  Ms. JEWELIANA DUDGEON is a 70 y.o.  female with past medical history including anemia, end-stage renal disease on dialysis, depression, diabetes, and hypertension.  She reports to the emergency room with complaints of shortness of breath that began this morning when  she awoke.    Shortness of breath due to pulmonary edema seen on chest x-ray.  Shortness of breath with exertion. Per therapy note, oxygen decreased to 84%  during ambulation. May need to d/c with oxygen.   End-stage renal disease on dialysis.  Patient received dialysis yesterday.  UF goal of 1.5 L achieved.  Next treatment scheduled for tomorrow.  3. Anemia of chronic kidney disease Lab Results  Component Value Date   HGB 10.0 (L) 05/04/2021  We will continue to monitor for ESA needs  4. Secondary Hyperparathyroidism:  Lab Results  Component Value Date   CALCIUM 8.3 (L) 05/04/2021   PHOS 4.6 08/06/2018   Sevelamer ordered with meals  5.  Hypertension with chronic kidney  disease.  Home regimen includes amlodipine, losartan, metoprolol, and torsemide.  BP 121/62   LOS: 3 Purvis [place]le 12/1/20221:52 PM

## 2021-05-04 NOTE — Evaluation (Signed)
Occupational Therapy Evaluation Patient Details Name: Carrie [name] MRN: [id number] DOB: 23-Feb-1951 Today's Date: 05/04/2021   History of Present Illness Patient is a 70 year old female who reports to [facility] with complaints of SOB. Patient is a hemodialysis patient and had her last session on Friday she is a Monday Wednesday Friday schedule. Patient has a past medical history of allergies to naproxen and Bactrim, colon polyps, eustachian tube dysfunction, end-stage renal disease, chronic diastolic CHF, osteoarthritis, obesity, diabetes mellitus type 2, hypertension, history of meningioma.   Clinical Impression   Pt awake in bed with daughter present in room.  Pt receptive to OT eval and eager to get out of bed and move around.  Pt performed functional mobility in room this a.m. with close supv to reach bathroom for toileting, min guard in bathroom to manage 02 tubing within crowded space.  Completed functional transfers all with supv.  Standing ADLs this a.m caused 02 to desat to 85% on 2L, though quick return to 93% with short seated rest break and cues for pursed lip breathing.  Pt does not require 02 at home at baseline.  EC strategies and PLB reinforced throughout session with good return demo.  Pt resides with multiple family members who provide 24/7 supv at baseline.  Pt also drives, was modified indep with basic self care, and amb in home without AD.  Pt will benefit from acute OT while hospitalized for increasing activity tolerance for self care, reinforcing EC strategies, fall prevention, and developing HEP.  Recommending HH OT at discharge to ensure safe return to PLOF.  Pt requests [facility] ([facility]viously [facility]) if possible d/t previous use of this company and familiarity with therapists.   Recommendations for follow up therapy are one component of a multi-disciplinary discharge planning process, led by the attending physician.  Recommendations may be updated based on patient  status, additional functional criteria and insurance authorization.   Follow Up Recommendations  Home health OT    Assistance Recommended at Discharge Intermittent Supervision/Assistance  Functional Status Assessment  Patient has had a recent decline in their functional status and demonstrates the ability to make significant improvements in function in a reasonable and predictable amount of time.                Precautions / Restrictions Precautions Precautions: Fall Restrictions Weight Bearing Restrictions: No      Mobility Bed Mobility Overal bed mobility: Modified Independent             General bed mobility comments: Bed mobility completed with HOB elevated Patient Response: Cooperative  Transfers Overall transfer level: Needs assistance Equipment used: None Transfers: Sit to/from Stand Sit to Stand: Supervision Stand pivot transfers: Min guard         General transfer comment: min guard to manage cords in crowded environment      Balance Overall balance assessment: Needs assistance Sitting-balance support: Feet supported Sitting balance-Leahy Scale: Good Sitting balance - Comments: able to reach outside BOS w/ feet firmly supported   Standing balance support: No upper extremity supported Standing balance-Leahy Scale: Good Standing balance comment: Pt was able to stand at sink and weight shift while brushing teeth and washing hands with close supv, no LOB and no UE support.                           ADL either performed or assessed with clinical judgement   ADL Overall ADL's :  Needs assistance/impaired     Grooming: Supervision/safety;Oral care;Standing Grooming Details (indicate cue type and reason): vc for EC strategies while standing at sink to brush teeth.  02 sats dropped to 84 at 2L 02.  Quick return to 93% with cues for PLB and 1 min seated rest break.             Lower Body Dressing: Set up Lower Body Dressing Details  (indicate cue type and reason): able to don hospital socks sitting up in bed, crossing legs at knees Toilet Transfer: Supervision/safety;Grab bars Toilet Transfer Details (indicate cue type and reason): supv only to manage long 02 tubing Toileting- Clothing Manipulation and Hygiene: Independent       Functional mobility during ADLs: Supervision/safety;Min guard General ADL Comments: ambulatory without AD in room, close supv for ambulation near bedside, min guard in bathroom d/t managing 02 tubing in crowded environment.     Vision Baseline Vision/History: 1 Wears glasses Patient Visual Report: No change from baseline                  Pertinent Vitals/Pain Pain Assessment: No/denies pain     Hand Dominance Right   Extremity/Trunk Assessment Upper Extremity Assessment Upper Extremity Assessment: Overall WFL for tasks assessed   Lower Extremity Assessment Lower Extremity Assessment: Defer to PT evaluation       Communication Communication Communication: No difficulties   Cognition Arousal/Alertness: Awake/alert Behavior During Therapy: WFL for tasks assessed/performed Overall Cognitive Status: Within Functional Limits for tasks assessed                                 General Comments: Good historian, pleasant, cooperative                      Home Living Family/patient expects to be discharged to:: Private residence Living Arrangements: Other relatives (lives with daughter, 2 sisters, nephew and his daughter) Available Help at Discharge: Family;Available 24 hours/day Type of Home: House Home Access: Stairs to enter [employer] of Steps: 2 Entrance Stairs-Rails: None Home Layout: Two level;Able to live on main level with bedroom/bathroom Alternate Level Stairs-Number of Steps: 21 but pt no longer uses 2nd level of home   Bathroom Shower/Tub: Teacher, early years/pre: Standard Bathroom Accessibility: Yes   Home  Equipment: Rollator (4 wheels);Shower seat;Grab bars - tub/shower          Prior Functioning/Environment Prior Level of Function : Independent/Modified Independent             Mobility Comments: Pt ambulatory in home without AD at baseline ADLs Comments: pt participates in light cleaning, light meal prep, and was modified indep with all basic self care with the exception of sister helps to transfer pt into tub with shower seat.  Pt manages her own bathing while seated on shower chair.        OT Problem List: Decreased activity tolerance      OT Treatment/Interventions: Self-care/ADL training;Therapeutic exercise;Energy conservation    OT Goals(Current goals can be found in the care plan section) Acute Rehab OT Goals Patient Stated Goal: to go home with family support; pt has 24/7 family supv at baseline d/t multiple family members living in the home OT Goal Formulation: With patient Time For Goal Achievement: 05/17/21 Potential to Achieve Goals: Good  OT Frequency: Min 2X/week   Barriers to D/C:    none  AM-PAC OT "6 Clicks" Daily Activity     Outcome Measure Help from another person eating meals?: None Help from another person taking care of personal grooming?: A Little Help from another person toileting, which includes using toliet, bedpan, or urinal?: A Little Help from another person bathing (including washing, rinsing, drying)?: A Little Help from another person to put on and taking off regular upper body clothing?: None Help from another person to put on and taking off regular lower body clothing?: A Little 6 Click Score: 20   End of Session Equipment Utilized During Treatment: Oxygen Nurse Communication: Mobility status  Activity Tolerance: Patient tolerated treatment well;Other (comment) (02 desaturation) Patient left: in chair;with call bell/phone within reach;with nursing/sitter in room;with family/visitor present  OT Visit  Diagnosis: Other abnormalities of gait and mobility (R26.89);Muscle weakness (generalized) (M62.81)                Time: 0998-3382 OT Time Calculation (min): 30 min Charges:  OT General Charges $OT Visit: 1 Visit OT Evaluation $OT Eval Moderate Complexity: 1 Mod OT Treatments $Self Care/Home Management : 8-22 mins  Leta Speller, MS, OTR/L   Darleene Cleaver 05/04/2021, 9:39 AM

## 2021-05-05 ENCOUNTER — Telehealth: Payer: Self-pay | Admitting: Internal Medicine

## 2021-05-05 LAB — BASIC METABOLIC PANEL
Anion gap: 10 (ref 5–15)
BUN: 56 mg/dL — ABNORMAL HIGH (ref 8–23)
CO2: 29 mmol/L (ref 22–32)
Calcium: 8.3 mg/dL — ABNORMAL LOW (ref 8.9–10.3)
Chloride: 95 mmol/L — ABNORMAL LOW (ref 98–111)
Creatinine, Ser: 8.77 mg/dL — ABNORMAL HIGH (ref 0.44–1.00)
GFR, Estimated: 4 mL/min — ABNORMAL LOW (ref >60)
Glucose, Bld: 87 mg/dL (ref 70–99)
Potassium: 3.9 mmol/L (ref 3.5–5.1)
Sodium: 134 mmol/L — ABNORMAL LOW (ref 135–145)

## 2021-05-05 MED ORDER — PROSOURCE PLUS PO LIQD: 30.0000 mL | Freq: Two times a day (BID) | ORAL | 1 refills | Status: DC

## 2021-05-05 MED ORDER — GUAIFENESIN-DM 100-10 MG/5ML PO SYRP: 5.0000 mL | ORAL_SOLUTION | ORAL | 0 refills | Status: DC | PRN

## 2021-05-05 MED ORDER — LEVETIRACETAM 1000 MG PO TABS: 1000.0000 mg | ORAL_TABLET | Freq: Two times a day (BID) | ORAL | 3 refills | Status: DC

## 2021-05-05 MED ORDER — ALBUTEROL SULFATE HFA 108 (90 BASE) MCG/ACT IN AERS: 2.0000 | INHALATION_SPRAY | Freq: Four times a day (QID) | RESPIRATORY_TRACT | 2 refills | Status: DC | PRN

## 2021-05-05 NOTE — Progress Notes (Signed)
SATURATION QUALIFICATIONS: (This note is used to comply with regulatory documentation for home oxygen)  Patient Saturations on Room Air at Rest = 96%  Patient Saturations on Room Air while Ambulating = 79%  Patient Saturations on 2 Liters of oxygen while Ambulating = 99%  Please briefly explain why patient needs home oxygen:

## 2021-05-05 NOTE — Progress Notes (Signed)
PT Cancellation Note  Patient Details Name: Carrie [name] MRN: [id number] DOB: May 21, 1951   Cancelled Treatment:    Reason Eval/Treat Not Completed: Patient at procedure or test/unavailable (Patient currently off unit at dialysis.  Will re-attempt at later time/date as medically appropriate and available.)  Cauy Melody H. Owens Shark, PT, DPT, NCS 05/05/21, 10:01 AM [phone]

## 2021-05-05 NOTE — Discharge Summary (Signed)
Physician Discharge Summary   Patient name: Carrie [name]  Admit date:     05/01/2021  Discharge date: 05/05/2021  Discharge Physician: Ezekiel Slocumb   PCP: Venia Carbon, MD   Recommendations at discharge:   Follow up with PCP in 1-2 weeks Follow up with nephrology and all dialysis sesions   Discharge Diagnoses Principal Problem:   SOB (shortness of breath) Active Problems:   Essential hypertension   Type 2 diabetes, controlled, with renal manifestation (HCC)   Obesity, Class III, BMI 40-49.9 (morbid obesity) (HCC)   Acute on chronic diastolic CHF (congestive heart failure) (HCC)   ESRD on hemodialysis ([facility])   Anemia   Acute respiratory failure with hypoxia [facility])      Hospital Course   70 year old female with past medical history of end-stage renal disease on dialysis Monday, Wednesday Friday, seizure disorder/meningioma, hypertension, hyperlipidemia.  Patient presenting with shortness of breath.  Patient underwent dialysis on admission with chest x-ray showing pulmonary edema.    12/2: patient continued to have O2 desats with exertion, qualified for home oxygen.  Otherwise clinically improved and stable for discharge with home oxygen.    Acute respiratory failure with hypoxia -initial SPO2 per EMS was 85% on room air.  Patient initially required 6 L/min supplemental O2 on admission.   VQ scan negative for evidence of PE.  Lower extremity Doppler ultrasounds negative bilaterally for DVT. --Supplemental O2 to maintain sats above 90% --Incentive spirometry --Monitor for signs of respiratory infection (so far afebrile, no leukocytosis, intermittent dry cough which is patient's baseline) --Volume removal by dialysis and diuretic as below  Patient qualified for home oxygen with spO2 79% with ambulation on room air.     Acute on chronic diastolic CHF -patient had signs of pulmonary edema on admission x-ray, presented with shortness of breath. EF greater than 55%,  grade 1 diastolic dysfunction, moderate LVH. --Volume removal by dialysis --Resume torsemide on nondialysis days --Strict I/O's and daily weights to assess volume status   ESRD -on hemodialysis MWF.   --Nephrology following --Ultrafiltration as tolerated for volume removal   Seizure disorder, meningioma -continue home Keppra  Essential hypertension -continue Norvasc metoprolol  Hyperlipidemia -continue atorvastatin   Morbid obesity: Body mass index is 41.13 kg/m.  Complicates overall care and prognosis.  Recommend lifestyle modifications including physical activity and diet for weight loss and overall long-term health.       Procedures performed: Dialysis   Condition at discharge: stable  Exam General exam: awake, alert, no acute distress, obese Respiratory system: CTAB diminished bases, no wheezes, rales or rhonchi, normal respiratory effort. Cardiovascular system: normal S1/S2, RRR, no JVD, murmurs, rubs, gallops, no pedal edema.   Central nervous system: A&O x3. no gross focal neurologic deficits, normal speech Skin: dry, intact, normal temperature Psychiatry: normal mood, congruent affect, judgement and insight appear normal   Disposition: Home with Home Health  Discharge time: greater than 30 minutes.  Follow-up Information     Venia Carbon, MD.   Specialties: Internal Medicine, Pediatrics Contact information: [place] [address] [phone]                 Allergies as of 05/05/2021       Reactions   Naproxen Sodium Anaphylaxis, Swelling   Sulfamethoxazole-trimethoprim Anaphylaxis, Swelling        Medication List     TAKE these medications    (feeding supplement) PROSource Plus liquid Take 30 mLs by mouth 2 (two)  times daily between meals. Start taking on: May 06, 2021   acetaminophen 500 MG tablet Commonly known as: TYLENOL Take 1,000 mg by mouth every 6 (six) hours as needed for mild pain or moderate  pain.   albuterol 108 (90 Base) MCG/ACT inhaler Commonly known as: VENTOLIN HFA Inhale 2 puffs into the lungs every 6 (six) hours as needed for wheezing or shortness of breath.   amLODipine 10 MG tablet Commonly known as: NORVASC Take 10 mg by mouth daily.   aspirin EC 81 MG tablet Take 81 mg by mouth daily.   atorvastatin 40 MG tablet Commonly known as: LIPITOR TAKE 40 MG BY MOUTH ONCE DAILY What changed: See the new instructions.   guaiFENesin-dextromethorphan 100-10 MG/5ML syrup Commonly known as: ROBITUSSIN DM Take 5 mLs by mouth every 4 (four) hours as needed for cough (chest congestion).   levETIRAcetam 1000 MG tablet Commonly known as: KEPPRA Take 1 tablet (1,000 mg total) by mouth 2 (two) times daily.   lidocaine-prilocaine cream Commonly known as: EMLA Apply 1 application topically every Monday, Wednesday, and Friday with hemodialysis.   LORazepam 0.5 MG tablet Commonly known as: ATIVAN Take 1 tablet (0.5 mg total) by mouth 2 (two) times daily as needed for anxiety.   losartan 25 MG tablet Commonly known as: COZAAR Take 25-50 mg by mouth See admin instructions. Take 1 tablet (25mg ) by mouth daily on Tuesday, Thursday, Saturday and Sunday and take 2 tablets (50mg) by mouth daily on Monday, Wednesday and Friday before dialysis   metoprolol succinate 25 MG 24 hr tablet Commonly known as: TOPROL-XL Take 25 mg by mouth every evening.   multivitamin Tabs tablet Take 1 tablet by mouth at bedtime.   omeprazole 20 MG capsule Commonly known as: PRILOSEC Take 1 capsule (20 mg total) by mouth daily.   sevelamer carbonate 800 MG tablet Commonly known as: RENVELA Take 800-1,600 mg by mouth See admin instructions. Take 2 tablets (1600 mg) by mouth three times daily with meals and take 1 tablet (800 mg) by mouth twice daily with snacks   torsemide 20 MG tablet Commonly known as: DEMADEX Take 20 mg by mouth 4 (four) times a week. (Morning of non-dialysis days)                Durable Medical Equipment  (From admission, onward)           Start     Ordered   05/05/21 0805  For home use only DME oxygen  Once       Question Answer Comment  Length of Need Lifetime   Mode or (Route) Nasal cannula   Liters per Minute 2   Frequency Continuous (stationary and portable oxygen unit needed)   Oxygen delivery system Gas      12 /02/22 0805            DG Chest 2 View  Result Date: 05/01/2021 CLINICAL DATA:  Hypoxia EXAM: CHEST - 2 VIEW COMPARISON:  05/01/2021, 08/08/2018 FINDINGS: Cardiomegaly with vascular congestion, mild interstitial pulmonary edema and small bilateral pleural effusions. Globular cardiac configuration. Aortic atherosclerosis. No pneumothorax. Stent in the left upper extremity. IMPRESSION: 1. Cardiomegaly with vascular congestion, mild interstitial edema and small pleural effusions. Globular cardiac configuration either due to multi chamber enlargement and/or pericardial effusion 2. Airspace opacity at the bases, atelectasis versus pneumonia Electronically Signed   By: Donavan Foil M.D.   On: 05/01/2021 18:46   DG Chest 2 View  Result Date: 05/01/2021 CLINICAL DATA:  Shortness  of breath, hypoxia, end-stage renal disease, dialysis patient EXAM: CHEST - 2 VIEW COMPARISON:  08/07/2018 FINDINGS: Heart is enlarged with diffuse increased interstitial opacities worse in the lower lobes and small bilateral pleural effusions. Pattern compatible with mild CHF. There is associated bibasilar opacities favored to be atelectasis. Difficult to exclude basilar pneumonia. No pneumothorax. Aorta atherosclerotic. Degenerative changes of the spine. Bones are osteopenic. Left axillary dialysis access stent noted. IMPRESSION: Cardiomegaly with diffuse mild interstitial edema pattern and small effusions compatible with CHF. Associated bibasilar atelectasis, less likely pneumonia. Aortic Atherosclerosis (ICD10-I70.0). Electronically Signed   By: Jerilynn Mages.  Shick  M.D.   On: 05/01/2021 10:27   NM Pulmonary Perfusion  Result Date: 05/02/2021 CLINICAL DATA:  Respiratory failure shortness of breath EXAM: NUCLEAR MEDICINE PERFUSION LUNG SCAN TECHNIQUE: Perfusion images were obtained in multiple projections after intravenous injection of radiopharmaceutical. Ventilation scans intentionally deferred if perfusion scan and chest x-ray adequate for interpretation during COVID 19 epidemic. RADIOPHARMACEUTICALS:  4.48 mCi Tc-74m MAA IV COMPARISON:  Chest radiograph, 05/01/2021 FINDINGS: Normal, homogeneous perfusion of the lungs. No suspicious perfusion defect. Cardiomegaly. IMPRESSION: 1. Very low probability for pulmonary embolism by modified perfusion only PIOPED criteria (PE absent). 2.  Cardiomegaly. Electronically Signed   By: Delanna Ahmadi M.D.   On: 05/02/2021 11:17   US Venous Img Lower Bilateral (DVT)  Result Date: 05/02/2021 CLINICAL DATA:  Lower extremity edema for 1 month EXAM: BILATERAL LOWER EXTREMITY VENOUS DOPPLER ULTRASOUND TECHNIQUE: Gray-scale sonography with compression, as well as color and duplex ultrasound, were performed to evaluate the deep venous system(s) from the level of the common femoral vein through the popliteal and proximal calf veins. COMPARISON:  None. FINDINGS: VENOUS Normal compressibility of the common femoral, superficial femoral, and popliteal veins, as well as the visualized calf veins. Visualized portions of profunda femoral vein and great saphenous vein unremarkable. No filling defects to suggest DVT on grayscale or color Doppler imaging. Doppler waveforms show normal direction of venous flow, normal respiratory plasticity and response to augmentation. Limited views of the contralateral common femoral vein are unremarkable. OTHER None. Limitations: none IMPRESSION: Negative examination for deep venous thrombosis in the bilateral lower extremities. Electronically Signed   By: Delanna Ahmadi M.D.   On: 05/02/2021 12:20   VAS US  DUPLEX DIALYSIS ACCESS (AVF, AVG)  Result Date: 04/13/2021 DIALYSIS ACCESS Patient Name:  ADAEZE BETTER  Date of Exam:   04/13/2021 Medical Rec #: [id number]         Accession #:    [id number] Date of Birth: 08-07-1950         Patient Gender: F Patient Age:   24 years Exam Location:  [facility] Procedure:      VAS US DUPLEX DIALYSIS ACCESS (AVF, AVG) Referring Phys: Eulogio Ditch --------------------------------------------------------------------------------  Access Site: Left Upper Extremity. Access Type: Brachial Axillary AVG. History: 11/07/2018: Lt Brachial Axillary Arteriovenous Graft Placement;           03/10/2019: PTA and Stent Placement Mid portion of the Left Brachial          Axillary Graft.          07/14/2019: PTA ans Stent placement midportion left Brachial Axillary          AV Graft.          01/07/2020 new stents to mid graft. Comparison Study: 10/13/2020 Performing Technologist: Almira Coaster RVS  Examination Guidelines: A complete evaluation includes B-mode imaging, spectral Doppler, color Doppler, and power Doppler as needed of all  accessible portions of each vessel. Unilateral testing is considered an integral part of a complete examination. Limited examinations for reoccurring indications may be performed as noted.  Findings:   +--------------------+----------+-----------------+--------+ AVG                 PSV (cm/s)Flow Vol (mL/min)Describe +--------------------+----------+-----------------+--------+ Native artery inflow    91          1358                +--------------------+----------+-----------------+--------+ Arterial anastomosis   187                              +--------------------+----------+-----------------+--------+ Prox graft             524                              +--------------------+----------+-----------------+--------+ Mid graft              322                               +--------------------+----------+-----------------+--------+ Distal graft           134                              +--------------------+----------+-----------------+--------+ Venous anastomosis     230                              +--------------------+----------+-----------------+--------+ Venous outflow         120                              +--------------------+----------+-----------------+--------+ +--------------+-------------+---------+---------+---------+-------------------+               Diameter (cm)  Depth  Branching   PSV       Flow Volume                                  (cm)             (cm/s)       (ml/min)       +--------------+-------------+---------+---------+---------+-------------------+ Lt Rad Art                                      77                        Dist                                                                      +--------------+-------------+---------+---------+---------+-------------------+  Summary: The Left Brachial Axillary AVG appears to be patent throughout; Flow Volume appears to be Normal. An Increase in Velocitiy seen in the Left Proximal segment of the AVG; mild stricture.  *See table(s) above for  measurements and observations.  Diagnosing physician: [name] MD Electronically signed by [name] MD on 04/13/2021 at 3:25:26 PM.   --------------------------------------------------------------------------------   Final    ECHOCARDIOGRAM COMPLETE  Result Date: 05/02/2021    ECHOCARDIOGRAM REPORT   Patient Name:   THAILYN KHALID Date of Exam: 05/02/2021 Medical Rec #:  [id number]        Height:       62.5 in Accession #:    [id number]       Weight:       228.2 lb Date of Birth:  [birth year]        BSA:          2.034 m Patient Age:    39 years         BP:           135/81 mmHg Patient Gender: F                HR:           75 bpm. Exam Location:  [facility] Procedure: 2D Echo, Color Doppler and Cardiac Doppler  Indications:     I50.21 congestive heart failure-Acute Systolic  History:         Patient has prior history of Echocardiogram examinations. CKD                  stage III; Risk Factors:Hypertension and Diabetes.  Sonographer:     Charmayne Sheer Referring Phys:  [id number] Gretta Cool PATEL Diagnosing Phys: Nelva Bush MD  Sonographer Comments: No subcostal window. IMPRESSIONS  1. Left ventricular ejection fraction, by estimation, is >55%. The left ventricle has normal function. Left ventricular endocardial border not optimally defined to evaluate regional wall motion. There is moderate left ventricular hypertrophy. Left ventricular diastolic parameters are consistent with Grade I diastolic dysfunction (impaired relaxation).  2. Right ventricular systolic function is normal. The right ventricular size is normal.  3. The mitral valve is degenerative. Trivial mitral valve regurgitation. No evidence of mitral stenosis.  4. The aortic valve was not well visualized. Aortic valve regurgitation is not visualized. No aortic stenosis is present.  5. The inferior vena cava is normal in size with greater than 50% respiratory variability, suggesting right atrial pressure of 3 mmHg. FINDINGS  Left Ventricle: Left ventricular ejection fraction, by estimation, is >55%. The left ventricle has normal function. Left ventricular endocardial border not optimally defined to evaluate regional wall motion. The left ventricular internal cavity size was  normal in size. There is moderate left ventricular hypertrophy. Left ventricular diastolic parameters are consistent with Grade I diastolic dysfunction (impaired relaxation). Right Ventricle: The right ventricular size is normal. No increase in right ventricular wall thickness. Right ventricular systolic function is normal. Left Atrium: Left atrial size was normal in size. Right Atrium: Right atrial size was normal in size. Pericardium: There is no evidence of pericardial effusion. Mitral Valve: The  mitral valve is degenerative in appearance. There is mild thickening of the mitral valve leaflet(s). There is moderate calcification of the mitral valve leaflet(s). Trivial mitral valve regurgitation. No evidence of mitral valve stenosis. MV peak gradient, 3.0 mmHg. The mean mitral valve gradient is 2.0 mmHg. Tricuspid Valve: The tricuspid valve is not well visualized. Tricuspid valve regurgitation is trivial. Aortic Valve: The aortic valve was not well visualized. Aortic valve regurgitation is not visualized. No aortic stenosis is present. Aortic valve mean gradient measures 6.0 mmHg. Aortic valve peak gradient measures 12.5 mmHg. Aortic valve area, by VTI  measures 1.28 cm. Pulmonic Valve: The pulmonic valve was not well visualized. Pulmonic valve regurgitation is trivial. No evidence of pulmonic stenosis. Aorta: The aortic root is normal in size and structure. Pulmonary Artery: The pulmonary artery is of normal size. Venous: The inferior vena cava is normal in size with greater than 50% respiratory variability, suggesting right atrial pressure of 3 mmHg. IAS/Shunts: The interatrial septum was not well visualized.  LEFT VENTRICLE PLAX 2D LVIDd:         4.57 cm   Diastology LVIDs:         3.57 cm   LV e' medial:    5.77 cm/s LV PW:         1.65 cm   LV E/e' medial:  12.4 LV IVS:        1.39 cm   LV e' lateral:   5.11 cm/s LVOT diam:     1.90 cm   LV E/e' lateral: 14.0 LV SV:         44 LV SV Index:   21 LVOT Area:     2.84 cm  RIGHT VENTRICLE RV Basal diam:  3.56 cm LEFT ATRIUM             Index        RIGHT ATRIUM           Index LA diam:        2.90 cm 1.43 cm/m   RA Area:     13.20 cm LA Vol (A2C):   49.1 ml 24.14 ml/m  RA Volume:   28.80 ml  14.16 ml/m LA Vol (A4C):   51.5 ml 25.32 ml/m LA Biplane Vol: 50.4 ml 24.78 ml/m  AORTIC VALVE                     PULMONIC VALVE AV Area (Vmax):    1.36 cm      PV Vmax:       1.22 m/s AV Area (Vmean):   1.43 cm      PV Vmean:      78.600 cm/s AV Area (VTI):      1.28 cm      PV VTI:        0.232 m AV Vmax:           177.00 cm/s   PV Peak grad:  6.0 mmHg AV Vmean:          117.000 cm/s  PV Mean grad:  3.0 mmHg AV VTI:            0.341 m AV Peak Grad:      12.5 mmHg AV Mean Grad:      6.0 mmHg LVOT Vmax:         84.90 cm/s LVOT Vmean:        59.200 cm/s LVOT VTI:          0.154 m LVOT/AV VTI ratio: 0.45  AORTA Ao Root diam: 2.90 cm MITRAL VALVE MV Area (PHT): 4.17 cm    SHUNTS MV Area VTI:   1.93 cm    Systemic VTI:  0.15 m MV Peak grad:  3.0 mmHg    Systemic Diam: 1.90 cm MV Mean grad:  2.0 mmHg MV Vmax:       0.87 m/s MV Vmean:      62.5 cm/s MV Decel Time: 182 msec MV E velocity: 71.60 cm/s MV A velocity: 75.80 cm/s MV E/A ratio:  0.94 Harrell Gave End MD Electronically signed by  Nelva Bush MD Signature Date/Time: 05/02/2021/12:29:31 PM    Final    Results for orders placed or performed during the hospital encounter of 05/01/21  Resp Panel by RT-PCR (Flu A&B, Covid) Nasopharyngeal Swab     Status: None   Collection Time: 05/01/21  9:42 AM   Specimen: Nasopharyngeal Swab; Nasopharyngeal(NP) swabs in vial transport medium  Result Value Ref Range Status   SARS Coronavirus 2 by RT PCR NEGATIVE NEGATIVE Final    Comment: (NOTE) SARS-CoV-2 target nucleic acids are NOT DETECTED.  The SARS-CoV-2 RNA is generally detectable in upper respiratory specimens during the acute phase of infection. The lowest concentration of SARS-CoV-2 viral copies this assay can detect is 138 copies/mL. A negative result does not preclude SARS-Cov-2 infection and should not be used as the sole basis for treatment or other patient management decisions. A negative result may occur with  improper specimen collection/handling, submission of specimen other than nasopharyngeal swab, presence of viral mutation(s) within the areas targeted by this assay, and inadequate number of viral copies(<138 copies/mL). A negative result must be combined with clinical observations, patient  history, and epidemiological information. The expected result is Negative.  Fact Sheet for Patients:  EntrepreneurPulse.com.au  Fact Sheet for Healthcare Providers:  IncredibleEmployment.be  This test is no t yet approved or cleared by the [place] FDA and  has been authorized for detection and/or diagnosis of SARS-CoV-2 by FDA under an Emergency Use Authorization (EUA). This EUA will remain  in effect (meaning this test can be used) for the duration of the COVID-19 declaration under Section 564(b)(1) of the Act, 21 U.S.C.section 360bbb-3(b)(1), unless the authorization is terminated  or revoked sooner.       Influenza A by PCR NEGATIVE NEGATIVE Final   Influenza B by PCR NEGATIVE NEGATIVE Final    Comment: (NOTE) The Xpert Xpress SARS-CoV-2/FLU/RSV plus assay is intended as an aid in the diagnosis of influenza from Nasopharyngeal swab specimens and should not be used as a sole basis for treatment. Nasal washings and aspirates are unacceptable for Xpert Xpress SARS-CoV-2/FLU/RSV testing.  Fact Sheet for Patients: EntrepreneurPulse.com.au  Fact Sheet for Healthcare Providers: IncredibleEmployment.be  This test is not yet approved or cleared by the [place] FDA and has been authorized for detection and/or diagnosis of SARS-CoV-2 by FDA under an Emergency Use Authorization (EUA). This EUA will remain in effect (meaning this test can be used) for the duration of the COVID-19 declaration under Section 564(b)(1) of the Act, 21 U.S.C. section 360bbb-3(b)(1), unless the authorization is terminated or revoked.  Performed at [facility], [address] [place], [place] [id number]     Signed:  Ezekiel Slocumb MD.  [facility] 05/05/2021, 1:56 PM

## 2021-05-05 NOTE — TOC Progression Note (Addendum)
Transition of Care [facility]) - Progression Note    Patient Details  Name: CALEE NUGENT MRN: [id number] Date of Birth: [birth year]  Transition of Care [facility]) CM/SW Contact  Eileen Stanford, LCSW Phone Number: 05/05/2021, 3:36 PM  Clinical Narrative:   New 02, 02 will be delivered to room via [employer]. CSW spoke with pt and she has had [place] in the past by [facility] and she said she loved them. CSW sent referral to [place] and they will service pt.          Expected Discharge Plan and Services           Expected Discharge Date: 05/05/21                                     Social Determinants of Health (SDOH) Interventions    Readmission Risk Interventions No flowsheet data found.

## 2021-05-05 NOTE — Telephone Encounter (Signed)
  Encourage patient to contact the pharmacy for refills or they can request refills through [facility]:  Please schedule appointment if longer than 1 year  NEXT APPOINTMENT DATE:  MEDICATION:levETIRAcetam (KEPPRA) tablet 1,000 mg   Is the patient out of medication?   PHARMACY:[id number] [place], [place] - [address]   Let patient know to contact pharmacy at the end of the day to make sure medication is ready.  Please notify patient to allow 48-72 hours to process  CLINICAL FILLS OUT ALL BELOW:   LAST REFILL:  QTY:  REFILL DATE:    OTHER COMMENTS:    Okay for refill?  Please advise

## 2021-05-05 NOTE — Care Management Important Message (Signed)
Important Message  Patient Details  Name: BREELLE HOLLYWOOD MRN: [id number] Date of Birth: [birth year]   Medicare Important Message Given:  Yes     Dannette Barbara 05/05/2021, 3:01 PM

## 2021-05-05 NOTE — Progress Notes (Signed)
[facility]  ROUNDING NOTE   Subjective:   Carrie [name] is a 70 year old female with past medical history including anemia, end-stage renal disease on dialysis, depression, diabetes, and hypertension.  She reports to the emergency room with complaints of shortness of breath that began this morning when she awoke.  Patient was scheduled for dialysis today at outpatient clinic, but states her shortness of breath was too bad she called EMS.  Patient is known to our office and receives outpatient dialysis treatments at [facility] on a MWF schedule, supervised by Dr. Juleen China.    Patient seen and evaluated during dialysis.   HEMODIALYSIS FLOWSHEET:  Blood Flow Rate (mL/min): 400 mL/min Arterial Pressure (mmHg): -180 mmHg Venous Pressure (mmHg): 170 mmHg Transmembrane Pressure (mmHg): 60 mmHg Ultrafiltration Rate (mL/min): 670 mL/min Dialysate Flow Rate (mL/min): 500 ml/min Conductivity: Machine : 13.8 Conductivity: Machine : 13.8 Dialysis Fluid Bolus: Normal Saline Bolus Amount (mL): 250 mL  Was weaned to room air for short period but began having shortness of breath. Was placed back on 2 L of oxygen  Objective:  Vital signs in last 24 hours:  Temp:  [97.7 F (36.5 C)-98.1 F (36.7 C)] 98.1 F (36.7 C) (12/02 1235) Pulse Rate:  [63-69] 69 (12/02 1245) Resp:  [12-18] 15 (12/02 1245) BP: (102-132)/(58-71) 116/65 (12/02 1245) SpO2:  [90 %-100 %] 100 % (12/02 1245) Weight:  [100.8 kg-103.3 kg] 100.8 kg (12/02 1235)  Weight change:  Filed Weights   05/05/21 0800 05/05/21 0916 05/05/21 1235  Weight: 103.3 kg 102.8 kg 100.8 kg    Intake/Output: I/O last 3 completed shifts: In: 480 [P.O.:480] Out: -    Intake/Output this shift:  Total I/O In: -  Out: 1500 [Other:1500]  Physical Exam: General: NAD, resting comfortably  Head: Normocephalic, atraumatic. Moist oral mucosal membranes  Eyes: Anicteric  Lungs:  Clear to auscultation, normal effort, O2 2L  [place]  Heart: Regular rate and rhythm  Abdomen:  Soft, nontender, nondistended  Extremities: Trace peripheral edema.  Neurologic: Nonfocal, moving all four extremities  Skin: No lesions  Access: Left upper extremity AVG    Basic Metabolic Panel: Recent Labs  Lab 05/01/21 1043 05/02/21 0524 05/04/21 0534 05/05/21 0458  NA 139 134* 134* 134*  K 4.3 3.5 3.9 3.9  CL 102 96* 95* 95*  CO2 28 32 30 29  GLUCOSE 109* 98 101* 87  BUN 49* 28* 36* 56*  CREATININE 10.22* 6.61* 6.90* 8.77*  CALCIUM 8.4* 8.2* 8.3* 8.3*     Liver Function Tests: Recent Labs  Lab 05/01/21 1043 05/02/21 0524  AST 20 14*  ALT 14 12  ALKPHOS 102 95  BILITOT 0.7 0.7  PROT 7.9 7.3  ALBUMIN 4.0 3.6    No results for input(s): LIPASE, AMYLASE in the last 168 hours. No results for input(s): AMMONIA in the last 168 hours.  CBC: Recent Labs  Lab 05/01/21 1043 05/02/21 0524 05/04/21 0534  WBC 6.8 5.5 4.2  NEUTROABS 5.0  --   --   HGB 9.8* 9.7* 10.0*  HCT 31.2* 30.9* 30.8*  MCV 97.5 96.9 94.8  PLT 194 170 176     Cardiac Enzymes: No results for input(s): CKTOTAL, CKMB, CKMBINDEX, TROPONINI in the last 168 hours.  BNP: Invalid input(s): POCBNP  CBG: No results for input(s): GLUCAP in the last 168 hours.  Microbiology: Results for orders placed or performed during the hospital encounter of 05/01/21  Resp Panel by RT-PCR (Flu A&B, Covid) Nasopharyngeal Swab  Status: None   Collection Time: 05/01/21  9:42 AM   Specimen: Nasopharyngeal Swab; Nasopharyngeal(NP) swabs in vial transport medium  Result Value Ref Range Status   SARS Coronavirus 2 by RT PCR NEGATIVE NEGATIVE Final    Comment: (NOTE) SARS-CoV-2 target nucleic acids are NOT DETECTED.  The SARS-CoV-2 RNA is generally detectable in upper respiratory specimens during the acute phase of infection. The lowest concentration of SARS-CoV-2 viral copies this assay can detect is 138 copies/mL. A negative result does not preclude  SARS-Cov-2 infection and should not be used as the sole basis for treatment or other patient management decisions. A negative result may occur with  improper specimen collection/handling, submission of specimen other than nasopharyngeal swab, presence of viral mutation(s) within the areas targeted by this assay, and inadequate number of viral copies(<138 copies/mL). A negative result must be combined with clinical observations, patient history, and epidemiological information. The expected result is Negative.  Fact Sheet for Patients:  EntrepreneurPulse.com.au  Fact Sheet for Healthcare Providers:  IncredibleEmployment.be  This test is no t yet approved or cleared by the [place] FDA and  has been authorized for detection and/or diagnosis of SARS-CoV-2 by FDA under an Emergency Use Authorization (EUA). This EUA will remain  in effect (meaning this test can be used) for the duration of the COVID-19 declaration under Section 564(b)(1) of the Act, 21 U.S.C.section 360bbb-3(b)(1), unless the authorization is terminated  or revoked sooner.       Influenza A by PCR NEGATIVE NEGATIVE Final   Influenza B by PCR NEGATIVE NEGATIVE Final    Comment: (NOTE) The Xpert Xpress SARS-CoV-2/FLU/RSV plus assay is intended as an aid in the diagnosis of influenza from Nasopharyngeal swab specimens and should not be used as a sole basis for treatment. Nasal washings and aspirates are unacceptable for Xpert Xpress SARS-CoV-2/FLU/RSV testing.  Fact Sheet for Patients: EntrepreneurPulse.com.au  Fact Sheet for Healthcare Providers: IncredibleEmployment.be  This test is not yet approved or cleared by the [place] FDA and has been authorized for detection and/or diagnosis of SARS-CoV-2 by FDA under an Emergency Use Authorization (EUA). This EUA will remain in effect (meaning this test can be used) for the duration of  the COVID-19 declaration under Section 564(b)(1) of the Act, 21 U.S.C. section 360bbb-3(b)(1), unless the authorization is terminated or revoked.  Performed at [facility], [address] [place], [place] [address]     Coagulation Studies: No results for input(s): LABPROT, INR in the last 72 hours.   Urinalysis: No results for input(s): COLORURINE, LABSPEC, PHURINE, GLUCOSEU, HGBUR, BILIRUBINUR, KETONESUR, PROTEINUR, UROBILINOGEN, NITRITE, LEUKOCYTESUR in the last 72 hours.  Invalid input(s): APPERANCEUR    Imaging: No results found.   Medications:      (feeding supplement) PROSource Plus  30 mL Oral BID BM   amLODipine  10 mg Oral Daily   aspirin EC  81 mg Oral Daily   atorvastatin  40 mg Oral Daily   Chlorhexidine Gluconate Cloth  6 each Topical Q0600   heparin  5,000 Units Subcutaneous Q12H   levETIRAcetam  1,000 mg Oral BID   losartan  25 mg Oral Once per day on Sun Tue Thu Sat   And   losartan  50 mg Oral Once per day on Mon Wed Fri   metoprolol succinate  25 mg Oral QPM   multivitamin  1 tablet Oral QHS   pantoprazole  40 mg Oral Daily   sevelamer carbonate  1,600 mg Oral TID WC  sodium chloride flush  3 mL Intravenous Q12H   acetaminophen **OR** acetaminophen, acetaminophen, albuterol, bisacodyl, guaiFENesin-dextromethorphan, hydrALAZINE, LORazepam, morphine injection, ondansetron **OR** ondansetron (ZOFRAN) IV, sevelamer carbonate  Assessment/ Plan:  Carrie [name] is a 70 y.o.  female with past medical history including anemia, end-stage renal disease on dialysis, depression, diabetes, and hypertension.  She reports to the emergency room with complaints of shortness of breath that began this morning when she awoke.    Shortness of breath due to pulmonary edema seen on chest x-ray.  Patient failed weaning trial, and was placed back on 2 L oxygen.  End-stage renal disease on dialysis.  Receiving dialysis today to maintain outpatient  schedule.  UF goal 1 L as tolerated.  Next treatment scheduled for Monday.  3. Anemia of chronic kidney disease Lab Results  Component Value Date   HGB 10.0 (L) 05/04/2021  Hemoglobin remains at acceptable target.    4. Secondary Hyperparathyroidism:  Lab Results  Component Value Date   CALCIUM 8.3 (L) 05/05/2021   PHOS 4.6 08/06/2018   We will continue to monitor bone minerals during this admission.  Continue sevelamer with meals  5.  Hypertension with chronic kidney disease.  Home regimen includes amlodipine, losartan, metoprolol, and torsemide.  BP 116/73   LOS: 4 Shandiin Eisenbeis 12/2/20221:02 PM

## 2021-05-05 NOTE — Progress Notes (Signed)
Mobility Specialist - Progress Note   05/05/21 1500  Mobility  Activity Ambulated to bathroom  Level of Assistance Standby assist, set-up cues, supervision of patient - no hands on  Assistive Device None  Distance Ambulated (ft) 50 ft  Mobility Out of bed for toileting;Ambulated with assistance in room  Mobility Response Tolerated well  Mobility performed by Mobility specialist  $Mobility charge 1 Mobility    Pt sitting in recliner upon arrival, utilizing 2L. Ambulated to bathroom with supervision. O2 maintained high 90s. Independent in peri-care. Pt returned to recliner, family at bedside.    Kathee Delton Mobility Specialist 05/05/21, 3:30 PM

## 2021-05-05 NOTE — Telephone Encounter (Signed)
Rx sent electronically.  

## 2021-05-08 ENCOUNTER — Telehealth: Payer: Self-pay | Admitting: Internal Medicine

## 2021-05-08 DIAGNOSIS — N186 End stage renal disease: Secondary | ICD-10-CM | POA: Diagnosis not present

## 2021-05-08 DIAGNOSIS — N2581 Secondary hyperparathyroidism of renal origin: Secondary | ICD-10-CM | POA: Diagnosis not present

## 2021-05-08 DIAGNOSIS — Z992 Dependence on renal dialysis: Secondary | ICD-10-CM | POA: Diagnosis not present

## 2021-05-08 MED ORDER — HYDROCODONE BIT-HOMATROP MBR 5-1.5 MG/5ML PO SOLN: 5.0000 mL | Freq: Every evening | ORAL | 0 refills | Status: DC | PRN

## 2021-05-08 NOTE — Telephone Encounter (Signed)
See note below access nurse note. Sending to Dr Silvio Pate and Larene Beach CMA.

## 2021-05-08 NOTE — Telephone Encounter (Signed)
[name] - Client TELEPHONE ADVICE RECORD AccessNurse Patient Name: Carrie [name] Gender: Female DOB: [birth year] Age: 70 Y 1 M 24 D Return Phone Number: [phone] (Primary), [phone] (Secondary) Address: City/ State/ Zip: [place]  [address] Client Carrie [name] - Client Client Site [name] Provider Viviana Simpler- MD Contact Type Call Who Is Calling Patient / Member / Family / Caregiver Call Type Triage / Clinical Relationship To Patient Self Return Phone Number [phone] (Primary) Chief Complaint Cough Reason for Call Symptomatic / Request for Carrie [name] states she was discharged from the hospital yesterday (05/05/21) after her 02 levels dropped and is experiencing a constant cough which is bothering her. She states she was prescribed a cough medicine in the past which gave her relief, but does not recall the name. Translation No Nurse Assessment Nurse: Glean Salvo, RN, [place] Date/Time (Eastern Time): 05/06/2021 11:54:03 AM Confirm and document reason for call. If symptomatic, describe symptoms. ---Caller states she was discharged from the hospital yesterday (05/05/21) after her 02 levels dropped and is experiencing a constant cough which is bothering her. She states she was prescribed a cough medicine in the past which gave her relief, but does not recall the name. In the hospital she given Robitussin but its not helping. Denies fever Does the patient have any new or worsening symptoms? ---Yes Will a triage be completed? ---Yes Related visit to physician within the last 2 weeks? ---Yes Does the PT have any chronic conditions? (i.e. diabetes, asthma, this includes High risk factors for pregnancy, etc.) ---Yes List chronic conditions. ---Dialysis Is this a behavioral health or substance abuse call?  ---No Guidelines Guideline Title Affirmed Question Affirmed Notes Nurse Date/Time Eilene Ghazi Time) Cough - Chronic [1] MILD difficulty breathing (e.g., minimal/no SOB Walker-Foster, RN, [place] 05/06/2021 11:55:57 AM PLEASE NOTE: All timestamps contained within this report are represented as [place] Standard Time. CONFIDENTIALTY NOTICE: This fax transmission is intended only for the addressee. It contains information that is legally privileged, confidential or otherwise protected from use or disclosure. If you are not the intended recipient, you are strictly prohibited from reviewing, disclosing, copying using or disseminating any of this information or taking any action in reliance on or regarding this information. If you have received this fax in error, please notify us immediately by telephone so that we can arrange for its return to [place]. Phone: [phone], Toll-Free: [phone], Fax: [phone] Page: 2 of 2 Call Id: [id number] Guidelines Guideline Title Affirmed Question Affirmed Notes Nurse Date/Time Eilene Ghazi Time) at rest, SOB with walking, pulse <100) AND [2] still present when not coughing (Exception: no change from usual, chronic shortness of breath) Disp. Time Eilene Ghazi Time) Disposition Final User 05/06/2021 11:58:44 AM See HCP within 4 Hours (or PCP triage) Yes Walker-Foster, RN, Hayden Rasmussen Caller Disagree/Comply Comply Caller Understands Yes PreDisposition [place] Advice Given Per Guideline SEE HCP (OR PCP TRIAGE) WITHIN 4 HOURS: CALL BACK IF: * You become worse CARE ADVICE given per Cough - Chronic (Adult) guideline. Comments User: Ellison Carwin, RN Date/Time Eilene Ghazi Time): 05/06/2021 11:55:53 AM Cough is chronic Referrals GO TO FACILITY UNDECIDE

## 2021-05-08 NOTE — Telephone Encounter (Signed)
Please let her know that I sent that strong cough syrup for her again

## 2021-05-08 NOTE — Telephone Encounter (Signed)
Home Health verbal orders Caller Name: kelly carter Agency Name: [facility] number: [phone]  Requesting OT/PT/Skilled nursing/Social Work/Speech: pt  Reason: gait belt, balance  Frequency: 2w5 and 1w4  Please forward to [facility] pool or providers CMA

## 2021-05-08 NOTE — Telephone Encounter (Signed)
Spoke to pt. She was very Patent attorney for the Hycodan.

## 2021-05-08 NOTE — Telephone Encounter (Signed)
Pt called stating that she was at the ER on 05/01/21. Pt stated that they gave her something for cough. Pt states that its not working. Pt is asking if you would call her in a prescription for HYDROcodone-homatropine (HYCODAN) 5-1.5 MG/5ML syrup at  [id number] [place] - [address]

## 2021-05-09 ENCOUNTER — Telehealth: Payer: Self-pay

## 2021-05-09 NOTE — Telephone Encounter (Signed)
Left detailed message on verified VM for [facility].

## 2021-05-09 NOTE — Telephone Encounter (Signed)
Transition Care Management Follow-up Telephone Call Date of discharge and from where: [facility]-05/05/2021 How have you been since you were released from the hospital? Doing pretty good. Had to use oxygen over the weekend but none today. Any questions or concerns? No  Items Reviewed: Did the pt receive and understand the discharge instructions provided? Yes  Medications obtained and verified? Yes  Other? Yes  Any new allergies since your discharge? No  Dietary orders reviewed? Yes Do you have support at home? Yes   Home Care and Equipment/Supplies: Were home health services ordered? yes If so, what is the name of the agency? [facility]  Has the agency set up a time to come to the patient's home? yes Were any new equipment or medical supplies ordered?  Yes: oxygen What is the name of the medical supply agency? [employer] Were you able to get the supplies/equipment? yes Do you have any questions related to the use of the equipment or supplies? No  Functional Questionnaire: (I = Independent and D = Dependent) ADLs: I  Bathing/Dressing- I  Meal Prep- I  Eating- I  Maintaining continence- I  Transferring/Ambulation- I  Managing Meds- I  Follow up appointments reviewed:  PCP Hospital f/u appt confirmed? Yes  Scheduled to see Dr. Silvio Pate on 05/17/2021 @ 9:30. [facility] f/u appt confirmed?  N/a   Are transportation arrangements needed? No  If their condition worsens, is the pt aware to call PCP or go to the Emergency Dept.? Yes Was the patient provided with contact information for the PCP's office or ED? Yes Was to pt encouraged to call back with questions or concerns? Yes

## 2021-05-10 DIAGNOSIS — N2581 Secondary hyperparathyroidism of renal origin: Secondary | ICD-10-CM | POA: Diagnosis not present

## 2021-05-10 DIAGNOSIS — N186 End stage renal disease: Secondary | ICD-10-CM | POA: Diagnosis not present

## 2021-05-10 DIAGNOSIS — Z992 Dependence on renal dialysis: Secondary | ICD-10-CM | POA: Diagnosis not present

## 2021-05-12 DIAGNOSIS — Z992 Dependence on renal dialysis: Secondary | ICD-10-CM | POA: Diagnosis not present

## 2021-05-12 DIAGNOSIS — N2581 Secondary hyperparathyroidism of renal origin: Secondary | ICD-10-CM | POA: Diagnosis not present

## 2021-05-12 DIAGNOSIS — N186 End stage renal disease: Secondary | ICD-10-CM | POA: Diagnosis not present

## 2021-05-15 ENCOUNTER — Ambulatory Visit: Payer: Medicare HMO

## 2021-05-15 DIAGNOSIS — I10 Essential (primary) hypertension: Secondary | ICD-10-CM

## 2021-05-15 DIAGNOSIS — N186 End stage renal disease: Secondary | ICD-10-CM | POA: Diagnosis not present

## 2021-05-15 DIAGNOSIS — I5032 Chronic diastolic (congestive) heart failure: Secondary | ICD-10-CM

## 2021-05-15 DIAGNOSIS — Z992 Dependence on renal dialysis: Secondary | ICD-10-CM | POA: Diagnosis not present

## 2021-05-15 DIAGNOSIS — N2581 Secondary hyperparathyroidism of renal origin: Secondary | ICD-10-CM | POA: Diagnosis not present

## 2021-05-15 DIAGNOSIS — G8929 Other chronic pain: Secondary | ICD-10-CM

## 2021-05-15 NOTE — Patient Instructions (Signed)
Visit Information  Thank you for taking time to visit with me today. Please don't hesitate to contact me if I can be of assistance to you before our next scheduled telephone appointment.  Following are the goals we discussed today:  (Copy and paste patient goals from clinical care plan here)  Our next appointment is by telephone on July 27, 2021 at 1:00 pm  Please call the care guide team at [phone] if you need to cancel or reschedule your appointment.   If you are experiencing a Mental Health or [place] or need someone to talk to, please call the Suicide and Crisis Lifeline: 988 call the [place] National Suicide Prevention Lifeline: 414-532-6107 or TTY: 858-178-2860 TTY 424-413-9989) to talk to a trained counselor call 1-800-273-TALK (toll free, 24 hour hotline)   Patient verbalizes understanding of instructions provided today and agrees to view in [place].   Quinn Plowman RN,BSN,CCM RN Case Manager [place]  [phone]

## 2021-05-15 NOTE — Chronic Care Management (AMB) (Signed)
Chronic Care Management   CCM RN Visit Note  05/15/2021 Name: Carrie [name] MRN: [id number] DOB: [birth year]  Subjective: Carrie [name] is a 70 y.o. year old female who is a primary care patient of Venia Carbon, MD. The care management team was consulted for assistance with disease management and care coordination needs.    Engaged with patient by telephone for follow up visit in response to provider referral for case management and/or care coordination services.   Consent to Services:  The patient was given information about Chronic Care Management services, agreed to services, and gave verbal consent prior to initiation of services.  Please see initial visit note for detailed documentation.   Patient agreed to services and verbal consent obtained.   Assessment: Review of patient past medical history, allergies, medications, health status, including review of consultants reports, laboratory and other test data, was performed as part of comprehensive evaluation and provision of chronic care management services.   SDOH (Social Determinants of Health) assessments and interventions performed:    CCM Care Plan  Allergies  Allergen Reactions   Naproxen Sodium Anaphylaxis and Swelling   Sulfamethoxazole-Trimethoprim Anaphylaxis and Swelling    Outpatient Encounter Medications as of 05/15/2021  Medication Sig   acetaminophen (TYLENOL) 500 MG tablet Take 1,000 mg by mouth every 6 (six) hours as needed for mild pain or moderate pain.   albuterol (VENTOLIN HFA) 108 (90 Base) MCG/ACT inhaler Inhale 2 puffs into the lungs every 6 (six) hours as needed for wheezing or shortness of breath.   amLODipine (NORVASC) 10 MG tablet Take 10 mg by mouth daily.   aspirin EC 81 MG tablet Take 81 mg by mouth daily.   atorvastatin (LIPITOR) 40 MG tablet TAKE 40 MG BY MOUTH ONCE DAILY (Patient taking differently: Take 40 mg by mouth daily.)   guaiFENesin-dextromethorphan (ROBITUSSIN DM) 100-10  MG/5ML syrup Take 5 mLs by mouth every 4 (four) hours as needed for cough (chest congestion).   HYDROcodone bit-homatropine (HYCODAN) 5-1.5 MG/5ML syrup Take 5 mLs by mouth at bedtime as needed for cough.   lidocaine-prilocaine (EMLA) cream Apply 1 application topically every Monday, Wednesday, and Friday with hemodialysis.   LORazepam (ATIVAN) 0.5 MG tablet Take 1 tablet (0.5 mg total) by mouth 2 (two) times daily as needed for anxiety.   losartan (COZAAR) 25 MG tablet Take 25-50 mg by mouth See admin instructions. Take 1 tablet (42m) by mouth daily on Tuesday, Thursday, Saturday and Sunday and take 2 tablets (585m by mouth daily on Monday, Wednesday and Friday before dialysis   metoprolol succinate (TOPROL-XL) 25 MG 24 hr tablet Take 25 mg by mouth every evening.   multivitamin (RENA-VIT) TABS tablet Take 1 tablet by mouth at bedtime.   Nutritional Supplements (,FEEDING SUPPLEMENT, PROSOURCE PLUS) liquid Take 30 mLs by mouth 2 (two) times daily between meals.   omeprazole (PRILOSEC) 20 MG capsule Take 1 capsule (20 mg total) by mouth daily.   sevelamer carbonate (RENVELA) 800 MG tablet Take 800-1,600 mg by mouth See admin instructions. Take 2 tablets (1600 mg) by mouth three times daily with meals and take 1 tablet (800 mg) by mouth twice daily with snacks   torsemide (DEMADEX) 20 MG tablet Take 20 mg by mouth 4 (four) times a week. (Morning of non-dialysis days)   No facility-administered encounter medications on file as of 05/15/2021.    Patient Active Problem List   Diagnosis Date Noted   [facility]   Hyperlipidemia  SOB (shortness of breath) 05/01/2021   Anemia 05/01/2021   Acute respiratory failure with hypoxia ([place]) 05/01/2021   Positive FIT (fecal immunochemical test)    Polyp of ascending colon    Polyp of descending colon    Polyp of transverse colon    Dysphagia 02/07/2021   Tremor 12/01/2020   Headache 12/01/2020   Eustachian tube dysfunction 10/25/2020    History of shingles 09/26/2020   Primary osteoarthritis of left knee 02/63/7858   Complication from renal dialysis device 11/20/2018   ESRD on hemodialysis ([place]) 08/04/2018   Acute on chronic diastolic CHF (congestive heart failure) ([place]) 11/20/2017   Cough 10/01/2017   Status post total right knee replacement 04/30/2017   Unilateral primary osteoarthritis, right knee 03/18/2017   History of total hip replacement, left 03/18/2017   Unilateral primary osteoarthritis, left hip 03/27/2016   Status post total replacement of left hip 03/27/2016   Mood disorder ([place]) 04/01/2015   Osteoarthritis of right knee 02/03/2014   Advanced directives, counseling/discussion 02/03/2014   Routine general medical examination at a health care facility 07/08/2012   Obesity, Class III, BMI 40-49.9 (morbid obesity) ([place]) 07/08/2012   Type 2 diabetes, controlled, with renal manifestation ([place])    HYPERCHOLESTEROLEMIA 09/26/2006   Essential hypertension 09/26/2006   ALLERGIC RHINITIS 09/26/2006   History of meningioma 08/03/2006    Conditions to be addressed/monitored:CHF, HTN, and ESRD  Care Plan : RN Case Manager Plan of care  Updates made by Dannielle Karvonen, RN since 05/15/2021 12:00 AM     Problem: Knowledge deficit related to long term care plan for management of chronic conditions ( HTN, HF, ESRD)   Priority: High     Long-Range Goal: Chronic conditions monitored and managed ( HTN, HF, ESRD   Start Date: 04/11/2021  Expected End Date: 08/01/2021  Priority: High  Note:   Current Barriers:  Knowledge Deficits related to plan of care for management of CHF, HTN, and ESRD  Chronic Disease Management support and education needs related to CHF, HTN, and ESRD Patient states she was recently admitted to the hospital from 05/02/2021 to 12/ 2/ 2022 due to shortness of breath. Patient reports she was discharged with home O2 at 1-2 L that she uses as needed.  She reports [employer] has delivered her oxygen   concentrator and tanks.  Patient states is receiving home heath physical therapy services 2 times per week. Patient states she's had a cough periodically that her primary care provider has prescribed cough medication for.  Patient reports having follow up appointment scheduled with PCP for 05/17/2021.  Patient states she continues to take only tylenol for knee pain when needed.   She states she has checked her blood pressure periodically but did not record due to everything that's been going on. Patient states she will start back recording her blood pressures readings.   RNCM Clinical Goal(s):  Patient will verbalize basic understanding of CHF, HTN, and ESRD disease process and self health management plan as evidenced by weighing at least 3 times per week, taking medications as prescribed, monitoring blood pressure at least 2-3 days per week and recording.  take all medications exactly as prescribed and will call provider for medication related questions as evidenced by      attend all scheduled medical appointments:   as evidenced by patient reporting they have attended appointments.         continue to work with Consulting civil engineer and/or Social Worker to address care management and care  coordination needs related to CHF, HTN, and ESRD as evidenced by adherence to CM Team Scheduled appointments     through collaboration with RN Care manager, provider, and care team.   Interventions: 1:1 collaboration with primary care provider regarding development and update of comprehensive plan of care as evidenced by provider attestation and co-signature Inter-disciplinary care team collaboration (see longitudinal plan of care) Evaluation of current treatment plan related to  self management and patient's adherence to plan as established by provider  Hypertension Interventions: Goal on track Last practice recorded BP readings:  BP Readings from Last 3 Encounters:  05/05/21 101/62  04/13/21 (!) 202/82  03/07/21  (!) 145/77  Most recent eGFR/CrCl: No results found for: EGFR  No components found for: CRCL  Evaluation of current treatment plan related to hypertension self management and patient's adherence to plan as established by provider; Provided education to patient re: stroke prevention, s/s of heart attack and stroke; Reviewed medications with patient and discussed importance of compliance; Discussed plans with patient for ongoing care management follow up and provided patient with direct contact information for care management team; Reviewed scheduled/upcoming provider appointments including:  Evaluation of current treatment plan related to CHF, HTN, and ESRD,  self-management and patient's adherence to plan as established by provider. Discussed plans with patient for ongoing care management follow up and provided patient with direct contact information for care management team Advised to continue to follow low salt diet.  Pain Interventions: Pain assessment performed Medications reviewed Discussed importance of adherence to all scheduled medical appointments; Counseled on the importance of reporting any/all new or changed pain symptoms or management strategies to pain management provider; Discussed use of relaxation techniques and/or diversional activities to assist with pain reduction (distraction, imagery, relaxation, massage, acupressure, TENS, heat, and cold application; Reviewed with patient prescribed pharmacological and nonpharmacological pain relief strategies;   ESRD Interventions:  Goal on track  Reviewed medications with patient and discussed  ; Reviewed scheduled/upcoming provider appointments including; Discussed plans with patient for ongoing care management follow up and provided patient with direct contact information for care management team;  Heart Failure Interventions: New Goal Provided education on low sodium diet; Reviewed Heart Failure Action Plan in depth and provided  written copy; Assessed need for readable accurate scales in home; Discussed importance of daily weight and advised patient to weigh and record daily; Reviewed role of diuretics in prevention of fluid overload and management of heart failure; Discussed the importance of keeping all appointments with provider;  Reviewed medications and discussed Advised to follow low salt diet and fluid restrictions as advised by provider.   Patient Goals/Self-Care Activities: Patient will self administer medications as prescribed as evidenced by self report/primary caregiver report  Patient will attend all scheduled provider appointments as evidenced by clinician review of documented attendance to scheduled appointments and patient/caregiver report Patient will call provider office for new concerns or questions as evidenced by review of documented incoming telephone call notes and patient report Patient will check blood pressure at least 3 times per week and record. write blood pressure results in a log or diary (take log to your appointments to share information with your provider) Continue to follow a low sodium/ DASH diet and adhere to fluid restriction as recommended by your doctor.   Patient will weigh at least 2-3 times per week and record.  Elevate leg when sitting due to knee pain.  Apply ice/ heat for pain management.        Plan:The patient  has been provided with contact information for the care management team and has been advised to call with any health related questions or concerns.  The care management team will reach out to the patient again over the next 3 months . Quinn Plowman RN,BSN,CCM RN Case Manager [place]  [phone]

## 2021-05-17 ENCOUNTER — Encounter: Payer: Self-pay | Admitting: Internal Medicine

## 2021-05-17 ENCOUNTER — Ambulatory Visit (INDEPENDENT_AMBULATORY_CARE_PROVIDER_SITE_OTHER): Payer: Medicare HMO | Admitting: Internal Medicine

## 2021-05-17 ENCOUNTER — Other Ambulatory Visit: Payer: Self-pay

## 2021-05-17 DIAGNOSIS — N2581 Secondary hyperparathyroidism of renal origin: Secondary | ICD-10-CM | POA: Diagnosis not present

## 2021-05-17 DIAGNOSIS — I5032 Chronic diastolic (congestive) heart failure: Secondary | ICD-10-CM | POA: Diagnosis not present

## 2021-05-17 DIAGNOSIS — J9611 Chronic respiratory failure with hypoxia: Secondary | ICD-10-CM | POA: Diagnosis not present

## 2021-05-17 DIAGNOSIS — N186 End stage renal disease: Secondary | ICD-10-CM | POA: Diagnosis not present

## 2021-05-17 DIAGNOSIS — I10 Essential (primary) hypertension: Secondary | ICD-10-CM | POA: Diagnosis not present

## 2021-05-17 DIAGNOSIS — Z992 Dependence on renal dialysis: Secondary | ICD-10-CM | POA: Diagnosis not present

## 2021-05-17 NOTE — Assessment & Plan Note (Signed)
EF was 55% on the echo without major valve issues Clearly is all fluid dependent Will need to monitor weight carefully---dialysis is the only option (though she takes torsemide on non dialysis days---I doubt she can get adequate diuresis with this)

## 2021-05-17 NOTE — Assessment & Plan Note (Signed)
Has oxygen at home now--but not needing much This may allow her to stay at home if mild hypoxia awaiting dialysis, etc

## 2021-05-17 NOTE — Assessment & Plan Note (Signed)
BP Readings from Last 3 Encounters:  05/17/21 140/90  05/05/21 101/62  04/13/21 (!) 202/82   Acceptable control on losartan and amlodipine

## 2021-05-17 NOTE — Progress Notes (Signed)
Subjective:    Patient ID: Carrie [name], female    DOB: 1951/01/01, 71 y.o.   MRN: [id number]  HPI Here for hospital follow up With sister Reviewed hospital records---including echo and nephrology notes. Hospitalist notes reviewed but discharge summary not done yet  Awoke on 11/28---"couldn't hardly breathe". This was a dialysis day EMS called--started oxygen and "a switch came on" Brought to ER Imaging showed pulmonary congestion and hypoxia Stayed 5 days---some trouble getting out of control Had dialysis while there  No changes in eating Avoids salt  Generally does 3.5 hours on dialysis They monitor weight before and after at dialysis Now is at her dry weight after dialysis  No SOB now Has oxygen but has only used it rarely No chest pain  Current Outpatient Medications on File Prior to Visit  Medication Sig Dispense Refill   acetaminophen (TYLENOL) 500 MG tablet Take 1,000 mg by mouth every 6 (six) hours as needed for mild pain or moderate pain.     albuterol (VENTOLIN HFA) 108 (90 Base) MCG/ACT inhaler Inhale 2 puffs into the lungs every 6 (six) hours as needed for wheezing or shortness of breath. 8 g 2   amLODipine (NORVASC) 10 MG tablet Take 10 mg by mouth daily.     aspirin EC 81 MG tablet Take 81 mg by mouth daily.     atorvastatin (LIPITOR) 40 MG tablet TAKE 40 MG BY MOUTH ONCE DAILY (Patient taking differently: Take 40 mg by mouth daily.) 90 tablet 3   guaiFENesin-dextromethorphan (ROBITUSSIN DM) 100-10 MG/5ML syrup Take 5 mLs by mouth every 4 (four) hours as needed for cough (chest congestion). 118 mL 0   HYDROcodone bit-homatropine (HYCODAN) 5-1.5 MG/5ML syrup Take 5 mLs by mouth at bedtime as needed for cough. 120 mL 0   lidocaine-prilocaine (EMLA) cream Apply 1 application topically every Monday, Wednesday, and Friday with hemodialysis.     LORazepam (ATIVAN) 0.5 MG tablet Take 1 tablet (0.5 mg total) by mouth 2 (two) times daily as needed for anxiety. 60  tablet 0   losartan (COZAAR) 25 MG tablet Take 25-50 mg by mouth See admin instructions. Take 1 tablet (25mg ) by mouth daily on Tuesday, Thursday, Saturday and Sunday and take 2 tablets (50mg ) by mouth daily on Monday, Wednesday and Friday before dialysis     metoprolol succinate (TOPROL-XL) 25 MG 24 hr tablet Take 25 mg by mouth every evening.     multivitamin (RENA-VIT) TABS tablet Take 1 tablet by mouth at bedtime. 30 tablet 0   omeprazole (PRILOSEC) 20 MG capsule Take 1 capsule (20 mg total) by mouth daily. 90 capsule 3   sevelamer carbonate (RENVELA) 800 MG tablet Take 800-1,600 mg by mouth See admin instructions. Take 2 tablets (1600 mg) by mouth three times daily with meals and take 1 tablet (800 mg) by mouth twice daily with snacks     torsemide (DEMADEX) 20 MG tablet Take 20 mg by mouth 4 (four) times a week. (Morning of non-dialysis days)     No current facility-administered medications on file prior to visit.    Allergies  Allergen Reactions   Naproxen Sodium Anaphylaxis and Swelling   Sulfamethoxazole-Trimethoprim Anaphylaxis and Swelling    Past Medical History:  Diagnosis Date   Allergy    Anemia    Arthritis    "right knee" (10/26'/2017)   CKD (chronic kidney disease), stage III ([place])    12/2015 (Dr. 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   Type II diabetes mellitus ([place]) 2011   on metformin until yr ago- no med now - off due to kidneys    Past Surgical History:  Procedure Laterality Date   A/V FISTULAGRAM Left 03/10/2019   Procedure: A/V FISTULAGRAM;  Surgeon: Katha Cabal, MD;  Location: [facility];  Service: Cardiovascular;  Laterality: Left;   A/V FISTULAGRAM Left 07/14/2019   Procedure: A/V FISTULAGRAM;  Surgeon: Katha Cabal, MD;  Location: [facility];  Service: Cardiovascular;   Laterality: Left;   A/V FISTULAGRAM Left 01/07/2020   Procedure: A/V FISTULAGRAM;  Surgeon: Katha Cabal, MD;  Location: [place];  Service: Cardiovascular;  Laterality: Left;   AV FISTULA PLACEMENT Left 11/07/2018   Procedure: ARTERIOVENOUS (AV) FISTULA CREATION ( BRACHIO BASILIC ) VS BRACHIAL AXILLARY GRAFT;  Surgeon: Katha Cabal, MD;  Location: [facility];  Service: Vascular;  Laterality: Left;   BRAIN MENINGIOMA EXCISION  08/2006   Foramina magnum meningioma   CERVICAL DISCECTOMY  1996   Dr. Alric Seton   CESAREAN SECTION  1981   COLONOSCOPY     COLONOSCOPY WITH PROPOFOL N/A 03/07/2021   Procedure: COLONOSCOPY WITH PROPOFOL;  Surgeon: Virgel Manifold, MD;  Location: [facility];  Service: Endoscopy;  Laterality: N/A;   DIALYSIS/PERMA CATHETER INSERTION N/A 08/04/2018   Procedure: DIALYSIS/PERMA CATHETER INSERTION;  Surgeon: Algernon Huxley, MD;  Location: [facility];  Service: Cardiovascular;  Laterality: N/A;   DIALYSIS/PERMA CATHETER REMOVAL N/A 12/25/2018   Procedure: DIALYSIS/PERMA CATHETER REMOVAL;  Surgeon: Algernon Huxley, MD;  Location: [facility];  Service: Cardiovascular;  Laterality: N/A;   DILATION AND CURETTAGE OF UTERUS     JOINT REPLACEMENT     right knee left hip   Right wrist x-ray  2007   Deg. at 1st MCP   TOTAL HIP ARTHROPLASTY Left 03/27/2016   Procedure: LEFT TOTAL HIP ARTHROPLASTY ANTERIOR APPROACH;  Surgeon: Mcarthur Rossetti, MD;  Location: [place] [facility];  Service: Orthopedics;  Laterality: Left;   TOTAL KNEE ARTHROPLASTY Right 04/30/2017   Procedure: RIGHT TOTAL KNEE ARTHROPLASTY;  Surgeon: Mcarthur Rossetti, MD;  Location: [facility];  Service: Orthopedics;  Laterality: Right;   TUBAL LIGATION  1981    Family History  Problem Relation Age of Onset   Hypertension Other        Strong family history    Breast cancer Mother 73   Heart failure Sister    Heart disease Sister     Social History   Socioeconomic History    Marital status: Widowed    Spouse name: Not on file   Number of children: 1   Years of education: Not on file   Highest education level: Not on file  Occupational History   Occupation: Formerly Psychologist, forensic, 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      No living will   Requests  niece, [name], as health care POA   Would accept resuscitation attempts   No tube feeds if cognitively aware   Social Determinants of Health   Financial Resource Strain: Low Risk    Difficulty of Paying Living Expenses: Not hard at all  Food Insecurity: No Food Insecurity   Worried About Charity fundraiser in the Last Year: Never true   [place] in the Last Year: Never true  Transportation Needs: No Transportation Needs   Lack of Transportation (Medical): No   Lack of Transportation (Non-Medical): No  Physical Activity: Not on file  Stress: Not on file  Social Connections: Not on file  Intimate Partner Violence: Not on file   Review of Systems Eating okay again---still small quantities Sleeps in recliner---no PND No sig edema     Objective:   Physical Exam Constitutional:      Appearance: Normal appearance.  Cardiovascular:     Rate and Rhythm: Normal rate and regular rhythm.     Heart sounds: No murmur heard.   No gallop.  Pulmonary:     Effort: Pulmonary effort is normal.     Breath sounds: Normal breath sounds. No wheezing or rales.  Musculoskeletal:     Cervical back: Neck supple.     Right lower leg: No edema.     Left lower leg: No edema.  Lymphadenopathy:     Cervical: No cervical adenopathy.  Neurological:      Mental Status: She is alert.           Assessment & Plan:

## 2021-05-19 DIAGNOSIS — N2581 Secondary hyperparathyroidism of renal origin: Secondary | ICD-10-CM | POA: Diagnosis not present

## 2021-05-19 DIAGNOSIS — N186 End stage renal disease: Secondary | ICD-10-CM | POA: Diagnosis not present

## 2021-05-19 DIAGNOSIS — Z992 Dependence on renal dialysis: Secondary | ICD-10-CM | POA: Diagnosis not present

## 2021-05-22 DIAGNOSIS — N186 End stage renal disease: Secondary | ICD-10-CM | POA: Diagnosis not present

## 2021-05-22 DIAGNOSIS — Z992 Dependence on renal dialysis: Secondary | ICD-10-CM | POA: Diagnosis not present

## 2021-05-22 DIAGNOSIS — N2581 Secondary hyperparathyroidism of renal origin: Secondary | ICD-10-CM | POA: Diagnosis not present

## 2021-05-23 ENCOUNTER — Telehealth: Payer: Medicare HMO

## 2021-05-24 DIAGNOSIS — N2581 Secondary hyperparathyroidism of renal origin: Secondary | ICD-10-CM | POA: Diagnosis not present

## 2021-05-24 DIAGNOSIS — Z992 Dependence on renal dialysis: Secondary | ICD-10-CM | POA: Diagnosis not present

## 2021-05-24 DIAGNOSIS — N186 End stage renal disease: Secondary | ICD-10-CM | POA: Diagnosis not present

## 2021-05-25 ENCOUNTER — Telehealth: Payer: Self-pay | Admitting: Internal Medicine

## 2021-05-25 NOTE — Telephone Encounter (Signed)
Spoke to Wayne with verbal orders.

## 2021-05-25 NOTE — Telephone Encounter (Signed)
Home Health verbal orders [place] Name:Connie Agency Name: [facility] number: [phone]  Requesting OT/PT/Skilled nursing/Social Work/Speech:  Reason:OT  Frequency:1 wk for 7 wks  Please forward to [facility] pool or providers CMA

## 2021-05-25 NOTE — Telephone Encounter (Signed)
error 

## 2021-05-26 DIAGNOSIS — Z992 Dependence on renal dialysis: Secondary | ICD-10-CM

## 2021-05-26 DIAGNOSIS — D631 Anemia in chronic kidney disease: Secondary | ICD-10-CM | POA: Diagnosis not present

## 2021-05-26 DIAGNOSIS — Z9981 Dependence on supplemental oxygen: Secondary | ICD-10-CM

## 2021-05-26 DIAGNOSIS — E669 Obesity, unspecified: Secondary | ICD-10-CM | POA: Diagnosis not present

## 2021-05-26 DIAGNOSIS — J9601 Acute respiratory failure with hypoxia: Secondary | ICD-10-CM | POA: Diagnosis not present

## 2021-05-26 DIAGNOSIS — I132 Hypertensive heart and chronic kidney disease with heart failure and with stage 5 chronic kidney disease, or end stage renal disease: Secondary | ICD-10-CM | POA: Diagnosis not present

## 2021-05-26 DIAGNOSIS — N2581 Secondary hyperparathyroidism of renal origin: Secondary | ICD-10-CM | POA: Diagnosis not present

## 2021-05-26 DIAGNOSIS — E1122 Type 2 diabetes mellitus with diabetic chronic kidney disease: Secondary | ICD-10-CM | POA: Diagnosis not present

## 2021-05-26 DIAGNOSIS — I5032 Chronic diastolic (congestive) heart failure: Secondary | ICD-10-CM | POA: Diagnosis not present

## 2021-05-26 DIAGNOSIS — F32A Depression, unspecified: Secondary | ICD-10-CM | POA: Diagnosis not present

## 2021-05-26 DIAGNOSIS — Z6841 Body Mass Index (BMI) 40.0 and over, adult: Secondary | ICD-10-CM | POA: Diagnosis not present

## 2021-05-26 DIAGNOSIS — N186 End stage renal disease: Secondary | ICD-10-CM | POA: Diagnosis not present

## 2021-05-29 DIAGNOSIS — Z992 Dependence on renal dialysis: Secondary | ICD-10-CM | POA: Diagnosis not present

## 2021-05-29 DIAGNOSIS — N2581 Secondary hyperparathyroidism of renal origin: Secondary | ICD-10-CM | POA: Diagnosis not present

## 2021-05-29 DIAGNOSIS — N186 End stage renal disease: Secondary | ICD-10-CM | POA: Diagnosis not present

## 2021-05-31 DIAGNOSIS — Z992 Dependence on renal dialysis: Secondary | ICD-10-CM | POA: Diagnosis not present

## 2021-05-31 DIAGNOSIS — N186 End stage renal disease: Secondary | ICD-10-CM | POA: Diagnosis not present

## 2021-05-31 DIAGNOSIS — N2581 Secondary hyperparathyroidism of renal origin: Secondary | ICD-10-CM | POA: Diagnosis not present

## 2021-06-02 DIAGNOSIS — N186 End stage renal disease: Secondary | ICD-10-CM | POA: Diagnosis not present

## 2021-06-02 DIAGNOSIS — Z992 Dependence on renal dialysis: Secondary | ICD-10-CM | POA: Diagnosis not present

## 2021-06-02 DIAGNOSIS — N2581 Secondary hyperparathyroidism of renal origin: Secondary | ICD-10-CM | POA: Diagnosis not present

## 2021-06-02 NOTE — Telephone Encounter (Signed)
error 

## 2021-06-03 DIAGNOSIS — Z992 Dependence on renal dialysis: Secondary | ICD-10-CM | POA: Diagnosis not present

## 2021-06-03 DIAGNOSIS — N186 End stage renal disease: Secondary | ICD-10-CM | POA: Diagnosis not present

## 2021-06-05 DIAGNOSIS — N2581 Secondary hyperparathyroidism of renal origin: Secondary | ICD-10-CM | POA: Diagnosis not present

## 2021-06-05 DIAGNOSIS — Z992 Dependence on renal dialysis: Secondary | ICD-10-CM | POA: Diagnosis not present

## 2021-06-05 DIAGNOSIS — N186 End stage renal disease: Secondary | ICD-10-CM | POA: Diagnosis not present

## 2021-06-06 DIAGNOSIS — F32A Depression, unspecified: Secondary | ICD-10-CM | POA: Diagnosis not present

## 2021-06-06 DIAGNOSIS — I5032 Chronic diastolic (congestive) heart failure: Secondary | ICD-10-CM | POA: Diagnosis not present

## 2021-06-06 DIAGNOSIS — D631 Anemia in chronic kidney disease: Secondary | ICD-10-CM | POA: Diagnosis not present

## 2021-06-06 DIAGNOSIS — J9601 Acute respiratory failure with hypoxia: Secondary | ICD-10-CM | POA: Diagnosis not present

## 2021-06-06 DIAGNOSIS — E669 Obesity, unspecified: Secondary | ICD-10-CM | POA: Diagnosis not present

## 2021-06-06 DIAGNOSIS — E1122 Type 2 diabetes mellitus with diabetic chronic kidney disease: Secondary | ICD-10-CM | POA: Diagnosis not present

## 2021-06-06 DIAGNOSIS — N186 End stage renal disease: Secondary | ICD-10-CM | POA: Diagnosis not present

## 2021-06-06 DIAGNOSIS — I132 Hypertensive heart and chronic kidney disease with heart failure and with stage 5 chronic kidney disease, or end stage renal disease: Secondary | ICD-10-CM | POA: Diagnosis not present

## 2021-06-06 DIAGNOSIS — Z6841 Body Mass Index (BMI) 40.0 and over, adult: Secondary | ICD-10-CM | POA: Diagnosis not present

## 2021-06-07 ENCOUNTER — Other Ambulatory Visit: Payer: Self-pay

## 2021-06-07 ENCOUNTER — Ambulatory Visit (INDEPENDENT_AMBULATORY_CARE_PROVIDER_SITE_OTHER): Payer: Medicare HMO | Admitting: Internal Medicine

## 2021-06-07 ENCOUNTER — Encounter: Payer: Self-pay | Admitting: Internal Medicine

## 2021-06-07 DIAGNOSIS — H60322 Hemorrhagic otitis externa, left ear: Secondary | ICD-10-CM | POA: Diagnosis not present

## 2021-06-07 DIAGNOSIS — Z992 Dependence on renal dialysis: Secondary | ICD-10-CM | POA: Diagnosis not present

## 2021-06-07 DIAGNOSIS — N186 End stage renal disease: Secondary | ICD-10-CM | POA: Diagnosis not present

## 2021-06-07 DIAGNOSIS — N2581 Secondary hyperparathyroidism of renal origin: Secondary | ICD-10-CM | POA: Diagnosis not present

## 2021-06-07 MED ORDER — NEOMYCIN-POLYMYXIN-HC 3.5-10000-1 OT SUSP: 4.0000 [drp] | Freq: Four times a day (QID) | OTIC | 1 refills | Status: DC

## 2021-06-07 MED ORDER — LORAZEPAM 0.5 MG PO TABS: 0.5000 mg | ORAL_TABLET | Freq: Two times a day (BID) | ORAL | 0 refills | Status: DC | PRN

## 2021-06-07 NOTE — Assessment & Plan Note (Signed)
No trauma Did have dripping blood--I can't see entire canal due to cerumen Will try cortisporin.  If ongoing pain or blood, will set up with ENT

## 2021-06-07 NOTE — Progress Notes (Signed)
Subjective:    Patient ID: Carrie [name], female    DOB: [birth year], 71 y.o.   MRN: [id number]  HPI Here with sister due to right ear pain with some bleeding  5 days ago--after being up a while, she noticed some blood (a clot of blood came out) Still hurts but no more bleeding No injury No Q-tips used  Not sick No cold symptoms (other than clear rhinorrhea) Still gets some coughing attacks----sister wonders if she could have ruptured her eardrum Still uses the hydrocodone syrup at times--still has a lot left  Current Outpatient Medications on File Prior to Visit  Medication Sig Dispense Refill   acetaminophen (TYLENOL) 500 MG tablet Take 1,000 mg by mouth every 6 (six) hours as needed for mild pain or moderate pain.     albuterol (VENTOLIN HFA) 108 (90 Base) MCG/ACT inhaler Inhale 2 puffs into the lungs every 6 (six) hours as needed for wheezing or shortness of breath. 8 g 2   amLODipine (NORVASC) 10 MG tablet Take 10 mg by mouth daily.     aspirin EC 81 MG tablet Take 81 mg by mouth daily.     atorvastatin (LIPITOR) 40 MG tablet TAKE 40 MG BY MOUTH ONCE DAILY (Patient taking differently: Take 40 mg by mouth daily.) 90 tablet 3   guaiFENesin-dextromethorphan (ROBITUSSIN DM) 100-10 MG/5ML syrup Take 5 mLs by mouth every 4 (four) hours as needed for cough (chest congestion). 118 mL 0   HYDROcodone bit-homatropine (HYCODAN) 5-1.5 MG/5ML syrup Take 5 mLs by mouth at bedtime as needed for cough. 120 mL 0   lidocaine-prilocaine (EMLA) cream Apply 1 application topically every Monday, Wednesday, and Friday with hemodialysis.     LORazepam (ATIVAN) 0.5 MG tablet Take 1 tablet (0.5 mg total) by mouth 2 (two) times daily as needed for anxiety. 60 tablet 0   losartan (COZAAR) 25 MG tablet Take 25-50 mg by mouth See admin instructions. Take 1 tablet (25mg ) by mouth daily on Tuesday, Thursday, Saturday and Sunday and take 2 tablets (50mg ) by mouth daily on Monday, Wednesday and Friday before  dialysis     metoprolol succinate (TOPROL-XL) 25 MG 24 hr tablet Take 25 mg by mouth every evening.     multivitamin (RENA-VIT) TABS tablet Take 1 tablet by mouth at bedtime. 30 tablet 0   omeprazole (PRILOSEC) 20 MG capsule Take 1 capsule (20 mg total) by mouth daily. 90 capsule 3   sevelamer carbonate (RENVELA) 800 MG tablet Take 800-1,600 mg by mouth See admin instructions. Take 2 tablets (1600 mg) by mouth three times daily with meals and take 1 tablet (800 mg) by mouth twice daily with snacks     torsemide (DEMADEX) 20 MG tablet Take 20 mg by mouth 4 (four) times a week. (Morning of non-dialysis days)     No current facility-administered medications on file prior to visit.    Allergies  Allergen Reactions   Naproxen Sodium Anaphylaxis and Swelling   Sulfamethoxazole-Trimethoprim Anaphylaxis and Swelling    Past Medical History:  Diagnosis Date   Allergy    Anemia    Arthritis    "right knee" (10/26'/2017)   CKD (chronic kidney disease), stage III ([place])    12/2015 (Dr. 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   Type II diabetes mellitus (  [place]) 2011   on metformin until yr ago- no med now - off due to kidneys    Past Surgical History:  Procedure Laterality Date   A/V FISTULAGRAM Left 03/10/2019   Procedure: A/V FISTULAGRAM;  Surgeon: Katha Cabal, MD;  Location: [facility];  Service: Cardiovascular;  Laterality: Left;   A/V FISTULAGRAM Left 07/14/2019   Procedure: A/V FISTULAGRAM;  Surgeon: Katha Cabal, MD;  Location: [facility];  Service: Cardiovascular;  Laterality: Left;   A/V FISTULAGRAM Left 01/07/2020   Procedure: A/V FISTULAGRAM;  Surgeon: Katha Cabal, MD;  Location: [facility];  Service: Cardiovascular;  Laterality: Left;   AV FISTULA PLACEMENT Left 11/07/2018   Procedure:  ARTERIOVENOUS (AV) FISTULA CREATION ( BRACHIO BASILIC ) VS BRACHIAL AXILLARY GRAFT;  Surgeon: Katha Cabal, MD;  Location: [facility];  Service: Vascular;  Laterality: Left;   BRAIN MENINGIOMA EXCISION  08/2006   Foramina magnum meningioma   CERVICAL DISCECTOMY  1996   Dr. Alric Seton   CESAREAN SECTION  1981   COLONOSCOPY     COLONOSCOPY WITH PROPOFOL N/A 03/07/2021   Procedure: COLONOSCOPY WITH PROPOFOL;  Surgeon: Virgel Manifold, MD;  Location: [facility];  Service: Endoscopy;  Laterality: N/A;   DIALYSIS/PERMA CATHETER INSERTION N/A 08/04/2018   Procedure: DIALYSIS/PERMA CATHETER INSERTION;  Surgeon: Algernon Huxley, MD;  Location: [facility];  Service: Cardiovascular;  Laterality: N/A;   DIALYSIS/PERMA CATHETER REMOVAL N/A 12/25/2018   Procedure: DIALYSIS/PERMA CATHETER REMOVAL;  Surgeon: Algernon Huxley, MD;  Location: [facility];  Service: Cardiovascular;  Laterality: N/A;   DILATION AND CURETTAGE OF UTERUS     JOINT REPLACEMENT     right knee left hip   Right wrist x-ray  2007   Deg. at 1st MCP   TOTAL HIP ARTHROPLASTY Left 03/27/2016   Procedure: LEFT TOTAL HIP ARTHROPLASTY ANTERIOR APPROACH;  Surgeon: Mcarthur Rossetti, MD;  Location: [facility];  Service: Orthopedics;  Laterality: Left;   TOTAL KNEE ARTHROPLASTY Right 04/30/2017   Procedure: RIGHT TOTAL KNEE ARTHROPLASTY;  Surgeon: Mcarthur Rossetti, MD;  Location: [facility];  Service: Orthopedics;  Laterality: Right;   TUBAL LIGATION  1981    Family History  Problem Relation Age of Onset   Hypertension Other        Strong family history    Breast cancer Mother 19   Heart failure Sister    Heart disease Sister     Social History   Socioeconomic History   Marital status: Widowed    Spouse name: Not on file   Number of children: 1   Years of education: Not on file   Highest education level: Not on file  Occupational History   Occupation: Formerly Psychologist, forensic, 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      No living will   Requests niece, [name], as health care POA   Would accept resuscitation attempts   No tube feeds if cognitively aware   Social Determinants of Health   Financial Resource Strain:  Low Risk    Difficulty of Paying Living Expenses: Not hard at all  Food Insecurity: No Food Insecurity   Worried About Running Out of Food in the Last Year: Never true   Ran Out of Food in the Last Year: Never true  Transportation Needs: No Transportation Needs   Lack of Transportation (Medical): No   Lack of Transportation (Non-Medical): No  Physical Activity: Not on file  Stress: Not on file  Social Connections: Not on file  Intimate Partner Violence: Not on file   Review of Systems No fever Hearing is okay Rare tinnitus Still doing PT and her own regular exercise    Objective:   Physical Exam Constitutional:      Appearance: Normal appearance.  HENT:     Ears:     Comments: Right canal occluded with cerumen Left has mild tragal tenderness. Cerumen looks red. No clear mass. ?slight irritation Neurological:     Mental Status: She is alert.           Assessment & Plan:

## 2021-06-09 DIAGNOSIS — N2581 Secondary hyperparathyroidism of renal origin: Secondary | ICD-10-CM | POA: Diagnosis not present

## 2021-06-09 DIAGNOSIS — Z992 Dependence on renal dialysis: Secondary | ICD-10-CM | POA: Diagnosis not present

## 2021-06-09 DIAGNOSIS — N186 End stage renal disease: Secondary | ICD-10-CM | POA: Diagnosis not present

## 2021-06-14 DIAGNOSIS — N186 End stage renal disease: Secondary | ICD-10-CM | POA: Diagnosis not present

## 2021-06-14 DIAGNOSIS — N2581 Secondary hyperparathyroidism of renal origin: Secondary | ICD-10-CM | POA: Diagnosis not present

## 2021-06-14 DIAGNOSIS — Z992 Dependence on renal dialysis: Secondary | ICD-10-CM | POA: Diagnosis not present

## 2021-06-16 DIAGNOSIS — Z992 Dependence on renal dialysis: Secondary | ICD-10-CM | POA: Diagnosis not present

## 2021-06-16 DIAGNOSIS — N186 End stage renal disease: Secondary | ICD-10-CM | POA: Diagnosis not present

## 2021-06-16 DIAGNOSIS — N2581 Secondary hyperparathyroidism of renal origin: Secondary | ICD-10-CM | POA: Diagnosis not present

## 2021-06-19 DIAGNOSIS — N2581 Secondary hyperparathyroidism of renal origin: Secondary | ICD-10-CM | POA: Diagnosis not present

## 2021-06-19 DIAGNOSIS — Z992 Dependence on renal dialysis: Secondary | ICD-10-CM | POA: Diagnosis not present

## 2021-06-19 DIAGNOSIS — N186 End stage renal disease: Secondary | ICD-10-CM | POA: Diagnosis not present

## 2021-06-21 DIAGNOSIS — Z992 Dependence on renal dialysis: Secondary | ICD-10-CM | POA: Diagnosis not present

## 2021-06-21 DIAGNOSIS — N186 End stage renal disease: Secondary | ICD-10-CM | POA: Diagnosis not present

## 2021-06-21 DIAGNOSIS — N2581 Secondary hyperparathyroidism of renal origin: Secondary | ICD-10-CM | POA: Diagnosis not present

## 2021-06-23 DIAGNOSIS — N2581 Secondary hyperparathyroidism of renal origin: Secondary | ICD-10-CM | POA: Diagnosis not present

## 2021-06-23 DIAGNOSIS — Z992 Dependence on renal dialysis: Secondary | ICD-10-CM | POA: Diagnosis not present

## 2021-06-23 DIAGNOSIS — N186 End stage renal disease: Secondary | ICD-10-CM | POA: Diagnosis not present

## 2021-06-26 DIAGNOSIS — Z992 Dependence on renal dialysis: Secondary | ICD-10-CM | POA: Diagnosis not present

## 2021-06-26 DIAGNOSIS — N186 End stage renal disease: Secondary | ICD-10-CM | POA: Diagnosis not present

## 2021-06-26 DIAGNOSIS — N2581 Secondary hyperparathyroidism of renal origin: Secondary | ICD-10-CM | POA: Diagnosis not present

## 2021-06-26 DIAGNOSIS — E119 Type 2 diabetes mellitus without complications: Secondary | ICD-10-CM | POA: Diagnosis not present

## 2021-06-28 DIAGNOSIS — Z992 Dependence on renal dialysis: Secondary | ICD-10-CM | POA: Diagnosis not present

## 2021-06-28 DIAGNOSIS — N2581 Secondary hyperparathyroidism of renal origin: Secondary | ICD-10-CM | POA: Diagnosis not present

## 2021-06-28 DIAGNOSIS — N186 End stage renal disease: Secondary | ICD-10-CM | POA: Diagnosis not present

## 2021-06-30 DIAGNOSIS — N186 End stage renal disease: Secondary | ICD-10-CM | POA: Diagnosis not present

## 2021-06-30 DIAGNOSIS — N2581 Secondary hyperparathyroidism of renal origin: Secondary | ICD-10-CM | POA: Diagnosis not present

## 2021-06-30 DIAGNOSIS — Z992 Dependence on renal dialysis: Secondary | ICD-10-CM | POA: Diagnosis not present

## 2021-07-03 DIAGNOSIS — Z992 Dependence on renal dialysis: Secondary | ICD-10-CM | POA: Diagnosis not present

## 2021-07-03 DIAGNOSIS — N186 End stage renal disease: Secondary | ICD-10-CM | POA: Diagnosis not present

## 2021-07-03 DIAGNOSIS — N2581 Secondary hyperparathyroidism of renal origin: Secondary | ICD-10-CM | POA: Diagnosis not present

## 2021-07-04 DIAGNOSIS — Z992 Dependence on renal dialysis: Secondary | ICD-10-CM | POA: Diagnosis not present

## 2021-07-04 DIAGNOSIS — N186 End stage renal disease: Secondary | ICD-10-CM | POA: Diagnosis not present

## 2021-07-05 DIAGNOSIS — N2581 Secondary hyperparathyroidism of renal origin: Secondary | ICD-10-CM | POA: Diagnosis not present

## 2021-07-05 DIAGNOSIS — N186 End stage renal disease: Secondary | ICD-10-CM | POA: Diagnosis not present

## 2021-07-05 DIAGNOSIS — Z992 Dependence on renal dialysis: Secondary | ICD-10-CM | POA: Diagnosis not present

## 2021-07-07 DIAGNOSIS — N186 End stage renal disease: Secondary | ICD-10-CM | POA: Diagnosis not present

## 2021-07-07 DIAGNOSIS — Z992 Dependence on renal dialysis: Secondary | ICD-10-CM | POA: Diagnosis not present

## 2021-07-07 DIAGNOSIS — N2581 Secondary hyperparathyroidism of renal origin: Secondary | ICD-10-CM | POA: Diagnosis not present

## 2021-07-10 DIAGNOSIS — N186 End stage renal disease: Secondary | ICD-10-CM | POA: Diagnosis not present

## 2021-07-10 DIAGNOSIS — N2581 Secondary hyperparathyroidism of renal origin: Secondary | ICD-10-CM | POA: Diagnosis not present

## 2021-07-10 DIAGNOSIS — Z992 Dependence on renal dialysis: Secondary | ICD-10-CM | POA: Diagnosis not present

## 2021-07-12 DIAGNOSIS — Z992 Dependence on renal dialysis: Secondary | ICD-10-CM | POA: Diagnosis not present

## 2021-07-12 DIAGNOSIS — N186 End stage renal disease: Secondary | ICD-10-CM | POA: Diagnosis not present

## 2021-07-12 DIAGNOSIS — N2581 Secondary hyperparathyroidism of renal origin: Secondary | ICD-10-CM | POA: Diagnosis not present

## 2021-07-14 DIAGNOSIS — N2581 Secondary hyperparathyroidism of renal origin: Secondary | ICD-10-CM | POA: Diagnosis not present

## 2021-07-14 DIAGNOSIS — N186 End stage renal disease: Secondary | ICD-10-CM | POA: Diagnosis not present

## 2021-07-14 DIAGNOSIS — Z992 Dependence on renal dialysis: Secondary | ICD-10-CM | POA: Diagnosis not present

## 2021-07-17 DIAGNOSIS — Z992 Dependence on renal dialysis: Secondary | ICD-10-CM | POA: Diagnosis not present

## 2021-07-17 DIAGNOSIS — N2581 Secondary hyperparathyroidism of renal origin: Secondary | ICD-10-CM | POA: Diagnosis not present

## 2021-07-17 DIAGNOSIS — N186 End stage renal disease: Secondary | ICD-10-CM | POA: Diagnosis not present

## 2021-07-19 DIAGNOSIS — N186 End stage renal disease: Secondary | ICD-10-CM | POA: Diagnosis not present

## 2021-07-19 DIAGNOSIS — Z992 Dependence on renal dialysis: Secondary | ICD-10-CM | POA: Diagnosis not present

## 2021-07-19 DIAGNOSIS — N2581 Secondary hyperparathyroidism of renal origin: Secondary | ICD-10-CM | POA: Diagnosis not present

## 2021-07-20 ENCOUNTER — Other Ambulatory Visit: Payer: Self-pay | Admitting: Internal Medicine

## 2021-07-20 DIAGNOSIS — Z1231 Encounter for screening mammogram for malignant neoplasm of breast: Secondary | ICD-10-CM

## 2021-07-21 DIAGNOSIS — N186 End stage renal disease: Secondary | ICD-10-CM | POA: Diagnosis not present

## 2021-07-21 DIAGNOSIS — N2581 Secondary hyperparathyroidism of renal origin: Secondary | ICD-10-CM | POA: Diagnosis not present

## 2021-07-21 DIAGNOSIS — Z992 Dependence on renal dialysis: Secondary | ICD-10-CM | POA: Diagnosis not present

## 2021-07-24 DIAGNOSIS — N186 End stage renal disease: Secondary | ICD-10-CM | POA: Diagnosis not present

## 2021-07-24 DIAGNOSIS — Z992 Dependence on renal dialysis: Secondary | ICD-10-CM | POA: Diagnosis not present

## 2021-07-24 DIAGNOSIS — N2581 Secondary hyperparathyroidism of renal origin: Secondary | ICD-10-CM | POA: Diagnosis not present

## 2021-07-26 ENCOUNTER — Ambulatory Visit (INDEPENDENT_AMBULATORY_CARE_PROVIDER_SITE_OTHER): Payer: Medicare HMO | Admitting: Nurse Practitioner

## 2021-07-26 DIAGNOSIS — N2581 Secondary hyperparathyroidism of renal origin: Secondary | ICD-10-CM | POA: Diagnosis not present

## 2021-07-26 DIAGNOSIS — Z992 Dependence on renal dialysis: Secondary | ICD-10-CM | POA: Diagnosis not present

## 2021-07-26 DIAGNOSIS — N186 End stage renal disease: Secondary | ICD-10-CM | POA: Diagnosis not present

## 2021-07-27 ENCOUNTER — Other Ambulatory Visit: Payer: Self-pay

## 2021-07-27 ENCOUNTER — Telehealth: Payer: Medicare HMO

## 2021-07-27 ENCOUNTER — Ambulatory Visit (INDEPENDENT_AMBULATORY_CARE_PROVIDER_SITE_OTHER): Payer: Medicare HMO | Admitting: Nurse Practitioner

## 2021-07-27 VITALS — BP 163/85 | HR 79 | Ht 62.0 in | Wt 226.0 lb

## 2021-07-27 DIAGNOSIS — N186 End stage renal disease: Secondary | ICD-10-CM | POA: Diagnosis not present

## 2021-07-27 DIAGNOSIS — I10 Essential (primary) hypertension: Secondary | ICD-10-CM | POA: Diagnosis not present

## 2021-07-27 MED ORDER — DOXYCYCLINE HYCLATE 100 MG PO CAPS: 100.0000 mg | ORAL_CAPSULE | Freq: Two times a day (BID) | ORAL | 0 refills | Status: DC

## 2021-07-28 DIAGNOSIS — N2581 Secondary hyperparathyroidism of renal origin: Secondary | ICD-10-CM | POA: Diagnosis not present

## 2021-07-28 DIAGNOSIS — Z992 Dependence on renal dialysis: Secondary | ICD-10-CM | POA: Diagnosis not present

## 2021-07-28 DIAGNOSIS — N186 End stage renal disease: Secondary | ICD-10-CM | POA: Diagnosis not present

## 2021-07-31 ENCOUNTER — Telehealth: Payer: Self-pay | Admitting: Internal Medicine

## 2021-07-31 DIAGNOSIS — Z992 Dependence on renal dialysis: Secondary | ICD-10-CM | POA: Diagnosis not present

## 2021-07-31 DIAGNOSIS — N2581 Secondary hyperparathyroidism of renal origin: Secondary | ICD-10-CM | POA: Diagnosis not present

## 2021-07-31 DIAGNOSIS — N186 End stage renal disease: Secondary | ICD-10-CM | POA: Diagnosis not present

## 2021-07-31 MED ORDER — HYDROCODONE BIT-HOMATROP MBR 5-1.5 MG/5ML PO SOLN: 5.0000 mL | Freq: Every evening | ORAL | 0 refills | Status: DC | PRN

## 2021-07-31 NOTE — Addendum Note (Signed)
Addended by: Viviana Simpler I on: 07/31/2021 12:53 PM   Modules accepted: Orders

## 2021-07-31 NOTE — Telephone Encounter (Signed)
Refill sent.

## 2021-07-31 NOTE — Telephone Encounter (Signed)
Pt needs a refill on HYDROcodone bit-homatropine (HYCODAN) 5-1.5 MG/5ML syrup sent to [facility]

## 2021-08-01 DIAGNOSIS — Z992 Dependence on renal dialysis: Secondary | ICD-10-CM | POA: Diagnosis not present

## 2021-08-01 DIAGNOSIS — N186 End stage renal disease: Secondary | ICD-10-CM | POA: Diagnosis not present

## 2021-08-02 DIAGNOSIS — N2581 Secondary hyperparathyroidism of renal origin: Secondary | ICD-10-CM | POA: Diagnosis not present

## 2021-08-02 DIAGNOSIS — N186 End stage renal disease: Secondary | ICD-10-CM | POA: Diagnosis not present

## 2021-08-02 DIAGNOSIS — Z992 Dependence on renal dialysis: Secondary | ICD-10-CM | POA: Diagnosis not present

## 2021-08-04 DIAGNOSIS — Z992 Dependence on renal dialysis: Secondary | ICD-10-CM | POA: Diagnosis not present

## 2021-08-04 DIAGNOSIS — N2581 Secondary hyperparathyroidism of renal origin: Secondary | ICD-10-CM | POA: Diagnosis not present

## 2021-08-04 DIAGNOSIS — N186 End stage renal disease: Secondary | ICD-10-CM | POA: Diagnosis not present

## 2021-08-07 ENCOUNTER — Encounter (INDEPENDENT_AMBULATORY_CARE_PROVIDER_SITE_OTHER): Payer: Self-pay | Admitting: Nurse Practitioner

## 2021-08-07 ENCOUNTER — Ambulatory Visit (INDEPENDENT_AMBULATORY_CARE_PROVIDER_SITE_OTHER): Payer: Medicare HMO

## 2021-08-07 DIAGNOSIS — Z992 Dependence on renal dialysis: Secondary | ICD-10-CM | POA: Diagnosis not present

## 2021-08-07 DIAGNOSIS — I5032 Chronic diastolic (congestive) heart failure: Secondary | ICD-10-CM

## 2021-08-07 DIAGNOSIS — N186 End stage renal disease: Secondary | ICD-10-CM

## 2021-08-07 DIAGNOSIS — N2581 Secondary hyperparathyroidism of renal origin: Secondary | ICD-10-CM | POA: Diagnosis not present

## 2021-08-07 DIAGNOSIS — G8929 Other chronic pain: Secondary | ICD-10-CM

## 2021-08-07 DIAGNOSIS — I10 Essential (primary) hypertension: Secondary | ICD-10-CM

## 2021-08-07 NOTE — Patient Instructions (Signed)
Visit Information ? ?Thank you for taking time to visit with me today. Please don't hesitate to contact me if I can be of assistance to you before our next scheduled telephone appointment. ? ?Following are the goals we discussed today:  ?Attend all scheduled provider appointments  ?Call provider office for new concerns or questions  ?Check blood pressure and oxygen levels at least 3 times per week and record. write blood pressure results in a log or diary (take log to your appointments to share information with your provider) ?Continue to follow a low sodium/ DASH diet and adhere to fluid restriction as recommended by your doctor.   ?Patient will weigh at least 2-3 times per week and record.  ?Elevate legs when sitting due to knee pain.  Apply ice for pain management.  ?Continue to take medications as prescribed and refill timely ? ?Our next appointment is by telephone on 09/19/21 at 1:00 pm ? ?Please call the care guide team at [phone] if you need to cancel or reschedule your appointment.  ? ?If you are experiencing a Mental Health or [place] or need someone to talk to, please call the Suicide and Crisis Lifeline: 988 ?call 1-800-273-TALK (toll free, 24 hour hotline)  ? ?Patient verbalizes understanding of instructions and care plan provided today and agrees to view in [facility]. Active MyChart status confirmed with patient.   ? ?Quinn Plowman RN,BSN,CCM ?RN Case Manager ?[place] [facility]  ?[phone] ? ?

## 2021-08-07 NOTE — Chronic Care Management (AMB) (Signed)
Chronic Care Management   CCM RN Visit Note  08/07/2021 Name: Carrie [name] MRN: [id number] DOB: 03-Oct-1950  Subjective: Golden Hurter Braddock is a 71 y.o. year old female who is a primary care patient of Venia Carbon, MD. The care management team was consulted for assistance with disease management and care coordination needs.    Engaged with patient by telephone for follow up visit in response to provider referral for case management and/or care coordination services.   Consent to Services:  The patient was given information about Chronic Care Management services, agreed to services, and gave verbal consent prior to initiation of services.  Please see initial visit note for detailed documentation.   Patient agreed to services and verbal consent obtained.   Assessment: Review of patient past medical history, allergies, medications, health status, including review of consultants reports, laboratory and other test data, was performed as part of comprehensive evaluation and provision of chronic care management services.   SDOH (Social Determinants of Health) assessments and interventions performed:    CCM Care Plan  Allergies  Allergen Reactions   Naproxen Sodium Anaphylaxis and Swelling   Sulfamethoxazole-Trimethoprim Anaphylaxis and Swelling    Outpatient Encounter Medications as of 08/07/2021  Medication Sig   acetaminophen (TYLENOL) 500 MG tablet Take 1,000 mg by mouth every 6 (six) hours as needed for mild pain or moderate pain.   albuterol (VENTOLIN HFA) 108 (90 Base) MCG/ACT inhaler Inhale 2 puffs into the lungs every 6 (six) hours as needed for wheezing or shortness of breath.   amLODipine (NORVASC) 10 MG tablet Take 10 mg by mouth daily.   aspirin EC 81 MG tablet Take 81 mg by mouth daily.   atorvastatin (LIPITOR) 40 MG tablet TAKE 40 MG BY MOUTH ONCE DAILY (Patient taking differently: Take 40 mg by mouth daily.)   b complex vitamins capsule Take 1 capsule by mouth daily.    HYDROcodone bit-homatropine (HYCODAN) 5-1.5 MG/5ML syrup Take 5 mLs by mouth at bedtime as needed for cough.   levETIRAcetam (KEPPRA) 1000 MG tablet Take 1,000 mg by mouth 2 (two) times daily.   lidocaine-prilocaine (EMLA) cream Apply 1 application topically every Monday, Wednesday, and Friday with hemodialysis.   LORazepam (ATIVAN) 0.5 MG tablet Take 1 tablet (0.5 mg total) by mouth 2 (two) times daily as needed for anxiety.   losartan (COZAAR) 25 MG tablet Take 25-50 mg by mouth See admin instructions. Take 1 tablet (50m) by mouth daily on Tuesday, Thursday, Saturday and Sunday and take 2 tablets (538m by mouth daily on Monday, Wednesday and Friday before dialysis   metoprolol succinate (TOPROL-XL) 25 MG 24 hr tablet Take 25 mg by mouth every evening.   multivitamin (RENA-VIT) TABS tablet Take 1 tablet by mouth at bedtime.   neomycin-polymyxin-hydrocortisone (CORTISPORIN) 3.5-10000-1 OTIC suspension Place 4 drops into the right ear 4 (four) times daily.   omeprazole (PRILOSEC) 20 MG capsule Take 1 capsule (20 mg total) by mouth daily.   sevelamer carbonate (RENVELA) 800 MG tablet Take 800-1,600 mg by mouth See admin instructions. Take 2 tablets (1600 mg) by mouth three times daily with meals and take 1 tablet (800 mg) by mouth twice daily with snacks   torsemide (DEMADEX) 20 MG tablet Take 20 mg by mouth 4 (four) times a week. (Morning of non-dialysis days)   doxycycline (VIBRAMYCIN) 100 MG capsule Take 1 capsule (100 mg total) by mouth 2 (two) times daily. (Patient not taking: Reported on 08/07/2021)   guaiFENesin-dextromethorphan (ROBITUSSIN DM) 100-10 MG/5ML  syrup Take 5 mLs by mouth every 4 (four) hours as needed for cough (chest congestion). (Patient not taking: Reported on 08/07/2021)   No facility-administered encounter medications on file as of 08/07/2021.    Patient Active Problem List   Diagnosis Date Noted   Chronic diastolic heart failure ([place]) 05/17/2021   Seizure disorder ([place])     Hyperlipidemia    SOB (shortness of breath) 05/01/2021   Anemia 05/01/2021   Chronic hypoxemic respiratory failure ([place]) 05/01/2021   Positive FIT (fecal immunochemical test)    Polyp of ascending colon    Polyp of descending colon    Polyp of transverse colon    Dysphagia 02/07/2021   Tremor 12/01/2020   Headache 12/01/2020   Eustachian tube dysfunction 10/25/2020   History of shingles 09/26/2020   Primary osteoarthritis of left knee 19/50/9326   Complication from renal dialysis device 11/20/2018   ESRD on hemodialysis ([place]) 08/04/2018   Cough 10/01/2017   Status post total right knee replacement 04/30/2017   Unilateral primary osteoarthritis, right knee 03/18/2017   History of total hip replacement, left 03/18/2017   Unilateral primary osteoarthritis, left hip 03/27/2016   Status post total replacement of left hip 03/27/2016   Mood disorder ([place]) 04/01/2015   Osteoarthritis of right knee 02/03/2014   Advanced directives, counseling/discussion 02/03/2014   Routine general medical examination at a health care facility 07/08/2012   Obesity, Class III, BMI 40-49.9 (morbid obesity) ([place]) 07/08/2012   Type 2 diabetes, controlled, with renal manifestation ([place])    Otitis externa 03/20/2010   HYPERCHOLESTEROLEMIA 09/26/2006   Essential hypertension 09/26/2006   ALLERGIC RHINITIS 09/26/2006   History of meningioma 08/03/2006    Conditions to be addressed/monitored:CHF, HTN, ESRD, and Pain  Care Plan : RN Case Manager Plan of care  Updates made by Dannielle Karvonen, RN since 08/07/2021 12:00 AM     Problem: Knowledge deficit related to long term care plan for management of chronic conditions ( HTN, HF, ESRD)   Priority: High     Long-Range Goal: Chronic conditions monitored and managed ( HTN, HF, ESRD   Start Date: 04/11/2021  Expected End Date: 11/01/2021  Priority: High  Note:   Current Barriers:  Knowledge Deficits related to plan of care for management of CHF, HTN, and ESRD   Chronic Disease Management support and education needs related to CHF, HTN, and ESRD Patient states she is doing well.  She reports having follow up with the vascular doctor on 07/27/21 and follow up with her primary provider on 06/07/21.  Patient states she still becomes short of breath at times.  She reports using her oxygen at 2L and/ or inhalers as needed. Patient states her blood pressures are still up and down but not having readings over 200's as before.  She states she is not monitoring blood pressure at home due to having blood pressures monitored at dialysis.  Patient reports her weight today was 226 lbs.  Patient states she continues to have pain in her knees. Uses ice/ tylenol as needed.  RNCM Clinical Goal(s):  Patient will verbalize basic understanding of CHF, HTN, and ESRD disease process and self health management plan as evidenced by weighing at least 3 times per week, taking medications as prescribed, monitoring blood pressure at least 2-3 days per week and recording.  take all medications exactly as prescribed and will call provider for medication related questions as evidenced by      attend all scheduled medical appointments:   as evidenced  by patient reporting they have attended appointments.         continue to work with Consulting civil engineer and/or Social Worker to address care management and care coordination needs related to CHF, HTN, and ESRD as evidenced by adherence to CM Team Scheduled appointments     through collaboration with Consulting civil engineer, provider, and care team.   Interventions: 1:1 collaboration with primary care provider regarding development and update of comprehensive plan of care as evidenced by provider attestation and co-signature Inter-disciplinary care team collaboration (see longitudinal plan of care) Evaluation of current treatment plan related to  self management and patient's adherence to plan as established by provider  Hypertension Interventions: Goal on  track Last practice recorded BP readings:  BP Readings from Last 3 Encounters:  07/27/21 (!) 163/85  06/07/21 128/74  05/17/21 140/90  Most recent eGFR/CrCl: No results found for: EGFR  No components found for: CRCL  Evaluation of current treatment plan related to hypertension self management and patient's adherence to plan as established by provider; Reviewed medications with patient and discussed importance of compliance; Discussed plans with patient for ongoing care management follow up and provided patient with direct contact information for care management team; Reviewed scheduled/upcoming provider appointments i Advised to continue to follow low salt diet.  Pain Interventions: Pain assessment performed Medications reviewed Discussed importance of adherence to all scheduled medical appointments; Counseled on the importance of reporting any/all new or changed pain symptoms or management strategies to pain management provider; Reviewed with patient prescribed pharmacological and nonpharmacological pain relief strategies;   ESRD Interventions:  Goal on track  Reviewed medications with patient and discussed ; Reviewed scheduled/upcoming provider appointments  Discussed plans with patient for ongoing care management follow up and provided patient with direct contact information for care management team;  Heart Failure Interventions: New Goal Discussed heart failure action plan/ zones Discussed importance of daily weight and advised patient to weigh and record daily; Discussed the importance of keeping all appointments with provider;  Reviewed medications and discussed Advised to follow low salt diet and fluid restrictions as advised by provider.   Patient Goals/Self-Care Activities: Attend all scheduled provider appointments  Call provider office for new concerns or questions  Check blood pressure and oxygen levels at least 3 times per week and record. write blood pressure results  in a log or diary (take log to your appointments to share information with your provider) Continue to follow a low sodium/ DASH diet and adhere to fluid restriction as recommended by your doctor.   Patient will weigh at least 2-3 times per week and record.  Elevate legs when sitting due to knee pain.  Apply ice for pain management.  Continue to take medications as prescribed and refill timely       Plan:The patient has been provided with contact information for the care management team and has been advised to call with any health related questions or concerns.  The care management team will reach out to the patient again over the next 45 days. Quinn Plowman RN,BSN,CCM RN Case Manager [place]  [phone]

## 2021-08-07 NOTE — Progress Notes (Signed)
Subjective:    Patient ID: Carrie [name], female    DOB: 1951-04-05, 71 y.o.   MRN: [id number] Chief Complaint  Patient presents with   Follow-up    3 mo no studies    Carrie [name] is a 71 year old female who presents today for follow-up access of her of her dialysis access.  Previously there were some moderately mildly elevated velocities near the anastomosis, however she notes that there are no issues currently with cannulation.  She has been doing well with dialysis.  She denies any significant bleeding postdialysis.  She denies any significant alarming during dialysis.  Overall it has been going well.   Review of Systems  Hematological:  Does not bruise/bleed easily.  All other systems reviewed and are negative.     Objective:   Physical Exam Vitals reviewed.  HENT:     Head: Normocephalic.  Cardiovascular:     Rate and Rhythm: Normal rate.     Pulses: Normal pulses.     Arteriovenous access: Left arteriovenous access is present.    Comments: Good thrill and bruit Pulmonary:     Effort: Pulmonary effort is normal.  Skin:    General: Skin is warm and dry.  Neurological:     Mental Status: She is alert and oriented to person, place, and time.  Psychiatric:        Mood and Affect: Mood normal.        Behavior: Behavior normal.        Thought Content: Thought content normal.        Judgment: Judgment normal.    BP (!) 163/85    Pulse 79    Ht '5\' 2"'$  (1.575 m)    Wt 226 lb (102.5 kg)    BMI 41.34 kg/m   Past Medical History:  Diagnosis Date   Allergy    Anemia    Arthritis    "right knee" (10/26'/2017)   CKD (chronic kidney disease), stage III ([place])    12/2015 (Dr. 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      No living will   Requests niece, [name], as health care POA   Would accept resuscitation attempts   No tube feeds if cognitively aware   Social Determinants of Health   Financial Resource Strain: Low Risk    Difficulty of Paying Living Expenses: Not hard at all  Food Insecurity: No Food Insecurity   Worried About Charity fundraiser in the Last Year: Never true   Ran Out of Food in the Last Year: Never true  Transportation Needs: No Transportation Needs   Lack of Transportation (Medical): No   Lack of Transportation (Non-Medical): No  Physical Activity: Not on file  Stress: Not on file  Social Connections: Not on file  Intimate Partner Violence: Not on file    Past Surgical History:  Procedure Laterality Date   A/V FISTULAGRAM Left 03/10/2019   Procedure: A/V FISTULAGRAM;  Surgeon: Katha Cabal, MD;  Location: [facility];  Service: Cardiovascular;  Laterality: Left;   A/V FISTULAGRAM Left 07/14/2019   Procedure: A/V FISTULAGRAM;  Surgeon: Katha Cabal, MD;  Location: [place] [facility];  Service: Cardiovascular;  Laterality: Left;   A/V FISTULAGRAM Left 01/07/2020   Procedure: A/V FISTULAGRAM;  Surgeon: Katha Cabal, MD;  Location: [facility] [facility];  Service: Cardiovascular;  Laterality:  Left;   AV FISTULA PLACEMENT Left 11/07/2018   Procedure: ARTERIOVENOUS (AV) FISTULA CREATION ( BRACHIO BASILIC ) VS BRACHIAL AXILLARY GRAFT;  Surgeon: Katha Cabal, MD;  Location: [facility];  Service: Vascular;  Laterality: Left;   BRAIN MENINGIOMA EXCISION  08/2006   Foramina magnum meningioma   CERVICAL DISCECTOMY  1996   Dr. Alric Seton   CESAREAN SECTION  1981   COLONOSCOPY     COLONOSCOPY WITH PROPOFOL N/A 03/07/2021   Procedure: COLONOSCOPY WITH PROPOFOL;  Surgeon: Virgel Manifold, MD;  Location: [facility];  Service: Endoscopy;  Laterality: N/A;   DIALYSIS/PERMA CATHETER INSERTION N/A 08/04/2018   Procedure: DIALYSIS/PERMA CATHETER INSERTION;  Surgeon: Algernon Huxley, MD;  Location: [facility];  Service: Cardiovascular;  Laterality: N/A;   DIALYSIS/PERMA CATHETER REMOVAL N/A 12/25/2018   Procedure: DIALYSIS/PERMA CATHETER REMOVAL;  Surgeon: Algernon Huxley, MD;  Location: [place] [facility];  Service: Cardiovascular;  Laterality: N/A;   DILATION AND CURETTAGE OF UTERUS     JOINT REPLACEMENT     right knee left hip   Right wrist x-ray  2007   Deg. at 1st MCP   TOTAL HIP ARTHROPLASTY Left 03/27/2016   Procedure: LEFT TOTAL HIP ARTHROPLASTY ANTERIOR APPROACH;  Surgeon: Mcarthur Rossetti, MD;  Location: [place];  Service: Orthopedics;  Laterality: Left;   TOTAL KNEE ARTHROPLASTY Right 04/30/2017   Procedure: RIGHT TOTAL KNEE ARTHROPLASTY;  Surgeon: Mcarthur Rossetti, MD;  Location: [facility];  Service: Orthopedics;  Laterality: Right;   TUBAL LIGATION  1981    Family History  Problem Relation Age of Onset   Hypertension Other        Strong family history    Breast cancer Mother 79   Heart failure Sister    Heart disease Sister     Allergies  Allergen Reactions   Naproxen Sodium Anaphylaxis and Swelling   Sulfamethoxazole-Trimethoprim Anaphylaxis and Swelling    CBC Latest Ref Rng & Units 05/04/2021 05/02/2021 05/01/2021  WBC 4.0 - 10.5 K/uL 4.2 5.5 6.8   Hemoglobin 12.0 - 15.0 g/dL 10.0(L) 9.7(L) 9.8(L)  Hematocrit 36.0 - 46.0 % 30.8(L) 30.9(L) 31.2(L)  Platelets 150 - 400 K/uL 176 170 194      CMP     Component Value Date/Time   NA 134 (L) 05/05/2021 0458   K 3.9 05/05/2021 0458   CL 95 (L) 05/05/2021 0458   CO2 29 05/05/2021 0458   GLUCOSE 87 05/05/2021 0458   BUN 56 (H)  05/05/2021 0458   BUN 36 (A) 07/08/2017 0000   CREATININE 8.77 (H) 05/05/2021 0458   CALCIUM 8.3 (L) 05/05/2021 0458   PROT 7.3 05/02/2021 0524   ALBUMIN 3.6 05/02/2021 0524   AST 14 (L) 05/02/2021 0524   ALT 12 05/02/2021 0524   ALKPHOS 95 05/02/2021 0524   BILITOT 0.7 05/02/2021 0524   GFRNONAA 4 (L) 05/05/2021 0458   GFRAA 9 (L) 11/04/2018 1208     No results found.     Assessment & Plan:   1. ESRD (end stage renal disease) ([place]) Recommend:  The patient is doing well and currently has adequate dialysis access. The patient's dialysis center is not reporting any access issues. Flow pattern is stable when compared to the prior ultrasound.  The patient should have a duplex ultrasound of the dialysis access in 3 months. The patient will follow-up with me in the office after each ultrasound     2. Essential hypertension, benign Continue antihypertensive medications as already ordered, these medications have been reviewed and there are no changes at this time.    Current Outpatient Medications on File Prior to Visit  Medication Sig Dispense Refill   acetaminophen (TYLENOL) 500 MG tablet Take 1,000 mg by mouth every 6 (six) hours as needed for mild pain or moderate pain.     albuterol (VENTOLIN HFA) 108 (90 Base) MCG/ACT inhaler Inhale 2 puffs into the lungs every 6 (six) hours as needed for wheezing or shortness of breath. 8 g 2   amLODipine (NORVASC) 10 MG tablet Take 10 mg by mouth daily.     aspirin EC 81 MG tablet Take 81 mg by mouth daily.     atorvastatin (LIPITOR) 40 MG tablet TAKE 40 MG BY MOUTH ONCE DAILY (Patient taking differently:  Take 40 mg by mouth daily.) 90 tablet 3   b complex vitamins capsule Take 1 capsule by mouth daily.     guaiFENesin-dextromethorphan (ROBITUSSIN DM) 100-10 MG/5ML syrup Take 5 mLs by mouth every 4 (four) hours as needed for cough (chest congestion). 118 mL 0   lidocaine-prilocaine (EMLA) cream Apply 1 application topically every Monday, Wednesday, and Friday with hemodialysis.     LORazepam (ATIVAN) 0.5 MG tablet Take 1 tablet (0.5 mg total) by mouth 2 (two) times daily as needed for anxiety. 60 tablet 0   losartan (COZAAR) 25 MG tablet Take 25-50 mg by mouth See admin instructions. Take 1 tablet ('25mg'$ ) by mouth daily on Tuesday, Thursday, Saturday and Sunday and take 2 tablets ('50mg'$ ) by mouth daily on Monday, Wednesday and Friday before dialysis     metoprolol succinate (TOPROL-XL) 25 MG 24 hr tablet Take 25 mg by mouth every evening.     multivitamin (RENA-VIT) TABS tablet Take 1 tablet by mouth at bedtime. 30 tablet 0   neomycin-polymyxin-hydrocortisone (CORTISPORIN) 3.5-10000-1 OTIC suspension Place 4 drops into the right ear 4 (four) times daily. 10 mL 1   omeprazole (PRILOSEC) 20 MG capsule Take 1 capsule (20 mg total) by mouth daily. 90 capsule 3   sevelamer carbonate (RENVELA) 800 MG tablet Take 800-1,600 mg by mouth See admin instructions. Take 2 tablets (1600 mg) by mouth three times daily with meals and take 1 tablet (800 mg) by mouth twice daily with snacks     torsemide (DEMADEX) 20 MG tablet Take 20 mg by mouth 4 (four) times a week. (Morning of non-dialysis days)     levETIRAcetam (KEPPRA) 1000 MG tablet Take 1,000 mg by mouth 2 (two) times daily.  No current facility-administered medications on file prior to visit.    There are no Patient Instructions on file for this visit. No follow-ups on file.   Kris Hartmann, NP

## 2021-08-09 DIAGNOSIS — N2581 Secondary hyperparathyroidism of renal origin: Secondary | ICD-10-CM | POA: Diagnosis not present

## 2021-08-09 DIAGNOSIS — N186 End stage renal disease: Secondary | ICD-10-CM | POA: Diagnosis not present

## 2021-08-09 DIAGNOSIS — Z992 Dependence on renal dialysis: Secondary | ICD-10-CM | POA: Diagnosis not present

## 2021-08-11 DIAGNOSIS — N186 End stage renal disease: Secondary | ICD-10-CM | POA: Diagnosis not present

## 2021-08-11 DIAGNOSIS — N2581 Secondary hyperparathyroidism of renal origin: Secondary | ICD-10-CM | POA: Diagnosis not present

## 2021-08-11 DIAGNOSIS — Z992 Dependence on renal dialysis: Secondary | ICD-10-CM | POA: Diagnosis not present

## 2021-08-14 DIAGNOSIS — Z992 Dependence on renal dialysis: Secondary | ICD-10-CM | POA: Diagnosis not present

## 2021-08-14 DIAGNOSIS — N186 End stage renal disease: Secondary | ICD-10-CM | POA: Diagnosis not present

## 2021-08-14 DIAGNOSIS — N2581 Secondary hyperparathyroidism of renal origin: Secondary | ICD-10-CM | POA: Diagnosis not present

## 2021-08-16 DIAGNOSIS — Z992 Dependence on renal dialysis: Secondary | ICD-10-CM | POA: Diagnosis not present

## 2021-08-16 DIAGNOSIS — N186 End stage renal disease: Secondary | ICD-10-CM | POA: Diagnosis not present

## 2021-08-16 DIAGNOSIS — N2581 Secondary hyperparathyroidism of renal origin: Secondary | ICD-10-CM | POA: Diagnosis not present

## 2021-08-18 DIAGNOSIS — N186 End stage renal disease: Secondary | ICD-10-CM | POA: Diagnosis not present

## 2021-08-18 DIAGNOSIS — Z992 Dependence on renal dialysis: Secondary | ICD-10-CM | POA: Diagnosis not present

## 2021-08-18 DIAGNOSIS — N2581 Secondary hyperparathyroidism of renal origin: Secondary | ICD-10-CM | POA: Diagnosis not present

## 2021-08-21 ENCOUNTER — Other Ambulatory Visit: Payer: Self-pay | Admitting: Internal Medicine

## 2021-08-21 DIAGNOSIS — N186 End stage renal disease: Secondary | ICD-10-CM | POA: Diagnosis not present

## 2021-08-21 DIAGNOSIS — Z992 Dependence on renal dialysis: Secondary | ICD-10-CM | POA: Diagnosis not present

## 2021-08-21 DIAGNOSIS — N2581 Secondary hyperparathyroidism of renal origin: Secondary | ICD-10-CM | POA: Diagnosis not present

## 2021-08-22 NOTE — Telephone Encounter (Signed)
Last filled 06-07-21 #60 ?Last OV 06-07-21 ?Next OV 02-20-22 ?[facility] ?

## 2021-08-23 DIAGNOSIS — Z992 Dependence on renal dialysis: Secondary | ICD-10-CM | POA: Diagnosis not present

## 2021-08-23 DIAGNOSIS — N2581 Secondary hyperparathyroidism of renal origin: Secondary | ICD-10-CM | POA: Diagnosis not present

## 2021-08-23 DIAGNOSIS — N186 End stage renal disease: Secondary | ICD-10-CM | POA: Diagnosis not present

## 2021-08-24 DIAGNOSIS — Z0100 Encounter for examination of eyes and vision without abnormal findings: Secondary | ICD-10-CM | POA: Diagnosis not present

## 2021-08-24 DIAGNOSIS — E113291 Type 2 diabetes mellitus with mild nonproliferative diabetic retinopathy without macular edema, right eye: Secondary | ICD-10-CM | POA: Diagnosis not present

## 2021-08-24 DIAGNOSIS — H35372 Puckering of macula, left eye: Secondary | ICD-10-CM | POA: Diagnosis not present

## 2021-08-24 LAB — HM DIABETES EYE EXAM

## 2021-08-25 DIAGNOSIS — N2581 Secondary hyperparathyroidism of renal origin: Secondary | ICD-10-CM | POA: Diagnosis not present

## 2021-08-25 DIAGNOSIS — Z992 Dependence on renal dialysis: Secondary | ICD-10-CM | POA: Diagnosis not present

## 2021-08-25 DIAGNOSIS — N186 End stage renal disease: Secondary | ICD-10-CM | POA: Diagnosis not present

## 2021-08-28 DIAGNOSIS — N186 End stage renal disease: Secondary | ICD-10-CM | POA: Diagnosis not present

## 2021-08-28 DIAGNOSIS — Z992 Dependence on renal dialysis: Secondary | ICD-10-CM | POA: Diagnosis not present

## 2021-08-28 DIAGNOSIS — N2581 Secondary hyperparathyroidism of renal origin: Secondary | ICD-10-CM | POA: Diagnosis not present

## 2021-08-29 ENCOUNTER — Other Ambulatory Visit: Payer: Self-pay

## 2021-08-29 ENCOUNTER — Ambulatory Visit
Admission: RE | Admit: 2021-08-29 | Discharge: 2021-08-29 | Disposition: A | Discharge status: Home / Self Care | Payer: Medicare HMO | Source: Ambulatory Visit | Attending: Internal Medicine | Admitting: Internal Medicine

## 2021-08-29 DIAGNOSIS — Z1231 Encounter for screening mammogram for malignant neoplasm of breast: Secondary | ICD-10-CM | POA: Insufficient documentation

## 2021-08-30 DIAGNOSIS — N2581 Secondary hyperparathyroidism of renal origin: Secondary | ICD-10-CM | POA: Diagnosis not present

## 2021-08-30 DIAGNOSIS — N186 End stage renal disease: Secondary | ICD-10-CM | POA: Diagnosis not present

## 2021-08-30 DIAGNOSIS — Z992 Dependence on renal dialysis: Secondary | ICD-10-CM | POA: Diagnosis not present

## 2021-09-01 DIAGNOSIS — I1311 Hypertensive heart and chronic kidney disease without heart failure, with stage 5 chronic kidney disease, or end stage renal disease: Secondary | ICD-10-CM | POA: Diagnosis not present

## 2021-09-01 DIAGNOSIS — N2581 Secondary hyperparathyroidism of renal origin: Secondary | ICD-10-CM | POA: Diagnosis not present

## 2021-09-01 DIAGNOSIS — N186 End stage renal disease: Secondary | ICD-10-CM

## 2021-09-01 DIAGNOSIS — Z992 Dependence on renal dialysis: Secondary | ICD-10-CM | POA: Diagnosis not present

## 2021-09-04 DIAGNOSIS — Z992 Dependence on renal dialysis: Secondary | ICD-10-CM | POA: Diagnosis not present

## 2021-09-04 DIAGNOSIS — N186 End stage renal disease: Secondary | ICD-10-CM | POA: Diagnosis not present

## 2021-09-04 DIAGNOSIS — N2581 Secondary hyperparathyroidism of renal origin: Secondary | ICD-10-CM | POA: Diagnosis not present

## 2021-09-06 DIAGNOSIS — N186 End stage renal disease: Secondary | ICD-10-CM | POA: Diagnosis not present

## 2021-09-06 DIAGNOSIS — N2581 Secondary hyperparathyroidism of renal origin: Secondary | ICD-10-CM | POA: Diagnosis not present

## 2021-09-06 DIAGNOSIS — Z992 Dependence on renal dialysis: Secondary | ICD-10-CM | POA: Diagnosis not present

## 2021-09-08 DIAGNOSIS — N2581 Secondary hyperparathyroidism of renal origin: Secondary | ICD-10-CM | POA: Diagnosis not present

## 2021-09-08 DIAGNOSIS — Z992 Dependence on renal dialysis: Secondary | ICD-10-CM | POA: Diagnosis not present

## 2021-09-08 DIAGNOSIS — N186 End stage renal disease: Secondary | ICD-10-CM | POA: Diagnosis not present

## 2021-09-11 DIAGNOSIS — N186 End stage renal disease: Secondary | ICD-10-CM | POA: Diagnosis not present

## 2021-09-11 DIAGNOSIS — N2581 Secondary hyperparathyroidism of renal origin: Secondary | ICD-10-CM | POA: Diagnosis not present

## 2021-09-11 DIAGNOSIS — Z992 Dependence on renal dialysis: Secondary | ICD-10-CM | POA: Diagnosis not present

## 2021-09-13 DIAGNOSIS — N2581 Secondary hyperparathyroidism of renal origin: Secondary | ICD-10-CM | POA: Diagnosis not present

## 2021-09-13 DIAGNOSIS — Z992 Dependence on renal dialysis: Secondary | ICD-10-CM | POA: Diagnosis not present

## 2021-09-13 DIAGNOSIS — N186 End stage renal disease: Secondary | ICD-10-CM | POA: Diagnosis not present

## 2021-09-15 DIAGNOSIS — Z992 Dependence on renal dialysis: Secondary | ICD-10-CM | POA: Diagnosis not present

## 2021-09-15 DIAGNOSIS — N186 End stage renal disease: Secondary | ICD-10-CM | POA: Diagnosis not present

## 2021-09-15 DIAGNOSIS — N2581 Secondary hyperparathyroidism of renal origin: Secondary | ICD-10-CM | POA: Diagnosis not present

## 2021-09-18 DIAGNOSIS — N2581 Secondary hyperparathyroidism of renal origin: Secondary | ICD-10-CM | POA: Diagnosis not present

## 2021-09-18 DIAGNOSIS — Z992 Dependence on renal dialysis: Secondary | ICD-10-CM | POA: Diagnosis not present

## 2021-09-18 DIAGNOSIS — N186 End stage renal disease: Secondary | ICD-10-CM | POA: Diagnosis not present

## 2021-09-19 ENCOUNTER — Ambulatory Visit (INDEPENDENT_AMBULATORY_CARE_PROVIDER_SITE_OTHER): Payer: Medicare HMO

## 2021-09-19 DIAGNOSIS — I10 Essential (primary) hypertension: Secondary | ICD-10-CM

## 2021-09-19 DIAGNOSIS — N186 End stage renal disease: Secondary | ICD-10-CM

## 2021-09-19 DIAGNOSIS — G8929 Other chronic pain: Secondary | ICD-10-CM

## 2021-09-19 DIAGNOSIS — I5032 Chronic diastolic (congestive) heart failure: Secondary | ICD-10-CM

## 2021-09-19 NOTE — Patient Instructions (Signed)
Visit Information ? ?Thank you for taking time to visit with me today. Please don't hesitate to contact me if I can be of assistance to you before our next scheduled telephone appointment. ? ?Following are the goals we discussed today:  ?Attend all scheduled provider appointments  ?Call provider office for new concerns or questions  ?Check blood pressure and oxygen levels at least 2-3 times per week and record. write blood pressure results in a log or diary (take log to your appointments to share information with your provider) ?Continue to follow a low sodium/ DASH diet and adhere to fluid restriction as recommended by your doctor.   ?Weight at home at least 1-2 times per week and record  ?Elevate legs when sitting due to knee pain.  Apply ice for pain management.  ?Continue to take medications as prescribed and refill timely ? ?Our next appointment is by telephone on 11/23/21 at 1:00 pm ? ?Please call the care guide team at [phone] if you need to cancel or reschedule your appointment.  ? ?If you are experiencing a Mental Health or [place] or need someone to talk to, please call the Suicide and Crisis Lifeline: 988 ?call 1-800-273-TALK (toll free, 24 hour hotline)  ? ?Patient verbalizes understanding of instructions and care plan provided today and agrees to view in [place]. Active MyChart status confirmed with patient.   ? ?Quinn Plowman RN,BSN,CCM ?RN Case Manager ?[place]  ?[phone] ? ?

## 2021-09-19 NOTE — Chronic Care Management (AMB) (Signed)
?Chronic Care Management  ? ?CCM RN Visit Note ? ?09/19/2021 ?Name: Carrie [name] MRN: [id number] DOB: 1951-04-14 ? ?Subjective: ?Carrie [name] is a 71 y.o. year old female who is a primary care patient of Letvak, Theophilus Kinds, MD. The care management team was consulted for assistance with disease management and care coordination needs.   ? ?Engaged with patient by telephone for follow up visit in response to provider referral for case management and/or care coordination services.  ? ?Consent to Services:  ?The patient was given information about Chronic Care Management services, agreed to services, and gave verbal consent prior to initiation of services.  Please see initial visit note for detailed documentation.  ? ?Patient agreed to services and verbal consent obtained.  ? ?Assessment: Review of patient past medical history, allergies, medications, health status, including review of consultants reports, laboratory and other test data, was performed as part of comprehensive evaluation and provision of chronic care management services.  ? ?SDOH (Social Determinants of Health) assessments and interventions performed:   ? ?CCM Care Plan ? ?Allergies  ?Allergen Reactions  ? Naproxen Sodium Anaphylaxis and Swelling  ? Sulfamethoxazole-Trimethoprim Anaphylaxis and Swelling  ? ? ?Outpatient Encounter Medications as of 09/19/2021  ?Medication Sig  ? hydrALAZINE (APRESOLINE) 50 MG tablet Take 50 mg by mouth 2 times daily at 12 noon and 4 pm.  ? acetaminophen (TYLENOL) 500 MG tablet Take 1,000 mg by mouth every 6 (six) hours as needed for mild pain or moderate pain.  ? albuterol (VENTOLIN HFA) 108 (90 Base) MCG/ACT inhaler Inhale 2 puffs into the lungs every 6 (six) hours as needed for wheezing or shortness of breath.  ? amLODipine (NORVASC) 10 MG tablet Take 10 mg by mouth daily.  ? aspirin EC 81 MG tablet Take 81 mg by mouth daily.  ? atorvastatin (LIPITOR) 40 MG tablet TAKE 40 MG BY MOUTH ONCE DAILY (Patient taking  differently: Take 40 mg by mouth daily.)  ? b complex vitamins capsule Take 1 capsule by mouth daily.  ? doxycycline (VIBRAMYCIN) 100 MG capsule Take 1 capsule (100 mg total) by mouth 2 (two) times daily. (Patient not taking: Reported on 08/07/2021)  ? guaiFENesin-dextromethorphan (ROBITUSSIN DM) 100-10 MG/5ML syrup Take 5 mLs by mouth every 4 (four) hours as needed for cough (chest congestion). (Patient not taking: Reported on 08/07/2021)  ? HYDROcodone bit-homatropine (HYCODAN) 5-1.5 MG/5ML syrup Take 5 mLs by mouth at bedtime as needed for cough.  ? levETIRAcetam (KEPPRA) 1000 MG tablet Take 1,000 mg by mouth 2 (two) times daily.  ? lidocaine-prilocaine (EMLA) cream Apply 1 application topically every Monday, Wednesday, and Friday with hemodialysis.  ? LORazepam (ATIVAN) 0.5 MG tablet TAKE 1 TABLET BY MOUTH 2 TIMES DAILY AS NEEDED FOR ANXIETY.  ? losartan (COZAAR) 25 MG tablet Take 25-50 mg by mouth See admin instructions. Take 1 tablet (80m) by mouth daily on Tuesday, Thursday, Saturday and Sunday and take 2 tablets (52m by mouth daily on Monday, Wednesday and Friday before dialysis  ? metoprolol succinate (TOPROL-XL) 25 MG 24 hr tablet Take 25 mg by mouth every evening.  ? multivitamin (RENA-VIT) TABS tablet Take 1 tablet by mouth at bedtime.  ? neomycin-polymyxin-hydrocortisone (CORTISPORIN) 3.5-10000-1 OTIC suspension Place 4 drops into the right ear 4 (four) times daily.  ? omeprazole (PRILOSEC) 20 MG capsule Take 1 capsule (20 mg total) by mouth daily.  ? sevelamer carbonate (RENVELA) 800 MG tablet Take 800-1,600 mg by mouth See admin instructions. Take 2 tablets (1600 mg) by  mouth three times daily with meals and take 1 tablet (800 mg) by mouth twice daily with snacks  ? torsemide (DEMADEX) 20 MG tablet Take 20 mg by mouth 4 (four) times a week. (Morning of non-dialysis days)  ? ?No facility-administered encounter medications on file as of 09/19/2021.  ? ? ?Patient Active Problem List  ? Diagnosis Date Noted   ? Chronic diastolic heart failure ([place]) 05/17/2021  ? Seizure disorder ([place])   ? Hyperlipidemia   ? SOB (shortness of breath) 05/01/2021  ? Anemia 05/01/2021  ? Chronic hypoxemic respiratory failure ([place]) 05/01/2021  ? Positive FIT (fecal immunochemical test)   ? Polyp of ascending colon   ? Polyp of descending colon   ? Polyp of transverse colon   ? Dysphagia 02/07/2021  ? Tremor 12/01/2020  ? Headache 12/01/2020  ? Eustachian tube dysfunction 10/25/2020  ? History of shingles 09/26/2020  ? Primary osteoarthritis of left knee 08/22/2020  ? Complication from renal dialysis device 11/20/2018  ? ESRD on hemodialysis ([place]) 08/04/2018  ? Cough 10/01/2017  ? Status post total right knee replacement 04/30/2017  ? Unilateral primary osteoarthritis, right knee 03/18/2017  ? History of total hip replacement, left 03/18/2017  ? Unilateral primary osteoarthritis, left hip 03/27/2016  ? Status post total replacement of left hip 03/27/2016  ? Mood disorder ([place]) 04/01/2015  ? Osteoarthritis of right knee 02/03/2014  ? Advanced directives, counseling/discussion 02/03/2014  ? Routine general medical examination at a health care facility 07/08/2012  ? Obesity, Class III, BMI 40-49.9 (morbid obesity) ([place]) 07/08/2012  ? Type 2 diabetes, controlled, with renal manifestation ([place])   ? Otitis externa 03/20/2010  ? HYPERCHOLESTEROLEMIA 09/26/2006  ? Essential hypertension 09/26/2006  ? ALLERGIC RHINITIS 09/26/2006  ? History of meningioma 08/03/2006  ? ? ?Conditions to be addressed/monitored:CHF, HTN, ESRD, and Pain ? ?Care Plan : RN Case Manager Plan of care  ?Updates made by Dannielle Karvonen, RN since 09/19/2021 12:00 AM  ?  ? ?Problem: Knowledge deficit related to long term care plan for management of chronic conditions ( HTN, HF, ESRD)   ?Priority: High  ?  ? ?Long-Range Goal: Chronic conditions monitored and managed ( HTN, HF, ESRD   ?Start Date: 04/11/2021  ?Expected End Date: 12/01/2021  ?Priority: High  ?Note:   ?Current Barriers:   ?Knowledge Deficits related to plan of care for management of CHF, HTN, and ESRD  ?Chronic Disease Management support and education needs related to CHF, HTN, and ESRD ?Patient states additional blood pressure medication hydralazine was added. She reports this mornings blood pressure reading was 172/78.  Patient states she has not been checking her weight at home regularly due to it being monitored in dialysis. She states she can tell her weight is dropping because of how her clothes are fitting her.   Patient reports next follow up visit with the vascular doctor is 10/26/21 and annual wellness visit is 02/20/22.    ?RNCM Clinical Goal(s):  ?Patient will verbalize basic understanding of CHF, HTN, and ESRD disease process and self health management plan as evidenced by weighing at least 3 times per week, taking medications as prescribed, monitoring blood pressure at least 2-3 days per week and recording.  ?take all medications exactly as prescribed and will call provider for medication related questions as evidenced by      ?attend all scheduled medical appointments:   as evidenced by patient reporting they have attended appointments.         ?continue to work with  RN Care Manager and/or Social Worker to address care management and care coordination needs related to CHF, HTN, and ESRD as evidenced by adherence to CM Team Scheduled appointments     through collaboration with RN Care manager, provider, and care team.  ? ?Interventions: ?1:1 collaboration with primary care provider regarding development and update of comprehensive plan of care as evidenced by provider attestation and co-signature ?Inter-disciplinary care team collaboration (see longitudinal plan of care) ?Evaluation of current treatment plan related to  self management and patient's adherence to plan as established by provider ? ?Hypertension Interventions: Goal on track:  Long term  ?Last practice recorded BP readings:  ?BP Readings from Last 3  Encounters:  ?07/27/21 (!) 163/85  ?06/07/21 128/74  ?05/17/21 140/90  ?Most recent eGFR/CrCl: No results found for: EGFR  No components found for: CRCL ? ?Evaluation of current treatment plan related to hypertens

## 2021-09-20 DIAGNOSIS — Z992 Dependence on renal dialysis: Secondary | ICD-10-CM | POA: Diagnosis not present

## 2021-09-20 DIAGNOSIS — N2581 Secondary hyperparathyroidism of renal origin: Secondary | ICD-10-CM | POA: Diagnosis not present

## 2021-09-20 DIAGNOSIS — N186 End stage renal disease: Secondary | ICD-10-CM | POA: Diagnosis not present

## 2021-09-22 DIAGNOSIS — N2581 Secondary hyperparathyroidism of renal origin: Secondary | ICD-10-CM | POA: Diagnosis not present

## 2021-09-22 DIAGNOSIS — Z992 Dependence on renal dialysis: Secondary | ICD-10-CM | POA: Diagnosis not present

## 2021-09-22 DIAGNOSIS — N186 End stage renal disease: Secondary | ICD-10-CM | POA: Diagnosis not present

## 2021-09-25 DIAGNOSIS — N186 End stage renal disease: Secondary | ICD-10-CM | POA: Diagnosis not present

## 2021-09-25 DIAGNOSIS — Z992 Dependence on renal dialysis: Secondary | ICD-10-CM | POA: Diagnosis not present

## 2021-09-25 DIAGNOSIS — N2581 Secondary hyperparathyroidism of renal origin: Secondary | ICD-10-CM | POA: Diagnosis not present

## 2021-09-25 DIAGNOSIS — E1122 Type 2 diabetes mellitus with diabetic chronic kidney disease: Secondary | ICD-10-CM | POA: Diagnosis not present

## 2021-09-27 DIAGNOSIS — Z992 Dependence on renal dialysis: Secondary | ICD-10-CM | POA: Diagnosis not present

## 2021-09-27 DIAGNOSIS — N2581 Secondary hyperparathyroidism of renal origin: Secondary | ICD-10-CM | POA: Diagnosis not present

## 2021-09-27 DIAGNOSIS — N186 End stage renal disease: Secondary | ICD-10-CM | POA: Diagnosis not present

## 2021-09-29 DIAGNOSIS — Z992 Dependence on renal dialysis: Secondary | ICD-10-CM | POA: Diagnosis not present

## 2021-09-29 DIAGNOSIS — N2581 Secondary hyperparathyroidism of renal origin: Secondary | ICD-10-CM | POA: Diagnosis not present

## 2021-09-29 DIAGNOSIS — N186 End stage renal disease: Secondary | ICD-10-CM | POA: Diagnosis not present

## 2021-10-01 DIAGNOSIS — N186 End stage renal disease: Secondary | ICD-10-CM | POA: Diagnosis not present

## 2021-10-01 DIAGNOSIS — Z992 Dependence on renal dialysis: Secondary | ICD-10-CM | POA: Diagnosis not present

## 2021-10-01 DIAGNOSIS — I132 Hypertensive heart and chronic kidney disease with heart failure and with stage 5 chronic kidney disease, or end stage renal disease: Secondary | ICD-10-CM

## 2021-10-01 DIAGNOSIS — I509 Heart failure, unspecified: Secondary | ICD-10-CM

## 2021-10-02 DIAGNOSIS — N2581 Secondary hyperparathyroidism of renal origin: Secondary | ICD-10-CM | POA: Diagnosis not present

## 2021-10-02 DIAGNOSIS — N186 End stage renal disease: Secondary | ICD-10-CM | POA: Diagnosis not present

## 2021-10-02 DIAGNOSIS — Z992 Dependence on renal dialysis: Secondary | ICD-10-CM | POA: Diagnosis not present

## 2021-10-04 DIAGNOSIS — N2581 Secondary hyperparathyroidism of renal origin: Secondary | ICD-10-CM | POA: Diagnosis not present

## 2021-10-04 DIAGNOSIS — N186 End stage renal disease: Secondary | ICD-10-CM | POA: Diagnosis not present

## 2021-10-04 DIAGNOSIS — Z992 Dependence on renal dialysis: Secondary | ICD-10-CM | POA: Diagnosis not present

## 2021-10-06 DIAGNOSIS — Z992 Dependence on renal dialysis: Secondary | ICD-10-CM | POA: Diagnosis not present

## 2021-10-06 DIAGNOSIS — N2581 Secondary hyperparathyroidism of renal origin: Secondary | ICD-10-CM | POA: Diagnosis not present

## 2021-10-06 DIAGNOSIS — N186 End stage renal disease: Secondary | ICD-10-CM | POA: Diagnosis not present

## 2021-10-09 DIAGNOSIS — N2581 Secondary hyperparathyroidism of renal origin: Secondary | ICD-10-CM | POA: Diagnosis not present

## 2021-10-09 DIAGNOSIS — N186 End stage renal disease: Secondary | ICD-10-CM | POA: Diagnosis not present

## 2021-10-09 DIAGNOSIS — Z992 Dependence on renal dialysis: Secondary | ICD-10-CM | POA: Diagnosis not present

## 2021-10-11 DIAGNOSIS — N2581 Secondary hyperparathyroidism of renal origin: Secondary | ICD-10-CM | POA: Diagnosis not present

## 2021-10-11 DIAGNOSIS — Z992 Dependence on renal dialysis: Secondary | ICD-10-CM | POA: Diagnosis not present

## 2021-10-11 DIAGNOSIS — N186 End stage renal disease: Secondary | ICD-10-CM | POA: Diagnosis not present

## 2021-10-12 ENCOUNTER — Other Ambulatory Visit: Payer: Self-pay | Admitting: Internal Medicine

## 2021-10-12 NOTE — Telephone Encounter (Signed)
Last filled 08-22-21 #60 ?Last OV 06-07-21 ?Next OV 02-20-22 ?[facility] ?

## 2021-10-13 DIAGNOSIS — Z992 Dependence on renal dialysis: Secondary | ICD-10-CM | POA: Diagnosis not present

## 2021-10-13 DIAGNOSIS — N186 End stage renal disease: Secondary | ICD-10-CM | POA: Diagnosis not present

## 2021-10-13 DIAGNOSIS — N2581 Secondary hyperparathyroidism of renal origin: Secondary | ICD-10-CM | POA: Diagnosis not present

## 2021-10-16 DIAGNOSIS — Z992 Dependence on renal dialysis: Secondary | ICD-10-CM | POA: Diagnosis not present

## 2021-10-16 DIAGNOSIS — N186 End stage renal disease: Secondary | ICD-10-CM | POA: Diagnosis not present

## 2021-10-16 DIAGNOSIS — N2581 Secondary hyperparathyroidism of renal origin: Secondary | ICD-10-CM | POA: Diagnosis not present

## 2021-10-17 ENCOUNTER — Ambulatory Visit (INDEPENDENT_AMBULATORY_CARE_PROVIDER_SITE_OTHER): Payer: Medicare HMO | Admitting: Nurse Practitioner

## 2021-10-17 ENCOUNTER — Encounter: Payer: Self-pay | Admitting: Nurse Practitioner

## 2021-10-17 VITALS — BP 158/90 | Ht 63.0 in | Wt 221.5 lb

## 2021-10-17 DIAGNOSIS — N186 End stage renal disease: Secondary | ICD-10-CM | POA: Diagnosis not present

## 2021-10-17 DIAGNOSIS — R072 Precordial pain: Secondary | ICD-10-CM

## 2021-10-17 DIAGNOSIS — I5032 Chronic diastolic (congestive) heart failure: Secondary | ICD-10-CM | POA: Diagnosis not present

## 2021-10-17 DIAGNOSIS — Z992 Dependence on renal dialysis: Secondary | ICD-10-CM | POA: Diagnosis not present

## 2021-10-17 DIAGNOSIS — E1121 Type 2 diabetes mellitus with diabetic nephropathy: Secondary | ICD-10-CM | POA: Diagnosis not present

## 2021-10-17 DIAGNOSIS — I248 Other forms of acute ischemic heart disease: Secondary | ICD-10-CM

## 2021-10-17 DIAGNOSIS — E782 Mixed hyperlipidemia: Secondary | ICD-10-CM

## 2021-10-17 MED ORDER — LOSARTAN POTASSIUM 25 MG PO TABS: 25.0000 mg | ORAL_TABLET | ORAL | 3 refills | Status: DC

## 2021-10-17 MED ORDER — CARVEDILOL 12.5 MG PO TABS: 12.5000 mg | ORAL_TABLET | Freq: Two times a day (BID) | ORAL | 3 refills | Status: DC

## 2021-10-17 NOTE — Progress Notes (Signed)
? ? ?Office Visit  ?  ?Patient Name: Carrie [name] ?Date of Encounter: 10/17/2021 ? ?Primary Care Provider:  Venia Carbon, MD ?Primary Cardiologist:  Ida Rogue, MD ? ?Chief Complaint  ?  ?71 year old female with a history of HFpEF, hypertension, hyperlipidemia, type 2 diabetes mellitus, anemia, end-stage renal disease, obesity, seizures, and meningioma, who presents for follow-up of HFpEF. ? ?Past Medical History  ?  ?Past Medical History:  ?Diagnosis Date  ? Allergy   ? Anemia   ? Arthritis   ? "right knee" (10/26'/2017)  ? Chronic heart failure with preserved ejection fraction (HFpEF) ([place])   ? a. 04/2021 Echo: EF >55%, GrI DD, nl RV fxn, triv MR.  ? Depression   ? ESRD (end stage renal disease) ([place])   ? a. OnHD (Dr. Lavonia Dana)  ? History of blood transfusion 2008  ? "when I had brain tumor surgery"  ? History of MRSA infection 2008  ? History of stress test   ? a. 07/2018 MV: EF 66%, no ischemia/infarct.  ? Hypertension   ? Meningioma ([place])   ? Foramen magnum  ? Type II diabetes mellitus ([place]) 2011  ? on metformin until yr ago- no med now - off due to kidneys  ? ?Past Surgical History:  ?Procedure Laterality Date  ? A/V FISTULAGRAM Left 03/10/2019  ? Procedure: A/V FISTULAGRAM;  Surgeon: Katha Cabal, MD;  Location: [place] [facility];  Service: Cardiovascular;  Laterality: Left;  ? A/V FISTULAGRAM Left 07/14/2019  ? Procedure: A/V FISTULAGRAM;  Surgeon: Katha Cabal, MD;  Location: [place] [facility];  Service: Cardiovascular;  Laterality: Left;  ? A/V FISTULAGRAM Left 01/07/2020  ? Procedure: A/V FISTULAGRAM;  Surgeon: Katha Cabal, MD;  Location: [place] [facility];  Service: Cardiovascular;  Laterality: Left;  ? AV FISTULA PLACEMENT Left 11/07/2018  ? Procedure: ARTERIOVENOUS (AV) FISTULA CREATION ( BRACHIO BASILIC ) VS BRACHIAL AXILLARY GRAFT;  Surgeon: Katha Cabal, MD;  Location: [facility];  Service: Vascular;  Laterality: Left;  ? BRAIN MENINGIOMA EXCISION  08/2006   ? Foramina magnum meningioma  ? CERVICAL DISCECTOMY  1996  ? Dr. Alric Seton  ? [place]  ? COLONOSCOPY    ? COLONOSCOPY WITH PROPOFOL N/A 03/07/2021  ? Procedure: COLONOSCOPY WITH PROPOFOL;  Surgeon: Virgel Manifold, MD;  Location: [facility];  Service: Endoscopy;  Laterality: N/A;  ? DIALYSIS/PERMA CATHETER INSERTION N/A 08/04/2018  ? Procedure: DIALYSIS/PERMA CATHETER INSERTION;  Surgeon: Algernon Huxley, MD;  Location: [place] [facility];  Service: Cardiovascular;  Laterality: N/A;  ? DIALYSIS/PERMA CATHETER REMOVAL N/A 12/25/2018  ? Procedure: DIALYSIS/PERMA CATHETER REMOVAL;  Surgeon: Algernon Huxley, MD;  Location: [place] [facility];  Service: Cardiovascular;  Laterality: N/A;  ? DILATION AND CURETTAGE OF UTERUS    ? JOINT REPLACEMENT    ? right knee left hip  ? Right wrist x-ray  2007  ? Deg. at 1st MCP  ? TOTAL HIP ARTHROPLASTY Left 03/27/2016  ? Procedure: LEFT TOTAL HIP ARTHROPLASTY ANTERIOR APPROACH;  Surgeon: Mcarthur Rossetti, MD;  Location: [place];  Service: Orthopedics;  Laterality: Left;  ? TOTAL KNEE ARTHROPLASTY Right 04/30/2017  ? Procedure: RIGHT TOTAL KNEE ARTHROPLASTY;  Surgeon: Mcarthur Rossetti, MD;  Location: [place];  Service: Orthopedics;  Laterality: Right;  ? TUBAL LIGATION  1981  ? ? ?Allergies ? ?Allergies  ?Allergen Reactions  ? Naproxen Sodium Anaphylaxis and Swelling  ? Sulfamethoxazole-Trimethoprim Anaphylaxis and Swelling  ? ? ?  History of Present Illness  ?  ?71 year old female with the above complex past medical history including HFpEF, hypertension, hyperlipidemia, type 2 diabetes mellitus, anemia, end-stage renal disease, obesity, meningioma, and seizures.  Previous echocardiogram in May 2019 showed an EF of 60 to 65% with grade 1 diastolic dysfunction, and mild aortic stenosis.  She previously underwent stress testing in February 2020, which was low risk without ischemia or infarct.  She was last seen in cardiology clinic in May 2021, at which time she  was doing reasonably well and tolerating dialysis.  In late November 2022, she was admitted to [facility] with dyspnea and volume overload.  VQ scan was low probability for PE and lower extremity ultrasounds were negative for DVT.  She was dialyzed with improvement in volume status.  Echocardiogram during admission showed an EF of greater than 55% with grade 1 diastolic dysfunction, normal RV function, and trivial MR. During hospitalization, high-sensitivity troponin did elevate to 59  73. ? ?Since her hospitalization, Ms. Mandich has done reasonably well.  She notes chronic dyspnea on exertion after walking only 20 or 30 feet.  She initially said that she does not experience chest pain however, on further questioning, she notes occasional pressure in her chest for anywhere between a few minutes to 20 minutes.  She cannot really say how often it occurs but it is rare.  It last occurred maybe about 3 weeks ago when she was doing something in her home.  She is never given it much thought.  She generally tolerates dialysis well.  She notes that on dialysis days, her blood pressure actually runs higher despite taking more losartan than on nondialysis days.  In that setting, she has been taking 50 mg of losartan on both dialysis and nondialysis days over the past few weeks.  Pressures typically run in the 150s to 180s.  She denies palpitations, PND, orthopnea, dizziness, syncope, edema, or early satiety. ? ?Home Medications  ?  ?Current Outpatient Medications  ?Medication Sig Dispense Refill  ? acetaminophen (TYLENOL) 500 MG tablet Take 1,000 mg by mouth every 6 (six) hours as needed for mild pain or moderate pain.    ? albuterol (VENTOLIN HFA) 108 (90 Base) MCG/ACT inhaler Inhale 2 puffs into the lungs every 6 (six) hours as needed for wheezing or shortness of breath. 8 g 2  ? amLODipine (NORVASC) 10 MG tablet Take 10 mg by mouth daily.    ? aspirin EC 81 MG tablet Take 81 mg by mouth daily.    ? atorvastatin  (LIPITOR) 40 MG tablet TAKE 40 MG BY MOUTH ONCE DAILY (Patient taking differently: Take 40 mg by mouth daily.) 90 tablet 3  ? b complex vitamins capsule Take 1 capsule by mouth daily.    ? carvedilol (COREG) 12.5 MG tablet Take 1 tablet (12.5 mg total) by mouth 2 (two) times daily. 180 tablet 3  ? levETIRAcetam (KEPPRA) 1000 MG tablet Take 1,000 mg by mouth 2 (two) times daily.    ? lidocaine-prilocaine (EMLA) cream Apply 1 application topically every Monday, Wednesday, and Friday with hemodialysis.    ? LORazepam (ATIVAN) 0.5 MG tablet TAKE 1 TABLET BY MOUTH TWICE A DAY AS NEEDED FOR ANXIETY 60 tablet 0  ? multivitamin (RENA-VIT) TABS tablet Take 1 tablet by mouth at bedtime. 30 tablet 0  ? omeprazole (PRILOSEC) 20 MG capsule Take 1 capsule (20 mg total) by mouth daily. 90 capsule 3  ? sevelamer carbonate (RENVELA) 800 MG tablet Take 800-1,600 mg by  mouth See admin instructions. Take 2 tablets (1600 mg) by mouth three times daily with meals and take 1 tablet (800 mg) by mouth twice daily with snacks    ? torsemide (DEMADEX) 20 MG tablet Take 20 mg by mouth 4 (four) times a week. (Morning of non-dialysis days)    ? losartan (COZAAR) 25 MG tablet Take 1-2 tablets (25-50 mg total) by mouth See admin instructions. Take 1 tablet ('25mg'$ ) by mouth daily on Tuesday, Thursday, Saturday and Sunday and take 2 tablets ('50mg'$ ) by mouth daily on Monday, Wednesday and Friday before dialysis 90 tablet 3  ? ?No current facility-administered medications for this visit.  ?  ? ?Review of Systems  ?  ?Chronic dyspnea on exertion.  Occasional pressure in her chest.  She denies palpitations, PND, orthopnea, dizziness, syncope, edema, or early satiety.  All other systems reviewed and are otherwise negative except as noted above. ?  ? ?Physical Exam  ?  ?VS:  BP (!) 158/90 (BP Location: Right Arm, Patient Position: Sitting, Cuff Size: Normal)   Ht '5\' 3"'$  (1.6 m)   Wt 221 lb 8 oz (100.5 kg)   SpO2 96%   BMI 39.24 kg/m?  , BMI Body mass  index is 39.24 kg/m?. ?    ?GEN: Well nourished, well developed, in no acute distress. ?HEENT: normal. ?Neck: Supple, no JVD, carotid bruits, or masses. ?Cardiac: RRR, 2/6 systolic murmur at the upper sternal bo

## 2021-10-17 NOTE — Patient Instructions (Signed)
Medication Instructions:  ?Your physician has recommended you make the following change in your medication:  ? ?STOP Metoprolol ?START Carvediolol 12.5 mg twice daily  ? ?*If you need a refill on your cardiac medications before your next appointment, please call your pharmacy* ? ? ?Lab Work: ?None ? ?If you have labs (blood work) drawn today and your tests are completely normal, you will receive your results only by: ?MyChart Message (if you have MyChart) OR ?A paper copy in the mail ?If you have any lab test that is abnormal or we need to change your treatment, we will call you to review the results. ? ? ?Testing/Procedures: ?[facility] MYOVIEW ? ?Your caregiver has ordered a Stress Test with nuclear imaging. The purpose of this test is to evaluate the blood supply to your heart muscle. This procedure is referred to as a "Non-Invasive Stress Test." This is because other than having an IV started in your vein, nothing is inserted or "invades" your body. Cardiac stress tests are done to find areas of poor blood flow to the heart by determining the extent of coronary artery disease (CAD). Some patients exercise on a treadmill, which naturally increases the blood flow to your heart, while others who are  unable to walk on a treadmill due to physical limitations have a pharmacologic/chemical stress agent called Lexiscan . This medicine will mimic walking on a treadmill by temporarily increasing your coronary blood flow.  ? ?Please note: these test may take anywhere between 2-4 hours to complete ? ?PLEASE REPORT TO [facility]ICAL [facility] ENTRANCE  ?THE VOLUNTEERS AT THE FIRST DESK WILL DIRECT YOU WHERE TO GO ? ?Date of Procedure:____________________ ? ?Arrival Time for Procedure:_________________________ ? ? ?PLEASE NOTIFY THE OFFICE AT LEAST 24 HOURS IN ADVANCE IF YOU ARE UNABLE TO KEEP YOUR APPOINTMENT.  [phone] ?AND  ?PLEASE NOTIFY NUCLEAR MEDICINE AT [facility] AT LEAST 78 HOURS IN ADVANCE IF YOU ARE UNABLE TO KEEP YOUR  APPOINTMENT. [phone] ? ?How to prepare for your Myoview test: ? ?Do not eat or drink after midnight ?No caffeine for 24 hours prior to test ?No smoking 24 hours prior to test. ?Your medication may be taken with water.  If your doctor stopped a medication because of this test, do not take that medication. ?Ladies, please do not wear dresses.  Skirts or pants are appropriate. Please wear a short sleeve shirt. ?No perfume, cologne or lotion. ?Wear comfortable walking shoes. No heels! ? ? ? ?Follow-Up: ?At [facility], you and your health needs are our priority.  As part of our continuing mission to provide you with exceptional heart care, we have created designated Provider Care Teams.  These Care Teams include your primary Cardiologist (physician) and Advanced Practice Providers (APPs -  Physician Assistants and Nurse Practitioners) who all work together to provide you with the care you need, when you need it. ? ? ? ?Your next appointment:   ?4 week(s) ? ?The format for your next appointment:   ?In Person ? ?Provider:   ?Ida Rogue, MD  ? ? ? ? ?Important Information About Sugar ? ? ? ? ?  ?

## 2021-10-18 DIAGNOSIS — N2581 Secondary hyperparathyroidism of renal origin: Secondary | ICD-10-CM | POA: Diagnosis not present

## 2021-10-18 DIAGNOSIS — Z992 Dependence on renal dialysis: Secondary | ICD-10-CM | POA: Diagnosis not present

## 2021-10-18 DIAGNOSIS — N186 End stage renal disease: Secondary | ICD-10-CM | POA: Diagnosis not present

## 2021-10-20 DIAGNOSIS — N186 End stage renal disease: Secondary | ICD-10-CM | POA: Diagnosis not present

## 2021-10-20 DIAGNOSIS — Z992 Dependence on renal dialysis: Secondary | ICD-10-CM | POA: Diagnosis not present

## 2021-10-20 DIAGNOSIS — N2581 Secondary hyperparathyroidism of renal origin: Secondary | ICD-10-CM | POA: Diagnosis not present

## 2021-10-23 DIAGNOSIS — N186 End stage renal disease: Secondary | ICD-10-CM | POA: Diagnosis not present

## 2021-10-23 DIAGNOSIS — Z992 Dependence on renal dialysis: Secondary | ICD-10-CM | POA: Diagnosis not present

## 2021-10-23 DIAGNOSIS — N2581 Secondary hyperparathyroidism of renal origin: Secondary | ICD-10-CM | POA: Diagnosis not present

## 2021-10-24 ENCOUNTER — Encounter
Admission: RE | Admit: 2021-10-24 | Discharge: 2021-10-24 | Disposition: A | Discharge status: Home / Self Care | Payer: Medicare HMO | Source: Ambulatory Visit | Attending: Nurse Practitioner | Admitting: Nurse Practitioner

## 2021-10-24 DIAGNOSIS — R072 Precordial pain: Secondary | ICD-10-CM | POA: Diagnosis not present

## 2021-10-24 LAB — NM MYOCAR MULTI W/SPECT W/WALL MOTION / EF
LV dias vol: 69 mL (ref 46–106)
LV sys vol: 36 mL
Nuc Stress EF: 66 %
Peak HR: 94 {beats}/min
Percent HR: 63 %
Rest HR: 81 {beats}/min
Rest Nuclear Isotope Dose: 10 mCi
SDS: 6
SRS: 0
SSS: 6
ST Depression (mm): 0 mm
Stress Nuclear Isotope Dose: 31.9 mCi
TID: 1.06

## 2021-10-24 MED ORDER — REGADENOSON 0.4 MG/5ML IV SOLN
0.4000 mg | Freq: Once | INTRAVENOUS | Status: AC
  Administered 2021-10-24: 0.4 mg via INTRAVENOUS

## 2021-10-24 MED ORDER — TECHNETIUM TC 99M TETROFOSMIN IV KIT
10.0000 | PACK | Freq: Once | INTRAVENOUS | Status: AC
  Administered 2021-10-24: 10.03 via INTRAVENOUS

## 2021-10-24 MED ORDER — TECHNETIUM TC 99M TETROFOSMIN IV KIT
31.8700 | PACK | Freq: Once | INTRAVENOUS | Status: AC | PRN
  Administered 2021-10-24: 31.87 via INTRAVENOUS

## 2021-10-25 ENCOUNTER — Telehealth: Payer: Self-pay | Admitting: Nurse Practitioner

## 2021-10-25 ENCOUNTER — Other Ambulatory Visit (INDEPENDENT_AMBULATORY_CARE_PROVIDER_SITE_OTHER): Payer: Self-pay | Admitting: Nurse Practitioner

## 2021-10-25 DIAGNOSIS — N2581 Secondary hyperparathyroidism of renal origin: Secondary | ICD-10-CM | POA: Diagnosis not present

## 2021-10-25 DIAGNOSIS — I3139 Other pericardial effusion (noninflammatory): Secondary | ICD-10-CM

## 2021-10-25 DIAGNOSIS — N186 End stage renal disease: Secondary | ICD-10-CM | POA: Diagnosis not present

## 2021-10-25 DIAGNOSIS — Z992 Dependence on renal dialysis: Secondary | ICD-10-CM | POA: Diagnosis not present

## 2021-10-25 NOTE — Telephone Encounter (Signed)
Theora Gianotti, NP  10/24/2021  5:35 PM EDT     Low risk stress test.  There is a small area possible poor blood flow to the heart muscle though based on imaging, this is felt more likely to represent artifact.  No significant coronary calcifications.  Small areas of fluid collection around the heart felt to be most likely related to the pericardial effusion.  There is no effusion noted on echocardiogram in November 2022.  I recommend a limited echo to reassess for pericardial effusion, as it presents could contribute to dyspnea on exertion.

## 2021-10-25 NOTE — Telephone Encounter (Signed)
I spoke with the patient regarding her stress test results. She is advised of Ignacia Bayley, NP's recommendations to proceed with a limited echocardiogram to further evaluate the pericardial effusion that was noted on her stress test and could be contributing to some of her DOE.   The patient voices understanding of these results and is agreeable with proceeding with a limited echocardiogram.   She is aware I will place the order for this and scheduling will reach out to her to arrange a day/ time for her to come to our office to have this done. - she is also aware there are no special instructions prior to this test.

## 2021-10-26 ENCOUNTER — Encounter (INDEPENDENT_AMBULATORY_CARE_PROVIDER_SITE_OTHER): Payer: Self-pay | Admitting: Nurse Practitioner

## 2021-10-26 ENCOUNTER — Ambulatory Visit (INDEPENDENT_AMBULATORY_CARE_PROVIDER_SITE_OTHER): Payer: Medicare HMO

## 2021-10-26 ENCOUNTER — Ambulatory Visit (INDEPENDENT_AMBULATORY_CARE_PROVIDER_SITE_OTHER): Payer: Medicare HMO | Admitting: Nurse Practitioner

## 2021-10-26 VITALS — BP 123/65 | HR 82 | Resp 16 | Ht 63.0 in | Wt 220.0 lb

## 2021-10-26 DIAGNOSIS — N186 End stage renal disease: Secondary | ICD-10-CM

## 2021-10-26 DIAGNOSIS — E785 Hyperlipidemia, unspecified: Secondary | ICD-10-CM | POA: Diagnosis not present

## 2021-10-26 DIAGNOSIS — E1121 Type 2 diabetes mellitus with diabetic nephropathy: Secondary | ICD-10-CM

## 2021-10-27 ENCOUNTER — Telehealth (INDEPENDENT_AMBULATORY_CARE_PROVIDER_SITE_OTHER): Payer: Self-pay

## 2021-10-27 DIAGNOSIS — N186 End stage renal disease: Secondary | ICD-10-CM | POA: Diagnosis not present

## 2021-10-27 DIAGNOSIS — N2581 Secondary hyperparathyroidism of renal origin: Secondary | ICD-10-CM | POA: Diagnosis not present

## 2021-10-27 DIAGNOSIS — Z992 Dependence on renal dialysis: Secondary | ICD-10-CM | POA: Diagnosis not present

## 2021-10-27 NOTE — Telephone Encounter (Signed)
Spoke with the patient and she is scheduled with Dr. Delana Meyer for a LUE fistulagram on 11/14/21 with a 6:45 am arrival time to the MM. Pre-procedure instructions were discussed and will be mailed.

## 2021-10-30 DIAGNOSIS — N186 End stage renal disease: Secondary | ICD-10-CM | POA: Diagnosis not present

## 2021-10-30 DIAGNOSIS — Z992 Dependence on renal dialysis: Secondary | ICD-10-CM | POA: Diagnosis not present

## 2021-10-30 DIAGNOSIS — N2581 Secondary hyperparathyroidism of renal origin: Secondary | ICD-10-CM | POA: Diagnosis not present

## 2021-11-01 DIAGNOSIS — N2581 Secondary hyperparathyroidism of renal origin: Secondary | ICD-10-CM | POA: Diagnosis not present

## 2021-11-01 DIAGNOSIS — Z992 Dependence on renal dialysis: Secondary | ICD-10-CM | POA: Diagnosis not present

## 2021-11-01 DIAGNOSIS — N186 End stage renal disease: Secondary | ICD-10-CM | POA: Diagnosis not present

## 2021-11-02 DIAGNOSIS — N186 End stage renal disease: Secondary | ICD-10-CM | POA: Diagnosis not present

## 2021-11-02 DIAGNOSIS — N2581 Secondary hyperparathyroidism of renal origin: Secondary | ICD-10-CM | POA: Diagnosis not present

## 2021-11-02 DIAGNOSIS — Z992 Dependence on renal dialysis: Secondary | ICD-10-CM | POA: Diagnosis not present

## 2021-11-03 DIAGNOSIS — Z992 Dependence on renal dialysis: Secondary | ICD-10-CM | POA: Diagnosis not present

## 2021-11-03 DIAGNOSIS — N186 End stage renal disease: Secondary | ICD-10-CM | POA: Diagnosis not present

## 2021-11-03 DIAGNOSIS — N2581 Secondary hyperparathyroidism of renal origin: Secondary | ICD-10-CM | POA: Diagnosis not present

## 2021-11-04 ENCOUNTER — Encounter (INDEPENDENT_AMBULATORY_CARE_PROVIDER_SITE_OTHER): Payer: Self-pay | Admitting: Nurse Practitioner

## 2021-11-04 NOTE — Progress Notes (Signed)
Subjective:    Patient ID: Carrie [name], female    DOB: 07-22-1950, 71 y.o.   MRN: [id number] No chief complaint on file.   The patient returns to the office for follow up regarding a problem with their dialysis access.   The patient notes a significant increase in bleeding time after decannulation.     The patient denies hand pain or other symptoms consistent with steal phenomena.  No significant arm swelling.  The patient denies redness or swelling at the access site. The patient denies fever or chills at home or while on dialysis.  No recent shortening of the patient's walking distance or new symptoms consistent with claudication.  No history of rest pain symptoms. No new ulcers or wounds of the lower extremities have occurred.  The patient denies amaurosis fugax or recent TIA symptoms. There are no recent neurological changes noted. There is no history of DVT, PE or superficial thrombophlebitis. No recent episodes of angina or shortness of breath documented.   Duplex ultrasound of the AV access shows a patent access.   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  Was noted to have a shoulder multiple sticks in the mid graft.   Review of Systems  Hematological:  Bruises/bleeds easily.  All other systems reviewed and are negative.     Objective:   Physical Exam Vitals reviewed.  HENT:     Head: Normocephalic.  Cardiovascular:     Rate and Rhythm: Normal rate.     Pulses:          Radial pulses are 1+ on the right side and 1+ on the left side.  Pulmonary:     Effort: Pulmonary effort is normal.  Skin:    General: Skin is warm and dry.  Neurological:     Mental Status: She is alert and oriented to person, place, and time.  Psychiatric:        Mood and Affect: Mood normal.        Behavior: Behavior normal.        Thought Content: Thought content normal.        Judgment: Judgment  normal.    BP 123/65 (BP Location: Right Arm)   Pulse 82   Resp 16   Ht '5\' 3"'$  (1.6 m)   Wt 220 lb (99.8 kg)   BMI 38.97 kg/m   Past Medical History:  Diagnosis Date   Allergy    Anemia    Arthritis    "right knee" (10/26'/2017)   Chronic heart failure with preserved ejection fraction (HFpEF) ([place])    a. 04/2021 Echo: EF >55%, GrI DD, nl RV fxn, triv MR.   Depression    ESRD (end stage renal disease) ([place])    a. OnHD (Dr. Lavonia Dana)   History of blood transfusion 2008   "when I had brain tumor surgery"   History of MRSA infection 2008   History of stress test    a. 07/2018 MV: EF 66%, no ischemia/infarct.   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      No living will   Requests niece, [name], as health care POA   Would accept resuscitation attempts   No tube feeds if cognitively aware   Social Determinants of Health   Financial Resource Strain: Low Risk    Difficulty of Paying Living Expenses: Not hard at all  Food Insecurity: No Food Insecurity   Worried About Charity fundraiser in the Last Year: Never true   [place] in the Last Year: Never true  Transportation Needs: No Transportation  Needs   Lack of Transportation (Medical): No   Lack of Transportation (Non-Medical): No  Physical Activity: Not on file  Stress: Not on file  Social Connections: Not on file  Intimate Partner Violence: Not on file    Past Surgical History:  Procedure Laterality Date   A/V FISTULAGRAM Left 03/10/2019   Procedure: A/V FISTULAGRAM;  Surgeon: Katha Cabal, MD;  Location: [place] [facility];  Service: Cardiovascular;  Laterality: Left;   A/V FISTULAGRAM Left 07/14/2019   Procedure: A/V FISTULAGRAM;  Surgeon: Katha Cabal, MD;  Location: [place] [facility];  Service: Cardiovascular;  Laterality: Left;   A/V FISTULAGRAM Left 01/07/2020   Procedure: A/V FISTULAGRAM;  Surgeon: Katha Cabal, MD;  Location: [place] [facility];  Service: Cardiovascular;  Laterality: Left;   AV FISTULA PLACEMENT Left 11/07/2018   Procedure: ARTERIOVENOUS (AV) FISTULA CREATION ( BRACHIO BASILIC ) VS BRACHIAL AXILLARY GRAFT;  Surgeon: Katha Cabal, MD;  Location: [facility];  Service: Vascular;  Laterality: Left;   BRAIN MENINGIOMA EXCISION  08/2006   Foramina magnum meningioma   CERVICAL DISCECTOMY  1996   Dr. Alric Seton   CESAREAN SECTION  1981   COLONOSCOPY     COLONOSCOPY WITH PROPOFOL N/A 03/07/2021   Procedure: COLONOSCOPY WITH PROPOFOL;  Surgeon: Virgel Manifold, MD;  Location: [facility];  Service: Endoscopy;  Laterality: N/A;   DIALYSIS/PERMA CATHETER INSERTION N/A 08/04/2018   Procedure: DIALYSIS/PERMA CATHETER INSERTION;  Surgeon: Algernon Huxley, MD;  Location: [place] [facility];  Service: Cardiovascular;  Laterality: N/A;   DIALYSIS/PERMA CATHETER REMOVAL N/A 12/25/2018   Procedure: DIALYSIS/PERMA CATHETER REMOVAL;  Surgeon: Algernon Huxley, MD;  Location: [place] [facility];  Service: Cardiovascular;  Laterality: N/A;   DILATION AND CURETTAGE OF UTERUS     JOINT REPLACEMENT     right knee left hip   Right wrist x-ray  2007   Deg. at 1st MCP   TOTAL HIP ARTHROPLASTY Left  03/27/2016   Procedure: LEFT TOTAL HIP ARTHROPLASTY ANTERIOR APPROACH;  Surgeon: Mcarthur Rossetti, MD;  Location: [place];  Service: Orthopedics;  Laterality: Left;   TOTAL KNEE ARTHROPLASTY Right 04/30/2017   Procedure: RIGHT TOTAL KNEE ARTHROPLASTY;  Surgeon: Mcarthur Rossetti, MD;  Location: [place];  Service: Orthopedics;  Laterality: Right;   TUBAL LIGATION  1981    Family History  Problem Relation Age of Onset   Hypertension Other        Strong family history    Breast cancer Mother 68   Heart failure Sister    Heart disease Sister     Allergies  Allergen Reactions   Naproxen Sodium Anaphylaxis and Swelling   Sulfamethoxazole-Trimethoprim Anaphylaxis and Swelling       Latest Ref Rng & Units 05/04/2021    5:34 AM 05/02/2021    5:24 AM 05/01/2021   10:43 AM  CBC  WBC 4.0 - 10.5 K/uL 4.2   5.5   6.8    Hemoglobin 12.0 - 15.0 g/dL 10.0   9.7   9.8    Hematocrit 36.0 - 46.0 % 30.8   30.9   31.2    Platelets 150 - 400 K/uL 176   170   194        CMP     Component Value Date/Time   NA 134 (L) 05/05/2021 0458   K 3.9 05/05/2021 0458   CL 95 (L) 05/05/2021 0458   CO2 29 05/05/2021 0458   GLUCOSE 87 05/05/2021 0458   BUN 56 (H) 05/05/2021 0458   BUN 36 (A) 07/08/2017 0000   CREATININE 8.77 (H) 05/05/2021 0458   CALCIUM 8.3 (L) 05/05/2021 0458   PROT 7.3 05/02/2021 0524   ALBUMIN 3.6 05/02/2021 0524   AST 14 (L) 05/02/2021 0524   ALT 12 05/02/2021 0524   ALKPHOS 95 05/02/2021 0524   BILITOT 0.7 05/02/2021 0524   GFRNONAA 4 (L) 05/05/2021 0458   GFRAA 9 (L) 11/04/2018 1208     No results found.     Assessment & Plan:   1. ESRD (end stage renal disease) ([place]) Recommend:  The patient is experiencing increasing problems with their dialysis access.  Patient should have a fistulagram with the intention for intervention.  The intention for intervention is to restore appropriate flow and prevent thrombosis and possible loss of the access.  As well as  improve the quality of dialysis therapy.  The risks, benefits and alternative therapies were reviewed in detail with the patient.  All questions were answered.  The patient agrees to proceed with angio/intervention.    The patient will follow up with me in the office after the procedure.   - VAS US DUPLEX DIALYSIS ACCESS (AVF, AVG)  2. Controlled type 2 diabetes mellitus with diabetic nephropathy, without long-term current use of insulin (HCC) Continue hypoglycemic medications as already ordered, these medications have been reviewed and there are no changes at this time.  Hgb A1C to be monitored as already arranged by primary service   3. Hyperlipidemia, unspecified hyperlipidemia type Continue statin as ordered and reviewed, no changes at this time    Current Outpatient Medications on File Prior to Visit  Medication Sig Dispense Refill   acetaminophen (TYLENOL) 500 MG tablet Take 1,000 mg by mouth every 6 (six) hours as needed for mild pain or moderate pain.     albuterol (VENTOLIN HFA) 108 (90 Base) MCG/ACT inhaler Inhale 2 puffs into the lungs every 6 (six) hours as needed for wheezing or shortness of breath. 8 g 2   amLODipine (NORVASC) 10 MG tablet Take 10 mg by mouth daily.     aspirin EC 81 MG tablet Take 81 mg by mouth daily.     atorvastatin (LIPITOR) 40 MG tablet TAKE 40 MG BY MOUTH ONCE DAILY (Patient taking differently: Take 40 mg by mouth daily.) 90 tablet 3   carvedilol (COREG)  12.5 MG tablet Take 1 tablet (12.5 mg total) by mouth 2 (two) times daily. 180 tablet 3   levETIRAcetam (KEPPRA) 1000 MG tablet Take 1,000 mg by mouth 2 (two) times daily.     lidocaine-prilocaine (EMLA) cream Apply 1 application topically every Monday, Wednesday, and Friday with hemodialysis.     LORazepam (ATIVAN) 0.5 MG tablet TAKE 1 TABLET BY MOUTH TWICE A DAY AS NEEDED FOR ANXIETY 60 tablet 0   losartan (COZAAR) 25 MG tablet Take 1-2 tablets (25-50 mg total) by mouth See admin instructions. Take  1 tablet ('25mg'$ ) by mouth daily on Tuesday, Thursday, Saturday and Sunday and take 2 tablets ('50mg'$ ) by mouth daily on Monday, Wednesday and Friday before dialysis 90 tablet 3   multivitamin (RENA-VIT) TABS tablet Take 1 tablet by mouth at bedtime. 30 tablet 0   omeprazole (PRILOSEC) 20 MG capsule Take 1 capsule (20 mg total) by mouth daily. 90 capsule 3   sevelamer carbonate (RENVELA) 800 MG tablet Take 800-1,600 mg by mouth See admin instructions. Take 2 tablets (1600 mg) by mouth three times daily with meals and take 1 tablet (800 mg) by mouth twice daily with snacks     torsemide (DEMADEX) 20 MG tablet Take 20 mg by mouth 4 (four) times a week. (Morning of non-dialysis days)     b complex vitamins capsule Take 1 capsule by mouth daily. (Patient not taking: Reported on 10/26/2021)     No current facility-administered medications on file prior to visit.    There are no Patient Instructions on file for this visit. No follow-ups on file.   Kris Hartmann, NP

## 2021-11-04 NOTE — H&P (View-Only) (Signed)
Subjective:    Patient ID: Carrie [name], female    DOB: 12-11-1950, 71 y.o.   MRN: [id number] No chief complaint on file.   The patient returns to the office for follow up regarding a problem with their dialysis access.   The patient notes a significant increase in bleeding time after decannulation.     The patient denies hand pain or other symptoms consistent with steal phenomena.  No significant arm swelling.  The patient denies redness or swelling at the access site. The patient denies fever or chills at home or while on dialysis.  No recent shortening of the patient's walking distance or new symptoms consistent with claudication.  No history of rest pain symptoms. No new ulcers or wounds of the lower extremities have occurred.  The patient denies amaurosis fugax or recent TIA symptoms. There are no recent neurological changes noted. There is no history of DVT, PE or superficial thrombophlebitis. No recent episodes of angina or shortness of breath documented.   Duplex ultrasound of the AV access shows a patent access.   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  Was noted to have a shoulder multiple sticks in the mid graft.   Review of Systems  Hematological:  Bruises/bleeds easily.  All other systems reviewed and are negative.     Objective:   Physical Exam Vitals reviewed.  HENT:     Head: Normocephalic.  Cardiovascular:     Rate and Rhythm: Normal rate.     Pulses:          Radial pulses are 1+ on the right side and 1+ on the left side.  Pulmonary:     Effort: Pulmonary effort is normal.  Skin:    General: Skin is warm and dry.  Neurological:     Mental Status: She is alert and oriented to person, place, and time.  Psychiatric:        Mood and Affect: Mood normal.        Behavior: Behavior normal.        Thought Content: Thought content normal.        Judgment: Judgment  normal.    BP 123/65 (BP Location: Right Arm)   Pulse 82   Resp 16   Ht '5\' 3"'$  (1.6 m)   Wt 220 lb (99.8 kg)   BMI 38.97 kg/m   Past Medical History:  Diagnosis Date   Allergy    Anemia    Arthritis    "right knee" (10/26'/2017)   Chronic heart failure with preserved ejection fraction (HFpEF) ([place])    a. 04/2021 Echo: EF >55%, GrI DD, nl RV fxn, triv MR.   Depression    ESRD (end stage renal disease) ([place])    a. OnHD (Dr. Lavonia Dana)   History of blood transfusion 2008   "when I had brain tumor surgery"   History of MRSA infection 2008   History of stress test    a. 07/2018 MV: EF 66%, no ischemia/infarct.   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      No living will   Requests niece, [name], as health care POA   Would accept resuscitation attempts   No tube feeds if cognitively aware   Social Determinants of Health   Financial Resource Strain: Low Risk    Difficulty of Paying Living Expenses: Not hard at all  Food Insecurity: No Food Insecurity   Worried About Charity fundraiser in the Last Year: Never true   Section in the Last Year: Never true  Transportation Needs: No Transportation  Needs   Lack of Transportation (Medical): No   Lack of Transportation (Non-Medical): No  Physical Activity: Not on file  Stress: Not on file  Social Connections: Not on file  Intimate Partner Violence: Not on file    Past Surgical History:  Procedure Laterality Date   A/V FISTULAGRAM Left 03/10/2019   Procedure: A/V FISTULAGRAM;  Surgeon: Katha Cabal, MD;  Location: [facility];  Service: Cardiovascular;  Laterality: Left;   A/V FISTULAGRAM Left 07/14/2019   Procedure: A/V FISTULAGRAM;  Surgeon: Katha Cabal, MD;  Location: [facility];  Service: Cardiovascular;  Laterality: Left;   A/V FISTULAGRAM Left 01/07/2020   Procedure: A/V FISTULAGRAM;  Surgeon: Katha Cabal, MD;  Location: [facility];  Service: Cardiovascular;  Laterality: Left;   AV FISTULA PLACEMENT Left 11/07/2018   Procedure: ARTERIOVENOUS (AV) FISTULA CREATION ( BRACHIO BASILIC ) VS BRACHIAL AXILLARY GRAFT;  Surgeon: Katha Cabal, MD;  Location: [facility];  Service: Vascular;  Laterality: Left;   BRAIN MENINGIOMA EXCISION  08/2006   Foramina magnum meningioma   CERVICAL DISCECTOMY  1996   Dr. Alric Seton   CESAREAN SECTION  1981   COLONOSCOPY     COLONOSCOPY WITH PROPOFOL N/A 03/07/2021   Procedure: COLONOSCOPY WITH PROPOFOL;  Surgeon: Virgel Manifold, MD;  Location: [facility];  Service: Endoscopy;  Laterality: N/A;   DIALYSIS/PERMA CATHETER INSERTION N/A 08/04/2018   Procedure: DIALYSIS/PERMA CATHETER INSERTION;  Surgeon: Algernon Huxley, MD;  Location: [facility];  Service: Cardiovascular;  Laterality: N/A;   DIALYSIS/PERMA CATHETER REMOVAL N/A 12/25/2018   Procedure: DIALYSIS/PERMA CATHETER REMOVAL;  Surgeon: Algernon Huxley, MD;  Location: [facility];  Service: Cardiovascular;  Laterality: N/A;   DILATION AND CURETTAGE OF UTERUS     JOINT REPLACEMENT     right knee left hip   Right wrist x-ray  2007   Deg. at 1st MCP   TOTAL HIP ARTHROPLASTY Left  03/27/2016   Procedure: LEFT TOTAL HIP ARTHROPLASTY ANTERIOR APPROACH;  Surgeon: Mcarthur Rossetti, MD;  Location: [facility];  Service: Orthopedics;  Laterality: Left;   TOTAL KNEE ARTHROPLASTY Right 04/30/2017   Procedure: RIGHT TOTAL KNEE ARTHROPLASTY;  Surgeon: Mcarthur Rossetti, MD;  Location: [facility];  Service: Orthopedics;  Laterality: Right;   TUBAL LIGATION  1981    Family History  Problem Relation Age of Onset   Hypertension Other        Strong family history    Breast cancer Mother 23   Heart failure Sister    Heart disease Sister     Allergies  Allergen Reactions   Naproxen Sodium Anaphylaxis and Swelling   Sulfamethoxazole-Trimethoprim Anaphylaxis and Swelling       Latest Ref Rng & Units 05/04/2021    5:34 AM 05/02/2021    5:24 AM 05/01/2021   10:43 AM  CBC  WBC 4.0 - 10.5 K/uL 4.2   5.5   6.8    Hemoglobin 12.0 - 15.0 g/dL 10.0   9.7   9.8    Hematocrit 36.0 - 46.0 % 30.8   30.9   31.2    Platelets 150 - 400 K/uL 176   170   194        CMP     Component Value Date/Time   NA 134 (L) 05/05/2021 0458   K 3.9 05/05/2021 0458   CL 95 (L) 05/05/2021 0458   CO2 29 05/05/2021 0458   GLUCOSE 87 05/05/2021 0458   BUN 56 (H) 05/05/2021 0458   BUN 36 (A) 07/08/2017 0000   CREATININE 8.77 (H) 05/05/2021 0458   CALCIUM 8.3 (L) 05/05/2021 0458   PROT 7.3 05/02/2021 0524   ALBUMIN 3.6 05/02/2021 0524   AST 14 (L) 05/02/2021 0524   ALT 12 05/02/2021 0524   ALKPHOS 95 05/02/2021 0524   BILITOT 0.7 05/02/2021 0524   GFRNONAA 4 (L) 05/05/2021 0458   GFRAA 9 (L) 11/04/2018 1208     No results found.     Assessment & Plan:   1. ESRD (end stage renal disease) ([place]) Recommend:  The patient is experiencing increasing problems with their dialysis access.  Patient should have a fistulagram with the intention for intervention.  The intention for intervention is to restore appropriate flow and prevent thrombosis and possible loss of the access.  As well as  improve the quality of dialysis therapy.  The risks, benefits and alternative therapies were reviewed in detail with the patient.  All questions were answered.  The patient agrees to proceed with angio/intervention.    The patient will follow up with me in the office after the procedure.   - VAS US DUPLEX DIALYSIS ACCESS (AVF, AVG)  2. Controlled type 2 diabetes mellitus with diabetic nephropathy, without long-term current use of insulin (HCC) Continue hypoglycemic medications as already ordered, these medications have been reviewed and there are no changes at this time.  Hgb A1C to be monitored as already arranged by primary service   3. Hyperlipidemia, unspecified hyperlipidemia type Continue statin as ordered and reviewed, no changes at this time    Current Outpatient Medications on File Prior to Visit  Medication Sig Dispense Refill   acetaminophen (TYLENOL) 500 MG tablet Take 1,000 mg by mouth every 6 (six) hours as needed for mild pain or moderate pain.     albuterol (VENTOLIN HFA) 108 (90 Base) MCG/ACT inhaler Inhale 2 puffs into the lungs every 6 (six) hours as needed for wheezing or shortness of breath. 8 g 2   amLODipine (NORVASC) 10 MG tablet Take 10 mg by mouth daily.     aspirin EC 81 MG tablet Take 81 mg by mouth daily.     atorvastatin (LIPITOR) 40 MG tablet TAKE 40 MG BY MOUTH ONCE DAILY (Patient taking differently: Take 40 mg by mouth daily.) 90 tablet 3   carvedilol (COREG)  12.5 MG tablet Take 1 tablet (12.5 mg total) by mouth 2 (two) times daily. 180 tablet 3   levETIRAcetam (KEPPRA) 1000 MG tablet Take 1,000 mg by mouth 2 (two) times daily.     lidocaine-prilocaine (EMLA) cream Apply 1 application topically every Monday, Wednesday, and Friday with hemodialysis.     LORazepam (ATIVAN) 0.5 MG tablet TAKE 1 TABLET BY MOUTH TWICE A DAY AS NEEDED FOR ANXIETY 60 tablet 0   losartan (COZAAR) 25 MG tablet Take 1-2 tablets (25-50 mg total) by mouth See admin instructions. Take  1 tablet ('25mg'$ ) by mouth daily on Tuesday, Thursday, Saturday and Sunday and take 2 tablets ('50mg'$ ) by mouth daily on Monday, Wednesday and Friday before dialysis 90 tablet 3   multivitamin (RENA-VIT) TABS tablet Take 1 tablet by mouth at bedtime. 30 tablet 0   omeprazole (PRILOSEC) 20 MG capsule Take 1 capsule (20 mg total) by mouth daily. 90 capsule 3   sevelamer carbonate (RENVELA) 800 MG tablet Take 800-1,600 mg by mouth See admin instructions. Take 2 tablets (1600 mg) by mouth three times daily with meals and take 1 tablet (800 mg) by mouth twice daily with snacks     torsemide (DEMADEX) 20 MG tablet Take 20 mg by mouth 4 (four) times a week. (Morning of non-dialysis days)     b complex vitamins capsule Take 1 capsule by mouth daily. (Patient not taking: Reported on 10/26/2021)     No current facility-administered medications on file prior to visit.    There are no Patient Instructions on file for this visit. No follow-ups on file.   Kris Hartmann, NP

## 2021-11-06 DIAGNOSIS — N186 End stage renal disease: Secondary | ICD-10-CM | POA: Diagnosis not present

## 2021-11-06 DIAGNOSIS — Z992 Dependence on renal dialysis: Secondary | ICD-10-CM | POA: Diagnosis not present

## 2021-11-06 DIAGNOSIS — N2581 Secondary hyperparathyroidism of renal origin: Secondary | ICD-10-CM | POA: Diagnosis not present

## 2021-11-08 DIAGNOSIS — Z992 Dependence on renal dialysis: Secondary | ICD-10-CM | POA: Diagnosis not present

## 2021-11-08 DIAGNOSIS — N186 End stage renal disease: Secondary | ICD-10-CM | POA: Diagnosis not present

## 2021-11-08 DIAGNOSIS — N2581 Secondary hyperparathyroidism of renal origin: Secondary | ICD-10-CM | POA: Diagnosis not present

## 2021-11-10 DIAGNOSIS — N186 End stage renal disease: Secondary | ICD-10-CM | POA: Diagnosis not present

## 2021-11-10 DIAGNOSIS — Z992 Dependence on renal dialysis: Secondary | ICD-10-CM | POA: Diagnosis not present

## 2021-11-10 DIAGNOSIS — N2581 Secondary hyperparathyroidism of renal origin: Secondary | ICD-10-CM | POA: Diagnosis not present

## 2021-11-13 DIAGNOSIS — N186 End stage renal disease: Secondary | ICD-10-CM | POA: Diagnosis not present

## 2021-11-13 DIAGNOSIS — Z992 Dependence on renal dialysis: Secondary | ICD-10-CM | POA: Diagnosis not present

## 2021-11-13 DIAGNOSIS — N2581 Secondary hyperparathyroidism of renal origin: Secondary | ICD-10-CM | POA: Diagnosis not present

## 2021-11-14 ENCOUNTER — Encounter
Admission: RE | Disposition: A | Discharge status: Home / Self Care | Payer: Self-pay | Source: Home / Self Care | Attending: Vascular Surgery

## 2021-11-14 ENCOUNTER — Ambulatory Visit
Admission: RE | Admit: 2021-11-14 | Discharge: 2021-11-14 | Disposition: A | Discharge status: Home / Self Care | Payer: Medicare HMO | Attending: Vascular Surgery | Admitting: Vascular Surgery

## 2021-11-14 ENCOUNTER — Other Ambulatory Visit: Payer: Self-pay

## 2021-11-14 ENCOUNTER — Encounter: Payer: Self-pay | Admitting: Vascular Surgery

## 2021-11-14 DIAGNOSIS — T82858A Stenosis of vascular prosthetic devices, implants and grafts, initial encounter: Secondary | ICD-10-CM | POA: Diagnosis not present

## 2021-11-14 DIAGNOSIS — I132 Hypertensive heart and chronic kidney disease with heart failure and with stage 5 chronic kidney disease, or end stage renal disease: Secondary | ICD-10-CM | POA: Diagnosis not present

## 2021-11-14 DIAGNOSIS — Z992 Dependence on renal dialysis: Secondary | ICD-10-CM | POA: Insufficient documentation

## 2021-11-14 DIAGNOSIS — Y841 Kidney dialysis as the cause of abnormal reaction of the patient, or of later complication, without mention of misadventure at the time of the procedure: Secondary | ICD-10-CM | POA: Diagnosis not present

## 2021-11-14 DIAGNOSIS — I5022 Chronic systolic (congestive) heart failure: Secondary | ICD-10-CM | POA: Insufficient documentation

## 2021-11-14 DIAGNOSIS — E1122 Type 2 diabetes mellitus with diabetic chronic kidney disease: Secondary | ICD-10-CM | POA: Diagnosis not present

## 2021-11-14 DIAGNOSIS — N186 End stage renal disease: Secondary | ICD-10-CM | POA: Insufficient documentation

## 2021-11-14 DIAGNOSIS — E785 Hyperlipidemia, unspecified: Secondary | ICD-10-CM | POA: Diagnosis not present

## 2021-11-14 HISTORY — PX: A/V FISTULAGRAM: CATH118298

## 2021-11-14 LAB — POTASSIUM (ARMC VASCULAR LAB ONLY): Potassium (ARMC vascular lab): 4.6 mmol/L (ref 3.5–5.1)

## 2021-11-14 SURGERY — A/V FISTULAGRAM
Anesthesia: Moderate Sedation | Laterality: Left

## 2021-11-14 MED ORDER — ONDANSETRON HCL 4 MG/2ML IJ SOLN
4.0000 mg | Freq: Four times a day (QID) | INTRAMUSCULAR | Status: DC | PRN
Start: 2021-11-14 — End: 2021-11-14

## 2021-11-14 MED ORDER — HYDROMORPHONE HCL 1 MG/ML IJ SOLN: 1.0000 mg | Freq: Once | INTRAMUSCULAR | Status: DC | PRN

## 2021-11-14 MED ORDER — FENTANYL CITRATE PF 50 MCG/ML IJ SOSY
PREFILLED_SYRINGE | INTRAMUSCULAR | Status: AC
  Filled 2021-11-14: qty 2

## 2021-11-14 MED ORDER — IODIXANOL 320 MG/ML IV SOLN
INTRAVENOUS | Status: DC | PRN
  Administered 2021-11-14: 25 mL via INTRAVENOUS

## 2021-11-14 MED ORDER — HEPARIN SODIUM (PORCINE) 1000 UNIT/ML IJ SOLN
INTRAMUSCULAR | Status: DC | PRN
  Administered 2021-11-14: 3000 [IU] via INTRAVENOUS

## 2021-11-14 MED ORDER — METHYLPREDNISOLONE SODIUM SUCC 125 MG IJ SOLR: 125.0000 mg | Freq: Once | INTRAMUSCULAR | Status: DC | PRN

## 2021-11-14 MED ORDER — CEFAZOLIN SODIUM-DEXTROSE 1-4 GM/50ML-% IV SOLN
1.0000 g | INTRAVENOUS | Status: AC
  Administered 2021-11-14: 1 g via INTRAVENOUS

## 2021-11-14 MED ORDER — MIDAZOLAM HCL 2 MG/2ML IJ SOLN
INTRAMUSCULAR | Status: AC
  Filled 2021-11-14: qty 4

## 2021-11-14 MED ORDER — FAMOTIDINE 20 MG PO TABS: 40.0000 mg | ORAL_TABLET | Freq: Once | ORAL | Status: DC | PRN

## 2021-11-14 MED ORDER — MIDAZOLAM HCL 2 MG/ML PO SYRP: 8.0000 mg | ORAL_SOLUTION | Freq: Once | ORAL | Status: DC | PRN

## 2021-11-14 MED ORDER — FENTANYL CITRATE (PF) 100 MCG/2ML IJ SOLN
INTRAMUSCULAR | Status: DC | PRN
  Administered 2021-11-14: 25 ug via INTRAVENOUS
  Administered 2021-11-14: 50 ug via INTRAVENOUS
  Administered 2021-11-14: 25 ug via INTRAVENOUS

## 2021-11-14 MED ORDER — MIDAZOLAM HCL 2 MG/2ML IJ SOLN
INTRAMUSCULAR | Status: DC | PRN
  Administered 2021-11-14: 1 mg via INTRAVENOUS
  Administered 2021-11-14: 2 mg via INTRAVENOUS
  Administered 2021-11-14 (×2): .5 mg via INTRAVENOUS

## 2021-11-14 MED ORDER — DIPHENHYDRAMINE HCL 50 MG/ML IJ SOLN: 50.0000 mg | Freq: Once | INTRAMUSCULAR | Status: DC | PRN

## 2021-11-14 MED ORDER — HEPARIN SODIUM (PORCINE) 1000 UNIT/ML IJ SOLN
INTRAMUSCULAR | Status: AC
  Filled 2021-11-14: qty 10

## 2021-11-14 MED ORDER — SODIUM CHLORIDE 0.9 % IV SOLN: INTRAVENOUS | Status: DC

## 2021-11-14 SURGICAL SUPPLY — 20 items
BALLN DORADO 5X40X80 (BALLOONS) ×2
BALLN DORADO 8X60X80 (BALLOONS) ×2
BALLN LUTONIX AV 6X60X75 (BALLOONS) ×2
BALLOON DORADO 5X40X80 (BALLOONS) IMPLANT
BALLOON DORADO 8X60X80 (BALLOONS) IMPLANT
BALLOON LUTONIX AV 6X60X75 (BALLOONS) IMPLANT
CATH BEACON 5 .035 40 KMP TP (CATHETERS) IMPLANT
CATH BEACON 5 .038 40 KMP TP (CATHETERS) ×2
COVER PROBE U/S 5X48 (MISCELLANEOUS) ×1 IMPLANT
DRAPE BRACHIAL (DRAPES) ×1 IMPLANT
GUIDEWIRE AMPLATZ SHORT (WIRE) ×1 IMPLANT
KIT ENCORE 26 ADVANTAGE (KITS) ×1 IMPLANT
NDL ENTRY 21GA 7CM ECHOTIP (NEEDLE) IMPLANT
NEEDLE ENTRY 21GA 7CM ECHOTIP (NEEDLE) ×2 IMPLANT
PACK ANGIOGRAPHY (CUSTOM PROCEDURE TRAY) ×3 IMPLANT
SET INTRO CAPELLA COAXIAL (SET/KITS/TRAYS/PACK) ×1 IMPLANT
SHEATH BRITE TIP 5FRX11 (SHEATH) ×1 IMPLANT
SHEATH BRITE TIP 6FRX5.5 (SHEATH) ×1 IMPLANT
SUT MNCRL AB 4-0 PS2 18 (SUTURE) ×1 IMPLANT
WIRE MAGIC TOR.035 180C (WIRE) ×1 IMPLANT

## 2021-11-14 NOTE — Op Note (Signed)
OPERATIVE NOTE   PROCEDURE: Contrast injection left arm brachial axillary AV access Percutaneous transluminal angioplasty peripheral segment in 2 locations mid graft to 8 mm and the proximal graft just above the anastomosis to 6 mm  PRE-OPERATIVE DIAGNOSIS: Complication of dialysis access                                                       End Stage Renal Disease  POST-OPERATIVE DIAGNOSIS: same as above   SURGEON: Katha Cabal, M.D.  ANESTHESIA: Conscious sedation was administered under my direct supervision by the interventional radiology RN. IV Versed plus fentanyl were utilized. Continuous ECG, pulse oximetry and blood pressure was monitored throughout the entire procedure.  Conscious sedation was for a total of 38 minutes and 24 seconds.  ESTIMATED BLOOD LOSS: minimal  FINDING(S): Stricture of the AV graft  SPECIMEN(S):  None  CONTRAST: 25 cc  FLUOROSCOPY TIME: 2.1 minutes  INDICATIONS: Carrie [name] is a 71 y.o. female who  presents with malfunctioning left arm AV access AV access.  The patient is scheduled for angiography with possible intervention of the AV access.  The patient is aware the risks include but are not limited to: bleeding, infection, thrombosis of the cannulated access, and possible anaphylactic reaction to the contrast.  The patient acknowledges if the access can not be salvaged a tunneled catheter will be needed and will be placed during this procedure.  The patient is aware of the risks of the procedure and elects to proceed with the angiogram and intervention.  DESCRIPTION: After full informed written consent was obtained, the patient was brought back to the Special Procedure suite and placed supine position.  Appropriate cardiopulmonary monitors were placed.  The left arm was prepped and draped in the standard fashion.  Appropriate timeout is called. The left AV access was cannulated with a micropuncture needle.  Cannulation was performed with  ultrasound guidance. Ultrasound was placed in a sterile sleeve, the AV access was interrogated and noted to be echolucent and compressible indicating patency. Image was recorded for the permanent record. The puncture is performed under continuous ultrasound visualization.   The microwire was advanced and the needle was exchanged for  a microsheath.  The J-wire was then advanced and a 6 Fr sheath inserted.  Hand injections were completed to image the access from the arterial anastomosis through the entire access.  The central venous structures were also imaged by hand injections.  Interpretation: Initial imaging demonstrates that the midportion of the graft in the previously stented segment now has greater than 80% narrowing associated with multiple stent fractures over a distance of 3 to 4 cm.  More proximally the previously placed Viabahn stent is widely patent the axillary subclavian and Ottoman superior vena cava are widely patent.  Later with the balloon inflated reflux image demonstrates a greater than 80% narrowing just above the arterial anastomosis.  Visualized portions of the brachial artery are widely patent.  Based on the images,  3000 units of heparin was given and a wire was negotiated through the strictures within the venous portion of the graft.  An 8 mm x 60 mm Dorado balloon was used.  Inflation was to approximately 30 atm for 1 minute.  Follow-up imaging demonstrates complete resolution of the stricture with less than 5% residual stenosis and rapid flow  of contrast through the graft, the central venous anatomy is preserved.  I then turned my attention to the more proximal stenosis near the arterial anastomosis.  This required placing a retrograde sheath.  The ultrasound was returned to the field in a sterile sleeve the graft was ultrasounded more proximally on the arm 1% lidocaine was infiltrated image was recorded for the permanent record and direct puncture of the graft was made with a  microneedle under ultrasound guidance.  Microwire followed by microsheath.  5 French sheath would not advance over the J-wire and a Amplatz Super Stiff wire was advanced followed by placement of the 5 [place] sheath.  A Magic torque wire was then negotiated into the proximal brachial artery and a 6 mm x 60 mm Lutonix drug-eluting balloon was used to treat this lesion.  Inflation was to 14 atm for 1 minute.  The balloon did not completely profile and I elected to use a 5 mm high-pressure Dorado balloon inflated to 18 atm for 1 minute.  Kumpe catheter was advanced over the wire and then imaging was performed.  This demonstrated less than 5% residual stenosis.  At this point elected to terminate the case.  A 4-0 Monocryl purse-string sutures were sewn around each sheath.  The sheath was removed and light pressure was applied.  A sterile bandage was applied to the puncture site.    COMPLICATIONS: None  CONDITION: Carrie [name], M.D [facility] Office: [phone]  11/14/2021 9:04 AM

## 2021-11-14 NOTE — Interval H&P Note (Signed)
History and Physical Interval Note:  11/14/2021 7:56 AM  Carrie [name]  has presented today for surgery, with the diagnosis of LT Upper Extremity Fistulagram   ESRD.  The various methods of treatment have been discussed with the patient and family. After consideration of risks, benefits and other options for treatment, the patient has consented to  Procedure(s): A/V Fistulagram (Left) as a surgical intervention.  The patient's history has been reviewed, patient examined, no change in status, stable for surgery.  I have reviewed the patient's chart and labs.  Questions were answered to the patient's satisfaction.     [name]

## 2021-11-15 DIAGNOSIS — N186 End stage renal disease: Secondary | ICD-10-CM | POA: Diagnosis not present

## 2021-11-15 DIAGNOSIS — N2581 Secondary hyperparathyroidism of renal origin: Secondary | ICD-10-CM | POA: Diagnosis not present

## 2021-11-15 DIAGNOSIS — Z992 Dependence on renal dialysis: Secondary | ICD-10-CM | POA: Diagnosis not present

## 2021-11-17 DIAGNOSIS — Z992 Dependence on renal dialysis: Secondary | ICD-10-CM | POA: Diagnosis not present

## 2021-11-17 DIAGNOSIS — N2581 Secondary hyperparathyroidism of renal origin: Secondary | ICD-10-CM | POA: Diagnosis not present

## 2021-11-17 DIAGNOSIS — N186 End stage renal disease: Secondary | ICD-10-CM | POA: Diagnosis not present

## 2021-11-20 DIAGNOSIS — Z992 Dependence on renal dialysis: Secondary | ICD-10-CM | POA: Diagnosis not present

## 2021-11-20 DIAGNOSIS — N186 End stage renal disease: Secondary | ICD-10-CM | POA: Diagnosis not present

## 2021-11-20 DIAGNOSIS — N2581 Secondary hyperparathyroidism of renal origin: Secondary | ICD-10-CM | POA: Diagnosis not present

## 2021-11-22 DIAGNOSIS — N186 End stage renal disease: Secondary | ICD-10-CM | POA: Diagnosis not present

## 2021-11-22 DIAGNOSIS — Z992 Dependence on renal dialysis: Secondary | ICD-10-CM | POA: Diagnosis not present

## 2021-11-22 DIAGNOSIS — N2581 Secondary hyperparathyroidism of renal origin: Secondary | ICD-10-CM | POA: Diagnosis not present

## 2021-11-23 ENCOUNTER — Ambulatory Visit (INDEPENDENT_AMBULATORY_CARE_PROVIDER_SITE_OTHER): Payer: Medicare HMO

## 2021-11-23 DIAGNOSIS — G8929 Other chronic pain: Secondary | ICD-10-CM

## 2021-11-23 DIAGNOSIS — I5032 Chronic diastolic (congestive) heart failure: Secondary | ICD-10-CM

## 2021-11-23 DIAGNOSIS — N186 End stage renal disease: Secondary | ICD-10-CM

## 2021-11-23 DIAGNOSIS — I10 Essential (primary) hypertension: Secondary | ICD-10-CM

## 2021-11-23 NOTE — Chronic Care Management (AMB) (Signed)
Chronic Care Management   CCM RN Visit Note  11/23/2021 Name: Carrie [name] MRN: [id number] DOB: 02/08/1951  Subjective: Carrie [name] is a 71 y.o. year old female who is a primary care patient of Letvak, Richard I, MD. The care management team was consulted for assistance with disease management and care coordination needs.    Engaged with patient by telephone for follow up visit in response to provider referral for case management and/or care coordination services.   Consent to Services:  The patient was given information about Chronic Care Management services, agreed to services, and gave verbal consent prior to initiation of services.  Please see initial visit note for detailed documentation.   Patient agreed to services and verbal consent obtained.   Assessment: Review of patient past medical history, allergies, medications, health status, including review of consultants reports, laboratory and other test data, was performed as part of comprehensive evaluation and provision of chronic care management services.   SDOH (Social Determinants of Health) assessments and interventions performed:    CCM Care Plan  Allergies  Allergen Reactions   Naproxen Sodium Anaphylaxis and Swelling   Sulfamethoxazole-Trimethoprim Anaphylaxis and Swelling    Outpatient Encounter Medications as of 11/23/2021  Medication Sig   acetaminophen (TYLENOL) 500 MG tablet Take 1,000 mg by mouth every 6 (six) hours as needed for mild pain or moderate pain.   albuterol (VENTOLIN HFA) 108 (90 Base) MCG/ACT inhaler Inhale 2 puffs into the lungs every 6 (six) hours as needed for wheezing or shortness of breath.   amLODipine (NORVASC) 10 MG tablet Take 10 mg by mouth daily.   aspirin EC 81 MG tablet Take 81 mg by mouth daily.   atorvastatin (LIPITOR) 40 MG tablet TAKE 40 MG BY MOUTH ONCE DAILY (Patient taking differently: Take 40 mg by mouth daily.)   b complex vitamins capsule Take 1 capsule by mouth daily.  (Patient not taking: Reported on 10/26/2021)   carvedilol (COREG) 12.5 MG tablet Take 1 tablet (12.5 mg total) by mouth 2 (two) times daily.   levETIRAcetam (KEPPRA) 1000 MG tablet Take 1,000 mg by mouth 2 (two) times daily.   lidocaine-prilocaine (EMLA) cream Apply 1 application topically every Monday, Wednesday, and Friday with hemodialysis.   LORazepam (ATIVAN) 0.5 MG tablet TAKE 1 TABLET BY MOUTH TWICE A DAY AS NEEDED FOR ANXIETY   losartan (COZAAR) 25 MG tablet Take 1-2 tablets (25-50 mg total) by mouth See admin instructions. Take 1 tablet (25mg) by mouth daily on Tuesday, Thursday, Saturday and Sunday and take 2 tablets (50mg) by mouth daily on Monday, Wednesday and Friday before dialysis   multivitamin (RENA-VIT) TABS tablet Take 1 tablet by mouth at bedtime.   omeprazole (PRILOSEC) 20 MG capsule Take 1 capsule (20 mg total) by mouth daily.   sevelamer carbonate (RENVELA) 800 MG tablet Take 800-1,600 mg by mouth See admin instructions. Take 2 tablets (1600 mg) by mouth three times daily with meals and take 1 tablet (800 mg) by mouth twice daily with snacks   torsemide (DEMADEX) 20 MG tablet Take 20 mg by mouth 4 (four) times a week. Every day (including on dialysis days)   No facility-administered encounter medications on file as of 11/23/2021.    Patient Active Problem List   Diagnosis Date Noted   Chronic diastolic heart failure (HCC) 05/17/2021   Seizure disorder (HCC)    Hyperlipidemia    SOB (shortness of breath) 05/01/2021   Anemia 05/01/2021   Chronic hypoxemic respiratory failure (HCC) 05/01/2021     Positive FIT (fecal immunochemical test)    Polyp of ascending colon    Polyp of descending colon    Polyp of transverse colon    Dysphagia 02/07/2021   Tremor 12/01/2020   Headache 12/01/2020   Eustachian tube dysfunction 10/25/2020   History of shingles 09/26/2020   Primary osteoarthritis of left knee 08/22/2020   Complication from renal dialysis device 11/20/2018   ESRD on  hemodialysis (HCC) 08/04/2018   Cough 10/01/2017   Status post total right knee replacement 04/30/2017   Unilateral primary osteoarthritis, right knee 03/18/2017   History of total hip replacement, left 03/18/2017   Unilateral primary osteoarthritis, left hip 03/27/2016   Status post total replacement of left hip 03/27/2016   Mood disorder (HCC) 04/01/2015   Osteoarthritis of right knee 02/03/2014   Advanced directives, counseling/discussion 02/03/2014   Routine general medical examination at a health care facility 07/08/2012   Obesity, Class III, BMI 40-49.9 (morbid obesity) (HCC) 07/08/2012   Type 2 diabetes, controlled, with renal manifestation (HCC)    Otitis externa 03/20/2010   HYPERCHOLESTEROLEMIA 09/26/2006   Essential hypertension 09/26/2006   ALLERGIC RHINITIS 09/26/2006   History of meningioma 08/03/2006    Conditions to be addressed/monitored:CHF, HTN, ESRD, and chronic pain  Care Plan : RN Case Manager Plan of care  Updates made by Green, Davina E, RN since 11/23/2021 12:00 AM     Problem: Knowledge deficit related to long term care plan for management of chronic conditions ( HTN, HF, ESRD)   Priority: High     Long-Range Goal: Chronic conditions monitored and managed ( HTN, HF, ESRD   Start Date: 04/11/2021  Expected End Date: 02/01/2022  Priority: High  Note:   Current Barriers:  Knowledge Deficits related to plan of care for management of CHF, HTN, and ESRD  Chronic Disease Management support and education needs related to CHF, HTN, and ESRD Patient reports having visit with vascular doctor on 10/26/21 due to problems with dialysis access. She reports having surgery on dialysis access on 11/14/2021.  Reports no additional problems at this time and has resumed dialysis.  Patient reports having recent stress test , follow up with cardiologist on 12/04/21  and echocardiogram scheduled for 12/07/21.  Patient denies any issues with increase SOB, lower extremity swelling, or  chest pain.   Patient she is monitoring her diet closely, adhering to low sodium diet and fluid restriction.   Patient reports having follow up visit with vascular doctor on 12/21/21 and annual wellness visit with primary provider on 02/20/22.     RNCM Clinical Goal(s):  Patient will verbalize basic understanding of CHF, HTN, and ESRD disease process and self health management plan as evidenced by weighing at least 3 times per week, taking medications as prescribed, monitoring blood pressure at least 2-3 days per week and recording.  take all medications exactly as prescribed and will call provider for medication related questions as evidenced by      attend all scheduled medical appointments:   as evidenced by patient reporting they have attended appointments.         continue to work with RN Care Manager and/or Social Worker to address care management and care coordination needs related to CHF, HTN, and ESRD as evidenced by adherence to CM Team Scheduled appointments     through collaboration with RN Care manager, provider, and care team.   Interventions: 1:1 collaboration with primary care provider regarding development and update of comprehensive plan of care as evidenced by provider   attestation and co-signature Inter-disciplinary care team collaboration (see longitudinal plan of care) Evaluation of current treatment plan related to  self management and patient's adherence to plan as established by provider  Hypertension Interventions: Goal on track:  Long term  Last practice recorded BP readings:  BP Readings from Last 3 Encounters:  11/14/21 (!) 167/82  10/26/21 123/65  10/17/21 (!) 158/90  Most recent eGFR/CrCl: No results found for: "EGFR"  No components found for: "CRCL"  Evaluation of current treatment plan related to hypertension self management and patient's adherence to plan as established by provider; Reviewed medications with patient and discussed importance of  compliance; Discussed plans with patient for ongoing care management follow up and provided patient with direct contact information for care management team; Reviewed scheduled/upcoming provider appointments  Advised to continue to follow low salt diet. Advised to monitor blood pressure at least 1-2 times per week at home  Advised to remain as active as possible.   Chronic Pain Interventions: Goal on track:  Long term Pain assessment performed Medications reviewed and discussed importance of compliance.  Discussed importance of adherence to all scheduled medical appointments; Counseled on the importance of reporting any/all new or changed pain symptoms or management strategies to pain management provider; Reviewed with patient prescribed pharmacological and nonpharmacological pain relief strategies; Advised to elevate left leg when sitting.    ESRD Interventions:  Goal on track :  Long term Reviewed medications with patient and discussed importance Reviewed scheduled/upcoming provider appointments  Discussed plans with patient for ongoing care management follow up and provided patient with direct contact information for care management team;  Heart Failure Interventions: Goal on Track:  Long term Discussed heart failure action plan/ zones Discussed importance of daily weight and advised patient to weigh and record daily; Discussed the importance of keeping all appointments with provider;  Reviewed medications and discussed importance of compliance.  Reviewed heart failure signs/ symptoms.  Advised to continue to follow low salt diet and fluid restrictions as advised by provider.   Patient Goals/Self-Care Activities: Attend all scheduled provider appointments  Call provider office for new concerns or questions  Check blood pressure and oxygen levels at least 2-3 times per week and record. write blood pressure results in a log or diary (take log to your appointments to share information  with your provider) Continue to follow a low sodium/ DASH diet and adhere to fluid restriction as recommended by your doctor.   Weight at home at least 1-2 times per week and record  Elevate legs when sitting due to knee pain.  Apply ice for pain management.  Continue to take medications as prescribed and refill timely       Plan:The patient has been provided with contact information for the care management team and has been advised to call with any health related questions or concerns.  The care management team will reach out to the patient again over the next 2 months. Davina Green RN,BSN,CCM RN Case Manager [place]  [phone]          

## 2021-11-23 NOTE — Patient Instructions (Signed)
Visit Information  Thank you for taking time to visit with me today. Please don't hesitate to contact me if I can be of assistance to you before our next scheduled telephone appointment.  Following are the goals we discussed today:  Attend all scheduled provider appointments  Call provider office for new concerns or questions  Check blood pressure and oxygen levels at least 2-3 times per week and record. write blood pressure results in a log or diary (take log to your appointments to share information with your provider) Continue to follow a low sodium/ DASH diet and adhere to fluid restriction as recommended by your doctor.   Weight at home at least 1-2 times per week and record  Elevate legs when sitting due to knee pain.  Apply ice for pain management.  Continue to take medications as prescribed and refill timely  Our next appointment is by telephone on 12/28/21 at 1:00 pm  Please call the care guide team at [phone] if you need to cancel or reschedule your appointment.   If you are experiencing a Mental Health or [place] or need someone to talk to, please call the Suicide and Crisis Lifeline: 988 call 1-800-273-TALK (toll free, 24 hour hotline)   Patient verbalizes understanding of instructions and care plan provided today and agrees to view in [place]. Active MyChart status and patient understanding of how to access instructions and care plan via MyChart confirmed with patient.     Quinn Plowman RN,BSN,CCM RN Case Manager [place]  [phone]

## 2021-11-24 DIAGNOSIS — N186 End stage renal disease: Secondary | ICD-10-CM | POA: Diagnosis not present

## 2021-11-24 DIAGNOSIS — Z992 Dependence on renal dialysis: Secondary | ICD-10-CM | POA: Diagnosis not present

## 2021-11-24 DIAGNOSIS — N2581 Secondary hyperparathyroidism of renal origin: Secondary | ICD-10-CM | POA: Diagnosis not present

## 2021-11-27 DIAGNOSIS — N2581 Secondary hyperparathyroidism of renal origin: Secondary | ICD-10-CM | POA: Diagnosis not present

## 2021-11-27 DIAGNOSIS — N186 End stage renal disease: Secondary | ICD-10-CM | POA: Diagnosis not present

## 2021-11-27 DIAGNOSIS — Z992 Dependence on renal dialysis: Secondary | ICD-10-CM | POA: Diagnosis not present

## 2021-11-28 ENCOUNTER — Other Ambulatory Visit: Payer: Self-pay | Admitting: Internal Medicine

## 2021-11-29 DIAGNOSIS — Z992 Dependence on renal dialysis: Secondary | ICD-10-CM | POA: Diagnosis not present

## 2021-11-29 DIAGNOSIS — N2581 Secondary hyperparathyroidism of renal origin: Secondary | ICD-10-CM | POA: Diagnosis not present

## 2021-11-29 DIAGNOSIS — N186 End stage renal disease: Secondary | ICD-10-CM | POA: Diagnosis not present

## 2021-12-01 DIAGNOSIS — N2581 Secondary hyperparathyroidism of renal origin: Secondary | ICD-10-CM | POA: Diagnosis not present

## 2021-12-01 DIAGNOSIS — Z992 Dependence on renal dialysis: Secondary | ICD-10-CM

## 2021-12-01 DIAGNOSIS — N186 End stage renal disease: Secondary | ICD-10-CM

## 2021-12-01 DIAGNOSIS — I132 Hypertensive heart and chronic kidney disease with heart failure and with stage 5 chronic kidney disease, or end stage renal disease: Secondary | ICD-10-CM

## 2021-12-01 DIAGNOSIS — I509 Heart failure, unspecified: Secondary | ICD-10-CM

## 2021-12-02 DIAGNOSIS — Z992 Dependence on renal dialysis: Secondary | ICD-10-CM | POA: Diagnosis not present

## 2021-12-02 DIAGNOSIS — N2581 Secondary hyperparathyroidism of renal origin: Secondary | ICD-10-CM | POA: Diagnosis not present

## 2021-12-02 DIAGNOSIS — N186 End stage renal disease: Secondary | ICD-10-CM | POA: Diagnosis not present

## 2021-12-04 ENCOUNTER — Ambulatory Visit: Payer: Medicare HMO | Admitting: Cardiovascular Disease

## 2021-12-04 DIAGNOSIS — Z992 Dependence on renal dialysis: Secondary | ICD-10-CM | POA: Diagnosis not present

## 2021-12-04 DIAGNOSIS — N2581 Secondary hyperparathyroidism of renal origin: Secondary | ICD-10-CM | POA: Diagnosis not present

## 2021-12-04 DIAGNOSIS — N186 End stage renal disease: Secondary | ICD-10-CM | POA: Diagnosis not present

## 2021-12-06 DIAGNOSIS — N2581 Secondary hyperparathyroidism of renal origin: Secondary | ICD-10-CM | POA: Diagnosis not present

## 2021-12-06 DIAGNOSIS — N186 End stage renal disease: Secondary | ICD-10-CM | POA: Diagnosis not present

## 2021-12-06 DIAGNOSIS — Z992 Dependence on renal dialysis: Secondary | ICD-10-CM | POA: Diagnosis not present

## 2021-12-07 ENCOUNTER — Ambulatory Visit (INDEPENDENT_AMBULATORY_CARE_PROVIDER_SITE_OTHER): Payer: Medicare HMO

## 2021-12-07 DIAGNOSIS — I3139 Other pericardial effusion (noninflammatory): Secondary | ICD-10-CM | POA: Diagnosis not present

## 2021-12-07 LAB — ECHOCARDIOGRAM LIMITED
S' Lateral: 3.1 cm
Single Plane A4C EF: 54.2 %

## 2021-12-08 DIAGNOSIS — N2581 Secondary hyperparathyroidism of renal origin: Secondary | ICD-10-CM | POA: Diagnosis not present

## 2021-12-08 DIAGNOSIS — E1122 Type 2 diabetes mellitus with diabetic chronic kidney disease: Secondary | ICD-10-CM | POA: Diagnosis not present

## 2021-12-08 DIAGNOSIS — N186 End stage renal disease: Secondary | ICD-10-CM | POA: Diagnosis not present

## 2021-12-08 DIAGNOSIS — Z992 Dependence on renal dialysis: Secondary | ICD-10-CM | POA: Diagnosis not present

## 2021-12-11 DIAGNOSIS — N2581 Secondary hyperparathyroidism of renal origin: Secondary | ICD-10-CM | POA: Diagnosis not present

## 2021-12-11 DIAGNOSIS — N186 End stage renal disease: Secondary | ICD-10-CM | POA: Diagnosis not present

## 2021-12-11 DIAGNOSIS — Z992 Dependence on renal dialysis: Secondary | ICD-10-CM | POA: Diagnosis not present

## 2021-12-13 DIAGNOSIS — Z992 Dependence on renal dialysis: Secondary | ICD-10-CM | POA: Diagnosis not present

## 2021-12-13 DIAGNOSIS — N186 End stage renal disease: Secondary | ICD-10-CM | POA: Diagnosis not present

## 2021-12-13 DIAGNOSIS — N2581 Secondary hyperparathyroidism of renal origin: Secondary | ICD-10-CM | POA: Diagnosis not present

## 2021-12-15 DIAGNOSIS — N186 End stage renal disease: Secondary | ICD-10-CM | POA: Diagnosis not present

## 2021-12-15 DIAGNOSIS — Z992 Dependence on renal dialysis: Secondary | ICD-10-CM | POA: Diagnosis not present

## 2021-12-15 DIAGNOSIS — N2581 Secondary hyperparathyroidism of renal origin: Secondary | ICD-10-CM | POA: Diagnosis not present

## 2021-12-18 DIAGNOSIS — Z992 Dependence on renal dialysis: Secondary | ICD-10-CM | POA: Diagnosis not present

## 2021-12-18 DIAGNOSIS — N2581 Secondary hyperparathyroidism of renal origin: Secondary | ICD-10-CM | POA: Diagnosis not present

## 2021-12-18 DIAGNOSIS — N186 End stage renal disease: Secondary | ICD-10-CM | POA: Diagnosis not present

## 2021-12-19 ENCOUNTER — Ambulatory Visit: Payer: Medicare HMO | Admitting: Nurse Practitioner

## 2021-12-19 ENCOUNTER — Encounter: Payer: Self-pay | Admitting: Nurse Practitioner

## 2021-12-19 VITALS — BP 128/64 | HR 68 | Ht 63.0 in | Wt 222.0 lb

## 2021-12-19 DIAGNOSIS — I2489 Other forms of acute ischemic heart disease: Secondary | ICD-10-CM

## 2021-12-19 DIAGNOSIS — E1121 Type 2 diabetes mellitus with diabetic nephropathy: Secondary | ICD-10-CM | POA: Diagnosis not present

## 2021-12-19 DIAGNOSIS — E782 Mixed hyperlipidemia: Secondary | ICD-10-CM

## 2021-12-19 DIAGNOSIS — I248 Other forms of acute ischemic heart disease: Secondary | ICD-10-CM | POA: Diagnosis not present

## 2021-12-19 DIAGNOSIS — N186 End stage renal disease: Secondary | ICD-10-CM

## 2021-12-19 DIAGNOSIS — Z992 Dependence on renal dialysis: Secondary | ICD-10-CM

## 2021-12-19 DIAGNOSIS — I5032 Chronic diastolic (congestive) heart failure: Secondary | ICD-10-CM | POA: Diagnosis not present

## 2021-12-19 DIAGNOSIS — I10 Essential (primary) hypertension: Secondary | ICD-10-CM

## 2021-12-19 DIAGNOSIS — R072 Precordial pain: Secondary | ICD-10-CM

## 2021-12-19 NOTE — Progress Notes (Signed)
Office Visit    Patient Name: Carrie [name] Date of Encounter: 12/19/2021  Primary Care Provider:  Venia Carbon, MD Primary Cardiologist:  Ida Rogue, MD  Chief Complaint    71 year old female with a history of HFpEF, hypertension, hyperlipidemia, type 2 diabetes mellitus, anemia, end-stage renal disease, obesity, seizures, and meningioma, who presents for follow-up of HFpEF.  Past Medical History    Past Medical History:  Diagnosis Date   Allergy    Anemia    Arthritis    "right knee" (10/26'/2017)   Chronic heart failure with preserved ejection fraction (HFpEF) ([place])    a. 04/2021 Echo: EF >55%, GrI DD, nl RV fxn, triv MR; b. 12/2021 Echo: EF 60-65%, no rwma.   Demand ischemia ([place])    a. 04/2021 HsTrop up to 73 in setting of volume overload; b. 10/2021 MV: EF 66% with small, mild reversible defect in the apical septal and mid anteroseptal myocardium which was felt to most likely represent artifact.   Depression    ESRD (end stage renal disease) ([place])    a. OnHD (Dr. Lavonia Dana)   History of blood transfusion 2008   "when I had brain tumor surgery"   History of MRSA infection 2008   History of stress test    a. 07/2018 MV: EF 66%, no ischemia/infarct.   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 12/2021 Echo: EF 60-65%, no rwma, mild LVH, nl RV fxn, small pericardial effusion - small pocket of post wall, ~ 1cm.   Precordial chest pain    a. 10/2021 MV: low risk.   Type II diabetes mellitus ([place]) 2011   on metformin until yr ago- no med now - off due to kidneys   Past Surgical History:  Procedure Laterality Date   A/V FISTULAGRAM Left 03/10/2019   Procedure: A/V FISTULAGRAM;  Surgeon: Katha Cabal, MD;  Location: [facility];  Service: Cardiovascular;  Laterality: Left;   A/V FISTULAGRAM Left 07/14/2019   Procedure: A/V FISTULAGRAM;  Surgeon: Katha Cabal, MD;  Location: [facility];   Service: Cardiovascular;  Laterality: Left;   A/V FISTULAGRAM Left 01/07/2020   Procedure: A/V FISTULAGRAM;  Surgeon: Katha Cabal, MD;  Location: [facility];  Service: Cardiovascular;  Laterality: Left;   A/V FISTULAGRAM Left 11/14/2021   Procedure: A/V Fistulagram;  Surgeon: Katha Cabal, MD;  Location: [facility];  Service: Cardiovascular;  Laterality: Left;   AV FISTULA PLACEMENT Left 11/07/2018   Procedure: ARTERIOVENOUS (AV) FISTULA CREATION ( BRACHIO BASILIC ) VS BRACHIAL AXILLARY GRAFT;  Surgeon: Katha Cabal, MD;  Location: [facility];  Service: Vascular;  Laterality: Left;   BRAIN MENINGIOMA EXCISION  08/2006   Foramina magnum meningioma   CERVICAL DISCECTOMY  1996   Dr. Alric Seton   CESAREAN SECTION  1981   COLONOSCOPY     COLONOSCOPY WITH PROPOFOL N/A 03/07/2021   Procedure: COLONOSCOPY WITH PROPOFOL;  Surgeon: Virgel Manifold, MD;  Location: [facility];  Service: Endoscopy;  Laterality: N/A;   DIALYSIS/PERMA CATHETER INSERTION N/A 08/04/2018   Procedure: DIALYSIS/PERMA CATHETER INSERTION;  Surgeon: Algernon Huxley, MD;  Location: [facility];  Service: Cardiovascular;  Laterality: N/A;   DIALYSIS/PERMA CATHETER REMOVAL N/A 12/25/2018   Procedure: DIALYSIS/PERMA CATHETER REMOVAL;  Surgeon: Algernon Huxley, MD;  Location: [facility];  Service: Cardiovascular;  Laterality: N/A;   DILATION  AND CURETTAGE OF UTERUS     JOINT REPLACEMENT     right knee left hip   Right wrist x-ray  2007   Deg. at 1st MCP   TOTAL HIP ARTHROPLASTY Left 03/27/2016   Procedure: LEFT TOTAL HIP ARTHROPLASTY ANTERIOR APPROACH;  Surgeon: Mcarthur Rossetti, MD;  Location: [facility];  Service: Orthopedics;  Laterality: Left;   TOTAL KNEE ARTHROPLASTY Right 04/30/2017   Procedure: RIGHT TOTAL KNEE ARTHROPLASTY;  Surgeon: Mcarthur Rossetti, MD;  Location: [facility];  Service: Orthopedics;  Laterality: Right;   TUBAL LIGATION  1981    Allergies  Allergies   Allergen Reactions   Naproxen Sodium Anaphylaxis and Swelling   Sulfamethoxazole-Trimethoprim Anaphylaxis and Swelling    History of Present Illness    71 year old female with the above complex past medical history including HFpEF, hypertension, hyperlipidemia, type 2 diabetes mellitus, anemia, end-stage renal disease, obesity, meningioma, and seizures.  Echocardiogram in May 2019, showed an EF of 60 to 65% with grade 1 diastolic dysfunction and mild aortic stenosis.  She previously underwent stress testing February 2020, which was low risk without ischemia or infarct.  In November 2022, she was admitted to [facility] with dyspnea and volume overload.  VQ scan was low probability for PE and lower extremity ultrasounds were negative for DVT.  She was dialyzed with improvement in volume status.  Echo during admission showed an EF of greater than 55% with grade 1 diastolic dysfunction, normal RV function, and trivial MR.  During hospitalization, high-sensitivity troponin did elevate to a peak of 73.  At cardiology visit in May 2023, she noted occasional chest pressure.  In light of symptoms and previous demand ischemia, she underwent a Lexiscan Myoview, which was low risk with small, mild, reversible defect in the apical septal and mid anteroseptal myocardium which was favored to represent artifact or ischemia.  Imaging also showed fluid density structures adjacent to the right atrium and left lateral wall of the left ventricle, most consistent with loculated pericardial effusion.  In that setting, echocardiogram was performed which showed an EF of 60 to 65% with a small pericardial effusion of the posterior wall.  Since her last visit, Ms. Seyller required PTA of her left arm brachial axillary AV hemodialysis access in June.  Since then, she has done well.  She feels well and denies chest pain, dyspnea, or edema.  She tolerates dialysis well without any hypotension.  In fact, her losartan dose was recently changed  to 100 mg daily, including dialysis days, with significant improvement in blood pressures overall.  She denies palpitations, PND, orthopnea, dizziness, syncope, or early satiety.  Home Medications    Current Outpatient Medications  Medication Sig Dispense Refill   acetaminophen (TYLENOL) 500 MG tablet Take 1,000 mg by mouth every 6 (six) hours as needed for mild pain or moderate pain.     albuterol (VENTOLIN HFA) 108 (90 Base) MCG/ACT inhaler Inhale 2 puffs into the lungs every 6 (six) hours as needed for wheezing or shortness of breath. 8 g 2   amLODipine (NORVASC) 10 MG tablet Take 10 mg by mouth daily.     aspirin EC 81 MG tablet Take 81 mg by mouth daily.     atorvastatin (LIPITOR) 40 MG tablet TAKE 40 MG BY MOUTH ONCE DAILY (Patient taking differently: Take 40 mg by mouth daily.) 90 tablet 3   b complex vitamins capsule Take 1 capsule by mouth daily.     carvedilol (COREG) 12.5 MG tablet Take 1 tablet (  12.5 mg total) by mouth 2 (two) times daily. 180 tablet 3   levETIRAcetam (KEPPRA) 1000 MG tablet Take 1,000 mg by mouth 2 (two) times daily.     lidocaine-prilocaine (EMLA) cream Apply 1 application topically every Monday, Wednesday, and Friday with hemodialysis.     LORazepam (ATIVAN) 0.5 MG tablet TAKE 1 TABLET BY MOUTH TWICE A DAY AS NEEDED FOR ANXIETY 60 tablet 0   losartan (COZAAR) 25 MG tablet Take 1-2 tablets (25-50 mg total) by mouth See admin instructions. Take 1 tablet ('25mg'$ ) by mouth daily on Tuesday, Thursday, Saturday and Sunday and take 2 tablets ('50mg'$ ) by mouth daily on Monday, Wednesday and Friday before dialysis 90 tablet 3   multivitamin (RENA-VIT) TABS tablet Take 1 tablet by mouth at bedtime. 30 tablet 0   omeprazole (PRILOSEC) 20 MG capsule TAKE 1 CAPSULE BY MOUTH EVERY DAY 90 capsule 3   sevelamer carbonate (RENVELA) 800 MG tablet Take 800-1,600 mg by mouth See admin instructions. Take 2 tablets (1600 mg) by mouth three times daily with meals and take 1 tablet (800 mg) by  mouth twice daily with snacks     torsemide (DEMADEX) 20 MG tablet Take 20 mg by mouth 4 (four) times a week. Every day (including on dialysis days)     No current facility-administered medications for this visit.     Review of Systems    Feeling exceptionally well since her last visit.  She denies chest pain, palpitations, dyspnea, pnd, orthopnea, n, v, dizziness, syncope, edema, weight gain, or early satiety.  All other systems reviewed and are otherwise negative except as noted above.    Physical Exam    VS:  BP 128/64 (BP Location: Right Arm, Patient Position: Sitting, Cuff Size: Large)   Pulse 68   Ht '5\' 3"'$  (1.6 m)   Wt 222 lb (100.7 kg)   SpO2 98%   BMI 39.33 kg/m  , BMI Body mass index is 39.33 kg/m.     GEN: Well nourished, well developed, in no acute distress. HEENT: normal. Neck: Supple, no JVD, carotid bruits, or masses. Cardiac: RRR, 2/6 systolic murmur at the upper sternal borders.  No rubs or gallops.. No clubbing, cyanosis, edema.  Radials/PT 2+ and equal bilaterally.  Left upper extremity AV graft with +bruit/thrill. Respiratory:  Respirations regular and unlabored, clear to auscultation bilaterally. GI: Obese, soft, nontender, nondistended, BS + x 4. MS: no deformity or atrophy. Skin: warm and dry, no rash. Neuro:  Strength and sensation are intact. Psych: Normal affect.  Accessory Clinical Findings    Lab Results  Component Value Date   WBC 4.2 05/04/2021   HGB 10.0 (L) 05/04/2021   HCT 30.8 (L) 05/04/2021   MCV 94.8 05/04/2021   PLT 176 05/04/2021   Lab Results  Component Value Date   CREATININE 8.77 (H) 05/05/2021   BUN 56 (H) 05/05/2021   NA 134 (L) 05/05/2021   K 3.9 05/05/2021   CL 95 (L) 05/05/2021   CO2 29 05/05/2021   Lab Results  Component Value Date   ALT 12 05/02/2021   AST 14 (L) 05/02/2021   ALKPHOS 95 05/02/2021   BILITOT 0.7 05/02/2021   Lab Results  Component Value Date   CHOL 178 12/20/2017   HDL 48.70 12/20/2017    LDLCALC 113 (H) 12/20/2017   LDLDIRECT 197.0 07/15/2009   TRIG 85.0 12/20/2017   CHOLHDL 4 12/20/2017    Lab Results  Component Value Date   HGBA1C 5.4% 01/20/2021  Assessment & Plan    1.  Demand ischemia/precordial chest pain: Patient with mild troponin elevation in November in the setting of volume overload.  At last visit, she reported some dyspnea on exertion and occasional chest pressure.  Lexiscan Myoview was performed and was low risk.  Follow-up limited echo showed normal LV function with a small pericardial effusion.  She has been feeling well since her last visit and denies chest pain or dyspnea.  She is tolerating hemodialysis well.  She remains on aspirin, statin, beta-blocker, and ARB.  2.  Essential hypertension: She reports significant improvement in blood pressure following change from metoprolol to carvedilol and further titration of losartan to 100 mg daily.  Pressures stable today at 128/64.  Continue current regimen.  3.  Chronic HFpEF: She has been feeling well over the past 3 months.  Recent limited echo with normal LV function and only a small pericardial effusion.  She is euvolemic on examination today with stable heart rate and blood pressure.  Volume management per nephrology/hemodialysis.  4.  Hyperlipidemia: She remains on statin therapy but is not fasting today.  This can be checked with annual lab work in the future.  5.  End-stage renal disease: Managed by nephrology.  She has been tolerating HD well.  6.  Type 2 diabetes mellitus: A1c 5.4 last August.  Management.  7.  Disposition: Follow-up in 6 months or sooner if necessary.   Murray Hodgkins, NP 12/19/2021, 10:26 AM

## 2021-12-19 NOTE — Patient Instructions (Signed)
Medication Instructions:   Your physician recommends that you continue on your current medications as directed. Please refer to the Current Medication list given to you today.  *If you need a refill on your cardiac medications before your next appointment, please call your pharmacy*   Lab Work:  None ordered  Testing/Procedures:  None ordered   Follow-Up: At [facility], you and your health needs are our priority.  As part of our continuing mission to provide you with exceptional heart care, we have created designated Provider Care Teams.  These Care Teams include your primary Cardiologist (physician) and Advanced Practice Providers (APPs -  Physician Assistants and Nurse Practitioners) who all work together to provide you with the care you need, when you need it.  We recommend signing up for the patient portal called "MyChart".  Sign up information is provided on this After Visit Summary.  MyChart is used to connect with patients for Virtual Visits (Telemedicine).  Patients are able to view lab/test results, encounter notes, upcoming appointments, etc.  Non-urgent messages can be sent to your provider as well.   To learn more about what you can do with MyChart, go to [email or web address].    Your next appointment:   6 month(s)  The format for your next appointment:   In Person  Provider:   Ida Rogue, MD{   Important Information About Sugar

## 2021-12-20 ENCOUNTER — Other Ambulatory Visit (INDEPENDENT_AMBULATORY_CARE_PROVIDER_SITE_OTHER): Payer: Self-pay | Admitting: Vascular Surgery

## 2021-12-20 DIAGNOSIS — Z9862 Peripheral vascular angioplasty status: Secondary | ICD-10-CM

## 2021-12-20 DIAGNOSIS — N186 End stage renal disease: Secondary | ICD-10-CM | POA: Diagnosis not present

## 2021-12-20 DIAGNOSIS — N2581 Secondary hyperparathyroidism of renal origin: Secondary | ICD-10-CM | POA: Diagnosis not present

## 2021-12-20 DIAGNOSIS — Z992 Dependence on renal dialysis: Secondary | ICD-10-CM | POA: Diagnosis not present

## 2021-12-21 ENCOUNTER — Ambulatory Visit (INDEPENDENT_AMBULATORY_CARE_PROVIDER_SITE_OTHER): Payer: Medicare HMO | Admitting: Nurse Practitioner

## 2021-12-21 ENCOUNTER — Ambulatory Visit (INDEPENDENT_AMBULATORY_CARE_PROVIDER_SITE_OTHER): Payer: Medicare HMO

## 2021-12-21 ENCOUNTER — Encounter (INDEPENDENT_AMBULATORY_CARE_PROVIDER_SITE_OTHER): Payer: Self-pay | Admitting: Nurse Practitioner

## 2021-12-21 VITALS — BP 136/71 | HR 73 | Resp 16 | Wt 219.8 lb

## 2021-12-21 DIAGNOSIS — N186 End stage renal disease: Secondary | ICD-10-CM | POA: Diagnosis not present

## 2021-12-21 DIAGNOSIS — I10 Essential (primary) hypertension: Secondary | ICD-10-CM

## 2021-12-21 DIAGNOSIS — Z9862 Peripheral vascular angioplasty status: Secondary | ICD-10-CM | POA: Diagnosis not present

## 2021-12-21 DIAGNOSIS — E785 Hyperlipidemia, unspecified: Secondary | ICD-10-CM | POA: Diagnosis not present

## 2021-12-21 NOTE — Progress Notes (Signed)
Subjective:    Patient ID: Carrie [name], female    DOB: 01/09/1951, 71 y.o.   MRN: [id number] Chief Complaint  Patient presents with   Follow-up    [facility] 3 week follow up    The patient returns to the office for followup status post intervention of their dialysis access 11/14/2021.   Following the intervention the access function has significantly improved, with better flow rates and improved KT/V. The patient has not been experiencing increased bleeding times following decannulation and the patient denies increased recirculation. The patient denies an increase in arm swelling. At the present time the patient denies hand pain.  No recent shortening of the patient's walking distance or new symptoms consistent with claudication.  No history of rest pain symptoms. No new ulcers or wounds of the lower extremities have occurred.  The patient denies amaurosis fugax or recent TIA symptoms. There are no recent neurological changes noted. There is no history of DVT, PE or superficial thrombophlebitis. No recent episodes of angina or shortness of breath documented.   Duplex ultrasound of the AV access shows a patent access.  The previously noted stenosis is improved compared to last study.  Flow volume today is 1296 cc/min (previous flow volume was 2810 cc/min)        Review of Systems  Hematological:  Does not bruise/bleed easily.  All other systems reviewed and are negative.      Objective:   Physical Exam Vitals reviewed.  HENT:     Head: Normocephalic.  Cardiovascular:     Rate and Rhythm: Normal rate.     Pulses: Normal pulses.     Arteriovenous access: Left arteriovenous access is present.    Comments: Good thrill and bruit Pulmonary:     Effort: Pulmonary effort is normal.  Skin:    General: Skin is warm and dry.  Neurological:     Mental Status: She is alert and oriented to person, place, and time.  Psychiatric:        Mood and Affect: Mood normal.        Behavior:  Behavior normal.        Thought Content: Thought content normal.        Judgment: Judgment normal.     BP 136/71 (BP Location: Right Arm)   Pulse 73   Resp 16   Wt 219 lb 12.8 oz (99.7 kg)   BMI 38.94 kg/m   Past Medical History:  Diagnosis Date   Allergy    Anemia    Arthritis    "right knee" (10/26'/2017)   Chronic heart failure with preserved ejection fraction (HFpEF) ([place])    a. 04/2021 Echo: EF >55%, GrI DD, nl RV fxn, triv MR; b. 12/2021 Echo: EF 60-65%, no rwma.   Demand ischemia ([place])    a. 04/2021 HsTrop up to 73 in setting of volume overload; b. 10/2021 MV: EF 66% with small, mild reversible defect in the apical septal and mid anteroseptal myocardium which was felt to most likely represent artifact.   Depression    ESRD (end stage renal disease) ([place])    a. OnHD (Dr. Lavonia Dana)   History of blood transfusion 2008   "when I had brain tumor surgery"   History of MRSA infection 2008   History of stress test    a. 07/2018 MV: EF 66%, no ischemia/infarct.   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 12/2021 Echo:  EF 60-65%, no rwma, mild LVH, nl RV fxn, small pericardial effusion - small pocket of post wall, ~ 1cm.   Precordial chest pain    a. 10/2021 MV: low risk.   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      No living will   Requests niece, [name], as health care POA   Would accept resuscitation attempts   No tube feeds if cognitively aware   Social Determinants of Health   Financial Resource Strain: Low Risk  (12/06/2020)   Overall Financial Resource Strain (CARDIA)    Difficulty of Paying Living Expenses: Not hard at all  Food Insecurity: No Food Insecurity (12/06/2020)   Hunger Vital Sign    Worried About Running Out of Food in the Last Year: Never true    [place] in the Last Year: Never true  Transportation Needs: No Transportation Needs (12/06/2020)   PRAPARE - Hydrologist (Medical): No    Lack of Transportation (Non-Medical): No  Physical Activity: Not on file  Stress: Not on file  Social Connections: Not on file  Intimate Partner Violence: Not on file    Past Surgical History:  Procedure Laterality Date   A/V FISTULAGRAM Left 03/10/2019   Procedure: A/V FISTULAGRAM;  Surgeon: Katha Cabal, MD;  Location: [place] [facility];  Service: Cardiovascular;  Laterality: Left;   A/V FISTULAGRAM Left 07/14/2019   Procedure: A/V FISTULAGRAM;  Surgeon: Katha Cabal, MD;  Location: [place] [facility];  Service: Cardiovascular;  Laterality: Left;   A/V FISTULAGRAM Left 01/07/2020   Procedure: A/V FISTULAGRAM;  Surgeon: Katha Cabal, MD;  Location: [place] [facility];  Service: Cardiovascular;  Laterality: Left;   A/V FISTULAGRAM Left 11/14/2021   Procedure: A/V Fistulagram;  Surgeon: Katha Cabal, MD;  Location: [place] [facility];  Service: Cardiovascular;  Laterality: Left;   AV FISTULA PLACEMENT Left 11/07/2018   Procedure: ARTERIOVENOUS (AV) FISTULA CREATION ( BRACHIO BASILIC ) VS BRACHIAL AXILLARY GRAFT;  Surgeon: Katha Cabal, MD;  Location: [facility];  Service: Vascular;  Laterality: Left;   BRAIN MENINGIOMA  EXCISION  08/2006   Foramina magnum meningioma   CERVICAL DISCECTOMY  1996   Dr. Alric Seton   CESAREAN SECTION  1981   COLONOSCOPY     COLONOSCOPY WITH PROPOFOL N/A 03/07/2021   Procedure: COLONOSCOPY WITH PROPOFOL;  Surgeon: Virgel Manifold, MD;  Location: [facility];  Service: Endoscopy;  Laterality: N/A;   DIALYSIS/PERMA CATHETER INSERTION N/A 08/04/2018   Procedure: DIALYSIS/PERMA CATHETER INSERTION;  Surgeon: Algernon Huxley, MD;  Location: [place]  [facility];  Service: Cardiovascular;  Laterality: N/A;   DIALYSIS/PERMA CATHETER REMOVAL N/A 12/25/2018   Procedure: DIALYSIS/PERMA CATHETER REMOVAL;  Surgeon: Algernon Huxley, MD;  Location: [place] [facility];  Service: Cardiovascular;  Laterality: N/A;   DILATION AND CURETTAGE OF UTERUS     JOINT REPLACEMENT     right knee left hip   Right wrist x-ray  2007   Deg. at 1st MCP   TOTAL HIP ARTHROPLASTY Left 03/27/2016   Procedure: LEFT TOTAL HIP ARTHROPLASTY ANTERIOR APPROACH;  Surgeon: Mcarthur Rossetti, MD;  Location: [place];  Service: Orthopedics;  Laterality: Left;   TOTAL KNEE ARTHROPLASTY Right 04/30/2017   Procedure: RIGHT TOTAL KNEE ARTHROPLASTY;  Surgeon: Mcarthur Rossetti, MD;  Location: [facility];  Service: Orthopedics;  Laterality: Right;   TUBAL LIGATION  1981    Family History  Problem Relation Age of Onset   Hypertension Other        Strong family history    Breast cancer Mother 66   Heart failure Sister    Heart disease Sister     Allergies  Allergen Reactions   Naproxen Sodium Anaphylaxis and Swelling   Sulfamethoxazole-Trimethoprim Anaphylaxis and Swelling       Latest Ref Rng & Units 05/04/2021    5:34 AM 05/02/2021    5:24 AM 05/01/2021   10:43 AM  CBC  WBC 4.0 - 10.5 K/uL 4.2  5.5  6.8   Hemoglobin 12.0 - 15.0 g/dL 10.0  9.7  9.8   Hematocrit 36.0 - 46.0 % 30.8  30.9  31.2   Platelets 150 - 400 K/uL 176  170  194       CMP     Component Value Date/Time   NA 134 (L) 05/05/2021 0458   K  3.9 05/05/2021 0458   CL 95 (L) 05/05/2021 0458   CO2 29 05/05/2021 0458   GLUCOSE 87 05/05/2021 0458   BUN 56 (H) 05/05/2021 0458   BUN 36 (A) 07/08/2017 0000   CREATININE 8.77 (H) 05/05/2021 0458   CALCIUM 8.3 (L) 05/05/2021 0458   PROT 7.3 05/02/2021 0524   ALBUMIN 3.6 05/02/2021 0524   AST 14 (L) 05/02/2021 0524   ALT 12 05/02/2021 0524   ALKPHOS 95 05/02/2021 0524   BILITOT 0.7 05/02/2021 0524   GFRNONAA 4 (L) 05/05/2021 0458   GFRAA 9 (L) 11/04/2018 1208     No results found.     Assessment & Plan:   1. ESRD (end stage renal disease) ([place]) Recommend:  The patient is doing well and currently has adequate dialysis access. The patient's dialysis center is not reporting any access issues. Flow pattern is stable when compared to the prior ultrasound.  The patient should have a duplex ultrasound of the dialysis access in 6 months. The patient will follow-up with me in the office after each ultrasound     2. Hyperlipidemia, unspecified hyperlipidemia type Continue statin as ordered and reviewed, no changes at this time   3. Essential hypertension, benign Continue antihypertensive medications as already ordered, these medications have been reviewed and there are no changes at this time.    Current Outpatient Medications on File Prior to Visit  Medication Sig Dispense Refill   acetaminophen (TYLENOL) 500 MG tablet Take 1,000 mg by mouth every 6 (six) hours as needed for mild pain or moderate pain.     albuterol (VENTOLIN HFA) 108 (90 Base) MCG/ACT inhaler Inhale 2 puffs into the lungs every 6 (six) hours as needed  for wheezing or shortness of breath. 8 g 2   amLODipine (NORVASC) 10 MG tablet Take 10 mg by mouth daily.     aspirin EC 81 MG tablet Take 81 mg by mouth daily.     atorvastatin (LIPITOR) 40 MG tablet TAKE 40 MG BY MOUTH ONCE DAILY (Patient taking differently: Take 40 mg by mouth daily.) 90 tablet 3   b complex vitamins capsule Take 1 capsule by mouth daily.      carvedilol (COREG) 12.5 MG tablet Take 1 tablet (12.5 mg total) by mouth 2 (two) times daily. 180 tablet 3   levETIRAcetam (KEPPRA) 1000 MG tablet Take 1,000 mg by mouth 2 (two) times daily.     lidocaine-prilocaine (EMLA) cream Apply 1 application topically every Monday, Wednesday, and Friday with hemodialysis.     LORazepam (ATIVAN) 0.5 MG tablet TAKE 1 TABLET BY MOUTH TWICE A DAY AS NEEDED FOR ANXIETY 60 tablet 0   losartan (COZAAR) 100 MG tablet Take 100 mg by mouth daily.     multivitamin (RENA-VIT) TABS tablet Take 1 tablet by mouth at bedtime. 30 tablet 0   omeprazole (PRILOSEC) 20 MG capsule TAKE 1 CAPSULE BY MOUTH EVERY DAY 90 capsule 3   sevelamer carbonate (RENVELA) 800 MG tablet Take 800-1,600 mg by mouth See admin instructions. Take 2 tablets (1600 mg) by mouth three times daily with meals and take 1 tablet (800 mg) by mouth twice daily with snacks     torsemide (DEMADEX) 20 MG tablet Take 20 mg by mouth 4 (four) times a week. Every day (including on dialysis days)     No current facility-administered medications on file prior to visit.    There are no Patient Instructions on file for this visit. No follow-ups on file.   Kris Hartmann, NP

## 2021-12-22 DIAGNOSIS — N2581 Secondary hyperparathyroidism of renal origin: Secondary | ICD-10-CM | POA: Diagnosis not present

## 2021-12-22 DIAGNOSIS — Z992 Dependence on renal dialysis: Secondary | ICD-10-CM | POA: Diagnosis not present

## 2021-12-22 DIAGNOSIS — N186 End stage renal disease: Secondary | ICD-10-CM | POA: Diagnosis not present

## 2021-12-25 DIAGNOSIS — N186 End stage renal disease: Secondary | ICD-10-CM | POA: Diagnosis not present

## 2021-12-25 DIAGNOSIS — N2581 Secondary hyperparathyroidism of renal origin: Secondary | ICD-10-CM | POA: Diagnosis not present

## 2021-12-25 DIAGNOSIS — Z992 Dependence on renal dialysis: Secondary | ICD-10-CM | POA: Diagnosis not present

## 2021-12-27 DIAGNOSIS — N2581 Secondary hyperparathyroidism of renal origin: Secondary | ICD-10-CM | POA: Diagnosis not present

## 2021-12-27 DIAGNOSIS — Z992 Dependence on renal dialysis: Secondary | ICD-10-CM | POA: Diagnosis not present

## 2021-12-27 DIAGNOSIS — N186 End stage renal disease: Secondary | ICD-10-CM | POA: Diagnosis not present

## 2021-12-28 ENCOUNTER — Ambulatory Visit (INDEPENDENT_AMBULATORY_CARE_PROVIDER_SITE_OTHER): Payer: Medicare HMO

## 2021-12-28 DIAGNOSIS — N2581 Secondary hyperparathyroidism of renal origin: Secondary | ICD-10-CM | POA: Diagnosis not present

## 2021-12-28 DIAGNOSIS — G8929 Other chronic pain: Secondary | ICD-10-CM

## 2021-12-28 DIAGNOSIS — N186 End stage renal disease: Secondary | ICD-10-CM

## 2021-12-28 DIAGNOSIS — I10 Essential (primary) hypertension: Secondary | ICD-10-CM

## 2021-12-28 DIAGNOSIS — Z992 Dependence on renal dialysis: Secondary | ICD-10-CM | POA: Diagnosis not present

## 2021-12-28 DIAGNOSIS — I5032 Chronic diastolic (congestive) heart failure: Secondary | ICD-10-CM

## 2021-12-28 NOTE — Patient Instructions (Addendum)
Visit Information  Thank you for taking time to visit with me today. Please don't hesitate to contact me if I can be of assistance to you before our next scheduled telephone appointment.  Following are the goals we discussed today:  Attend all scheduled provider appointments  Call provider office for new concerns or questions  Check blood pressure and oxygen levels at least 2-3 times per week and record. write blood pressure results in a log or diary (take log to your appointments to share information with your provider) Continue to follow a low sodium/ DASH diet and adhere to fluid restriction as recommended by your doctor.   Continue to weight  and check blood pressure at home on non dialysis days and record   Continue to elevate legs when sitting due to knee pain.  Apply ice for pain management.  Continue to take medications as prescribed and refill timely  Our next appointment is by telephone on 01/30/22 at 1:00 pm  Please call the care guide team at [phone] if you need to cancel or reschedule your appointment.   If you are experiencing a Mental Health or [place] or need someone to talk to, please call the Suicide and Crisis Lifeline: 988 call 1-800-273-TALK (toll free, 24 hour hotline)   Patient verbalizes understanding of instructions and care plan provided today and agrees to view in [place]. Active MyChart status and patient understanding of how to access instructions and care plan via MyChart confirmed with patient.     Quinn Plowman RN,BSN,CCM RN Care Manager Coordinator [place]  [phone]

## 2021-12-28 NOTE — Chronic Care Management (AMB) (Signed)
Care Management    RN Visit Note  12/28/2021 Name: Carrie [name] MRN: [id number] DOB: 1950/09/29  Subjective: Carrie [name] is a 71 y.o. year old female who is a primary care patient of Carrie Carbon, MD. The care management team was consulted for assistance with disease management and care coordination needs.    Engaged with patient by telephone for follow up visit in response to provider referral for case management and/or care coordination services.   Consent to Services:   Carrie [name] was given information about Care Management services today including:  Care Management services includes personalized support from designated clinical staff supervised by her physician, including individualized plan of care and coordination with other care providers 24/7 contact phone numbers for assistance for urgent and routine care needs. The patient may stop case management services at any time by phone call to the office staff.  Patient agreed to services and consent obtained.   Assessment: Review of patient past medical history, allergies, medications, health status, including review of consultants reports, laboratory and other test data, was performed as part of comprehensive evaluation and provision of chronic care management services.   SDOH (Social Determinants of Health) assessments and interventions performed:    Care Plan  Allergies  Allergen Reactions   Naproxen Sodium Anaphylaxis and Swelling   Sulfamethoxazole-Trimethoprim Anaphylaxis and Swelling    Outpatient Encounter Medications as of 12/28/2021  Medication Sig   acetaminophen (TYLENOL) 500 MG tablet Take 1,000 mg by mouth every 6 (six) hours as needed for mild pain or moderate pain.   albuterol (VENTOLIN HFA) 108 (90 Base) MCG/ACT inhaler Inhale 2 puffs into the lungs every 6 (six) hours as needed for wheezing or shortness of breath.   amLODipine (NORVASC) 10 MG tablet Take 10 mg by mouth daily.   aspirin EC 81 MG  tablet Take 81 mg by mouth daily.   atorvastatin (LIPITOR) 40 MG tablet TAKE 40 MG BY MOUTH ONCE DAILY (Patient taking differently: Take 40 mg by mouth daily.)   b complex vitamins capsule Take 1 capsule by mouth daily.   carvedilol (COREG) 12.5 MG tablet Take 1 tablet (12.5 mg total) by mouth 2 (two) times daily.   levETIRAcetam (KEPPRA) 1000 MG tablet Take 1,000 mg by mouth 2 (two) times daily.   lidocaine-prilocaine (EMLA) cream Apply 1 application topically every Monday, Wednesday, and Friday with hemodialysis.   LORazepam (ATIVAN) 0.5 MG tablet TAKE 1 TABLET BY MOUTH TWICE A DAY AS NEEDED FOR ANXIETY   losartan (COZAAR) 100 MG tablet Take 100 mg by mouth daily.   multivitamin (RENA-VIT) TABS tablet Take 1 tablet by mouth at bedtime.   omeprazole (PRILOSEC) 20 MG capsule TAKE 1 CAPSULE BY MOUTH EVERY DAY   sevelamer carbonate (RENVELA) 800 MG tablet Take 800-1,600 mg by mouth See admin instructions. Take 2 tablets (1600 mg) by mouth three times daily with meals and take 1 tablet (800 mg) by mouth twice daily with snacks   torsemide (DEMADEX) 20 MG tablet Take 20 mg by mouth 4 (four) times a week. Every day (including on dialysis days)   No facility-administered encounter medications on file as of 12/28/2021.    Patient Active Problem List   Diagnosis Date Noted   Chronic diastolic heart failure ([place]) 05/17/2021   Seizure disorder ([place])    Hyperlipidemia    SOB (shortness of breath) 05/01/2021   Anemia 05/01/2021   Chronic hypoxemic respiratory failure ([place]) 05/01/2021   Positive FIT (fecal immunochemical test)  Polyp of ascending colon    Polyp of descending colon    Polyp of transverse colon    Dysphagia 02/07/2021   Tremor 12/01/2020   Headache 12/01/2020   Eustachian tube dysfunction 10/25/2020   History of shingles 09/26/2020   Primary osteoarthritis of left knee 93/79/0240   Complication from renal dialysis device 11/20/2018   ESRD on hemodialysis ([place]) 08/04/2018    Cough 10/01/2017   Status post total right knee replacement 04/30/2017   Unilateral primary osteoarthritis, right knee 03/18/2017   History of total hip replacement, left 03/18/2017   Unilateral primary osteoarthritis, left hip 03/27/2016   Status post total replacement of left hip 03/27/2016   Mood disorder ([place]) 04/01/2015   Osteoarthritis of right knee 02/03/2014   Advanced directives, counseling/discussion 02/03/2014   Routine general medical examination at a health care facility 07/08/2012   Obesity, Class III, BMI 40-49.9 (morbid obesity) ([place]) 07/08/2012   Type 2 diabetes, controlled, with renal manifestation ([name])    Otitis externa 03/20/2010   HYPERCHOLESTEROLEMIA 09/26/2006   Essential hypertension 09/26/2006   ALLERGIC RHINITIS 09/26/2006   History of meningioma 08/03/2006    Conditions to be addressed/monitored: CHF, HTN, ESRD, and chronic pain  Care Plan : RN Case Manager Plan of care  Updates made by Dannielle Karvonen, RN since 12/28/2021 12:00 AM     Problem: Knowledge deficit related to long term care plan for management of chronic conditions ( HTN, HF, ESRD)   Priority: High     Long-Range Goal: Chronic conditions monitored and managed ( HTN, HF, ESRD   Start Date: 04/11/2021  Expected End Date: 02/01/2022  Priority: High  Note:   Current Barriers:  Knowledge Deficits related to plan of care for management of CHF, HTN, and ESRD  Chronic Disease Management support and education needs related to CHF, HTN, and ESRD Patient states her stress test results were good. She reports having a follow up visit with the vascular doctor on 12/21/21 post her dialysis access surgery. She states her access patency is improved.  She reports having a follow up visit with care cardiologist on 12/19/21. She states her appointment went well and denies any changes to her treatment plan.  Patient denies any increase in heart failure symptoms. She reports her blood pressure today was 137/47.   Denies any dizziness or lightheadedness.  Patient states her weight today was 216 lbs.  She states her knee pain is a 10 but she is managing with tylenol, ice and elevation.      RNCM Clinical Goal(s):  Patient will verbalize basic understanding of CHF, HTN, and ESRD disease process and self health management plan as evidenced by weighing at least 3 times per week, taking medications as prescribed, monitoring blood pressure at least 2-3 days per week and recording.  take all medications exactly as prescribed and will call provider for medication related questions as evidenced by      attend all scheduled medical appointments:   as evidenced by patient reporting they have attended appointments.         continue to work with Consulting civil engineer and/or Social Worker to address care management and care coordination needs related to CHF, HTN, and ESRD as evidenced by adherence to CM Team Scheduled appointments     through collaboration with RN Care manager, provider, and care team.   Interventions: 1:1 collaboration with primary care provider regarding development and update of comprehensive plan of care as evidenced by provider attestation and co-signature Inter-disciplinary care  team collaboration (see longitudinal plan of care) Evaluation of current treatment plan related to  self management and patient's adherence to plan as established by provider  Hypertension Interventions: Goal on track:  Long term  Last practice recorded BP readings:  BP Readings from Last 3 Encounters:  12/21/21 136/71  12/19/21 128/64  11/14/21 (!) 167/82  Most recent eGFR/CrCl: No results found for: "EGFR"  No components found for: "CRCL"  Evaluation of current treatment plan related to hypertension self management and patient's adherence to plan as established by provider; Reviewed medications with patient and discussed importance of compliance; Discussed plans with patient for ongoing care management follow up and provided  patient with direct contact information for care management team; Reviewed scheduled/upcoming provider appointments  Advised to continue to follow low salt diet. Advised to monitor blood pressure at least 1-2 times per week at home  Advised to  continue remain as active as possible.   Chronic Pain Interventions:  Goal met Pain assessment performed Medications reviewed and discussed importance of compliance.  Discussed importance of adherence to all scheduled medical appointments; Counseled on the importance of reporting any/all new or changed pain symptoms or management strategies to pain management provider; Reviewed with patient prescribed pharmacological and nonpharmacological pain relief strategies; Advised to elevate left leg when sitting.    ESRD Interventions:  Resolved due to duplicate goal  Reviewed medications with patient and discussed importance Reviewed scheduled/upcoming provider appointments  Discussed plans with patient for ongoing care management follow up and provided patient with direct contact information for care management team; Advised to continue to monitor weight and blood pressure at home on non dialysis days.   Heart Failure Interventions: Goal met.   Discussed heart failure action plan/ zones Discussed importance of daily weight and advised patient to weigh and record daily; Discussed the importance of keeping all appointments with provider;  Reviewed medications and discussed importance of compliance.  Reviewed heart failure signs/ symptoms.  Advised to continue to follow low salt diet and fluid restrictions as advised by provider.   Patient Goals/Self-Care Activities: Attend all scheduled provider appointments  Call provider office for new concerns or questions  Check blood pressure and oxygen levels at least 2-3 times per week and record. write blood pressure results in a log or diary (take log to your appointments to share information with your  provider) Continue to follow a low sodium/ DASH diet and adhere to fluid restriction as recommended by your doctor.   Continue to weight  and check blood pressure at home on non dialysis days and record   Continue to elevate legs when sitting due to knee pain.  Apply ice for pain management.  Continue to take medications as prescribed and refill timely       Plan:  .resolved due to duplicate goals  Quinn Plowman [facility] RN Care Manager Coordinator [place]  [phone]

## 2021-12-29 ENCOUNTER — Telehealth: Payer: Self-pay | Admitting: Internal Medicine

## 2021-12-29 DIAGNOSIS — N186 End stage renal disease: Secondary | ICD-10-CM | POA: Diagnosis not present

## 2021-12-29 DIAGNOSIS — N2581 Secondary hyperparathyroidism of renal origin: Secondary | ICD-10-CM | POA: Diagnosis not present

## 2021-12-29 DIAGNOSIS — Z992 Dependence on renal dialysis: Secondary | ICD-10-CM | POA: Diagnosis not present

## 2021-12-29 NOTE — Telephone Encounter (Signed)
Type of forms received:Parking Placard  Routed BW:LSLHTDS B  Paperwork received by : Gwynn Burly   Individual made aware of 3-5 business day turn around (Y/N): Y  Form completed and patient made aware of charges(Y/N): Y   Faxed to :   Form location: Place in PCP folder

## 2021-12-29 NOTE — Telephone Encounter (Signed)
Form placed in Dr Alla German inbox to sign when he returns next week.

## 2022-01-01 DIAGNOSIS — Z992 Dependence on renal dialysis: Secondary | ICD-10-CM | POA: Diagnosis not present

## 2022-01-01 DIAGNOSIS — I10 Essential (primary) hypertension: Secondary | ICD-10-CM

## 2022-01-01 DIAGNOSIS — I5032 Chronic diastolic (congestive) heart failure: Secondary | ICD-10-CM | POA: Diagnosis not present

## 2022-01-01 DIAGNOSIS — N2581 Secondary hyperparathyroidism of renal origin: Secondary | ICD-10-CM | POA: Diagnosis not present

## 2022-01-01 DIAGNOSIS — N186 End stage renal disease: Secondary | ICD-10-CM

## 2022-01-01 NOTE — Telephone Encounter (Signed)
Spoke to pt. I have put it in the mail to her at her request.

## 2022-01-02 DIAGNOSIS — N2581 Secondary hyperparathyroidism of renal origin: Secondary | ICD-10-CM | POA: Diagnosis not present

## 2022-01-02 DIAGNOSIS — Z992 Dependence on renal dialysis: Secondary | ICD-10-CM | POA: Diagnosis not present

## 2022-01-02 DIAGNOSIS — N186 End stage renal disease: Secondary | ICD-10-CM | POA: Diagnosis not present

## 2022-01-03 DIAGNOSIS — N186 End stage renal disease: Secondary | ICD-10-CM | POA: Diagnosis not present

## 2022-01-03 DIAGNOSIS — N2581 Secondary hyperparathyroidism of renal origin: Secondary | ICD-10-CM | POA: Diagnosis not present

## 2022-01-03 DIAGNOSIS — Z992 Dependence on renal dialysis: Secondary | ICD-10-CM | POA: Diagnosis not present

## 2022-01-05 DIAGNOSIS — N2581 Secondary hyperparathyroidism of renal origin: Secondary | ICD-10-CM | POA: Diagnosis not present

## 2022-01-05 DIAGNOSIS — Z992 Dependence on renal dialysis: Secondary | ICD-10-CM | POA: Diagnosis not present

## 2022-01-05 DIAGNOSIS — N186 End stage renal disease: Secondary | ICD-10-CM | POA: Diagnosis not present

## 2022-01-08 DIAGNOSIS — N2581 Secondary hyperparathyroidism of renal origin: Secondary | ICD-10-CM | POA: Diagnosis not present

## 2022-01-08 DIAGNOSIS — Z992 Dependence on renal dialysis: Secondary | ICD-10-CM | POA: Diagnosis not present

## 2022-01-08 DIAGNOSIS — N186 End stage renal disease: Secondary | ICD-10-CM | POA: Diagnosis not present

## 2022-01-09 ENCOUNTER — Other Ambulatory Visit: Payer: Self-pay | Admitting: Internal Medicine

## 2022-01-10 DIAGNOSIS — N2581 Secondary hyperparathyroidism of renal origin: Secondary | ICD-10-CM | POA: Diagnosis not present

## 2022-01-10 DIAGNOSIS — N186 End stage renal disease: Secondary | ICD-10-CM | POA: Diagnosis not present

## 2022-01-10 DIAGNOSIS — Z992 Dependence on renal dialysis: Secondary | ICD-10-CM | POA: Diagnosis not present

## 2022-01-10 NOTE — Telephone Encounter (Signed)
Refill request Lorazepam Last refill 11/28/21 #60 Last office visit 06/07/21 Upcoming appointment 02/20/22

## 2022-01-12 DIAGNOSIS — N186 End stage renal disease: Secondary | ICD-10-CM | POA: Diagnosis not present

## 2022-01-12 DIAGNOSIS — N2581 Secondary hyperparathyroidism of renal origin: Secondary | ICD-10-CM | POA: Diagnosis not present

## 2022-01-12 DIAGNOSIS — Z992 Dependence on renal dialysis: Secondary | ICD-10-CM | POA: Diagnosis not present

## 2022-01-15 DIAGNOSIS — N186 End stage renal disease: Secondary | ICD-10-CM | POA: Diagnosis not present

## 2022-01-15 DIAGNOSIS — N2581 Secondary hyperparathyroidism of renal origin: Secondary | ICD-10-CM | POA: Diagnosis not present

## 2022-01-15 DIAGNOSIS — Z992 Dependence on renal dialysis: Secondary | ICD-10-CM | POA: Diagnosis not present

## 2022-01-17 DIAGNOSIS — N2581 Secondary hyperparathyroidism of renal origin: Secondary | ICD-10-CM | POA: Diagnosis not present

## 2022-01-17 DIAGNOSIS — Z992 Dependence on renal dialysis: Secondary | ICD-10-CM | POA: Diagnosis not present

## 2022-01-17 DIAGNOSIS — N186 End stage renal disease: Secondary | ICD-10-CM | POA: Diagnosis not present

## 2022-01-19 DIAGNOSIS — N2581 Secondary hyperparathyroidism of renal origin: Secondary | ICD-10-CM | POA: Diagnosis not present

## 2022-01-19 DIAGNOSIS — Z992 Dependence on renal dialysis: Secondary | ICD-10-CM | POA: Diagnosis not present

## 2022-01-19 DIAGNOSIS — N186 End stage renal disease: Secondary | ICD-10-CM | POA: Diagnosis not present

## 2022-01-22 DIAGNOSIS — N186 End stage renal disease: Secondary | ICD-10-CM | POA: Diagnosis not present

## 2022-01-22 DIAGNOSIS — Z992 Dependence on renal dialysis: Secondary | ICD-10-CM | POA: Diagnosis not present

## 2022-01-22 DIAGNOSIS — N2581 Secondary hyperparathyroidism of renal origin: Secondary | ICD-10-CM | POA: Diagnosis not present

## 2022-01-24 DIAGNOSIS — N186 End stage renal disease: Secondary | ICD-10-CM | POA: Diagnosis not present

## 2022-01-24 DIAGNOSIS — Z992 Dependence on renal dialysis: Secondary | ICD-10-CM | POA: Diagnosis not present

## 2022-01-24 DIAGNOSIS — N2581 Secondary hyperparathyroidism of renal origin: Secondary | ICD-10-CM | POA: Diagnosis not present

## 2022-01-26 DIAGNOSIS — N2581 Secondary hyperparathyroidism of renal origin: Secondary | ICD-10-CM | POA: Diagnosis not present

## 2022-01-26 DIAGNOSIS — N186 End stage renal disease: Secondary | ICD-10-CM | POA: Diagnosis not present

## 2022-01-26 DIAGNOSIS — Z992 Dependence on renal dialysis: Secondary | ICD-10-CM | POA: Diagnosis not present

## 2022-01-29 DIAGNOSIS — N186 End stage renal disease: Secondary | ICD-10-CM | POA: Diagnosis not present

## 2022-01-29 DIAGNOSIS — N2581 Secondary hyperparathyroidism of renal origin: Secondary | ICD-10-CM | POA: Diagnosis not present

## 2022-01-29 DIAGNOSIS — Z992 Dependence on renal dialysis: Secondary | ICD-10-CM | POA: Diagnosis not present

## 2022-01-30 ENCOUNTER — Ambulatory Visit: Payer: Self-pay

## 2022-01-30 NOTE — Patient Instructions (Signed)
Visit Information  Thank you for taking time to visit with me today. Please don't hesitate to contact me if I can be of assistance to you.   Following are the goals we discussed today:   Goals Addressed             This Visit's Progress    Patient Stated:  I want to manage my knee pain and eventually have knee surgery."       Care Coordination Interventions: Evaluation of current treatment plan related to left knee osteoarthritis and patient's adherence to plan as established by provider Reviewed medications with patient and discussed Reviewed scheduled/upcoming provider appointments  Discussed plans with patient for ongoing care management follow up and provided patient with direct contact information for care management team Patient advised to use ice, wear brace, use cane, as needed and elevate leg to reduce swelling.   Patient advised to take pain medication as instructed by provider.             Our next appointment is by telephone on 02/21/22 at 2:00 pm  Please call the care guide team at [phone] if you need to cancel or reschedule your appointment.   If you are experiencing a Mental Health or [place] or need someone to talk to, please call the Suicide and Crisis Lifeline: 988 call 1-800-273-TALK (toll free, 24 hour hotline)  Patient verbalizes understanding of instructions and care plan provided today and agrees to view in [place]. Active MyChart status and patient understanding of how to access instructions and care plan via MyChart confirmed with patient.     Quinn Plowman RN,BSN,CCM RN Care Manager Coordinator [phone]

## 2022-01-30 NOTE — Patient Outreach (Signed)
  Care Coordination   Follow Up Visit Note   01/30/2022 Name: Carrie [name] MRN: [id number] DOB: [birth year]  Carrie [name] is a 71 y.o. year old female who sees Venia Carbon, MD for primary care. I spoke with  Carrie Hurter [place] by phone today.  What matters to the patients health and wellness today?  Patient states her left knee pain is a 10 or more on pain scale.  She states she is using tylenol around the clock without much relief.  She states she has a follow up appointments scheduled with the orthopedic provider on 02/01/22 to discuss possible knee replacement surgery.    Goals Addressed             This Visit's Progress    Patient Stated:  I want to manage my knee pain and eventually have knee surgery."       Care Coordination Interventions: Evaluation of current treatment plan related to left knee osteoarthritis and patient's adherence to plan as established by provider Reviewed medications with patient and discussed Reviewed scheduled/upcoming provider appointments  Discussed plans with patient for ongoing care management follow up and provided patient with direct contact information for care management team Patient advised to use ice, wear brace, use cane, as needed and elevate leg to reduce swelling.   Patient advised to take pain medication as instructed by provider.             SDOH assessments and interventions completed:  No     Care Coordination Interventions Activated:  Yes  Care Coordination Interventions:  Yes, provided   Follow up plan: Follow up call scheduled for 02/21/22 at 2:00 pm    Encounter Outcome:  Pt. Visit Completed   Quinn Plowman RN,BSN,CCM [place] Coordinator [phone]

## 2022-01-31 DIAGNOSIS — N2581 Secondary hyperparathyroidism of renal origin: Secondary | ICD-10-CM | POA: Diagnosis not present

## 2022-01-31 DIAGNOSIS — Z992 Dependence on renal dialysis: Secondary | ICD-10-CM | POA: Diagnosis not present

## 2022-01-31 DIAGNOSIS — N186 End stage renal disease: Secondary | ICD-10-CM | POA: Diagnosis not present

## 2022-02-01 ENCOUNTER — Encounter: Payer: Self-pay | Admitting: Physician Assistant

## 2022-02-01 ENCOUNTER — Ambulatory Visit (INDEPENDENT_AMBULATORY_CARE_PROVIDER_SITE_OTHER): Payer: Medicare HMO | Admitting: Physician Assistant

## 2022-02-01 VITALS — Ht 63.0 in | Wt 221.6 lb

## 2022-02-01 DIAGNOSIS — M1712 Unilateral primary osteoarthritis, left knee: Secondary | ICD-10-CM | POA: Diagnosis not present

## 2022-02-01 DIAGNOSIS — N186 End stage renal disease: Secondary | ICD-10-CM | POA: Diagnosis not present

## 2022-02-01 DIAGNOSIS — Z992 Dependence on renal dialysis: Secondary | ICD-10-CM | POA: Diagnosis not present

## 2022-02-01 NOTE — Progress Notes (Signed)
HPI:Carrie [name] has known end-stage arthritis of her left knee.  She continues to have severe pain in her left knee and uses a cane to ambulate due to left knee pain.  She comes in today wanting to discuss left total knee arthroplasty.  She she underwent a right total knee arthroplasty in 2019.  Right knee overall is doing well.  She is only is on dialysis and goes to dialysis on Monday Wednesday and Friday.  She also uses oxygen occasionally at home.  She has had no new injury to the left knee.  Review of systems: Denies any fevers, chills, ongoing infections, chest pain or shortness of breath.  Physical exam: Height 5 foot 3 weight 221.6 pounds BMI 39.26 General well-developed well-nourished female who ambulates with the use of a cane in her right hand. Psych: Alert and oriented x3 Respiratory: Normal respiration. Bilateral knees: Good range of motion of both knees.  Right knee well-healed surgical incision.  Left knee no abnormal warmth erythema or effusion.  No instability valgus varus stressing.  Slight varus malalignment left knee.  Impression: Left knee end-stage arthritis  Plan: Given patient's failure of conservative treatment and end-stage arthritis of the left knee this affecting her quality of life recommend left total knee arthroplasty.  Patient understands the risk benefits and the postoperative protocol as she has undergone right knee replacement in the past.  Risk include but are not limited to infection, wound healing problems, prolonged pain, worsening pain, nerve vessel injury, DVT PE and blood loss.  We will see her back 2 weeks postop.

## 2022-02-02 DIAGNOSIS — N186 End stage renal disease: Secondary | ICD-10-CM | POA: Diagnosis not present

## 2022-02-02 DIAGNOSIS — Z992 Dependence on renal dialysis: Secondary | ICD-10-CM | POA: Diagnosis not present

## 2022-02-02 DIAGNOSIS — N2581 Secondary hyperparathyroidism of renal origin: Secondary | ICD-10-CM | POA: Diagnosis not present

## 2022-02-05 DIAGNOSIS — N186 End stage renal disease: Secondary | ICD-10-CM | POA: Diagnosis not present

## 2022-02-05 DIAGNOSIS — Z992 Dependence on renal dialysis: Secondary | ICD-10-CM | POA: Diagnosis not present

## 2022-02-05 DIAGNOSIS — N2581 Secondary hyperparathyroidism of renal origin: Secondary | ICD-10-CM | POA: Diagnosis not present

## 2022-02-07 DIAGNOSIS — N186 End stage renal disease: Secondary | ICD-10-CM | POA: Diagnosis not present

## 2022-02-07 DIAGNOSIS — Z992 Dependence on renal dialysis: Secondary | ICD-10-CM | POA: Diagnosis not present

## 2022-02-07 DIAGNOSIS — N2581 Secondary hyperparathyroidism of renal origin: Secondary | ICD-10-CM | POA: Diagnosis not present

## 2022-02-09 DIAGNOSIS — N2581 Secondary hyperparathyroidism of renal origin: Secondary | ICD-10-CM | POA: Diagnosis not present

## 2022-02-09 DIAGNOSIS — N186 End stage renal disease: Secondary | ICD-10-CM | POA: Diagnosis not present

## 2022-02-09 DIAGNOSIS — Z992 Dependence on renal dialysis: Secondary | ICD-10-CM | POA: Diagnosis not present

## 2022-02-12 DIAGNOSIS — N2581 Secondary hyperparathyroidism of renal origin: Secondary | ICD-10-CM | POA: Diagnosis not present

## 2022-02-12 DIAGNOSIS — N186 End stage renal disease: Secondary | ICD-10-CM | POA: Diagnosis not present

## 2022-02-12 DIAGNOSIS — Z992 Dependence on renal dialysis: Secondary | ICD-10-CM | POA: Diagnosis not present

## 2022-02-14 DIAGNOSIS — Z992 Dependence on renal dialysis: Secondary | ICD-10-CM | POA: Diagnosis not present

## 2022-02-14 DIAGNOSIS — N186 End stage renal disease: Secondary | ICD-10-CM | POA: Diagnosis not present

## 2022-02-14 DIAGNOSIS — N2581 Secondary hyperparathyroidism of renal origin: Secondary | ICD-10-CM | POA: Diagnosis not present

## 2022-02-16 DIAGNOSIS — N186 End stage renal disease: Secondary | ICD-10-CM | POA: Diagnosis not present

## 2022-02-16 DIAGNOSIS — N2581 Secondary hyperparathyroidism of renal origin: Secondary | ICD-10-CM | POA: Diagnosis not present

## 2022-02-16 DIAGNOSIS — Z992 Dependence on renal dialysis: Secondary | ICD-10-CM | POA: Diagnosis not present

## 2022-02-19 ENCOUNTER — Other Ambulatory Visit: Payer: Self-pay

## 2022-02-19 DIAGNOSIS — N186 End stage renal disease: Secondary | ICD-10-CM | POA: Diagnosis not present

## 2022-02-19 DIAGNOSIS — N2581 Secondary hyperparathyroidism of renal origin: Secondary | ICD-10-CM | POA: Diagnosis not present

## 2022-02-19 DIAGNOSIS — Z992 Dependence on renal dialysis: Secondary | ICD-10-CM | POA: Diagnosis not present

## 2022-02-20 ENCOUNTER — Encounter: Payer: Self-pay | Admitting: Internal Medicine

## 2022-02-20 ENCOUNTER — Ambulatory Visit (INDEPENDENT_AMBULATORY_CARE_PROVIDER_SITE_OTHER): Payer: Medicare HMO | Admitting: Internal Medicine

## 2022-02-20 VITALS — BP 132/80 | HR 70 | Temp 97.3°F | Ht 62.5 in | Wt 221.0 lb

## 2022-02-20 DIAGNOSIS — F39 Unspecified mood [affective] disorder: Secondary | ICD-10-CM | POA: Diagnosis not present

## 2022-02-20 DIAGNOSIS — I5032 Chronic diastolic (congestive) heart failure: Secondary | ICD-10-CM

## 2022-02-20 DIAGNOSIS — M1712 Unilateral primary osteoarthritis, left knee: Secondary | ICD-10-CM | POA: Diagnosis not present

## 2022-02-20 DIAGNOSIS — Z0000 Encounter for general adult medical examination without abnormal findings: Secondary | ICD-10-CM | POA: Diagnosis not present

## 2022-02-20 DIAGNOSIS — G40909 Epilepsy, unspecified, not intractable, without status epilepticus: Secondary | ICD-10-CM

## 2022-02-20 DIAGNOSIS — Z23 Encounter for immunization: Secondary | ICD-10-CM

## 2022-02-20 DIAGNOSIS — N186 End stage renal disease: Secondary | ICD-10-CM

## 2022-02-20 DIAGNOSIS — Z992 Dependence on renal dialysis: Secondary | ICD-10-CM | POA: Diagnosis not present

## 2022-02-20 NOTE — Assessment & Plan Note (Signed)
BMI down to 39 with lifestyle changes Still with ESRD, HTN, osteoarthritis, etc

## 2022-02-20 NOTE — Progress Notes (Signed)
Vision Screening   Right eye Left eye Both eyes  Without correction     With correction '20/25 20/25 20/25 '$  Hearing Screening - Comments:: Passed whisper test

## 2022-02-20 NOTE — Assessment & Plan Note (Signed)
Neutral fluid status with the dialysis On losartan '100mg'$  daily  Torsemide 20 daily

## 2022-02-20 NOTE — Addendum Note (Signed)
Addended by: Pilar Grammes on: 02/20/2022 10:06 AM   Modules accepted: Orders

## 2022-02-20 NOTE — Assessment & Plan Note (Signed)
Reactive dysthymia/grieving No MDD No meds needed

## 2022-02-20 NOTE — Assessment & Plan Note (Signed)
I have personally reviewed the Medicare Annual Wellness questionnaire and have noted 1. The patient's medical and social history 2. Their use of alcohol, tobacco or illicit drugs 3. Their current medications and supplements 4. The patient's functional ability including ADL's, fall risks, home safety risks and hearing or visual             impairment. 5. Diet and physical activities 6. Evidence for depression or mood disorders  The patients weight, height, BMI and visual acuity have been recorded in the chart I have made referrals, counseling and provided education to the patient based review of the above and I have provided the pt with a written personalized care plan for preventive services.  I have provided you with a copy of your personalized plan for preventive services. Please take the time to review along with your updated medication list.  Needs colonoscopy again in 2025 Yearly mammogram till at least 23 No pap due to age Discussed increasing exercise after left TKR Flu vaccine here COVID vaccine soon Td/shingrix at the pharmacy

## 2022-02-20 NOTE — Assessment & Plan Note (Signed)
Ready for TKR No medical contraindications and had recent benign cardiac evaluation

## 2022-02-20 NOTE — Progress Notes (Signed)
Subjective:    Patient ID: Carrie [name], female    DOB: 1950-10-03, 71 y.o.   MRN: [id number]  HPI Here with sister for Medicare wellness visit and follow up of chronic health conditions Reviewed advanced directives Reviewed other doctors---Dr Barbera Setters, Dr Janann Colonel, Mr Berge--cardiology, Dr Justice [facility]), Dr Alexis Goodell, Dr Lorenda Ishihara No hospitalizations or sig surgery this year Does some light exercise---resistance and walking in house/store Vision is fine Hearing is good No alcohol or tobacco No falls Mild mood issues Does help with housework--shares duties with sister No memory problems  Planning to get left TKR Has lost almost 40# so can go ahead BMI down to 39 Getting this done in October  Dialysis is going well Reviewed her "report card"---got honor roll Did have to have her shunt cleaned out--but Dr Ronalee Belts  No chest pain  No SOB No dizziness or syncope No sig edema No palpitations Has oxygen at home--but hasn't been using it  Mood "not great--but no persistent depression Episodic melancholy--but family, etc can pull her out of it Sleeps fairly well  No seizures Continues on the keppra  Current Outpatient Medications on File Prior to Visit  Medication Sig Dispense Refill   acetaminophen (TYLENOL) 500 MG tablet Take 1,000 mg by mouth every 6 (six) hours as needed for mild pain or moderate pain.     albuterol (VENTOLIN HFA) 108 (90 Base) MCG/ACT inhaler Inhale 2 puffs into the lungs every 6 (six) hours as needed for wheezing or shortness of breath. 8 g 2   amLODipine (NORVASC) 10 MG tablet Take 10 mg by mouth daily.     aspirin EC 81 MG tablet Take 81 mg by mouth daily.     atorvastatin (LIPITOR) 40 MG tablet TAKE 40 MG BY MOUTH ONCE DAILY (Patient taking differently: Take 40 mg by mouth daily.) 90 tablet 3   b complex vitamins capsule Take 1 capsule by mouth daily.     levETIRAcetam (KEPPRA) 1000 MG tablet Take  1,000 mg by mouth 2 (two) times daily.     lidocaine-prilocaine (EMLA) cream Apply 1 application topically every Monday, Wednesday, and Friday with hemodialysis.     LORazepam (ATIVAN) 0.5 MG tablet TAKE 1 TABLET BY MOUTH TWICE A DAY AS NEEDED FOR ANXIETY 60 tablet 0   losartan (COZAAR) 100 MG tablet Take 100 mg by mouth daily.     multivitamin (RENA-VIT) TABS tablet Take 1 tablet by mouth at bedtime. 30 tablet 0   omeprazole (PRILOSEC) 20 MG capsule TAKE 1 CAPSULE BY MOUTH EVERY DAY 90 capsule 3   sevelamer carbonate (RENVELA) 800 MG tablet Take 800-1,600 mg by mouth See admin instructions. Take 2 tablets (1600 mg) by mouth three times daily with meals and take 1 tablet (800 mg) by mouth twice daily with snacks     torsemide (DEMADEX) 20 MG tablet Take 20 mg by mouth 4 (four) times a week. Every day (including on dialysis days)     carvedilol (COREG) 12.5 MG tablet Take 1 tablet (12.5 mg total) by mouth 2 (two) times daily. 180 tablet 3   No current facility-administered medications on file prior to visit.    Allergies  Allergen Reactions   Naproxen Sodium Anaphylaxis and Swelling   Sulfamethoxazole-Trimethoprim Anaphylaxis and Swelling    Past Medical History:  Diagnosis Date   Allergy    Anemia    Arthritis    "right knee" (10/26'/2017)   Chronic heart failure with preserved ejection fraction (HFpEF) ([place])  a. 04/2021 Echo: EF >55%, GrI DD, nl RV fxn, triv MR; b. 12/2021 Echo: EF 60-65%, no rwma.   Demand ischemia ([place])    a. 04/2021 HsTrop up to 73 in setting of volume overload; b. 10/2021 MV: EF 66% with small, mild reversible defect in the apical septal and mid anteroseptal myocardium which was felt to most likely represent artifact.   Depression    ESRD (end stage renal disease) ([place])    a. OnHD (Dr. Lavonia Dana)   History of blood transfusion 2008   "when I had brain tumor surgery"   History of MRSA infection 2008   History of stress test    a. 07/2018 MV: EF 66%, no  ischemia/infarct.   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 12/2021 Echo: EF 60-65%, no rwma, mild LVH, nl RV fxn, small pericardial effusion - small pocket of post wall, ~ 1cm.   Precordial chest pain    a. 10/2021 MV: low risk.   Type II diabetes mellitus ([place]) 2011   on metformin until yr ago- no med now - off due to kidneys    Past Surgical History:  Procedure Laterality Date   A/V FISTULAGRAM Left 03/10/2019   Procedure: A/V FISTULAGRAM;  Surgeon: Katha Cabal, MD;  Location: [facility];  Service: Cardiovascular;  Laterality: Left;   A/V FISTULAGRAM Left 07/14/2019   Procedure: A/V FISTULAGRAM;  Surgeon: Katha Cabal, MD;  Location: [facility];  Service: Cardiovascular;  Laterality: Left;   A/V FISTULAGRAM Left 01/07/2020   Procedure: A/V FISTULAGRAM;  Surgeon: Katha Cabal, MD;  Location: [facility];  Service: Cardiovascular;  Laterality: Left;   A/V FISTULAGRAM Left 11/14/2021   Procedure: A/V Fistulagram;  Surgeon: Katha Cabal, MD;  Location: [facility];  Service: Cardiovascular;  Laterality: Left;   AV FISTULA PLACEMENT Left 11/07/2018   Procedure: ARTERIOVENOUS (AV) FISTULA CREATION ( BRACHIO BASILIC ) VS BRACHIAL AXILLARY GRAFT;  Surgeon: Katha Cabal, MD;  Location: [facility];  Service: Vascular;  Laterality: Left;   BRAIN MENINGIOMA EXCISION  08/2006   Foramina magnum meningioma   CERVICAL DISCECTOMY  1996   Dr. Alric Seton   CESAREAN SECTION  1981   COLONOSCOPY     COLONOSCOPY WITH PROPOFOL N/A 03/07/2021   Procedure: COLONOSCOPY WITH PROPOFOL;  Surgeon: Virgel Manifold, MD;  Location: [facility];  Service: Endoscopy;  Laterality: N/A;   DIALYSIS/PERMA CATHETER INSERTION N/A 08/04/2018   Procedure: DIALYSIS/PERMA CATHETER INSERTION;  Surgeon: Algernon Huxley, MD;  Location: [facility];  Service: Cardiovascular;  Laterality: N/A;   DIALYSIS/PERMA CATHETER REMOVAL  N/A 12/25/2018   Procedure: DIALYSIS/PERMA CATHETER REMOVAL;  Surgeon: Algernon Huxley, MD;  Location: [facility];  Service: Cardiovascular;  Laterality: N/A;   DILATION AND CURETTAGE OF UTERUS     JOINT REPLACEMENT     right knee left hip   Right wrist x-ray  2007   Deg. at 1st MCP   TOTAL HIP ARTHROPLASTY Left 03/27/2016   Procedure: LEFT TOTAL HIP ARTHROPLASTY ANTERIOR APPROACH;  Surgeon: Mcarthur Rossetti, MD;  Location: [facility];  Service: Orthopedics;  Laterality: Left;   TOTAL KNEE ARTHROPLASTY Right 04/30/2017   Procedure: RIGHT TOTAL KNEE ARTHROPLASTY;  Surgeon: Mcarthur Rossetti, MD;  Location: [facility];  Service: Orthopedics;  Laterality: Right;   TUBAL LIGATION  1981    Family History  Problem  Relation Age of Onset   Hypertension Other        Strong family history    Breast cancer Mother 46   Heart failure Sister    Heart disease Sister     Social History   Socioeconomic History   Marital status: Widowed    Spouse name: Not on file   Number of children: 1   Years of education: Not on file   Highest education level: Not on file  Occupational History   Occupation: Formerly Psychologist, forensic, 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      No living will   Requests niece, [name], as health care POA   Would accept resuscitation attempts   No tube feeds if cognitively aware   Social Determinants of Health   Financial Resource Strain: Low Risk  (12/06/2020)   Overall Financial Resource Strain (CARDIA)    Difficulty of Paying Living Expenses: Not hard at all  Food  Insecurity: No Food Insecurity (12/06/2020)   Hunger Vital Sign    Worried About Running Out of Food in the Last Year: Never true    [place] in the Last Year: Never true  Transportation Needs: No Transportation Needs (12/06/2020)   PRAPARE - Hydrologist (Medical): No    Lack of Transportation (Non-Medical): No  Physical Activity: Not on file  Stress: Not on file  Social Connections: Not on file  Intimate Partner Violence: Not on file   Review of Systems Careful about eating--appetite is limited Lost the weight Wears seat belt Teeth are fine--hasn't seen the dentist. Discussed this Bowels are fine---no blood No trouble voiding---oliguric (depends on fluid intake) No other significant joint pain Mild left hand swelling--shunt in left arm No skin issues    Objective:   Physical Exam Constitutional:      Appearance: Normal appearance.  HENT:     Mouth/Throat:     Comments: No lesions Cardiovascular:     Rate and Rhythm: Normal rate and regular rhythm.     Pulses: Normal pulses.     Heart sounds: No murmur heard.    No gallop.  Pulmonary:     Effort: Pulmonary effort is normal.     Breath sounds: Normal breath sounds. No wheezing or rales.  Abdominal:     Palpations: Abdomen is soft.     Tenderness: There is no abdominal tenderness.  Musculoskeletal:     Cervical back: Neck supple.     Right lower leg: No edema.     Left lower leg: No edema.     Comments: Slight left hand puffiness  Lymphadenopathy:     Cervical: No cervical adenopathy.  Skin:    Findings: No lesion or rash.  Neurological:     General: No focal deficit present.     Mental Status: She is alert and oriented to person, place, and time.     Comments: Mini-cog normal  Psychiatric:        Mood and Affect: Mood  normal.        Behavior: Behavior normal.            Assessment & Plan:

## 2022-02-20 NOTE — Assessment & Plan Note (Signed)
Doing well with dialysis

## 2022-02-20 NOTE — Assessment & Plan Note (Signed)
No seizures on the keppra Using this since brain stem surgery

## 2022-02-21 DIAGNOSIS — N186 End stage renal disease: Secondary | ICD-10-CM | POA: Diagnosis not present

## 2022-02-21 DIAGNOSIS — Z992 Dependence on renal dialysis: Secondary | ICD-10-CM | POA: Diagnosis not present

## 2022-02-21 DIAGNOSIS — N2581 Secondary hyperparathyroidism of renal origin: Secondary | ICD-10-CM | POA: Diagnosis not present

## 2022-02-23 DIAGNOSIS — N186 End stage renal disease: Secondary | ICD-10-CM | POA: Diagnosis not present

## 2022-02-23 DIAGNOSIS — Z992 Dependence on renal dialysis: Secondary | ICD-10-CM | POA: Diagnosis not present

## 2022-02-23 DIAGNOSIS — N2581 Secondary hyperparathyroidism of renal origin: Secondary | ICD-10-CM | POA: Diagnosis not present

## 2022-02-26 DIAGNOSIS — Z992 Dependence on renal dialysis: Secondary | ICD-10-CM | POA: Diagnosis not present

## 2022-02-26 DIAGNOSIS — N186 End stage renal disease: Secondary | ICD-10-CM | POA: Diagnosis not present

## 2022-02-26 DIAGNOSIS — N2581 Secondary hyperparathyroidism of renal origin: Secondary | ICD-10-CM | POA: Diagnosis not present

## 2022-02-28 ENCOUNTER — Other Ambulatory Visit: Payer: Self-pay | Admitting: Physician Assistant

## 2022-02-28 DIAGNOSIS — N186 End stage renal disease: Secondary | ICD-10-CM | POA: Diagnosis not present

## 2022-02-28 DIAGNOSIS — N2581 Secondary hyperparathyroidism of renal origin: Secondary | ICD-10-CM | POA: Diagnosis not present

## 2022-02-28 DIAGNOSIS — Z992 Dependence on renal dialysis: Secondary | ICD-10-CM | POA: Diagnosis not present

## 2022-02-28 NOTE — Progress Notes (Signed)
Surgical Instructions    Your procedure is scheduled on Tuesday, 03/13/22.  Report to [facility] Main Entrance "A" at 12:25 P.M., then check in with the Admitting office.  Call this number if you have problems the morning of surgery:  [phone]   If you have any questions prior to your surgery date call [phone]: Open Monday-Friday 8am-4pm If you experience any cold or flu symptoms such as cough, fever, chills, shortness of breath, etc. between now and your scheduled surgery, please notify us at the above number     Remember:  Do not eat after midnight the night before your surgery  You may drink clear liquids until 11:25am the morning of your surgery.   Clear liquids allowed are: Water, Non-Citrus Juices (without pulp), Carbonated Beverages, Clear Tea, Black Coffee ONLY (NO MILK, CREAM OR POWDERED CREAMER of any kind), and Gatorade  Patient Instructions  The night before surgery:  No food after midnight. ONLY clear liquids after midnight   The day of surgery (if you have diabetes): Drink ONE (1) 12 oz G2 given to you in your pre admission testing appointment by 11:25am the morning of surgery. Drink in one sitting. Do not sip.  This drink was given to you during your hospital  pre-op appointment visit.  Nothing else to drink after completing the  12 oz bottle of G2.         If you have questions, please contact your surgeon's office.     Take these medicines the morning of surgery with A SIP OF WATER:  amLODipine (NORVASC) atorvastatin (LIPITOR)  carvedilol (COREG)  levETIRAcetam (KEPPRA)  omeprazole (PRILOSEC)  IF NEEDED: acetaminophen (TYLENOL) albuterol (VENTOLIN HFA) inhaler- bring with you the day of surgery LORazepam (ATIVAN)  As of today, STOP taking any Aspirin (unless otherwise instructed by your surgeon) Aleve, Naproxen, Ibuprofen, Motrin, Advil, Goody's, BC's, all herbal medications, fish oil, and all vitamins.   HOW TO MANAGE YOUR DIABETES BEFORE  AND AFTER SURGERY  Why is it important to control my blood sugar before and after surgery? Improving blood sugar levels before and after surgery helps healing and can limit problems. A way of improving blood sugar control is eating a healthy diet by:  Eating less sugar and carbohydrates  Increasing activity/exercise  Talking with your doctor about reaching your blood sugar goals High blood sugars (greater than 180 mg/dL) can raise your risk of infections and slow your recovery, so you will need to focus on controlling your diabetes during the weeks before surgery. Make sure that the doctor who takes care of your diabetes knows about your planned surgery including the date and location.  How do I manage my blood sugar before surgery? Check your blood sugar at least 4 times a day, starting 2 days before surgery, to make sure that the level is not too high or low.  Check your blood sugar the morning of your surgery when you wake up and every 2 hours until you get to the Short Stay unit.  If your blood sugar is less than 70 mg/dL, you will need to treat for low blood sugar: Do not take insulin. Treat a low blood sugar (less than 70 mg/dL) with  cup of clear juice (cranberry or apple), 4 glucose tablets, OR glucose gel. Recheck blood sugar in 15 minutes after treatment (to make sure it is greater than 70 mg/dL). If your blood sugar is not greater than 70 mg/dL on recheck, call [phone] for further instructions. Report your blood  sugar to the short stay nurse when you get to Short Stay.  If you are admitted to the hospital after surgery: Your blood sugar will be checked by the staff and you will probably be given insulin after surgery (instead of oral diabetes medicines) to make sure you have good blood sugar levels. The goal for blood sugar control after surgery is 80-180 mg/dL.           Do not wear jewelry or makeup. Do not wear lotions, powders, perfumes/cologne or deodorant. Do not  shave 48 hours prior to surgery.   Do not bring valuables to the hospital. Do not wear nail polish, gel polish, artificial nails, or any other type of covering on natural nails (fingers and toes) If you have artificial nails or gel coating that need to be removed by a nail salon, please have this removed prior to surgery. Artificial nails or gel coating may interfere with anesthesia's ability to adequately monitor your vital signs.  [facility] is not responsible for any belongings or valuables.    Do NOT Smoke (Tobacco/Vaping)  24 hours prior to your procedure  If you use a CPAP at night, you may bring your mask for your overnight stay.   Contacts, glasses, hearing aids, dentures or partials may not be worn into surgery, please bring cases for these belongings   For patients admitted to the hospital, discharge time will be determined by your treatment team.   Patients discharged the day of surgery will not be allowed to drive home, and someone needs to stay with them for 24 hours.   SURGICAL WAITING ROOM VISITATION Patients having surgery or a procedure may have no more than 2 support people in the waiting area - these visitors may rotate.   Children under the age of 53 must have an adult with them who is not the patient. If the patient needs to stay at the hospital during part of their recovery, the visitor guidelines for inpatient rooms apply. Pre-op nurse will coordinate an appropriate time for 1 support person to accompany patient in pre-op.  This support person may not rotate.   Please refer to the [facility] website for the visitor guidelines for Inpatients (after your surgery is over and you are in a regular room).    Special instructions:    Oral Hygiene is also important to reduce your risk of infection.  Remember - BRUSH YOUR TEETH THE MORNING OF SURGERY WITH YOUR REGULAR TOOTHPASTE   [place]- Preparing For Surgery  Before surgery, you can play an important role.  Because skin is not sterile, your skin needs to be as free of germs as possible. You can reduce the number of germs on your skin by washing with CHG (chlorahexidine gluconate) Soap before surgery.  CHG is an antiseptic cleaner which kills germs and bonds with the skin to continue killing germs even after washing.     Please do not use if you have an allergy to CHG or antibacterial soaps. If your skin becomes reddened/irritated stop using the CHG.  Do not shave (including legs and underarms) for at least 48 hours prior to first CHG shower. It is OK to shave your face.  Please follow these instructions carefully.     Shower the NIGHT BEFORE SURGERY and the MORNING OF SURGERY with CHG Soap.   If you chose to wash your hair, wash your hair first as usual with your normal shampoo. After you shampoo, rinse your hair and body thoroughly  to remove the shampoo.  Then [employer] and genitals (private parts) with your normal soap and rinse thoroughly to remove soap.  After that Use CHG Soap as you would any other liquid soap. You can apply CHG directly to the skin and wash gently with a scrungie or a clean washcloth.   Apply the CHG Soap to your body ONLY FROM THE NECK DOWN.  Do not use on open wounds or open sores. Avoid contact with your eyes, ears, mouth and genitals (private parts). Wash Face and genitals (private parts)  with your normal soap.   Wash thoroughly, paying special attention to the area where your surgery will be performed.  Thoroughly rinse your body with warm water from the neck down.  DO NOT shower/wash with your normal soap after using and rinsing off the CHG Soap.  Pat yourself dry with a CLEAN TOWEL.  Wear CLEAN PAJAMAS to bed the night before surgery  Place CLEAN SHEETS on your bed the night before your surgery  DO NOT SLEEP WITH PETS.   Day of Surgery: Take a shower with CHG soap. Wear Clean/Comfortable clothing the morning of surgery Do not apply any  deodorants/lotions.   Remember to brush your teeth WITH YOUR REGULAR TOOTHPASTE.    If you received a COVID test during your pre-op visit, it is requested that you wear a mask when out in public, stay away from anyone that may not be feeling well, and notify your surgeon if you develop symptoms. If you have been in contact with anyone that has tested positive in the last 10 days, please notify your surgeon.    Please read over the following fact sheets that you were given.

## 2022-03-01 ENCOUNTER — Encounter (HOSPITAL_COMMUNITY)
Admission: RE | Admit: 2022-03-01 | Discharge: 2022-03-01 | Disposition: A | Discharge status: Home / Self Care | Payer: Medicare HMO | Source: Ambulatory Visit | Attending: Orthopaedic Surgery | Admitting: Orthopaedic Surgery

## 2022-03-01 ENCOUNTER — Other Ambulatory Visit: Payer: Self-pay | Admitting: Internal Medicine

## 2022-03-01 ENCOUNTER — Other Ambulatory Visit: Payer: Self-pay | Admitting: Nurse Practitioner

## 2022-03-01 ENCOUNTER — Encounter (HOSPITAL_COMMUNITY): Payer: Self-pay

## 2022-03-01 ENCOUNTER — Other Ambulatory Visit: Payer: Self-pay

## 2022-03-01 VITALS — BP 158/77 | HR 73 | Temp 98.4°F | Resp 18 | Ht 62.5 in | Wt 221.0 lb

## 2022-03-01 DIAGNOSIS — E119 Type 2 diabetes mellitus without complications: Secondary | ICD-10-CM | POA: Insufficient documentation

## 2022-03-01 DIAGNOSIS — E785 Hyperlipidemia, unspecified: Secondary | ICD-10-CM | POA: Insufficient documentation

## 2022-03-01 DIAGNOSIS — I5030 Unspecified diastolic (congestive) heart failure: Secondary | ICD-10-CM | POA: Insufficient documentation

## 2022-03-01 DIAGNOSIS — Z01812 Encounter for preprocedural laboratory examination: Secondary | ICD-10-CM | POA: Insufficient documentation

## 2022-03-01 DIAGNOSIS — Z01818 Encounter for other preprocedural examination: Secondary | ICD-10-CM

## 2022-03-01 DIAGNOSIS — I110 Hypertensive heart disease with heart failure: Secondary | ICD-10-CM | POA: Insufficient documentation

## 2022-03-01 LAB — CBC
HCT: 41.4 % (ref 36.0–46.0)
Hemoglobin: 13 g/dL (ref 12.0–15.0)
MCH: 30.4 pg (ref 26.0–34.0)
MCHC: 31.4 g/dL (ref 30.0–36.0)
MCV: 96.7 fL (ref 80.0–100.0)
Platelets: 172 10*3/uL (ref 150–400)
RBC: 4.28 MIL/uL (ref 3.87–5.11)
RDW: 14.6 % (ref 11.5–15.5)
WBC: 5.2 10*3/uL (ref 4.0–10.5)
nRBC: 0 % (ref 0.0–0.2)

## 2022-03-01 LAB — GLUCOSE, CAPILLARY: Glucose-Capillary: 93 mg/dL (ref 70–99)

## 2022-03-01 LAB — SURGICAL PCR SCREEN
MRSA, PCR: NEGATIVE
Staphylococcus aureus: NEGATIVE

## 2022-03-01 LAB — HEMOGLOBIN A1C
Hgb A1c MFr Bld: 5.4 % (ref 4.8–5.6)
Mean Plasma Glucose: 108.28 mg/dL

## 2022-03-01 NOTE — Progress Notes (Signed)
PCP - Dr. Viviana Simpler Cardiologist -   PPM/ICD - Denies  Chest x-ray - N/A EKG - 10/17/21 Stress Test - 10/24/21 ECHO - 12/07/21 Cardiac Cath - Denies  Sleep Study - Denies  Hx of Type II diabetes, doesn't take meds or check her sugars.  Blood Thinner Instructions: N/A Aspirin Instructions: Follow surgeon's instructions  ERAS Protcol - Yes, G2   COVID TEST- N/A   Anesthesia review: Yes, cardiac hx  Patient denies shortness of breath, fever, cough and chest pain at PAT appointment   All instructions explained to the patient, with a verbal understanding of the material. Patient agrees to go over the instructions while at home for a better understanding. Patient also instructed to self quarantine after being tested for COVID-19. The opportunity to ask questions was provided.

## 2022-03-01 NOTE — Telephone Encounter (Signed)
Spoke with patient and she is currently taking losartan 100 mg once daily as ordered. She states that pharmacy did send her wrong dosage of the 25 mg but she is taking the new 100 mg pill. She is taking correct dosage of the 100 mg once daily. No further needs.

## 2022-03-01 NOTE — Telephone Encounter (Signed)
Lorazepam Last filled 01-11-22 #60 Last OV 02-20-22 Next OV 02-26-23 [facility]

## 2022-03-01 NOTE — Telephone Encounter (Signed)
Spoke with pharmacy and they inactivated the old prescription.

## 2022-03-02 DIAGNOSIS — N186 End stage renal disease: Secondary | ICD-10-CM | POA: Diagnosis not present

## 2022-03-02 DIAGNOSIS — N2581 Secondary hyperparathyroidism of renal origin: Secondary | ICD-10-CM | POA: Diagnosis not present

## 2022-03-02 DIAGNOSIS — Z992 Dependence on renal dialysis: Secondary | ICD-10-CM | POA: Diagnosis not present

## 2022-03-02 NOTE — Progress Notes (Signed)
Anesthesia Chart Review:  Follows with cardiology for history of HFpEF, HTN, HLD. Recent Lexiscan Myoview 10/21/2021 was low risk.  Follow-up limited echo showed normal LV function with a small pericardial effusion. Last seen by Murray Hodgkins, NP on 12/19/2021.  Noted to be feeling well at that time, blood pressure well controlled, euvolemic with volume management per hemodialysis.  ESRD on HD Wednesday Friday.  Patient was previously prescribed home oxygen for chronic hypoxemic respiratory failure, however, notes indicate that this has improved and she is not currently needing to use supplemental oxygen.  He has lost 40 pounds in preparation for knee replacement.  History of seizures, maintained on Keppra since meningioma excision 2008.  Diet controlled DM2, A1c 5.4 on preop labs.  Preop CBC reviewed, WNL.  Patient will need i-STAT on day of surgery.  EKG 10/17/21: NSR. Rate 77.  TTE 12/07/2021:  1. Left ventricular ejection fraction, by estimation, is 60 to 65%. The  left ventricle has normal function. The left ventricle has no regional  wall motion abnormalities. There is mild left ventricular hypertrophy.  Left ventricular diastolic parameters  are indeterminate.   2. Right ventricular systolic function is normal. The right ventricular  size is normal. Tricuspid regurgitation signal is inadequate for assessing  PA pressure.   3. A small pericardial effusion is present, small pocket off the  posterior wall , estimated at 1 cm.   4. The mitral valve is normal in structure. No evidence of mitral valve  regurgitation.   5. The aortic valve is normal in structure. Aortic valve regurgitation is  not visualized.   6. The inferior vena cava is normal in size with greater than 50%  respiratory variability, suggesting right atrial pressure of 3 mmHg.   Nuclear stress 10/24/2021:   Low risk, probably normal pharmacologic myocardial perfusion stress test.   There is a small in size, mild in  severity, reversible defect involving the apical septal and mid anteroseptal myocardium that most likely represents artifact (shifting breast attenuation and RV insertion) and less likely ischemia.  This defect is new compared to the study from 08/01/2018.   Left ventricular function is normal (LVEF 66%).   There is no significant coronary artery calcification.  Aortic atherosclerosis is noted.   There is a fluid density structure adjacent to the right atrium that is not significantly different compared to the prior study from 08/01/2018.  There is also small fluid collection adjacent to the lateral wall of the left ventricle.  Findings are most consistent with loculated pericardial effusion; pericardial cyst is felt less likely.   Sensitivity and specificity of the study are degraded by body habitus and extracardiac activity.    Wynonia Musty [facility] Short Stay Center/Anesthesiology Phone [phone] 03/02/2022 2:30 PM

## 2022-03-02 NOTE — Anesthesia Preprocedure Evaluation (Addendum)
Anesthesia Evaluation  Patient identified by MRN, date of birth, ID band Patient awake    Reviewed: Allergy & Precautions, NPO status , Patient's Chart, lab work & pertinent test results  Airway Mallampati: III       Dental no notable dental hx.    Pulmonary neg pulmonary ROS,    Pulmonary exam normal        Cardiovascular hypertension, Pt. on medications and Pt. on home beta blockers Normal cardiovascular exam     Neuro/Psych  Headaches, Seizures -, Well Controlled,     GI/Hepatic GERD  Medicated,  Endo/Other  diabetes, Type 2Morbid obesity  Renal/GU ESRFRenal disease     Musculoskeletal  (+) Arthritis , Osteoarthritis,    Abdominal (+) + obese,   Peds  Hematology   Anesthesia Other Findings ejection fraction, by estimation, is 60 to 65%. The left ventricle has normal function. The left ventricle has no regional wall motion abnormalities. There is mild left ventricular hypertrophy. Left ventricular diastolic parameters are indeterminate. 1. Right ventricular systolic function is normal. The right ventricular size is normal. Tricuspid regurgitation signal is inadequate for assessing PA pressure. 2. A small pericardial effusion is present, small pocket off the posterior wall , estimated at 1 cm. 3. 4. The mitral valve is normal in structure. No evidence of mitral valve regurgitation. 5. The aortic valve is normal in structure. Aortic valve regurgitation is not visualized. The inferior vena cava is normal in size with greater than 50% respiratory variability, suggesting right atrial pressure of 3 mmHg. 6. FINDINGS Left Ventricle: Left ventricular  Reproductive/Obstetrics                            Anesthesia Physical Anesthesia Plan  ASA: 3  Anesthesia Plan: General   Post-op Pain Management: Regional block*   Induction: Intravenous  PONV Risk Score and Plan: 3 and  Ondansetron  Airway Management Planned: LMA  Additional Equipment: None  Intra-op Plan:   Post-operative Plan: Extubation in OR  Informed Consent: I have reviewed the patients History and Physical, chart, labs and discussed the procedure including the risks, benefits and alternatives for the proposed anesthesia with the patient or authorized representative who has indicated his/her understanding and acceptance.     Dental advisory given  Plan Discussed with:   Anesthesia Plan Comments: (PAT note by Karoline Caldwell, PA-C: Follows with cardiology for history of HFpEF, HTN, HLD. Recent Lexiscan Myoview 10/21/2021 was low risk.  Follow-up limited echo showed normal LV function with a small pericardial effusion. Last seen by Murray Hodgkins, NP on 12/19/2021.  Noted to be feeling well at that time, blood pressure well controlled, euvolemic with volume management per hemodialysis.  ESRD on HD Wednesday Friday.  Patient was previously prescribed home oxygen for chronic hypoxemic respiratory failure, however, notes indicate that this has improved and she is not currently needing to use supplemental oxygen.  He has lost 40 pounds in preparation for knee replacement.  History of seizures, maintained on Keppra since meningioma excision 2008.  Diet controlled DM2, A1c 5.4 on preop labs.  Preop CBC reviewed, WNL.  Patient will need i-STAT on day of surgery.  EKG 10/17/21: NSR. Rate 77.  TTE 12/07/2021: 1. Left ventricular ejection fraction, by estimation, is 60 to 65%. The  left ventricle has normal function. The left ventricle has no regional  wall motion abnormalities. There is mild left ventricular hypertrophy.  Left ventricular diastolic parameters  are indeterminate.  2.  Right ventricular systolic function is normal. The right ventricular  size is normal. Tricuspid regurgitation signal is inadequate for assessing  PA pressure.  3. A small pericardial effusion is present, small pocket  off the  posterior wall , estimated at 1 cm.  4. The mitral valve is normal in structure. No evidence of mitral valve  regurgitation.  5. The aortic valve is normal in structure. Aortic valve regurgitation is  not visualized.  6. The inferior vena cava is normal in size with greater than 50%  respiratory variability, suggesting right atrial pressure of 3 mmHg.   Nuclear stress 10/24/2021: [name] Low risk, probably normal pharmacologic myocardial perfusion stress test. . There is a small in size, mild in severity, reversible defect involving the apical septal and mid anteroseptal myocardium that most likely represents artifact (shifting breast attenuation and RV insertion) and less likely ischemia. This defect is new compared to the study from 08/01/2018. [name] Left ventricular function is normal (LVEF 66%). . There is no significant coronary artery calcification. Aortic atherosclerosis is noted. . There is a fluid density structure adjacent to the right atrium that is not significantly different compared to the prior study from 08/01/2018. There is also small fluid collection adjacent to the lateral wall of the left ventricle. Findings are most consistent with loculated pericardial effusion; pericardial cyst is felt less likely. . Sensitivity and specificity of the study are degraded by body habitus and extracardiac activity.   )       Anesthesia Quick Evaluation

## 2022-03-03 DIAGNOSIS — N186 End stage renal disease: Secondary | ICD-10-CM | POA: Diagnosis not present

## 2022-03-03 DIAGNOSIS — Z992 Dependence on renal dialysis: Secondary | ICD-10-CM | POA: Diagnosis not present

## 2022-03-04 DIAGNOSIS — N2581 Secondary hyperparathyroidism of renal origin: Secondary | ICD-10-CM | POA: Diagnosis not present

## 2022-03-04 DIAGNOSIS — N186 End stage renal disease: Secondary | ICD-10-CM | POA: Diagnosis not present

## 2022-03-04 DIAGNOSIS — Z992 Dependence on renal dialysis: Secondary | ICD-10-CM | POA: Diagnosis not present

## 2022-03-05 DIAGNOSIS — N2581 Secondary hyperparathyroidism of renal origin: Secondary | ICD-10-CM | POA: Diagnosis not present

## 2022-03-05 DIAGNOSIS — N186 End stage renal disease: Secondary | ICD-10-CM | POA: Diagnosis not present

## 2022-03-05 DIAGNOSIS — Z992 Dependence on renal dialysis: Secondary | ICD-10-CM | POA: Diagnosis not present

## 2022-03-07 DIAGNOSIS — Z992 Dependence on renal dialysis: Secondary | ICD-10-CM | POA: Diagnosis not present

## 2022-03-07 DIAGNOSIS — N186 End stage renal disease: Secondary | ICD-10-CM | POA: Diagnosis not present

## 2022-03-07 DIAGNOSIS — N2581 Secondary hyperparathyroidism of renal origin: Secondary | ICD-10-CM | POA: Diagnosis not present

## 2022-03-09 ENCOUNTER — Telehealth: Payer: Self-pay | Admitting: *Deleted

## 2022-03-09 DIAGNOSIS — Z992 Dependence on renal dialysis: Secondary | ICD-10-CM | POA: Diagnosis not present

## 2022-03-09 DIAGNOSIS — N2581 Secondary hyperparathyroidism of renal origin: Secondary | ICD-10-CM | POA: Diagnosis not present

## 2022-03-09 DIAGNOSIS — N186 End stage renal disease: Secondary | ICD-10-CM | POA: Diagnosis not present

## 2022-03-09 NOTE — Telephone Encounter (Signed)
Ortho bundle pre-op call completed. 

## 2022-03-09 NOTE — Care Plan (Signed)
[facility] RNCM call to patient to discuss her upcoming Left total knee arthroplasty with Dr. [name] on 03/13/22. She is an Ortho bundle patient through [facility] and is agreeable to case management. She lives with her sisters and a niece/great-niece, who will be assisting after discharge. She has a RW, 3in1/BSC and cane already. Anticipate HHPT after a short hospital stay. Referral made to [facility] after choice provided. She does go to dialysis M/W/F at [facility]. Reviewed all post op care instructions. Will continue to follow for needs.

## 2022-03-12 DIAGNOSIS — N186 End stage renal disease: Secondary | ICD-10-CM | POA: Diagnosis not present

## 2022-03-12 DIAGNOSIS — N2581 Secondary hyperparathyroidism of renal origin: Secondary | ICD-10-CM | POA: Diagnosis not present

## 2022-03-12 DIAGNOSIS — Z992 Dependence on renal dialysis: Secondary | ICD-10-CM | POA: Diagnosis not present

## 2022-03-12 DIAGNOSIS — E119 Type 2 diabetes mellitus without complications: Secondary | ICD-10-CM | POA: Diagnosis not present

## 2022-03-13 ENCOUNTER — Ambulatory Visit (HOSPITAL_BASED_OUTPATIENT_CLINIC_OR_DEPARTMENT_OTHER): Payer: Medicare HMO | Admitting: Certified Registered Nurse Anesthetist

## 2022-03-13 ENCOUNTER — Other Ambulatory Visit: Payer: Self-pay

## 2022-03-13 ENCOUNTER — Encounter (HOSPITAL_COMMUNITY): Payer: Self-pay | Admitting: Orthopaedic Surgery

## 2022-03-13 ENCOUNTER — Encounter (HOSPITAL_COMMUNITY)
Admission: RE | Disposition: A | Discharge status: Home Health | Payer: Self-pay | Source: Home / Self Care | Attending: Orthopaedic Surgery

## 2022-03-13 ENCOUNTER — Inpatient Hospital Stay (HOSPITAL_COMMUNITY)
Admission: RE | Admit: 2022-03-13 | Discharge: 2022-03-17 | DRG: 469 | Disposition: A | Discharge status: Home Health | Payer: Medicare HMO | Attending: Orthopaedic Surgery | Admitting: Orthopaedic Surgery

## 2022-03-13 ENCOUNTER — Observation Stay (HOSPITAL_COMMUNITY): Payer: Medicare HMO

## 2022-03-13 ENCOUNTER — Ambulatory Visit (HOSPITAL_COMMUNITY): Payer: Medicare HMO | Admitting: Physician Assistant

## 2022-03-13 DIAGNOSIS — N186 End stage renal disease: Secondary | ICD-10-CM | POA: Diagnosis present

## 2022-03-13 DIAGNOSIS — M25462 Effusion, left knee: Secondary | ICD-10-CM | POA: Diagnosis not present

## 2022-03-13 DIAGNOSIS — Z6838 Body mass index (BMI) 38.0-38.9, adult: Secondary | ICD-10-CM | POA: Diagnosis not present

## 2022-03-13 DIAGNOSIS — I5032 Chronic diastolic (congestive) heart failure: Secondary | ICD-10-CM | POA: Diagnosis present

## 2022-03-13 DIAGNOSIS — G8918 Other acute postprocedural pain: Secondary | ICD-10-CM | POA: Diagnosis not present

## 2022-03-13 DIAGNOSIS — Z96651 Presence of right artificial knee joint: Secondary | ICD-10-CM | POA: Diagnosis present

## 2022-03-13 DIAGNOSIS — Z888 Allergy status to other drugs, medicaments and biological substances status: Secondary | ICD-10-CM

## 2022-03-13 DIAGNOSIS — N250 Renal osteodystrophy: Secondary | ICD-10-CM | POA: Diagnosis not present

## 2022-03-13 DIAGNOSIS — Z96642 Presence of left artificial hip joint: Secondary | ICD-10-CM | POA: Diagnosis present

## 2022-03-13 DIAGNOSIS — Z79899 Other long term (current) drug therapy: Secondary | ICD-10-CM

## 2022-03-13 DIAGNOSIS — Z7982 Long term (current) use of aspirin: Secondary | ICD-10-CM

## 2022-03-13 DIAGNOSIS — Z471 Aftercare following joint replacement surgery: Secondary | ICD-10-CM | POA: Diagnosis not present

## 2022-03-13 DIAGNOSIS — Z882 Allergy status to sulfonamides status: Secondary | ICD-10-CM

## 2022-03-13 DIAGNOSIS — F32A Depression, unspecified: Secondary | ICD-10-CM | POA: Diagnosis present

## 2022-03-13 DIAGNOSIS — M1712 Unilateral primary osteoarthritis, left knee: Principal | ICD-10-CM

## 2022-03-13 DIAGNOSIS — E7800 Pure hypercholesterolemia, unspecified: Secondary | ICD-10-CM | POA: Diagnosis present

## 2022-03-13 DIAGNOSIS — E1122 Type 2 diabetes mellitus with diabetic chronic kidney disease: Secondary | ICD-10-CM

## 2022-03-13 DIAGNOSIS — Z7722 Contact with and (suspected) exposure to environmental tobacco smoke (acute) (chronic): Secondary | ICD-10-CM | POA: Diagnosis not present

## 2022-03-13 DIAGNOSIS — Z96652 Presence of left artificial knee joint: Secondary | ICD-10-CM

## 2022-03-13 DIAGNOSIS — G40909 Epilepsy, unspecified, not intractable, without status epilepticus: Secondary | ICD-10-CM | POA: Diagnosis present

## 2022-03-13 DIAGNOSIS — Z9889 Other specified postprocedural states: Secondary | ICD-10-CM | POA: Diagnosis not present

## 2022-03-13 DIAGNOSIS — E119 Type 2 diabetes mellitus without complications: Secondary | ICD-10-CM

## 2022-03-13 DIAGNOSIS — M179 Osteoarthritis of knee, unspecified: Secondary | ICD-10-CM | POA: Diagnosis present

## 2022-03-13 DIAGNOSIS — N2581 Secondary hyperparathyroidism of renal origin: Secondary | ICD-10-CM | POA: Diagnosis present

## 2022-03-13 DIAGNOSIS — Z8249 Family history of ischemic heart disease and other diseases of the circulatory system: Secondary | ICD-10-CM | POA: Diagnosis not present

## 2022-03-13 DIAGNOSIS — I120 Hypertensive chronic kidney disease with stage 5 chronic kidney disease or end stage renal disease: Secondary | ICD-10-CM

## 2022-03-13 DIAGNOSIS — I132 Hypertensive heart and chronic kidney disease with heart failure and with stage 5 chronic kidney disease, or end stage renal disease: Secondary | ICD-10-CM | POA: Diagnosis present

## 2022-03-13 DIAGNOSIS — D631 Anemia in chronic kidney disease: Secondary | ICD-10-CM | POA: Diagnosis not present

## 2022-03-13 DIAGNOSIS — M898X9 Other specified disorders of bone, unspecified site: Secondary | ICD-10-CM | POA: Diagnosis present

## 2022-03-13 DIAGNOSIS — Z992 Dependence on renal dialysis: Secondary | ICD-10-CM | POA: Diagnosis not present

## 2022-03-13 DIAGNOSIS — T82590A Other mechanical complication of surgically created arteriovenous fistula, initial encounter: Secondary | ICD-10-CM | POA: Diagnosis not present

## 2022-03-13 DIAGNOSIS — M659 Synovitis and tenosynovitis, unspecified: Secondary | ICD-10-CM | POA: Diagnosis present

## 2022-03-13 HISTORY — PX: TOTAL KNEE ARTHROPLASTY: SHX125

## 2022-03-13 LAB — POCT I-STAT, CHEM 8
BUN: 46 mg/dL — ABNORMAL HIGH (ref 8–23)
Calcium, Ion: 1 mmol/L — ABNORMAL LOW (ref 1.15–1.40)
Chloride: 101 mmol/L (ref 98–111)
Creatinine, Ser: 8.6 mg/dL — ABNORMAL HIGH (ref 0.44–1.00)
Glucose, Bld: 85 mg/dL (ref 70–99)
HCT: 43 % (ref 36.0–46.0)
Hemoglobin: 14.6 g/dL (ref 12.0–15.0)
Potassium: 4.5 mmol/L (ref 3.5–5.1)
Sodium: 139 mmol/L (ref 135–145)
TCO2: 28 mmol/L (ref 22–32)

## 2022-03-13 LAB — GLUCOSE, CAPILLARY
Glucose-Capillary: 82 mg/dL (ref 70–99)
Glucose-Capillary: 120 mg/dL — ABNORMAL HIGH (ref 70–99)

## 2022-03-13 SURGERY — ARTHROPLASTY, KNEE, TOTAL
Anesthesia: General | Site: Knee | Laterality: Left

## 2022-03-13 MED ORDER — SODIUM CHLORIDE 0.9 % IV SOLN: INTRAVENOUS | Status: DC

## 2022-03-13 MED ORDER — ONDANSETRON HCL 4 MG PO TABS
4.0000 mg | ORAL_TABLET | Freq: Four times a day (QID) | ORAL | Status: DC | PRN
  Administered 2022-03-14: 4 mg via ORAL
  Filled 2022-03-13: qty 1

## 2022-03-13 MED ORDER — CEFAZOLIN SODIUM-DEXTROSE 1-4 GM/50ML-% IV SOLN
1.0000 g | Freq: Four times a day (QID) | INTRAVENOUS | Status: AC
  Administered 2022-03-13 – 2022-03-14 (×2): 1 g via INTRAVENOUS
  Filled 2022-03-13 (×4): qty 50

## 2022-03-13 MED ORDER — LACTATED RINGERS IV SOLN: INTRAVENOUS | Status: DC

## 2022-03-13 MED ORDER — ONDANSETRON HCL 4 MG/2ML IJ SOLN
4.0000 mg | Freq: Four times a day (QID) | INTRAMUSCULAR | Status: DC | PRN
  Administered 2022-03-13: 4 mg via INTRAVENOUS
  Filled 2022-03-13: qty 2

## 2022-03-13 MED ORDER — DOCUSATE SODIUM 100 MG PO CAPS
100.0000 mg | ORAL_CAPSULE | Freq: Two times a day (BID) | ORAL | Status: DC
  Administered 2022-03-13 – 2022-03-17 (×7): 100 mg via ORAL
  Filled 2022-03-13 (×8): qty 1

## 2022-03-13 MED ORDER — ALUM & MAG HYDROXIDE-SIMETH 200-200-20 MG/5ML PO SUSP: 30.0000 mL | ORAL | Status: DC | PRN

## 2022-03-13 MED ORDER — LEVETIRACETAM 500 MG PO TABS
1000.0000 mg | ORAL_TABLET | Freq: Two times a day (BID) | ORAL | Status: DC
  Administered 2022-03-13 – 2022-03-17 (×8): 1000 mg via ORAL
  Filled 2022-03-13 (×8): qty 2

## 2022-03-13 MED ORDER — ACETAMINOPHEN 10 MG/ML IV SOLN
INTRAVENOUS | Status: AC
  Filled 2022-03-13: qty 100

## 2022-03-13 MED ORDER — ALBUTEROL SULFATE HFA 108 (90 BASE) MCG/ACT IN AERS: 2.0000 | INHALATION_SPRAY | Freq: Four times a day (QID) | RESPIRATORY_TRACT | Status: DC | PRN

## 2022-03-13 MED ORDER — FENTANYL CITRATE (PF) 100 MCG/2ML IJ SOLN: 50.0000 ug | Freq: Once | INTRAMUSCULAR | Status: AC

## 2022-03-13 MED ORDER — OXYCODONE HCL 5 MG PO TABS
5.0000 mg | ORAL_TABLET | ORAL | Status: DC | PRN
  Administered 2022-03-13 – 2022-03-15 (×2): 10 mg via ORAL
  Filled 2022-03-13 (×2): qty 2

## 2022-03-13 MED ORDER — METOCLOPRAMIDE HCL 5 MG/ML IJ SOLN
5.0000 mg | Freq: Three times a day (TID) | INTRAMUSCULAR | Status: DC | PRN
  Filled 2022-03-13: qty 2

## 2022-03-13 MED ORDER — CHLORHEXIDINE GLUCONATE 0.12 % MT SOLN
15.0000 mL | Freq: Once | OROMUCOSAL | Status: AC
  Administered 2022-03-13: 15 mL via OROMUCOSAL
  Filled 2022-03-13: qty 15

## 2022-03-13 MED ORDER — FENTANYL CITRATE (PF) 100 MCG/2ML IJ SOLN
INTRAMUSCULAR | Status: AC
  Filled 2022-03-13: qty 2

## 2022-03-13 MED ORDER — CLONIDINE HCL (ANALGESIA) 100 MCG/ML EP SOLN
EPIDURAL | Status: DC | PRN
  Administered 2022-03-13: 100 ug

## 2022-03-13 MED ORDER — PANTOPRAZOLE SODIUM 40 MG PO TBEC
40.0000 mg | DELAYED_RELEASE_TABLET | Freq: Every day | ORAL | Status: DC
  Administered 2022-03-13 – 2022-03-17 (×5): 40 mg via ORAL
  Filled 2022-03-13 (×5): qty 1

## 2022-03-13 MED ORDER — TORSEMIDE 20 MG PO TABS
20.0000 mg | ORAL_TABLET | Freq: Every day | ORAL | Status: DC
  Administered 2022-03-14 – 2022-03-17 (×4): 20 mg via ORAL
  Filled 2022-03-13 (×4): qty 1

## 2022-03-13 MED ORDER — SEVELAMER CARBONATE 800 MG PO TABS: 800.0000 mg | ORAL_TABLET | ORAL | Status: DC

## 2022-03-13 MED ORDER — HYDROMORPHONE HCL 2 MG PO TABS
2.0000 mg | ORAL_TABLET | ORAL | Status: DC | PRN
  Administered 2022-03-14: 3 mg via ORAL
  Administered 2022-03-14 – 2022-03-16 (×4): 2 mg via ORAL
  Administered 2022-03-16: 3 mg via ORAL
  Filled 2022-03-13 (×3): qty 1
  Filled 2022-03-13: qty 2
  Filled 2022-03-13: qty 1

## 2022-03-13 MED ORDER — ATORVASTATIN CALCIUM 40 MG PO TABS
40.0000 mg | ORAL_TABLET | Freq: Every day | ORAL | Status: DC
  Administered 2022-03-14 – 2022-03-17 (×4): 40 mg via ORAL
  Filled 2022-03-13 (×5): qty 1

## 2022-03-13 MED ORDER — ACETAMINOPHEN 10 MG/ML IV SOLN
INTRAVENOUS | Status: DC | PRN
  Administered 2022-03-13: 1000 mg via INTRAVENOUS

## 2022-03-13 MED ORDER — MENTHOL 3 MG MT LOZG: 1.0000 | LOZENGE | OROMUCOSAL | Status: DC | PRN

## 2022-03-13 MED ORDER — ASPIRIN 81 MG PO CHEW
81.0000 mg | CHEWABLE_TABLET | Freq: Two times a day (BID) | ORAL | Status: DC
  Administered 2022-03-13 – 2022-03-17 (×8): 81 mg via ORAL
  Filled 2022-03-13 (×8): qty 1

## 2022-03-13 MED ORDER — DIPHENHYDRAMINE HCL 12.5 MG/5ML PO ELIX: 12.5000 mg | ORAL_SOLUTION | ORAL | Status: DC | PRN

## 2022-03-13 MED ORDER — TRANEXAMIC ACID-NACL 1000-0.7 MG/100ML-% IV SOLN
1000.0000 mg | INTRAVENOUS | Status: AC
  Administered 2022-03-13: 1000 mg via INTRAVENOUS
  Filled 2022-03-13: qty 100

## 2022-03-13 MED ORDER — FENTANYL CITRATE (PF) 100 MCG/2ML IJ SOLN
INTRAMUSCULAR | Status: AC
  Administered 2022-03-13: 50 ug
  Filled 2022-03-13: qty 2

## 2022-03-13 MED ORDER — FENTANYL CITRATE (PF) 250 MCG/5ML IJ SOLN
INTRAMUSCULAR | Status: DC | PRN
  Administered 2022-03-13 (×2): 25 ug via INTRAVENOUS
  Administered 2022-03-13: 50 ug via INTRAVENOUS

## 2022-03-13 MED ORDER — LORAZEPAM 0.5 MG PO TABS: 0.5000 mg | ORAL_TABLET | Freq: Two times a day (BID) | ORAL | Status: DC | PRN

## 2022-03-13 MED ORDER — AMLODIPINE BESYLATE 10 MG PO TABS
10.0000 mg | ORAL_TABLET | Freq: Every day | ORAL | Status: DC
  Administered 2022-03-14 – 2022-03-17 (×3): 10 mg via ORAL
  Filled 2022-03-13 (×5): qty 1

## 2022-03-13 MED ORDER — LIDOCAINE-PRILOCAINE 2.5-2.5 % EX CREA
1.0000 | TOPICAL_CREAM | CUTANEOUS | Status: DC
  Administered 2022-03-14: 1 via TOPICAL
  Filled 2022-03-13: qty 5

## 2022-03-13 MED ORDER — LIDOCAINE 2% (20 MG/ML) 5 ML SYRINGE
INTRAMUSCULAR | Status: DC | PRN
  Administered 2022-03-13: 50 mg via INTRAVENOUS

## 2022-03-13 MED ORDER — PHENYLEPHRINE 80 MCG/ML (10ML) SYRINGE FOR IV PUSH (FOR BLOOD PRESSURE SUPPORT)
PREFILLED_SYRINGE | INTRAVENOUS | Status: DC | PRN
  Administered 2022-03-13 (×9): 80 ug via INTRAVENOUS

## 2022-03-13 MED ORDER — FENTANYL CITRATE (PF) 100 MCG/2ML IJ SOLN
25.0000 ug | INTRAMUSCULAR | Status: DC | PRN
  Administered 2022-03-13 (×3): 50 ug via INTRAVENOUS

## 2022-03-13 MED ORDER — PROPOFOL 10 MG/ML IV BOLUS
INTRAVENOUS | Status: DC | PRN
  Administered 2022-03-13: 150 mg via INTRAVENOUS

## 2022-03-13 MED ORDER — METHOCARBAMOL 1000 MG/10ML IJ SOLN: 500.0000 mg | Freq: Four times a day (QID) | INTRAVENOUS | Status: DC | PRN

## 2022-03-13 MED ORDER — ALBUTEROL SULFATE (2.5 MG/3ML) 0.083% IN NEBU: 2.5000 mg | INHALATION_SOLUTION | Freq: Four times a day (QID) | RESPIRATORY_TRACT | Status: DC | PRN

## 2022-03-13 MED ORDER — SEVELAMER CARBONATE 800 MG PO TABS
800.0000 mg | ORAL_TABLET | Freq: Two times a day (BID) | ORAL | Status: DC | PRN
  Filled 2022-03-13: qty 1

## 2022-03-13 MED ORDER — METOCLOPRAMIDE HCL 5 MG PO TABS: 5.0000 mg | ORAL_TABLET | Freq: Three times a day (TID) | ORAL | Status: DC | PRN

## 2022-03-13 MED ORDER — ONDANSETRON HCL 4 MG/2ML IJ SOLN: 4.0000 mg | Freq: Once | INTRAMUSCULAR | Status: DC | PRN

## 2022-03-13 MED ORDER — MIDAZOLAM HCL 2 MG/2ML IJ SOLN: 1.0000 mg | Freq: Once | INTRAMUSCULAR | Status: AC

## 2022-03-13 MED ORDER — MIDAZOLAM HCL 2 MG/2ML IJ SOLN
INTRAMUSCULAR | Status: AC
  Administered 2022-03-13: 1 mg via INTRAVENOUS
  Filled 2022-03-13: qty 2

## 2022-03-13 MED ORDER — ORAL CARE MOUTH RINSE: 15.0000 mL | Freq: Once | OROMUCOSAL | Status: AC

## 2022-03-13 MED ORDER — DEXAMETHASONE SODIUM PHOSPHATE 10 MG/ML IJ SOLN
INTRAMUSCULAR | Status: DC | PRN
  Administered 2022-03-13: 10 mg via INTRAVENOUS

## 2022-03-13 MED ORDER — ACETAMINOPHEN 325 MG PO TABS: 325.0000 mg | ORAL_TABLET | Freq: Four times a day (QID) | ORAL | Status: DC | PRN

## 2022-03-13 MED ORDER — POVIDONE-IODINE 10 % EX SWAB
2.0000 | Freq: Once | CUTANEOUS | Status: AC
  Administered 2022-03-13: 2 via TOPICAL

## 2022-03-13 MED ORDER — 0.9 % SODIUM CHLORIDE (POUR BTL) OPTIME
TOPICAL | Status: DC | PRN
  Administered 2022-03-13: 1000 mL

## 2022-03-13 MED ORDER — METHOCARBAMOL 500 MG PO TABS
500.0000 mg | ORAL_TABLET | Freq: Four times a day (QID) | ORAL | Status: DC | PRN
  Administered 2022-03-13 – 2022-03-16 (×4): 500 mg via ORAL
  Filled 2022-03-13 (×4): qty 1

## 2022-03-13 MED ORDER — CEFAZOLIN SODIUM-DEXTROSE 2-4 GM/100ML-% IV SOLN
2.0000 g | INTRAVENOUS | Status: AC
  Administered 2022-03-13: 2 g via INTRAVENOUS
  Filled 2022-03-13: qty 100

## 2022-03-13 MED ORDER — PHENOL 1.4 % MT LIQD: 1.0000 | OROMUCOSAL | Status: DC | PRN

## 2022-03-13 MED ORDER — RENA-VITE PO TABS
1.0000 | ORAL_TABLET | Freq: Every day | ORAL | Status: DC
  Administered 2022-03-13 – 2022-03-16 (×4): 1 via ORAL
  Filled 2022-03-13 (×4): qty 1

## 2022-03-13 MED ORDER — HYDROMORPHONE HCL 1 MG/ML IJ SOLN
0.5000 mg | INTRAMUSCULAR | Status: DC | PRN
  Administered 2022-03-13 – 2022-03-14 (×2): 1 mg via INTRAVENOUS
  Filled 2022-03-13: qty 1

## 2022-03-13 MED ORDER — SEVELAMER CARBONATE 800 MG PO TABS
1600.0000 mg | ORAL_TABLET | Freq: Three times a day (TID) | ORAL | Status: DC
  Administered 2022-03-14 – 2022-03-17 (×9): 1600 mg via ORAL
  Filled 2022-03-13 (×9): qty 2

## 2022-03-13 MED ORDER — SODIUM CHLORIDE 0.9 % IR SOLN
Status: DC | PRN
  Administered 2022-03-13: 1000 mL

## 2022-03-13 MED ORDER — SODIUM CHLORIDE 0.9 % IV SOLN: INTRAVENOUS | Status: DC | PRN

## 2022-03-13 MED ORDER — LOSARTAN POTASSIUM 50 MG PO TABS
100.0000 mg | ORAL_TABLET | Freq: Every day | ORAL | Status: DC
  Administered 2022-03-13 – 2022-03-17 (×5): 100 mg via ORAL
  Filled 2022-03-13 (×5): qty 2

## 2022-03-13 MED ORDER — CARVEDILOL 12.5 MG PO TABS
12.5000 mg | ORAL_TABLET | Freq: Two times a day (BID) | ORAL | Status: DC
  Administered 2022-03-13 – 2022-03-17 (×8): 12.5 mg via ORAL
  Filled 2022-03-13 (×9): qty 1

## 2022-03-13 MED ORDER — ROPIVACAINE HCL 5 MG/ML IJ SOLN
INTRAMUSCULAR | Status: DC | PRN
  Administered 2022-03-13 (×6): 5 mL via PERINEURAL

## 2022-03-13 SURGICAL SUPPLY — 74 items
BAG COUNTER SPONGE SURGICOUNT (BAG) ×2 IMPLANT
BAG SPNG CNTER NS LX DISP (BAG) ×1
BANDAGE ESMARK 6X9 LF (GAUZE/BANDAGES/DRESSINGS) ×2 IMPLANT
BLADE SAG 18X100X1.27 (BLADE) ×2 IMPLANT
BNDG CMPR 9X6 STRL LF SNTH (GAUZE/BANDAGES/DRESSINGS) ×1
BNDG ELASTIC 6X5.8 VLCR STR LF (GAUZE/BANDAGES/DRESSINGS) ×4 IMPLANT
BNDG ESMARK 6X9 LF (GAUZE/BANDAGES/DRESSINGS) ×1
BOWL SMART MIX CTS (DISPOSABLE) ×2 IMPLANT
BSPLAT TIB 5D E CMNT STM LT (Knees) ×1 IMPLANT
CEMENT BONE R 1X40 (Cement) ×4 IMPLANT
COOLER ICEMAN CLASSIC (MISCELLANEOUS) IMPLANT
COVER SURGICAL LIGHT HANDLE (MISCELLANEOUS) ×2 IMPLANT
CUFF TOURN SGL QUICK 34 (TOURNIQUET CUFF) ×1
CUFF TOURN SGL QUICK 42 (TOURNIQUET CUFF) IMPLANT
CUFF TRNQT CYL 34X4.125X (TOURNIQUET CUFF) ×2 IMPLANT
DRAPE EXTREMITY T 121X128X90 (DISPOSABLE) ×2 IMPLANT
DRAPE HALF SHEET 40X57 (DRAPES) ×2 IMPLANT
DRAPE U-SHAPE 47X51 STRL (DRAPES) ×2 IMPLANT
DRSG XEROFORM 1X8 (GAUZE/BANDAGES/DRESSINGS) IMPLANT
DURAPREP 26ML APPLICATOR (WOUND CARE) ×2 IMPLANT
ELECT CAUTERY BLADE 6.4 (BLADE) ×2 IMPLANT
ELECT REM PT RETURN 9FT ADLT (ELECTROSURGICAL) ×1
ELECTRODE REM PT RTRN 9FT ADLT (ELECTROSURGICAL) ×2 IMPLANT
FACESHIELD WRAPAROUND (MASK) ×3 IMPLANT
FACESHIELD WRAPAROUND OR TEAM (MASK) ×4 IMPLANT
FEMUR CMT CR STD SZ 6 LT KNEE (Joint) ×1 IMPLANT
FEMUR CMTD CR STD SZ 6 LT KNEE (Joint) IMPLANT
GAUZE PAD ABD 8X10 STRL (GAUZE/BANDAGES/DRESSINGS) ×2 IMPLANT
GAUZE SPONGE 4X4 12PLY STRL (GAUZE/BANDAGES/DRESSINGS) ×2 IMPLANT
GAUZE XEROFORM 1X8 LF (GAUZE/BANDAGES/DRESSINGS) ×2 IMPLANT
GLOVE BIOGEL PI IND STRL 8 (GLOVE) ×4 IMPLANT
GLOVE ORTHO TXT STRL SZ7.5 (GLOVE) ×2 IMPLANT
GLOVE SURG ORTHO 8.0 STRL STRW (GLOVE) ×2 IMPLANT
GOWN STRL REUS W/ TWL LRG LVL3 (GOWN DISPOSABLE) IMPLANT
GOWN STRL REUS W/ TWL XL LVL3 (GOWN DISPOSABLE) ×4 IMPLANT
GOWN STRL REUS W/TWL LRG LVL3 (GOWN DISPOSABLE)
GOWN STRL REUS W/TWL XL LVL3 (GOWN DISPOSABLE) ×2
HANDPIECE INTERPULSE COAX TIP (DISPOSABLE) ×1
HDLS TROCR DRIL PIN KNEE 75 (PIN) ×4
IMMOBILIZER KNEE 22 UNIV (SOFTGOODS) ×2 IMPLANT
IV NS 1000ML (IV SOLUTION) ×1
IV NS 1000ML BAXH (IV SOLUTION) ×2 IMPLANT
KIT BASIN OR (CUSTOM PROCEDURE TRAY) ×2 IMPLANT
KIT TURNOVER KIT B (KITS) ×2 IMPLANT
MANIFOLD NEPTUNE II (INSTRUMENTS) ×2 IMPLANT
NDL 18GX1X1/2 (RX/OR ONLY) (NEEDLE) IMPLANT
NEEDLE 18GX1X1/2 (RX/OR ONLY) (NEEDLE) IMPLANT
NS IRRIG 1000ML POUR BTL (IV SOLUTION) ×2 IMPLANT
PACK TOTAL JOINT (CUSTOM PROCEDURE TRAY) ×2 IMPLANT
PAD ABD 7.5X8 STRL (GAUZE/BANDAGES/DRESSINGS) IMPLANT
PAD ARMBOARD 7.5X6 YLW CONV (MISCELLANEOUS) ×2 IMPLANT
PAD COLD SHLDR WRAP-ON (PAD) IMPLANT
PADDING CAST COTTON 6X4 STRL (CAST SUPPLIES) ×2 IMPLANT
PIN DRILL HDLS TROCAR 75 4PK (PIN) IMPLANT
SCREW FEMALE HEX FIX 25X2.5 (ORTHOPEDIC DISPOSABLE SUPPLIES) IMPLANT
SET HNDPC FAN SPRY TIP SCT (DISPOSABLE) ×2 IMPLANT
SET PAD KNEE POSITIONER (MISCELLANEOUS) ×2 IMPLANT
STAPLER VISISTAT 35W (STAPLE) ×2 IMPLANT
STEM POLY PAT PLY 35M KNEE (Knees) IMPLANT
STEM TIBIA 5 DEG SZ E L KNEE (Knees) IMPLANT
STEM TIBIAL SZ6-7 EF 12 LT (Knees) IMPLANT
SUCTION FRAZIER HANDLE 10FR (MISCELLANEOUS) ×1
SUCTION TUBE FRAZIER 10FR DISP (MISCELLANEOUS) ×2 IMPLANT
SUT VIC AB 0 CT1 27 (SUTURE) ×1
SUT VIC AB 0 CT1 27XBRD ANBCTR (SUTURE) ×2 IMPLANT
SUT VIC AB 1 CT1 27 (SUTURE) ×2
SUT VIC AB 1 CT1 27XBRD ANBCTR (SUTURE) ×4 IMPLANT
SUT VIC AB 2-0 CT1 27 (SUTURE) ×2
SUT VIC AB 2-0 CT1 TAPERPNT 27 (SUTURE) ×4 IMPLANT
SYR 50ML LL SCALE MARK (SYRINGE) IMPLANT
TIBIA STEM 5 DEG SZ E L KNEE (Knees) ×1 IMPLANT
TOWEL GREEN STERILE (TOWEL DISPOSABLE) ×2 IMPLANT
TOWEL GREEN STERILE FF (TOWEL DISPOSABLE) ×2 IMPLANT
WRAP KNEE MAXI GEL POST OP (GAUZE/BANDAGES/DRESSINGS) ×2 IMPLANT

## 2022-03-13 NOTE — Transfer of Care (Signed)
Immediate Anesthesia Transfer of Care Note  Patient: Tonnia Bardin Cassin  Procedure(s) Performed: LEFT TOTAL KNEE ARTHROPLASTY (Left: Knee)  Patient Location: PACU  Anesthesia Type:GA combined with regional for post-op pain  Level of Consciousness: awake, alert , and oriented  Airway & Oxygen Therapy: Patient Spontanous Breathing and Patient connected to nasal cannula oxygen  Post-op Assessment: Report given to RN, Post -op Vital signs reviewed and stable, Patient moving all extremities X 4, and Patient able to stick tongue midline  Post vital signs: Reviewed  Last Vitals:  Vitals Value Taken Time  BP 83/53   Temp 98.4   Pulse 56 03/13/22 1700  Resp 17 03/13/22 1700  SpO2 92 % 03/13/22 1700  Vitals shown include unvalidated device data.  Last Pain:  Vitals:   03/13/22 1430  TempSrc:   PainSc: 0-No pain      Patients Stated Pain Goal: 0 (16/38/45 3646)  Complications: No notable events documented.

## 2022-03-13 NOTE — Anesthesia Procedure Notes (Signed)
Procedure Name: LMA Insertion Date/Time: 03/13/2022 3:08 PM  Performed by: Reeves Dam, CRNAPre-anesthesia Checklist: Patient identified, Patient being monitored, Timeout performed, Emergency Drugs available and Suction available Patient Re-evaluated:Patient Re-evaluated prior to induction Oxygen Delivery Method: Circle system utilized Preoxygenation: Pre-oxygenation with 100% oxygen Induction Type: IV induction Ventilation: Mask ventilation without difficulty LMA: LMA inserted LMA Size: 4.0 Tube type: Oral Number of attempts: 1 Placement Confirmation: positive ETCO2 and breath sounds checked- equal and bilateral Tube secured with: Tape Dental Injury: Teeth and Oropharynx as per pre-operative assessment

## 2022-03-13 NOTE — Anesthesia Postprocedure Evaluation (Signed)
Anesthesia Post Note  Patient: Carrie [name]  Procedure(s) Performed: LEFT TOTAL KNEE ARTHROPLASTY (Left: Knee)     Patient location during evaluation: PACU Anesthesia Type: General Level of consciousness: awake and alert, patient cooperative and oriented Pain management: pain level controlled Vital Signs Assessment: post-procedure vital signs reviewed and stable Respiratory status: spontaneous breathing, nonlabored ventilation and respiratory function stable Cardiovascular status: blood pressure returned to baseline and stable Postop Assessment: no apparent nausea or vomiting Anesthetic complications: no   No notable events documented.  Last Vitals:  Vitals:   03/13/22 1745 03/13/22 1800  BP: (!) 147/80 (!) 144/78  Pulse: 67 68  Resp: 15 12  Temp:  36.7 C  SpO2: 98% 98%    Last Pain:  Vitals:   03/13/22 1800  TempSrc:   PainSc: 4                  Seanne Chirico,E. Valmai Vandenberghe

## 2022-03-13 NOTE — H&P (Signed)
TOTAL KNEE ADMISSION H&P  Patient is being admitted for left total knee arthroplasty.  Subjective:  Chief Complaint:left knee pain.  HPI: Carrie [name], 71 y.o. female, has a history of pain and functional disability in the left knee due to arthritis and has failed non-surgical conservative treatments for greater than 12 weeks to includeNSAID's and/or analgesics, corticosteriod injections, viscosupplementation injections, flexibility and strengthening excercises, supervised PT with diminished ADL's post treatment, use of assistive devices, weight reduction as appropriate, and activity modification.  Onset of symptoms was gradual, starting 2 years ago with gradually worsening course since that time. The patient noted no past surgery on the left knee(s).  Patient currently rates pain in the left knee(s) at 10 out of 10 with activity. Patient has night pain, worsening of pain with activity and weight bearing, pain that interferes with activities of daily living, pain with passive range of motion, crepitus, and joint swelling.  Patient has evidence of subchondral sclerosis, periarticular osteophytes, and joint space narrowing by imaging studies. There is no active infection.  Patient Active Problem List   Diagnosis Date Noted   Arthritis of left knee 03/13/2022   Chronic diastolic heart failure ([place]) 05/17/2021   Seizure disorder ([place])    Hyperlipidemia    SOB (shortness of breath) 05/01/2021   Anemia 05/01/2021   Positive FIT (fecal immunochemical test)    Polyp of ascending colon    Polyp of descending colon    Polyp of transverse colon    Dysphagia 02/07/2021   Tremor 12/01/2020   Headache 12/01/2020   Eustachian tube dysfunction 10/25/2020   History of shingles 09/26/2020   Primary osteoarthritis of left knee 76/16/0737   Complication from renal dialysis device 11/20/2018   ESRD on hemodialysis ([place]) 08/04/2018   Status post total right knee replacement 04/30/2017   History of total  hip replacement, left 03/18/2017   Status post total replacement of left hip 03/27/2016   Mood disorder ([place]) 04/01/2015   Osteoarthritis of right knee 02/03/2014   Advanced directives, counseling/discussion 02/03/2014   Routine general medical examination at a health care facility 07/08/2012   Morbid obesity ([place]) 07/08/2012   Otitis externa 03/20/2010   HYPERCHOLESTEROLEMIA 09/26/2006   Essential hypertension 09/26/2006   ALLERGIC RHINITIS 09/26/2006   History of meningioma 08/03/2006   Past Medical History:  Diagnosis Date   Allergy    Anemia    Arthritis    "right knee" (10/26'/2017)   Chronic heart failure with preserved ejection fraction (HFpEF) ([place])    a. 04/2021 Echo: EF >55%, GrI DD, nl RV fxn, triv MR; b. 12/2021 Echo: EF 60-65%, no rwma.   Demand ischemia    a. 04/2021 HsTrop up to 73 in setting of volume overload; b. 10/2021 MV: EF 66% with small, mild reversible defect in the apical septal and mid anteroseptal myocardium which was felt to most likely represent artifact.   Depression    ESRD (end stage renal disease) ([place])    a. OnHD (Dr. Lavonia Dana)   History of blood transfusion 2008   "when I had brain tumor surgery"   History of MRSA infection 2008   History of stress test    a. 07/2018 MV: EF 66%, no ischemia/infarct.   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 12/2021 Echo: EF 60-65%, no rwma, mild LVH, nl RV fxn, small pericardial effusion - small pocket of post wall, ~ 1cm.   Precordial chest pain  a. 10/2021 MV: low risk.   Type II diabetes mellitus ([place]) 2011   on metformin until yr ago- no med now - off due to kidneys    Past Surgical History:  Procedure Laterality Date   A/V FISTULAGRAM Left 03/10/2019   Procedure: A/V FISTULAGRAM;  Surgeon: Katha Cabal, MD;  Location: [place] [facility];  Service: Cardiovascular;  Laterality: Left;   A/V FISTULAGRAM Left 07/14/2019   Procedure: A/V FISTULAGRAM;   Surgeon: Katha Cabal, MD;  Location: [place] [facility];  Service: Cardiovascular;  Laterality: Left;   A/V FISTULAGRAM Left 01/07/2020   Procedure: A/V FISTULAGRAM;  Surgeon: Katha Cabal, MD;  Location: [place] [facility];  Service: Cardiovascular;  Laterality: Left;   A/V FISTULAGRAM Left 11/14/2021   Procedure: A/V Fistulagram;  Surgeon: Katha Cabal, MD;  Location: [place] [facility];  Service: Cardiovascular;  Laterality: Left;   AV FISTULA PLACEMENT Left 11/07/2018   Procedure: ARTERIOVENOUS (AV) FISTULA CREATION ( BRACHIO BASILIC ) VS BRACHIAL AXILLARY GRAFT;  Surgeon: Katha Cabal, MD;  Location: [facility];  Service: Vascular;  Laterality: Left;   BRAIN MENINGIOMA EXCISION  08/2006   Foramina magnum meningioma   CERVICAL DISCECTOMY  1996   Dr. Alric Seton   CESAREAN SECTION  1981   COLONOSCOPY     COLONOSCOPY WITH PROPOFOL N/A 03/07/2021   Procedure: COLONOSCOPY WITH PROPOFOL;  Surgeon: Virgel Manifold, MD;  Location: [facility];  Service: Endoscopy;  Laterality: N/A;   DIALYSIS/PERMA CATHETER INSERTION N/A 08/04/2018   Procedure: DIALYSIS/PERMA CATHETER INSERTION;  Surgeon: Algernon Huxley, MD;  Location: [place] [facility];  Service: Cardiovascular;  Laterality: N/A;   DIALYSIS/PERMA CATHETER REMOVAL N/A 12/25/2018   Procedure: DIALYSIS/PERMA CATHETER REMOVAL;  Surgeon: Algernon Huxley, MD;  Location: [place] [facility];  Service: Cardiovascular;  Laterality: N/A;   DILATION AND CURETTAGE OF UTERUS     JOINT REPLACEMENT     right knee left hip   Right wrist x-ray  2007   Deg. at 1st MCP   TOTAL HIP ARTHROPLASTY Left 03/27/2016   Procedure: LEFT TOTAL HIP ARTHROPLASTY ANTERIOR APPROACH;  Surgeon: Mcarthur Rossetti, MD;  Location: [place];  Service: Orthopedics;  Laterality: Left;   TOTAL KNEE ARTHROPLASTY Right 04/30/2017   Procedure: RIGHT TOTAL KNEE ARTHROPLASTY;  Surgeon: Mcarthur Rossetti, MD;  Location: [place];  Service: Orthopedics;   Laterality: Right;   TUBAL LIGATION  1981    Current Facility-Administered Medications  Medication Dose Route Frequency Provider Last Rate Last Admin   ceFAZolin (ANCEF) IVPB 2g/100 mL premix  2 g Intravenous On Call to OR Pete Pelt, PA-C       fentaNYL (SUBLIMAZE) 100 MCG/2ML injection            lactated ringers infusion   Intravenous Continuous Albertha Ghee, MD 10 mL/hr at 03/13/22 1319 New Bag at 03/13/22 1319   midazolam (VERSED) 2 MG/2ML injection            tranexamic acid (CYKLOKAPRON) IVPB 1,000 mg  1,000 mg Intravenous To OR Pete Pelt, PA-C       Allergies  Allergen Reactions   Naproxen Sodium Anaphylaxis and Swelling   Sulfamethoxazole-Trimethoprim Anaphylaxis and Swelling    Social History   Tobacco Use   Smoking status: Never    Passive exposure: Past   Smokeless tobacco: Never  Substance Use Topics   Alcohol use: No    Alcohol/week: 0.0 standard drinks of alcohol  Family History  Problem Relation Age of Onset   Hypertension Other        Strong family history    Breast cancer Mother 20   Heart failure Sister    Heart disease Sister      Review of Systems  Objective:  Physical Exam Vitals reviewed.  Constitutional:      Appearance: Normal appearance. She is obese.  HENT:     Head: Normocephalic and atraumatic.  Eyes:     Extraocular Movements: Extraocular movements intact.     Pupils: Pupils are equal, round, and reactive to light.  Cardiovascular:     Rate and Rhythm: Normal rate.  Pulmonary:     Effort: Pulmonary effort is normal.     Breath sounds: Normal breath sounds.  Abdominal:     Palpations: Abdomen is soft.  Musculoskeletal:     Cervical back: Normal range of motion and neck supple.     Left knee: Effusion, bony tenderness and crepitus present. Decreased range of motion. Tenderness present over the medial joint line and lateral joint line. Abnormal alignment and abnormal meniscus.  Neurological:     Mental Status: She  is alert and oriented to person, place, and time.  Psychiatric:        Behavior: Behavior normal.     Vital signs in last 24 hours: Temp:  [97.7 F (36.5 C)] 97.7 F (36.5 C) (10/10 1234) Pulse Rate:  [78] 78 (10/10 1234) Resp:  [18] 18 (10/10 1234) BP: (175)/(82) 175/82 (10/10 1234) SpO2:  [97 %] 97 % (10/10 1234) Weight:  [97.5 kg] 97.5 kg (10/10 1234)  Labs:   Estimated body mass index is 39.32 kg/m as calculated from the following:   Height as of this encounter: '5\' 2"'$  (1.575 m).   Weight as of this encounter: 97.5 kg.   Imaging Review Plain radiographs demonstrate severe degenerative joint disease of the left knee(s). The overall alignment ismild varus. The bone quality appears to be fair for age and reported activity level.      Assessment/Plan:  End stage arthritis, left knee   The patient history, physical examination, clinical judgment of the provider and imaging studies are consistent with end stage degenerative joint disease of the left knee(s) and total knee arthroplasty is deemed medically necessary. The treatment options including medical management, injection therapy arthroscopy and arthroplasty were discussed at length. The risks and benefits of total knee arthroplasty were presented and reviewed. The risks due to aseptic loosening, infection, stiffness, patella tracking problems, thromboembolic complications and other imponderables were discussed. The patient acknowledged the explanation, agreed to proceed with the plan and consent was signed. Patient is being admitted for inpatient treatment for surgery, pain control, PT, OT, prophylactic antibiotics, VTE prophylaxis, progressive ambulation and ADL's and discharge planning. The patient is planning to be discharged home with home health services

## 2022-03-13 NOTE — Op Note (Signed)
Operative Note  Date of operation: 03/13/2022 Preoperative diagnosis: Left knee osteoarthritis and degenerative joint disease Postoperative diagnosis: Same  Procedure: Left cemented total knee arthroplasty  Implants: Biomet/Zimmer persona knee system with size 6 standard left CR femur, size E left tibial tray, 12 mm thickness fixed-bearing medial congruent left polyethylene insert, 35 mm patella button  Surgeon: Lind Guest. [name], MD Assistant: Benita Stabile, PA-C  Anesthesia: #1 left lower extremity adductor canal block, #2 General Tourniquet time: Less than 1 hour EBL: Less than 100 cc Antibiotics: 2 g IV Ancef Complications: None  Indications: The patient is a 71 year old female well-known to me.  She has well-documented osteoarthritis involving her left knee.  She has tried and failed all forms conservative treatment and wishes to proceed with a left total knee arthroplasty.  We have actually replaced her right knee so she is fully aware what the surgery involves including a thorough understanding of the risk and benefits of surgery.  She has been pleased with a right total knee arthroplasty.  Procedure description after informed consent was obtained and appropriate left knee was marked, an adductor canal block was obtained of the left lower extremity holding room and the patient was brought to the operating room and placed upon the operating table.  General anesthesia was then obtained.  A nonsterile tourniquet is placed around her upper left thigh and her left thigh, knee, leg, ankle and foot were prepped and draped with DuraPrep and sterile drapes including a sterile stockinette.  A timeout was called and she was then applied as correct patient and correct the left knee.  An Esmarch was then used to wrap out the leg and the tourniquet was plated to 300 mm of pressure.  A direct midline incision was then made over the patella and carried proximally and distally.  Dissection was  carried down to the knee joint and a medial parapatellar arthrotomy was carried out.  A large joint effusion was found as well as significant synovitis in the knee as well as arthritis in all 3 compartments.  With the knee in a flexed position we remove remnants of the ACL, medial lateral meniscus as well as osteophytes in all of the compartments.  We then set her extramedullary cutting guide for making her proximal tibia cut correction for varus and valgus and a 3 degree slope.  We made this cut to take 2 mm off the low side.  We made the cut without difficulty.  We then went to the femur and used the intramedullary guide for making her distal femoral cut for a left knee at 5 degrees external rotated and a 10 mm distal femoral cut.  This Was also made without difficulty.  We brought the knee back down full extension and a 10 mm extension block and helped achieve full extension.  We then back to the femur and put a femoral sizing guide based off the epicondylar axis.  Based off of this we chose a size 6 femur.  We put a 4-in-1 cutting block for size 6 femur and made her anterior posterior cuts followed our chamfer cuts.  We then back to the tibia and chose a size E left tibial tray for coverage over the tibial plateau so the rotation of the tibial tubercle and the femur.  We made our keel punch and drill hole off of this.  We then trialed our size E left tibia followed by our size 6 left femur.  We placed a 12 mm medial congruent  poly then insert and we are pleased with range of motion and stability without insert.  We made a patella cut and drilled 3 holes for a size 35 patella button.  We then put the knee through several cycles of motion with all trials rotation in place.  We then removed all transportation from the knee and irrigate the knee with normal saline solution.  The cement was then mixed in with the knee in a flexed position we cemented our Biomet Zimmer persona tibial tray for left knee size E followed  by cementing our size 6 left CR standard femur.  We removed excess cement debris from the knee and placed our medial congruent 12 mm thickness left fixed-bearing poly then insert.  We also cemented our size 35 patella button.  I then held the knee fully extended while the cemented hardened and compressed the knee.  We then let the tourniquet down and hemostasis was obtained electrocautery.  The arthrotomy was closed interrupted #1 Vicryl suture followed by 0 Vicryl close deep tissue and 2-0 Vicryl close subcutaneous tissue.  The skin was closed with staples.  Well-padded sterile dressing was applied.  The patient was awakened, extubated and taken the recovery room in stable condition with all final counts being correct and no complications noted.  Benita Stabile, PA-C assisted during the entire case and his assistance was crucial and medically necessary for helping retract soft tissues and guide implant placement as well as a direct layered closure of the wound.

## 2022-03-13 NOTE — Anesthesia Procedure Notes (Signed)
Anesthesia Regional Block: Adductor canal block   Pre-Anesthetic Checklist: , timeout performed,  Correct Patient, Correct Site, Correct Laterality,  Correct Procedure, Correct Position, site marked,  Risks and benefits discussed,  Surgical consent,  Pre-op evaluation,  At surgeon's request and post-op pain management  Laterality: Lower and Left  Prep: chloraprep       Needles:  Injection technique: Single-shot  Needle Type: Echogenic Stimulator Needle     Needle Length: 9cm  Needle Gauge: 20   Needle insertion depth: 3 cm   Additional Needles:   Procedures:,,,, ultrasound used (permanent image in chart),,    Narrative:  Start time: 03/13/2022 2:25 PM End time: 03/13/2022 2:35 PM Injection made incrementally with aspirations every 5 mL. Anesthesiologist: Lyn Hollingshead, MD

## 2022-03-14 ENCOUNTER — Encounter (HOSPITAL_COMMUNITY): Payer: Self-pay | Admitting: Orthopaedic Surgery

## 2022-03-14 DIAGNOSIS — I5032 Chronic diastolic (congestive) heart failure: Secondary | ICD-10-CM | POA: Diagnosis not present

## 2022-03-14 DIAGNOSIS — N186 End stage renal disease: Secondary | ICD-10-CM | POA: Diagnosis not present

## 2022-03-14 DIAGNOSIS — Z992 Dependence on renal dialysis: Secondary | ICD-10-CM | POA: Diagnosis not present

## 2022-03-14 DIAGNOSIS — M1712 Unilateral primary osteoarthritis, left knee: Secondary | ICD-10-CM | POA: Diagnosis not present

## 2022-03-14 DIAGNOSIS — N250 Renal osteodystrophy: Secondary | ICD-10-CM | POA: Diagnosis not present

## 2022-03-14 DIAGNOSIS — I132 Hypertensive heart and chronic kidney disease with heart failure and with stage 5 chronic kidney disease, or end stage renal disease: Secondary | ICD-10-CM | POA: Diagnosis not present

## 2022-03-14 DIAGNOSIS — D631 Anemia in chronic kidney disease: Secondary | ICD-10-CM | POA: Diagnosis not present

## 2022-03-14 LAB — BASIC METABOLIC PANEL
Anion gap: 13 (ref 5–15)
BUN: 42 mg/dL — ABNORMAL HIGH (ref 8–23)
CO2: 23 mmol/L (ref 22–32)
Calcium: 8.4 mg/dL — ABNORMAL LOW (ref 8.9–10.3)
Chloride: 99 mmol/L (ref 98–111)
Creatinine, Ser: 8.5 mg/dL — ABNORMAL HIGH (ref 0.44–1.00)
GFR, Estimated: 5 mL/min — ABNORMAL LOW (ref >60)
Glucose, Bld: 204 mg/dL — ABNORMAL HIGH (ref 70–99)
Potassium: 4.8 mmol/L (ref 3.5–5.1)
Sodium: 135 mmol/L (ref 135–145)

## 2022-03-14 LAB — CBC
HCT: 36.2 % (ref 36.0–46.0)
Hemoglobin: 11.8 g/dL — ABNORMAL LOW (ref 12.0–15.0)
MCH: 30.7 pg (ref 26.0–34.0)
MCHC: 32.6 g/dL (ref 30.0–36.0)
MCV: 94.3 fL (ref 80.0–100.0)
Platelets: 178 10*3/uL (ref 150–400)
RBC: 3.84 MIL/uL — ABNORMAL LOW (ref 3.87–5.11)
RDW: 14.8 % (ref 11.5–15.5)
WBC: 10.3 10*3/uL (ref 4.0–10.5)
nRBC: 0 % (ref 0.0–0.2)

## 2022-03-14 LAB — GLUCOSE, CAPILLARY: Glucose-Capillary: 140 mg/dL — ABNORMAL HIGH (ref 70–99)

## 2022-03-14 MED ORDER — PENTAFLUOROPROP-TETRAFLUOROETH EX AERO: 1.0000 | INHALATION_SPRAY | CUTANEOUS | Status: DC | PRN

## 2022-03-14 MED ORDER — LIDOCAINE-PRILOCAINE 2.5-2.5 % EX CREA: 1.0000 | TOPICAL_CREAM | CUTANEOUS | Status: DC | PRN

## 2022-03-14 MED ORDER — HYDROMORPHONE HCL 1 MG/ML IJ SOLN
INTRAMUSCULAR | Status: AC
  Filled 2022-03-14: qty 1

## 2022-03-14 MED ORDER — HYDROMORPHONE HCL 2 MG PO TABS
ORAL_TABLET | ORAL | Status: AC
  Filled 2022-03-14: qty 2

## 2022-03-14 MED ORDER — LIDOCAINE HCL (PF) 1 % IJ SOLN: 5.0000 mL | INTRAMUSCULAR | Status: DC | PRN

## 2022-03-14 MED ORDER — CHLORHEXIDINE GLUCONATE CLOTH 2 % EX PADS
6.0000 | MEDICATED_PAD | Freq: Every day | CUTANEOUS | Status: DC
  Administered 2022-03-14 – 2022-03-17 (×4): 6 via TOPICAL

## 2022-03-14 NOTE — Progress Notes (Signed)
Physical Therapy Treatment Patient Details Name: Carrie [name] MRN: [id number] DOB: April 15, 1951 Today's Date: 03/14/2022   History of Present Illness Pt is 71 yo female Admitted for L TKA on 10/10 with WBAT.  Pt  has a past medical history of Allergy, Anemia, Arthritis, Chronic heart failure with preserved ejection fraction ), Demand ischemia, Depression, ESRD, Hypertension, Meningioma, Pericardial effusion, Precordial chest pain, and Type II diabetes mellitus    PT Comments    Pt had reported dizziness earlier with suspected orthostatic hypotension, she then had HD. Pt was in HD chair and needing to get to [facility] and then bed.  RN into assist PT due to risk of orthostatic hypotension.  Pt did have syncopal episode on BSC with BP of 62/39 . She recovered quickly when returned to supine.  Once recovered did some supine exercises but session overall limited due to orthostatic BP and pain.   Recommendations for follow up therapy are one component of a multi-disciplinary discharge planning process, led by the attending physician.  Recommendations may be updated based on patient status, additional functional criteria and insurance authorization.  Follow Up Recommendations  Follow physician's recommendations for discharge plan and follow up therapies     Assistance Recommended at Discharge Frequent or constant Supervision/Assistance  Patient can return home with the following A lot of help with walking and/or transfers;A lot of help with bathing/dressing/bathroom;Assistance with cooking/housework;Help with stairs or ramp for entrance   Equipment Recommendations  Rolling walker (2 wheels)    Recommendations for Other Services       Precautions / Restrictions Precautions Precautions: Knee;Fall Precaution Comments: Orthostatic hypotension Restrictions LLE Weight Bearing: Weight bearing as tolerated     Mobility  Bed Mobility Overal bed mobility: Needs Assistance Bed Mobility: Sit to  Supine       Sit to supine: Max assist, +2 for physical assistance   General bed mobility comments: Due to syncope    Transfers Overall transfer level: Needs assistance Equipment used: Rolling walker (2 wheels) Transfers: Sit to/from Stand, Bed to chair/wheelchair/BSC Sit to Stand: Mod assist, +2 safety/equipment Stand pivot transfers: Max assist, +2 physical assistance Step pivot transfers: Min assist, +2 safety/equipment       General transfer comment: Initally stood from chair with mod A of 2 for safety with cues for hand placement and L LE management.  Step pivot to BSC min A of 2 for safety.  Pt became syncopal on BSC requiring bed to be moved close and max x 2 pivot back to bed. See vitals below.    Ambulation/Gait               General Gait Details: unable other than step pivot due to orthostatic hypotension   Stairs             Wheelchair Mobility    Modified Rankin (Stroke Patients Only)       Balance Overall balance assessment: Needs assistance Sitting-balance support: No upper extremity supported Sitting balance-Leahy Scale: Fair     Standing balance support: Bilateral upper extremity supported Standing balance-Leahy Scale: Poor Standing balance comment: RW and assist                            Cognition Arousal/Alertness: Awake/alert Behavior During Therapy: WFL for tasks assessed/performed Overall Cognitive Status: Within Functional Limits for tasks assessed  Exercises Total Joint Exercises Ankle Circles/Pumps: AROM, Both, 10 reps, Supine Quad Sets: AROM, Supine, Both, 10 reps Other Exercises Other Exercises: unable to tolerate further due to pain    General Comments General comments (skin integrity, edema, etc.): Noted that earlier today during therapy pt with c/o dizziness and suspected orthostatic hypotension.  She since received HD.  Pt was in HD chair and  needing to use BSC before return to bed. RN into assist PT due to expecting potential orthostatic hypotension.  Pt did become dizzy with transfer and able to get to [facility], but then became unresponsive, eyes open, breathing.  Checked and BP initially read 100/89 , repositioned pt's arm-retook and was 62/39. While BP taking slid bed and bsc next to each other.  Nurse tech also into assist.  Pt starting to come around and was max x 2 pivot back to bed.  Return to supine BP 93/66 and after 5 mins 126/62 with head to 20 degrees.      Pertinent Vitals/Pain Pain Assessment Pain Assessment: Faces Faces Pain Scale: Hurts whole lot Pain Location: L knee Pain Descriptors / Indicators: Aching, Grimacing, Guarding Pain Intervention(s): Limited activity within patient's tolerance, Monitored during session, Patient requesting pain meds-RN notified (no premedicated b/c just returned from HD, in chair, needing to use [facility])    Home Living                          Prior Function            PT Goals (current goals can now be found in the care plan section) Progress towards PT goals: Not progressing toward goals - comment (limited by pain and orthostatic)    Frequency    7X/week      PT Plan Current plan remains appropriate    Co-evaluation              AM-PAC PT "6 Clicks" Mobility   Outcome Measure  Help needed turning from your back to your side while in a flat bed without using bedrails?: A Lot Help needed moving from lying on your back to sitting on the side of a flat bed without using bedrails?: A Little Help needed moving to and from a bed to a chair (including a wheelchair)?: A Lot Help needed standing up from a chair using your arms (e.g., wheelchair or bedside chair)?: A Lot Help needed to walk in hospital room?: Total (limited due to orthostatic) Help needed climbing 3-5 steps with a railing? : Total 6 Click Score: 11    End of Session Equipment Utilized During  Treatment: Gait belt Activity Tolerance: Treatment limited secondary to medical complications (Comment) (orthostatic) Patient left: in bed;with call bell/phone within reach;with bed alarm set Nurse Communication: Mobility status (RN present) PT Visit Diagnosis: Unsteadiness on feet (R26.81);Other abnormalities of gait and mobility (R26.89);Pain Pain - Right/Left: Left Pain - part of body: Knee     Time: 7096-2836 PT Time Calculation (min) (ACUTE ONLY): 25 min  Charges:  $Therapeutic Exercise: 8-22 mins $Therapeutic Activity: 8-22 mins                     Abran Richard, PT Acute Rehab [facility] Rehab [phone]    Karlton Lemon 03/14/2022, 5:04 PM

## 2022-03-14 NOTE — Evaluation (Signed)
Physical Therapy Evaluation Patient Details Name: Carrie [name] MRN: [id number] DOB: 04/23/1951 Today's Date: 03/14/2022  History of Present Illness  Admitted for L TKA, done 10/10; WBAT;  has a past medical history of Allergy, Anemia, Arthritis, Chronic heart failure with preserved ejection fraction (HFpEF) ([place]), Demand ischemia, Depression, ESRD (end stage renal disease) ([place]), History of blood transfusion (2008), History of MRSA infection (2008), History of stress test, Hypertension, Meningioma ([place]), Pericardial effusion, Precordial chest pain, and Type II diabetes mellitus ([place]) (2011).  Clinical Impression   Pt admitted with above diagnosis. Lives at home with family, in a 2-level home (pt can stay on main level) with 3 steps to enter; Prior to admission, pt was able to manage modified independently, including driving herself to/from HD; Presents to PT with decr functional mobility, decr activity tolerance, decr L knee single limb stance stability, and L knee pain;  Needing min assist to stand, and mod assist and close guard of L knee when wlaking without KI; Notably dizzy with upright activity and walking; Dizziness resolved once sitting; Pt currently with functional limitations due to the deficits listed below (see PT Problem List). Pt will benefit from skilled PT to increase their independence and safety with mobility to allow discharge to the venue listed below.       Will plan for orthostatics next session     Recommendations for follow up therapy are one component of a multi-disciplinary discharge planning process, led by the attending physician.  Recommendations may be updated based on patient status, additional functional criteria and insurance authorization.  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     Precautions / Restrictions Precautions Precautions: Knee;Fall Precaution Booklet Issued: Yes (comment) Precaution Comments: Pt educated to not allow any pillow or bolster under knee for healing with optimal range of motion.  Restrictions LLE Weight Bearing: Weight bearing as tolerated      Mobility  Bed Mobility               General bed mobility comments: in recliner upon arrival sleeps in recliner at home    Transfers Overall transfer level: Needs assistance Equipment used: Rolling walker (2 wheels) Transfers: Sit to/from Stand Sit to Stand: Min assist           General transfer comment: Initially stood without phsyical assist, notable grimace, and did not get fully to stand before needing to sit back down; Second attempt with min assist to steady successful    Ambulation/Gait Ambulation/Gait assistance: Mod assist, +2 safety/equipment Gait Distance (Feet): 6 Feet Assistive device: Rolling walker (2 wheels) Gait Pattern/deviations: Step-to pattern, Antalgic Gait velocity: quite slow     General Gait Details: Cues for sequence and to activate quad for L stance stabiltiy; manual monitor and stabilization of L knee as needed; slow steps, with frequent small knee buckles and manual assist/support as needed; Distance limited by dizziness  Stairs  Wheelchair Mobility    Modified Rankin (Stroke Patients Only)       Balance Overall balance assessment: Needs  assistance           Standing balance-Leahy Scale: Poor                               Pertinent Vitals/Pain Pain Assessment Pain Assessment: 0-10 Pain Score: 5  Pain Location: L knee with flexion Pain Descriptors / Indicators: Aching, Grimacing, Guarding Pain Intervention(s): Monitored during session    Home Living Family/patient expects to be discharged to:: Private residence Living Arrangements: Other relatives Available Help at Discharge: Family;Available 24 hours/day Type of Home: House Home Access: Stairs to enter Entrance Stairs-Rails: None Entrance Stairs-Number of Steps: 2   Home Layout: Two level;Able to live on main level with bedroom/bathroom Home Equipment: Rollator (4 wheels);Shower seat;Grab bars - tub/shower Additional Comments: Shares RW with sister    Prior Function Prior Level of Function : Independent/Modified Independent             Mobility Comments: Pt ambulatory in home without AD at baseline; drives herself to HD ADLs Comments: pt participates in light cleaning, light meal prep, and was modified indep with all basic self care with the exception of sister helps to transfer pt into tub with shower seat.  Pt manages her own bathing while seated on shower chair.     Hand Dominance   Dominant Hand: Right    Extremity/Trunk Assessment   Upper Extremity Assessment Upper Extremity Assessment: Overall WFL for tasks assessed    Lower Extremity Assessment Lower Extremity Assessment: LLE deficits/detail LLE Deficits / Details: Grossly decr aROM and strength post TKA       Communication   Communication: No difficulties  Cognition Arousal/Alertness: Awake/alert Behavior During Therapy: WFL for tasks assessed/performed Overall Cognitive Status: Within Functional Limits for tasks assessed                                          General Comments General comments (skin integrity, edema, etc.): Pt reporting feeling  hot and dizzy during bout of amb; opted to sit; BP sitting with feet up 127/75, HR 68; will consider orhtostatic BPs next session    Exercises     Assessment/Plan    PT Assessment Patient needs continued PT services  PT Problem List Decreased strength;Decreased range of motion;Decreased activity tolerance;Decreased balance;Decreased mobility;Cardiopulmonary status limiting activity;Pain       PT Treatment Interventions DME instruction;Gait training;Stair training;Functional mobility training;Therapeutic activities;Therapeutic exercise;Balance training;Patient/family education    PT Goals (Current goals can be found in the Care Plan section)  Acute Rehab PT Goals Patient Stated Goal: Walk without pain PT Goal Formulation: With patient Time For Goal Achievement: 03/28/22 Potential to Achieve Goals: Good    Frequency 7X/week     Co-evaluation               AM-PAC PT "6 Clicks" Mobility  Outcome Measure Help needed turning from your back to your side while in a flat bed without using bedrails?: A Lot Help needed moving from lying on your back to sitting on the side of a flat bed without using bedrails?: A Little Help needed moving to and from a bed to a chair (including a wheelchair)?: A Little Help needed standing up from a chair using your  arms (e.g., wheelchair or bedside chair)?: A Little Help needed to walk in hospital room?: A Lot Help needed climbing 3-5 steps with a railing? : A Lot 6 Click Score: 15    End of Session Equipment Utilized During Treatment: Gait belt Activity Tolerance: Patient tolerated treatment well Patient left: in chair;with call bell/phone within reach;with family/visitor present Nurse Communication: Mobility status;Other (comment) (possibly orthostatic) PT Visit Diagnosis: Unsteadiness on feet (R26.81);Other abnormalities of gait and mobility (R26.89);Pain Pain - Right/Left: Left Pain - part of body: Knee    Time: 2505-3976 PT Time  Calculation (min) (ACUTE ONLY): 37 min   Charges:   PT Evaluation $PT Eval Moderate Complexity: 1 Mod PT Treatments $Gait Training: 8-22 mins        Roney Marion, PT  Acute Rehabilitation Services Office [phone]   [place] 03/14/2022, 11:05 AM

## 2022-03-14 NOTE — Consult Note (Signed)
[facility] Renal Consultation Note    Indication for Consultation:  Management of ESRD/hemodialysis; anemia, hypertension/volume and secondary hyperparathyroidism  HPI: Carrie [name] is a 71 y.o. female with a PMH significant for HTN, DM type 2, chronic diastolic CHF, DJD, and ESRD ([facility] M[facility]) who was admitted for left TKA by Dr. [name] on 03/13/22.  We were consulted to provide dialysis during her hospitalization.    She does have some left knee pain but otherwise without complaints.   Past Medical History:  Diagnosis Date   Allergy    Anemia    Arthritis    "right knee" (10/26'/2017)   Chronic heart failure with preserved ejection fraction (HFpEF) ([place])    a. 04/2021 Echo: EF >55%, GrI DD, nl RV fxn, triv MR; b. 12/2021 Echo: EF 60-65%, no rwma.   Demand ischemia    a. 04/2021 HsTrop up to 73 in setting of volume overload; b. 10/2021 MV: EF 66% with small, mild reversible defect in the apical septal and mid anteroseptal myocardium which was felt to most likely represent artifact.   Depression    ESRD (end stage renal disease) ([place])    a. OnHD (Dr. Lavonia Dana)   History of blood transfusion 2008   "when I had brain tumor surgery"   History of MRSA infection 2008   History of stress test    a. 07/2018 MV: EF 66%, no ischemia/infarct.   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 12/2021 Echo: EF 60-65%, no rwma, mild LVH, nl RV fxn, small pericardial effusion - small pocket of post wall, ~ 1cm.   Precordial chest pain    a. 10/2021 MV: low risk.   Type II diabetes mellitus ([place]) 2011   on metformin until yr ago- no med now - off due to kidneys   Past Surgical History:  Procedure Laterality Date   A/V FISTULAGRAM Left 03/10/2019   Procedure: A/V FISTULAGRAM;  Surgeon: Katha Cabal, MD;  Location: [facility];  Service: Cardiovascular;  Laterality: Left;   A/V FISTULAGRAM Left 07/14/2019    Procedure: A/V FISTULAGRAM;  Surgeon: Katha Cabal, MD;  Location: [place] [facility];  Service: Cardiovascular;  Laterality: Left;   A/V FISTULAGRAM Left 01/07/2020   Procedure: A/V FISTULAGRAM;  Surgeon: Katha Cabal, MD;  Location: [place] [facility];  Service: Cardiovascular;  Laterality: Left;   A/V FISTULAGRAM Left 11/14/2021   Procedure: A/V Fistulagram;  Surgeon: Katha Cabal, MD;  Location: [place] [facility];  Service: Cardiovascular;  Laterality: Left;   AV FISTULA PLACEMENT Left 11/07/2018   Procedure: ARTERIOVENOUS (AV) FISTULA CREATION ( BRACHIO BASILIC ) VS BRACHIAL AXILLARY GRAFT;  Surgeon: Katha Cabal, MD;  Location: [facility];  Service: Vascular;  Laterality: Left;   BRAIN MENINGIOMA EXCISION  08/2006   Foramina magnum meningioma   CERVICAL DISCECTOMY  1996   Dr. Alric Seton   CESAREAN SECTION  1981   COLONOSCOPY     COLONOSCOPY WITH PROPOFOL N/A 03/07/2021   Procedure: COLONOSCOPY WITH PROPOFOL;  Surgeon: Virgel Manifold, MD;  Location: [facility];  Service: Endoscopy;  Laterality: N/A;   DIALYSIS/PERMA CATHETER INSERTION N/A 08/04/2018   Procedure: DIALYSIS/PERMA CATHETER INSERTION;  Surgeon: Algernon Huxley, MD;  Location: [place] [facility];  Service: Cardiovascular;  Laterality: N/A;   DIALYSIS/PERMA CATHETER REMOVAL N/A 12/25/2018   Procedure: DIALYSIS/PERMA CATHETER REMOVAL;  Surgeon: Algernon Huxley, MD;  Location: [place] [facility];  Service: Cardiovascular;  Laterality: N/A;   DILATION AND CURETTAGE OF UTERUS     JOINT REPLACEMENT     right knee left hip   Right wrist x-ray  2007   Deg. at 1st MCP   TOTAL HIP ARTHROPLASTY Left 03/27/2016   Procedure: LEFT TOTAL HIP ARTHROPLASTY ANTERIOR APPROACH;  Surgeon: Mcarthur Rossetti, MD;  Location: [place];  Service: Orthopedics;  Laterality: Left;   TOTAL KNEE ARTHROPLASTY Right 04/30/2017   Procedure: RIGHT TOTAL KNEE ARTHROPLASTY;  Surgeon: Mcarthur Rossetti, MD;  Location: [place];   Service: Orthopedics;  Laterality: Right;   TOTAL KNEE ARTHROPLASTY Left 03/13/2022   Procedure: LEFT TOTAL KNEE ARTHROPLASTY;  Surgeon: Mcarthur Rossetti, MD;  Location: [place];  Service: Orthopedics;  Laterality: Left;   TUBAL LIGATION  1981   Family History:   Family History  Problem Relation Age of Onset   Hypertension Other        Strong family history    Breast cancer Mother 62   Heart failure Sister    Heart disease Sister    Social History:  reports that she has never smoked. She has been exposed to tobacco smoke. She has never used smokeless tobacco. She reports that she does not drink alcohol and does not use drugs. Allergies  Allergen Reactions   Naproxen Sodium Anaphylaxis and Swelling   Sulfamethoxazole-Trimethoprim Anaphylaxis and Swelling   Prior to Admission medications   Medication Sig Start Date End Date Taking? Authorizing Provider  acetaminophen (TYLENOL) 500 MG tablet Take 1,000 mg by mouth every 6 (six) hours as needed for mild pain or moderate pain.   Yes [provider]  amLODipine (NORVASC) 10 MG tablet Take 10 mg by mouth daily.   Yes [provider]  aspirin EC 81 MG tablet Take 81 mg by mouth daily.   Yes [provider]  atorvastatin (LIPITOR) 40 MG tablet TAKE 40 MG BY MOUTH ONCE DAILY Patient taking differently: Take 40 mg by mouth daily. 03/11/18  Yes Viviana Simpler I, MD  carvedilol (COREG) 12.5 MG tablet Take 12.5 mg by mouth 2 (two) times daily with a meal.   Yes [provider]  levETIRAcetam (KEPPRA) 1000 MG tablet TAKE 1 TABLET BY MOUTH TWICE A DAY 03/01/22  Yes Venia Carbon, MD  lidocaine-prilocaine (EMLA) cream Apply 1 application topically every Monday, Wednesday, and Friday with hemodialysis.   Yes [provider]  LORazepam (ATIVAN) 0.5 MG tablet TAKE 1 TABLET BY MOUTH TWICE A DAY AS NEEDED FOR ANXIETY 03/01/22  Yes Venia Carbon, MD  losartan (COZAAR) 100 MG tablet Take 100 mg by mouth  daily.   Yes [provider]  multivitamin (RENA-VIT) TABS tablet Take 1 tablet by mouth at bedtime. 08/07/18  Yes Wieting, Richard, MD  omeprazole (PRILOSEC) 20 MG capsule TAKE 1 CAPSULE BY MOUTH EVERY DAY 11/28/21  Yes Viviana Simpler I, MD  sevelamer carbonate (RENVELA) 800 MG tablet Take 800-1,600 mg by mouth See admin instructions. Take 2 tablets (1600 mg) by mouth three times daily with meals and take 1 tablet (800 mg) by mouth twice daily with snacks   Yes [provider]  torsemide (DEMADEX) 20 MG tablet Take 20 mg by mouth daily. Every day (including on dialysis days)   Yes [provider]  albuterol (VENTOLIN HFA) 108 (90 Base) MCG/ACT inhaler Inhale 2 puffs into the lungs every 6 (six) hours as needed for wheezing or shortness of breath.  05/05/21   Nicole Kindred A, DO  OXYGEN Inhale 2 L into the lungs as needed (Shortness of Breath).    [provider]   Current Facility-Administered Medications  Medication Dose Route Frequency Provider Last Rate Last Admin   0.9 %  sodium chloride infusion   Intravenous Continuous Mcarthur Rossetti, MD 75 mL/hr at 03/13/22 1825 New Bag at 03/13/22 1825   acetaminophen (TYLENOL) tablet 325-650 mg  325-650 mg Oral Q6H PRN Mcarthur Rossetti, MD       albuterol (PROVENTIL) (2.5 MG/3ML) 0.083% nebulizer solution 2.5 mg  2.5 mg Nebulization Q6H PRN Mcarthur Rossetti, MD       alum & mag hydroxide-simeth (MAALOX/MYLANTA) 200-200-20 MG/5ML suspension 30 mL  30 mL Oral Q4H PRN Mcarthur Rossetti, MD       amLODipine (NORVASC) tablet 10 mg  10 mg Oral Daily Mcarthur Rossetti, MD   10 mg at 03/14/22 6387   aspirin chewable tablet 81 mg  81 mg Oral BID Mcarthur Rossetti, MD   81 mg at 03/14/22 0808   atorvastatin (LIPITOR) tablet 40 mg  40 mg Oral Daily Mcarthur Rossetti, MD   40 mg at 03/14/22 0807   carvedilol (COREG) tablet 12.5 mg  12.5 mg Oral BID WC Mcarthur Rossetti, MD   12.5 mg  at 03/14/22 5643   Chlorhexidine Gluconate Cloth 2 % PADS 6 each  6 each Topical Q0600 Donato Heinz, MD   6 each at 03/14/22 1007   diphenhydrAMINE (BENADRYL) 12.5 MG/5ML elixir 12.5-25 mg  12.5-25 mg Oral Q4H PRN Mcarthur Rossetti, MD       docusate sodium (COLACE) capsule 100 mg  100 mg Oral BID Mcarthur Rossetti, MD   100 mg at 03/14/22 0808   HYDROmorphone (DILAUDID) 1 MG/ML injection            HYDROmorphone (DILAUDID) injection 0.5-1 mg  0.5-1 mg Intravenous Q4H PRN Mcarthur Rossetti, MD   1 mg at 03/14/22 1116   HYDROmorphone (DILAUDID) tablet 2-3 mg  2-3 mg Oral Q4H PRN Mcarthur Rossetti, MD   2 mg at 03/14/22 0454   levETIRAcetam (KEPPRA) tablet 1,000 mg  1,000 mg Oral BID Mcarthur Rossetti, MD   1,000 mg at 03/14/22 0809   lidocaine (PF) (XYLOCAINE) 1 % injection 5 mL  5 mL Intradermal PRN Donato Heinz, MD       lidocaine-prilocaine (EMLA) cream 1 Application  1 Application Topical Q M,W,F-HD Mcarthur Rossetti, MD       lidocaine-prilocaine (EMLA) cream 1 Application  1 Application Topical PRN Donato Heinz, MD       LORazepam (ATIVAN) tablet 0.5 mg  0.5 mg Oral BID PRN Mcarthur Rossetti, MD       losartan (COZAAR) tablet 100 mg  100 mg Oral Daily Mcarthur Rossetti, MD   100 mg at 03/14/22 3295   menthol-cetylpyridinium (CEPACOL) lozenge 3 mg  1 lozenge Oral PRN Mcarthur Rossetti, MD       Or   phenol (CHLORASEPTIC) mouth spray 1 spray  1 spray Mouth/Throat PRN Mcarthur Rossetti, MD       methocarbamol (ROBAXIN) tablet 500 mg  500 mg Oral Q6H PRN Mcarthur Rossetti, MD   500 mg at 03/13/22 1915   Or   methocarbamol (ROBAXIN) 500 mg in dextrose 5 % 50 mL IVPB  500 mg Intravenous Q6H PRN Mcarthur Rossetti, MD       metoCLOPramide [facility])  tablet 5-10 mg  5-10 mg Oral Q8H PRN Mcarthur Rossetti, MD       Or   metoCLOPramide (REGLAN) injection 5-10 mg  5-10 mg Intravenous Q8H PRN Mcarthur Rossetti, MD       multivitamin (RENA-VIT) tablet 1 tablet  1 tablet Oral QHS Mcarthur Rossetti, MD   1 tablet at 03/13/22 2122   ondansetron (ZOFRAN) tablet 4 mg  4 mg Oral Q6H PRN Mcarthur Rossetti, MD   4 mg at 03/14/22 0454   Or   ondansetron (ZOFRAN) injection 4 mg  4 mg Intravenous Q6H PRN Mcarthur Rossetti, MD   4 mg at 03/13/22 1932   oxyCODONE (Oxy IR/ROXICODONE) immediate release tablet 5-10 mg  5-10 mg Oral Q4H PRN Mcarthur Rossetti, MD   10 mg at 03/13/22 1915   pantoprazole (PROTONIX) EC tablet 40 mg  40 mg Oral Daily Mcarthur Rossetti, MD   40 mg at 03/14/22 3557   pentafluoroprop-tetrafluoroeth (GEBAUERS) aerosol 1 Application  1 Application Topical PRN Donato Heinz, MD       sevelamer carbonate (RENVELA) tablet 1,600 mg  1,600 mg Oral TID WC Mcarthur Rossetti, MD   1,600 mg at 03/14/22 0809   sevelamer carbonate (RENVELA) tablet 800 mg  800 mg Oral BID PRN Mcarthur Rossetti, MD       torsemide [facility]) tablet 20 mg  20 mg Oral Daily Mcarthur Rossetti, MD   20 mg at 03/14/22 3220   Labs: Basic Metabolic Panel: Recent Labs  Lab 03/13/22 1319 03/14/22 0046  NA 139 135  K 4.5 4.8  CL 101 99  CO2  --  23  GLUCOSE 85 204*  BUN 46* 42*  CREATININE 8.60* 8.50*  CALCIUM  --  8.4*   Liver Function Tests: No results for input(s): "AST", "ALT", "ALKPHOS", "BILITOT", "PROT", "ALBUMIN" in the last 168 hours. No results for input(s): "LIPASE", "AMYLASE" in the last 168 hours. No results for input(s): "AMMONIA" in the last 168 hours. CBC: Recent Labs  Lab 03/13/22 1319 03/14/22 0046  WBC  --  10.3  HGB 14.6 11.8*  HCT 43.0 36.2  MCV  --  94.3  PLT  --  178   Cardiac Enzymes: No results for input(s): "CKTOTAL", "CKMB", "CKMBINDEX", "TROPONINI" in the last 168 hours. CBG: Recent Labs  Lab 03/13/22 1235 03/13/22 1727  GLUCAP 82 120*   Iron Studies: No results for input(s): "IRON", "TIBC", "TRANSFERRIN",  "FERRITIN" in the last 72 hours. Studies/Results: DG Knee Left Port  Result Date: 03/13/2022 CLINICAL DATA:  Status post total left knee arthroplasty. EXAM: PORTABLE LEFT KNEE - 1-2 VIEW COMPARISON:  Left knee radiographs 08/22/2020 FINDINGS: Interval total left knee arthroplasty. No perihardware lucency is seen to indicate hardware failure or loosening. Expected postoperative changes including anterior and lateral subcutaneous air. Small joint effusion with mild intra-articular air. Anterior surgical skin staples. No acute fracture or dislocation. IMPRESSION: Interval total left knee arthroplasty without evidence of hardware failure. Electronically Signed   By: Yvonne Kendall M.D.   On: 03/13/2022 17:36    ROS: A comprehensive review of systems was negative except for: Musculoskeletal: positive for left knee pain Physical Exam: Vitals:   03/14/22 0700 03/14/22 1120 03/14/22 1125 03/14/22 1130  BP: 122/69 136/70 130/70 128/69  Pulse: 78 80 80 75  Resp: '19 20 18 15  '$ Temp: 98.1 F (36.7 C) 98 F (36.7 C)    TempSrc: Oral     SpO2: 96% 98%  Weight:      Height:          Weight change:   Intake/Output Summary (Last 24 hours) at 03/14/2022 1204 Last data filed at 03/13/2022 1650 Gross per 24 hour  Intake 400 ml  Output 50 ml  Net 350 ml   BP 128/69   Pulse 75   Temp 98 F (36.7 C)   Resp 15   Ht '5\' 2"'$  (1.575 m)   Wt 97.5 kg   SpO2 98%   BMI 39.32 kg/m  General appearance: alert, cooperative, and no distress Head: Normocephalic, without obvious abnormality, atraumatic Resp: clear to auscultation bilaterally Cardio: regular rate and rhythm, S1, S2 normal, no murmur, click, rub or gallop GI: soft, non-tender; bowel sounds normal; no masses,  no organomegaly Extremities: s/p left knee surgery with vertical bandage over knee, LUE AVG +T/B Dialysis Access:  Dialysis Orders: Center: [facility]  on MWF . EDW 99kg HD [place] 2K/2.5Ca  Time 3:30 Heparin none. Access LUE AVG  BFR 400 DFR 500      Assessment/Plan:  End stage arthritis of left knee - s/p left TKA on 03/13/22.  Doing well.  ESRD -  plan for HD on MWF schedule  Hypertension/volume  -  stable  Anemia  - stable, no ESA  Metabolic bone disease -   continue with home meds  Nutrition - renal diet, carb modified.  Donetta Potts, MD [place] 03/14/2022, 12:04 PM

## 2022-03-14 NOTE — TOC Initial Note (Signed)
Transition of Care [facility]) - Initial/Assessment Note    Patient Details  Name: Carrie [name] MRN: [id number] Date of Birth: Oct 22, 1950  Transition of Care [facility]) CM/SW Contact:    Joanne Chars, LCSW Phone Number: 03/14/2022, 4:23 PM  Clinical Narrative:   CSW met with pt for initial assessment and to discuss DC plan.  Pt lives at home with two sisters and two nieces, permission given to speak to sisters.  Pt reports that she does not want to pursue SNF and is planning to return home.  Pt reports she has current [facility] services, thinks it is [employer].  It was unclear if these services were ending--pt mentioned [facility] talking about setting her up for outpt PT.  Pt would be open to continue with [facility] services at DC.                Expected Discharge Plan: [place] Barriers to Discharge: Continued Medical Work up   Patient Goals and CMS Choice Patient states their goals for this hospitalization and ongoing recovery are:: be able to do all her usual things, like driving      Expected Discharge Plan and Services Expected Discharge Plan: [place] In-house Referral: Clinical Social Work   Post Acute Care Choice: [place] arrangements for the past 2 months: [place]                                      Prior Living Arrangements/Services Living arrangements for the past 2 months: Single Family Home Lives with:: Relatives (2 sisters, 2 nieces) Patient language and need for interpreter reviewed:: Yes Do you feel safe going back to the place where you live?: Yes      Need for Family Participation in Patient Care: Yes (Comment) Care giver support system in place?: Yes (comment) Current home services: Home PT [facility]?) Criminal Activity/Legal Involvement Pertinent to Current Situation/Hospitalization: No - Comment as needed  Activities of Daily Living Home Assistive Devices/Equipment: Eyeglasses, Environmental consultant (specify  type), Wheelchair, Bedside commode/3-in-1 ADL Screening (condition at time of admission) Patient's cognitive ability adequate to safely complete daily activities?: Yes Is the patient deaf or have difficulty hearing?: No Does the patient have difficulty seeing, even when wearing glasses/contacts?: No Does the patient have difficulty concentrating, remembering, or making decisions?: No Patient able to express need for assistance with ADLs?: Yes Does the patient have difficulty dressing or bathing?: No Independently performs ADLs?: Yes (appropriate for developmental age) Does the patient have difficulty walking or climbing stairs?: Yes Weakness of Legs: Left Weakness of Arms/Hands: None  Permission Sought/Granted Permission sought to share information with : Family Supports Permission granted to share information with : Yes, Verbal Permission Granted  Share Information with NAME: sisters Langley Gauss and Deedee           Emotional Assessment Appearance:: Appears stated age Attitude/Demeanor/Rapport: Engaged Affect (typically observed): Appropriate, Pleasant Orientation: : Oriented to Self, Oriented to Place, Oriented to  Time, Oriented to Situation      Admission diagnosis:  OA (osteoarthritis) of knee [M17.9] Status post total left knee replacement [Z96.652] Patient Active Problem List   Diagnosis Date Noted   Arthritis of left knee 03/13/2022   OA (osteoarthritis) of knee 03/13/2022   Status post total left knee replacement 03/13/2022   Chronic diastolic heart failure ([place]) 05/17/2021   Seizure disorder ([place])  Hyperlipidemia    SOB (shortness of breath) 05/01/2021   Anemia 05/01/2021   Positive FIT (fecal immunochemical test)    Polyp of ascending colon    Polyp of descending colon    Polyp of transverse colon    Dysphagia 02/07/2021   Tremor 12/01/2020   Headache 12/01/2020   Eustachian tube dysfunction 10/25/2020   History of shingles 09/26/2020   Primary osteoarthritis  of left knee 36/11/7701   Complication from renal dialysis device 11/20/2018   ESRD on hemodialysis ([place]) 08/04/2018   Status post total right knee replacement 04/30/2017   History of total hip replacement, left 03/18/2017   Status post total replacement of left hip 03/27/2016   Mood disorder ([place]) 04/01/2015   Osteoarthritis of right knee 02/03/2014   Advanced directives, counseling/discussion 02/03/2014   Routine general medical examination at a health care facility 07/08/2012   Morbid obesity ([place]) 07/08/2012   Otitis externa 03/20/2010   HYPERCHOLESTEROLEMIA 09/26/2006   Essential hypertension 09/26/2006   ALLERGIC RHINITIS 09/26/2006   History of meningioma 08/03/2006   PCP:  Venia Carbon, MD Pharmacy:   [place], [place] - [address] [place] [address] Phone: [phone] Fax: [phone]  [id number] - [place], [place] - [address]: [phone]: [phone] [id number] [place] [address] [id number]: [phone]: [phone]   Social Determinants of Health (SDOH) Interventions    Readmission Risk Interventions     No data to display

## 2022-03-14 NOTE — Progress Notes (Signed)
Subjective: 1 Day Post-Op Procedure(s) (LRB): LEFT TOTAL KNEE ARTHROPLASTY (Left) Patient reports pain as moderate.  Reports some nausea last night.   Objective: Vital signs in last 24 hours: Temp:  [97.7 F (36.5 C)-98.1 F (36.7 C)] 98.1 F (36.7 C) (10/11 0700) Pulse Rate:  [56-80] 78 (10/11 0700) Resp:  [12-20] 19 (10/11 0700) BP: (83-175)/(53-82) 122/69 (10/11 0700) SpO2:  [84 %-100 %] 96 % (10/11 0700) Weight:  [97.5 kg] 97.5 kg (10/10 1234)  Intake/Output from previous day: 10/10 0701 - 10/11 0700 In: 400 [I.V.:400] Out: 50 [Blood:50] Intake/Output this shift: No intake/output data recorded.  Recent Labs    03/13/22 1319 03/14/22 0046  HGB 14.6 11.8*   Recent Labs    03/13/22 1319 03/14/22 0046  WBC  --  10.3  RBC  --  3.84*  HCT 43.0 36.2  PLT  --  178   Recent Labs    03/13/22 1319 03/14/22 0046  NA 139 135  K 4.5 4.8  CL 101 99  CO2  --  23  BUN 46* 42*  CREATININE 8.60* 8.50*  GLUCOSE 85 204*  CALCIUM  --  8.4*   No results for input(s): "LABPT", "INR" in the last 72 hours.  General : Awake and alert. No acute distress.   Left lower extremity: Dorsiflexion/Plantar flexion intact Incision: scant drainage Compartment soft   Assessment/Plan: 1 Day Post-Op Procedure(s) (LRB): LEFT TOTAL KNEE ARTHROPLASTY (Left) Up with therapy Dialysis Monday, Wednesday and Fridays. Renal consulted by Dr. [name] yesterday.  Dressing changed.     Carrie [name] 03/14/2022, 7:39 AM

## 2022-03-14 NOTE — Care Management Obs Status (Signed)
[place] NOTIFICATION   Patient Details  Name: Carrie [name] MRN: [id number] Date of Birth: 1950/08/22   Medicare Observation Status Notification Given:  Yes    Joanne Chars, LCSW 03/14/2022, 4:12 PM

## 2022-03-14 NOTE — Progress Notes (Signed)
PT Cancellation Note  Patient Details Name: TREY BEBEE MRN: [id number] DOB: 28-Apr-1951   Cancelled Treatment:    Reason Eval/Treat Not Completed: Other (comment)  Currently in HD;   Will follow up later today as time allows;  Otherwise, will follow up for PT tomorrow;   Thank you,  Roney Marion, [place] Office [phone]  Colletta Maryland 03/14/2022, 12:37 PM

## 2022-03-14 NOTE — Discharge Instructions (Signed)

## 2022-03-14 NOTE — Plan of Care (Signed)

## 2022-03-14 NOTE — Procedures (Signed)
I was present at this dialysis session. I have reviewed the session itself and made appropriate changes.   Vital signs in last 24 hours:  Temp:  [97.7 F (36.5 C)-98.1 F (36.7 C)] 98 F (36.7 C) (10/11 1120) Pulse Rate:  [56-80] 75 (10/11 1400) Resp:  [12-20] 13 (10/11 1400) BP: (83-147)/(53-81) 107/55 (10/11 1400) SpO2:  [84 %-100 %] 99 % (10/11 1400) Weight change:  Filed Weights   03/13/22 1234  Weight: 97.5 kg    Recent Labs  Lab 03/14/22 0046  NA 135  K 4.8  CL 99  CO2 23  GLUCOSE 204*  BUN 42*  CREATININE 8.50*  CALCIUM 8.4*    Recent Labs  Lab 03/13/22 1319 03/14/22 0046  WBC  --  10.3  HGB 14.6 11.8*  HCT 43.0 36.2  MCV  --  94.3  PLT  --  178    Scheduled Meds:  amLODipine  10 mg Oral Daily   aspirin  81 mg Oral BID   atorvastatin  40 mg Oral Daily   carvedilol  12.5 mg Oral BID WC   Chlorhexidine Gluconate Cloth  6 each Topical Q0600   docusate sodium  100 mg Oral BID   HYDROmorphone       levETIRAcetam  1,000 mg Oral BID   lidocaine-prilocaine  1 Application Topical Q M,W,F-HD   losartan  100 mg Oral Daily   multivitamin  1 tablet Oral QHS   pantoprazole  40 mg Oral Daily   sevelamer carbonate  1,600 mg Oral TID WC   torsemide  20 mg Oral Daily   Continuous Infusions:  sodium chloride 75 mL/hr at 03/13/22 1825   methocarbamol (ROBAXIN) IV     PRN Meds:.acetaminophen, albuterol, alum & mag hydroxide-simeth, diphenhydrAMINE, HYDROmorphone, HYDROmorphone (DILAUDID) injection, HYDROmorphone, lidocaine (PF), lidocaine-prilocaine, LORazepam, menthol-cetylpyridinium **OR** phenol, methocarbamol **OR** methocarbamol (ROBAXIN) IV, metoCLOPramide **OR** metoCLOPramide (REGLAN) injection, ondansetron **OR** ondansetron (ZOFRAN) IV, oxyCODONE, pentafluoroprop-tetrafluoroeth, sevelamer carbonate   Donetta Potts,  MD 03/14/2022, 2:13 PM

## 2022-03-14 NOTE — Progress Notes (Signed)
Received patient in bed to unit.  Alert and oriented.  Informed consent signed and in chart.   Treatment initiated: 1120 Treatment completed: 1450  Patient tolerated well.  Transported back to the room  Alert, without acute distress.  Hand-off given to patient's nurse.   Access used: AVG Access issues: none  Total UF removed: 1.2 L Medication(s) given: Dilaudid 1 mg IV. Dilaudid 3 mg PO Post HD VS: 127/76 P 90 R 20 Post HD weight: 96.4 kg   [facility]

## 2022-03-15 DIAGNOSIS — G40909 Epilepsy, unspecified, not intractable, without status epilepticus: Secondary | ICD-10-CM | POA: Diagnosis present

## 2022-03-15 DIAGNOSIS — I132 Hypertensive heart and chronic kidney disease with heart failure and with stage 5 chronic kidney disease, or end stage renal disease: Secondary | ICD-10-CM | POA: Diagnosis present

## 2022-03-15 DIAGNOSIS — Z6838 Body mass index (BMI) 38.0-38.9, adult: Secondary | ICD-10-CM | POA: Diagnosis not present

## 2022-03-15 DIAGNOSIS — N2581 Secondary hyperparathyroidism of renal origin: Secondary | ICD-10-CM | POA: Diagnosis present

## 2022-03-15 DIAGNOSIS — N186 End stage renal disease: Secondary | ICD-10-CM | POA: Diagnosis not present

## 2022-03-15 DIAGNOSIS — M1712 Unilateral primary osteoarthritis, left knee: Secondary | ICD-10-CM | POA: Diagnosis present

## 2022-03-15 DIAGNOSIS — Z992 Dependence on renal dialysis: Secondary | ICD-10-CM | POA: Diagnosis not present

## 2022-03-15 DIAGNOSIS — Z8249 Family history of ischemic heart disease and other diseases of the circulatory system: Secondary | ICD-10-CM | POA: Diagnosis not present

## 2022-03-15 DIAGNOSIS — F32A Depression, unspecified: Secondary | ICD-10-CM | POA: Diagnosis present

## 2022-03-15 DIAGNOSIS — M898X9 Other specified disorders of bone, unspecified site: Secondary | ICD-10-CM | POA: Diagnosis present

## 2022-03-15 DIAGNOSIS — Z882 Allergy status to sulfonamides status: Secondary | ICD-10-CM | POA: Diagnosis not present

## 2022-03-15 DIAGNOSIS — M659 Synovitis and tenosynovitis, unspecified: Secondary | ICD-10-CM | POA: Diagnosis present

## 2022-03-15 DIAGNOSIS — I5032 Chronic diastolic (congestive) heart failure: Secondary | ICD-10-CM | POA: Diagnosis present

## 2022-03-15 DIAGNOSIS — M179 Osteoarthritis of knee, unspecified: Secondary | ICD-10-CM | POA: Diagnosis present

## 2022-03-15 DIAGNOSIS — Z888 Allergy status to other drugs, medicaments and biological substances status: Secondary | ICD-10-CM | POA: Diagnosis not present

## 2022-03-15 DIAGNOSIS — Z96651 Presence of right artificial knee joint: Secondary | ICD-10-CM | POA: Diagnosis present

## 2022-03-15 DIAGNOSIS — Z7982 Long term (current) use of aspirin: Secondary | ICD-10-CM | POA: Diagnosis not present

## 2022-03-15 DIAGNOSIS — E1122 Type 2 diabetes mellitus with diabetic chronic kidney disease: Secondary | ICD-10-CM | POA: Diagnosis present

## 2022-03-15 DIAGNOSIS — E7800 Pure hypercholesterolemia, unspecified: Secondary | ICD-10-CM | POA: Diagnosis present

## 2022-03-15 DIAGNOSIS — Z79899 Other long term (current) drug therapy: Secondary | ICD-10-CM | POA: Diagnosis not present

## 2022-03-15 DIAGNOSIS — Z96642 Presence of left artificial hip joint: Secondary | ICD-10-CM | POA: Diagnosis present

## 2022-03-15 LAB — HEPATITIS B SURFACE ANTIBODY, QUANTITATIVE: Hep B S AB Quant (Post): 22.3 m[IU]/mL (ref >9.9)

## 2022-03-15 LAB — HEPATITIS B CORE ANTIBODY, TOTAL

## 2022-03-15 LAB — HEPATITIS B SURFACE ANTIBODY,QUALITATIVE

## 2022-03-15 LAB — HEPATITIS C ANTIBODY: HCV Ab: NONREACTIVE — AB

## 2022-03-15 NOTE — Progress Notes (Signed)
Subjective: 2 Days Post-Op Procedure(s) (LRB): LEFT TOTAL KNEE ARTHROPLASTY (Left) Patient reports pain as moderate.  Reports nausea yesterday. Also was dizzy after dialysis yesterday. States she feels better today. Denies chest pain. Nasal canula with oxygen sat of 98 Percent.    Objective: Vital signs in last 24 hours: Temp:  [98 F (36.7 C)-99.6 F (37.6 C)] 98.9 F (37.2 C) (10/12 0748) Pulse Rate:  [75-95] 93 (10/12 0748) Resp:  [13-20] 16 (10/12 0748) BP: (100-136)/(51-76) 106/63 (10/12 0748) SpO2:  [74 %-99 %] 98 % (10/12 0748) Weight:  [96.4 kg] 96.4 kg (10/11 1541)  Intake/Output from previous day: 10/11 0701 - 10/12 0700 In: 269.6 [I.V.:269.6] Out: 1200  Intake/Output this shift: No intake/output data recorded.  Recent Labs    03/13/22 1319 03/14/22 0046  HGB 14.6 11.8*   Recent Labs    03/13/22 1319 03/14/22 0046  WBC  --  10.3  RBC  --  3.84*  HCT 43.0 36.2  PLT  --  178   Recent Labs    03/13/22 1319 03/14/22 0046  NA 139 135  K 4.5 4.8  CL 101 99  CO2  --  23  BUN 46* 42*  CREATININE 8.60* 8.50*  GLUCOSE 85 204*  CALCIUM  --  8.4*   No results for input(s): "LABPT", "INR" in the last 72 hours.  Left lower extremity: Intact pulses distally Dorsiflexion/Plantar flexion intact Incision: dressing C/D/I Compartment soft   Assessment/Plan: 2 Days Post-Op Procedure(s) (LRB): LEFT TOTAL KNEE ARTHROPLASTY (Left) Up with therapy: Slow progress, possible discharge over next 2 days.  Incentive spirometry every hour when awake  Dialysis tomorrow.  Anticipated LOS equal to or greater than 2 midnights due to - Age 73 and older with one or more of the following:  - Obesity  - Expected need for hospital services (PT, OT, Nursing) required for safe  discharge  - Anticipated need for postoperative skilled nursing care or inpatient rehab  - Active co-morbidities: Heart Failure and end stage renal disease on dialysis  OR   - Unanticipated findings  during/Post Surgery: Slow post-op progression: GI, pain control, mobility  - high risk readmission due to generalized weakness post op    Jacquelyn Antony 03/15/2022, 9:24 AM

## 2022-03-15 NOTE — Progress Notes (Signed)
Physical Therapy Treatment Patient Details Name: Carrie [name] MRN: [id number] DOB: Mar 01, 1951 Today's Date: 03/15/2022   History of Present Illness Pt is 71 yo female Admitted for L TKA on 10/10 with WBAT.  Pt  has a past medical history of Allergy, Anemia, Arthritis, Chronic heart failure with preserved ejection fraction ), Demand ischemia, Depression, ESRD, Hypertension, Meningioma, Pericardial effusion, Precordial chest pain, and Type II diabetes mellitus    PT Comments    Pt seen for PT tx with pt agreeable & motivated to participate in preparation for d/c home with family assistance. Pt is able to complete supine>sit with min assist ot move LLE to EOB but mod/max assist to fully scoot to EOB with cuing for use of bed rails, HOB elevated, & extra time. Pt is limited by orthostatic hypotension during session, even when semi fowler in bed, thus limiting bed level exercises during this session too. RN present during supine<>sit to observe BP during session. Due to pt's slow progress with functional mobility, have updated d/c recommendations to SNF as pt is unsafe to d/c home with family assistance & unable to negotiate stairs into home at this time. Will continue to follow pt acutely to address L knee ROM, LLE strengthening, and bed mobility, transfers & gait as able.  Pt received on 2L/min via nasal cannula, SpO2 >90%. Pt placed on room air & able to maintain SpO2 >90% until in trendelenburg position, even able to recover to >/= 90% in semi fowler position with cuing for pursed lip breathing. Due to pt requiring sustained trendelenburg position, SpO2 dropped as low as 81% & pt placed back & left on 2L/min with SpO2 >/=90% while on supplemental O2.  BP checked in RUE: Semi fowler in bed: 86/47 mmHg (MAP 60) Sitting EOB (with clearance to attempt from nurse): 75/57 mmHg (MAP 64) Trendelenburg: 110/62 mmHg (MAP 77) Semi fowler in bed, attempting LLE exercises: 74/40 mmHg (MAP 51) Pt returned  to trendelenburg position then completely supine Supine: 95/52 mmHg (MAP 64) Pt returned to trendelenburg to promote further improvement in BP. Pt left in care of nurse.    Recommendations for follow up therapy are one component of a multi-disciplinary discharge planning process, led by the attending physician.  Recommendations may be updated based on patient status, additional functional criteria and insurance authorization.  Follow Up Recommendations  Skilled nursing-short term rehab (<3 hours/day) Can patient physically be transported by private vehicle: No   Assistance Recommended at Discharge Frequent or constant Supervision/Assistance  Patient can return home with the following A lot of help with walking and/or transfers;A lot of help with bathing/dressing/bathroom;Assistance with cooking/housework;Help with stairs or ramp for entrance;Assist for transportation   Equipment Recommendations  Rolling walker (2 wheels)    Recommendations for Other Services       Precautions / Restrictions Precautions Precautions: Knee;Fall Precaution Comments: Orthostatic hypotension Restrictions Weight Bearing Restrictions: Yes LLE Weight Bearing: Weight bearing as tolerated     Mobility  Bed Mobility Overal bed mobility: Needs Assistance Bed Mobility: Supine to Sit     Supine to sit: Mod assist, HOB elevated (assistance to move LLE to EOB with min assist, mod/max assist to fully scoot buttocks to EOB, cuing to use bed rails, extra tiem) Sit to supine: Max assist, +2 for physical assistance (2/2 low BP)        Transfers                        Ambulation/Gait  Stairs             Wheelchair Mobility    Modified Rankin (Stroke Patients Only)       Balance Overall balance assessment: Needs assistance Sitting-balance support: Bilateral upper extremity supported, Feet supported Sitting balance-Leahy Scale: Fair Sitting balance -  Comments: supervision static sitting                                    Cognition Arousal/Alertness: Awake/alert Behavior During Therapy: WFL for tasks assessed/performed Overall Cognitive Status: Within Functional Limits for tasks assessed                                 General Comments: Pleasant lady, motivated to participate & get better        Exercises Total Joint Exercises Quad Sets: AROM, Strengthening, Left, 10 reps, Supine    General Comments        Pertinent Vitals/Pain Pain Assessment Pain Assessment: Faces Faces Pain Scale: Hurts even more Pain Location: L knee Pain Descriptors / Indicators: Aching, Grimacing, Guarding Pain Intervention(s): Premedicated before session, Limited activity within patient's tolerance, Monitored during session    Home Living                          Prior Function            PT Goals (current goals can now be found in the care plan section) Acute Rehab PT Goals Patient Stated Goal: Walk without pain PT Goal Formulation: With patient Time For Goal Achievement: 03/28/22 Potential to Achieve Goals: Good Progress towards PT goals: Not progressing toward goals - comment (limited by low BP)    Frequency    7X/week      PT Plan Discharge plan needs to be updated    Co-evaluation              AM-PAC PT "6 Clicks" Mobility   Outcome Measure  Help needed turning from your back to your side while in a flat bed without using bedrails?: A Lot Help needed moving from lying on your back to sitting on the side of a flat bed without using bedrails?: Total Help needed moving to and from a bed to a chair (including a wheelchair)?: Total Help needed standing up from a chair using your arms (e.g., wheelchair or bedside chair)?: Total Help needed to walk in hospital room?: Total Help needed climbing 3-5 steps with a railing? : Total 6 Click Score: 7    End of Session   Activity  Tolerance: Treatment limited secondary to medical complications (Comment) Patient left: in bed;with nursing/sitter in room Nurse Communication:  (BP) PT Visit Diagnosis: Unsteadiness on feet (R26.81);Other abnormalities of gait and mobility (R26.89);Pain Pain - Right/Left: Left Pain - part of body: Knee     Time: 2130-8657 PT Time Calculation (min) (ACUTE ONLY): 30 min  Charges:  $Therapeutic Activity: 23-37 mins                     Lavone Nian, PT, DPT 03/15/22, 10:37 AM   Waunita Schooner 03/15/2022, 10:31 AM

## 2022-03-15 NOTE — Progress Notes (Signed)
Patient ID: Carrie [name], female   DOB: 09/15/1950, 71 y.o.   MRN: [id number] S: Feeling better but still with knee pain. O:BP 106/63 (BP Location: Right Arm)   Pulse 93   Temp 98.9 F (37.2 C) (Oral)   Resp 16   Ht '5\' 2"'$  (1.575 m)   Wt 96.4 kg   SpO2 98%   BMI 38.87 kg/m   Intake/Output Summary (Last 24 hours) at 03/15/2022 1045 Last data filed at 03/14/2022 1700 Gross per 24 hour  Intake 269.57 ml  Output 1200 ml  Net -930.43 ml   Intake/Output: I/O last 3 completed shifts: In: 269.6 [I.V.:269.6] Out: 1200 [Other:1200]  Intake/Output this shift:  No intake/output data recorded. Weight change: -1.123 kg Gen: NAD CVS: RRR Resp:CTA Abd: +BS, soft, NT/ND Ext: no edema, LUE AVG +T/B  Recent Labs  Lab 03/13/22 1319 03/14/22 0046  NA 139 135  K 4.5 4.8  CL 101 99  CO2  --  23  GLUCOSE 85 204*  BUN 46* 42*  CREATININE 8.60* 8.50*  CALCIUM  --  8.4*   Liver Function Tests: No results for input(s): "AST", "ALT", "ALKPHOS", "BILITOT", "PROT", "ALBUMIN" in the last 168 hours. No results for input(s): "LIPASE", "AMYLASE" in the last 168 hours. No results for input(s): "AMMONIA" in the last 168 hours. CBC: Recent Labs  Lab 03/13/22 1319 03/14/22 0046  WBC  --  10.3  HGB 14.6 11.8*  HCT 43.0 36.2  MCV  --  94.3  PLT  --  178   Cardiac Enzymes: No results for input(s): "CKTOTAL", "CKMB", "CKMBINDEX", "TROPONINI" in the last 168 hours. CBG: Recent Labs  Lab 03/13/22 1235 03/13/22 1727 03/14/22 1632  GLUCAP 82 120* 140*    Iron Studies: No results for input(s): "IRON", "TIBC", "TRANSFERRIN", "FERRITIN" in the last 72 hours. Studies/Results: DG Knee Left Port  Result Date: 03/13/2022 CLINICAL DATA:  Status post total left knee arthroplasty. EXAM: PORTABLE LEFT KNEE - 1-2 VIEW COMPARISON:  Left knee radiographs 08/22/2020 FINDINGS: Interval total left knee arthroplasty. No perihardware lucency is seen to indicate hardware failure or loosening. Expected  postoperative changes including anterior and lateral subcutaneous air. Small joint effusion with mild intra-articular air. Anterior surgical skin staples. No acute fracture or dislocation. IMPRESSION: Interval total left knee arthroplasty without evidence of hardware failure. Electronically Signed   By: Yvonne Kendall M.D.   On: 03/13/2022 17:36    amLODipine  10 mg Oral Daily   aspirin  81 mg Oral BID   atorvastatin  40 mg Oral Daily   carvedilol  12.5 mg Oral BID WC   Chlorhexidine Gluconate Cloth  6 each Topical Q0600   docusate sodium  100 mg Oral BID   levETIRAcetam  1,000 mg Oral BID   lidocaine-prilocaine  1 Application Topical Q M,W,F-HD   losartan  100 mg Oral Daily   multivitamin  1 tablet Oral QHS   pantoprazole  40 mg Oral Daily   sevelamer carbonate  1,600 mg Oral TID WC   torsemide  20 mg Oral Daily    BMET    Component Value Date/Time   NA 135 03/14/2022 0046   K 4.8 03/14/2022 0046   CL 99 03/14/2022 0046   CO2 23 03/14/2022 0046   GLUCOSE 204 (H) 03/14/2022 0046   BUN 42 (H) 03/14/2022 0046   BUN 36 (A) 07/08/2017 0000   CREATININE 8.50 (H) 03/14/2022 0046   CALCIUM 8.4 (L) 03/14/2022 0046   GFRNONAA 5 (L) 03/14/2022 2563  GFRAA 9 (L) 11/04/2018 1208   CBC    Component Value Date/Time   WBC 10.3 03/14/2022 0046   RBC 3.84 (L) 03/14/2022 0046   HGB 11.8 (L) 03/14/2022 0046   HCT 36.2 03/14/2022 0046   PLT 178 03/14/2022 0046   MCV 94.3 03/14/2022 0046   MCH 30.7 03/14/2022 0046   MCHC 32.6 03/14/2022 0046   RDW 14.8 03/14/2022 0046   LYMPHSABS 1.2 05/01/2021 1043   MONOABS 0.4 05/01/2021 1043   EOSABS 0.1 05/01/2021 1043   BASOSABS 0.0 05/01/2021 1043    Dialysis Orders: Center: [facility]  on MWF . EDW 99kg HD Bath 2K/2.5Ca  Time 3:30 Heparin none. 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  Doing well.  ESRD -  plan for HD on MWF schedule  Hypertension/volume  -   stable but below edw and will need to be changed at discharge.  Anemia  - stable, no ESA  Metabolic bone disease -   continue with home meds  Nutrition - renal diet, carb modified.  Donetta Potts, MD [facility]

## 2022-03-15 NOTE — Progress Notes (Signed)
Pt receives out-pt HD at [facility] in [place] on MWF. Pt has a 5:45 am chair time. Clinic states they could treat pt Saturday at 8:00 am should pt be stable for d/c on Friday to avoid HD on day of d/c. Pt told clinic she would likely not resume until Monday. Will at clinic advised that unsure of d/c date at this time. Will assist as needed.   Melven Sartorius Renal Navigator [phone]

## 2022-03-15 NOTE — Progress Notes (Signed)
Physical Therapy Treatment Patient Details Name: Carrie [name] MRN: [id number] DOB: 1950/08/29 Today's Date: 03/15/2022   History of Present Illness Pt is 71 yo female Admitted for L TKA on 10/10 with WBAT.  Pt  has a past medical history of Allergy, Anemia, Arthritis, Chronic heart failure with preserved ejection fraction ), Demand ischemia, Depression, ESRD, Hypertension, Meningioma, Pericardial effusion, Precordial chest pain, and Type II diabetes mellitus    PT Comments    Pt seen for PT tx with pt reporting fatigue but motivated to participate. Pt requires max assist for supine>sit with bed rails & HOB flat or elevated. Pt requires assistance to move LLE to EOB & to upright trunk. Pt attempts STS but has to return to sitting 2/2 L knee pain then endorsing slight dizziness to returned to supine for safety. After rest, pt agreeable to attempt again & pt was able to transfer STS from EOB with max assist & complete step pivot bed>recliner on R with min assist. Pt is able to weight shift to L to step with R foot but unable to clear L foot from floor to take step. Pt engaged in LLE strengthening exercises with cuing for technique. PT educated pt on updated d/c recommendation of STR but pt still wishing to d/c home, reporting her sisters can assist her, although last time she was doing much better & not undergoing dialysis. Will continue to follow pt acutely to address L knee ROM & strengthening, transfers, and gait with LRAD.  Pt on 2L/min via nasal cannula with SpO2 >90%  BP checked in RUE: Semi fowler in bed: 108/55 mmHg MAP 72 Sitting EOB: 108/64 mmHg MAP 70 Returned supine after 1st standing attempt: 122/57 mmHg MAP 77 After transferring to recliner: 105/53 mmHg MAP 65 Sitting in recliner at end of session: 110/88 mmHg MAP 98    Recommendations for follow up therapy are one component of a multi-disciplinary discharge planning process, led by the attending physician.  Recommendations may  be updated based on patient status, additional functional criteria and insurance authorization.  Follow Up Recommendations  Skilled nursing-short term rehab (<3 hours/day) Can patient physically be transported by private vehicle: No   Assistance Recommended at Discharge Frequent or constant Supervision/Assistance  Patient can return home with the following A lot of help with walking and/or transfers;A lot of help with bathing/dressing/bathroom;Assistance with cooking/housework;Help with stairs or ramp for entrance;Assist for transportation   Equipment Recommendations  Rolling walker (2 wheels)    Recommendations for Other Services       Precautions / Restrictions Precautions Precautions: Knee;Fall Precaution Comments: Orthostatic hypotension Restrictions Weight Bearing Restrictions: Yes LLE Weight Bearing: Weight bearing as tolerated     Mobility  Bed Mobility Overal bed mobility: Needs Assistance Bed Mobility: Supine to Sit     Supine to sit: Max assist, HOB elevated Sit to supine: Max assist, HOB elevated   General bed mobility comments: Pt requires max assist for supine<>sit with or without HOB elevated, with use of bed rails. PT provides assistance to move LLE to EOB & to upright trunk. Pt initially requires assistance to scoot to EOB but then pt able to participate.    Transfers Overall transfer level: Needs assistance Equipment used: Rolling walker (2 wheels) Transfers: Sit to/from Stand, Bed to chair/wheelchair/BSC Sit to Stand: Max assist (cuing to scoot out to edge of seat, hand placement, & anterior weight shifting)   Step pivot transfers: Min assist (Pt completes step pivot bed>recliner on R with RW & min  assist. Pt is able to weight shift to L to step with RLE but unable to clear L foot from floor to take steps.)            Ambulation/Gait                   Stairs             Wheelchair Mobility    Modified Rankin (Stroke Patients  Only)       Balance Overall balance assessment: Needs assistance Sitting-balance support: Bilateral upper extremity supported, Feet supported Sitting balance-Leahy Scale: Fair Sitting balance - Comments: supervision static sitting   Standing balance support: Bilateral upper extremity supported, During functional activity, Reliant on assistive device for balance Standing balance-Leahy Scale: Poor                              Cognition Arousal/Alertness: Awake/alert Behavior During Therapy: WFL for tasks assessed/performed Overall Cognitive Status: Within Functional Limits for tasks assessed                                 General Comments: Motivated to participate        Exercises Total Joint Exercises Quad Sets: AROM, Strengthening, Left, 10 reps Heel Slides: AROM, Strengthening, Left, 10 reps Long Arc Quad: AROM, Strengthening, Left, 10 reps    General Comments        Pertinent Vitals/Pain Pain Assessment Pain Assessment: Faces Faces Pain Scale: Hurts whole lot Pain Location: L knee Pain Descriptors / Indicators: Grimacing, Guarding Pain Intervention(s): Monitored during session, Repositioned    Home Living                          Prior Function            PT Goals (current goals can now be found in the care plan section) Acute Rehab PT Goals Patient Stated Goal: Walk without pain PT Goal Formulation: With patient Time For Goal Achievement: 03/28/22 Potential to Achieve Goals: Good Progress towards PT goals: Progressing toward goals    Frequency    7X/week      PT Plan Current plan remains appropriate    Co-evaluation              AM-PAC PT "6 Clicks" Mobility   Outcome Measure  Help needed turning from your back to your side while in a flat bed without using bedrails?: A Lot Help needed moving from lying on your back to sitting on the side of a flat bed without using bedrails?: Total Help needed  moving to and from a bed to a chair (including a wheelchair)?: A Lot Help needed standing up from a chair using your arms (e.g., wheelchair or bedside chair)?: Total Help needed to walk in hospital room?: Total Help needed climbing 3-5 steps with a railing? : Total 6 Click Score: 8    End of Session Equipment Utilized During Treatment: Gait belt;Oxygen Activity Tolerance: Patient tolerated treatment well Patient left: in chair;with call bell/phone within reach Nurse Communication: Mobility status (BP) PT Visit Diagnosis: Unsteadiness on feet (R26.81);Other abnormalities of gait and mobility (R26.89);Pain Pain - Right/Left: Left Pain - part of body: Knee     Time: 1443-1540 PT Time Calculation (min) (ACUTE ONLY): 23 min  Charges:  $Therapeutic Activity: 23-37 mins  Lavone Nian, PT, DPT 03/15/22, 3:46 PM  Waunita Schooner 03/15/2022, 3:45 PM

## 2022-03-16 ENCOUNTER — Inpatient Hospital Stay (HOSPITAL_COMMUNITY): Payer: Medicare HMO

## 2022-03-16 DIAGNOSIS — N186 End stage renal disease: Secondary | ICD-10-CM

## 2022-03-16 LAB — RENAL FUNCTION PANEL
Albumin: 2.8 g/dL — ABNORMAL LOW (ref 3.5–5.0)
Anion gap: 14 (ref 5–15)
BUN: 50 mg/dL — ABNORMAL HIGH (ref 8–23)
CO2: 27 mmol/L (ref 22–32)
Calcium: 8.3 mg/dL — ABNORMAL LOW (ref 8.9–10.3)
Chloride: 93 mmol/L — ABNORMAL LOW (ref 98–111)
Creatinine, Ser: 9.49 mg/dL — ABNORMAL HIGH (ref 0.44–1.00)
GFR, Estimated: 4 mL/min — ABNORMAL LOW (ref >60)
Glucose, Bld: 115 mg/dL — ABNORMAL HIGH (ref 70–99)
Phosphorus: 5.7 mg/dL — ABNORMAL HIGH (ref 2.5–4.6)
Potassium: 4.1 mmol/L (ref 3.5–5.1)
Sodium: 134 mmol/L — ABNORMAL LOW (ref 135–145)

## 2022-03-16 LAB — CBC
HCT: 29 % — ABNORMAL LOW (ref 36.0–46.0)
Hemoglobin: 9.4 g/dL — ABNORMAL LOW (ref 12.0–15.0)
MCH: 31 pg (ref 26.0–34.0)
MCHC: 32.4 g/dL (ref 30.0–36.0)
MCV: 95.7 fL (ref 80.0–100.0)
Platelets: 141 10*3/uL — ABNORMAL LOW (ref 150–400)
RBC: 3.03 MIL/uL — ABNORMAL LOW (ref 3.87–5.11)
RDW: 14.8 % (ref 11.5–15.5)
WBC: 9.5 10*3/uL (ref 4.0–10.5)
nRBC: 0 % (ref 0.0–0.2)

## 2022-03-16 NOTE — Progress Notes (Signed)
Pt. Off unit for HD

## 2022-03-16 NOTE — Progress Notes (Signed)
PT Cancellation Note  Patient Details Name: DAYSHA ASHMORE MRN: [id number] DOB: 19-Aug-1950   Cancelled Treatment:    Reason Eval/Treat Not Completed: Patient at procedure or test/unavailable Pt off floor at HD. Will follow up as time allows.  Marguarite Arbour A Lafayette Dunlevy 03/16/2022, 9:17 AM Marisa Severin, PT, DPT Acute Rehabilitation Services Secure chat preferred Office [phone]

## 2022-03-16 NOTE — Progress Notes (Signed)
Patient ID: AZAYA GOEDDE, female   DOB: 26-Aug-1950, 71 y.o.   MRN: [id number] S: Unable to have HD today due to inability to cannulate venous limb of AVG, pulling clots.  Pt reports that she has 5 stents in that AVG.  O:BP 117/65 (BP Location: Right Arm)   Pulse 81   Temp 98.4 F (36.9 C) (Oral)   Resp 18   Ht '5\' 2"'$  (1.575 m)   Wt 96.4 kg   SpO2 93%   BMI 38.87 kg/m  No intake or output data in the 24 hours ending 03/16/22 1057 Intake/Output: No intake/output data recorded.  Intake/Output this shift:  No intake/output data recorded. Weight change:  Gen:NAD CVS: RRR Resp:CTa Abd: +BS, soft, NT/ND Ext: no edema, LUE AVG +T/B  Recent Labs  Lab 03/13/22 1319 03/14/22 0046 03/16/22 0827  NA 139 135 134*  K 4.5 4.8 4.1  CL 101 99 93*  CO2  --  23 27  GLUCOSE 85 204* 115*  BUN 46* 42* 50*  CREATININE 8.60* 8.50* 9.49*  ALBUMIN  --   --  2.8*  CALCIUM  --  8.4* 8.3*  PHOS  --   --  5.7*   Liver Function Tests: Recent Labs  Lab 03/16/22 0827  ALBUMIN 2.8*   No results for input(s): "LIPASE", "AMYLASE" in the last 168 hours. No results for input(s): "AMMONIA" in the last 168 hours. CBC: Recent Labs  Lab 03/13/22 1319 03/14/22 0046 03/16/22 0827  WBC  --  10.3 9.5  HGB 14.6 11.8* 9.4*  HCT 43.0 36.2 29.0*  MCV  --  94.3 95.7  PLT  --  178 141*   Cardiac Enzymes: No results for input(s): "CKTOTAL", "CKMB", "CKMBINDEX", "TROPONINI" in the last 168 hours. CBG: Recent Labs  Lab 03/13/22 1235 03/13/22 1727 03/14/22 1632  GLUCAP 82 120* 140*    Iron Studies: No results for input(s): "IRON", "TIBC", "TRANSFERRIN", "FERRITIN" in the last 72 hours. Studies/Results: No results found.  amLODipine  10 mg Oral Daily   aspirin  81 mg Oral BID   atorvastatin  40 mg Oral Daily   carvedilol  12.5 mg Oral BID WC   Chlorhexidine Gluconate Cloth  6 each Topical Q0600   docusate sodium  100 mg Oral BID   levETIRAcetam  1,000 mg Oral BID   lidocaine-prilocaine  1  Application Topical Q M,W,F-HD   losartan  100 mg Oral Daily   multivitamin  1 tablet Oral QHS   pantoprazole  40 mg Oral Daily   sevelamer carbonate  1,600 mg Oral TID WC   torsemide  20 mg Oral Daily    BMET    Component Value Date/Time   NA 134 (L) 03/16/2022 0827   K 4.1 03/16/2022 0827   CL 93 (L) 03/16/2022 0827   CO2 27 03/16/2022 0827   GLUCOSE 115 (H) 03/16/2022 0827   BUN 50 (H) 03/16/2022 0827   BUN 36 (A) 07/08/2017 0000   CREATININE 9.49 (H) 03/16/2022 0827   CALCIUM 8.3 (L) 03/16/2022 0827   GFRNONAA 4 (L) 03/16/2022 0827   GFRAA 9 (L) 11/04/2018 1208   CBC    Component Value Date/Time   WBC 9.5 03/16/2022 0827   RBC 3.03 (L) 03/16/2022 0827   HGB 9.4 (L) 03/16/2022 0827   HCT 29.0 (L) 03/16/2022 0827   PLT 141 (L) 03/16/2022 0827   MCV 95.7 03/16/2022 0827   MCH 31.0 03/16/2022 0827   MCHC 32.4 03/16/2022 0827   RDW 14.8  03/16/2022 0827   LYMPHSABS 1.2 05/01/2021 1043   MONOABS 0.4 05/01/2021 1043   EOSABS 0.1 05/01/2021 1043   BASOSABS 0.0 05/01/2021 1043    Dialysis Orders: Center: [facility]  on MWF . EDW 99kg HD Bath 2K/2.5Ca  Time 3:30 Heparin none. 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  Doing well.  Vascular access malfunction - unable to cannulate AVG today.  Has 5 stents in place. Will consult IR for shuntogram and possible intervention.  ESRD -  plan for HD on MWF schedule.  Will have to try again tomorrow after intervention of AVG.  Hypertension/volume  -  stable but below edw and will need to be changed at discharge.  Anemia  - stable, no ESA  Metabolic bone disease -   continue with home meds  Nutrition - renal diet, carb modified.  Disposition - for discharge tomorrow if able to have HD.  Donetta Potts, MD [facility]

## 2022-03-16 NOTE — Consult Note (Signed)
Chief Complaint: Patient was seen in consultation today for malfunctioning fistula   Referring Physician(s): Dr. Marval Regal   Supervising Physician: Ruthann Cancer  Patient Status: [facility] - In-pt  History of Present Illness: Carrie [name] is a 71 y.o. female with a medical history significant for CHF, HTN and ESRD on hemodialysis via a left brachial AV graft created 11/07/2018. She underwent balloon angioplasty of the graft 11/14/21.   She presented to the hospital 03/13/22 for a left total knee arthroplasty. During today's hemodialysis session the team was unable to cannulate the graft.   Interventional Radiology has been asked to evaluate this patient for an image-guided fistulogram with possible intervention.   Past Medical History:  Diagnosis Date   Allergy    Anemia    Arthritis    "right knee" (10/26'/2017)   Chronic heart failure with preserved ejection fraction (HFpEF) ([place])    a. 04/2021 Echo: EF >55%, GrI DD, nl RV fxn, triv MR; b. 12/2021 Echo: EF 60-65%, no rwma.   Demand ischemia    a. 04/2021 HsTrop up to 73 in setting of volume overload; b. 10/2021 MV: EF 66% with small, mild reversible defect in the apical septal and mid anteroseptal myocardium which was felt to most likely represent artifact.   Depression    ESRD (end stage renal disease) ([place])    a. OnHD (Dr. Lavonia Dana)   History of blood transfusion 2008   "when I had brain tumor surgery"   History of MRSA infection 2008   History of stress test    a. 07/2018 MV: EF 66%, no ischemia/infarct.   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 12/2021 Echo: EF 60-65%, no rwma, mild LVH, nl RV fxn, small pericardial effusion - small pocket of post wall, ~ 1cm.   Precordial chest pain    a. 10/2021 MV: low risk.   Type II diabetes mellitus ([place]) 2011   on metformin until yr ago- no med now - off due to kidneys    Past Surgical History:  Procedure Laterality Date   A/V  FISTULAGRAM Left 03/10/2019   Procedure: A/V FISTULAGRAM;  Surgeon: Katha Cabal, MD;  Location: [place] CV [facility];  Service: Cardiovascular;  Laterality: Left;   A/V FISTULAGRAM Left 07/14/2019   Procedure: A/V FISTULAGRAM;  Surgeon: Katha Cabal, MD;  Location: [place] [facility];  Service: Cardiovascular;  Laterality: Left;   A/V FISTULAGRAM Left 01/07/2020   Procedure: A/V FISTULAGRAM;  Surgeon: Katha Cabal, MD;  Location: [place] [facility];  Service: Cardiovascular;  Laterality: Left;   A/V FISTULAGRAM Left 11/14/2021   Procedure: A/V Fistulagram;  Surgeon: Katha Cabal, MD;  Location: [place] CV [facility];  Service: Cardiovascular;  Laterality: Left;   AV FISTULA PLACEMENT Left 11/07/2018   Procedure: ARTERIOVENOUS (AV) FISTULA CREATION ( BRACHIO BASILIC ) VS BRACHIAL AXILLARY GRAFT;  Surgeon: Katha Cabal, MD;  Location: [facility] OR[facility];  Service: Vascular;  Laterality: Left;   BRAIN MENINGIOMA EXCISION  08/2006   Foramina magnum meningioma   CERVICAL DISCECTOMY  1996   Dr. Alric Seton   CESAREAN SECTION  1981   COLONOSCOPY     COLONOSCOPY WITH PROPOFOL N/A 03/07/2021   Procedure: COLONOSCOPY WITH PROPOFOL;  Surgeon: Virgel Manifold, MD;  Location: [facility];  Service: Endoscopy;  Laterality: N/A;   DIALYSIS/PERMA CATHETER INSERTION N/A 08/04/2018   Procedure: DIALYSIS/PERMA CATHETER INSERTION;  Surgeon: Algernon Huxley, MD;  Location: [place] CV [facility];  Service: Cardiovascular;  Laterality: N/A;   DIALYSIS/PERMA CATHETER REMOVAL N/A 12/25/2018   Procedure: DIALYSIS/PERMA CATHETER REMOVAL;  Surgeon: Algernon Huxley, MD;  Location: [place] [facility];  Service: Cardiovascular;  Laterality: N/A;   DILATION AND CURETTAGE OF UTERUS     JOINT REPLACEMENT     right knee left hip   Right wrist x-ray  2007   Deg. at 1st MCP   TOTAL HIP ARTHROPLASTY Left 03/27/2016   Procedure: LEFT TOTAL HIP ARTHROPLASTY ANTERIOR APPROACH;  Surgeon: Mcarthur Rossetti, MD;   Location: [place];  Service: Orthopedics;  Laterality: Left;   TOTAL KNEE ARTHROPLASTY Right 04/30/2017   Procedure: RIGHT TOTAL KNEE ARTHROPLASTY;  Surgeon: Mcarthur Rossetti, MD;  Location: [place];  Service: Orthopedics;  Laterality: Right;   TOTAL KNEE ARTHROPLASTY Left 03/13/2022   Procedure: LEFT TOTAL KNEE ARTHROPLASTY;  Surgeon: Mcarthur Rossetti, MD;  Location: [place];  Service: Orthopedics;  Laterality: Left;   TUBAL LIGATION  1981    Allergies: Naproxen sodium and Sulfamethoxazole-trimethoprim  Medications: Prior to Admission medications   Medication Sig Start Date End Date Taking? Authorizing Provider  acetaminophen (TYLENOL) 500 MG tablet Take 1,000 mg by mouth every 6 (six) hours as needed for mild pain or moderate pain.   Yes [provider]  amLODipine (NORVASC) 10 MG tablet Take 10 mg by mouth daily.   Yes [provider]  aspirin EC 81 MG tablet Take 81 mg by mouth daily.   Yes [provider]  atorvastatin (LIPITOR) 40 MG tablet TAKE 40 MG BY MOUTH ONCE DAILY Patient taking differently: Take 40 mg by mouth daily. 03/11/18  Yes Viviana Simpler I, MD  carvedilol (COREG) 12.5 MG tablet Take 12.5 mg by mouth 2 (two) times daily with a meal.   Yes [provider]  levETIRAcetam (KEPPRA) 1000 MG tablet TAKE 1 TABLET BY MOUTH TWICE A DAY 03/01/22  Yes Venia Carbon, MD  lidocaine-prilocaine (EMLA) cream Apply 1 application topically every Monday, Wednesday, and Friday with hemodialysis.   Yes [provider]  LORazepam (ATIVAN) 0.5 MG tablet TAKE 1 TABLET BY MOUTH TWICE A DAY AS NEEDED FOR ANXIETY 03/01/22  Yes Venia Carbon, MD  losartan (COZAAR) 100 MG tablet Take 100 mg by mouth daily.   Yes [provider]  multivitamin (RENA-VIT) TABS tablet Take 1 tablet by mouth at bedtime. 08/07/18  Yes Wieting, Richard, MD  omeprazole (PRILOSEC) 20 MG capsule TAKE 1 CAPSULE BY MOUTH EVERY DAY 11/28/21  Yes Viviana Simpler I,  MD  sevelamer carbonate (RENVELA) 800 MG tablet Take 800-1,600 mg by mouth See admin instructions. Take 2 tablets (1600 mg) by mouth three times daily with meals and take 1 tablet (800 mg) by mouth twice daily with snacks   Yes [provider]  torsemide (DEMADEX) 20 MG tablet Take 20 mg by mouth daily. Every day (including on dialysis days)   Yes [provider]  albuterol (VENTOLIN HFA) 108 (90 Base) MCG/ACT inhaler Inhale 2 puffs into the lungs every 6 (six) hours as needed for wheezing or shortness of breath. 05/05/21   Nicole Kindred A, DO  OXYGEN Inhale 2 L into the lungs as needed (Shortness of Breath).    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      No living will   Requests niece, [name], as health care POA   Would accept resuscitation attempts   No tube feeds if cognitively aware   Social Determinants of Health   Financial Resource Strain: Low Risk  (12/06/2020)   Overall Financial Resource Strain (CARDIA)    Difficulty of Paying Living Expenses: Not hard at all  Food Insecurity: No Food Insecurity  (03/13/2022)   Hunger Vital Sign    Worried About Running Out of Food in the Last Year: Never true    [place] in the Last Year: Never true  Transportation Needs: No Transportation Needs (03/13/2022)   PRAPARE - Hydrologist (Medical): No    Lack of Transportation (Non-Medical): No  Physical Activity: Not on file  Stress: Not on file  Social Connections: Not on file    Review of Systems: A 12 point ROS discussed and pertinent positives are indicated in the HPI above.  All other systems are negative.  Review of Systems  Constitutional:  Negative for appetite change and fatigue.  Respiratory:  Negative for cough and shortness of breath.   Cardiovascular:  Negative for chest pain and leg swelling.  Gastrointestinal:  Negative for abdominal pain, diarrhea, nausea and vomiting.  Neurological:  Positive for headaches. Negative for dizziness.    Vital Signs: BP 121/65 (BP Location: Right Arm)   Pulse 84   Temp 99.3 F (37.4 C) (Oral)   Resp 18   Ht '5\' 2"'$  (1.575 m)   Wt 212 lb 8.4 oz (96.4 kg)   SpO2 92%   BMI 38.87 kg/m   Physical Exam Constitutional:      General: She is not in acute distress.    Appearance: She is not ill-appearing.  HENT:     Mouth/Throat:     Mouth: Mucous membranes are moist.     Pharynx: Oropharynx is clear.  Cardiovascular:     Rate and Rhythm: Normal rate and regular rhythm.     Pulses: Normal pulses.     Heart sounds: Normal heart sounds.     Comments: Left upper arm AVG. Positive for faint thrill with strong bruit.  Pulmonary:     Effort: Pulmonary effort is normal.     Breath sounds: Normal breath sounds.  Abdominal:     General: Bowel sounds are normal.     Palpations: Abdomen is soft.     Tenderness: There is no abdominal tenderness.  Musculoskeletal:     Right lower leg: No edema.     Left lower leg: No edema.  Skin:  General: Skin is warm and dry.  Neurological:     Mental Status: She is alert  and oriented to person, place, and time.     Imaging: DG Knee Left Port  Result Date: 03/13/2022 CLINICAL DATA:  Status post total left knee arthroplasty. EXAM: PORTABLE LEFT KNEE - 1-2 VIEW COMPARISON:  Left knee radiographs 08/22/2020 FINDINGS: Interval total left knee arthroplasty. No perihardware lucency is seen to indicate hardware failure or loosening. Expected postoperative changes including anterior and lateral subcutaneous air. Small joint effusion with mild intra-articular air. Anterior surgical skin staples. No acute fracture or dislocation. IMPRESSION: Interval total left knee arthroplasty without evidence of hardware failure. Electronically Signed   By: Yvonne Kendall M.D.   On: 03/13/2022 17:36    Labs:  CBC: Recent Labs    05/04/21 0534 03/01/22 1500 03/13/22 1319 03/14/22 0046 03/16/22 0827  WBC 4.2 5.2  --  10.3 9.5  HGB 10.0* 13.0 14.6 11.8* 9.4*  HCT 30.8* 41.4 43.0 36.2 29.0*  PLT 176 172  --  178 141*    COAGS: Recent Labs    05/01/21 1043  INR 1.0    BMP: Recent Labs    05/04/21 0534 05/05/21 0458 03/13/22 1319 03/14/22 0046 03/16/22 0827  NA 134* 134* 139 135 134*  K 3.9 3.9 4.5 4.8 4.1  CL 95* 95* 101 99 93*  CO2 30 29  --  23 27  GLUCOSE 101* 87 85 204* 115*  BUN 36* 56* 46* 42* 50*  CALCIUM 8.3* 8.3*  --  8.4* 8.3*  CREATININE 6.90* 8.77* 8.60* 8.50* 9.49*  GFRNONAA 6* 4*  --  5* 4*    LIVER FUNCTION TESTS: Recent Labs    05/01/21 1043 05/02/21 0524 03/16/22 0827  BILITOT 0.7 0.7  --   AST 20 14*  --   ALT 14 12  --   ALKPHOS 102 95  --   PROT 7.9 7.3  --   ALBUMIN 4.0 3.6 2.8*    TUMOR MARKERS: No results for input(s): "AFPTM", "CEA", "CA199", "CHROMGRNA" in the last 8760 hours.  Assessment and Plan:  ESRD on hemodialysis; malfunctioning left AV graft: Evalisse D. Antonetti, 71 year old female, is tentatively scheduled for an image-guided fistulogram with possible intervention Monday, 03/20/22 pending the results of a left  upper extremity duplex. This has been ordered and results are pending.   Risks and benefits discussed with the patient including, but not limited to bleeding, infection, vascular injury, pulmonary embolism, need for tunneled HD catheter placement or even death.  All of the patient's questions were answered, patient is agreeable to proceed.  Consent signed and in IR.   Thank you for this interesting consult.  I greatly enjoyed meeting Sakeenah D Molyneux and look forward to participating in their care.  A copy of this report was sent to the requesting provider on this date.  Electronically Signed: Soyla Dryer, AGACNP-BC [phone] 03/16/2022, 3:59 PM   I spent a total of 20 Minutes    in face to face in clinical consultation, greater than 50% of which was counseling/coordinating care for fistulogram with possible intervention

## 2022-03-16 NOTE — Progress Notes (Signed)
Dialysis access duplex completed. Refer to "CV Proc" under chart review to view preliminary results.  03/16/2022 3:51 PM Kelby Aline., MHA, RVT, RDCS, RDMS

## 2022-03-16 NOTE — Progress Notes (Signed)
Patient ID: ASHNA DOROUGH, female   DOB: August 14, 1950, 71 y.o.   MRN: [id number] The patient is awake and alert this morning.  Her left operative knee is stable.  The plan is for dialysis today and then continue therapy with hopeful discharge to home tomorrow.  We needed to keep her in extra day due to the effects of dialysis on her making her fall risk.  She needs that extra therapy in order to be discharged safely to home.  Her vital signs are stable this morning.

## 2022-03-16 NOTE — Progress Notes (Signed)
Pt was stuck multiple times on her venous access by 2 DT. Each DT pulled back clots from the access site. Pt almost in tears and refuses to let anyone stick her further. Dr. Marval Regal was called and made aware of the situation. No new orders received. DT is now holding sites w/ increased holding time d/t the excessive bleeding from the venous site caused by the increased pressure most likely caused by the clots

## 2022-03-16 NOTE — Progress Notes (Addendum)
Physical Therapy Treatment Patient Details Name: Carrie [name] MRN: [id number] DOB: 05-08-1951 Today's Date: 03/16/2022   History of Present Illness Pt is 71 yo female Admitted for L TKA on 10/10 with WBAT.  Pt  has a past medical history of Allergy, Anemia, Arthritis, Chronic heart failure with preserved ejection fraction ), Demand ischemia, Depression, ESRD, Hypertension, Meningioma, Pericardial effusion, Precordial chest pain, and Type II diabetes mellitus    PT Comments    Patient progressing well towards PT goals. Session focused on transfers, pre gait and there ex. Pt limited mainly by pain in left knee. Requires Max A for bed mobility, standing and mod A for step pivot transfer to/from [facility] with assist for balance, RW management and safety. Knee AROM ~10-55 degrees. Tolerated there ex. Limited mainly by dizziness. Supine BP120/60, Sitting BP 99/56, Sitting on BSC post transfer BP 111/59. Dizziness during mobility. Only able to have 1 session today as pt at HD this morning which had to be aborted due to clotting in fistula. Plan for HD tomorrow. Will follow.    Recommendations for follow up therapy are one component of a multi-disciplinary discharge planning process, led by the attending physician.  Recommendations may be updated based on patient status, additional functional criteria and insurance authorization.  Follow Up Recommendations  Skilled nursing-short term rehab (<3 hours/day) Can patient physically be transported by private vehicle: No   Assistance Recommended at Discharge Frequent or constant Supervision/Assistance  Patient can return home with the following A lot of help with walking and/or transfers;A lot of help with bathing/dressing/bathroom;Assistance with cooking/housework;Help with stairs or ramp for entrance;Assist for transportation   Equipment Recommendations  Rolling walker (2 wheels)    Recommendations for Other Services       Precautions / Restrictions  Precautions Precautions: Knee;Fall Precaution Booklet Issued: Yes (comment) Precaution Comments: Orthostatic hypotension Restrictions Weight Bearing Restrictions: Yes LLE Weight Bearing: Weight bearing as tolerated     Mobility  Bed Mobility Overal bed mobility: Needs Assistance Bed Mobility: Supine to Sit     Supine to sit: Max assist, HOB elevated Sit to supine: Max assist   General bed mobility comments: Max A to get to EOB, assist with BLEs, trunk and scooting bottom to EOB. Able to use rail to assist, + dizziness. ASsist tobring LEs into bed with pt falling backwards onto pillow.    Transfers Overall transfer level: Needs assistance Equipment used: Rolling walker (2 wheels) Transfers: Sit to/from Stand Sit to Stand: Max assist, From elevated surface   Step pivot transfers: Mod assist       General transfer comment: Stood from EOB x1 with max A, cues for hand placement/technique, slow to rise, Mod A to stand from [facility], posterior bias with cues for proper foot placement under CoM. able to perform step pivot transfer bed to/from [facility] with mod A for balance, RW management and safety. Dizziness throughout session. Sp02 90% when able to get reading.    Ambulation/Gait               General Gait Details: Deferred due to dizziness and pain.   Stairs             Wheelchair Mobility    Modified Rankin (Stroke Patients Only)       Balance Overall balance assessment: Needs assistance Sitting-balance support: Feet supported, No upper extremity supported Sitting balance-Leahy Scale: Fair Sitting balance - Comments: supervision static sitting, posterior lean with thereex of LEs   Standing balance support: During functional  activity, Reliant on assistive device for balance, Bilateral upper extremity supported Standing balance-Leahy Scale: Poor Standing balance comment: RW and assist                            Cognition Arousal/Alertness:  Awake/alert Behavior During Therapy: WFL for tasks assessed/performed Overall Cognitive Status: Within Functional Limits for tasks assessed                                 General Comments: Motivated to participate        Exercises Total Joint Exercises Ankle Circles/Pumps: AROM, Both, 10 reps, Supine Long Arc Quad: Strengthening, Left, 10 reps, AAROM Knee Flexion: AROM, Left, 5 reps (20 sec hold x4) Goniometric ROM: ~10-55 degrees knee AROM    General Comments General comments (skin integrity, edema, etc.): Supine BP120/60, Sitting BP 99/56, Sitting on BSC post transfer BP 111/59. Dizziness during mobility.      Pertinent Vitals/Pain Pain Assessment Pain Assessment: Faces Faces Pain Scale: Hurts whole lot Pain Location: Lft knee Pain Descriptors / Indicators: Grimacing, Guarding, Sore, Operative site guarding Pain Intervention(s): Monitored during session, Repositioned, Limited activity within patient's tolerance    Home Living                          Prior Function            PT Goals (current goals can now be found in the care plan section) Progress towards PT goals: Progressing toward goals    Frequency    7X/week      PT Plan Current plan remains appropriate    Co-evaluation              AM-PAC PT "6 Clicks" Mobility   Outcome Measure  Help needed turning from your back to your side while in a flat bed without using bedrails?: A Lot Help needed moving from lying on your back to sitting on the side of a flat bed without using bedrails?: Total Help needed moving to and from a bed to a chair (including a wheelchair)?: Total Help needed standing up from a chair using your arms (e.g., wheelchair or bedside chair)?: Total Help needed to walk in hospital room?: Total Help needed climbing 3-5 steps with a railing? : Total 6 Click Score: 7    End of Session Equipment Utilized During Treatment: Gait belt Activity Tolerance:  Patient limited by pain Patient left: in bed;with call bell/phone within reach;with bed alarm set Nurse Communication: Mobility status PT Visit Diagnosis: Unsteadiness on feet (R26.81);Other abnormalities of gait and mobility (R26.89);Pain Pain - Right/Left: Left Pain - part of body: Knee     Time: 1343-1410 PT Time Calculation (min) (ACUTE ONLY): 27 min  Charges:  $Therapeutic Exercise: 8-22 mins $Therapeutic Activity: 8-22 mins                     Marisa Severin, PT, DPT Acute Rehabilitation Services Secure chat preferred Office [phone]      Carrie [name] 03/16/2022, 2:22 PM

## 2022-03-17 MED ORDER — ASPIRIN 81 MG PO CHEW: 81.0000 mg | CHEWABLE_TABLET | Freq: Two times a day (BID) | ORAL | 0 refills | Status: DC

## 2022-03-17 MED ORDER — OXYCODONE HCL 5 MG PO TABS: 5.0000 mg | ORAL_TABLET | ORAL | 0 refills | Status: DC | PRN

## 2022-03-17 NOTE — Progress Notes (Signed)
Physical Therapy Treatment Patient Details Name: Carrie [name] MRN: [id number] DOB: [birth year] Today's Date: 03/17/2022   History of Present Illness Pt is 71 yo female Admitted for L TKA on 10/10 with WBAT.  Pt  has a past medical history of Allergy, Anemia, Arthritis, Chronic heart failure with preserved ejection fraction ), Demand ischemia, Depression, ESRD, Hypertension, Meningioma, Pericardial effusion, Precordial chest pain, and Type II diabetes mellitus    PT Comments    AM session: Pt received supine, motivated for OOB mobility and with great progress. Pt without dizziness and negative orthostatic hypotension, with BP stable throughout (see general comments below). Pt able to come to sitting EOB with light min assist to manage LLE and complete transfers to sit<>stand with mod assist at start down to min guard with task practice, completing x3 throughout session. Pt able to progress gait training with min guard and cues throughout for sequencing for 27' total with x1 seated rest break. Plan for stair training in PM session as pt with 2 steps to enter home. Pt continues to benefit from skilled PT services to progress toward functional mobility goals.    Recommendations for follow up therapy are one component of a multi-disciplinary discharge planning process, led by the attending physician.  Recommendations may be updated based on patient status, additional functional criteria and insurance authorization.  Follow Up Recommendations  Skilled nursing-short term rehab (<3 hours/day) Can patient physically be transported by private vehicle: No   Assistance Recommended at Discharge Frequent or constant Supervision/Assistance  Patient can return home with the following A lot of help with walking and/or transfers;A lot of help with bathing/dressing/bathroom;Assistance with cooking/housework;Help with stairs or ramp for entrance;Assist for transportation   Equipment Recommendations  Rolling  walker (2 wheels)    Recommendations for Other Services       Precautions / Restrictions Precautions Precautions: Knee;Fall Precaution Booklet Issued: Yes (comment) Precaution Comments: Orthostatic hypotension Restrictions Weight Bearing Restrictions: Yes LLE Weight Bearing: Weight bearing as tolerated     Mobility  Bed Mobility Overal bed mobility: Needs Assistance Bed Mobility: Supine to Sit     Supine to sit: HOB elevated, Min assist     General bed mobility comments: light min assist to manage LLE    Transfers Overall transfer level: Needs assistance Equipment used: Rolling walker (2 wheels) Transfers: Sit to/from Stand Sit to Stand: Mod assist, Min guard   Step pivot transfers: Min guard       General transfer comment: mod assist for first stand from EOB, min guard to step pivot to BSC, min guard for x2 more stands from [facility] and EOB, light cues at start for hand placement with good carry through    Ambulation/Gait Ambulation/Gait assistance: Min guard Gait Distance (Feet): 12 Feet (+15 after seated rest) Assistive device: Rolling walker (2 wheels) Gait Pattern/deviations: Step-to pattern, Antalgic Gait velocity: decreased     General Gait Details: slow antalgic gait with RW, cues throughout for sequencing   Stairs             Wheelchair Mobility    Modified Rankin (Stroke Patients Only)       Balance Overall balance assessment: Needs assistance Sitting-balance support: Feet supported, No upper extremity supported Sitting balance-Leahy Scale: Good     Standing balance support: During functional activity, Reliant on assistive device for balance, Bilateral upper extremity supported Standing balance-Leahy Scale: Fair Standing balance comment: BUE support of RW during activity, able to static stand without UE support  Cognition Arousal/Alertness: Awake/alert Behavior During Therapy: WFL for tasks  assessed/performed Overall Cognitive Status: Within Functional Limits for tasks assessed                                 General Comments: Motivated to participate        Exercises Total Joint Exercises Quad Sets: AROM, Strengthening, Left, 10 reps    General Comments General comments (skin integrity, edema, etc.): no dizziness, BP stable throughout, supine 130/70, sitting EOB 127/66, standing after pivot to [facility] 137/71      Pertinent Vitals/Pain Pain Assessment Pain Assessment: 0-10 Pain Score: 10-Worst pain ever Pain Descriptors / Indicators: Grimacing, Guarding, Sore, Operative site guarding Pain Intervention(s): Monitored during session, Limited activity within patient's tolerance, Repositioned    Home Living                          Prior Function            PT Goals (current goals can now be found in the care plan section) Acute Rehab PT Goals Patient Stated Goal: go home PT Goal Formulation: With patient Time For Goal Achievement: 03/28/22    Frequency    7X/week      PT Plan      Co-evaluation              AM-PAC PT "6 Clicks" Mobility   Outcome Measure  Help needed turning from your back to your side while in a flat bed without using bedrails?: A Little Help needed moving from lying on your back to sitting on the side of a flat bed without using bedrails?: A Little Help needed moving to and from a bed to a chair (including a wheelchair)?: A Little Help needed standing up from a chair using your arms (e.g., wheelchair or bedside chair)?: A Little Help needed to walk in hospital room?: A Little Help needed climbing 3-5 steps with a railing? : A Lot 6 Click Score: 17    End of Session   Activity Tolerance: Patient tolerated treatment well Patient left: with call bell/phone within reach;in chair;with family/visitor present Nurse Communication: Mobility status PT Visit Diagnosis: Unsteadiness on feet (R26.81);Other  abnormalities of gait and mobility (R26.89);Pain Pain - Right/Left: Left Pain - part of body: Knee     Time: 6144-3154 PT Time Calculation (min) (ACUTE ONLY): 24 min  Charges:  $Gait Training: 8-22 mins $Therapeutic Activity: 8-22 mins                     Burdette Forehand R. PTA Acute Rehabilitation Services Office: [place] 03/17/2022, 9:16 AM

## 2022-03-17 NOTE — TOC Transition Note (Signed)
Transition of Care [facility]) - CM/SW Discharge Note   Patient Details  Name: SHERMA VANMETRE MRN: [id number] Date of Birth: 05/05/1951  Transition of [facility]) CM/SW Contact:  Bartholomew Crews, RN Phone Number: [phone] 03/17/2022, 8:57 AM   Clinical Narrative:     Patient to transition home today pending nephrology clearance. Patient is an ortho bundle. HH PT referral initiated outpatient to [place]. Liaision at [facility] notified of anticipated transition today. Noted outpatient CM note that patient has all needed DME (BSC, RW, cane). No further TOC needs identified at this time.   Final next level of care: [place] Barriers to Discharge: No Barriers Identified   Patient Goals and CMS Choice Patient states their goals for this hospitalization and ongoing recovery are:: be able to do all her usual things, like driving   Choice offered to / list presented to : NA  Discharge Placement                       Discharge Plan and Services In-house Referral: Clinical Social Work   Post Acute Care Choice: Home Health          DME Arranged: N/A DME Agency: NA         HH Agency: [place] Date [facility] Agency Contacted: 03/17/22 Time [place]: [phone] Representative spoke with at [place]: [place] (SDOH) Interventions     Readmission Risk Interventions     No data to display

## 2022-03-17 NOTE — Progress Notes (Signed)
Patient ID: Carrie [name], female   DOB: 1950/06/17, 71 y.o.   MRN: [id number] The patient is awake and alert this morning.  Her left operative knee is stable.  According to the patient and the notes in epic, she was unable to have hemodialysis yesterday secondary to the inability to access her AV fistula.  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  The patient's dialysis is usually Monday Wednesday Friday schedule.  My plan originally was to discharge her home today and she will resume her outpatient dialysis on Monday.  Her vital signs are stable and her labs been stable.  I will likely need to keep her here today to see what further recommendations are nephrology in terms of whether or not any other intervention is needed for her dialysis access during this hospitalization.

## 2022-03-17 NOTE — Progress Notes (Signed)
Patient ID: Carrie [name], female   DOB: [birth year], 71 y.o.   MRN: [id number] The patient states that she has dealt with this type of situation before and is usually resolved by her specialist in [place] between the dialysis center and a physician in [place] who helps give her access.  She says that she feels comfortable going home today and less otherwise indicated.  I will put in for discharge to home for later this afternoon unless it is indicated otherwise from a nephrology standpoint.

## 2022-03-17 NOTE — Discharge Summary (Signed)
Patient ID: Carrie [name] MRN: [id number] DOB/AGE: 1951-02-25 71 y.o.  Admit date: 03/13/2022 Discharge date: 03/17/2022  Admission Diagnoses:  Principal Problem:   Arthritis of left knee Active Problems:   OA (osteoarthritis) of knee   Status post total left knee replacement   Discharge Diagnoses:  Same  Past Medical History:  Diagnosis Date   Allergy    Anemia    Arthritis    "right knee" (10/26'/2017)   Chronic heart failure with preserved ejection fraction (HFpEF) ([place])    a. 04/2021 Echo: EF >55%, GrI DD, nl RV fxn, triv MR; b. 12/2021 Echo: EF 60-65%, no rwma.   Demand ischemia    a. 04/2021 HsTrop up to 73 in setting of volume overload; b. 10/2021 MV: EF 66% with small, mild reversible defect in the apical septal and mid anteroseptal myocardium which was felt to most likely represent artifact.   Depression    ESRD (end stage renal disease) ([place])    a. On[facility] (Dr. Lavonia Dana)   History of blood transfusion 2008   "when I had brain tumor surgery"   History of MRSA infection 2008   History of stress test    a. 07/2018 MV: EF 66%, no ischemia/infarct.   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 12/2021 Echo: EF 60-65%, no rwma, mild LVH, nl RV fxn, small pericardial effusion - small pocket of post wall, ~ 1cm.   Precordial chest pain    a. 10/2021 MV: low risk.   Type II diabetes mellitus ([place]) 2011   on metformin until yr ago- no med now - off due to kidneys    Surgeries: Procedure(s): LEFT TOTAL KNEE ARTHROPLASTY on 03/13/2022   Consultants: Treatment Team:  Donato Heinz, MD  Discharged Condition: Improved  Hospital Course: Carrie [name] is an 72 y.o. female who was admitted 03/13/2022 for operative treatment ofArthritis of left knee. Patient has severe unremitting pain that affects sleep, daily activities, and work/hobbies. After pre-op clearance the patient was taken to the operating room on 03/13/2022 and  underwent  Procedure(s): LEFT TOTAL KNEE ARTHROPLASTY.    Patient was given perioperative antibiotics:  Anti-infectives (From admission, onward)    Start     Dose/Rate Route Frequency Ordered Stop   03/13/22 2000  ceFAZolin (ANCEF) IVPB 1 g/50 mL premix        1 g 100 mL/hr over 30 Minutes Intravenous Every 6 hours 03/13/22 1757 03/14/22 0319   03/13/22 1130  ceFAZolin (ANCEF) IVPB 2g/100 mL premix        2 g 200 mL/hr over 30 Minutes Intravenous On call to O.R. 03/13/22 1122 03/13/22 1445        Patient was given sequential compression devices, early ambulation, and chemoprophylaxis to prevent DVT.  Patient benefited maximally from hospital stay and there were no complications.  The patient does have chronic end-stage renal disease and did have dialysis on Wednesday during her hospitalization.  She was unable to have dialysis on Friday, 03/16/2022 due to stenosis of her vascular access confirmed with a venogram.  She states that this is something that is not new to her and that she was still comfortable being discharged and following up with her dialysis center in [place] on Monday, 03/19/2022.  Recent vital signs: Patient Vitals for the past 24 hrs:  BP Temp Temp src Pulse Resp SpO2  03/17/22 0847 130/70 -- -- 80 20 --  03/17/22 0425 117/69  98 F (36.7 C) -- 79 18 93 %  03/16/22 2025 (!) 100/50 99.8 F (37.7 C) Oral 89 18 90 %     Recent laboratory studies:  Recent Labs    03/16/22 0827  WBC 9.5  HGB 9.4*  HCT 29.0*  PLT 141*  NA 134*  K 4.1  CL 93*  CO2 27  BUN 50*  CREATININE 9.49*  GLUCOSE 115*  CALCIUM 8.3*     Discharge Medications:   Allergies as of 03/17/2022       Reactions   Naproxen Sodium Anaphylaxis, Swelling   Sulfamethoxazole-trimethoprim Anaphylaxis, Swelling        Medication List     STOP taking these medications    aspirin EC 81 MG tablet Replaced by: aspirin 81 MG chewable tablet       TAKE these medications     acetaminophen 500 MG tablet Commonly known as: TYLENOL Take 1,000 mg by mouth every 6 (six) hours as needed for mild pain or moderate pain.   albuterol 108 (90 Base) MCG/ACT inhaler Commonly known as: VENTOLIN HFA Inhale 2 puffs into the lungs every 6 (six) hours as needed for wheezing or shortness of breath.   amLODipine 10 MG tablet Commonly known as: NORVASC Take 10 mg by mouth daily.   aspirin 81 MG chewable tablet Chew 1 tablet (81 mg total) by mouth 2 (two) times daily. Replaces: aspirin EC 81 MG tablet   atorvastatin 40 MG tablet Commonly known as: LIPITOR TAKE 40 MG BY MOUTH ONCE DAILY What changed: See the new instructions.   carvedilol 12.5 MG tablet Commonly known as: COREG Take 12.5 mg by mouth 2 (two) times daily with a meal.   levETIRAcetam 1000 MG tablet Commonly known as: KEPPRA TAKE 1 TABLET BY MOUTH TWICE A DAY   lidocaine-prilocaine cream Commonly known as: EMLA Apply 1 application topically every Monday, Wednesday, and Friday with hemodialysis.   LORazepam 0.5 MG tablet Commonly known as: ATIVAN TAKE 1 TABLET BY MOUTH TWICE A DAY AS NEEDED FOR ANXIETY   losartan 100 MG tablet Commonly known as: COZAAR Take 100 mg by mouth daily.   multivitamin Tabs tablet Take 1 tablet by mouth at bedtime.   omeprazole 20 MG capsule Commonly known as: PRILOSEC TAKE 1 CAPSULE BY MOUTH EVERY DAY   oxyCODONE 5 MG immediate release tablet Commonly known as: Oxy IR/ROXICODONE Take 1-2 tablets (5-10 mg total) by mouth every 4 (four) hours as needed for moderate pain (pain score 4-6).   OXYGEN Inhale 2 L into the lungs as needed (Shortness of Breath).   sevelamer carbonate 800 MG tablet Commonly known as: RENVELA Take 800-1,600 mg by mouth See admin instructions. Take 2 tablets (1600 mg) by mouth three times daily with meals and take 1 tablet (800 mg) by mouth twice daily with snacks   torsemide 20 MG tablet Commonly known as: DEMADEX Take 20 mg by mouth  daily. Every day (including on dialysis days)               Durable Medical Equipment  (From admission, onward)           Start     Ordered   03/13/22 1758  DME 3 n 1  Once        03/13/22 1757   03/13/22 1758  DME Walker rolling  Once       Question Answer Comment  Walker: With [place]   Patient needs a walker to treat with the  following condition Status post total left knee replacement      03/13/22 1757            Diagnostic Studies: VAS US DUPLEX DIALYSIS ACCESS (AVF, AVG)  Result Date: 03/17/2022 DIALYSIS ACCESS Patient Name:  Carrie [name]  Date of Exam:   03/16/2022 Medical Rec #: [id number]         Accession #:    [id number] Date of Birth: September 04, 1950         Patient Gender: F Patient Age:   40 years Exam Location:  [facility] Procedure:      VAS US DUPLEX DIALYSIS ACCESS (AVF, AVG) Referring Phys: Rushie Nyhan --------------------------------------------------------------------------------  Reason for Exam: Unable to dialyze through AVF/AVG. Access Site: Left Upper Extremity. Access Type: AVF with interpositional graft. Comparison Study: 12/21/21 dialysis access duplex-The Left Brachial Axillary AVG                   appears to be patent throughout; Flow                   Volume appears to be normal. A Slight Stricture seen in the                   Mid Segment resulting in Increased velocities in the Mid                   segment. Performing Technologist: Maudry Mayhew MHA, RDMS, RVT, RDCS  Examination Guidelines: A complete evaluation includes B-mode imaging, spectral Doppler, color Doppler, and power Doppler as needed of all accessible portions of each vessel. Unilateral testing is considered an integral part of a complete examination. Limited examinations for reoccurring indications may be performed as noted.  Findings:   +-------------------+----------+----------------+------------------------------+ AVG                PSV (cm/s)    Flow  Vol               Describe                                             (mL/min)                                   +-------------------+----------+----------------+------------------------------+ Native artery         105          937                                      inflow                                                                      +-------------------+----------+----------------+------------------------------+ Arterial               73  anastomosis                                                                 +-------------------+----------+----------------+------------------------------+ Prox graft            893                     stenotic and residual lumen                                                             <65m              +-------------------+----------+----------------+------------------------------+ Mid graft             388                               stenotic            +-------------------+----------+----------------+------------------------------+ Distal graft          153                                                   +-------------------+----------+----------------+------------------------------+ Venous anastomosis    146                                                   +-------------------+----------+----------------+------------------------------+ Venous outflow        219                                                   +-------------------+----------+----------------+------------------------------+  Summary: Arteriovenous graft-Residual lumen of less thatn 269mnoted. Arteriovenous graft-Stenosis noted. *See table(s) above for measurements and observations.  Diagnosing physician: ChMonica MartinezD Electronically signed by ChMonica MartinezD on 03/17/2022 at 10:03:11 AM.   --------------------------------------------------------------------------------    Final    DG Knee Left Port  Result Date: 03/13/2022 CLINICAL DATA:  Status post total left knee arthroplasty. EXAM: PORTABLE LEFT KNEE - 1-2 VIEW COMPARISON:  Left knee radiographs 08/22/2020 FINDINGS: Interval total left knee arthroplasty. No perihardware lucency is seen to indicate hardware failure or loosening. Expected postoperative changes including anterior and lateral subcutaneous air. Small joint effusion with mild intra-articular air. Anterior surgical skin staples. No acute fracture or dislocation. IMPRESSION: Interval total left knee arthroplasty without evidence of hardware failure. Electronically Signed   By: RoYvonne Kendall.D.   On: 03/13/2022 17:36    Disposition: Discharge disposition: 01-Home or [place]   BlMcarthur RossettiMD Follow up in 2 week(s).   Specialty: Orthopedic  Surgery Contact information: [place] [address] [phone]         [facility], [place] Follow up.   Specialty: [place] Why: Someone from the office at [facility] wil call to schedule physical therapy visits Contact information: [place] [address] [phone]                  Signed: Mcarthur Rossetti 03/17/2022, 3:28 PM

## 2022-03-17 NOTE — Progress Notes (Signed)
Physical Therapy Treatment Patient Details Name: Carrie [name] MRN: [id number] DOB: [birth year] Today's Date: 03/17/2022   History of Present Illness Pt is 71 yo female Admitted for L TKA on 10/10 with WBAT.  Pt  has a past medical history of Allergy, Anemia, Arthritis, Chronic heart failure with preserved ejection fraction ), Demand ischemia, Depression, ESRD, Hypertension, Meningioma, Pericardial effusion, Precordial chest pain, and Type II diabetes mellitus    PT Comments    PM session: Pt with continued progress with session focused on safe stair negotiation for ultimate safe d/c to home. Pt able to ascend/descend 2 steps without fault with good recall of sequencing from prior surgeries. PT continues to demonstrate stable gait with RW with distance limited by fatigue and pain. Educated pt re; importance of continued mobility, ice, safe car entry exit and activity recommendation with pt verbalizing understanding. Anticipate safe discharge, with assistance stated below, once medically cleared, will follow acutely.    Recommendations for follow up therapy are one component of a multi-disciplinary discharge planning process, led by the attending physician.  Recommendations may be updated based on patient status, additional functional criteria and insurance authorization.  Follow Up Recommendations  Skilled nursing-short term rehab (<3 hours/day) Can patient physically be transported by private vehicle: No   Assistance Recommended at Discharge Frequent or constant Supervision/Assistance  Patient can return home with the following A lot of help with walking and/or transfers;A lot of help with bathing/dressing/bathroom;Assistance with cooking/housework;Help with stairs or ramp for entrance;Assist for transportation   Equipment Recommendations  Rolling walker (2 wheels)    Recommendations for Other Services       Precautions / Restrictions Precautions Precautions: Knee;Fall Precaution  Booklet Issued: Yes (comment) Precaution Comments: Orthostatic hypotension Restrictions Weight Bearing Restrictions: Yes LLE Weight Bearing: Weight bearing as tolerated     Mobility  Bed Mobility Overal bed mobility: Needs Assistance Bed Mobility: Supine to Sit     Supine to sit: HOB elevated, Min guard     General bed mobility comments: min guard for safety    Transfers Overall transfer level: Needs assistance Equipment used: Rolling walker (2 wheels) Transfers: Sit to/from Stand, Bed to chair/wheelchair/BSC Sit to Stand: Min guard   Step pivot transfers: Min guard       General transfer comment: min guard for safety    Ambulation/Gait Ambulation/Gait assistance: Min guard Gait Distance (Feet): 15 Feet Assistive device: Rolling walker (2 wheels) Gait Pattern/deviations: Step-to pattern, Antalgic Gait velocity: decreased     General Gait Details: slow antalgic gait with RW, good recall for sequencing, distance limited for focus on stair training   Stairs Stairs: Yes Stairs assistance: Min guard Stair Management: Two rails, Step to pattern, Forwards Number of Stairs: 2 General stair comments: no LOB, good recall of sequencing from precious sx   Wheelchair Mobility    Modified Rankin (Stroke Patients Only)       Balance Overall balance assessment: Needs assistance Sitting-balance support: Feet supported, No upper extremity supported Sitting balance-Leahy Scale: Good     Standing balance support: During functional activity, Reliant on assistive device for balance, Bilateral upper extremity supported Standing balance-Leahy Scale: Fair Standing balance comment: BUE support of RW during activity, able to static stand without UE support                            Cognition Arousal/Alertness: Awake/alert Behavior During Therapy: WFL for tasks assessed/performed Overall Cognitive Status: Within Functional Limits for  tasks assessed                                  General Comments: Motivated to participate        Exercises Total Joint Exercises Quad Sets: AROM, Strengthening, Left, 10 reps    General Comments General comments (skin integrity, edema, etc.): no c/o dizziness throughout      Pertinent Vitals/Pain Pain Assessment Pain Assessment: Faces Pain Score: 10-Worst pain ever Faces Pain Scale: Hurts a little bit Pain Descriptors / Indicators: Grimacing, Guarding, Sore, Operative site guarding Pain Intervention(s): Monitored during session, Limited activity within patient's tolerance, Repositioned    Home Living                          Prior Function            PT Goals (current goals can now be found in the care plan section) Acute Rehab PT Goals Patient Stated Goal: go home PT Goal Formulation: With patient Time For Goal Achievement: 03/28/22    Frequency    7X/week      PT Plan      Co-evaluation              AM-PAC PT "6 Clicks" Mobility   Outcome Measure  Help needed turning from your back to your side while in a flat bed without using bedrails?: A Little Help needed moving from lying on your back to sitting on the side of a flat bed without using bedrails?: A Little Help needed moving to and from a bed to a chair (including a wheelchair)?: A Little Help needed standing up from a chair using your arms (e.g., wheelchair or bedside chair)?: A Little Help needed to walk in hospital room?: A Little Help needed climbing 3-5 steps with a railing? : A Little 6 Click Score: 18    End of Session   Activity Tolerance: Patient tolerated treatment well Patient left: with call bell/phone within reach;in chair;with family/visitor present Nurse Communication: Mobility status PT Visit Diagnosis: Unsteadiness on feet (R26.81);Other abnormalities of gait and mobility (R26.89);Pain Pain - Right/Left: Left Pain - part of body: Knee     Time: 5625-6389 PT Time  Calculation (min) (ACUTE ONLY): 17 min  Charges:  $Gait Training: 8-22 mins                     Edla Para R. PTA Acute Rehabilitation Services Office: [place] 03/17/2022, 2:32 PM

## 2022-03-17 NOTE — Progress Notes (Signed)
Mobility Specialist Progress Note    03/17/22 1228  Mobility  Activity Ambulated with assistance in room  Level of Assistance Minimal assist, patient does 75% or more  Assistive Device Front wheel walker  Distance Ambulated (ft) 4 ft  Activity Response Tolerated well  Mobility Referral Yes  $Mobility charge 1 Mobility   Pt received in chair requesting to get to bed. C/o some pain. Left with call bell in reach.    Hildred Alamin Mobility Specialist  Secure Chat Only

## 2022-03-17 NOTE — Progress Notes (Signed)
Patient ID: Carrie [name], female   DOB: Jan 31, 1951, 71 y.o.   MRN: [id number] S: She feels well and wants to go home.  She does not want to try HD here and would prefer to go to her outpatient HD unit and follow up with Dr. Ronalee Belts who is her regular vascular surgeon.  She understands the risks and says that she is very careful with her fluid and is below her edw. 3  O:BP 130/70 (BP Location: Right Arm)   Pulse 80   Temp 98 F (36.7 C)   Resp 20   Ht '5\' 2"'$  (1.575 m)   Wt 96.4 kg   SpO2 93%   BMI 38.87 kg/m  No intake or output data in the 24 hours ending 03/17/22 1150 Intake/Output: No intake/output data recorded.  Intake/Output this shift:  No intake/output data recorded. Weight change:  Gen:NAD CVS: RRR Resp:CTA Abd: +BS, sfot, NT/ND Ext: no edema, LUE AVG +T/B  Recent Labs  Lab 03/13/22 1319 03/14/22 0046 03/16/22 0827  NA 139 135 134*  K 4.5 4.8 4.1  CL 101 99 93*  CO2  --  23 27  GLUCOSE 85 204* 115*  BUN 46* 42* 50*  CREATININE 8.60* 8.50* 9.49*  ALBUMIN  --   --  2.8*  CALCIUM  --  8.4* 8.3*  PHOS  --   --  5.7*   Liver Function Tests: Recent Labs  Lab 03/16/22 0827  ALBUMIN 2.8*   No results for input(s): "LIPASE", "AMYLASE" in the last 168 hours. No results for input(s): "AMMONIA" in the last 168 hours. CBC: Recent Labs  Lab 03/13/22 1319 03/14/22 0046 03/16/22 0827  WBC  --  10.3 9.5  HGB 14.6 11.8* 9.4*  HCT 43.0 36.2 29.0*  MCV  --  94.3 95.7  PLT  --  178 141*   Cardiac Enzymes: No results for input(s): "CKTOTAL", "CKMB", "CKMBINDEX", "TROPONINI" in the last 168 hours. CBG: Recent Labs  Lab 03/13/22 1235 03/13/22 1727 03/14/22 1632  GLUCAP 82 120* 140*    Iron Studies: No results for input(s): "IRON", "TIBC", "TRANSFERRIN", "FERRITIN" in the last 72 hours. Studies/Results: VAS US DUPLEX DIALYSIS ACCESS (AVF, AVG)  Result Date: 03/17/2022 DIALYSIS ACCESS Patient Name:  Carrie [name]  Date of Exam:   03/16/2022 Medical Rec #:  [id number]         Accession #:    [id number] Date of Birth: 04-05-1951         Patient Gender: F Patient Age:   37 years Exam Location:  [facility] Procedure:      VAS US DUPLEX DIALYSIS ACCESS (AVF, AVG) Referring Phys: Rushie Nyhan --------------------------------------------------------------------------------  Reason for Exam: Unable to dialyze through AVF/AVG. Access Site: Left Upper Extremity. Access Type: AVF with interpositional graft. Comparison Study: 12/21/21 dialysis access duplex-The Left Brachial Axillary AVG                   appears to be patent throughout; Flow                   Volume appears to be normal. A Slight Stricture seen in the                   Mid Segment resulting in Increased velocities in the Mid                   segment. Performing Technologist: Maudry Mayhew MHA, RDMS, RVT, RDCS  Examination Guidelines: A complete  evaluation includes B-mode imaging, spectral Doppler, color Doppler, and power Doppler as needed of all accessible portions of each vessel. Unilateral testing is considered an integral part of a complete examination. Limited examinations for reoccurring indications may be performed as noted.  Findings:   +-------------------+----------+----------------+------------------------------+ AVG                PSV (cm/s)    Flow Vol               Describe                                             (mL/min)                                   +-------------------+----------+----------------+------------------------------+ Native artery         105          937                                      inflow                                                                      +-------------------+----------+----------------+------------------------------+ Arterial               73                                                   anastomosis                                                                  +-------------------+----------+----------------+------------------------------+ Prox graft            893                     stenotic and residual lumen                                                             <34m              +-------------------+----------+----------------+------------------------------+ Mid graft             388                               stenotic            +-------------------+----------+----------------+------------------------------+ Distal graft  153                                                   +-------------------+----------+----------------+------------------------------+ Venous anastomosis    146                                                   +-------------------+----------+----------------+------------------------------+ Venous outflow        219                                                   +-------------------+----------+----------------+------------------------------+  Summary: Arteriovenous graft-Residual lumen of less thatn 35m noted. Arteriovenous graft-Stenosis noted. *See table(s) above for measurements and observations.  Diagnosing physician: CMonica MartinezMD Electronically signed by CMonica MartinezMD on 03/17/2022 at 10:03:11 AM.   --------------------------------------------------------------------------------   Final     amLODipine  10 mg Oral Daily   aspirin  81 mg Oral BID   atorvastatin  40 mg Oral Daily   carvedilol  12.5 mg Oral BID WC   Chlorhexidine Gluconate Cloth  6 each Topical Q0600   docusate sodium  100 mg Oral BID   levETIRAcetam  1,000 mg Oral BID   lidocaine-prilocaine  1 Application Topical Q M,W,F-HD   losartan  100 mg Oral Daily   multivitamin  1 tablet Oral QHS   pantoprazole  40 mg Oral Daily   sevelamer carbonate  1,600 mg Oral TID WC   torsemide  20 mg Oral Daily    BMET    Component Value Date/Time   NA 134 (L) 03/16/2022 0827   K 4.1 03/16/2022 0827   CL 93 (L)  03/16/2022 0827   CO2 27 03/16/2022 0827   GLUCOSE 115 (H) 03/16/2022 0827   BUN 50 (H) 03/16/2022 0827   BUN 36 (A) 07/08/2017 0000   CREATININE 9.49 (H) 03/16/2022 0827   CALCIUM 8.3 (L) 03/16/2022 0827   GFRNONAA 4 (L) 03/16/2022 0827   GFRAA 9 (L) 11/04/2018 1208   CBC    Component Value Date/Time   WBC 9.5 03/16/2022 0827   RBC 3.03 (L) 03/16/2022 0827   HGB 9.4 (L) 03/16/2022 0827   HCT 29.0 (L) 03/16/2022 0827   PLT 141 (L) 03/16/2022 0827   MCV 95.7 03/16/2022 0827   MCH 31.0 03/16/2022 0827   MCHC 32.4 03/16/2022 0827   RDW 14.8 03/16/2022 0827   LYMPHSABS 1.2 05/01/2021 1043   MONOABS 0.4 05/01/2021 1043   EOSABS 0.1 05/01/2021 1043   BASOSABS 0.0 05/01/2021 1043    Dialysis Orders: Center: [facility] on MWF . EDW 99kg HD Bath 2K/2.5Ca  Time 3:30 Heparin none. 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  Doing well.  Vascular access malfunction - unable to cannulate AVG today.  Has 5 stents in place. Consulted IR for shuntogram, however duplex was performed which did show mid-graft area of stenosis with plans for shuntogram on Monday, however pt wants to follow up  with her regular vascular surgeon at [facility].  ESRD -  plan for HD on MWF schedule.  Will have to try again tomorrow after intervention of AVG.  Hypertension/volume  -  stable but below edw and will need to be changed at discharge.  Anemia  - stable, no ESA  Metabolic bone disease -   continue with home meds  Nutrition - renal diet, carb modified.  Disposition - for discharge today.  She does not want to have HD here and will follow up with her home unit.  She reports good fluid restriction.  Donetta Potts, MD [facility]

## 2022-03-19 ENCOUNTER — Telehealth: Payer: Self-pay | Admitting: *Deleted

## 2022-03-19 DIAGNOSIS — N186 End stage renal disease: Secondary | ICD-10-CM | POA: Diagnosis not present

## 2022-03-19 DIAGNOSIS — N2581 Secondary hyperparathyroidism of renal origin: Secondary | ICD-10-CM | POA: Diagnosis not present

## 2022-03-19 DIAGNOSIS — Z992 Dependence on renal dialysis: Secondary | ICD-10-CM | POA: Diagnosis not present

## 2022-03-19 NOTE — Telephone Encounter (Signed)
Ortho bundle D/C call completed. Patient discharged from hospital over the weekend.

## 2022-03-20 ENCOUNTER — Ambulatory Visit: Payer: Self-pay

## 2022-03-20 LAB — HEPATITIS B SURFACE ANTIGEN

## 2022-03-20 NOTE — Patient Outreach (Signed)
  Care Coordination   Follow Up Visit Note   03/20/2022 Name: MILTON STREICHER MRN: [id number] DOB: 06-May-1951  Golden Hurter Hedlund is a 71 y.o. year old female who sees Venia Carbon, MD for primary care. I spoke with  Golden Hurter Le[place]ille by phone today.  What matters to the patients health and wellness today?  Patient states she is doing well from her knee replacement surgery. She states she is moving around good with her walker.  She reports having physical therapy services with [facility] and does her exercises as instructed.  Patient states her pain has been manageable.  Per chart review next follow up visit with orthopedic surgeon is 03/27/22.  Patient states she is not wearing compression hose.    Goals Addressed             This Visit's Progress    Patient Stated:  I want to manage my knee pain and eventually have knee surgery."       Care Coordination Interventions: Evaluation of current treatment plan related to status post left knee arthroplasty and patient's adherence to plan as established by provider Reviewed medications with patient and discussed importance of compliance Reviewed scheduled/upcoming provider appointments  Discussed plans with patient for ongoing care management follow up and provided patient with direct contact information for care management team Patient advised to use ice, wear brace, use cane, as needed and elevate leg to reduce swelling.   Patient advised to take pain medication as instructed by provider.             SDOH assessments and interventions completed:  No     Care Coordination Interventions Activated:  Yes  Care Coordination Interventions:  Yes, provided   Follow up plan: Follow up call scheduled for 04/09/22 at 2:00 pm    Encounter Outcome:  Pt. Visit Completed   Quinn Plowman RN,BSN,CCM [place] [phone] direct line

## 2022-03-21 DIAGNOSIS — Z992 Dependence on renal dialysis: Secondary | ICD-10-CM | POA: Diagnosis not present

## 2022-03-21 DIAGNOSIS — N186 End stage renal disease: Secondary | ICD-10-CM | POA: Diagnosis not present

## 2022-03-21 DIAGNOSIS — N2581 Secondary hyperparathyroidism of renal origin: Secondary | ICD-10-CM | POA: Diagnosis not present

## 2022-03-23 DIAGNOSIS — N186 End stage renal disease: Secondary | ICD-10-CM | POA: Diagnosis not present

## 2022-03-23 DIAGNOSIS — N2581 Secondary hyperparathyroidism of renal origin: Secondary | ICD-10-CM | POA: Diagnosis not present

## 2022-03-23 DIAGNOSIS — Z992 Dependence on renal dialysis: Secondary | ICD-10-CM | POA: Diagnosis not present

## 2022-03-24 ENCOUNTER — Other Ambulatory Visit: Payer: Self-pay | Admitting: Orthopaedic Surgery

## 2022-03-26 DIAGNOSIS — Z992 Dependence on renal dialysis: Secondary | ICD-10-CM | POA: Diagnosis not present

## 2022-03-26 DIAGNOSIS — N186 End stage renal disease: Secondary | ICD-10-CM | POA: Diagnosis not present

## 2022-03-26 DIAGNOSIS — N2581 Secondary hyperparathyroidism of renal origin: Secondary | ICD-10-CM | POA: Diagnosis not present

## 2022-03-27 ENCOUNTER — Encounter: Payer: Self-pay | Admitting: Orthopaedic Surgery

## 2022-03-27 ENCOUNTER — Ambulatory Visit (INDEPENDENT_AMBULATORY_CARE_PROVIDER_SITE_OTHER): Payer: Medicare HMO | Admitting: Orthopaedic Surgery

## 2022-03-27 ENCOUNTER — Telehealth: Payer: Self-pay | Admitting: *Deleted

## 2022-03-27 DIAGNOSIS — Z96652 Presence of left artificial knee joint: Secondary | ICD-10-CM

## 2022-03-27 MED ORDER — OXYCODONE HCL 5 MG PO TABS: 5.0000 mg | ORAL_TABLET | ORAL | 0 refills | Status: DC | PRN

## 2022-03-27 NOTE — Telephone Encounter (Signed)
Ortho bundle 14 day in person meeting today. OPPT set up with Nicole Kindred PT for Friday, 03/30/22. Patient aware and all orders faxed.

## 2022-03-27 NOTE — Progress Notes (Signed)
The patient is 2 weeks status post a left total knee arthroplasty.  She says she is doing well.  She is in dialysis regularly.  She has been compliant with a baby aspirin twice daily.  Her left knee incision looks good.  The staples are removed and Steri-Strips applied.  Her calf is soft.  She has about full extension of her left knee but limitations in flexion to about 60 degrees.  I did stressed the importance of getting her knee flexing and bending.  She will transition outpatient physical therapy.  I did refill her oxycodone.  She will take a full-strength aspirin daily for 1 more week and then can stop aspirin.  We will see her back in 4 weeks to see how she is doing overall.

## 2022-03-28 DIAGNOSIS — Z992 Dependence on renal dialysis: Secondary | ICD-10-CM | POA: Diagnosis not present

## 2022-03-28 DIAGNOSIS — N186 End stage renal disease: Secondary | ICD-10-CM | POA: Diagnosis not present

## 2022-03-28 DIAGNOSIS — N2581 Secondary hyperparathyroidism of renal origin: Secondary | ICD-10-CM | POA: Diagnosis not present

## 2022-03-29 ENCOUNTER — Telehealth: Payer: Self-pay | Admitting: *Deleted

## 2022-03-29 NOTE — Telephone Encounter (Signed)
Error note

## 2022-03-30 DIAGNOSIS — N186 End stage renal disease: Secondary | ICD-10-CM | POA: Diagnosis not present

## 2022-03-30 DIAGNOSIS — Z992 Dependence on renal dialysis: Secondary | ICD-10-CM | POA: Diagnosis not present

## 2022-03-30 DIAGNOSIS — N2581 Secondary hyperparathyroidism of renal origin: Secondary | ICD-10-CM | POA: Diagnosis not present

## 2022-03-30 DIAGNOSIS — M25562 Pain in left knee: Secondary | ICD-10-CM | POA: Diagnosis not present

## 2022-04-02 ENCOUNTER — Encounter (INDEPENDENT_AMBULATORY_CARE_PROVIDER_SITE_OTHER): Payer: Self-pay

## 2022-04-02 DIAGNOSIS — N2581 Secondary hyperparathyroidism of renal origin: Secondary | ICD-10-CM | POA: Diagnosis not present

## 2022-04-02 DIAGNOSIS — Z992 Dependence on renal dialysis: Secondary | ICD-10-CM | POA: Diagnosis not present

## 2022-04-02 DIAGNOSIS — N186 End stage renal disease: Secondary | ICD-10-CM | POA: Diagnosis not present

## 2022-04-03 DIAGNOSIS — M25562 Pain in left knee: Secondary | ICD-10-CM | POA: Diagnosis not present

## 2022-04-03 DIAGNOSIS — N186 End stage renal disease: Secondary | ICD-10-CM | POA: Diagnosis not present

## 2022-04-03 DIAGNOSIS — Z992 Dependence on renal dialysis: Secondary | ICD-10-CM | POA: Diagnosis not present

## 2022-04-04 DIAGNOSIS — N186 End stage renal disease: Secondary | ICD-10-CM | POA: Diagnosis not present

## 2022-04-04 DIAGNOSIS — N2581 Secondary hyperparathyroidism of renal origin: Secondary | ICD-10-CM | POA: Diagnosis not present

## 2022-04-04 DIAGNOSIS — Z992 Dependence on renal dialysis: Secondary | ICD-10-CM | POA: Diagnosis not present

## 2022-04-05 DIAGNOSIS — M25562 Pain in left knee: Secondary | ICD-10-CM | POA: Diagnosis not present

## 2022-04-06 DIAGNOSIS — N186 End stage renal disease: Secondary | ICD-10-CM | POA: Diagnosis not present

## 2022-04-06 DIAGNOSIS — Z992 Dependence on renal dialysis: Secondary | ICD-10-CM | POA: Diagnosis not present

## 2022-04-06 DIAGNOSIS — N2581 Secondary hyperparathyroidism of renal origin: Secondary | ICD-10-CM | POA: Diagnosis not present

## 2022-04-09 DIAGNOSIS — Z992 Dependence on renal dialysis: Secondary | ICD-10-CM | POA: Diagnosis not present

## 2022-04-09 DIAGNOSIS — N2581 Secondary hyperparathyroidism of renal origin: Secondary | ICD-10-CM | POA: Diagnosis not present

## 2022-04-09 DIAGNOSIS — N186 End stage renal disease: Secondary | ICD-10-CM | POA: Diagnosis not present

## 2022-04-10 DIAGNOSIS — M25562 Pain in left knee: Secondary | ICD-10-CM | POA: Diagnosis not present

## 2022-04-11 ENCOUNTER — Ambulatory Visit: Payer: Self-pay

## 2022-04-11 DIAGNOSIS — Z992 Dependence on renal dialysis: Secondary | ICD-10-CM | POA: Diagnosis not present

## 2022-04-11 DIAGNOSIS — N186 End stage renal disease: Secondary | ICD-10-CM | POA: Diagnosis not present

## 2022-04-11 DIAGNOSIS — N2581 Secondary hyperparathyroidism of renal origin: Secondary | ICD-10-CM | POA: Diagnosis not present

## 2022-04-11 NOTE — Patient Outreach (Signed)
  Care Coordination   Follow Up Visit Note   04/11/2022 Name: Carrie [name] MRN: [id number] DOB: 06-03-1951  Carrie [name] is a 71 y.o. year old female who sees Venia Carbon, MD for primary care. I spoke with  Carrie Hurter Dapolito by phone today.  What matters to the patients health and wellness today?  Patient states she is doing well.  She reports having physical therapy as outpatient now.  She states she is progressing with her range of motion.  Patient states her pain level has been very well controlled so she only takes a tylenol occasionally.  Patient states she doesn't have much of an appetite so her weight has dropped some.  She states her dietician met with her today at dialysis.  Patient states she was advised by her Orthopedic doctor to follow up in 4 weeks.    Goals Addressed             This Visit's Progress    Patient Stated:  I want to manage my knee pain and eventually have knee surgery."       Care Coordination Interventions: Evaluation of current treatment plan related to status post left knee arthroplasty and patient's adherence to plan as established by provider Reviewed medications with patient and discussed importance of compliance Reviewed scheduled/upcoming provider appointments  Congratulated patient on having successful knee replacement surgery.  Discussed need to continue to use ambulatory device as instructed by PT.   Advised to continue knee exercises on non therapy days.  Advised to continue to use ice, wear brace, use cane, as needed and elevate leg to reduce swelling.   Advised to notify provider for any new or ongoing symptoms.  Discussed fall precautions            SDOH assessments and interventions completed:  No     Care Coordination Interventions Activated:  Yes  Care Coordination Interventions:  Yes, provided   Follow up plan: Follow up call scheduled for 06/26/21 at 11:00 am    Encounter Outcome:  Pt. Visit Completed   Quinn Plowman RN,BSN,CCM [place] [phone] direct line

## 2022-04-13 DIAGNOSIS — Z992 Dependence on renal dialysis: Secondary | ICD-10-CM | POA: Diagnosis not present

## 2022-04-13 DIAGNOSIS — N186 End stage renal disease: Secondary | ICD-10-CM | POA: Diagnosis not present

## 2022-04-13 DIAGNOSIS — N2581 Secondary hyperparathyroidism of renal origin: Secondary | ICD-10-CM | POA: Diagnosis not present

## 2022-04-16 DIAGNOSIS — N2581 Secondary hyperparathyroidism of renal origin: Secondary | ICD-10-CM | POA: Diagnosis not present

## 2022-04-16 DIAGNOSIS — Z992 Dependence on renal dialysis: Secondary | ICD-10-CM | POA: Diagnosis not present

## 2022-04-16 DIAGNOSIS — N186 End stage renal disease: Secondary | ICD-10-CM | POA: Diagnosis not present

## 2022-04-17 DIAGNOSIS — M25562 Pain in left knee: Secondary | ICD-10-CM | POA: Diagnosis not present

## 2022-04-18 DIAGNOSIS — N186 End stage renal disease: Secondary | ICD-10-CM | POA: Diagnosis not present

## 2022-04-18 DIAGNOSIS — Z992 Dependence on renal dialysis: Secondary | ICD-10-CM | POA: Diagnosis not present

## 2022-04-18 DIAGNOSIS — N2581 Secondary hyperparathyroidism of renal origin: Secondary | ICD-10-CM | POA: Diagnosis not present

## 2022-04-19 DIAGNOSIS — M25562 Pain in left knee: Secondary | ICD-10-CM | POA: Diagnosis not present

## 2022-04-20 DIAGNOSIS — Z992 Dependence on renal dialysis: Secondary | ICD-10-CM | POA: Diagnosis not present

## 2022-04-20 DIAGNOSIS — N2581 Secondary hyperparathyroidism of renal origin: Secondary | ICD-10-CM | POA: Diagnosis not present

## 2022-04-20 DIAGNOSIS — N186 End stage renal disease: Secondary | ICD-10-CM | POA: Diagnosis not present

## 2022-04-23 DIAGNOSIS — N2581 Secondary hyperparathyroidism of renal origin: Secondary | ICD-10-CM | POA: Diagnosis not present

## 2022-04-23 DIAGNOSIS — Z992 Dependence on renal dialysis: Secondary | ICD-10-CM | POA: Diagnosis not present

## 2022-04-23 DIAGNOSIS — N186 End stage renal disease: Secondary | ICD-10-CM | POA: Diagnosis not present

## 2022-04-25 DIAGNOSIS — N186 End stage renal disease: Secondary | ICD-10-CM | POA: Diagnosis not present

## 2022-04-25 DIAGNOSIS — N2581 Secondary hyperparathyroidism of renal origin: Secondary | ICD-10-CM | POA: Diagnosis not present

## 2022-04-25 DIAGNOSIS — Z992 Dependence on renal dialysis: Secondary | ICD-10-CM | POA: Diagnosis not present

## 2022-04-27 DIAGNOSIS — N2581 Secondary hyperparathyroidism of renal origin: Secondary | ICD-10-CM | POA: Diagnosis not present

## 2022-04-27 DIAGNOSIS — N186 End stage renal disease: Secondary | ICD-10-CM | POA: Diagnosis not present

## 2022-04-27 DIAGNOSIS — Z992 Dependence on renal dialysis: Secondary | ICD-10-CM | POA: Diagnosis not present

## 2022-04-30 ENCOUNTER — Telehealth: Payer: Self-pay

## 2022-04-30 DIAGNOSIS — N2581 Secondary hyperparathyroidism of renal origin: Secondary | ICD-10-CM | POA: Diagnosis not present

## 2022-04-30 DIAGNOSIS — N186 End stage renal disease: Secondary | ICD-10-CM | POA: Diagnosis not present

## 2022-04-30 DIAGNOSIS — Z992 Dependence on renal dialysis: Secondary | ICD-10-CM | POA: Diagnosis not present

## 2022-04-30 NOTE — Patient Outreach (Signed)
  Care Coordination   Follow Up Visit Note   04/30/2022 Name: NAW LASALA MRN: [id number] DOB: 17-May-1951  Golden Hurter Grattan is a 71 y.o. year old female who sees Venia Carbon, MD for primary care. I spoke with  Golden Hurter Caponi by phone today.  What matters to the patients health and wellness today?  Patient states she is requesting help with getting the oxygen equipment she no longer uses picked up from her home by [employer] [facility].   She states she has been paying for the equipment monthly but has not used in months.   Patient states she contacted [employer] and was informed that a release letter would need to be received from her primary care provider before they would pick up equipment.     Goals Addressed             This Visit's Progress    Patient StatedL:  Assistance with medical equipment removal       Care Coordination Interventions: Message sent to patients primary care provider requesting release letter be sent to [facility] regarding patients home oxygen equipment.         SDOH assessments and interventions completed:  Yes     Care Coordination Interventions:  Yes, provided   Follow up plan: as previously scheduled.  Encounter Outcome:  Pt. Visit Completed   Quinn Plowman RN,BSN,CCM [place] [phone] direct line

## 2022-05-01 ENCOUNTER — Telehealth: Payer: Self-pay

## 2022-05-01 ENCOUNTER — Encounter: Payer: Self-pay | Admitting: Orthopaedic Surgery

## 2022-05-01 ENCOUNTER — Ambulatory Visit (INDEPENDENT_AMBULATORY_CARE_PROVIDER_SITE_OTHER): Payer: Medicare HMO | Admitting: Orthopaedic Surgery

## 2022-05-01 DIAGNOSIS — Z96652 Presence of left artificial knee joint: Secondary | ICD-10-CM

## 2022-05-01 NOTE — Progress Notes (Signed)
The patient is a 71 year old who is very active.  She is now 6 weeks status post a left total knee arthroplasty.  She reports good range of motion and strength.  She is ambulate with a walker but doing great and has a great attitude.  She is not on any pain medication.  Her extension is full of her left operative knee and her flexion is almost full.  It feels ligamentously stable on exam as well.  I am very pleased overall.  The next time I need to see her is not for 3 months unless she is having issues.  Will have standing AP and lateral of her left knee at that visit.  All questions and concerns were answered and addressed.

## 2022-05-01 NOTE — Patient Outreach (Signed)
  Care Coordination   Follow Up Visit Note   05/01/2022 Name: Carrie [name] MRN: [id number] DOB: 24-Sep-1950  Carrie [name] is a 71 y.o. year old female who sees Venia Carbon, MD for primary care. I spoke with  Carrie [name] by phone today.  What matters to the patients health and wellness today?  Call received from patient stating she had a great follow up visit with her orthopedic surgeon.  She states she is scheduled to follow up with surgeon in 3 months.   Patient states she contacted [employer] and they have not received the release/ discontinue letter from her primary care provider regarding her oxygen equipment.   Message sent to Beatriz Stallion, CMA that [employer] has not received release/ discontinue letter regarding patients oxygen.  Larene Beach stated she would fax it again.    Goals Addressed             This Visit's Progress    Patient StatedL:  Assistance with medical equipment removal       Care Coordination Interventions: Message sent to Beatriz Stallion with patients primary care provider office informing her that [employer] did not receive the release/ discontinue letter for patients oxygen equipment.          SDOH assessments and interventions completed:  No     Care Coordination Interventions:  Yes, provided   Follow up plan:  as previously scheduled    Encounter Outcome:  Pt. Visit Completed {THN Tip this will not be part of the note when signed-REQUIRED REPORT FIELD DO NOT DELETE (Optional):[id number]  Quinn Plowman RN,BSN,CCM [place] [phone] direct line

## 2022-05-02 ENCOUNTER — Telehealth: Payer: Self-pay

## 2022-05-02 DIAGNOSIS — N2581 Secondary hyperparathyroidism of renal origin: Secondary | ICD-10-CM | POA: Diagnosis not present

## 2022-05-02 DIAGNOSIS — Z992 Dependence on renal dialysis: Secondary | ICD-10-CM | POA: Diagnosis not present

## 2022-05-02 DIAGNOSIS — N186 End stage renal disease: Secondary | ICD-10-CM | POA: Diagnosis not present

## 2022-05-02 NOTE — Patient Outreach (Signed)
  Care Coordination   Follow Up Visit Note   05/02/2022 Name: Carrie [name] MRN: [id number] DOB: May 09, 1951  Carrie [name] is a 71 y.o. year old female who sees Venia Carbon, MD for primary care.   What matters to the patients health and wellness today?  Incoming message received from patient stating her oxygen equipment was picked up by [facility] [facility] on yesterday.  Patient voiced appreciation with assisting her with this matter.      SDOH assessments and interventions completed:  No     Care Coordination Interventions:  No, not indicated   Follow up plan:  as previously scheduled    Encounter Outcome:  Pt. Visit Completed   Quinn Plowman RN,BSN,CCM [place] [phone] direct line

## 2022-05-03 DIAGNOSIS — Z992 Dependence on renal dialysis: Secondary | ICD-10-CM | POA: Diagnosis not present

## 2022-05-03 DIAGNOSIS — N186 End stage renal disease: Secondary | ICD-10-CM | POA: Diagnosis not present

## 2022-05-03 DIAGNOSIS — M25562 Pain in left knee: Secondary | ICD-10-CM | POA: Diagnosis not present

## 2022-05-04 DIAGNOSIS — Z992 Dependence on renal dialysis: Secondary | ICD-10-CM | POA: Diagnosis not present

## 2022-05-04 DIAGNOSIS — N2581 Secondary hyperparathyroidism of renal origin: Secondary | ICD-10-CM | POA: Diagnosis not present

## 2022-05-04 DIAGNOSIS — N186 End stage renal disease: Secondary | ICD-10-CM | POA: Diagnosis not present

## 2022-05-07 DIAGNOSIS — Z992 Dependence on renal dialysis: Secondary | ICD-10-CM | POA: Diagnosis not present

## 2022-05-07 DIAGNOSIS — N2581 Secondary hyperparathyroidism of renal origin: Secondary | ICD-10-CM | POA: Diagnosis not present

## 2022-05-07 DIAGNOSIS — N186 End stage renal disease: Secondary | ICD-10-CM | POA: Diagnosis not present

## 2022-05-08 DIAGNOSIS — M25562 Pain in left knee: Secondary | ICD-10-CM | POA: Diagnosis not present

## 2022-05-09 DIAGNOSIS — Z992 Dependence on renal dialysis: Secondary | ICD-10-CM | POA: Diagnosis not present

## 2022-05-09 DIAGNOSIS — N186 End stage renal disease: Secondary | ICD-10-CM | POA: Diagnosis not present

## 2022-05-09 DIAGNOSIS — N2581 Secondary hyperparathyroidism of renal origin: Secondary | ICD-10-CM | POA: Diagnosis not present

## 2022-05-11 DIAGNOSIS — N186 End stage renal disease: Secondary | ICD-10-CM | POA: Diagnosis not present

## 2022-05-11 DIAGNOSIS — N2581 Secondary hyperparathyroidism of renal origin: Secondary | ICD-10-CM | POA: Diagnosis not present

## 2022-05-11 DIAGNOSIS — Z992 Dependence on renal dialysis: Secondary | ICD-10-CM | POA: Diagnosis not present

## 2022-05-14 DIAGNOSIS — N2581 Secondary hyperparathyroidism of renal origin: Secondary | ICD-10-CM | POA: Diagnosis not present

## 2022-05-14 DIAGNOSIS — N186 End stage renal disease: Secondary | ICD-10-CM | POA: Diagnosis not present

## 2022-05-14 DIAGNOSIS — Z992 Dependence on renal dialysis: Secondary | ICD-10-CM | POA: Diagnosis not present

## 2022-05-15 ENCOUNTER — Other Ambulatory Visit: Payer: Self-pay | Admitting: Internal Medicine

## 2022-05-15 DIAGNOSIS — M25562 Pain in left knee: Secondary | ICD-10-CM | POA: Diagnosis not present

## 2022-05-15 NOTE — Telephone Encounter (Signed)
Last filled 03-01-22 #60 Last OV 02-20-22 Next OV 02-26-23 [facility]

## 2022-05-16 DIAGNOSIS — N2581 Secondary hyperparathyroidism of renal origin: Secondary | ICD-10-CM | POA: Diagnosis not present

## 2022-05-16 DIAGNOSIS — Z992 Dependence on renal dialysis: Secondary | ICD-10-CM | POA: Diagnosis not present

## 2022-05-16 DIAGNOSIS — N186 End stage renal disease: Secondary | ICD-10-CM | POA: Diagnosis not present

## 2022-05-17 DIAGNOSIS — M25562 Pain in left knee: Secondary | ICD-10-CM | POA: Diagnosis not present

## 2022-05-18 DIAGNOSIS — Z992 Dependence on renal dialysis: Secondary | ICD-10-CM | POA: Diagnosis not present

## 2022-05-18 DIAGNOSIS — N2581 Secondary hyperparathyroidism of renal origin: Secondary | ICD-10-CM | POA: Diagnosis not present

## 2022-05-18 DIAGNOSIS — N186 End stage renal disease: Secondary | ICD-10-CM | POA: Diagnosis not present

## 2022-05-21 DIAGNOSIS — Z992 Dependence on renal dialysis: Secondary | ICD-10-CM | POA: Diagnosis not present

## 2022-05-21 DIAGNOSIS — N186 End stage renal disease: Secondary | ICD-10-CM | POA: Diagnosis not present

## 2022-05-21 DIAGNOSIS — N2581 Secondary hyperparathyroidism of renal origin: Secondary | ICD-10-CM | POA: Diagnosis not present

## 2022-05-23 DIAGNOSIS — N2581 Secondary hyperparathyroidism of renal origin: Secondary | ICD-10-CM | POA: Diagnosis not present

## 2022-05-23 DIAGNOSIS — Z992 Dependence on renal dialysis: Secondary | ICD-10-CM | POA: Diagnosis not present

## 2022-05-23 DIAGNOSIS — N186 End stage renal disease: Secondary | ICD-10-CM | POA: Diagnosis not present

## 2022-05-24 DIAGNOSIS — M25562 Pain in left knee: Secondary | ICD-10-CM | POA: Diagnosis not present

## 2022-05-25 DIAGNOSIS — N2581 Secondary hyperparathyroidism of renal origin: Secondary | ICD-10-CM | POA: Diagnosis not present

## 2022-05-25 DIAGNOSIS — Z992 Dependence on renal dialysis: Secondary | ICD-10-CM | POA: Diagnosis not present

## 2022-05-25 DIAGNOSIS — N186 End stage renal disease: Secondary | ICD-10-CM | POA: Diagnosis not present

## 2022-05-27 DIAGNOSIS — Z992 Dependence on renal dialysis: Secondary | ICD-10-CM | POA: Diagnosis not present

## 2022-05-27 DIAGNOSIS — N2581 Secondary hyperparathyroidism of renal origin: Secondary | ICD-10-CM | POA: Diagnosis not present

## 2022-05-27 DIAGNOSIS — N186 End stage renal disease: Secondary | ICD-10-CM | POA: Diagnosis not present

## 2022-05-30 DIAGNOSIS — N186 End stage renal disease: Secondary | ICD-10-CM | POA: Diagnosis not present

## 2022-05-30 DIAGNOSIS — Z992 Dependence on renal dialysis: Secondary | ICD-10-CM | POA: Diagnosis not present

## 2022-05-30 DIAGNOSIS — N2581 Secondary hyperparathyroidism of renal origin: Secondary | ICD-10-CM | POA: Diagnosis not present

## 2022-06-01 DIAGNOSIS — Z992 Dependence on renal dialysis: Secondary | ICD-10-CM | POA: Diagnosis not present

## 2022-06-01 DIAGNOSIS — N2581 Secondary hyperparathyroidism of renal origin: Secondary | ICD-10-CM | POA: Diagnosis not present

## 2022-06-01 DIAGNOSIS — N186 End stage renal disease: Secondary | ICD-10-CM | POA: Diagnosis not present

## 2022-06-03 DIAGNOSIS — N2581 Secondary hyperparathyroidism of renal origin: Secondary | ICD-10-CM | POA: Diagnosis not present

## 2022-06-03 DIAGNOSIS — Z992 Dependence on renal dialysis: Secondary | ICD-10-CM | POA: Diagnosis not present

## 2022-06-03 DIAGNOSIS — N186 End stage renal disease: Secondary | ICD-10-CM | POA: Diagnosis not present

## 2022-06-04 DIAGNOSIS — Z992 Dependence on renal dialysis: Secondary | ICD-10-CM | POA: Diagnosis not present

## 2022-06-04 DIAGNOSIS — N2581 Secondary hyperparathyroidism of renal origin: Secondary | ICD-10-CM | POA: Diagnosis not present

## 2022-06-04 DIAGNOSIS — N186 End stage renal disease: Secondary | ICD-10-CM | POA: Diagnosis not present

## 2022-06-06 DIAGNOSIS — Z992 Dependence on renal dialysis: Secondary | ICD-10-CM | POA: Diagnosis not present

## 2022-06-06 DIAGNOSIS — N2581 Secondary hyperparathyroidism of renal origin: Secondary | ICD-10-CM | POA: Diagnosis not present

## 2022-06-06 DIAGNOSIS — N186 End stage renal disease: Secondary | ICD-10-CM | POA: Diagnosis not present

## 2022-06-08 DIAGNOSIS — Z992 Dependence on renal dialysis: Secondary | ICD-10-CM | POA: Diagnosis not present

## 2022-06-08 DIAGNOSIS — N186 End stage renal disease: Secondary | ICD-10-CM | POA: Diagnosis not present

## 2022-06-08 DIAGNOSIS — N2581 Secondary hyperparathyroidism of renal origin: Secondary | ICD-10-CM | POA: Diagnosis not present

## 2022-06-11 DIAGNOSIS — N2581 Secondary hyperparathyroidism of renal origin: Secondary | ICD-10-CM | POA: Diagnosis not present

## 2022-06-11 DIAGNOSIS — N186 End stage renal disease: Secondary | ICD-10-CM | POA: Diagnosis not present

## 2022-06-11 DIAGNOSIS — Z992 Dependence on renal dialysis: Secondary | ICD-10-CM | POA: Diagnosis not present

## 2022-06-13 DIAGNOSIS — Z992 Dependence on renal dialysis: Secondary | ICD-10-CM | POA: Diagnosis not present

## 2022-06-13 DIAGNOSIS — N186 End stage renal disease: Secondary | ICD-10-CM | POA: Diagnosis not present

## 2022-06-13 DIAGNOSIS — N2581 Secondary hyperparathyroidism of renal origin: Secondary | ICD-10-CM | POA: Diagnosis not present

## 2022-06-14 ENCOUNTER — Other Ambulatory Visit (INDEPENDENT_AMBULATORY_CARE_PROVIDER_SITE_OTHER): Payer: Self-pay | Admitting: Nurse Practitioner

## 2022-06-14 DIAGNOSIS — N186 End stage renal disease: Secondary | ICD-10-CM

## 2022-06-14 DIAGNOSIS — M25562 Pain in left knee: Secondary | ICD-10-CM | POA: Diagnosis not present

## 2022-06-15 DIAGNOSIS — Z992 Dependence on renal dialysis: Secondary | ICD-10-CM | POA: Diagnosis not present

## 2022-06-15 DIAGNOSIS — N2581 Secondary hyperparathyroidism of renal origin: Secondary | ICD-10-CM | POA: Diagnosis not present

## 2022-06-15 DIAGNOSIS — N186 End stage renal disease: Secondary | ICD-10-CM | POA: Diagnosis not present

## 2022-06-18 DIAGNOSIS — N2581 Secondary hyperparathyroidism of renal origin: Secondary | ICD-10-CM | POA: Diagnosis not present

## 2022-06-18 DIAGNOSIS — Z992 Dependence on renal dialysis: Secondary | ICD-10-CM | POA: Diagnosis not present

## 2022-06-18 DIAGNOSIS — N186 End stage renal disease: Secondary | ICD-10-CM | POA: Diagnosis not present

## 2022-06-19 NOTE — Progress Notes (Deleted)
MRN : [id number]  Carrie [name] is a 72 y.o. (Oct 02, 1950) female who presents with chief complaint of check access.  History of Present Illness:  The patient returns to the office for followup status post intervention of their dialysis access 11/14/2021.    Following the intervention the access function has significantly improved, with better flow rates and improved KT/V. The patient has not been experiencing increased bleeding times following decannulation and the patient denies increased recirculation. The patient denies an increase in arm swelling. At the present time the patient denies hand pain.   No recent shortening of the patient's walking distance or new symptoms consistent with claudication.  No history of rest pain symptoms. No new ulcers or wounds of the lower extremities have occurred.   The patient denies amaurosis fugax or recent TIA symptoms. There are no recent neurological changes noted. There is no history of DVT, PE or superficial thrombophlebitis. No recent episodes of angina or shortness of breath documented.    Duplex ultrasound of the AV access shows a patent access.  The previously noted stenosis is improved compared to last study.  Flow volume today is 1296 cc/min (previous flow volume was 2810 cc/min)  No outpatient medications have been marked as taking for the 06/21/22 encounter (Appointment) with Delana Meyer, Dolores Lory, MD.    Past Medical History:  Diagnosis Date   Allergy    Anemia    Arthritis    "right knee" (10/26'/2017)   Chronic heart failure with preserved ejection fraction (HFpEF) ([place])    a. 04/2021 Echo: EF >55%, GrI DD, nl RV fxn, triv MR; b. 12/2021 Echo: EF 60-65%, no rwma.   Demand ischemia    a. 04/2021 HsTrop up to 73 in setting of volume overload; b. 10/2021 MV: EF 66% with small, mild reversible defect in the apical septal and mid anteroseptal myocardium which was felt to most likely represent artifact.    Depression    ESRD (end stage renal disease) ([place])    a. OnHD (Dr. Lavonia Dana)   History of blood transfusion 2008   "when I had brain tumor surgery"   History of MRSA infection 2008   History of stress test    a. 07/2018 MV: EF 66%, no ischemia/infarct.   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 12/2021 Echo: EF 60-65%, no rwma, mild LVH, nl RV fxn, small pericardial effusion - small pocket of post wall, ~ 1cm.   Precordial chest pain    a. 10/2021 MV: low risk.   Type II diabetes mellitus ([place]) 2011   on metformin until yr ago- no med now - off due to kidneys    Past Surgical History:  Procedure Laterality Date   A/V FISTULAGRAM Left 03/10/2019   Procedure: A/V FISTULAGRAM;  Surgeon: Katha Cabal, MD;  Location: [place] [facility];  Service: Cardiovascular;  Laterality: Left;   A/V FISTULAGRAM Left 07/14/2019   Procedure: A/V FISTULAGRAM;  Surgeon: Katha Cabal, MD;  Location: [place] [facility];  Service: Cardiovascular;  Laterality: Left;   A/V FISTULAGRAM Left 01/07/2020   Procedure: A/V FISTULAGRAM;  Surgeon: Katha Cabal, MD;  Location: [place] [facility];  Service: Cardiovascular;  Laterality: Left;   A/V FISTULAGRAM Left 11/14/2021   Procedure: A/V Fistulagram;  Surgeon: [name]  G, MD;  Location: [place] [facility];  Service: Cardiovascular;  Laterality: Left;   AV FISTULA PLACEMENT Left 11/07/2018   Procedure: ARTERIOVENOUS (AV) FISTULA CREATION ( BRACHIO BASILIC ) VS BRACHIAL AXILLARY GRAFT;  Surgeon: Katha Cabal, MD;  Location: [facility];  Service: Vascular;  Laterality: Left;   BRAIN MENINGIOMA EXCISION  08/2006   Foramina magnum meningioma   CERVICAL DISCECTOMY  1996   Dr. Alric Seton   CESAREAN SECTION  1981   COLONOSCOPY     COLONOSCOPY WITH PROPOFOL N/A 03/07/2021   Procedure: COLONOSCOPY WITH PROPOFOL;  Surgeon: Virgel Manifold, MD;  Location: [facility];  Service: Endoscopy;   Laterality: N/A;   DIALYSIS/PERMA CATHETER INSERTION N/A 08/04/2018   Procedure: DIALYSIS/PERMA CATHETER INSERTION;  Surgeon: Algernon Huxley, MD;  Location: [place] [facility];  Service: Cardiovascular;  Laterality: N/A;   DIALYSIS/PERMA CATHETER REMOVAL N/A 12/25/2018   Procedure: DIALYSIS/PERMA CATHETER REMOVAL;  Surgeon: Algernon Huxley, MD;  Location: [place] [facility];  Service: Cardiovascular;  Laterality: N/A;   DILATION AND CURETTAGE OF UTERUS     JOINT REPLACEMENT     right knee left hip   Right wrist x-ray  2007   Deg. at 1st MCP   TOTAL HIP ARTHROPLASTY Left 03/27/2016   Procedure: LEFT TOTAL HIP ARTHROPLASTY ANTERIOR APPROACH;  Surgeon: Mcarthur Rossetti, MD;  Location: [place];  Service: Orthopedics;  Laterality: Left;   TOTAL KNEE ARTHROPLASTY Right 04/30/2017   Procedure: RIGHT TOTAL KNEE ARTHROPLASTY;  Surgeon: Mcarthur Rossetti, MD;  Location: [place];  Service: Orthopedics;  Laterality: Right;   TOTAL KNEE ARTHROPLASTY Left 03/13/2022   Procedure: LEFT TOTAL KNEE ARTHROPLASTY;  Surgeon: Mcarthur Rossetti, MD;  Location: [place];  Service: Orthopedics;  Laterality: Left;   TUBAL LIGATION  1981    Social History Social History   Tobacco Use   Smoking status: Never    Passive exposure: Past   Smokeless tobacco: Never  Vaping Use   Vaping Use: Never used  Substance Use Topics   Alcohol use: No    Alcohol/week: 0.0 standard drinks of alcohol   Drug use: No    Family History Family History  Problem Relation Age of Onset   Hypertension Other        Strong family history    Breast cancer Mother 41   Heart failure Sister    Heart disease Sister     Allergies  Allergen Reactions   Naproxen Sodium Anaphylaxis and Swelling   Sulfamethoxazole-Trimethoprim Anaphylaxis and Swelling     REVIEW OF SYSTEMS (Negative unless checked)  Constitutional: []$ Weight loss  []$ Fever  []$ Chills Cardiac: []$ Chest pain   []$ Chest pressure   []$ Palpitations   []$ Shortness of  breath when laying flat   []$ Shortness of breath with exertion. Vascular:  []$ Pain in legs with walking   []$ Pain in legs at rest  []$ History of DVT   []$ Phlebitis   []$ Swelling in legs   []$ Varicose veins   []$ Non-healing ulcers Pulmonary:   []$ Uses home oxygen   []$ Productive cough   []$ Hemoptysis   []$ Wheeze  []$ COPD   []$ Asthma Neurologic:  []$ Dizziness   []$ Seizures   []$ History of stroke   []$ History of TIA  []$ Aphasia   []$ Vissual changes   []$ Weakness or numbness in arm   []$ Weakness or numbness in leg Musculoskeletal:   []$ Joint swelling   []$ Joint pain   []$ Low back pain Hematologic:  []$ Easy bruising  []$ Easy bleeding   []$ Hypercoagulable state   []$ Anemic Gastrointestinal:  []$ Diarrhea   []$   Vomiting  []$ Gastroesophageal reflux/heartburn   []$ Difficulty swallowing. Genitourinary:  [x]$ Chronic kidney disease   []$ Difficult urination  []$ Frequent urination   []$ Blood in urine Skin:  []$ Rashes   []$ Ulcers  Psychological:  []$ History of anxiety   []$  History of major depression.  Physical Examination  There were no vitals filed for this visit. There is no height or weight on file to calculate BMI. Gen: WD/WN, NAD Head: [place]/AT, No temporalis wasting.  Ear/Nose/Throat: Hearing grossly intact, nares w/o erythema or drainage Eyes: PER, EOMI, sclera nonicteric.  Neck: Supple, no gross masses or lesions.  No JVD.  Pulmonary:  Good air movement, no audible wheezing, no use of accessory muscles.  Cardiac: RRR, precordium non-hyperdynamic. Vascular:   *** Vessel Right Left  Radial Palpable Palpable  Brachial Palpable Palpable  Gastrointestinal: soft, non-distended. No guarding/no peritoneal signs.  Musculoskeletal: M/S 5/5 throughout.  No deformity.  Neurologic: CN 2-12 intact. Pain and light touch intact in extremities.  Symmetrical.  Speech is fluent. Motor exam as listed above. Psychiatric: Judgment intact, Mood & affect appropriate for pt's clinical situation. Dermatologic: No rashes or ulcers noted.  No changes consistent  with cellulitis.   CBC Lab Results  Component Value Date   WBC 9.5 03/16/2022   HGB 9.4 (L) 03/16/2022   HCT 29.0 (L) 03/16/2022   MCV 95.7 03/16/2022   PLT 141 (L) 03/16/2022    BMET    Component Value Date/Time   NA 134 (L) 03/16/2022 0827   K 4.1 03/16/2022 0827   CL 93 (L) 03/16/2022 0827   CO2 27 03/16/2022 0827   GLUCOSE 115 (H) 03/16/2022 0827   BUN 50 (H) 03/16/2022 0827   BUN 36 (A) 07/08/2017 0000   CREATININE 9.49 (H) 03/16/2022 0827   CALCIUM 8.3 (L) 03/16/2022 0827   GFRNONAA 4 (L) 03/16/2022 0827   GFRAA 9 (L) 11/04/2018 1208   CrCl cannot be calculated (Patient's most recent lab result is older than the maximum 21 days allowed.).  COAG Lab Results  Component Value Date   INR 1.0 05/01/2021   INR 1.0 11/04/2018   INR 1.61 05/03/2017    Radiology No results found.   Assessment/Plan There are no diagnoses linked to this encounter.   [name], MD  06/19/2022 1:48 PM

## 2022-06-20 DIAGNOSIS — Z992 Dependence on renal dialysis: Secondary | ICD-10-CM | POA: Diagnosis not present

## 2022-06-20 DIAGNOSIS — N2581 Secondary hyperparathyroidism of renal origin: Secondary | ICD-10-CM | POA: Diagnosis not present

## 2022-06-20 DIAGNOSIS — E119 Type 2 diabetes mellitus without complications: Secondary | ICD-10-CM | POA: Diagnosis not present

## 2022-06-20 DIAGNOSIS — N186 End stage renal disease: Secondary | ICD-10-CM | POA: Diagnosis not present

## 2022-06-21 ENCOUNTER — Ambulatory Visit (INDEPENDENT_AMBULATORY_CARE_PROVIDER_SITE_OTHER): Payer: Medicare HMO

## 2022-06-21 ENCOUNTER — Ambulatory Visit (INDEPENDENT_AMBULATORY_CARE_PROVIDER_SITE_OTHER): Payer: Medicare HMO | Admitting: Vascular Surgery

## 2022-06-21 DIAGNOSIS — E785 Hyperlipidemia, unspecified: Secondary | ICD-10-CM

## 2022-06-21 DIAGNOSIS — M170 Bilateral primary osteoarthritis of knee: Secondary | ICD-10-CM

## 2022-06-21 DIAGNOSIS — N186 End stage renal disease: Secondary | ICD-10-CM

## 2022-06-21 DIAGNOSIS — I10 Essential (primary) hypertension: Secondary | ICD-10-CM

## 2022-06-22 DIAGNOSIS — Z992 Dependence on renal dialysis: Secondary | ICD-10-CM | POA: Diagnosis not present

## 2022-06-22 DIAGNOSIS — N186 End stage renal disease: Secondary | ICD-10-CM | POA: Diagnosis not present

## 2022-06-22 DIAGNOSIS — N2581 Secondary hyperparathyroidism of renal origin: Secondary | ICD-10-CM | POA: Diagnosis not present

## 2022-06-25 DIAGNOSIS — Z992 Dependence on renal dialysis: Secondary | ICD-10-CM | POA: Diagnosis not present

## 2022-06-25 DIAGNOSIS — N186 End stage renal disease: Secondary | ICD-10-CM | POA: Diagnosis not present

## 2022-06-25 DIAGNOSIS — N2581 Secondary hyperparathyroidism of renal origin: Secondary | ICD-10-CM | POA: Diagnosis not present

## 2022-06-26 ENCOUNTER — Ambulatory Visit: Payer: Self-pay

## 2022-06-26 NOTE — Patient Outreach (Signed)
  Care Coordination   Follow Up Visit Note   06/26/2022 Name: Carrie [name] MRN: [id number] DOB: 1950-08-18  Carrie [name] is a 72 y.o. year old female who sees Venia Carbon, MD for primary care. I engaged with Carrie [name] in the providers office today.  What matters to the patients health and wellness today?  Ongoing management and healing from status post left total knee replacement.     Goals Addressed             This Visit's Progress    Patient Stated:  I want to manage my knee pain and eventually have knee surgery."       Care Coordination Interventions: Evaluation of current treatment plan related to status post left knee arthroplasty and patient's adherence to plan as established by provider:  Patient states she has done well from her knee surgery. She states her pain level is a 2-3 on occasion and she management this with tylenol.  She states she is at 110% flexion.    Reviewed medications with patient and discussed importance of compliance Reviewed scheduled/upcoming provider appointments  Advised to continue knee exercises at  home Advised to continue to use ice, wear brace, use cane, as needed and elevate leg to reduce swelling.   Advised to notify provider for any new or ongoing symptoms.          COMPLETED: Patient StatedL:  Assistance with medical equipment removal       Care Coordination Interventions: Confirmed with patient oxygen equipment was picked up.          SDOH assessments and interventions completed:  No     Care Coordination Interventions:  Yes, provided   Follow up plan: Follow up call scheduled for 09/11/22    Encounter Outcome:  Pt. Visit Completed   Quinn Plowman RN,BSN,CCM [place] [phone] direct line

## 2022-06-27 DIAGNOSIS — N186 End stage renal disease: Secondary | ICD-10-CM | POA: Diagnosis not present

## 2022-06-27 DIAGNOSIS — Z992 Dependence on renal dialysis: Secondary | ICD-10-CM | POA: Diagnosis not present

## 2022-06-27 DIAGNOSIS — N2581 Secondary hyperparathyroidism of renal origin: Secondary | ICD-10-CM | POA: Diagnosis not present

## 2022-06-29 DIAGNOSIS — N186 End stage renal disease: Secondary | ICD-10-CM | POA: Diagnosis not present

## 2022-06-29 DIAGNOSIS — Z992 Dependence on renal dialysis: Secondary | ICD-10-CM | POA: Diagnosis not present

## 2022-06-29 DIAGNOSIS — N2581 Secondary hyperparathyroidism of renal origin: Secondary | ICD-10-CM | POA: Diagnosis not present

## 2022-07-02 DIAGNOSIS — Z992 Dependence on renal dialysis: Secondary | ICD-10-CM | POA: Diagnosis not present

## 2022-07-02 DIAGNOSIS — N186 End stage renal disease: Secondary | ICD-10-CM | POA: Diagnosis not present

## 2022-07-02 DIAGNOSIS — N2581 Secondary hyperparathyroidism of renal origin: Secondary | ICD-10-CM | POA: Diagnosis not present

## 2022-07-04 DIAGNOSIS — Z992 Dependence on renal dialysis: Secondary | ICD-10-CM | POA: Diagnosis not present

## 2022-07-04 DIAGNOSIS — N186 End stage renal disease: Secondary | ICD-10-CM | POA: Diagnosis not present

## 2022-07-04 DIAGNOSIS — N2581 Secondary hyperparathyroidism of renal origin: Secondary | ICD-10-CM | POA: Diagnosis not present

## 2022-07-05 ENCOUNTER — Telehealth: Payer: Self-pay | Admitting: *Deleted

## 2022-07-05 NOTE — Telephone Encounter (Signed)
(  Late entry for 05/02/23)- Seen in office during her f/u with MD this date. Doing well overall. Pleased with progress.

## 2022-07-06 DIAGNOSIS — N186 End stage renal disease: Secondary | ICD-10-CM | POA: Diagnosis not present

## 2022-07-06 DIAGNOSIS — Z992 Dependence on renal dialysis: Secondary | ICD-10-CM | POA: Diagnosis not present

## 2022-07-06 DIAGNOSIS — N2581 Secondary hyperparathyroidism of renal origin: Secondary | ICD-10-CM | POA: Diagnosis not present

## 2022-07-09 DIAGNOSIS — Z992 Dependence on renal dialysis: Secondary | ICD-10-CM | POA: Diagnosis not present

## 2022-07-09 DIAGNOSIS — N186 End stage renal disease: Secondary | ICD-10-CM | POA: Diagnosis not present

## 2022-07-09 DIAGNOSIS — N2581 Secondary hyperparathyroidism of renal origin: Secondary | ICD-10-CM | POA: Diagnosis not present

## 2022-07-11 DIAGNOSIS — Z992 Dependence on renal dialysis: Secondary | ICD-10-CM | POA: Diagnosis not present

## 2022-07-11 DIAGNOSIS — N186 End stage renal disease: Secondary | ICD-10-CM | POA: Diagnosis not present

## 2022-07-11 DIAGNOSIS — N2581 Secondary hyperparathyroidism of renal origin: Secondary | ICD-10-CM | POA: Diagnosis not present

## 2022-07-12 ENCOUNTER — Other Ambulatory Visit: Payer: Self-pay | Admitting: Internal Medicine

## 2022-07-12 ENCOUNTER — Other Ambulatory Visit: Payer: Self-pay | Admitting: Nurse Practitioner

## 2022-07-12 NOTE — Telephone Encounter (Signed)
Last filled 05-15-22 #60 Last OV 02-20-22 Next OV 02-26-23 [facility]

## 2022-07-13 DIAGNOSIS — Z992 Dependence on renal dialysis: Secondary | ICD-10-CM | POA: Diagnosis not present

## 2022-07-13 DIAGNOSIS — N186 End stage renal disease: Secondary | ICD-10-CM | POA: Diagnosis not present

## 2022-07-13 DIAGNOSIS — N2581 Secondary hyperparathyroidism of renal origin: Secondary | ICD-10-CM | POA: Diagnosis not present

## 2022-07-16 DIAGNOSIS — N2581 Secondary hyperparathyroidism of renal origin: Secondary | ICD-10-CM | POA: Diagnosis not present

## 2022-07-16 DIAGNOSIS — Z992 Dependence on renal dialysis: Secondary | ICD-10-CM | POA: Diagnosis not present

## 2022-07-16 DIAGNOSIS — N186 End stage renal disease: Secondary | ICD-10-CM | POA: Diagnosis not present

## 2022-07-18 DIAGNOSIS — N2581 Secondary hyperparathyroidism of renal origin: Secondary | ICD-10-CM | POA: Diagnosis not present

## 2022-07-18 DIAGNOSIS — N186 End stage renal disease: Secondary | ICD-10-CM | POA: Diagnosis not present

## 2022-07-18 DIAGNOSIS — Z992 Dependence on renal dialysis: Secondary | ICD-10-CM | POA: Diagnosis not present

## 2022-07-20 DIAGNOSIS — N186 End stage renal disease: Secondary | ICD-10-CM | POA: Diagnosis not present

## 2022-07-20 DIAGNOSIS — N2581 Secondary hyperparathyroidism of renal origin: Secondary | ICD-10-CM | POA: Diagnosis not present

## 2022-07-20 DIAGNOSIS — Z992 Dependence on renal dialysis: Secondary | ICD-10-CM | POA: Diagnosis not present

## 2022-07-23 DIAGNOSIS — N2581 Secondary hyperparathyroidism of renal origin: Secondary | ICD-10-CM | POA: Diagnosis not present

## 2022-07-23 DIAGNOSIS — N186 End stage renal disease: Secondary | ICD-10-CM | POA: Diagnosis not present

## 2022-07-23 DIAGNOSIS — Z992 Dependence on renal dialysis: Secondary | ICD-10-CM | POA: Diagnosis not present

## 2022-07-25 DIAGNOSIS — N186 End stage renal disease: Secondary | ICD-10-CM | POA: Diagnosis not present

## 2022-07-25 DIAGNOSIS — Z992 Dependence on renal dialysis: Secondary | ICD-10-CM | POA: Diagnosis not present

## 2022-07-25 DIAGNOSIS — N2581 Secondary hyperparathyroidism of renal origin: Secondary | ICD-10-CM | POA: Diagnosis not present

## 2022-07-27 DIAGNOSIS — Z992 Dependence on renal dialysis: Secondary | ICD-10-CM | POA: Diagnosis not present

## 2022-07-27 DIAGNOSIS — N2581 Secondary hyperparathyroidism of renal origin: Secondary | ICD-10-CM | POA: Diagnosis not present

## 2022-07-27 DIAGNOSIS — N186 End stage renal disease: Secondary | ICD-10-CM | POA: Diagnosis not present

## 2022-07-30 DIAGNOSIS — N2581 Secondary hyperparathyroidism of renal origin: Secondary | ICD-10-CM | POA: Diagnosis not present

## 2022-07-30 DIAGNOSIS — N186 End stage renal disease: Secondary | ICD-10-CM | POA: Diagnosis not present

## 2022-07-30 DIAGNOSIS — Z992 Dependence on renal dialysis: Secondary | ICD-10-CM | POA: Diagnosis not present

## 2022-07-31 ENCOUNTER — Ambulatory Visit (INDEPENDENT_AMBULATORY_CARE_PROVIDER_SITE_OTHER): Payer: Medicare HMO

## 2022-07-31 ENCOUNTER — Encounter: Payer: Self-pay | Admitting: Orthopaedic Surgery

## 2022-07-31 ENCOUNTER — Ambulatory Visit (INDEPENDENT_AMBULATORY_CARE_PROVIDER_SITE_OTHER): Payer: Medicare HMO | Admitting: Orthopaedic Surgery

## 2022-07-31 ENCOUNTER — Telehealth: Payer: Self-pay | Admitting: *Deleted

## 2022-07-31 DIAGNOSIS — Z96652 Presence of left artificial knee joint: Secondary | ICD-10-CM

## 2022-07-31 NOTE — Progress Notes (Signed)
Office Visit Note   Patient: Carrie [name]           Date of Birth: February 01, 1951           MRN: [id number] Visit Date: 07/31/2022              Requested by: Venia Carbon, MD [place],  [place] [address] PCP: Venia Carbon, MD   Assessment & Plan: Visit Diagnoses:  1. Status post left knee replacement     Plan:  Will have her continue to work on range of motion and strengthening.  Will see her back in 1 month to see how she is doing overall.  Reassurance was given that examination and radiographs reveal no concerns.  She will follow-up with [place] sooner if she has any concerns or worsening pain.  Questions were encouraged and answered at length.   Follow-Up Instructions: Return in about 4 weeks (around 08/28/2022).   Orders:  Orders Placed This Encounter  Procedures   XR Knee 1-2 Views Left   No orders of the defined types were placed in this encounter.     Procedures: No procedures performed   Clinical Data: No additional findings.   Subjective: Chief Complaint  Patient presents with   Left Knee - Edema    HPI Carrie [name] comes in today due to left knee pain.  She is send 4 and half month status post left total knee arthroplasty.  She states she went to bed and her knee was fine.  She did awaken on Sunday morning had difficulty moving the knee and had problems bearing weight due to swelling.  She had to go back on a walker.  She denies any injury.  Denies any fevers chills.  She states no significant increase in activities however she was out to dinner and went to a bounce house but did not participate.  She is using a central rolls on the knee ice somewhat improved.  Review of Systems See HPI  Objective: Vital Signs: There were no vitals taken for this visit.  Physical Exam Constitutional:      Appearance: She is not ill-appearing or diaphoretic.  Pulmonary:     Effort: Pulmonary effort is normal.  Neurological:     Mental Status:  She is alert and oriented to person, place, and time.  Psychiatric:        Mood and Affect: Mood normal.     Ortho Exam Left knee full extension flexion to approximately 110 115 degrees.  No instability valgus varus stressing.  Surgical incisions healing well.  No signs of infection.  Calf supple nontender.  Specialty Comments:  No specialty comments available.  Imaging: XR Knee 1-2 Views Left  Result Date: 07/31/2022 Left knee 2 views: Status post left total knee arthroplasty well-seated components.  No acute fractures.  Knee is well located.  No bony abnormalities.    PMFS History: Patient Active Problem List   Diagnosis Date Noted   Arthritis of left knee 03/13/2022   OA (osteoarthritis) of knee 03/13/2022   Status post total left knee replacement 03/13/2022   Chronic diastolic heart failure ([place]) 05/17/2021   Seizure disorder ([place])    Hyperlipidemia    SOB (shortness of breath) 05/01/2021   Anemia 05/01/2021   Positive FIT (fecal immunochemical test)    Polyp of ascending colon    Polyp of descending colon    Polyp of transverse colon    Dysphagia 02/07/2021   Tremor  12/01/2020   Headache 12/01/2020   Eustachian tube dysfunction 10/25/2020   History of shingles 09/26/2020   Primary osteoarthritis of left knee 99991111   Complication from renal dialysis device 11/20/2018   ESRD on hemodialysis ([place]) 08/04/2018   Status post total right knee replacement 04/30/2017   History of total hip replacement, left 03/18/2017   Status post total replacement of left hip 03/27/2016   Mood disorder ([place]) 04/01/2015   Osteoarthritis of right knee 02/03/2014   Advanced directives, counseling/discussion 02/03/2014   Routine general medical examination at a health care facility 07/08/2012   Morbid obesity ([place]) 07/08/2012   Otitis externa 03/20/2010   HYPERCHOLESTEROLEMIA 09/26/2006   Essential hypertension 09/26/2006   ALLERGIC RHINITIS 09/26/2006   History of meningioma  08/03/2006   Past Medical History:  Diagnosis Date   Allergy    Anemia    Arthritis    "right knee" (10/26'/2017)   Chronic heart failure with preserved ejection fraction (HFpEF) ([place])    a. 04/2021 Echo: EF >55%, GrI DD, nl RV fxn, triv MR; b. 12/2021 Echo: EF 60-65%, no rwma.   Demand ischemia    a. 04/2021 HsTrop up to 73 in setting of volume overload; b. 10/2021 MV: EF 66% with small, mild reversible defect in the apical septal and mid anteroseptal myocardium which was felt to most likely represent artifact.   Depression    ESRD (end stage renal disease) ([place])    a. OnHD (Dr. Lavonia Dana)   History of blood transfusion 2008   "when I had brain tumor surgery"   History of MRSA infection 2008   History of stress test    a. 07/2018 MV: EF 66%, no ischemia/infarct.   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 12/2021 Echo: EF 60-65%, no rwma, mild LVH, nl RV fxn, small pericardial effusion - small pocket of post wall, ~ 1cm.   Precordial chest pain    a. 10/2021 MV: low risk.   Type II diabetes mellitus ([place]) 2011   on metformin until yr ago- no med now - off due to kidneys    Family History  Problem Relation Age of Onset   Hypertension Other        Strong family history    Breast cancer Mother 66   Heart failure Sister    Heart disease Sister     Past Surgical History:  Procedure Laterality Date   A/V FISTULAGRAM Left 03/10/2019   Procedure: A/V FISTULAGRAM;  Surgeon: Katha Cabal, MD;  Location: [place] [facility];  Service: Cardiovascular;  Laterality: Left;   A/V FISTULAGRAM Left 07/14/2019   Procedure: A/V FISTULAGRAM;  Surgeon: Katha Cabal, MD;  Location: [place] [facility];  Service: Cardiovascular;  Laterality: Left;   A/V FISTULAGRAM Left 01/07/2020   Procedure: A/V FISTULAGRAM;  Surgeon: Katha Cabal, MD;  Location: [place] [facility];  Service: Cardiovascular;  Laterality: Left;   A/V FISTULAGRAM Left  11/14/2021   Procedure: A/V Fistulagram;  Surgeon: Katha Cabal, MD;  Location: [place] [facility];  Service: Cardiovascular;  Laterality: Left;   AV FISTULA PLACEMENT Left 11/07/2018   Procedure: ARTERIOVENOUS (AV) FISTULA CREATION ( BRACHIO BASILIC ) VS BRACHIAL AXILLARY GRAFT;  Surgeon: Katha Cabal, MD;  Location: [facility];  Service: Vascular;  Laterality: Left;   BRAIN MENINGIOMA EXCISION  08/2006   Foramina magnum meningioma   CERVICAL DISCECTOMY  1996   Dr.  [place]   COLONOSCOPY     COLONOSCOPY WITH PROPOFOL N/A 03/07/2021   Procedure: COLONOSCOPY WITH PROPOFOL;  Surgeon: Virgel Manifold, MD;  Location: [facility];  Service: Endoscopy;  Laterality: N/A;   DIALYSIS/PERMA CATHETER INSERTION N/A 08/04/2018   Procedure: DIALYSIS/PERMA CATHETER INSERTION;  Surgeon: Algernon Huxley, MD;  Location: [place] [facility];  Service: Cardiovascular;  Laterality: N/A;   DIALYSIS/PERMA CATHETER REMOVAL N/A 12/25/2018   Procedure: DIALYSIS/PERMA CATHETER REMOVAL;  Surgeon: Algernon Huxley, MD;  Location: [place] [facility];  Service: Cardiovascular;  Laterality: N/A;   DILATION AND CURETTAGE OF UTERUS     JOINT REPLACEMENT     right knee left hip   Right wrist x-ray  2007   Deg. at 1st MCP   TOTAL HIP ARTHROPLASTY Left 03/27/2016   Procedure: LEFT TOTAL HIP ARTHROPLASTY ANTERIOR APPROACH;  Surgeon: Mcarthur Rossetti, MD;  Location: [place];  Service: Orthopedics;  Laterality: Left;   TOTAL KNEE ARTHROPLASTY Right 04/30/2017   Procedure: RIGHT TOTAL KNEE ARTHROPLASTY;  Surgeon: Mcarthur Rossetti, MD;  Location: [place];  Service: Orthopedics;  Laterality: Right;   TOTAL KNEE ARTHROPLASTY Left 03/13/2022   Procedure: LEFT TOTAL KNEE ARTHROPLASTY;  Surgeon: Mcarthur Rossetti, MD;  Location: [place];  Service: Orthopedics;  Laterality: Left;   TUBAL LIGATION  1981   Social History   Occupational History   Occupation: Formerly Psychologist, forensic, 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

## 2022-07-31 NOTE — Telephone Encounter (Signed)
Ortho bundle 90 day in office visit completed.

## 2022-08-01 DIAGNOSIS — N186 End stage renal disease: Secondary | ICD-10-CM | POA: Diagnosis not present

## 2022-08-01 DIAGNOSIS — N2581 Secondary hyperparathyroidism of renal origin: Secondary | ICD-10-CM | POA: Diagnosis not present

## 2022-08-01 DIAGNOSIS — Z992 Dependence on renal dialysis: Secondary | ICD-10-CM | POA: Diagnosis not present

## 2022-08-02 DIAGNOSIS — N186 End stage renal disease: Secondary | ICD-10-CM | POA: Diagnosis not present

## 2022-08-02 DIAGNOSIS — Z992 Dependence on renal dialysis: Secondary | ICD-10-CM | POA: Diagnosis not present

## 2022-08-03 DIAGNOSIS — N2581 Secondary hyperparathyroidism of renal origin: Secondary | ICD-10-CM | POA: Diagnosis not present

## 2022-08-03 DIAGNOSIS — N186 End stage renal disease: Secondary | ICD-10-CM | POA: Diagnosis not present

## 2022-08-03 DIAGNOSIS — Z992 Dependence on renal dialysis: Secondary | ICD-10-CM | POA: Diagnosis not present

## 2022-08-06 DIAGNOSIS — N186 End stage renal disease: Secondary | ICD-10-CM | POA: Diagnosis not present

## 2022-08-06 DIAGNOSIS — Z992 Dependence on renal dialysis: Secondary | ICD-10-CM | POA: Diagnosis not present

## 2022-08-06 DIAGNOSIS — N2581 Secondary hyperparathyroidism of renal origin: Secondary | ICD-10-CM | POA: Diagnosis not present

## 2022-08-07 ENCOUNTER — Other Ambulatory Visit: Payer: Self-pay | Admitting: Internal Medicine

## 2022-08-07 DIAGNOSIS — Z1231 Encounter for screening mammogram for malignant neoplasm of breast: Secondary | ICD-10-CM

## 2022-08-08 DIAGNOSIS — N186 End stage renal disease: Secondary | ICD-10-CM | POA: Diagnosis not present

## 2022-08-08 DIAGNOSIS — Z992 Dependence on renal dialysis: Secondary | ICD-10-CM | POA: Diagnosis not present

## 2022-08-08 DIAGNOSIS — N2581 Secondary hyperparathyroidism of renal origin: Secondary | ICD-10-CM | POA: Diagnosis not present

## 2022-08-09 ENCOUNTER — Encounter: Payer: Self-pay | Admitting: Radiology

## 2022-08-10 DIAGNOSIS — Z992 Dependence on renal dialysis: Secondary | ICD-10-CM | POA: Diagnosis not present

## 2022-08-10 DIAGNOSIS — N2581 Secondary hyperparathyroidism of renal origin: Secondary | ICD-10-CM | POA: Diagnosis not present

## 2022-08-10 DIAGNOSIS — N186 End stage renal disease: Secondary | ICD-10-CM | POA: Diagnosis not present

## 2022-08-13 DIAGNOSIS — N186 End stage renal disease: Secondary | ICD-10-CM | POA: Diagnosis not present

## 2022-08-13 DIAGNOSIS — Z992 Dependence on renal dialysis: Secondary | ICD-10-CM | POA: Diagnosis not present

## 2022-08-13 DIAGNOSIS — N2581 Secondary hyperparathyroidism of renal origin: Secondary | ICD-10-CM | POA: Diagnosis not present

## 2022-08-15 DIAGNOSIS — Z992 Dependence on renal dialysis: Secondary | ICD-10-CM | POA: Diagnosis not present

## 2022-08-15 DIAGNOSIS — N2581 Secondary hyperparathyroidism of renal origin: Secondary | ICD-10-CM | POA: Diagnosis not present

## 2022-08-15 DIAGNOSIS — N186 End stage renal disease: Secondary | ICD-10-CM | POA: Diagnosis not present

## 2022-08-17 DIAGNOSIS — Z992 Dependence on renal dialysis: Secondary | ICD-10-CM | POA: Diagnosis not present

## 2022-08-17 DIAGNOSIS — N2581 Secondary hyperparathyroidism of renal origin: Secondary | ICD-10-CM | POA: Diagnosis not present

## 2022-08-17 DIAGNOSIS — N186 End stage renal disease: Secondary | ICD-10-CM | POA: Diagnosis not present

## 2022-08-20 DIAGNOSIS — N186 End stage renal disease: Secondary | ICD-10-CM | POA: Diagnosis not present

## 2022-08-20 DIAGNOSIS — Z992 Dependence on renal dialysis: Secondary | ICD-10-CM | POA: Diagnosis not present

## 2022-08-20 DIAGNOSIS — N2581 Secondary hyperparathyroidism of renal origin: Secondary | ICD-10-CM | POA: Diagnosis not present

## 2022-08-22 DIAGNOSIS — N2581 Secondary hyperparathyroidism of renal origin: Secondary | ICD-10-CM | POA: Diagnosis not present

## 2022-08-22 DIAGNOSIS — Z992 Dependence on renal dialysis: Secondary | ICD-10-CM | POA: Diagnosis not present

## 2022-08-22 DIAGNOSIS — N186 End stage renal disease: Secondary | ICD-10-CM | POA: Diagnosis not present

## 2022-08-23 ENCOUNTER — Other Ambulatory Visit: Payer: Self-pay | Admitting: Internal Medicine

## 2022-08-23 NOTE — Telephone Encounter (Signed)
Last filled 07-12-22 #60 Last OV 02-20-22 Next OV 02-26-23 [facility]

## 2022-08-24 DIAGNOSIS — N2581 Secondary hyperparathyroidism of renal origin: Secondary | ICD-10-CM | POA: Diagnosis not present

## 2022-08-24 DIAGNOSIS — Z992 Dependence on renal dialysis: Secondary | ICD-10-CM | POA: Diagnosis not present

## 2022-08-24 DIAGNOSIS — N186 End stage renal disease: Secondary | ICD-10-CM | POA: Diagnosis not present

## 2022-08-27 DIAGNOSIS — N2581 Secondary hyperparathyroidism of renal origin: Secondary | ICD-10-CM | POA: Diagnosis not present

## 2022-08-27 DIAGNOSIS — Z992 Dependence on renal dialysis: Secondary | ICD-10-CM | POA: Diagnosis not present

## 2022-08-27 DIAGNOSIS — N186 End stage renal disease: Secondary | ICD-10-CM | POA: Diagnosis not present

## 2022-08-28 ENCOUNTER — Ambulatory Visit: Payer: Medicare HMO | Admitting: Orthopaedic Surgery

## 2022-08-28 ENCOUNTER — Encounter: Payer: Self-pay | Admitting: Orthopaedic Surgery

## 2022-08-28 DIAGNOSIS — Z96652 Presence of left artificial knee joint: Secondary | ICD-10-CM

## 2022-08-28 NOTE — Progress Notes (Signed)
The patient is now 5 months status post a left total knee arthroplasty.  She had her right knee replaced in 2018.  We saw her a month ago the x-rays look good and she is now states that she is doing well and has good range of motion and strength of that left knee with no complaints at all.  There is no significant swelling of her left knee today and her range of motion is full.  The knee feels ligamentously stable.  She is happy overall.  Will see her back in 6 months which will be close to the 1 year standpoint.  At that visit we will have a standing AP and lateral of her left operative knee.  All questions and concerns were answered and addressed.

## 2022-08-29 DIAGNOSIS — N2581 Secondary hyperparathyroidism of renal origin: Secondary | ICD-10-CM | POA: Diagnosis not present

## 2022-08-29 DIAGNOSIS — N186 End stage renal disease: Secondary | ICD-10-CM | POA: Diagnosis not present

## 2022-08-29 DIAGNOSIS — Z992 Dependence on renal dialysis: Secondary | ICD-10-CM | POA: Diagnosis not present

## 2022-08-31 DIAGNOSIS — Z992 Dependence on renal dialysis: Secondary | ICD-10-CM | POA: Diagnosis not present

## 2022-08-31 DIAGNOSIS — N186 End stage renal disease: Secondary | ICD-10-CM | POA: Diagnosis not present

## 2022-08-31 DIAGNOSIS — N2581 Secondary hyperparathyroidism of renal origin: Secondary | ICD-10-CM | POA: Diagnosis not present

## 2022-09-02 DIAGNOSIS — Z992 Dependence on renal dialysis: Secondary | ICD-10-CM | POA: Diagnosis not present

## 2022-09-02 DIAGNOSIS — N186 End stage renal disease: Secondary | ICD-10-CM | POA: Diagnosis not present

## 2022-09-03 DIAGNOSIS — N186 End stage renal disease: Secondary | ICD-10-CM | POA: Diagnosis not present

## 2022-09-03 DIAGNOSIS — Z992 Dependence on renal dialysis: Secondary | ICD-10-CM | POA: Diagnosis not present

## 2022-09-03 DIAGNOSIS — N2581 Secondary hyperparathyroidism of renal origin: Secondary | ICD-10-CM | POA: Diagnosis not present

## 2022-09-05 DIAGNOSIS — N186 End stage renal disease: Secondary | ICD-10-CM | POA: Diagnosis not present

## 2022-09-05 DIAGNOSIS — Z992 Dependence on renal dialysis: Secondary | ICD-10-CM | POA: Diagnosis not present

## 2022-09-05 DIAGNOSIS — N2581 Secondary hyperparathyroidism of renal origin: Secondary | ICD-10-CM | POA: Diagnosis not present

## 2022-09-06 IMAGING — MG MM DIGITAL SCREENING BILAT W/ TOMO AND CAD
6 of 12 series · 6 of 36 positions shown · non-contrast
Comparison: Previous exam(s).

CLINICAL DATA: Screening.

EXAM:
DIGITAL SCREENING BILATERAL MAMMOGRAM WITH TOMOSYNTHESIS AND CAD
TECHNIQUE: Bilateral screening digital craniocaudal and mediolateral oblique
mammograms were obtained. Bilateral screening digital breast
tomosynthesis was performed. The images were evaluated with
computer-aided detection.

[R MLO synth-2D (1 of 2)]
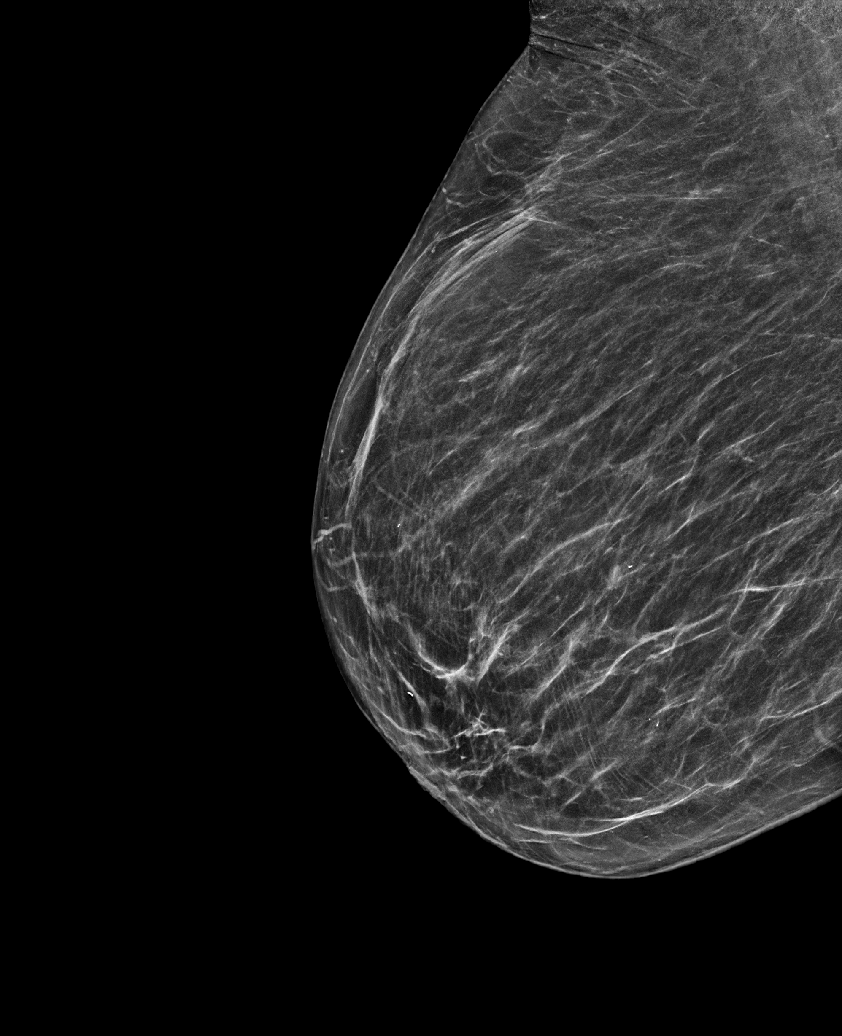

[L MLO synth-2D]
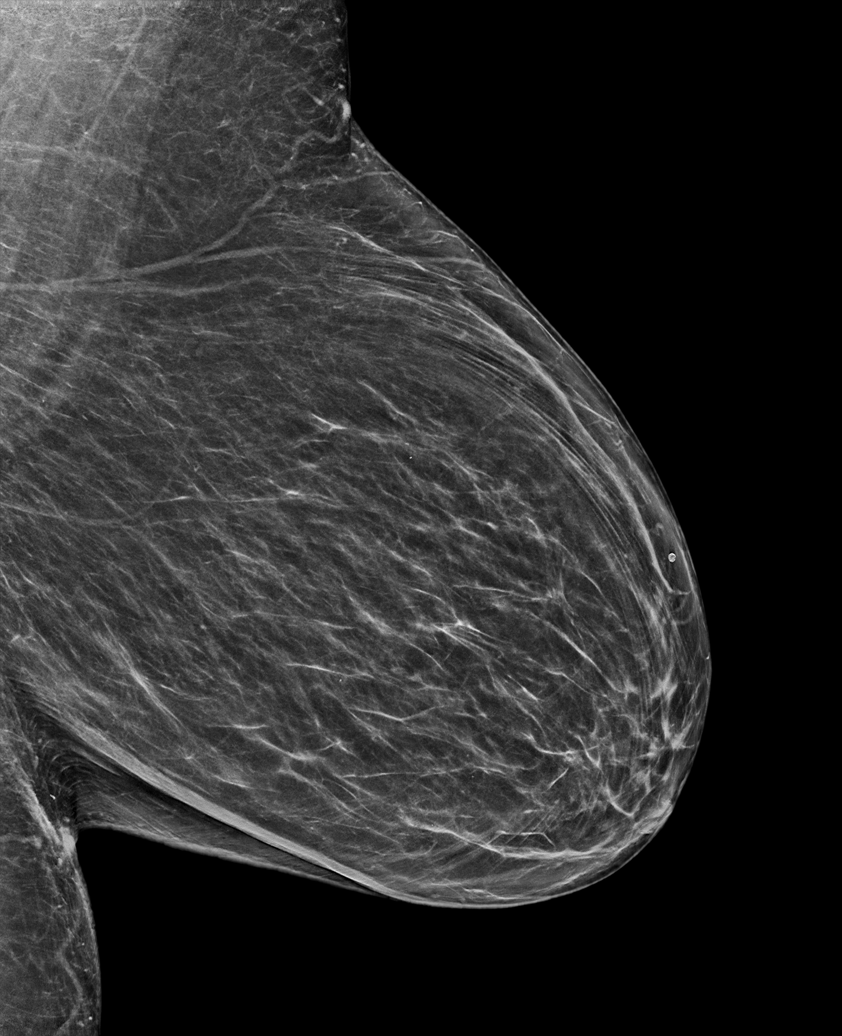

[R CC synth-2D]
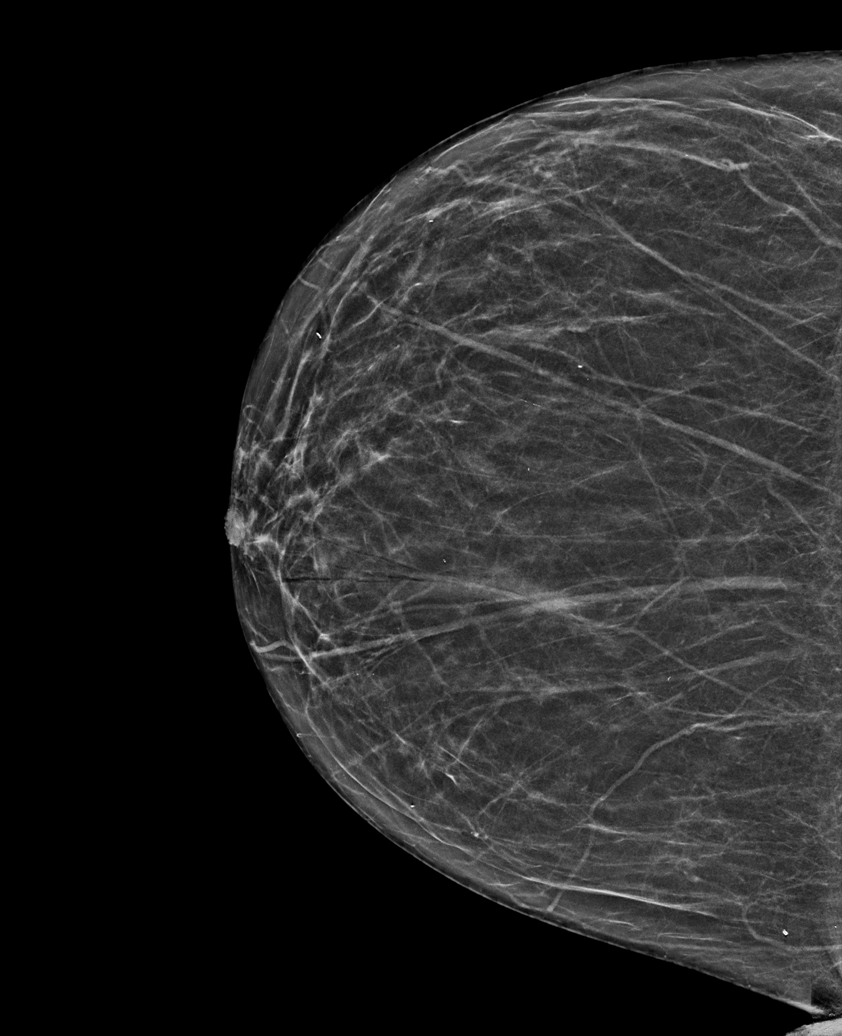

[L CC synth-2D]
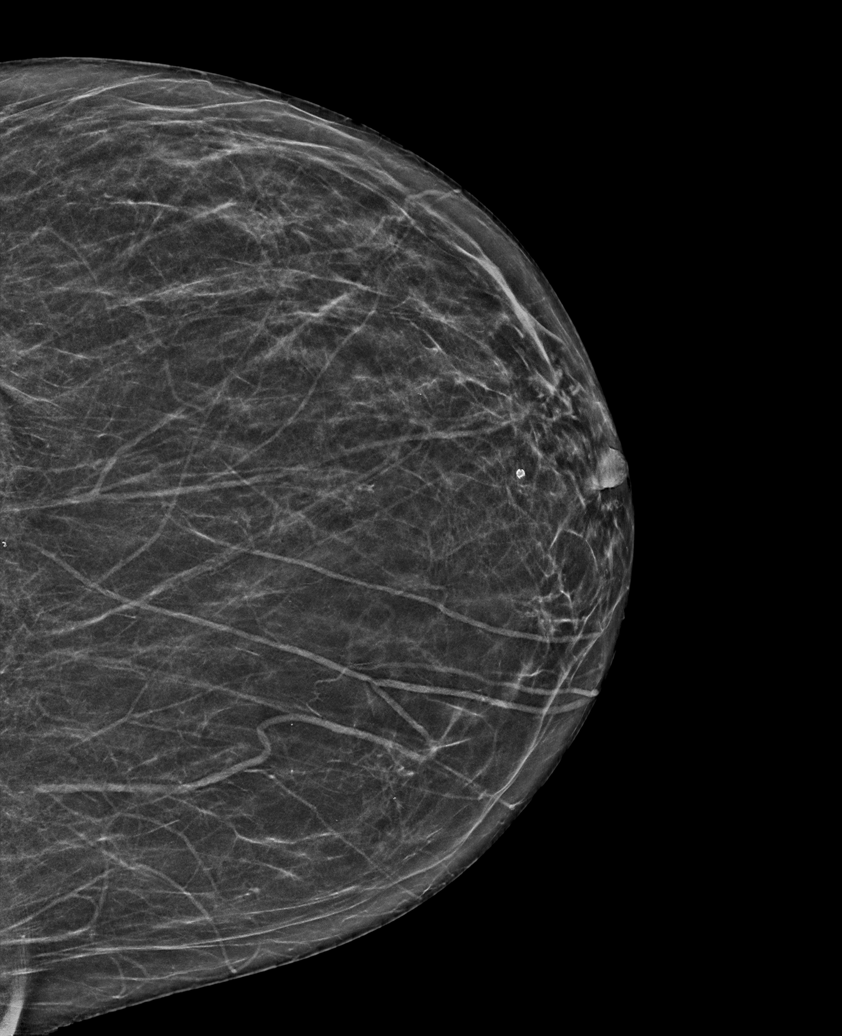

[R CV synth-2D]
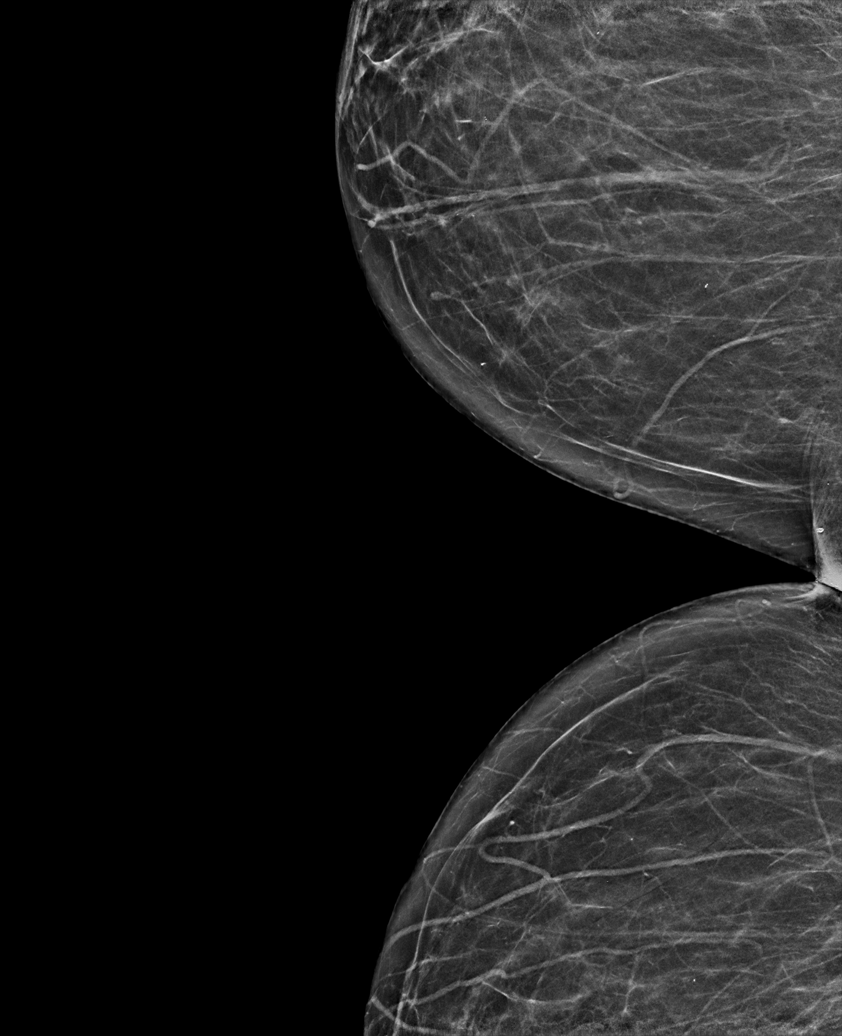

[R MLO synth-2D (2 of 2)]
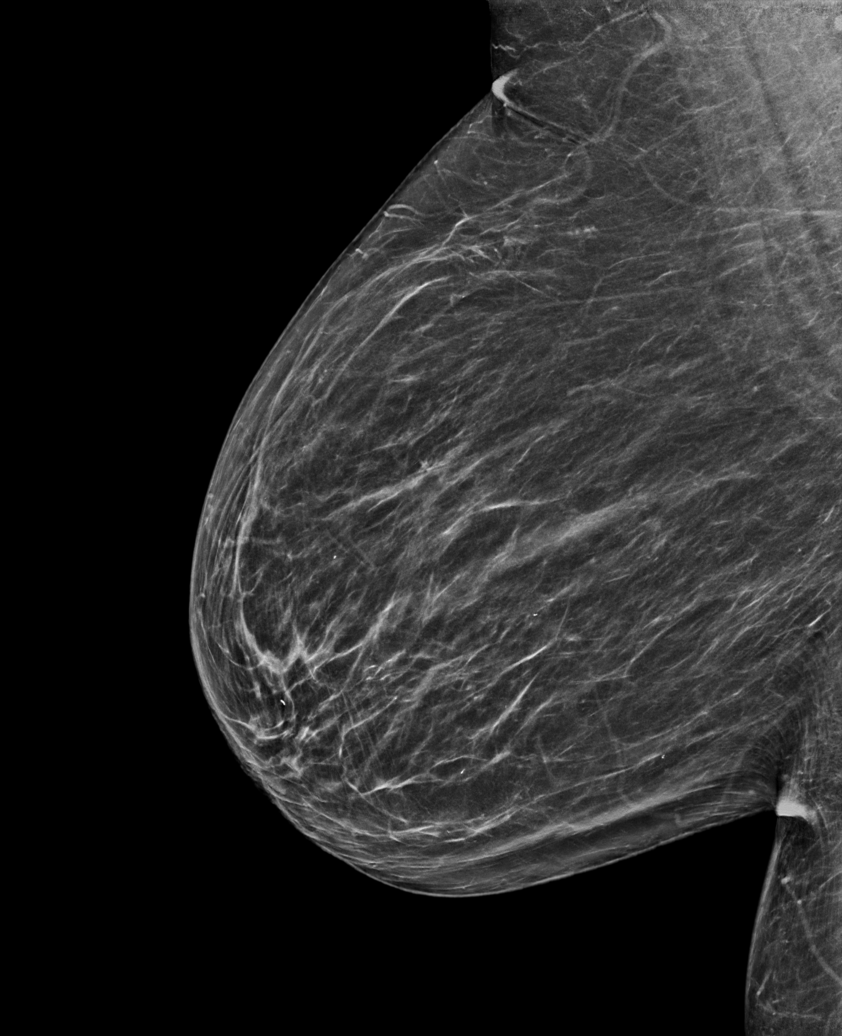

[6 of 36 positions shown; findings below may reference images not displayed]

ACR Breast Density Category b: There are scattered areas of
fibroglandular density.
FINDINGS: There are no findings suspicious for malignancy.
IMPRESSION: No mammographic evidence of malignancy. A result letter of this
screening mammogram will be mailed directly to the patient.

RECOMMENDATION:
Screening mammogram in one year. (Code:51-O-LD2)

BI-RADS CATEGORY  1: Negative.

## 2022-09-07 DIAGNOSIS — Z992 Dependence on renal dialysis: Secondary | ICD-10-CM | POA: Diagnosis not present

## 2022-09-07 DIAGNOSIS — N2581 Secondary hyperparathyroidism of renal origin: Secondary | ICD-10-CM | POA: Diagnosis not present

## 2022-09-07 DIAGNOSIS — N186 End stage renal disease: Secondary | ICD-10-CM | POA: Diagnosis not present

## 2022-09-10 DIAGNOSIS — Z992 Dependence on renal dialysis: Secondary | ICD-10-CM | POA: Diagnosis not present

## 2022-09-10 DIAGNOSIS — N2581 Secondary hyperparathyroidism of renal origin: Secondary | ICD-10-CM | POA: Diagnosis not present

## 2022-09-10 DIAGNOSIS — N186 End stage renal disease: Secondary | ICD-10-CM | POA: Diagnosis not present

## 2022-09-11 ENCOUNTER — Ambulatory Visit: Payer: Self-pay

## 2022-09-11 NOTE — Patient Outreach (Signed)
  Care Coordination   Follow Up Visit Note   09/11/2022 Name: Carrie [name] MRN: [id number] DOB: [birth year]  Carrie [name] is a 72 y.o. year old female who sees Karie Schwalbe, MD for primary care. I spoke with  Carrie [name] by phone today.  What matters to the patients health and wellness today?  Patient states she is doing very well. She reports having follow up visit with orthopedic surgeon. She denies any knee pain. She states she has full range of motion with her knee and she follows up with orthopedic surgeon in 6 months. Patient denies any complications related to her dialysis. She reports blood pressures continues to range low 100's/60's to 160's/ 80's.   Patient states she is going out of town with her family this weekend.  She states she is looking forward to this.    Goals Addressed             This Visit's Progress    management / education for health conditions.       Interventions Today    Flowsheet Row Most Recent Value  Chronic Disease   Chronic disease during today's visit Hypertension (HTN), Chronic Kidney Disease/End Stage Renal Disease (ESRD), Other  General Interventions   General Interventions Discussed/Reviewed General Interventions Reviewed, Doctor Visits  [evaluation of current treatment plan for HTN, ESRD, Knee replacement and patients adherence to plan as established by provider.  Assessed for symptoms. Assessed for home blood pressure readings. Discussed ongoing follow up with [facility] for care coordination]  Doctor Visits Discussed/Reviewed Doctor Visits Reviewed  [facility] scheduled/ upcoming provider visitis. Reviewed / discussed most recent orthopedic follow up visit.]  Education Interventions   Education Provided Provided Education  Provided Verbal Education On Other  [Advised to continue blood pressure monitoring/ weighing daily. Advised to continue to adhere to ESRD nutrition guidelines, low salt diet and portion control.]  Pharmacy Interventions    Pharmacy Dicussed/Reviewed Pharmacy Topics Reviewed  [medications reviewed and compliance discussed]           COMPLETED: Patient Stated:  I want to manage my knee pain and eventually have knee surgery."       Assessed for knee pain, range of motion and follow up with orthopedic provider.              SDOH assessments and interventions completed:  No     Care Coordination Interventions:  Yes, provided   Follow up plan: Follow up call scheduled for 12/11/22    Encounter Outcome:  Pt. Visit Completed   George Ina RN,BSN,CCM [facility] Care Coordination [phone] direct line

## 2022-09-12 DIAGNOSIS — Z992 Dependence on renal dialysis: Secondary | ICD-10-CM | POA: Diagnosis not present

## 2022-09-12 DIAGNOSIS — N186 End stage renal disease: Secondary | ICD-10-CM | POA: Diagnosis not present

## 2022-09-12 DIAGNOSIS — N2581 Secondary hyperparathyroidism of renal origin: Secondary | ICD-10-CM | POA: Diagnosis not present

## 2022-09-14 DIAGNOSIS — N2581 Secondary hyperparathyroidism of renal origin: Secondary | ICD-10-CM | POA: Diagnosis not present

## 2022-09-14 DIAGNOSIS — Z992 Dependence on renal dialysis: Secondary | ICD-10-CM | POA: Diagnosis not present

## 2022-09-14 DIAGNOSIS — N186 End stage renal disease: Secondary | ICD-10-CM | POA: Diagnosis not present

## 2022-09-19 DIAGNOSIS — Z992 Dependence on renal dialysis: Secondary | ICD-10-CM | POA: Diagnosis not present

## 2022-09-19 DIAGNOSIS — N2581 Secondary hyperparathyroidism of renal origin: Secondary | ICD-10-CM | POA: Diagnosis not present

## 2022-09-19 DIAGNOSIS — E119 Type 2 diabetes mellitus without complications: Secondary | ICD-10-CM | POA: Diagnosis not present

## 2022-09-19 DIAGNOSIS — N186 End stage renal disease: Secondary | ICD-10-CM | POA: Diagnosis not present

## 2022-09-21 DIAGNOSIS — Z992 Dependence on renal dialysis: Secondary | ICD-10-CM | POA: Diagnosis not present

## 2022-09-21 DIAGNOSIS — N2581 Secondary hyperparathyroidism of renal origin: Secondary | ICD-10-CM | POA: Diagnosis not present

## 2022-09-21 DIAGNOSIS — N186 End stage renal disease: Secondary | ICD-10-CM | POA: Diagnosis not present

## 2022-09-24 DIAGNOSIS — N2581 Secondary hyperparathyroidism of renal origin: Secondary | ICD-10-CM | POA: Diagnosis not present

## 2022-09-24 DIAGNOSIS — N186 End stage renal disease: Secondary | ICD-10-CM | POA: Diagnosis not present

## 2022-09-24 DIAGNOSIS — Z992 Dependence on renal dialysis: Secondary | ICD-10-CM | POA: Diagnosis not present

## 2022-09-26 DIAGNOSIS — N2581 Secondary hyperparathyroidism of renal origin: Secondary | ICD-10-CM | POA: Diagnosis not present

## 2022-09-26 DIAGNOSIS — N186 End stage renal disease: Secondary | ICD-10-CM | POA: Diagnosis not present

## 2022-09-26 DIAGNOSIS — Z992 Dependence on renal dialysis: Secondary | ICD-10-CM | POA: Diagnosis not present

## 2022-09-27 ENCOUNTER — Other Ambulatory Visit: Payer: Self-pay | Admitting: Internal Medicine

## 2022-09-27 NOTE — Telephone Encounter (Signed)
Last filled 08-23-22 #60 Last OV 02-20-22 Next OV 02-26-23 [facility]

## 2022-09-28 ENCOUNTER — Other Ambulatory Visit (INDEPENDENT_AMBULATORY_CARE_PROVIDER_SITE_OTHER): Payer: Self-pay | Admitting: Nurse Practitioner

## 2022-09-28 DIAGNOSIS — N2581 Secondary hyperparathyroidism of renal origin: Secondary | ICD-10-CM | POA: Diagnosis not present

## 2022-09-28 DIAGNOSIS — N186 End stage renal disease: Secondary | ICD-10-CM | POA: Diagnosis not present

## 2022-09-28 DIAGNOSIS — Z992 Dependence on renal dialysis: Secondary | ICD-10-CM | POA: Diagnosis not present

## 2022-09-28 DIAGNOSIS — T829XXS Unspecified complication of cardiac and vascular prosthetic device, implant and graft, sequela: Secondary | ICD-10-CM

## 2022-10-01 DIAGNOSIS — N186 End stage renal disease: Secondary | ICD-10-CM | POA: Diagnosis not present

## 2022-10-01 DIAGNOSIS — N2581 Secondary hyperparathyroidism of renal origin: Secondary | ICD-10-CM | POA: Diagnosis not present

## 2022-10-01 DIAGNOSIS — Z992 Dependence on renal dialysis: Secondary | ICD-10-CM | POA: Diagnosis not present

## 2022-10-02 ENCOUNTER — Encounter (INDEPENDENT_AMBULATORY_CARE_PROVIDER_SITE_OTHER): Payer: Self-pay | Admitting: Vascular Surgery

## 2022-10-02 ENCOUNTER — Ambulatory Visit (INDEPENDENT_AMBULATORY_CARE_PROVIDER_SITE_OTHER): Payer: Medicare HMO | Admitting: Vascular Surgery

## 2022-10-02 ENCOUNTER — Ambulatory Visit (INDEPENDENT_AMBULATORY_CARE_PROVIDER_SITE_OTHER): Payer: Medicare HMO

## 2022-10-02 VITALS — BP 178/88 | HR 73 | Resp 15 | Wt 219.0 lb

## 2022-10-02 DIAGNOSIS — N186 End stage renal disease: Secondary | ICD-10-CM

## 2022-10-02 DIAGNOSIS — T829XXS Unspecified complication of cardiac and vascular prosthetic device, implant and graft, sequela: Secondary | ICD-10-CM | POA: Diagnosis not present

## 2022-10-02 DIAGNOSIS — Z992 Dependence on renal dialysis: Secondary | ICD-10-CM

## 2022-10-02 DIAGNOSIS — E785 Hyperlipidemia, unspecified: Secondary | ICD-10-CM | POA: Diagnosis not present

## 2022-10-02 DIAGNOSIS — I10 Essential (primary) hypertension: Secondary | ICD-10-CM

## 2022-10-02 NOTE — Assessment & Plan Note (Signed)
lipid control important in reducing the progression of atherosclerotic disease. Continue statin therapy  

## 2022-10-02 NOTE — Progress Notes (Signed)
Patient ID: Carrie [name], female   DOB: 11/02/1950, 72 y.o.   MRN: [id number]  Chief Complaint  Patient presents with   Follow-up    Ref [place] consult difficult cannulating/prolonged bleeding    HPI Carrie [name] is a 72 y.o. female.  I am asked to see the patient by Caryl Never for evaluation of her dialysis access.  The patient is a longstanding patient of ours but had not been seen here this year.  Her access had been doing reasonably well until she was in the hospital a few months back getting knee surgery in [place].  They had difficulty with her access there.  It sounds like there were techs who were unable to successfully access the graft were having issues with bleeding.  At her dialysis center as an outpatient, this has been running better with her experience technicians.  She knows this is a difficult stick and it is a longstanding graft.  Her duplex today shows a patent left upper arm AV graft with only mildly elevated velocities but nothing that appears hemodynamically significant.   Past Medical History:  Diagnosis Date   Allergy    Anemia    Arthritis    "right knee" (10/26'/2017)   Chronic heart failure with preserved ejection fraction (HFpEF) (HCC)    a. 04/2021 Echo: EF >55%, GrI DD, nl RV fxn, triv MR; b. 12/2021 Echo: EF 60-65%, no rwma.   Demand ischemia    a. 04/2021 HsTrop up to 73 in setting of volume overload; b. 10/2021 MV: EF 66% with small, mild reversible defect in the apical septal and mid anteroseptal myocardium which was felt to most likely represent artifact.   Depression    ESRD (end stage renal disease) (HCC)    a. [facility] (Dr. Lamont Dowdy)   History of blood transfusion 2008   "when I had brain tumor surgery"   History of MRSA infection 2008   History of stress test    a. 07/2018 MV: EF 66%, no ischemia/infarct.   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 12/2021 Echo: EF 60-65%, no rwma, mild  LVH, nl RV fxn, small pericardial effusion - small pocket of post wall, ~ 1cm.   Precordial chest pain    a. 10/2021 MV: low risk.   Type II diabetes mellitus (HCC) 2011   on metformin until yr ago- no med now - off due to kidneys    Past Surgical History:  Procedure Laterality Date   A/V FISTULAGRAM Left 03/10/2019   Procedure: A/V FISTULAGRAM;  Surgeon: Renford Dills, MD;  Location: [facility];  Service: Cardiovascular;  Laterality: Left;   A/V FISTULAGRAM Left 07/14/2019   Procedure: A/V FISTULAGRAM;  Surgeon: Renford Dills, MD;  Location: [facility];  Service: Cardiovascular;  Laterality: Left;   A/V FISTULAGRAM Left 01/07/2020   Procedure: A/V FISTULAGRAM;  Surgeon: Renford Dills, MD;  Location: [facility];  Service: Cardiovascular;  Laterality: Left;   A/V FISTULAGRAM Left 11/14/2021   Procedure: A/V Fistulagram;  Surgeon: Renford Dills, MD;  Location: [facility];  Service: Cardiovascular;  Laterality: Left;   AV FISTULA PLACEMENT Left 11/07/2018   Procedure: ARTERIOVENOUS (AV) FISTULA CREATION ( BRACHIO BASILIC ) VS BRACHIAL AXILLARY GRAFT;  Surgeon: Renford Dills, MD;  Location: [facility];  Service: Vascular;  Laterality: Left;   BRAIN MENINGIOMA EXCISION  08/2006  Foramina magnum meningioma   CERVICAL DISCECTOMY  1996   Dr. Beverlyn Roux   CESAREAN SECTION  1981   COLONOSCOPY     COLONOSCOPY WITH PROPOFOL N/A 03/07/2021   Procedure: COLONOSCOPY WITH PROPOFOL;  Surgeon: Pasty Spillers, MD;  Location: [facility];  Service: Endoscopy;  Laterality: N/A;   DIALYSIS/PERMA CATHETER INSERTION N/A 08/04/2018   Procedure: DIALYSIS/PERMA CATHETER INSERTION;  Surgeon: Annice Needy, MD;  Location: [facility];  Service: Cardiovascular;  Laterality: N/A;   DIALYSIS/PERMA CATHETER REMOVAL N/A 12/25/2018   Procedure: DIALYSIS/PERMA CATHETER REMOVAL;  Surgeon: Annice Needy, MD;  Location: [facility];  Service:  Cardiovascular;  Laterality: N/A;   DILATION AND CURETTAGE OF UTERUS     JOINT REPLACEMENT     right knee left hip   Right wrist x-ray  2007   Deg. at 1st MCP   TOTAL HIP ARTHROPLASTY Left 03/27/2016   Procedure: LEFT TOTAL HIP ARTHROPLASTY ANTERIOR APPROACH;  Surgeon: Kathryne Hitch, MD;  Location: [facility];  Service: Orthopedics;  Laterality: Left;   TOTAL KNEE ARTHROPLASTY Right 04/30/2017   Procedure: RIGHT TOTAL KNEE ARTHROPLASTY;  Surgeon: Kathryne Hitch, MD;  Location: [facility];  Service: Orthopedics;  Laterality: Right;   TOTAL KNEE ARTHROPLASTY Left 03/13/2022   Procedure: LEFT TOTAL KNEE ARTHROPLASTY;  Surgeon: Kathryne Hitch, MD;  Location: [facility];  Service: Orthopedics;  Laterality: Left;   TUBAL LIGATION  1981     Family History  Problem Relation Age of Onset   Hypertension Other        Strong family history    Breast cancer Mother 44   Heart failure Sister    Heart disease Sister       Social History   Tobacco Use   Smoking status: Never    Passive exposure: Past   Smokeless tobacco: Never  Vaping Use   Vaping Use: Never used  Substance Use Topics   Alcohol use: No    Alcohol/week: 0.0 standard drinks of alcohol   Drug use: No     Allergies  Allergen Reactions   Naproxen Sodium Anaphylaxis and Swelling   Sulfamethoxazole-Trimethoprim Anaphylaxis and Swelling    Current Outpatient Medications  Medication Sig Dispense Refill   acetaminophen (TYLENOL) 500 MG tablet Take 1,000 mg by mouth every 6 (six) hours as needed for mild pain or moderate pain.     albuterol (VENTOLIN HFA) 108 (90 Base) MCG/ACT inhaler Inhale 2 puffs into the lungs every 6 (six) hours as needed for wheezing or shortness of breath. 8 g 2   amLODipine (NORVASC) 10 MG tablet Take 10 mg by mouth daily.     ASPIRIN LOW DOSE 81 MG chewable tablet CHEW 1 TABLET (81 MG TOTAL) BY MOUTH 2 (TWO) TIMES DAILY. 180 tablet 1   atorvastatin (LIPITOR) 40 MG tablet TAKE 40 MG BY  MOUTH ONCE DAILY (Patient taking differently: Take 40 mg by mouth daily.) 90 tablet 3   carvedilol (COREG) 12.5 MG tablet TAKE 1 TABLET BY MOUTH 2 TIMES DAILY. 180 tablet 0   levETIRAcetam (KEPPRA) 1000 MG tablet TAKE 1 TABLET BY MOUTH TWICE A DAY 180 tablet 3   lidocaine-prilocaine (EMLA) cream Apply 1 application topically every Monday, Wednesday, and Friday with hemodialysis.     LORazepam (ATIVAN) 0.5 MG tablet TAKE 1 TABLET BY MOUTH TWICE A DAY AS NEEDED FOR ANXIETY 60 tablet 0   losartan (COZAAR) 100 MG tablet Take 100 mg by mouth daily.  multivitamin (RENA-VIT) TABS tablet Take 1 tablet by mouth at bedtime. 30 tablet 0   omeprazole (PRILOSEC) 20 MG capsule TAKE 1 CAPSULE BY MOUTH EVERY DAY 90 capsule 3   ondansetron (ZOFRAN-ODT) 4 MG disintegrating tablet Take 4 mg by mouth every 8 (eight) hours as needed for nausea or vomiting.     sevelamer carbonate (RENVELA) 800 MG tablet Take 800-1,600 mg by mouth See admin instructions. Take 2 tablets (1600 mg) by mouth three times daily with meals and take 1 tablet (800 mg) by mouth twice daily with snacks     torsemide (DEMADEX) 20 MG tablet Take 20 mg by mouth daily. Every day (including on dialysis days)     oxyCODONE-acetaminophen (PERCOCET/ROXICET) 5-325 MG tablet Take 1-2 tablets by mouth every 4 (four) hours as needed for severe pain. (Patient not taking: Reported on 04/11/2022)     OXYGEN Inhale 2 L into the lungs as needed (Shortness of Breath).     No current facility-administered medications for this visit.      REVIEW OF SYSTEMS (Negative unless checked)  Constitutional: [] Weight loss  [] Fever  [] Chills Cardiac: [] Chest pain   [] Chest pressure   [] Palpitations   [] Shortness of breath when laying flat   [] Shortness of breath at rest   [x] Shortness of breath with exertion. Vascular:  [] Pain in legs with walking   [] Pain in legs at rest   [] Pain in legs when laying flat   [] Claudication   [] Pain in feet when walking  [] Pain in feet at  rest  [] Pain in feet when laying flat   [] History of DVT   [] Phlebitis   [x] Swelling in legs   [] Varicose veins   [] Non-healing ulcers Pulmonary:   [] Uses home oxygen   [] Productive cough   [] Hemoptysis   [] Wheeze  [] COPD   [] Asthma Neurologic:  [] Dizziness  [] Blackouts   [] Seizures   [] History of stroke   [] History of TIA  [] Aphasia   [] Temporary blindness   [] Dysphagia   [] Weakness or numbness in arms   [] Weakness or numbness in legs Musculoskeletal:  [x] Arthritis   [] Joint swelling   [x] Joint pain   [] Low back pain Hematologic:  [] Easy bruising  [] Easy bleeding   [] Hypercoagulable state   [x] Anemic  [] Hepatitis Gastrointestinal:  [] Blood in stool   [] Vomiting blood  [] Gastroesophageal reflux/heartburn   [] Abdominal pain Genitourinary:  [x] Chronic kidney disease   [] Difficult urination  [] Frequent urination  [] Burning with urination   [] Hematuria Skin:  [] Rashes   [] Ulcers   [] Wounds Psychological:  [] History of anxiety   []  History of major depression.    Physical Exam BP (!) 178/88 (BP Location: Right Arm)   Pulse 73   Resp 15   Wt 219 lb (99.3 kg)   BMI 40.06 kg/m  Gen:  WD/WN, NAD Head: [place]/AT, No temporalis wasting.  Ear/Nose/Throat: Hearing grossly intact, nares w/o erythema or drainage, oropharynx w/o Erythema/Exudate Eyes: Conjunctiva clear, sclera non-icteric  Neck: trachea midline.  No JVD.  Pulmonary:  Good air movement, respirations not labored, no use of accessory muscles  Cardiac: RRR, no JVD Vascular: good thrill in left arm AVG Vessel Right Left  Radial Palpable Palpable               Musculoskeletal: M/S 5/5 throughout.  Extremities without ischemic changes.  No deformity or atrophy. Trace LE edema. Neurologic: Sensation grossly intact in extremities.  Symmetrical.  Speech is fluent. Motor exam as listed above. Psychiatric: Judgment intact, Mood & affect appropriate for pt's clinical situation.  Dermatologic: No rashes or ulcers noted.  No cellulitis or open  wounds.    Radiology No results found.  Labs No results found for this or any previous visit (from the past 2160 hour(s)).  Assessment/Plan:  ESRD on hemodialysis (HCC) Her duplex today shows a patent left upper arm AV graft with only mildly elevated velocities but nothing that appears hemodynamically significant.  The fact that it seems to be working well at her dialysis center and the duplex does not show any obvious significant issues, I think we can continue to monitor her graft for now.  I will plan a follow-up duplex in 6 months.  We discussed that if they begin developing difficulties with access, prolonged bleeding, or low flow rates, consideration for shuntogram should be given at that time.  Essential hypertension An underlying cause of ESRD and blood pressure control important in reducing the progression of atherosclerotic disease. On appropriate oral medications.   Hyperlipidemia lipid control important in reducing the progression of atherosclerotic disease. Continue statin therapy      Festus Barren 10/02/2022, 9:51 AM   This note was created with Dragon medical transcription system.  Any errors from dictation are unintentional.

## 2022-10-02 NOTE — Assessment & Plan Note (Signed)
An underlying cause of ESRD and blood pressure control important in reducing the progression of atherosclerotic disease. On appropriate oral medications.

## 2022-10-02 NOTE — Assessment & Plan Note (Signed)
Her duplex today shows a patent left upper arm AV graft with only mildly elevated velocities but nothing that appears hemodynamically significant.  The fact that it seems to be working well at her dialysis center and the duplex does not show any obvious significant issues, I think we can continue to monitor her graft for now.  I will plan a follow-up duplex in 6 months.  We discussed that if they begin developing difficulties with access, prolonged bleeding, or low flow rates, consideration for shuntogram should be given at that time.

## 2022-10-03 DIAGNOSIS — Z992 Dependence on renal dialysis: Secondary | ICD-10-CM | POA: Diagnosis not present

## 2022-10-03 DIAGNOSIS — N186 End stage renal disease: Secondary | ICD-10-CM | POA: Diagnosis not present

## 2022-10-03 DIAGNOSIS — N2581 Secondary hyperparathyroidism of renal origin: Secondary | ICD-10-CM | POA: Diagnosis not present

## 2022-10-05 ENCOUNTER — Telehealth: Payer: Self-pay | Admitting: Internal Medicine

## 2022-10-05 DIAGNOSIS — Z992 Dependence on renal dialysis: Secondary | ICD-10-CM | POA: Diagnosis not present

## 2022-10-05 DIAGNOSIS — N2581 Secondary hyperparathyroidism of renal origin: Secondary | ICD-10-CM | POA: Diagnosis not present

## 2022-10-05 DIAGNOSIS — N186 End stage renal disease: Secondary | ICD-10-CM | POA: Diagnosis not present

## 2022-10-05 NOTE — Telephone Encounter (Signed)
Carrie [name] an Charity fundraiser from [facility] called in requesting a copy of pt immunization be fax to  # [phone] can be reach at # [phone]

## 2022-10-05 NOTE — Telephone Encounter (Signed)
Routed immunization record from Epic.

## 2022-10-08 DIAGNOSIS — N186 End stage renal disease: Secondary | ICD-10-CM | POA: Diagnosis not present

## 2022-10-08 DIAGNOSIS — N2581 Secondary hyperparathyroidism of renal origin: Secondary | ICD-10-CM | POA: Diagnosis not present

## 2022-10-08 DIAGNOSIS — Z992 Dependence on renal dialysis: Secondary | ICD-10-CM | POA: Diagnosis not present

## 2022-10-10 DIAGNOSIS — N186 End stage renal disease: Secondary | ICD-10-CM | POA: Diagnosis not present

## 2022-10-10 DIAGNOSIS — Z992 Dependence on renal dialysis: Secondary | ICD-10-CM | POA: Diagnosis not present

## 2022-10-10 DIAGNOSIS — N2581 Secondary hyperparathyroidism of renal origin: Secondary | ICD-10-CM | POA: Diagnosis not present

## 2022-10-11 ENCOUNTER — Ambulatory Visit
Admission: RE | Admit: 2022-10-11 | Discharge: 2022-10-11 | Disposition: A | Discharge status: Home / Self Care | Payer: Medicare HMO | Source: Ambulatory Visit | Attending: Internal Medicine | Admitting: Internal Medicine

## 2022-10-11 DIAGNOSIS — Z1231 Encounter for screening mammogram for malignant neoplasm of breast: Secondary | ICD-10-CM | POA: Insufficient documentation

## 2022-10-12 DIAGNOSIS — N2581 Secondary hyperparathyroidism of renal origin: Secondary | ICD-10-CM | POA: Diagnosis not present

## 2022-10-12 DIAGNOSIS — Z992 Dependence on renal dialysis: Secondary | ICD-10-CM | POA: Diagnosis not present

## 2022-10-12 DIAGNOSIS — N186 End stage renal disease: Secondary | ICD-10-CM | POA: Diagnosis not present

## 2022-10-15 DIAGNOSIS — N186 End stage renal disease: Secondary | ICD-10-CM | POA: Diagnosis not present

## 2022-10-15 DIAGNOSIS — Z992 Dependence on renal dialysis: Secondary | ICD-10-CM | POA: Diagnosis not present

## 2022-10-15 DIAGNOSIS — N2581 Secondary hyperparathyroidism of renal origin: Secondary | ICD-10-CM | POA: Diagnosis not present

## 2022-10-17 DIAGNOSIS — N2581 Secondary hyperparathyroidism of renal origin: Secondary | ICD-10-CM | POA: Diagnosis not present

## 2022-10-17 DIAGNOSIS — N186 End stage renal disease: Secondary | ICD-10-CM | POA: Diagnosis not present

## 2022-10-17 DIAGNOSIS — Z992 Dependence on renal dialysis: Secondary | ICD-10-CM | POA: Diagnosis not present

## 2022-10-19 DIAGNOSIS — N186 End stage renal disease: Secondary | ICD-10-CM | POA: Diagnosis not present

## 2022-10-19 DIAGNOSIS — N2581 Secondary hyperparathyroidism of renal origin: Secondary | ICD-10-CM | POA: Diagnosis not present

## 2022-10-19 DIAGNOSIS — Z992 Dependence on renal dialysis: Secondary | ICD-10-CM | POA: Diagnosis not present

## 2022-10-22 DIAGNOSIS — N2581 Secondary hyperparathyroidism of renal origin: Secondary | ICD-10-CM | POA: Diagnosis not present

## 2022-10-22 DIAGNOSIS — N186 End stage renal disease: Secondary | ICD-10-CM | POA: Diagnosis not present

## 2022-10-22 DIAGNOSIS — Z992 Dependence on renal dialysis: Secondary | ICD-10-CM | POA: Diagnosis not present

## 2022-10-24 DIAGNOSIS — Z992 Dependence on renal dialysis: Secondary | ICD-10-CM | POA: Diagnosis not present

## 2022-10-24 DIAGNOSIS — N186 End stage renal disease: Secondary | ICD-10-CM | POA: Diagnosis not present

## 2022-10-24 DIAGNOSIS — N2581 Secondary hyperparathyroidism of renal origin: Secondary | ICD-10-CM | POA: Diagnosis not present

## 2022-10-26 DIAGNOSIS — N2581 Secondary hyperparathyroidism of renal origin: Secondary | ICD-10-CM | POA: Diagnosis not present

## 2022-10-26 DIAGNOSIS — N186 End stage renal disease: Secondary | ICD-10-CM | POA: Diagnosis not present

## 2022-10-26 DIAGNOSIS — Z992 Dependence on renal dialysis: Secondary | ICD-10-CM | POA: Diagnosis not present

## 2022-10-28 ENCOUNTER — Other Ambulatory Visit: Payer: Self-pay | Admitting: Nurse Practitioner

## 2022-10-29 DIAGNOSIS — Z992 Dependence on renal dialysis: Secondary | ICD-10-CM | POA: Diagnosis not present

## 2022-10-29 DIAGNOSIS — N2581 Secondary hyperparathyroidism of renal origin: Secondary | ICD-10-CM | POA: Diagnosis not present

## 2022-10-29 DIAGNOSIS — N186 End stage renal disease: Secondary | ICD-10-CM | POA: Diagnosis not present

## 2022-10-31 DIAGNOSIS — N2581 Secondary hyperparathyroidism of renal origin: Secondary | ICD-10-CM | POA: Diagnosis not present

## 2022-10-31 DIAGNOSIS — N186 End stage renal disease: Secondary | ICD-10-CM | POA: Diagnosis not present

## 2022-10-31 DIAGNOSIS — Z992 Dependence on renal dialysis: Secondary | ICD-10-CM | POA: Diagnosis not present

## 2022-11-02 DIAGNOSIS — Z992 Dependence on renal dialysis: Secondary | ICD-10-CM | POA: Diagnosis not present

## 2022-11-02 DIAGNOSIS — N186 End stage renal disease: Secondary | ICD-10-CM | POA: Diagnosis not present

## 2022-11-02 DIAGNOSIS — N2581 Secondary hyperparathyroidism of renal origin: Secondary | ICD-10-CM | POA: Diagnosis not present

## 2022-11-05 DIAGNOSIS — N186 End stage renal disease: Secondary | ICD-10-CM | POA: Diagnosis not present

## 2022-11-05 DIAGNOSIS — N2581 Secondary hyperparathyroidism of renal origin: Secondary | ICD-10-CM | POA: Diagnosis not present

## 2022-11-05 DIAGNOSIS — Z992 Dependence on renal dialysis: Secondary | ICD-10-CM | POA: Diagnosis not present

## 2022-11-07 DIAGNOSIS — N186 End stage renal disease: Secondary | ICD-10-CM | POA: Diagnosis not present

## 2022-11-07 DIAGNOSIS — Z992 Dependence on renal dialysis: Secondary | ICD-10-CM | POA: Diagnosis not present

## 2022-11-07 DIAGNOSIS — N2581 Secondary hyperparathyroidism of renal origin: Secondary | ICD-10-CM | POA: Diagnosis not present

## 2022-11-09 DIAGNOSIS — N186 End stage renal disease: Secondary | ICD-10-CM | POA: Diagnosis not present

## 2022-11-09 DIAGNOSIS — N2581 Secondary hyperparathyroidism of renal origin: Secondary | ICD-10-CM | POA: Diagnosis not present

## 2022-11-09 DIAGNOSIS — Z992 Dependence on renal dialysis: Secondary | ICD-10-CM | POA: Diagnosis not present

## 2022-11-12 ENCOUNTER — Other Ambulatory Visit: Payer: Self-pay | Admitting: Internal Medicine

## 2022-11-12 DIAGNOSIS — Z992 Dependence on renal dialysis: Secondary | ICD-10-CM | POA: Diagnosis not present

## 2022-11-12 DIAGNOSIS — N186 End stage renal disease: Secondary | ICD-10-CM | POA: Diagnosis not present

## 2022-11-12 DIAGNOSIS — N2581 Secondary hyperparathyroidism of renal origin: Secondary | ICD-10-CM | POA: Diagnosis not present

## 2022-11-12 NOTE — Telephone Encounter (Signed)
Last filled 09-27-22 #60 Last OV 02-20-22 Next OV 02-26-23 [facility]

## 2022-11-14 DIAGNOSIS — Z992 Dependence on renal dialysis: Secondary | ICD-10-CM | POA: Diagnosis not present

## 2022-11-14 DIAGNOSIS — N2581 Secondary hyperparathyroidism of renal origin: Secondary | ICD-10-CM | POA: Diagnosis not present

## 2022-11-14 DIAGNOSIS — N186 End stage renal disease: Secondary | ICD-10-CM | POA: Diagnosis not present

## 2022-11-16 DIAGNOSIS — N2581 Secondary hyperparathyroidism of renal origin: Secondary | ICD-10-CM | POA: Diagnosis not present

## 2022-11-16 DIAGNOSIS — Z992 Dependence on renal dialysis: Secondary | ICD-10-CM | POA: Diagnosis not present

## 2022-11-16 DIAGNOSIS — N186 End stage renal disease: Secondary | ICD-10-CM | POA: Diagnosis not present

## 2022-11-19 DIAGNOSIS — N186 End stage renal disease: Secondary | ICD-10-CM | POA: Diagnosis not present

## 2022-11-19 DIAGNOSIS — N2581 Secondary hyperparathyroidism of renal origin: Secondary | ICD-10-CM | POA: Diagnosis not present

## 2022-11-19 DIAGNOSIS — Z992 Dependence on renal dialysis: Secondary | ICD-10-CM | POA: Diagnosis not present

## 2022-11-21 DIAGNOSIS — N2581 Secondary hyperparathyroidism of renal origin: Secondary | ICD-10-CM | POA: Diagnosis not present

## 2022-11-21 DIAGNOSIS — Z992 Dependence on renal dialysis: Secondary | ICD-10-CM | POA: Diagnosis not present

## 2022-11-21 DIAGNOSIS — N186 End stage renal disease: Secondary | ICD-10-CM | POA: Diagnosis not present

## 2022-11-23 DIAGNOSIS — N186 End stage renal disease: Secondary | ICD-10-CM | POA: Diagnosis not present

## 2022-11-23 DIAGNOSIS — Z992 Dependence on renal dialysis: Secondary | ICD-10-CM | POA: Diagnosis not present

## 2022-11-23 DIAGNOSIS — N2581 Secondary hyperparathyroidism of renal origin: Secondary | ICD-10-CM | POA: Diagnosis not present

## 2022-11-26 DIAGNOSIS — Z992 Dependence on renal dialysis: Secondary | ICD-10-CM | POA: Diagnosis not present

## 2022-11-26 DIAGNOSIS — N2581 Secondary hyperparathyroidism of renal origin: Secondary | ICD-10-CM | POA: Diagnosis not present

## 2022-11-26 DIAGNOSIS — N186 End stage renal disease: Secondary | ICD-10-CM | POA: Diagnosis not present

## 2022-11-28 DIAGNOSIS — N186 End stage renal disease: Secondary | ICD-10-CM | POA: Diagnosis not present

## 2022-11-28 DIAGNOSIS — N2581 Secondary hyperparathyroidism of renal origin: Secondary | ICD-10-CM | POA: Diagnosis not present

## 2022-11-28 DIAGNOSIS — Z992 Dependence on renal dialysis: Secondary | ICD-10-CM | POA: Diagnosis not present

## 2022-11-30 DIAGNOSIS — N2581 Secondary hyperparathyroidism of renal origin: Secondary | ICD-10-CM | POA: Diagnosis not present

## 2022-11-30 DIAGNOSIS — Z992 Dependence on renal dialysis: Secondary | ICD-10-CM | POA: Diagnosis not present

## 2022-11-30 DIAGNOSIS — N186 End stage renal disease: Secondary | ICD-10-CM | POA: Diagnosis not present

## 2022-12-02 DIAGNOSIS — N186 End stage renal disease: Secondary | ICD-10-CM | POA: Diagnosis not present

## 2022-12-02 DIAGNOSIS — Z992 Dependence on renal dialysis: Secondary | ICD-10-CM | POA: Diagnosis not present

## 2022-12-03 DIAGNOSIS — N186 End stage renal disease: Secondary | ICD-10-CM | POA: Diagnosis not present

## 2022-12-03 DIAGNOSIS — Z992 Dependence on renal dialysis: Secondary | ICD-10-CM | POA: Diagnosis not present

## 2022-12-03 DIAGNOSIS — N2581 Secondary hyperparathyroidism of renal origin: Secondary | ICD-10-CM | POA: Diagnosis not present

## 2022-12-05 DIAGNOSIS — N2581 Secondary hyperparathyroidism of renal origin: Secondary | ICD-10-CM | POA: Diagnosis not present

## 2022-12-05 DIAGNOSIS — Z992 Dependence on renal dialysis: Secondary | ICD-10-CM | POA: Diagnosis not present

## 2022-12-05 DIAGNOSIS — N186 End stage renal disease: Secondary | ICD-10-CM | POA: Diagnosis not present

## 2022-12-07 DIAGNOSIS — Z992 Dependence on renal dialysis: Secondary | ICD-10-CM | POA: Diagnosis not present

## 2022-12-07 DIAGNOSIS — N186 End stage renal disease: Secondary | ICD-10-CM | POA: Diagnosis not present

## 2022-12-07 DIAGNOSIS — N2581 Secondary hyperparathyroidism of renal origin: Secondary | ICD-10-CM | POA: Diagnosis not present

## 2022-12-10 DIAGNOSIS — N186 End stage renal disease: Secondary | ICD-10-CM | POA: Diagnosis not present

## 2022-12-10 DIAGNOSIS — Z992 Dependence on renal dialysis: Secondary | ICD-10-CM | POA: Diagnosis not present

## 2022-12-10 DIAGNOSIS — N2581 Secondary hyperparathyroidism of renal origin: Secondary | ICD-10-CM | POA: Diagnosis not present

## 2022-12-11 ENCOUNTER — Ambulatory Visit: Payer: Self-pay

## 2022-12-11 NOTE — Patient Outreach (Signed)
  Care Coordination   Follow Up Visit Note   12/11/2022 Name: Carrie [name] MRN: [id number] DOB: [birth year]  Carrie Kitchen [name] is a 72 y.o. year old female who sees Carrie Schwalbe, MD for primary care. I spoke with  Carrie Kitchen [name] by phone today.  What matters to the patients health and wellness today?  Patient states she feels she is doing well overall. She reports her blood pressure continues to be up and down.  Reports recent blood pressure readings of 180's/60's, 190's/60's.  Patient states her doctor made recent medication adjustment. She states her blood pressure will continue to be monitored at the dialysis center.  Patient states she has her physical scheduled for September 2024.   Patient denies any further needs or concerns and is agreeable that care coordination goals have been met.    Goals Addressed             This Visit's Progress    COMPLETED: management / education for health conditions.       Interventions Today    Flowsheet Row Most Recent Value  Chronic Disease   Chronic disease during today's visit Hypertension (HTN), Chronic Kidney Disease/End Stage Renal Disease (ESRD)  General Interventions   General Interventions Discussed/Reviewed General Interventions Reviewed, Doctor Visits  [evaluation of current treatment plan for HTN/ESRD and patients adherence to plan as established by provider.  Assessed blood pressure readings.]  Doctor Visits Discussed/Reviewed Doctor Visits Reviewed  Carrie [name] upcoming provider visits]  Pharmacy Interventions   Pharmacy Dicussed/Reviewed Pharmacy Topics Reviewed  [medications reviewed and compliance discussed.]              SDOH assessments and interventions completed:  No     Care Coordination Interventions:  Yes, provided   Follow up plan: No further intervention required.   Encounter Outcome:  Pt. Visit Completed   George Ina RN,BSN,CCM [facility] Care Coordination [phone] direct line

## 2022-12-11 NOTE — Patient Instructions (Signed)
Visit Information  Thank you for taking time to visit with me today. Please don't hesitate to contact me if I can be of assistance to you.   Following are the goals we discussed today:   Goals Addressed             This Visit's Progress    COMPLETED: management / education for health conditions.       Interventions Today    Flowsheet Row Most Recent Value  Chronic Disease   Chronic disease during today's visit Hypertension (HTN), Chronic Kidney Disease/End Stage Renal Disease (ESRD)  General Interventions   General Interventions Discussed/Reviewed General Interventions Reviewed, Doctor Visits  [evaluation of current treatment plan for HTN/ESRD and patients adherence to plan as established by provider.  Assessed blood pressure readings.]  Doctor Visits Discussed/Reviewed Doctor Visits Reviewed  [name] upcoming provider visits]  Pharmacy Interventions   Pharmacy Dicussed/Reviewed Pharmacy Topics Reviewed  [medications reviewed and compliance discussed.]               Please call the care guide team at [phone] if you need to cancel or reschedule your appointment.   If you are experiencing a Mental Health or Behavioral Health Crisis or need someone to talk to, please call the Suicide and Crisis Lifeline: 988 call 1-800-273-TALK (toll free, 24 hour hotline)  Patient verbalizes understanding of instructions and care plan provided today and agrees to view in MyChart. Active MyChart status and patient understanding of how to access instructions and care plan via MyChart confirmed with patient.     George Ina RN,BSN,CCM [facility] Care Coordination [phone] direct line

## 2022-12-12 DIAGNOSIS — N186 End stage renal disease: Secondary | ICD-10-CM | POA: Diagnosis not present

## 2022-12-12 DIAGNOSIS — Z992 Dependence on renal dialysis: Secondary | ICD-10-CM | POA: Diagnosis not present

## 2022-12-12 DIAGNOSIS — N2581 Secondary hyperparathyroidism of renal origin: Secondary | ICD-10-CM | POA: Diagnosis not present

## 2022-12-14 DIAGNOSIS — N186 End stage renal disease: Secondary | ICD-10-CM | POA: Diagnosis not present

## 2022-12-14 DIAGNOSIS — Z992 Dependence on renal dialysis: Secondary | ICD-10-CM | POA: Diagnosis not present

## 2022-12-14 DIAGNOSIS — N2581 Secondary hyperparathyroidism of renal origin: Secondary | ICD-10-CM | POA: Diagnosis not present

## 2022-12-17 DIAGNOSIS — Z992 Dependence on renal dialysis: Secondary | ICD-10-CM | POA: Diagnosis not present

## 2022-12-17 DIAGNOSIS — N186 End stage renal disease: Secondary | ICD-10-CM | POA: Diagnosis not present

## 2022-12-17 DIAGNOSIS — N2581 Secondary hyperparathyroidism of renal origin: Secondary | ICD-10-CM | POA: Diagnosis not present

## 2022-12-19 DIAGNOSIS — Z992 Dependence on renal dialysis: Secondary | ICD-10-CM | POA: Diagnosis not present

## 2022-12-19 DIAGNOSIS — N186 End stage renal disease: Secondary | ICD-10-CM | POA: Diagnosis not present

## 2022-12-19 DIAGNOSIS — N2581 Secondary hyperparathyroidism of renal origin: Secondary | ICD-10-CM | POA: Diagnosis not present

## 2022-12-20 ENCOUNTER — Encounter (INDEPENDENT_AMBULATORY_CARE_PROVIDER_SITE_OTHER): Payer: Medicare HMO

## 2022-12-20 ENCOUNTER — Telehealth: Payer: Self-pay

## 2022-12-20 ENCOUNTER — Ambulatory Visit (INDEPENDENT_AMBULATORY_CARE_PROVIDER_SITE_OTHER): Payer: Medicare HMO | Admitting: Vascular Surgery

## 2022-12-20 NOTE — Patient Outreach (Signed)
  Care Coordination   Follow Up Visit Note   12/20/2022 Name: Carrie [name] MRN: [id number] DOB: [birth year]  Carrie [name] is a 72 y.o. year old female who sees Karie Schwalbe, MD for primary care. I spoke with  Carrie [name] by phone today.  What matters to the patients health and wellness today?  Phone message received from patient requesting return call.  Call to patient.  Patient states she has not felt well this week. She states her oxygen level has been decreasing from 66 to 87, heart rate in the 70's and BP ranging 170's-180's/ 80's.  Patient states she reported these symptoms to her primary care provider office and has an appointment scheduled for 12/21/22.     Goals Addressed             This Visit's Progress    Care coordination activities       Interventions Today    Flowsheet Row Most Recent Value  Chronic Disease   Chronic disease during today's visit Hypertension (HTN)  General Interventions   General Interventions Discussed/Reviewed General Interventions Reviewed  [Assessed for BP, oxygen, pulse readings.  Patient advised to seek emergency assistance if severe chest pain / stroke like symptoms. Keep follow up appointment with provider.]              SDOH assessments and interventions completed:  No     Care Coordination Interventions:  Yes, provided   Follow up plan: Follow up call scheduled for 01/03/23    Encounter Outcome:  Pt. Visit Completed   George Ina RN,BSN,CCM [facility] Care Coordination [phone] direct line

## 2022-12-21 ENCOUNTER — Encounter: Payer: Self-pay | Admitting: Internal Medicine

## 2022-12-21 ENCOUNTER — Ambulatory Visit (INDEPENDENT_AMBULATORY_CARE_PROVIDER_SITE_OTHER): Payer: Medicare HMO | Admitting: Internal Medicine

## 2022-12-21 VITALS — BP 158/90 | HR 73 | Temp 97.8°F | Ht 60.25 in | Wt 213.0 lb

## 2022-12-21 DIAGNOSIS — Z992 Dependence on renal dialysis: Secondary | ICD-10-CM

## 2022-12-21 DIAGNOSIS — N186 End stage renal disease: Secondary | ICD-10-CM

## 2022-12-21 DIAGNOSIS — I5032 Chronic diastolic (congestive) heart failure: Secondary | ICD-10-CM

## 2022-12-21 DIAGNOSIS — I10 Essential (primary) hypertension: Secondary | ICD-10-CM | POA: Diagnosis not present

## 2022-12-21 DIAGNOSIS — N2581 Secondary hyperparathyroidism of renal origin: Secondary | ICD-10-CM | POA: Diagnosis not present

## 2022-12-21 MED ORDER — HYDRALAZINE HCL 25 MG PO TABS: 25.0000 mg | ORAL_TABLET | Freq: Three times a day (TID) | ORAL | 5 refills | Status: DC

## 2022-12-21 NOTE — Assessment & Plan Note (Signed)
Has had worsening with fluid accumulation, increased dyspnea, low oxygen sats Seems better with more aggressive dialysis Will send note to Mr Brion Aliment to see if he wants to do further evaluation Echo showed normal EF 1 year ago

## 2022-12-21 NOTE — Progress Notes (Signed)
Subjective:    Patient ID: Carrie [name], female    DOB: Jun 23, 1950, 72 y.o.   MRN: [id number]  HPI Here due to elevated blood pressure With sister  BP running high at home--and at dialysis Oxygen level has been dropping---they challenged her at dialysis and tried to take off more fluid Did have SOB upon arriving at dialysis --got oxygen till done with the dialysis Changed from carvedilol to metoprolol---and dose increased to 50mg  (yesterday)  No chest pain Does have fairly regular DOE  Current Outpatient Medications on File Prior to Visit  Medication Sig Dispense Refill   acetaminophen (TYLENOL) 500 MG tablet Take 1,000 mg by mouth every 6 (six) hours as needed for mild pain or moderate pain.     albuterol (VENTOLIN HFA) 108 (90 Base) MCG/ACT inhaler Inhale 2 puffs into the lungs every 6 (six) hours as needed for wheezing or shortness of breath. 8 g 2   amLODipine (NORVASC) 10 MG tablet Take 10 mg by mouth daily.     atorvastatin (LIPITOR) 40 MG tablet TAKE 40 MG BY MOUTH ONCE DAILY (Patient taking differently: Take 40 mg by mouth daily.) 90 tablet 3   levETIRAcetam (KEPPRA) 1000 MG tablet TAKE 1 TABLET BY MOUTH TWICE A DAY 180 tablet 3   lidocaine-prilocaine (EMLA) cream Apply 1 application topically every Monday, Wednesday, and Friday with hemodialysis.     LORazepam (ATIVAN) 0.5 MG tablet TAKE 1 TABLET BY MOUTH TWICE A DAY AS NEEDED FOR ANXIETY 60 tablet 0   losartan (COZAAR) 100 MG tablet Take 100 mg by mouth daily.     metoprolol succinate (TOPROL-XL) 50 MG 24 hr tablet Take 50 mg by mouth daily.     multivitamin (RENA-VIT) TABS tablet Take 1 tablet by mouth at bedtime. 30 tablet 0   omeprazole (PRILOSEC) 20 MG capsule TAKE 1 CAPSULE BY MOUTH EVERY DAY 90 capsule 3   ondansetron (ZOFRAN-ODT) 4 MG disintegrating tablet Take 4 mg by mouth every 8 (eight) hours as needed for nausea or vomiting.     sevelamer carbonate (RENVELA) 800 MG tablet Take 800-1,600 mg by mouth See admin  instructions. Take 2 tablets (1600 mg) by mouth three times daily with meals and take 1 tablet (800 mg) by mouth twice daily with snacks     torsemide (DEMADEX) 20 MG tablet Take 20 mg by mouth daily. Every day (including on dialysis days)     No current facility-administered medications on file prior to visit.    Allergies  Allergen Reactions   Naproxen Sodium Anaphylaxis and Swelling   Sulfamethoxazole-Trimethoprim Anaphylaxis and Swelling    Past Medical History:  Diagnosis Date   Allergy    Anemia    Arthritis    "right knee" (10/26'/2017)   Chronic heart failure with preserved ejection fraction (HFpEF) (HCC)    a. 04/2021 Echo: EF >55%, GrI DD, nl RV fxn, triv MR; b. 12/2021 Echo: EF 60-65%, no rwma.   Demand ischemia    a. 04/2021 HsTrop up to 73 in setting of volume overload; b. 10/2021 MV: EF 66% with small, mild reversible defect in the apical septal and mid anteroseptal myocardium which was felt to most likely represent artifact.   Depression    ESRD (end stage renal disease) (HCC)    a. OnHD (Dr. Lamont Dowdy)   History of blood transfusion 2008   "when I had brain tumor surgery"   History of MRSA infection 2008   History of stress test    a.  07/2018 MV: EF 66%, no ischemia/infarct.   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 12/2021 Echo: EF 60-65%, no rwma, mild LVH, nl RV fxn, small pericardial effusion - small pocket of post wall, ~ 1cm.   Precordial chest pain    a. 10/2021 MV: low risk.   Type II diabetes mellitus (HCC) 2011   on metformin until yr ago- no med now - off due to kidneys    Past Surgical History:  Procedure Laterality Date   A/V FISTULAGRAM Left 03/10/2019   Procedure: A/V FISTULAGRAM;  Surgeon: Renford Dills, MD;  Location: [facility];  Service: Cardiovascular;  Laterality: Left;   A/V FISTULAGRAM Left 07/14/2019   Procedure: A/V FISTULAGRAM;  Surgeon: Renford Dills, MD;  Location: [facility];  Service: Cardiovascular;  Laterality: Left;   A/V FISTULAGRAM Left 01/07/2020   Procedure: A/V FISTULAGRAM;  Surgeon: Renford Dills, MD;  Location: [facility];  Service: Cardiovascular;  Laterality: Left;   A/V FISTULAGRAM Left 11/14/2021   Procedure: A/V Fistulagram;  Surgeon: Renford Dills, MD;  Location: [facility];  Service: Cardiovascular;  Laterality: Left;   AV FISTULA PLACEMENT Left 11/07/2018   Procedure: ARTERIOVENOUS (AV) FISTULA CREATION ( BRACHIO BASILIC ) VS BRACHIAL AXILLARY GRAFT;  Surgeon: Renford Dills, MD;  Location: [facility];  Service: Vascular;  Laterality: Left;   BRAIN MENINGIOMA EXCISION  08/2006   Foramina magnum meningioma   CERVICAL DISCECTOMY  1996   Dr. Beverlyn Roux   CESAREAN SECTION  1981   COLONOSCOPY     COLONOSCOPY WITH PROPOFOL N/A 03/07/2021   Procedure: COLONOSCOPY WITH PROPOFOL;  Surgeon: Pasty Spillers, MD;  Location: [facility];  Service: Endoscopy;  Laterality: N/A;   DIALYSIS/PERMA CATHETER INSERTION N/A 08/04/2018   Procedure: DIALYSIS/PERMA CATHETER INSERTION;  Surgeon: Annice Needy, MD;  Location: [facility];  Service: Cardiovascular;  Laterality: N/A;   DIALYSIS/PERMA CATHETER REMOVAL N/A 12/25/2018   Procedure: DIALYSIS/PERMA CATHETER REMOVAL;  Surgeon: Annice Needy, MD;  Location: [facility];  Service: Cardiovascular;  Laterality: N/A;   DILATION AND CURETTAGE OF UTERUS     JOINT REPLACEMENT     right knee left hip   Right wrist x-ray  2007   Deg. at 1st MCP   TOTAL HIP ARTHROPLASTY Left 03/27/2016   Procedure: LEFT TOTAL HIP ARTHROPLASTY ANTERIOR APPROACH;  Surgeon: Kathryne Hitch, MD;  Location: [facility] OR;  Service: Orthopedics;  Laterality: Left;   TOTAL KNEE ARTHROPLASTY Right 04/30/2017   Procedure: RIGHT TOTAL KNEE ARTHROPLASTY;  Surgeon: Kathryne Hitch, MD;  Location: [facility] OR;  Service: Orthopedics;  Laterality: Right;   TOTAL KNEE ARTHROPLASTY Left  03/13/2022   Procedure: LEFT TOTAL KNEE ARTHROPLASTY;  Surgeon: Kathryne Hitch, MD;  Location: [facility] OR;  Service: Orthopedics;  Laterality: Left;   TUBAL LIGATION  1981    Family History  Problem Relation Age of Onset   Hypertension Other        Strong family history    Breast cancer Mother 34   Heart failure Sister    Heart disease Sister     Social History   Socioeconomic History   Marital status: Widowed    Spouse name: Not on file   Number of children: 1   Years of education: Not on file   Highest education level: 12th grade  Occupational History   Occupation:  Formerly Chief Operating Officer, 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      No living will   Requests niece, Radford Pax, as health care POA   Would accept resuscitation attempts   No tube feeds if cognitively aware   Social Determinants of Health   Financial Resource Strain: Medium Risk (12/20/2022)   Overall Financial Resource Strain (CARDIA)    Difficulty of Paying Living Expenses: Somewhat hard  Food Insecurity: Food Insecurity Present (12/20/2022)   Hunger Vital Sign    Worried About Running Out of Food in the Last Year: Never true    Ran Out of Food in the Last Year: Sometimes true  Transportation Needs: No Transportation Needs (12/20/2022)   PRAPARE - Administrator, Civil Service (Medical): No    Lack of Transportation (Non-Medical): No  Physical Activity: Insufficiently Active (12/20/2022)   Exercise Vital Sign    Days of Exercise per Week: 2 days    Minutes of Exercise per Session: 10 min   Stress: No Stress Concern Present (12/20/2022)   [employer] of Occupational Health - Occupational Stress Questionnaire    Feeling of Stress : Only a little  Social Connections: Moderately Isolated (12/20/2022)   Social Connection and Isolation Panel [NHANES]    Frequency of Communication with Friends and Family: Three times a week    Frequency of Social Gatherings with Friends and Family: More than three times a week    Attends Religious Services: More than 4 times per year    Active Member of [employer] or Organizations: No    Attends Banker Meetings: Not on file    Marital Status: Widowed  Intimate Partner Violence: Not At Risk (03/13/2022)   Humiliation, Afraid, Rape, and Kick questionnaire    Fear of Current or Ex-Partner: No    Emotionally Abused: No    Physically Abused: No    Sexually Abused: No   Review of Systems Weight has been down--checks regularly Appetite is poor--but eats regular Continuing with hydration Sleeps in recliner--no PND (but up to void)    Objective:   Physical Exam Constitutional:      Appearance: Normal appearance.  Cardiovascular:     Rate and Rhythm: Normal rate and regular rhythm.     Heart sounds: No murmur heard.    No gallop.  Pulmonary:     Effort: Pulmonary effort is normal.     Breath sounds: Normal breath sounds. No wheezing or rales.  Musculoskeletal:     Cervical back: Neck supple.     Right lower leg: No edema.     Left lower leg: No edema.  Lymphadenopathy:     Cervical: No cervical adenopathy.  Neurological:     Mental Status: She is alert.  Psychiatric:        Mood and Affect: Mood normal.        Behavior: Behavior normal.            Assessment & Plan:

## 2022-12-21 NOTE — Assessment & Plan Note (Addendum)
Running high now On losartan 100mg , metoprolol 50mg , amlodipine 10 Will add hydralazine 25 tid

## 2022-12-21 NOTE — Assessment & Plan Note (Signed)
Did have more aggressive dialysis Lungs are clear now This is the way we need to manage her fluid status

## 2022-12-24 ENCOUNTER — Telehealth: Payer: Self-pay | Admitting: *Deleted

## 2022-12-24 DIAGNOSIS — N2581 Secondary hyperparathyroidism of renal origin: Secondary | ICD-10-CM | POA: Diagnosis not present

## 2022-12-24 DIAGNOSIS — E119 Type 2 diabetes mellitus without complications: Secondary | ICD-10-CM | POA: Diagnosis not present

## 2022-12-24 DIAGNOSIS — Z992 Dependence on renal dialysis: Secondary | ICD-10-CM | POA: Diagnosis not present

## 2022-12-24 DIAGNOSIS — N186 End stage renal disease: Secondary | ICD-10-CM | POA: Diagnosis not present

## 2022-12-24 LAB — LAB REPORT - SCANNED: A1c: 5

## 2022-12-24 NOTE — Telephone Encounter (Signed)
-----   Message from Eula Listen sent at 12/21/2022 11:21 AM EDT ----- Please schedule patient to see Ward Givens for further evaluation of dyspnea. ----- Message ----- From: Karie Schwalbe, MD Sent: 12/21/2022  10:44 AM EDT To: Creig Hines, NP  Thayer Ohm, She has had elevated BP and fluid overload (better after more aggressive dialysis). Just letting you know in case you think she needs repeat cardiac work up (last echo 1 year ago) Retail banker

## 2022-12-24 NOTE — Telephone Encounter (Signed)
Patient scheduled first available to come and see Ward Givens NP. Will request wait list with high priority.

## 2022-12-26 DIAGNOSIS — N186 End stage renal disease: Secondary | ICD-10-CM | POA: Diagnosis not present

## 2022-12-26 DIAGNOSIS — Z992 Dependence on renal dialysis: Secondary | ICD-10-CM | POA: Diagnosis not present

## 2022-12-26 DIAGNOSIS — N2581 Secondary hyperparathyroidism of renal origin: Secondary | ICD-10-CM | POA: Diagnosis not present

## 2022-12-28 DIAGNOSIS — N2581 Secondary hyperparathyroidism of renal origin: Secondary | ICD-10-CM | POA: Diagnosis not present

## 2022-12-28 DIAGNOSIS — Z992 Dependence on renal dialysis: Secondary | ICD-10-CM | POA: Diagnosis not present

## 2022-12-28 DIAGNOSIS — N186 End stage renal disease: Secondary | ICD-10-CM | POA: Diagnosis not present

## 2022-12-29 ENCOUNTER — Encounter: Payer: Self-pay | Admitting: Internal Medicine

## 2022-12-29 ENCOUNTER — Other Ambulatory Visit: Payer: Self-pay

## 2022-12-29 ENCOUNTER — Observation Stay
Admission: EM | Admit: 2022-12-29 | Discharge: 2022-12-31 | Disposition: A | Discharge status: Home / Self Care | Payer: Medicare HMO | Attending: Internal Medicine | Admitting: Internal Medicine

## 2022-12-29 ENCOUNTER — Emergency Department: Payer: Medicare HMO

## 2022-12-29 DIAGNOSIS — R0989 Other specified symptoms and signs involving the circulatory and respiratory systems: Secondary | ICD-10-CM | POA: Diagnosis not present

## 2022-12-29 DIAGNOSIS — I5032 Chronic diastolic (congestive) heart failure: Secondary | ICD-10-CM | POA: Diagnosis present

## 2022-12-29 DIAGNOSIS — Z7722 Contact with and (suspected) exposure to environmental tobacco smoke (acute) (chronic): Secondary | ICD-10-CM | POA: Insufficient documentation

## 2022-12-29 DIAGNOSIS — E876 Hypokalemia: Secondary | ICD-10-CM | POA: Insufficient documentation

## 2022-12-29 DIAGNOSIS — E1122 Type 2 diabetes mellitus with diabetic chronic kidney disease: Secondary | ICD-10-CM | POA: Insufficient documentation

## 2022-12-29 DIAGNOSIS — Z992 Dependence on renal dialysis: Secondary | ICD-10-CM

## 2022-12-29 DIAGNOSIS — Z86018 Personal history of other benign neoplasm: Secondary | ICD-10-CM

## 2022-12-29 DIAGNOSIS — J811 Chronic pulmonary edema: Secondary | ICD-10-CM | POA: Diagnosis present

## 2022-12-29 DIAGNOSIS — Z6838 Body mass index (BMI) 38.0-38.9, adult: Secondary | ICD-10-CM | POA: Insufficient documentation

## 2022-12-29 DIAGNOSIS — I132 Hypertensive heart and chronic kidney disease with heart failure and with stage 5 chronic kidney disease, or end stage renal disease: Secondary | ICD-10-CM | POA: Diagnosis not present

## 2022-12-29 DIAGNOSIS — J810 Acute pulmonary edema: Principal | ICD-10-CM | POA: Insufficient documentation

## 2022-12-29 DIAGNOSIS — Z86011 Personal history of benign neoplasm of the brain: Secondary | ICD-10-CM | POA: Insufficient documentation

## 2022-12-29 DIAGNOSIS — I10 Essential (primary) hypertension: Secondary | ICD-10-CM | POA: Diagnosis present

## 2022-12-29 DIAGNOSIS — J9601 Acute respiratory failure with hypoxia: Secondary | ICD-10-CM

## 2022-12-29 DIAGNOSIS — I700 Atherosclerosis of aorta: Secondary | ICD-10-CM | POA: Diagnosis not present

## 2022-12-29 DIAGNOSIS — J90 Pleural effusion, not elsewhere classified: Secondary | ICD-10-CM | POA: Diagnosis not present

## 2022-12-29 DIAGNOSIS — Z79899 Other long term (current) drug therapy: Secondary | ICD-10-CM | POA: Insufficient documentation

## 2022-12-29 DIAGNOSIS — D649 Anemia, unspecified: Secondary | ICD-10-CM | POA: Diagnosis present

## 2022-12-29 DIAGNOSIS — G40909 Epilepsy, unspecified, not intractable, without status epilepticus: Secondary | ICD-10-CM

## 2022-12-29 DIAGNOSIS — Z96653 Presence of artificial knee joint, bilateral: Secondary | ICD-10-CM | POA: Insufficient documentation

## 2022-12-29 DIAGNOSIS — R0602 Shortness of breath: Secondary | ICD-10-CM | POA: Diagnosis not present

## 2022-12-29 DIAGNOSIS — N186 End stage renal disease: Secondary | ICD-10-CM | POA: Insufficient documentation

## 2022-12-29 DIAGNOSIS — E785 Hyperlipidemia, unspecified: Secondary | ICD-10-CM | POA: Diagnosis present

## 2022-12-29 DIAGNOSIS — M273 Alveolitis of jaws: Secondary | ICD-10-CM | POA: Insufficient documentation

## 2022-12-29 DIAGNOSIS — Z96652 Presence of left artificial knee joint: Secondary | ICD-10-CM

## 2022-12-29 DIAGNOSIS — Z96642 Presence of left artificial hip joint: Secondary | ICD-10-CM | POA: Diagnosis not present

## 2022-12-29 DIAGNOSIS — Z96651 Presence of right artificial knee joint: Secondary | ICD-10-CM

## 2022-12-29 LAB — CBC WITH DIFFERENTIAL/PLATELET
Abs Immature Granulocytes: 0.01 10*3/uL (ref 0.00–0.07)
Basophils Absolute: 0.1 10*3/uL (ref 0.0–0.1)
Basophils Relative: 1 %
Eosinophils Absolute: 0.2 10*3/uL (ref 0.0–0.5)
Eosinophils Relative: 3 %
HCT: 31.5 % — ABNORMAL LOW (ref 36.0–46.0)
Hemoglobin: 10.6 g/dL — ABNORMAL LOW (ref 12.0–15.0)
Immature Granulocytes: 0 %
Lymphocytes Relative: 23 %
Lymphs Abs: 1.4 10*3/uL (ref 0.7–4.0)
MCH: 31.4 pg (ref 26.0–34.0)
MCHC: 33.7 g/dL (ref 30.0–36.0)
MCV: 93.2 fL (ref 80.0–100.0)
Monocytes Absolute: 0.6 10*3/uL (ref 0.1–1.0)
Monocytes Relative: 10 %
Neutro Abs: 3.8 10*3/uL (ref 1.7–7.7)
Neutrophils Relative %: 63 %
Platelets: 162 10*3/uL (ref 150–400)
RBC: 3.38 MIL/uL — ABNORMAL LOW (ref 3.87–5.11)
RDW: 15.6 % — ABNORMAL HIGH (ref 11.5–15.5)
WBC: 6 10*3/uL (ref 4.0–10.5)
nRBC: 0 % (ref 0.0–0.2)

## 2022-12-29 LAB — BASIC METABOLIC PANEL
Anion gap: 10 (ref 5–15)
BUN: 27 mg/dL — ABNORMAL HIGH (ref 8–23)
CO2: 27 mmol/L (ref 22–32)
Calcium: 8.7 mg/dL — ABNORMAL LOW (ref 8.9–10.3)
Chloride: 98 mmol/L (ref 98–111)
Creatinine, Ser: 6.61 mg/dL — ABNORMAL HIGH (ref 0.44–1.00)
GFR, Estimated: 6 mL/min — ABNORMAL LOW (ref >60)
Glucose, Bld: 117 mg/dL — ABNORMAL HIGH (ref 70–99)
Potassium: 3.4 mmol/L — ABNORMAL LOW (ref 3.5–5.1)
Sodium: 135 mmol/L (ref 135–145)

## 2022-12-29 LAB — TROPONIN I (HIGH SENSITIVITY)
Troponin I (High Sensitivity): 12 ng/L (ref <18)
Troponin I (High Sensitivity): 12 ng/L (ref <18)

## 2022-12-29 LAB — PROCALCITONIN: Procalcitonin: 0.18 ng/mL

## 2022-12-29 MED ORDER — LORAZEPAM 2 MG/ML IJ SOLN: 2.0000 mg | INTRAMUSCULAR | Status: DC | PRN

## 2022-12-29 MED ORDER — SEVELAMER CARBONATE 800 MG PO TABS
1600.0000 mg | ORAL_TABLET | Freq: Three times a day (TID) | ORAL | Status: DC
  Administered 2022-12-29 – 2022-12-31 (×5): 1600 mg via ORAL
  Filled 2022-12-29 (×6): qty 2

## 2022-12-29 MED ORDER — ACETAMINOPHEN 500 MG PO TABS
1000.0000 mg | ORAL_TABLET | Freq: Four times a day (QID) | ORAL | Status: DC | PRN
  Administered 2022-12-30 (×3): 1000 mg via ORAL
  Filled 2022-12-29 (×3): qty 2

## 2022-12-29 MED ORDER — HEPARIN SODIUM (PORCINE) 1000 UNIT/ML DIALYSIS: 1000.0000 [IU] | INTRAMUSCULAR | Status: DC | PRN

## 2022-12-29 MED ORDER — PENTAFLUOROPROP-TETRAFLUOROETH EX AERO
INHALATION_SPRAY | CUTANEOUS | Status: AC
  Filled 2022-12-29: qty 30

## 2022-12-29 MED ORDER — LORAZEPAM 0.5 MG PO TABS: 0.5000 mg | ORAL_TABLET | Freq: Two times a day (BID) | ORAL | Status: DC | PRN

## 2022-12-29 MED ORDER — ACETAMINOPHEN 650 MG RE SUPP: 650.0000 mg | Freq: Four times a day (QID) | RECTAL | Status: DC | PRN

## 2022-12-29 MED ORDER — ALTEPLASE 2 MG IJ SOLR: 2.0000 mg | Freq: Once | INTRAMUSCULAR | Status: DC | PRN

## 2022-12-29 MED ORDER — CHLORHEXIDINE GLUCONATE CLOTH 2 % EX PADS
6.0000 | MEDICATED_PAD | Freq: Every day | CUTANEOUS | Status: DC
  Administered 2022-12-30 – 2022-12-31 (×2): 6 via TOPICAL

## 2022-12-29 MED ORDER — ACETAMINOPHEN 500 MG PO TABS: 1000.0000 mg | ORAL_TABLET | Freq: Four times a day (QID) | ORAL | Status: DC | PRN

## 2022-12-29 MED ORDER — SEVELAMER CARBONATE 800 MG PO TABS
800.0000 mg | ORAL_TABLET | Freq: Two times a day (BID) | ORAL | Status: DC
  Administered 2022-12-29 – 2022-12-30 (×3): 800 mg via ORAL
  Filled 2022-12-29 (×3): qty 1

## 2022-12-29 MED ORDER — ONDANSETRON HCL 4 MG PO TABS: 4.0000 mg | ORAL_TABLET | Freq: Four times a day (QID) | ORAL | Status: DC | PRN

## 2022-12-29 MED ORDER — LIDOCAINE HCL (PF) 1 % IJ SOLN: 5.0000 mL | INTRAMUSCULAR | Status: DC | PRN

## 2022-12-29 MED ORDER — HYDRALAZINE HCL 20 MG/ML IJ SOLN
5.0000 mg | Freq: Three times a day (TID) | INTRAMUSCULAR | Status: DC | PRN
  Administered 2022-12-30: 5 mg via INTRAVENOUS
  Filled 2022-12-29: qty 1

## 2022-12-29 MED ORDER — ACETAMINOPHEN 325 MG PO TABS
650.0000 mg | ORAL_TABLET | Freq: Four times a day (QID) | ORAL | Status: DC | PRN
  Administered 2022-12-29 (×2): 650 mg via ORAL
  Filled 2022-12-29 (×2): qty 2

## 2022-12-29 MED ORDER — METOPROLOL SUCCINATE ER 50 MG PO TB24
50.0000 mg | ORAL_TABLET | Freq: Every day | ORAL | Status: DC
  Administered 2022-12-30 – 2022-12-31 (×2): 50 mg via ORAL
  Filled 2022-12-29 (×2): qty 1

## 2022-12-29 MED ORDER — ANTICOAGULANT SODIUM CITRATE 4% (200MG/5ML) IV SOLN: 5.0000 mL | Status: DC | PRN

## 2022-12-29 MED ORDER — ATORVASTATIN CALCIUM 20 MG PO TABS
40.0000 mg | ORAL_TABLET | Freq: Every day | ORAL | Status: DC
  Administered 2022-12-29 – 2022-12-30 (×2): 40 mg via ORAL
  Filled 2022-12-29 (×2): qty 2

## 2022-12-29 MED ORDER — PENTAFLUOROPROP-TETRAFLUOROETH EX AERO: 1.0000 | INHALATION_SPRAY | CUTANEOUS | Status: DC | PRN

## 2022-12-29 MED ORDER — LEVETIRACETAM 500 MG PO TABS
1000.0000 mg | ORAL_TABLET | Freq: Two times a day (BID) | ORAL | Status: DC
  Administered 2022-12-29 – 2022-12-31 (×4): 1000 mg via ORAL
  Filled 2022-12-29 (×4): qty 2

## 2022-12-29 MED ORDER — TORSEMIDE 20 MG PO TABS
20.0000 mg | ORAL_TABLET | Freq: Every day | ORAL | Status: DC
  Administered 2022-12-30 – 2022-12-31 (×2): 20 mg via ORAL
  Filled 2022-12-29 (×2): qty 1

## 2022-12-29 MED ORDER — LOSARTAN POTASSIUM 50 MG PO TABS
100.0000 mg | ORAL_TABLET | Freq: Every day | ORAL | Status: DC
  Administered 2022-12-30 – 2022-12-31 (×2): 100 mg via ORAL
  Filled 2022-12-29 (×2): qty 2

## 2022-12-29 MED ORDER — AMOXICILLIN 500 MG PO CAPS
500.0000 mg | ORAL_CAPSULE | Freq: Three times a day (TID) | ORAL | Status: DC
  Administered 2022-12-29 – 2022-12-30 (×3): 500 mg via ORAL
  Filled 2022-12-29 (×4): qty 1

## 2022-12-29 MED ORDER — BENZOCAINE 10 % MT GEL
Freq: Four times a day (QID) | OROMUCOSAL | Status: DC | PRN
  Filled 2022-12-29: qty 9

## 2022-12-29 MED ORDER — OXYBUTYNIN CHLORIDE 5 MG PO TABS
2.5000 mg | ORAL_TABLET | Freq: Once | ORAL | Status: AC
  Administered 2022-12-29: 2.5 mg via ORAL
  Filled 2022-12-29: qty 0.5

## 2022-12-29 MED ORDER — HEPARIN SODIUM (PORCINE) 1000 UNIT/ML IJ SOLN
INTRAMUSCULAR | Status: AC
  Filled 2022-12-29: qty 10

## 2022-12-29 MED ORDER — ONDANSETRON HCL 4 MG/2ML IJ SOLN: 4.0000 mg | Freq: Four times a day (QID) | INTRAMUSCULAR | Status: DC | PRN

## 2022-12-29 MED ORDER — PANTOPRAZOLE SODIUM 40 MG PO TBEC
40.0000 mg | DELAYED_RELEASE_TABLET | Freq: Every day | ORAL | Status: DC
  Administered 2022-12-30 – 2022-12-31 (×2): 40 mg via ORAL
  Filled 2022-12-29 (×2): qty 1

## 2022-12-29 MED ORDER — HEPARIN SODIUM (PORCINE) 1000 UNIT/ML IJ SOLN
2000.0000 [IU] | Freq: Once | INTRAMUSCULAR | Status: AC
  Administered 2022-12-29: 2000 [IU]
  Filled 2022-12-29: qty 2

## 2022-12-29 MED ORDER — SENNOSIDES-DOCUSATE SODIUM 8.6-50 MG PO TABS: 1.0000 | ORAL_TABLET | Freq: Every evening | ORAL | Status: DC | PRN

## 2022-12-29 MED ORDER — RENA-VITE PO TABS
1.0000 | ORAL_TABLET | Freq: Every day | ORAL | Status: DC
  Administered 2022-12-29 – 2022-12-30 (×2): 1 via ORAL
  Filled 2022-12-29 (×2): qty 1

## 2022-12-29 MED ORDER — LIDOCAINE-PRILOCAINE 2.5-2.5 % EX CREA: 1.0000 | TOPICAL_CREAM | CUTANEOUS | Status: DC | PRN

## 2022-12-29 MED ORDER — HYDRALAZINE HCL 25 MG PO TABS
25.0000 mg | ORAL_TABLET | Freq: Three times a day (TID) | ORAL | Status: DC
  Administered 2022-12-29 – 2022-12-31 (×5): 25 mg via ORAL
  Filled 2022-12-29 (×5): qty 1

## 2022-12-29 MED ORDER — AMLODIPINE BESYLATE 10 MG PO TABS
10.0000 mg | ORAL_TABLET | Freq: Every day | ORAL | Status: DC
  Administered 2022-12-30 – 2022-12-31 (×2): 10 mg via ORAL
  Filled 2022-12-29: qty 2
  Filled 2022-12-29: qty 1

## 2022-12-29 MED ORDER — HEPARIN SODIUM (PORCINE) 5000 UNIT/ML IJ SOLN
5000.0000 [IU] | Freq: Three times a day (TID) | INTRAMUSCULAR | Status: DC
  Administered 2022-12-29 – 2022-12-30 (×4): 5000 [IU] via SUBCUTANEOUS
  Filled 2022-12-29 (×4): qty 1

## 2022-12-29 NOTE — Assessment & Plan Note (Addendum)
Patient takes Keppra 1000 mg p.o. twice daily, this has been resumed on admission Ativan 2 mg IV as needed for seizure, 2 doses ordered with instructions to administer as appropriate and then let provider know

## 2022-12-29 NOTE — Assessment & Plan Note (Signed)
Mild, will be corrected with HD; recheck bmp in the AM

## 2022-12-29 NOTE — Assessment & Plan Note (Signed)
Suspect secondary to pulmonary edema

## 2022-12-29 NOTE — Assessment & Plan Note (Signed)
-   Strict I's and O's

## 2022-12-29 NOTE — ED Triage Notes (Signed)
Pt to ED with daughter for low oxygen levels yesterday at dialysis. Had full dialysis session yesterday. L arm restricted. SPO2 88% on room air in triage, placed on 2L.  Pt also complains of bottom L dental pain and has dental problems. Started amox yesterday.

## 2022-12-29 NOTE — Assessment & Plan Note (Signed)
-   Atorvastatin 40 mg nightly resumed 

## 2022-12-29 NOTE — Assessment & Plan Note (Signed)
Patient is following with her outpatient dentist and is prescribed amoxicillin 500 mg 3 times daily, this has been resumed on admission Continue outpatient follow-up with dentist as appropriate

## 2022-12-29 NOTE — Assessment & Plan Note (Signed)
At baseline 

## 2022-12-29 NOTE — Progress Notes (Signed)
1610: HD RN came to ED to set up for dialysis but patient was ready to be moved to a regular room. Vital signs stable. DR. Suezanne Jacquet notified. HD was set up on patient's regular room instead.   Attempted to stick patient's venous but was unsuccessful on first attempt. A clot was found at the end of the needle. Second attempt was successful.

## 2022-12-29 NOTE — Assessment & Plan Note (Signed)
This meets criteria for morbid obesity based on the presence of 1 or more chronic comorbidities. Patient has HTN and BMI of 38.96. This complicates overall care and prognosis.

## 2022-12-29 NOTE — H&P (Addendum)
History and Physical   [name]:[id number] DOB: 1951/03/14 DOA: 12/29/2022  PCP: Karie Schwalbe, MD  Outpatient Specialists: Dr. Wyn Quaker, vascular specialist Patient coming from: home  I have personally briefly reviewed patient's old medical records in [facility] EMR.  Chief Concern: shortness of breath  HPI: [name] is a 72 year old female with history of end-stage renal disease on hemodialysis via left AV fistula, hypertension, heart failure preserved ejection fraction, hyperlipidemia, history of seizure disorder, anxiety, GERD, who presents to the emergency department for chief concerns of shortness of breath.  Per ED staff documentation: Patient was hypoxic, with SpO2 of 88% on room air.  This is new for the patient.  She was placed on 2 L nasal cannula with improvement to SpO2 of 97%.  Serum sodium 135, potassium 3.4, chloride 98, bicarb 27, BUN of 27, EGFR 6, serum creatinine is 6.61, nonfasting blood glucose 117.  WBC 6.0, hemoglobin 10.6, platelets of 162.  High sensitive troponin, initial value was 12.  ED discussed patient case with nephrology, who recommended hospital admission for dialysis.  ED medication treatment: None -------------------------- At bedside, patient is awake alert and oriented x 3.  She is sitting up in her ED hospital bed.  2 L nasal cannula in place.  She reports that the last 2 weeks she has had increasing shortness of breath.  Receiving dialysis minimally improves her symptoms.  She has been compliant with her dialysis treatments, and has had full treatments on Mondays, Wednesdays, Fridays as prescribed.  Patient endorses compliance with fluid restriction.  She denies cough, fever, chills, chest pain, diarrhea, swelling of the lower extremities.  Social history: She denies tobacco, EtOH, recreational drug use.  She is divorced.  ROS: Constitutional: no weight change, no fever ENT/Mouth: no sore throat, no rhinorrhea Eyes:  no eye pain, no vision changes Cardiovascular: no chest pain, + dyspnea,  no edema, no palpitations Respiratory: no cough, no sputum, no wheezing Gastrointestinal: no nausea, no vomiting, no diarrhea, no constipation Genitourinary: no urinary incontinence, no dysuria, no hematuria Musculoskeletal: no arthralgias, no myalgias Skin: no skin lesions, no pruritus, Neuro: + weakness, no loss of consciousness, no syncope Psych: no anxiety, no depression, no decrease appetite Heme/Lymph: no bruising, no bleeding  ED Course: Discussed with emergency medicine provider, patient requiring hospitalization for chief concerns of acute hypoxic respiratory failure presumed secondary to pulmonary edema.  Assessment/Plan  Principal Problem:   Pulmonary edema Active Problems:   Acute hypoxic respiratory failure (HCC)   History of meningioma   Essential hypertension   Morbid obesity (HCC)   Status post total replacement of left hip   Status post total right knee replacement   ESRD on hemodialysis (HCC)   Anemia   Seizure disorder (HCC)   Hyperlipidemia   Chronic diastolic heart failure (HCC)   Status post total left knee replacement   Hypokalemia   Infection of tooth socket   Assessment and Plan:  * Pulmonary edema Strict I's and O's, presumed secondary to volume overload Nephrology has been consulted via ED provider Admit to telemetry cardiac, observation  Acute hypoxic respiratory failure (HCC) Suspect secondary to pulmonary edema  Infection of tooth socket Patient is following with her outpatient dentist and is prescribed amoxicillin 500 mg 3 times daily, this has been resumed on admission Continue outpatient follow-up with dentist as appropriate  Hypokalemia Mild, will be corrected with HD; recheck bmp in the AM  Chronic diastolic heart failure (HCC) Strict I's and O's  Hyperlipidemia  Atorvastatin 40 mg nightly resumed  Seizure disorder [facility]) Patient takes Keppra 1000 mg p.o.  twice daily, this has been resumed on admission Ativan 2 mg IV as needed for seizure, 2 doses ordered with instructions to administer as appropriate and then let provider know  Anemia At baseline  ESRD on hemodialysis [facility]) Nephrology service has been consulted for end-stage renal disease on hemodialysis Sevelamer carbonate [phone] mg per home dosing resumed  Morbid obesity (HCC) This meets criteria for morbid obesity based on the presence of 1 or more chronic comorbidities. Patient has HTN and BMI of 38.96. This complicates overall care and prognosis.   Essential hypertension Amlodipine 10 mg daily, hydralazine 25 mg p.o. 3 times daily, losartan 100 mg daily, metoprolol succinate 50 mg daily, torsemide 20 mg daily resumed  Hydralazine 5 mg IV every 8 hours as needed for SBP greater 180, 4 days ordered  Chart reviewed.   DVT prophylaxis: Heparin 5000 units subcutaneous every 8 hours Code Status: Full code Diet: Renal Family Communication: Updated with patient's sisters at bedside with patient's permission Disposition Plan: Pending clinical course; anticipate discharge on 7/28 Consults called: Nephrology service Admission status: Observation, telemetry cardiac  Past Medical History:  Diagnosis Date   Allergy    Anemia    Arthritis    "right knee" (10/26'/2017)   Chronic heart failure with preserved ejection fraction (HFpEF) (HCC)    a. 04/2021 Echo: EF >55%, GrI DD, nl RV fxn, triv MR; b. 12/2021 Echo: EF 60-65%, no rwma.   Demand ischemia    a. 04/2021 HsTrop up to 73 in setting of volume overload; b. 10/2021 MV: EF 66% with small, mild reversible defect in the apical septal and mid anteroseptal myocardium which was felt to most likely represent artifact.   Depression    ESRD (end stage renal disease) (HCC)    a. OnHD (Dr. Lamont Dowdy)   History of blood transfusion 2008   "when I had brain tumor surgery"   History of MRSA infection 2008   History of stress test    a.  07/2018 MV: EF 66%, no ischemia/infarct.   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 12/2021 Echo: EF 60-65%, no rwma, mild LVH, nl RV fxn, small pericardial effusion - small pocket of post wall, ~ 1cm.   Precordial chest pain    a. 10/2021 MV: low risk.   Type II diabetes mellitus (HCC) 2011   on metformin until yr ago- no med now - off due to kidneys   Past Surgical History:  Procedure Laterality Date   A/V FISTULAGRAM Left 03/10/2019   Procedure: A/V FISTULAGRAM;  Surgeon: Renford Dills, MD;  Location: [facility];  Service: Cardiovascular;  Laterality: Left;   A/V FISTULAGRAM Left 07/14/2019   Procedure: A/V FISTULAGRAM;  Surgeon: Renford Dills, MD;  Location: [facility];  Service: Cardiovascular;  Laterality: Left;   A/V FISTULAGRAM Left 01/07/2020   Procedure: A/V FISTULAGRAM;  Surgeon: Renford Dills, MD;  Location: [facility];  Service: Cardiovascular;  Laterality: Left;   A/V FISTULAGRAM Left 11/14/2021   Procedure: A/V Fistulagram;  Surgeon: Renford Dills, MD;  Location: [facility];  Service: Cardiovascular;  Laterality: Left;   AV FISTULA PLACEMENT Left 11/07/2018   Procedure: ARTERIOVENOUS (AV) FISTULA CREATION ( BRACHIO BASILIC ) VS BRACHIAL AXILLARY GRAFT;  Surgeon: Renford Dills, MD;  Location: [facility] ORS;  Service:  Vascular;  Laterality: Left;   BRAIN MENINGIOMA EXCISION  08/2006   Foramina magnum meningioma   CERVICAL DISCECTOMY  1996   Dr. Beverlyn Roux   CESAREAN SECTION  1981   COLONOSCOPY     COLONOSCOPY WITH PROPOFOL N/A 03/07/2021   Procedure: COLONOSCOPY WITH PROPOFOL;  Surgeon: Pasty Spillers, MD;  Location: [facility] ENDOSCOPY;  Service: Endoscopy;  Laterality: N/A;   DIALYSIS/PERMA CATHETER INSERTION N/A 08/04/2018   Procedure: DIALYSIS/PERMA CATHETER INSERTION;  Surgeon: Annice Needy, MD;  Location: [facility];  Service: Cardiovascular;  Laterality: N/A;    DIALYSIS/PERMA CATHETER REMOVAL N/A 12/25/2018   Procedure: DIALYSIS/PERMA CATHETER REMOVAL;  Surgeon: Annice Needy, MD;  Location: [facility];  Service: Cardiovascular;  Laterality: N/A;   DILATION AND CURETTAGE OF UTERUS     JOINT REPLACEMENT     right knee left hip   Right wrist x-ray  2007   Deg. at 1st MCP   TOTAL HIP ARTHROPLASTY Left 03/27/2016   Procedure: LEFT TOTAL HIP ARTHROPLASTY ANTERIOR APPROACH;  Surgeon: Kathryne Hitch, MD;  Location: [facility] OR;  Service: Orthopedics;  Laterality: Left;   TOTAL KNEE ARTHROPLASTY Right 04/30/2017   Procedure: RIGHT TOTAL KNEE ARTHROPLASTY;  Surgeon: Kathryne Hitch, MD;  Location: [facility] OR;  Service: Orthopedics;  Laterality: Right;   TOTAL KNEE ARTHROPLASTY Left 03/13/2022   Procedure: LEFT TOTAL KNEE ARTHROPLASTY;  Surgeon: Kathryne Hitch, MD;  Location: [facility] OR;  Service: Orthopedics;  Laterality: Left;   TUBAL LIGATION  1981   Social History:  reports that she has never smoked. She has been exposed to tobacco smoke. She has never used smokeless tobacco. She reports that she does not drink alcohol and does not use drugs.  Allergies  Allergen Reactions   Naproxen Sodium Anaphylaxis and Swelling   Sulfamethoxazole-Trimethoprim Anaphylaxis and Swelling   Family History  Problem Relation Age of Onset   Hypertension Other        Strong family history    Breast cancer Mother 53   Heart failure Sister    Heart disease Sister    Family history: Family history reviewed and not pertinent.  Prior to Admission medications   Medication Sig Start Date End Date Taking? Authorizing Provider  acetaminophen (TYLENOL) 500 MG tablet Take 1,000 mg by mouth every 6 (six) hours as needed for mild pain or moderate pain.    [provider]  albuterol (VENTOLIN HFA) 108 (90 Base) MCG/ACT inhaler Inhale 2 puffs into the lungs every 6 (six) hours as needed for wheezing or shortness of breath. 05/05/21   Esaw Grandchild A, DO   amLODipine (NORVASC) 10 MG tablet Take 10 mg by mouth daily.    [provider]  atorvastatin (LIPITOR) 40 MG tablet TAKE 40 MG BY MOUTH ONCE DAILY Patient taking differently: Take 40 mg by mouth daily. 03/11/18   Karie Schwalbe, MD  hydrALAZINE (APRESOLINE) 25 MG tablet Take 1 tablet (25 mg total) by mouth 3 (three) times daily. 12/21/22   Karie Schwalbe, MD  levETIRAcetam (KEPPRA) 1000 MG tablet TAKE 1 TABLET BY MOUTH TWICE A DAY 03/01/22   Tillman Abide I, MD  lidocaine-prilocaine (EMLA) cream Apply 1 application topically every Monday, Wednesday, and Friday with hemodialysis.    [provider]  LORazepam (ATIVAN) 0.5 MG tablet TAKE 1 TABLET BY MOUTH TWICE A DAY AS NEEDED FOR ANXIETY 11/12/22   Karie Schwalbe, MD  losartan (COZAAR) 100 MG tablet Take 100 mg by mouth daily.  [provider]  metoprolol succinate (TOPROL-XL) 50 MG 24 hr tablet Take 50 mg by mouth daily. 12/19/22   [provider]  multivitamin (RENA-VIT) TABS tablet Take 1 tablet by mouth at bedtime. 08/07/18   Alford Highland, MD  omeprazole (PRILOSEC) 20 MG capsule TAKE 1 CAPSULE BY MOUTH EVERY DAY 11/28/21   Karie Schwalbe, MD  ondansetron (ZOFRAN-ODT) 4 MG disintegrating tablet Take 4 mg by mouth every 8 (eight) hours as needed for nausea or vomiting.    [provider]  sevelamer carbonate (RENVELA) 800 MG tablet Take 800-1,600 mg by mouth See admin instructions. Take 2 tablets (1600 mg) by mouth three times daily with meals and take 1 tablet (800 mg) by mouth twice daily with snacks    [provider]  torsemide (DEMADEX) 20 MG tablet Take 20 mg by mouth daily. Every day (including on dialysis days)    [provider]   Physical Exam: Vitals:   12/29/22 1333 12/29/22 1336 12/29/22 1337 12/29/22 1530  BP: (!) 164/78   (!) 158/73  Pulse: 79   75  Resp: 20   15  Temp: 98.7 F (37.1 C)   98.5 F (36.9 C)  TempSrc: Oral   Oral  SpO2: (!) 88%  97%  100%  Weight:  96.6 kg    Height:  5\' 2"  (1.575 m)     Constitutional: appears age-appropriate, frail, NAD, calm Eyes: PERRL, lids and conjunctivae normal ENMT: Mucous membranes are moist. Posterior pharynx clear of any exudate or lesions. Age-appropriate dentition with left sided, posterior mandibular wisdom tooth socket with infection. Hearing appropriate Neck: normal, supple, no masses, no thyromegaly Respiratory: Bilateral decreased lung sounds on auscultation, no wheezing, no crackles. Normal respiratory effort. No accessory muscle use.  2 L nasal cannula in place Cardiovascular: Regular rate and rhythm, no murmurs / rubs / gallops. No extremity edema. 2+ pedal pulses. No carotid bruits. + left AV fistula with appropriate bruit Abdomen: Markedly obese abdomen, no tenderness, no masses palpated, no hepatosplenomegaly. Bowel sounds positive.  Musculoskeletal: no clubbing / cyanosis. No joint deformity upper and lower extremities. Good ROM, no contractures, no atrophy. Normal muscle tone.  Skin: no rashes, lesions, ulcers. No induration Neurologic: Sensation intact. Strength 5/5 in all 4.  Psychiatric: Normal judgment and insight. Alert and oriented x 3.  Pleasant and happy mood.   EKG: independently reviewed, showing sinus rhythm with rate of 77, QTc 477  Chest x-ray on Admission: I personally reviewed and I agree with radiologist reading as below.  DG Chest Portable 1 View  Result Date: 12/29/2022 CLINICAL DATA:  Shortness of breath. EXAM: PORTABLE CHEST 1 VIEW COMPARISON:  X-ray 05/01/2021 FINDINGS: Enlarged cardiopericardial silhouette. Small effusions and adjacent opacities. Central vascular congestion. Calcified aorta. No pneumothorax. Kyphotic x-ray obscures the left lung apex. Vascular stent along the left axillary region. IMPRESSION: Enlarged cardiopericardial silhouette with small effusions, adjacent opacity and vascular congestion. Recommend follow-up Electronically Signed   By:  Karen Kays M.D.   On: 12/29/2022 14:46    Labs on Admission: I have personally reviewed following labs  CBC: Recent Labs  Lab 12/29/22 1403  WBC 6.0  NEUTROABS 3.8  HGB 10.6*  HCT 31.5*  MCV 93.2  PLT 162   Basic Metabolic Panel: Recent Labs  Lab 12/29/22 1403  NA 135  K 3.4*  CL 98  CO2 27  GLUCOSE 117*  BUN 27*  CREATININE 6.61*  CALCIUM 8.7*   GFR: Estimated Creatinine Clearance: 8.2 mL/min (A) (  by C-G formula based on SCr of 6.61 mg/dL (H)).  Urine analysis:    Component Value Date/Time   COLORURINE YELLOW 07/19/2006 2154   APPEARANCEUR CLEAR 07/19/2006 2154   LABSPEC 1.021 07/19/2006 2154   PHURINE 6.5 07/19/2006 2154   GLUCOSEU NEG mg/dL 84/13/2440 1027   BILIRUBINUR NEG 07/19/2006 2154   KETONESUR NEG mg/dL 25/36/6440 3474   PROTEINUR NEG mg/dL 25/95/6387 5643   UROBILINOGEN 0.2 07/19/2006 2154   NITRITE NEG 07/19/2006 2154   LEUKOCYTESUR NEG 07/19/2006 2154   This document was prepared using Dragon Voice Recognition software and may include unintentional dictation errors.  Dr. Sedalia Muta [facility]  If 7PM-7AM, please contact overnight-coverage provider If 7AM-7PM, please contact day attending provider [email or web address]  12/29/2022, 4:40 PM

## 2022-12-29 NOTE — Progress Notes (Addendum)
Referring Provider: No ref. provider found Primary Care Physician:  Karie Schwalbe, MD Primary Nephrologist:  Dr.   Jaquita Rector for Consultation:    HPI: 72 year old female with a past medical history diabetes, hypertension, coronary disease, congestive heart failure and end-stage renal disease on hemodialysis now comes to the hospital with shortness of breath.  She is on a Monday Wednesday Friday schedule for dialysis Full treatment yesterday.  Since this morning she continued to have shortness of breath and hence come to the emergency room.  She had a chest x-ray which showed volume overload.  She denies any chest pain, fever or chills at this time.  Past Medical History:  Diagnosis Date   Allergy    Anemia    Arthritis    "right knee" (10/26'/2017)   Chronic heart failure with preserved ejection fraction (HFpEF) (HCC)    a. 04/2021 Echo: EF >55%, GrI DD, nl RV fxn, triv MR; b. 12/2021 Echo: EF 60-65%, no rwma.   Demand ischemia    a. 04/2021 HsTrop up to 73 in setting of volume overload; b. 10/2021 MV: EF 66% with small, mild reversible defect in the apical septal and mid anteroseptal myocardium which was felt to most likely represent artifact.   Depression    ESRD (end stage renal disease) (HCC)    a. On[facility] (Dr. Lamont Dowdy)   History of blood transfusion 2008   "when I had brain tumor surgery"   History of MRSA infection 2008   History of stress test    a. 07/2018 MV: EF 66%, no ischemia/infarct.   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 12/2021 Echo: EF 60-65%, no rwma, mild LVH, nl RV fxn, small pericardial effusion - small pocket of post wall, ~ 1cm.   Precordial chest pain    a. 10/2021 MV: low risk.   Type II diabetes mellitus (HCC) 2011   on metformin until yr ago- no med now - off due to kidneys    Past Surgical History:  Procedure Laterality Date   A/V FISTULAGRAM Left 03/10/2019   Procedure: A/V FISTULAGRAM;  Surgeon: Renford Dills, MD;  Location: [facility];  Service: Cardiovascular;  Laterality: Left;   A/V FISTULAGRAM Left 07/14/2019   Procedure: A/V FISTULAGRAM;  Surgeon: Renford Dills, MD;  Location: [facility];  Service: Cardiovascular;  Laterality: Left;   A/V FISTULAGRAM Left 01/07/2020   Procedure: A/V FISTULAGRAM;  Surgeon: Renford Dills, MD;  Location: [facility];  Service: Cardiovascular;  Laterality: Left;   A/V FISTULAGRAM Left 11/14/2021   Procedure: A/V Fistulagram;  Surgeon: Renford Dills, MD;  Location: [facility];  Service: Cardiovascular;  Laterality: Left;   AV FISTULA PLACEMENT Left 11/07/2018   Procedure: ARTERIOVENOUS (AV) FISTULA CREATION ( BRACHIO BASILIC ) VS BRACHIAL AXILLARY GRAFT;  Surgeon: Renford Dills, MD;  Location: [facility];  Service: Vascular;  Laterality: Left;   BRAIN MENINGIOMA EXCISION  08/2006   Foramina magnum meningioma   CERVICAL DISCECTOMY  1996   Dr. Beverlyn Roux   CESAREAN SECTION  1981   COLONOSCOPY     COLONOSCOPY WITH PROPOFOL N/A 03/07/2021   Procedure: COLONOSCOPY WITH PROPOFOL;  Surgeon: Pasty Spillers, MD;  Location: [facility];  Service: Endoscopy;  Laterality: N/A;   DIALYSIS/PERMA CATHETER INSERTION N/A 08/04/2018   Procedure: DIALYSIS/PERMA CATHETER INSERTION;  Surgeon: Annice Needy, MD;  Location: [facility];  Service: Cardiovascular;  Laterality: N/A;   DIALYSIS/PERMA CATHETER REMOVAL N/A 12/25/2018   Procedure: DIALYSIS/PERMA CATHETER REMOVAL;  Surgeon: Annice Needy, MD;  Location: [facility];  Service: Cardiovascular;  Laterality: N/A;   DILATION AND CURETTAGE OF UTERUS     JOINT REPLACEMENT     right knee left hip   Right wrist x-ray  2007   Deg. at 1st MCP   TOTAL HIP ARTHROPLASTY Left 03/27/2016   Procedure: LEFT TOTAL HIP ARTHROPLASTY ANTERIOR APPROACH;  Surgeon: Kathryne Hitch, MD;  Location: [facility];  Service: Orthopedics;  Laterality: Left;   TOTAL KNEE  ARTHROPLASTY Right 04/30/2017   Procedure: RIGHT TOTAL KNEE ARTHROPLASTY;  Surgeon: Kathryne Hitch, MD;  Location: [facility];  Service: Orthopedics;  Laterality: Right;   TOTAL KNEE ARTHROPLASTY Left 03/13/2022   Procedure: LEFT TOTAL KNEE ARTHROPLASTY;  Surgeon: Kathryne Hitch, MD;  Location: [facility] OR;  Service: Orthopedics;  Laterality: Left;   TUBAL LIGATION  1981    Prior to Admission medications   Medication Sig Start Date End Date Taking? Authorizing Provider  acetaminophen (TYLENOL) 500 MG tablet Take 1,000 mg by mouth every 6 (six) hours as needed for mild pain or moderate pain.    [provider]  albuterol (VENTOLIN HFA) 108 (90 Base) MCG/ACT inhaler Inhale 2 puffs into the lungs every 6 (six) hours as needed for wheezing or shortness of breath. 05/05/21   Esaw Grandchild A, DO  amLODipine (NORVASC) 10 MG tablet Take 10 mg by mouth daily.    [provider]  atorvastatin (LIPITOR) 40 MG tablet TAKE 40 MG BY MOUTH ONCE DAILY Patient taking differently: Take 40 mg by mouth daily. 03/11/18   Karie Schwalbe, MD  hydrALAZINE (APRESOLINE) 25 MG tablet Take 1 tablet (25 mg total) by mouth 3 (three) times daily. 12/21/22   Karie Schwalbe, MD  levETIRAcetam (KEPPRA) 1000 MG tablet TAKE 1 TABLET BY MOUTH TWICE A DAY 03/01/22   Tillman Abide I, MD  lidocaine-prilocaine (EMLA) cream Apply 1 application topically every Monday, Wednesday, and Friday with hemodialysis.    [provider]  LORazepam (ATIVAN) 0.5 MG tablet TAKE 1 TABLET BY MOUTH TWICE A DAY AS NEEDED FOR ANXIETY 11/12/22   Karie Schwalbe, MD  losartan (COZAAR) 100 MG tablet Take 100 mg by mouth daily.    [provider]  metoprolol succinate (TOPROL-XL) 50 MG 24 hr tablet Take 50 mg by mouth daily. 12/19/22   [provider]  multivitamin (RENA-VIT) TABS tablet Take 1 tablet by mouth at bedtime. 08/07/18   Alford Highland, MD  omeprazole (PRILOSEC) 20 MG capsule TAKE 1 CAPSULE  BY MOUTH EVERY DAY 11/28/21   Karie Schwalbe, MD  ondansetron (ZOFRAN-ODT) 4 MG disintegrating tablet Take 4 mg by mouth every 8 (eight) hours as needed for nausea or vomiting.    [provider]  sevelamer carbonate (RENVELA) 800 MG tablet Take 800-1,600 mg by mouth See admin instructions. Take 2 tablets (1600 mg) by mouth three times daily with meals and take 1 tablet (800 mg) by mouth twice daily with snacks    [provider]  torsemide (DEMADEX) 20 MG tablet Take 20 mg by mouth daily. Every day (including on dialysis days)    [provider]    No current facility-administered medications for this encounter.   Current Outpatient Medications  Medication Sig Dispense Refill   acetaminophen (TYLENOL) 500 MG tablet Take 1,000 mg by mouth every 6 (six) hours  as needed for mild pain or moderate pain.     albuterol (VENTOLIN HFA) 108 (90 Base) MCG/ACT inhaler Inhale 2 puffs into the lungs every 6 (six) hours as needed for wheezing or shortness of breath. 8 g 2   amLODipine (NORVASC) 10 MG tablet Take 10 mg by mouth daily.     atorvastatin (LIPITOR) 40 MG tablet TAKE 40 MG BY MOUTH ONCE DAILY (Patient taking differently: Take 40 mg by mouth daily.) 90 tablet 3   hydrALAZINE (APRESOLINE) 25 MG tablet Take 1 tablet (25 mg total) by mouth 3 (three) times daily. 90 tablet 5   levETIRAcetam (KEPPRA) 1000 MG tablet TAKE 1 TABLET BY MOUTH TWICE A DAY 180 tablet 3   lidocaine-prilocaine (EMLA) cream Apply 1 application topically every Monday, Wednesday, and Friday with hemodialysis.     LORazepam (ATIVAN) 0.5 MG tablet TAKE 1 TABLET BY MOUTH TWICE A DAY AS NEEDED FOR ANXIETY 60 tablet 0   losartan (COZAAR) 100 MG tablet Take 100 mg by mouth daily.     metoprolol succinate (TOPROL-XL) 50 MG 24 hr tablet Take 50 mg by mouth daily.     multivitamin (RENA-VIT) TABS tablet Take 1 tablet by mouth at bedtime. 30 tablet 0   omeprazole (PRILOSEC) 20 MG capsule TAKE 1 CAPSULE BY MOUTH  EVERY DAY 90 capsule 3   ondansetron (ZOFRAN-ODT) 4 MG disintegrating tablet Take 4 mg by mouth every 8 (eight) hours as needed for nausea or vomiting.     sevelamer carbonate (RENVELA) 800 MG tablet Take 800-1,600 mg by mouth See admin instructions. Take 2 tablets (1600 mg) by mouth three times daily with meals and take 1 tablet (800 mg) by mouth twice daily with snacks     torsemide (DEMADEX) 20 MG tablet Take 20 mg by mouth daily. Every day (including on dialysis days)      Allergies as of 12/29/2022 - Review Complete 12/21/2022  Allergen Reaction Noted   Naproxen sodium Anaphylaxis and Swelling 09/26/2006   Sulfamethoxazole-trimethoprim Anaphylaxis and Swelling 09/26/2006    Family History  Problem Relation Age of Onset   Hypertension Other        Strong family history    Breast cancer Mother 12   Heart failure Sister    Heart disease Sister     Social History   Socioeconomic History   Marital status: Widowed    Spouse name: Not on file   Number of children: 1   Years of education: Not on file   Highest education level: 12th grade  Occupational History   Occupation: Formerly Chief Operating Officer, 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      No living will   Requests niece, Radford Pax, as health care POA   Would accept resuscitation attempts   No tube feeds if cognitively aware   Social Determinants of Health   Financial Resource Strain: Medium Risk (12/20/2022)   Overall Financial Resource Strain (CARDIA)    Difficulty of Paying Living Expenses: Somewhat hard  Food  Insecurity: Food  Insecurity Present (12/20/2022)   Hunger Vital Sign    Worried About Running Out of Food in the Last Year: Never true    Ran Out of Food in the Last Year: Sometimes true  Transportation Needs: No Transportation Needs (12/20/2022)   PRAPARE - Administrator, Civil Service (Medical): No    Lack of Transportation (Non-Medical): No  Physical Activity: Insufficiently Active (12/20/2022)   Exercise Vital Sign    Days of Exercise per Week: 2 days    Minutes of Exercise per Session: 10 min  Stress: No Stress Concern Present (12/20/2022)   [employer] of Occupational Health - Occupational Stress Questionnaire    Feeling of Stress : Only a little  Social Connections: Moderately Isolated (12/20/2022)   Social Connection and Isolation Panel [NHANES]    Frequency of Communication with Friends and Family: Three times a week    Frequency of Social Gatherings with Friends and Family: More than three times a week    Attends Religious Services: More than 4 times per year    Active Member of [employer] or Organizations: No    Attends Banker Meetings: Not on file    Marital Status: Widowed  Intimate Partner Violence: Not At Risk (03/13/2022)   Humiliation, Afraid, Rape, and Kick questionnaire    Fear of Current or Ex-Partner: No    Emotionally Abused: No    Physically Abused: No    Sexually Abused: No    Physical Exam: Vital signs in last 24 hours: Temp:  [98.7 F (37.1 C)] 98.7 F (37.1 C) (07/27 1333) Pulse Rate:  [79] 79 (07/27 1333) Resp:  [20] 20 (07/27 1333) BP: (164)/(78) 164/78 (07/27 1333) SpO2:  [88 %-97 %] 97 % (07/27 1337) Weight:  [96.6 kg] 96.6 kg (07/27 1336)   General:   Alert,  Well-developed, well-nourished, pleasant and cooperative in NAD Head:  Normocephalic and atraumatic. Eyes:  Sclera clear, no icterus.   Conjunctiva pink. Ears:  Normal auditory acuity. Nose:  No deformity, discharge,  or lesions. Lungs:  Clear throughout to  auscultation.   No wheezes, crackles, or rhonchi. No acute distress. Heart:  Regular rate and rhythm; no murmurs, clicks, rubs,  or gallops. Abdomen:  Soft, nontender and nondistended. No masses, hepatosplenomegaly or hernias noted. Normal bowel sounds, without guarding, and without rebound.   Extremities:  Without clubbing or edema.  Intake/Output from previous day: No intake/output data recorded. Intake/Output this shift: No intake/output data recorded.  Lab Results: Recent Labs    12/29/22 1403  WBC 6.0  HGB 10.6*  HCT 31.5*  PLT 162   BMET No results for input(s): "NA", "K", "CL", "CO2", "GLUCOSE", "BUN", "CREATININE", "CALCIUM", "PHOS" in the last 72 hours.  Invalid input(s): "MAG" LFT No results for input(s): "PROT", "ALBUMIN", "AST", "ALT", "ALKPHOS", "BILITOT", "BILIDIR", "IBILI" in the last 72 hours. PT/INR No results for input(s): "LABPROT", "INR" in the last 72 hours. Hepatitis Panel No results for input(s): "HEPBSAG", "HCVAB", "HEPAIGM", "HEPBIGM" in the last 72 hours.  Studies/Results: DG Chest Portable 1 View  Result Date: 12/29/2022 CLINICAL DATA:  Shortness of breath. EXAM: PORTABLE CHEST 1 VIEW COMPARISON:  X-ray 05/01/2021 FINDINGS: Enlarged cardiopericardial silhouette. Small effusions and adjacent opacities. Central vascular congestion. Calcified aorta. No pneumothorax. Kyphotic x-ray obscures the left lung apex. Vascular stent along the left axillary region. IMPRESSION: Enlarged cardiopericardial silhouette with small effusions, adjacent opacity and vascular congestion. Recommend follow-up Electronically Signed   By: Karen Kays M.D.   On: 12/29/2022 14:46  Assessment/Plan:  72 year old female with a past medical history diabetes, hypertension, coronary disease, congestive heart failure and end-stage renal disease on hemodialysis now comes to the hospital with shortness of breath.  She is on a Monday Wednesday Friday schedule for dialysis Full treatment  yesterday.  Since this morning she continued to have shortness of breath and hence come to the emergency room.  She had a chest x-ray which showed volume overload.  She denies any chest pain, fever or chills at this time.  ESRD: Patient is on a Monday Wednesday Friday schedule for dialysis.  She had a last dialysis yesterday.  Now she comes in with shortness of breath and volume overload.  Will dialyze her for 3 hours with 3 kg fluid removal.  ANEMIA: Anemia secondary to chronic kidney disease.  Will continue to follow the anemia protocols.  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  Continue sevelamer as ordered.  HTN/VOL: Continue present antihypertensive medications.  She is advised to stay on 2 g salt restricted diet.  Patient was advised to be careful with her fluid intake.  ACCESS access has been working well.  .  Will continue to monitor closely.    LOS: 0 Lorain Childes, MD [facility] @TODAY @3 :29 PM

## 2022-12-29 NOTE — Assessment & Plan Note (Signed)
Strict I's and O's, presumed secondary to volume overload Nephrology has been consulted via ED provider Admit to telemetry cardiac, observation

## 2022-12-29 NOTE — Hospital Course (Addendum)
[name] is a 72 year old female with history of end-stage renal disease on hemodialysis via left AV fistula, hypertension, heart failure preserved ejection fraction, hyperlipidemia, history of seizure disorder, anxiety, GERD, who presents to the emergency department for chief concerns of shortness of breath.  Per ED staff documentation: Patient was hypoxic, with SpO2 of 88% on room air.  This is new for the patient.  She was placed on 2 L nasal cannula with improvement to SpO2 of 97%.  Serum sodium 135, potassium 3.4, chloride 98, bicarb 27, BUN of 27, EGFR 6, serum creatinine is 6.61, nonfasting blood glucose 117.  WBC 6.0, hemoglobin 10.6, platelets of 162.  High sensitive troponin, initial value was 12.  ED discussed patient case with nephrology, who recommended hospital admission for dialysis.  ED medication treatment: None

## 2022-12-29 NOTE — Assessment & Plan Note (Addendum)
Nephrology service has been consulted for end-stage renal disease on hemodialysis Sevelamer carbonate [phone] mg per home dosing resumed

## 2022-12-29 NOTE — Progress Notes (Signed)
Received patient in bed  Alert and oriented x4 Informed consent signed and in chart 12/29/22  TX duration:3 hours  Patient tolerated well.   Alert, without acute distress.  Hand-off given to patient's nurse.   Access used: AVG Access issues: none  Total UF removed: 2400 Medication(s) given: 2000 units heparin bolus Post HD VS: see table below Post HD weight: 94.4kg standing scale   12/29/22 2123  Vitals  Temp 98.2 F (36.8 C)  Temp Source Oral  BP (!) 171/83  MAP (mmHg) 109  BP Location Right Arm  BP Method Automatic  Patient Position (if appropriate) Lying  Pulse Rate 78  Pulse Rate Source Monitor  ECG Heart Rate 76  Resp 20  Oxygen Therapy  SpO2 99 %  O2 Device Nasal Cannula  O2 Flow Rate (L/min) 2 L/min  Patient Activity (if Appropriate) In bed  Pulse Oximetry Type Continuous  During Treatment Monitoring  HD Safety Checks Performed Yes  Intra-Hemodialysis Comments Tolerated well  Post Treatment  Dialyzer Clearance Clear  Duration of HD Treatment -hour(s) 3 hour(s)  Hemodialysis Intake (mL) 100 mL  Liters Processed 72  Fluid Removed (mL) 2400 mL  Tolerated HD Treatment Yes  Post-Hemodialysis Comments goal not met  AVG/AVF Arterial Site Held (minutes) 4 minutes  AVG/AVF Venous Site Held (minutes) 6 minutes       [name]

## 2022-12-29 NOTE — ED Provider Notes (Signed)
[facility] Provider Note    Event Date/Time   First MD Initiated Contact with Patient 12/29/22 1350     (approximate)   History   hypoxia and Dental Pain   HPI  Carrie [name] is a 72 y.o. female with end-stage renal disease on dialysis Monday Wednesday Friday presents to the ER for evaluation of worsening shortness of breath now hypoxic at home.  Has been compliant with her medications and dialysis.  States that she was once on oxygen but was able to wean off of it.     Physical Exam   Triage Vital Signs: ED Triage Vitals  Encounter Vitals Group     BP 12/29/22 1333 (!) 164/78     Systolic BP Percentile --      Diastolic BP Percentile --      Pulse Rate 12/29/22 1333 79     Resp 12/29/22 1333 20     Temp 12/29/22 1333 98.7 F (37.1 C)     Temp Source 12/29/22 1333 Oral     SpO2 12/29/22 1333 (!) 88 %     Weight 12/29/22 1336 213 lb (96.6 kg)     Height 12/29/22 1336 5\' 2"  (1.575 m)     Head Circumference --      Peak Flow --      Pain Score 12/29/22 1335 10     Pain Loc --      Pain Education --      Exclude from Growth Chart --     Most recent vital signs: Vitals:   12/29/22 1333 12/29/22 1337  BP: (!) 164/78   Pulse: 79   Resp: 20   Temp: 98.7 F (37.1 C)   SpO2: (!) 88% 97%     Constitutional: Alert  Eyes: Conjunctivae are normal.  Head: Atraumatic. Nose: No congestion/rhinnorhea. Mouth/Throat: Mucous membranes are moist.   Neck: Painless ROM.  Cardiovascular:   Good peripheral circulation. Respiratory: Normal respiratory effort.  No retractions.  Gastrointestinal: Soft and nontender.  Musculoskeletal:  no deformity Neurologic:  MAE spontaneously. No gross focal neurologic deficits are appreciated.  Skin:  Skin is warm, dry and intact. No rash noted. Psychiatric: Mood and affect are normal. Speech and behavior are normal.    ED Results / Procedures / Treatments   Labs (all labs ordered are listed, but only  abnormal results are displayed) Labs Reviewed  CBC WITH DIFFERENTIAL/PLATELET - Abnormal; Notable for the following components:      Result Value   RBC 3.38 (*)    Hemoglobin 10.6 (*)    HCT 31.5 (*)    RDW 15.6 (*)    All other components within normal limits  BASIC METABOLIC PANEL  TROPONIN I (HIGH SENSITIVITY)     EKG  ED ECG REPORT I, Willy Eddy, the attending physician, personally viewed and interpreted this ECG.   Date: 12/29/2022  EKG Time: 15:28  Rate: 75  Rhythm: siuns  Axis: normal  Intervals: normal  ST&T Change: no stemi, no depressions    RADIOLOGY Please see ED Course for my review and interpretation.  I personally reviewed all radiographic images ordered to evaluate for the above acute complaints and reviewed radiology reports and findings.  These findings were personally discussed with the patient.  Please see medical record for radiology report.    PROCEDURES:  Critical Care performed: Yes, see critical care procedure note(s)  .Critical Care  Performed by: Willy Eddy, MD Authorized by: Willy Eddy, MD  Critical care provider statement:    Critical care time (minutes):  35   Critical care was necessary to treat or prevent imminent or life-threatening deterioration of the following conditions:  Respiratory failure   Critical care was time spent personally by me on the following activities:  Ordering and performing treatments and interventions, ordering and review of laboratory studies, ordering and review of radiographic studies, pulse oximetry, re-evaluation of patient's condition, review of old charts, obtaining history from patient or surrogate, examination of patient, evaluation of patient's response to treatment, discussions with primary provider, discussions with consultants and development of treatment plan with patient or surrogate    MEDICATIONS ORDERED IN ED: Medications - No data to display   IMPRESSION / MDM /  ASSESSMENT AND PLAN / ED COURSE  I reviewed the triage vital signs and the nursing notes.                              Differential diagnosis includes, but is not limited to, Asthma, copd, CHF, pna, ptx, malignancy, Pe, anemia  Patient presenting to the ER for evaluation of symptoms as described above.  Based on symptoms, risk factors and considered above differential, this presenting complaint could reflect a potentially life-threatening illness therefore the patient will be placed on continuous pulse oximetry and telemetry for monitoring.  Laboratory evaluation will be sent to evaluate for the above complaints.  Patient does have acute respiratory failure with hypoxia requiring supplemental oxygen.  Suspect volume overload despite dialysis.  Chest x-ray to my review and interpretation does show evidence of mild edema doubt infectious process or PE.  Will consult hospitalist for admission.  Will consult nephrology.       FINAL CLINICAL IMPRESSION(S) / ED DIAGNOSES   Final diagnoses:  Acute pulmonary edema (HCC)  Acute respiratory failure with hypoxia (HCC)     Rx / DC Orders   ED Discharge Orders     None        Note:  This document was prepared using Dragon voice recognition software and may include unintentional dictation errors.    Willy Eddy, MD 12/29/22 1536

## 2022-12-29 NOTE — Assessment & Plan Note (Addendum)
Amlodipine 10 mg daily, hydralazine 25 mg p.o. 3 times daily, losartan 100 mg daily, metoprolol succinate 50 mg daily, torsemide 20 mg daily resumed  Hydralazine 5 mg IV every 8 hours as needed for SBP greater 180, 4 days ordered

## 2022-12-30 DIAGNOSIS — J810 Acute pulmonary edema: Secondary | ICD-10-CM | POA: Diagnosis not present

## 2022-12-30 DIAGNOSIS — R609 Edema, unspecified: Secondary | ICD-10-CM | POA: Diagnosis not present

## 2022-12-30 DIAGNOSIS — J9601 Acute respiratory failure with hypoxia: Secondary | ICD-10-CM | POA: Diagnosis not present

## 2022-12-30 DIAGNOSIS — D631 Anemia in chronic kidney disease: Secondary | ICD-10-CM | POA: Diagnosis not present

## 2022-12-30 DIAGNOSIS — N186 End stage renal disease: Secondary | ICD-10-CM | POA: Diagnosis not present

## 2022-12-30 DIAGNOSIS — I10 Essential (primary) hypertension: Secondary | ICD-10-CM | POA: Diagnosis not present

## 2022-12-30 DIAGNOSIS — I509 Heart failure, unspecified: Secondary | ICD-10-CM | POA: Diagnosis not present

## 2022-12-30 LAB — CBC
HCT: 35.4 % — ABNORMAL LOW (ref 36.0–46.0)
Hemoglobin: 11.6 g/dL — ABNORMAL LOW (ref 12.0–15.0)
MCH: 31.4 pg (ref 26.0–34.0)
MCHC: 32.8 g/dL (ref 30.0–36.0)
MCV: 95.7 fL (ref 80.0–100.0)
Platelets: 163 10*3/uL (ref 150–400)
RBC: 3.7 MIL/uL — ABNORMAL LOW (ref 3.87–5.11)
RDW: 15.5 % (ref 11.5–15.5)
WBC: 4.7 10*3/uL (ref 4.0–10.5)
nRBC: 0 % (ref 0.0–0.2)

## 2022-12-30 LAB — BASIC METABOLIC PANEL WITH GFR
Anion gap: 11 (ref 5–15)
BUN: 20 mg/dL (ref 8–23)
CO2: 28 mmol/L (ref 22–32)
Calcium: 8.7 mg/dL — ABNORMAL LOW (ref 8.9–10.3)
Chloride: 96 mmol/L — ABNORMAL LOW (ref 98–111)
Creatinine, Ser: 5.29 mg/dL — ABNORMAL HIGH (ref 0.44–1.00)
GFR, Estimated: 8 mL/min — ABNORMAL LOW (ref >60)
Glucose, Bld: 158 mg/dL — ABNORMAL HIGH (ref 70–99)
Potassium: 3.8 mmol/L (ref 3.5–5.1)
Sodium: 135 mmol/L (ref 135–145)

## 2022-12-30 LAB — MAGNESIUM: Magnesium: 2.1 mg/dL (ref 1.7–2.4)

## 2022-12-30 LAB — HEPATITIS B SURFACE ANTIGEN: Hepatitis B Surface Ag: NONREACTIVE

## 2022-12-30 MED ORDER — HYDRALAZINE HCL 20 MG/ML IJ SOLN: 5.0000 mg | Freq: Three times a day (TID) | INTRAMUSCULAR | Status: DC | PRN

## 2022-12-30 MED ORDER — LORAZEPAM 2 MG/ML IJ SOLN: 2.0000 mg | INTRAMUSCULAR | Status: DC | PRN

## 2022-12-30 MED ORDER — AMOXICILLIN 500 MG PO CAPS
500.0000 mg | ORAL_CAPSULE | Freq: Two times a day (BID) | ORAL | Status: DC
  Administered 2022-12-30 – 2022-12-31 (×2): 500 mg via ORAL
  Filled 2022-12-30 (×2): qty 1

## 2022-12-30 NOTE — Progress Notes (Signed)
[facility]  PROGRESS NOTE   Subjective:   Patient seen at bedside.  Feels much better this morning.  Had stable dialysis last night and tolerated 2.5 L of fluid removed.  Objective:  Vital signs: Blood pressure (!) 140/76, pulse 66, temperature 98.2 F (36.8 C), temperature source Oral, resp. rate 20, height 5\' 2"  (1.575 m), weight 94.4 kg, SpO2 100%.  Intake/Output Summary (Last 24 hours) at 12/30/2022 1119 Last data filed at 12/29/2022 2123 Gross per 24 hour  Intake --  Output 2400 ml  Net -2400 ml   Filed Weights   12/29/22 1650 12/29/22 1654 12/29/22 2134  Weight: 96.8 kg 96.8 kg 94.4 kg     Physical Exam: General:  No acute distress  Head:  Normocephalic, atraumatic. Moist oral mucosal membranes  Eyes:  Anicteric  Neck:  Supple  Lungs:   Clear to auscultation, normal effort  Heart:  S1S2 no rubs  Abdomen:   Soft, nontender, bowel sounds present  Extremities:  peripheral edema.  Neurologic:  Awake, alert, following commands  Skin:  No lesions  Access:     Basic Metabolic Panel: Recent Labs  Lab 12/29/22 1403  NA 135  K 3.4*  CL 98  CO2 27  GLUCOSE 117*  BUN 27*  CREATININE 6.61*  CALCIUM 8.7*   GFR: Estimated Creatinine Clearance: 8.1 mL/min (A) (by C-G formula based on SCr of 6.61 mg/dL (H)).  Liver Function Tests: No results for input(s): "AST", "ALT", "ALKPHOS", "BILITOT", "PROT", "ALBUMIN" in the last 168 hours. No results for input(s): "LIPASE", "AMYLASE" in the last 168 hours. No results for input(s): "AMMONIA" in the last 168 hours.  CBC: Recent Labs  Lab 12/29/22 1403  WBC 6.0  NEUTROABS 3.8  HGB 10.6*  HCT 31.5*  MCV 93.2  PLT 162     HbA1C: Hemoglobin A1C  Date/Time Value Ref Range Status  01/20/2021 12:00 AM 5.4%  Final  01/29/2020 12:00 AM 5.7%  Final   Hgb A1c MFr Bld  Date/Time Value Ref Range Status  03/01/2022 03:00 PM 5.4 4.8 - 5.6 % Final    Comment:    (NOTE) Pre diabetes:           5.7%-6.4%  Diabetes:              >6.4%  Glycemic control for   <7.0% adults with diabetes     Urinalysis: No results for input(s): "COLORURINE", "LABSPEC", "PHURINE", "GLUCOSEU", "HGBUR", "BILIRUBINUR", "KETONESUR", "PROTEINUR", "UROBILINOGEN", "NITRITE", "LEUKOCYTESUR" in the last 72 hours.  Invalid input(s): "APPERANCEUR"    Imaging: DG Chest Portable 1 View  Result Date: 12/29/2022 CLINICAL DATA:  Shortness of breath. EXAM: PORTABLE CHEST 1 VIEW COMPARISON:  X-ray 05/01/2021 FINDINGS: Enlarged cardiopericardial silhouette. Small effusions and adjacent opacities. Central vascular congestion. Calcified aorta. No pneumothorax. Kyphotic x-ray obscures the left lung apex. Vascular stent along the left axillary region. IMPRESSION: Enlarged cardiopericardial silhouette with small effusions, adjacent opacity and vascular congestion. Recommend follow-up Electronically Signed   By: Karen Kays M.D.   On: 12/29/2022 14:46     Medications:    anticoagulant sodium citrate      amLODipine  10 mg Oral Daily   amoxicillin  500 mg Oral TID   atorvastatin  40 mg Oral QHS   Chlorhexidine Gluconate Cloth  6 each Topical Q0600   heparin  5,000 Units Subcutaneous Q8H   hydrALAZINE  25 mg Oral TID   levETIRAcetam  1,000 mg Oral BID   losartan  100 mg Oral Daily  metoprolol succinate  50 mg Oral Daily   multivitamin  1 tablet Oral QHS   pantoprazole  40 mg Oral Daily   sevelamer carbonate  1,600 mg Oral TID with meals   sevelamer carbonate  800 mg Oral BID   torsemide  20 mg Oral Daily    Assessment/ Plan:     72 year old female with a past medical history diabetes, hypertension, coronary disease, congestive heart failure and end-stage renal disease on hemodialysis now comes to the hospital with shortness of breath.  She is on a Monday Wednesday Friday schedule for dialysis Full treatment yesterday.  Since this morning she continued to have shortness of breath and hence come to the  emergency room.  She had a chest x-ray which showed volume overload.  She denies any chest pain, fever or chills at this time.   ESRD: Patient is on a Monday Wednesday Friday schedule for dialysis.  She was emergently dialyzed last night with 2.5 L of fluid removal and has significant. Improvement. Will dialyze again in AM.  ANEMIA: Anemia secondary to chronic kidney disease.  Will continue to follow the anemia protocols.   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  Continue sevelamer as ordered.   HTN/VOL: Continue present antihypertensive medications.  She is advised to stay on 2 g salt restricted diet.  Patient was advised to be careful with her fluid intake.  Spoke to family at bed side.    LOS: 0 Lorain Childes, MD [facility] 7/28/202411:19 AM

## 2022-12-30 NOTE — Care Management Obs Status (Signed)
MEDICARE OBSERVATION STATUS NOTIFICATION   Patient Details  Name: IDRIS KARPINSKI MRN: [id number] Date of Birth: [birth year]   Medicare Observation Status Notification Given:       Bing Quarry, RN 12/30/2022, 4:56 PM

## 2022-12-30 NOTE — Consult Note (Signed)
PHARMACY NOTE:  ANTIMICROBIAL RENAL DOSAGE ADJUSTMENT  Current antimicrobial regimen includes a mismatch between antimicrobial dosage and estimated renal function.  As per policy approved by the Pharmacy & Therapeutics and Medical Executive Committees, the antimicrobial dosage will be adjusted accordingly.  Current antimicrobial dosage: Amoxicillin 500 mg TID  Indication: Infection of tooth socket  Renal Function:  Estimated Creatinine Clearance: 10.1 mL/min (A) (by C-G formula based on SCr of 5.29 mg/dL (H)). [x]      On intermittent HD, scheduled: MWF []      On CRRT    Antimicrobial dosage has been changed to: Amoxicillin 500 mg BID  Thank you for allowing pharmacy to be a part of this patient's care.  Celene Squibb, PharmD Clinical Pharmacist 12/30/2022 2:50 PM

## 2022-12-30 NOTE — Progress Notes (Signed)
SATURATION QUALIFICATIONS: (This note is used to comply with regulatory documentation for home oxygen)  Patient Saturations on Room Air at Rest = 88%  Patient Saturations on Room Air while Ambulating = 0%  Patient Saturations on 0 Liters of oxygen while Ambulating = 0%  Please briefly explain why patient needs home oxygen: Pt oxygen at rest at 88%.

## 2022-12-30 NOTE — Progress Notes (Signed)
SATURATION QUALIFICATIONS: (This note is used to comply with regulatory documentation for home oxygen)  Patient Saturations on Room Air at Rest = 96%  Patient Saturations on Room Air while Ambulating =77 %  Patient Saturations on  2 Liters of oxygen while Ambulating = 95%  Please briefly explain why patient needs home oxygen: Patient will probably need oxygen when ambulating. She needs two liters of oxygen via nasal cannula to bring saturations to >90%

## 2022-12-30 NOTE — Progress Notes (Signed)
PROGRESS NOTE    Carrie [name]  KVQ:[id number] DOB: 12/31/1950 DOA: 12/29/2022 PCP: Karie Schwalbe, MD    Assessment & Plan:   Principal Problem:   Pulmonary edema Active Problems:   Acute hypoxic respiratory failure (HCC)   History of meningioma   Essential hypertension   Morbid obesity (HCC)   Status post total replacement of left hip   Status post total right knee replacement   ESRD on hemodialysis (HCC)   Anemia   Seizure disorder (HCC)   Hyperlipidemia   Chronic diastolic heart failure (HCC)   Status post total left knee replacement   Hypokalemia   Infection of tooth socket  Assessment and Plan:  Pulmonary edema: fluid management w/ HD. Monitor I/Os   Acute hypoxic respiratory failure: likely secondary to pulmonary edema. Continue on supplemental oxygen and wean as tolerated   Infection of tooth socket: continue on amoxicillin. Will need to f/u outpatient w/ dentist    Hypokalemia: will be managed w/ HD    Chronic diastolic CHF: fluid management w/ HD. Monitor I/Os    HLD: continue on statin    Seizure disorder: continue on keppra. Ativan prn    ACD: likely secondary to ESRD. No need for a transfusion currently    ESRD: on HD. Nephro following and recs apprec   Morbid obesity: BMI 38.0. Has the presence of 1 or mor chronic comorbidities. Complicates overall care & prognosis    HTN: continue on amlodipine, hydralazine, losartan, metoprolol, torsemide. IV hydralazine prn   ACD: likely secondary to ESRD. No transfusion indicated at this time    DVT prophylaxis:  heparin SQ Code Status: full  Family Communication: discuss pt's care w/ pt's family at bedside and answered their questions  Disposition Plan: likely d/c back home  Level of care: Telemetry Cardiac Status is: Observation The patient remains OBS appropriate and will d/c before 2 midnights.    Consultants:  Nephro   Procedures:   Antimicrobials:    Subjective: Pt c/o fatigue    Objective: Vitals:   12/29/22 2123 12/29/22 2134 12/30/22 0441 12/30/22 0533  BP: (!) 171/83  (!) 163/78 (!) 179/87  Pulse: 78  73 74  Resp: 20  18 16   Temp: 98.2 F (36.8 C)  98.7 F (37.1 C) 98 F (36.7 C)  TempSrc: Oral  Oral   SpO2: 99%  99% 100%  Weight:  94.4 kg    Height:        Intake/Output Summary (Last 24 hours) at 12/30/2022 0724 Last data filed at 12/29/2022 2123 Gross per 24 hour  Intake --  Output 2400 ml  Net -2400 ml   Filed Weights   12/29/22 1650 12/29/22 1654 12/29/22 2134  Weight: 96.8 kg 96.8 kg 94.4 kg    Examination:  General exam: Appears calm and comfortable  Respiratory system: decreased breath sounds b/l  Cardiovascular system: S1 & S2+. No rubs, gallops or clicks. Gastrointestinal system: Abdomen is obese, soft and nontender. Normal bowel sounds heard. Central nervous system: Alert and oriented. Moves all extremities  Psychiatry: Judgement and insight appear normal. Mood & affect appropriate.     Data Reviewed: I have personally reviewed following labs and imaging studies  CBC: Recent Labs  Lab 12/29/22 1403  WBC 6.0  NEUTROABS 3.8  HGB 10.6*  HCT 31.5*  MCV 93.2  PLT 162   Basic Metabolic Panel: Recent Labs  Lab 12/29/22 1403  NA 135  K 3.4*  CL 98  CO2 27  GLUCOSE 117*  BUN 27*  CREATININE 6.61*  CALCIUM 8.7*   GFR: Estimated Creatinine Clearance: 8.1 mL/min (A) (by C-G formula based on SCr of 6.61 mg/dL (H)). Liver Function Tests: No results for input(s): "AST", "ALT", "ALKPHOS", "BILITOT", "PROT", "ALBUMIN" in the last 168 hours. No results for input(s): "LIPASE", "AMYLASE" in the last 168 hours. No results for input(s): "AMMONIA" in the last 168 hours. Coagulation Profile: No results for input(s): "INR", "PROTIME" in the last 168 hours. Cardiac Enzymes: No results for input(s): "CKTOTAL", "CKMB", "CKMBINDEX", "TROPONINI" in the last 168 hours. BNP (last 3 results) No results for input(s): "PROBNP" in the  last 8760 hours. HbA1C: No results for input(s): "HGBA1C" in the last 72 hours. CBG: No results for input(s): "GLUCAP" in the last 168 hours. Lipid Profile: No results for input(s): "CHOL", "HDL", "LDLCALC", "TRIG", "CHOLHDL", "LDLDIRECT" in the last 72 hours. Thyroid Function Tests: No results for input(s): "TSH", "T4TOTAL", "FREET4", "T3FREE", "THYROIDAB" in the last 72 hours. Anemia Panel: No results for input(s): "VITAMINB12", "FOLATE", "FERRITIN", "TIBC", "IRON", "RETICCTPCT" in the last 72 hours. Sepsis Labs: Recent Labs  Lab 12/29/22 1552  PROCALCITON 0.18    No results found for this or any previous visit (from the past 240 hour(s)).       Radiology Studies: DG Chest Portable 1 View  Result Date: 12/29/2022 CLINICAL DATA:  Shortness of breath. EXAM: PORTABLE CHEST 1 VIEW COMPARISON:  X-ray 05/01/2021 FINDINGS: Enlarged cardiopericardial silhouette. Small effusions and adjacent opacities. Central vascular congestion. Calcified aorta. No pneumothorax. Kyphotic x-ray obscures the left lung apex. Vascular stent along the left axillary region. IMPRESSION: Enlarged cardiopericardial silhouette with small effusions, adjacent opacity and vascular congestion. Recommend follow-up Electronically Signed   By: Karen Kays M.D.   On: 12/29/2022 14:46        Scheduled Meds:  amLODipine  10 mg Oral Daily   amoxicillin  500 mg Oral TID   atorvastatin  40 mg Oral QHS   Chlorhexidine Gluconate Cloth  6 each Topical Q0600   heparin  5,000 Units Subcutaneous Q8H   hydrALAZINE  25 mg Oral TID   levETIRAcetam  1,000 mg Oral BID   losartan  100 mg Oral Daily   metoprolol succinate  50 mg Oral Daily   multivitamin  1 tablet Oral QHS   pantoprazole  40 mg Oral Daily   sevelamer carbonate  1,600 mg Oral TID with meals   sevelamer carbonate  800 mg Oral BID   torsemide  20 mg Oral Daily   Continuous Infusions:  anticoagulant sodium citrate       LOS: 0 days    Time spent: 35 mins      Charise Killian, MD [facility] Pager [phone]  If 7PM-7AM, please contact night-coverage [email or web address] 12/30/2022, 7:24 AM

## 2022-12-30 NOTE — Plan of Care (Signed)

## 2022-12-31 DIAGNOSIS — J810 Acute pulmonary edema: Secondary | ICD-10-CM | POA: Diagnosis not present

## 2022-12-31 DIAGNOSIS — I509 Heart failure, unspecified: Secondary | ICD-10-CM | POA: Diagnosis not present

## 2022-12-31 DIAGNOSIS — J9601 Acute respiratory failure with hypoxia: Secondary | ICD-10-CM | POA: Diagnosis not present

## 2022-12-31 DIAGNOSIS — N186 End stage renal disease: Secondary | ICD-10-CM | POA: Diagnosis not present

## 2022-12-31 DIAGNOSIS — I132 Hypertensive heart and chronic kidney disease with heart failure and with stage 5 chronic kidney disease, or end stage renal disease: Secondary | ICD-10-CM | POA: Diagnosis not present

## 2022-12-31 DIAGNOSIS — N2581 Secondary hyperparathyroidism of renal origin: Secondary | ICD-10-CM | POA: Diagnosis not present

## 2022-12-31 DIAGNOSIS — Z992 Dependence on renal dialysis: Secondary | ICD-10-CM | POA: Diagnosis not present

## 2022-12-31 DIAGNOSIS — E119 Type 2 diabetes mellitus without complications: Secondary | ICD-10-CM | POA: Diagnosis not present

## 2022-12-31 DIAGNOSIS — D631 Anemia in chronic kidney disease: Secondary | ICD-10-CM | POA: Diagnosis not present

## 2022-12-31 LAB — CBC
HCT: 31.8 % — ABNORMAL LOW (ref 36.0–46.0)
Hemoglobin: 10.2 g/dL — ABNORMAL LOW (ref 12.0–15.0)
MCH: 30.4 pg (ref 26.0–34.0)
MCHC: 32.1 g/dL (ref 30.0–36.0)
MCV: 94.9 fL (ref 80.0–100.0)
Platelets: 158 10*3/uL (ref 150–400)
RBC: 3.35 MIL/uL — ABNORMAL LOW (ref 3.87–5.11)
RDW: 15.3 % (ref 11.5–15.5)
WBC: 4.7 10*3/uL (ref 4.0–10.5)
nRBC: 0 % (ref 0.0–0.2)

## 2022-12-31 LAB — RENAL FUNCTION PANEL
Albumin: 3.7 g/dL (ref 3.5–5.0)
Anion gap: 12 (ref 5–15)
BUN: 33 mg/dL — ABNORMAL HIGH (ref 8–23)
CO2: 26 mmol/L (ref 22–32)
Calcium: 8.6 mg/dL — ABNORMAL LOW (ref 8.9–10.3)
Chloride: 96 mmol/L — ABNORMAL LOW (ref 98–111)
Creatinine, Ser: 7.51 mg/dL — ABNORMAL HIGH (ref 0.44–1.00)
GFR, Estimated: 5 mL/min — ABNORMAL LOW (ref >60)
Glucose, Bld: 99 mg/dL (ref 70–99)
Phosphorus: 5.3 mg/dL — ABNORMAL HIGH (ref 2.5–4.6)
Potassium: 3.8 mmol/L (ref 3.5–5.1)
Sodium: 134 mmol/L — ABNORMAL LOW (ref 135–145)

## 2022-12-31 MED ORDER — PENTAFLUOROPROP-TETRAFLUOROETH EX AERO: 1.0000 | INHALATION_SPRAY | CUTANEOUS | Status: DC | PRN

## 2022-12-31 MED ORDER — PENTAFLUOROPROP-TETRAFLUOROETH EX AERO
INHALATION_SPRAY | CUTANEOUS | Status: AC
  Filled 2022-12-31: qty 30

## 2022-12-31 MED ORDER — LIDOCAINE-PRILOCAINE 2.5-2.5 % EX CREA
1.0000 | TOPICAL_CREAM | CUTANEOUS | Status: DC | PRN
  Filled 2022-12-31: qty 5

## 2022-12-31 MED ORDER — ALTEPLASE 2 MG IJ SOLR: 2.0000 mg | Freq: Once | INTRAMUSCULAR | Status: DC | PRN

## 2022-12-31 MED ORDER — ANTICOAGULANT SODIUM CITRATE 4% (200MG/5ML) IV SOLN
5.0000 mL | Status: DC | PRN
  Filled 2022-12-31: qty 5

## 2022-12-31 MED ORDER — HEPARIN SODIUM (PORCINE) 1000 UNIT/ML DIALYSIS: 1000.0000 [IU] | INTRAMUSCULAR | Status: DC | PRN

## 2022-12-31 MED ORDER — LIDOCAINE HCL (PF) 1 % IJ SOLN
5.0000 mL | INTRAMUSCULAR | Status: DC | PRN
  Filled 2022-12-31: qty 5

## 2022-12-31 NOTE — Plan of Care (Signed)
  Problem: Education: Goal: Knowledge of General Education information will improve Description: Including pain rating scale, medication(s)/side effects and non-pharmacologic comfort measures Outcome: Progressing   Problem: Clinical Measurements: Goal: Will remain free from infection Outcome: Progressing   Problem: Clinical Measurements: Goal: Respiratory complications will improve Outcome: Progressing   Problem: Clinical Measurements: Goal: Cardiovascular complication will be avoided Outcome: Progressing   Problem: Nutrition: Goal: Adequate nutrition will be maintained Outcome: Progressing   Problem: Pain Managment: Goal: General experience of comfort will improve Outcome: Progressing   Problem: Safety: Goal: Ability to remain free from injury will improve Outcome: Progressing   

## 2022-12-31 NOTE — Progress Notes (Signed)
0930: patient complained on cramps on her toes. UF paused. Cramps subsided after 15 mins and UF was resumed. Decreased goal to 2.5 L.

## 2022-12-31 NOTE — Progress Notes (Signed)
[facility]  PROGRESS NOTE   Subjective:   Patient seen and evaluated during dialysis   HEMODIALYSIS FLOWSHEET:  Blood Flow Rate (mL/min): 400 mL/min Arterial Pressure (mmHg): -250 mmHg Venous Pressure (mmHg): 220 mmHg TMP (mmHg): 16 mmHg Ultrafiltration Rate (mL/min): 974 mL/min Dialysate Flow Rate (mL/min): 300 ml/min Dialysis Fluid Bolus: Normal Saline Bolus Amount (mL): 100 mL  Reported mild shortness of breath during treatment HD RN placed 2L [place] for comfort No other complaints  Objective:  Vital signs: Blood pressure (!) 178/77, pulse 71, temperature 97.8 F (36.6 C), resp. rate 18, height 5\' 2"  (1.575 m), weight 92.8 kg, SpO2 93%.  Intake/Output Summary (Last 24 hours) at 12/31/2022 1333 Last data filed at 12/31/2022 1126 Gross per 24 hour  Intake 240 ml  Output 2500 ml  Net -2260 ml   Filed Weights   12/29/22 2134 12/31/22 0739 12/31/22 1126  Weight: 94.4 kg 95.4 kg 92.8 kg     Physical Exam: General:  No acute distress  Head:  Normocephalic, atraumatic. Moist oral mucosal membranes  Eyes:  Anicteric  Neck:  Supple  Lungs:   Clear to auscultation, normal effort  Heart:  S1S2 no rubs  Abdomen:   Soft, nontender, bowel sounds present  Extremities:  Trace peripheral edema.  Neurologic:  Awake, alert, following commands  Skin:  No lesions  Access: Lt AVG    Basic Metabolic Panel: Recent Labs  Lab 12/29/22 1403 12/30/22 1328 12/31/22 0751  NA 135 135 134*  K 3.4* 3.8 3.8  CL 98 96* 96*  CO2 27 28 26   GLUCOSE 117* 158* 99  BUN 27* 20 33*  CREATININE 6.61* 5.29* 7.51*  CALCIUM 8.7* 8.7* 8.6*  MG  --  2.1  --   PHOS  --   --  5.3*   GFR: Estimated Creatinine Clearance: 7.1 mL/min (A) (by C-G formula based on SCr of 7.51 mg/dL (H)).  Liver Function Tests: Recent Labs  Lab 12/31/22 0751  ALBUMIN 3.7   No results for input(s): "LIPASE", "AMYLASE" in the last 168 hours. No results for input(s): "AMMONIA" in the last 168  hours.  CBC: Recent Labs  Lab 12/29/22 1403 12/30/22 1328 12/31/22 0751  WBC 6.0 4.7 4.7  NEUTROABS 3.8  --   --   HGB 10.6* 11.6* 10.2*  HCT 31.5* 35.4* 31.8*  MCV 93.2 95.7 94.9  PLT 162 163 158     HbA1C: Hemoglobin A1C  Date/Time Value Ref Range Status  01/20/2021 12:00 AM 5.4%  Final  01/29/2020 12:00 AM 5.7%  Final   Hgb A1c MFr Bld  Date/Time Value Ref Range Status  03/01/2022 03:00 PM 5.4 4.8 - 5.6 % Final    Comment:    (NOTE) Pre diabetes:          5.7%-6.4%  Diabetes:              >6.4%  Glycemic control for   <7.0% adults with diabetes     Urinalysis: No results for input(s): "COLORURINE", "LABSPEC", "PHURINE", "GLUCOSEU", "HGBUR", "BILIRUBINUR", "KETONESUR", "PROTEINUR", "UROBILINOGEN", "NITRITE", "LEUKOCYTESUR" in the last 72 hours.  Invalid input(s): "APPERANCEUR"    Imaging: DG Chest Portable 1 View  Result Date: 12/29/2022 CLINICAL DATA:  Shortness of breath. EXAM: PORTABLE CHEST 1 VIEW COMPARISON:  X-ray 05/01/2021 FINDINGS: Enlarged cardiopericardial silhouette. Small effusions and adjacent opacities. Central vascular congestion. Calcified aorta. No pneumothorax. Kyphotic x-ray obscures the left lung apex. Vascular stent along the left axillary region. IMPRESSION: Enlarged cardiopericardial silhouette with small effusions,  adjacent opacity and vascular congestion. Recommend follow-up Electronically Signed   By: Karen Kays M.D.   On: 12/29/2022 14:46     Medications:    anticoagulant sodium citrate      amLODipine  10 mg Oral Daily   amoxicillin  500 mg Oral Q12H   atorvastatin  40 mg Oral QHS   Chlorhexidine Gluconate Cloth  6 each Topical Q0600   heparin  5,000 Units Subcutaneous Q8H   hydrALAZINE  25 mg Oral TID   levETIRAcetam  1,000 mg Oral BID   losartan  100 mg Oral Daily   metoprolol succinate  50 mg Oral Daily   multivitamin  1 tablet Oral QHS   pantoprazole  40 mg Oral Daily   sevelamer carbonate  1,600 mg Oral TID with  meals   sevelamer carbonate  800 mg Oral BID   torsemide  20 mg Oral Daily    Assessment/ Plan:     72 year old female with a past medical history diabetes, hypertension, coronary disease, congestive heart failure and end-stage renal disease on hemodialysis now comes to the hospital with shortness of breath.  She is on a Monday Wednesday Friday schedule for dialysis Full treatment yesterday.  Since this morning she continued to have shortness of breath and hence come to the emergency room.  She had a chest x-ray which showed volume overload.  She denies any chest pain, fever or chills at this time.   ESRD on hemodialysis: Patient is on a Monday Wednesday Friday schedule for dialysis.  Receiving dialysis earlier today, UF 2.5L achieved. Supplemental oxygen placed during treatment and weaned prior to completion. Next treatment scheduled for Wednesday.   ANEMIA with chronic kidney disease.  Hgb remains at goal. 10.2   Secondary Hyperparathyroidism : Calcium and phosphorus within optimal range. Continue sevelamer as ordered.   Hypertension with chronic kidney disease: Continue present antihypertensive medications.      LOS: 0 [facility] 7/29/20241:33 PM

## 2022-12-31 NOTE — Discharge Summary (Signed)
Physician Discharge Summary  DI BAGOT ZOX:[id number] DOB: [birth year] DOA: 12/29/2022  PCP: Karie Schwalbe, MD  Admit date: 12/29/2022 Discharge date: 12/31/2022  Admitted From: home  Disposition:  home   Recommendations for Outpatient Follow-up:  Follow up with PCP in 1-2 weeks F/u w/ nephro in 1-2 weeks   Home Health: no  Equipment/Devices: 2L [place]   Discharge Condition: stable  CODE STATUS: full  Diet recommendation: Heart Healthy / renal diet   Brief/Interim Summary: HPI was taken from Dr. Sedalia Muta: Carrie [name] is a 72 year old female with history of end-stage renal disease on hemodialysis via left AV fistula, hypertension, heart failure preserved ejection fraction, hyperlipidemia, history of seizure disorder, anxiety, GERD, who presents to the emergency department for chief concerns of shortness of breath.   Per ED staff documentation: Patient was hypoxic, with SpO2 of 88% on room air.  This is new for the patient.  She was placed on 2 L nasal cannula with improvement to SpO2 of 97%.   Serum sodium 135, potassium 3.4, chloride 98, bicarb 27, BUN of 27, EGFR 6, serum creatinine is 6.61, nonfasting blood glucose 117.   WBC 6.0, hemoglobin 10.6, platelets of 162.   High sensitive troponin, initial value was 12.   ED discussed patient case with nephrology, who recommended hospital admission for dialysis.   ED medication treatment: None -------------------------- At bedside, patient is awake alert and oriented x 3.  She is sitting up in her ED hospital bed.  2 L nasal cannula in place.   She reports that the last 2 weeks she has had increasing shortness of breath.  Receiving dialysis minimally improves her symptoms.  She has been compliant with her dialysis treatments, and has had full treatments on Mondays, Wednesdays, Fridays as prescribed.   Patient endorses compliance with fluid restriction.   She denies cough, fever, chills, chest pain, diarrhea, swelling of the  lower extremities.  Discharge Diagnoses:  Principal Problem:   Pulmonary edema Active Problems:   Acute hypoxic respiratory failure (HCC)   History of meningioma   Essential hypertension   Morbid obesity (HCC)   Status post total replacement of left hip   Status post total right knee replacement   ESRD on hemodialysis (HCC)   Anemia   Seizure disorder (HCC)   Hyperlipidemia   Chronic diastolic heart failure (HCC)   Status post total left knee replacement   Hypokalemia   Infection of tooth socket  Pulmonary edema: fluid management w/ HD. Monitor I/Os. Resolved    Acute hypoxic respiratory failure: likely secondary to pulmonary edema. Unable to be weaned from supplemental oxygen. Will d/c home w/ 2L [place]   Infection of tooth socket: continue on amoxicillin. Will need to f/u outpatient w/ dentist    Hypokalemia: will be managed w/ HD    Chronic diastolic CHF: fluid management w/ HD. Monitor I/Os    HLD: continue on statin    Seizure disorder: continue on keppra. Ativan prn    ACD: likely secondary to ESRD. No need for a transfusion currently    ESRD: on HD. Nephro following and recs apprec   Morbid obesity: BMI 38.0. Has the presence of 1 or mor chronic comorbidities. Complicates overall care & prognosis    HTN: continue on amlodipine, hydralazine, losartan, metoprolol, torsemide. IV hydralazine prn     Discharge Instructions  Discharge Instructions     Diet - low sodium heart healthy   Complete by: As directed    W/ renal diet  Discharge instructions   Complete by: As directed    F/u w/ PCP in 1-2 weeks. F/u w/ nephro in 1-2 weeks   Increase activity slowly   Complete by: As directed    No wound care   Complete by: As directed       Allergies as of 12/31/2022       Reactions   Naproxen Sodium Anaphylaxis, Swelling   Sulfamethoxazole-trimethoprim Anaphylaxis, Swelling        Medication List     STOP taking these medications    carvedilol 12.5 MG  tablet Commonly known as: COREG       TAKE these medications    acetaminophen 500 MG tablet Commonly known as: TYLENOL Take 1,000 mg by mouth every 6 (six) hours as needed for mild pain or moderate pain.   albuterol 108 (90 Base) MCG/ACT inhaler Commonly known as: VENTOLIN HFA Inhale 2 puffs into the lungs every 6 (six) hours as needed for wheezing or shortness of breath.   amLODipine 10 MG tablet Commonly known as: NORVASC Take 10 mg by mouth daily.   amoxicillin 500 MG capsule Commonly known as: AMOXIL Take 500 mg by mouth 3 (three) times daily.   atorvastatin 40 MG tablet Commonly known as: LIPITOR TAKE 40 MG BY MOUTH ONCE DAILY What changed: See the new instructions.   hydrALAZINE 25 MG tablet Commonly known as: APRESOLINE Take 1 tablet (25 mg total) by mouth 3 (three) times daily.   levETIRAcetam 1000 MG tablet Commonly known as: KEPPRA TAKE 1 TABLET BY MOUTH TWICE A DAY   lidocaine-prilocaine cream Commonly known as: EMLA Apply 1 application topically every Monday, Wednesday, and Friday with hemodialysis.   LORazepam 0.5 MG tablet Commonly known as: ATIVAN TAKE 1 TABLET BY MOUTH TWICE A DAY AS NEEDED FOR ANXIETY What changed: See the new instructions.   losartan 100 MG tablet Commonly known as: COZAAR Take 100 mg by mouth daily.   metoprolol succinate 50 MG 24 hr tablet Commonly known as: TOPROL-XL Take 50 mg by mouth daily.   multivitamin Tabs tablet Take 1 tablet by mouth at bedtime.   omeprazole 20 MG capsule Commonly known as: PRILOSEC TAKE 1 CAPSULE BY MOUTH EVERY DAY What changed: how much to take   ondansetron 4 MG disintegrating tablet Commonly known as: ZOFRAN-ODT Take 4 mg by mouth every 8 (eight) hours as needed for nausea or vomiting.   sevelamer carbonate 800 MG tablet Commonly known as: RENVELA Take 800-1,600 mg by mouth See admin instructions. Take 2 tablets (1600 mg) by mouth three times daily with meals and take 1 tablet (800  mg) by mouth twice daily with snacks   torsemide 20 MG tablet Commonly known as: DEMADEX Take 20 mg by mouth daily. Every day (including on dialysis days)               Durable Medical Equipment  (From admission, onward)           Start     Ordered   12/31/22 0913  For home use only DME oxygen  Once       Question Answer Comment  Length of Need Lifetime   Mode or (Route) Nasal cannula   Liters per Minute 2   Frequency Continuous (stationary and portable oxygen unit needed)   Oxygen conserving device Yes   Oxygen delivery system Gas      12/31/22 0912            Allergies  Allergen Reactions  Naproxen Sodium Anaphylaxis and Swelling   Sulfamethoxazole-Trimethoprim Anaphylaxis and Swelling    Consultations: Nephro    Procedures/Studies: DG Chest Portable 1 View  Result Date: 12/29/2022 CLINICAL DATA:  Shortness of breath. EXAM: PORTABLE CHEST 1 VIEW COMPARISON:  X-ray 05/01/2021 FINDINGS: Enlarged cardiopericardial silhouette. Small effusions and adjacent opacities. Central vascular congestion. Calcified aorta. No pneumothorax. Kyphotic x-ray obscures the left lung apex. Vascular stent along the left axillary region. IMPRESSION: Enlarged cardiopericardial silhouette with small effusions, adjacent opacity and vascular congestion. Recommend follow-up Electronically Signed   By: Karen Kays M.D.   On: 12/29/2022 14:46   (Echo, Carotid, EGD, Colonoscopy, ERCP)    Subjective: Pt c/o fatigue    Discharge Exam: Vitals:   12/31/22 1126 12/31/22 1226  BP: (!) 147/75 (!) 178/77  Pulse: 69 71  Resp: 16 18  Temp: 97.6 F (36.4 C) 97.8 F (36.6 C)  SpO2: 99% 93%   Vitals:   12/31/22 1030 12/31/22 1100 12/31/22 1126 12/31/22 1226  BP: (!) 167/74 (!) 153/72 (!) 147/75 (!) 178/77  Pulse: 63 65 69 71  Resp: 15 16 16 18   Temp:   97.6 F (36.4 C) 97.8 F (36.6 C)  TempSrc:   Axillary   SpO2: 100% 95% 99% 93%  Weight:   92.8 kg   Height:        General:  Pt is alert, awake, not in acute distress Cardiovascular: S1/S2 +, no rubs, no gallops Respiratory: decreased breath sounds b/l  Abdominal: Soft, NT, obese, bowel sounds + Extremities: no cyanosis    The results of significant diagnostics from this hospitalization (including imaging, microbiology, ancillary and laboratory) are listed below for reference.     Microbiology: No results found for this or any previous visit (from the past 240 hour(s)).   Labs: BNP (last 3 results) No results for input(s): "BNP" in the last 8760 hours. Basic Metabolic Panel: Recent Labs  Lab 12/29/22 1403 12/30/22 1328 12/31/22 0751  NA 135 135 134*  K 3.4* 3.8 3.8  CL 98 96* 96*  CO2 27 28 26   GLUCOSE 117* 158* 99  BUN 27* 20 33*  CREATININE 6.61* 5.29* 7.51*  CALCIUM 8.7* 8.7* 8.6*  MG  --  2.1  --   PHOS  --   --  5.3*   Liver Function Tests: Recent Labs  Lab 12/31/22 0751  ALBUMIN 3.7   No results for input(s): "LIPASE", "AMYLASE" in the last 168 hours. No results for input(s): "AMMONIA" in the last 168 hours. CBC: Recent Labs  Lab 12/29/22 1403 12/30/22 1328 12/31/22 0751  WBC 6.0 4.7 4.7  NEUTROABS 3.8  --   --   HGB 10.6* 11.6* 10.2*  HCT 31.5* 35.4* 31.8*  MCV 93.2 95.7 94.9  PLT 162 163 158   Cardiac Enzymes: No results for input(s): "CKTOTAL", "CKMB", "CKMBINDEX", "TROPONINI" in the last 168 hours. BNP: Invalid input(s): "POCBNP" CBG: No results for input(s): "GLUCAP" in the last 168 hours. D-Dimer No results for input(s): "DDIMER" in the last 72 hours. Hgb A1c No results for input(s): "HGBA1C" in the last 72 hours. Lipid Profile No results for input(s): "CHOL", "HDL", "LDLCALC", "TRIG", "CHOLHDL", "LDLDIRECT" in the last 72 hours. Thyroid function studies No results for input(s): "TSH", "T4TOTAL", "T3FREE", "THYROIDAB" in the last 72 hours.  Invalid input(s): "FREET3" Anemia work up No results for input(s): "VITAMINB12", "FOLATE", "FERRITIN", "TIBC",  "IRON", "RETICCTPCT" in the last 72 hours. Urinalysis    Component Value Date/Time   COLORURINE YELLOW 07/19/2006 2154  APPEARANCEUR CLEAR 07/19/2006 2154   LABSPEC 1.021 07/19/2006 2154   PHURINE 6.5 07/19/2006 2154   GLUCOSEU NEG mg/dL 12/02/1599 0932   BILIRUBINUR NEG 07/19/2006 2154   KETONESUR NEG mg/dL 35/57/3220 2542   PROTEINUR NEG mg/dL 70/62/3762 8315   UROBILINOGEN 0.2 07/19/2006 2154   NITRITE NEG 07/19/2006 2154   LEUKOCYTESUR NEG 07/19/2006 2154   Sepsis Labs Recent Labs  Lab 12/29/22 1403 12/30/22 1328 12/31/22 0751  WBC 6.0 4.7 4.7   Microbiology No results found for this or any previous visit (from the past 240 hour(s)).   Time coordinating discharge: Over 30 minutes  SIGNED:   Charise Killian, MD  [facility] 12/31/2022, 2:12 PM Pager   If 7PM-7AM, please contact night-coverage [email or web address]

## 2022-12-31 NOTE — Progress Notes (Signed)
Hemodialysis note  Received patient in bed to unit. Alert and oriented.  Informed consent signed and in chart.  Treatment initiated: 0752 Treatment completed: 1126  Patient tolerated well. Transported back to room, alert without acute distress.  Report given to patient's RN.   Access used: LUA AVG Access issues: none  Total UF removed: 2.5 L Medication(s) given:  none  Post HD weight: 92.8 kg   [name]

## 2022-12-31 NOTE — Plan of Care (Signed)

## 2022-12-31 NOTE — TOC Progression Note (Signed)
Transition of Care [facility]) - Progression Note    Patient Details  Name: Carrie [name] MRN: [id number] Date of Birth: 1951-01-11  Transition of Care [facility]) CM/SW Contact  Darolyn Rua, [place] Phone Number: 12/31/2022, 12:00 PM  Clinical Narrative:     2L O2 ordered via [employer] in anticipation of discharge after HD today.   No further needs noted at this time.       Expected Discharge Plan and Services                                               Social Determinants of Health (SDOH) Interventions SDOH Screenings   Food Insecurity: Food Insecurity Present (12/29/2022)  Housing: Patient Declined (12/29/2022)  Transportation Needs: No Transportation Needs (12/29/2022)  Utilities: Not At Risk (12/29/2022)  Depression (PHQ2-9): Low Risk  (02/20/2022)  Financial Resource Strain: Medium Risk (12/20/2022)  Physical Activity: Insufficiently Active (12/20/2022)  Social Connections: Moderately Isolated (12/20/2022)  Stress: No Stress Concern Present (12/20/2022)  Tobacco Use: Low Risk  (12/29/2022)    Readmission Risk Interventions     No data to display

## 2023-01-01 ENCOUNTER — Ambulatory Visit: Payer: Medicare HMO | Attending: Cardiovascular Disease | Admitting: Cardiovascular Disease

## 2023-01-01 ENCOUNTER — Encounter: Payer: Self-pay | Admitting: Cardiovascular Disease

## 2023-01-01 VITALS — BP 110/50 | HR 68 | Ht 62.0 in | Wt 209.0 lb

## 2023-01-01 DIAGNOSIS — I10 Essential (primary) hypertension: Secondary | ICD-10-CM

## 2023-01-01 DIAGNOSIS — I5032 Chronic diastolic (congestive) heart failure: Secondary | ICD-10-CM | POA: Diagnosis not present

## 2023-01-01 LAB — HEPATITIS B SURFACE ANTIBODY, QUANTITATIVE: Hep B S AB Quant (Post): 18.8 m[IU]/mL

## 2023-01-01 NOTE — Progress Notes (Signed)
Evaluation Performed:  Follow-up visit  Date:  01/01/2023   ID:  Carrie [name], DOB 1951-05-04, MRN [id number]  Patient Location:  [address] [place] [address]   Provider location:   [facility], [place] office  PCP:  Karie Schwalbe, MD  Cardiologist:  Hubbard Robinson [facility]  Chief Complaint  Patient presents with   12 month follow up     Patient was at [facility] 12/29/2022 with hypoxia/acute pulmonary edema. Patient c/o shortness of breath with over exertion.     History of Present Illness:    Carrie [name] is a 72 y.o. female with past medical history of anemia Morbid obesity Chronic neck pain History of brain cancer per the patient Seizure history, on Keppra Diabetes type 2 End-stage renal disease on hemodialysis Who presents for worsening shortness of breath, diastolic CHF  Last seen in clinic by one of our providers July 2023  Admission to the hospital July 2020 for discharge yesterday December 31, 2022 Pulmonary edema, shortness of breath, hypoxia 88% on room air Dialysis She reported increasing shortness of breath 2 weeks prior to admission Reports having treatments Monday Wednesday Friday Was discharged home on nasal cannula oxygen Hemoglobin 10.2  Last echocardiogram July 2023 EF 60 to 65%  Stress test May 2023 Low risk study, no significant coronary calcification  Today not on oxygen Previous dry weight 97.1  dialysis on Monday Wednesday Friday Yesterday weight after dialysis 92.7 kilograms Weight down over 20 to 30 pounds over the past several years, 12 to 13 pounds in the past year Eating less  Recently started on hydralazine 25 mg 3 times daily Blood pressure borderline low on today's visit  Reports taking torsemide 20 mg daily On dialysis days does not take any medications until after dialysis then takes all of her medications  Echo July 2023 EF 60 to 65%, mild LVH    Past Medical History:  Diagnosis Date    Allergy    Anemia    Arthritis    "right knee" (10/26'/2017)   Chronic heart failure with preserved ejection fraction (HFpEF) (HCC)    a. 04/2021 Echo: EF >55%, GrI DD, nl RV fxn, triv MR; b. 12/2021 Echo: EF 60-65%, no rwma.   Demand ischemia    a. 04/2021 HsTrop up to 73 in setting of volume overload; b. 10/2021 MV: EF 66% with small, mild reversible defect in the apical septal and mid anteroseptal myocardium which was felt to most likely represent artifact.   Depression    ESRD (end stage renal disease) (HCC)    a. OnHD (Dr. Lamont Dowdy)   History of blood transfusion 2008   "when I had brain tumor surgery"   History of MRSA infection 2008   History of stress test    a. 07/2018 MV: EF 66%, no ischemia/infarct.   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 12/2021 Echo: EF 60-65%, no rwma, mild LVH, nl RV fxn, small pericardial effusion - small pocket of post wall, ~ 1cm.   Precordial chest pain    a. 10/2021 MV: low risk.   Type II diabetes mellitus (HCC) 2011   on metformin until yr ago- no med now - off due to kidneys   Past Surgical History:  Procedure Laterality Date   A/V FISTULAGRAM Left 03/10/2019   Procedure: A/V FISTULAGRAM;  Surgeon: Renford Dills, MD;  Location: [facility] INVASIVE  CV LAB;  Service: Cardiovascular;  Laterality: Left;   A/V FISTULAGRAM Left 07/14/2019   Procedure: A/V FISTULAGRAM;  Surgeon: Renford Dills, MD;  Location: [facility] INVASIVE CV LAB;  Service: Cardiovascular;  Laterality: Left;   A/V FISTULAGRAM Left 01/07/2020   Procedure: A/V FISTULAGRAM;  Surgeon: Renford Dills, MD;  Location: [facility] INVASIVE CV LAB;  Service: Cardiovascular;  Laterality: Left;   A/V FISTULAGRAM Left 11/14/2021   Procedure: A/V Fistulagram;  Surgeon: Renford Dills, MD;  Location: [facility] INVASIVE CV LAB;  Service: Cardiovascular;  Laterality: Left;   AV FISTULA PLACEMENT Left 11/07/2018   Procedure: ARTERIOVENOUS (AV) FISTULA CREATION (  BRACHIO BASILIC ) VS BRACHIAL AXILLARY GRAFT;  Surgeon: Renford Dills, MD;  Location: [facility] ORS;  Service: Vascular;  Laterality: Left;   BRAIN MENINGIOMA EXCISION  08/2006   Foramina magnum meningioma   CERVICAL DISCECTOMY  1996   Dr. Beverlyn Roux   CESAREAN SECTION  1981   COLONOSCOPY     COLONOSCOPY WITH PROPOFOL N/A 03/07/2021   Procedure: COLONOSCOPY WITH PROPOFOL;  Surgeon: Pasty Spillers, MD;  Location: [facility] ENDOSCOPY;  Service: Endoscopy;  Laterality: N/A;   DIALYSIS/PERMA CATHETER INSERTION N/A 08/04/2018   Procedure: DIALYSIS/PERMA CATHETER INSERTION;  Surgeon: Annice Needy, MD;  Location: [facility] INVASIVE CV LAB;  Service: Cardiovascular;  Laterality: N/A;   DIALYSIS/PERMA CATHETER REMOVAL N/A 12/25/2018   Procedure: DIALYSIS/PERMA CATHETER REMOVAL;  Surgeon: Annice Needy, MD;  Location: [facility] INVASIVE CV LAB;  Service: Cardiovascular;  Laterality: N/A;   DILATION AND CURETTAGE OF UTERUS     JOINT REPLACEMENT     right knee left hip   Right wrist x-ray  2007   Deg. at 1st MCP   TOTAL HIP ARTHROPLASTY Left 03/27/2016   Procedure: LEFT TOTAL HIP ARTHROPLASTY ANTERIOR APPROACH;  Surgeon: Kathryne Hitch, MD;  Location: [facility] OR;  Service: Orthopedics;  Laterality: Left;   TOTAL KNEE ARTHROPLASTY Right 04/30/2017   Procedure: RIGHT TOTAL KNEE ARTHROPLASTY;  Surgeon: Kathryne Hitch, MD;  Location: [facility] OR;  Service: Orthopedics;  Laterality: Right;   TOTAL KNEE ARTHROPLASTY Left 03/13/2022   Procedure: LEFT TOTAL KNEE ARTHROPLASTY;  Surgeon: Kathryne Hitch, MD;  Location: [facility] OR;  Service: Orthopedics;  Laterality: Left;   TUBAL LIGATION  1981     Current Meds  Medication Sig   acetaminophen (TYLENOL) 500 MG tablet Take 1,000 mg by mouth every 6 (six) hours as needed for mild pain or moderate pain.   amLODipine (NORVASC) 10 MG tablet Take 10 mg by mouth daily.   amoxicillin (AMOXIL) 500 MG capsule Take 500 mg by mouth 3 (three) times daily.   atorvastatin  (LIPITOR) 40 MG tablet TAKE 40 MG BY MOUTH ONCE DAILY (Patient taking differently: Take 40 mg by mouth daily.)   hydrALAZINE (APRESOLINE) 25 MG tablet Take 1 tablet (25 mg total) by mouth 3 (three) times daily.   levETIRAcetam (KEPPRA) 1000 MG tablet TAKE 1 TABLET BY MOUTH TWICE A DAY   lidocaine-prilocaine (EMLA) cream Apply 1 application topically every Monday, Wednesday, and Friday with hemodialysis.   LORazepam (ATIVAN) 0.5 MG tablet TAKE 1 TABLET BY MOUTH TWICE A DAY AS NEEDED FOR ANXIETY (Patient taking differently: Take 0.5 mg by mouth 2 (two) times daily as needed for anxiety.)   losartan (COZAAR) 100 MG tablet Take 100 mg by mouth daily.   metoprolol succinate (TOPROL-XL) 50 MG 24 hr tablet Take 50 mg by mouth daily.   multivitamin (RENA-VIT) TABS tablet Take 1 tablet by  mouth at bedtime.   omeprazole (PRILOSEC) 20 MG capsule TAKE 1 CAPSULE BY MOUTH EVERY DAY   ondansetron (ZOFRAN-ODT) 4 MG disintegrating tablet Take 4 mg by mouth every 8 (eight) hours as needed for nausea or vomiting.   sevelamer carbonate (RENVELA) 800 MG tablet Take 800-1,600 mg by mouth See admin instructions. Take 2 tablets (1600 mg) by mouth three times daily with meals and take 1 tablet (800 mg) by mouth twice daily with snacks   torsemide (DEMADEX) 20 MG tablet Take 20 mg by mouth daily. Every day (including on dialysis days)     Allergies:   Naproxen sodium and Sulfamethoxazole-trimethoprim   Social History   Tobacco Use   Smoking status: Never    Passive exposure: Past   Smokeless tobacco: Never  Vaping Use   Vaping status: Never Used  Substance Use Topics   Alcohol use: No    Alcohol/week: 0.0 standard drinks of alcohol   Drug use: No    Family Hx: The patient's family history includes Breast cancer (age of onset: 61) in her mother; Heart disease in her sister; Heart failure in her sister; Hypertension in an other family member.  ROS:   Please see the history of present illness.    Review of  Systems  Constitutional: Negative.   HENT: Negative.    Respiratory: Negative.    Cardiovascular: Negative.   Gastrointestinal: Negative.   Musculoskeletal: Negative.   Neurological: Negative.   Psychiatric/Behavioral: Negative.    All other systems reviewed and are negative.    Labs/Other Tests and Data Reviewed:    Recent Labs: 12/30/2022: Magnesium 2.1 12/31/2022: BUN 33; Creatinine, Ser 7.51; Hemoglobin 10.2; Platelets 158; Potassium 3.8; Sodium 134   Recent Lipid Panel Lab Results  Component Value Date/Time   CHOL 178 12/20/2017 11:45 AM   TRIG 85.0 12/20/2017 11:45 AM   HDL 48.70 12/20/2017 11:45 AM   CHOLHDL 4 12/20/2017 11:45 AM   LDLCALC 113 (H) 12/20/2017 11:45 AM   LDLDIRECT 197.0 07/15/2009 10:35 AM    Wt Readings from Last 3 Encounters:  01/01/23 209 lb (94.8 kg)  12/31/22 204 lb 9.4 oz (92.8 kg)  12/21/22 213 lb (96.6 kg)     Exam:    Vital Signs: Vital signs may also be detailed in the HPI BP (!) 110/50 (BP Location: Right Arm, Patient Position: Sitting, Cuff Size: Large)   Pulse 68   Ht 5\' 2"  (1.575 m)   Wt 209 lb (94.8 kg)   SpO2 93%   BMI 38.23 kg/m   Constitutional:  oriented to person, place, and time. No distress.  HENT:  Head: Grossly normal Eyes:  no discharge. No scleral icterus.  Neck: No JVD, no carotid bruits  Cardiovascular: Regular rate and rhythm, no murmurs appreciated Pulmonary/Chest: Clear to auscultation bilaterally, no wheezes or rails Abdominal: Soft.  no distension.  no tenderness.  Musculoskeletal: Normal range of motion Neurological:  normal muscle tone. Coordination normal. No atrophy Skin: Skin warm and dry Psychiatric: normal affect, pleasant   ASSESSMENT & PLAN:    Chronic diastolic CHF (congestive heart failure) (HCC) Fluid status managed by hemodialysis Also takes torsemide 20 mg daily Recommend she moderate her fluid intake, discussed with nephrology whether she should take higher dose torsemide on nondialysis  days  Chronic kidney disease (CKD), stage V (HCC) On hemodialysis Monday Wednesday Friday  Morbid obesity (HCC) Hide has been trending down over the past several years, previously 238 on last clinic visit with myself now running 209  Essential hypertension Recent extra hemodialysis in the hospital Blood pressure running low, recommend she closely monitor at home If blood pressure continues to run low, we may need to hold hydralazine  Mixed hyperlipidemia On lipitor  Controlled type 2 diabetes mellitus with diabetic nephropathy, without long-term current use of insulin (HCC) A1c 5.4, down after recent weight loss  Dyspnea on exertion  Sx resolved on HD   Total encounter time more than 30 minutes  Greater than 50% was spent in counseling and coordination of care with the patient    Signed, Julien Nordmann, MD  01/01/2023 4:46 PM    [facility] [address], [place], [place] [address]

## 2023-01-01 NOTE — Patient Instructions (Signed)
Medication Instructions:  No changes  Monitor blood pressure For low pressure, we may need to hold hydralazine  If you need a refill on your cardiac medications before your next appointment, please call your pharmacy.   Lab work: No new labs needed  Testing/Procedures: No new testing needed  Follow-Up: At [facility], you and your health needs are our priority.  As part of our continuing mission to provide you with exceptional heart care, we have created designated Provider Care Teams.  These Care Teams include your primary Cardiologist (physician) and Advanced Practice Providers (APPs -  Physician Assistants and Nurse Practitioners) who all work together to provide you with the care you need, when you need it.  You will need a follow up appointment in 6 months  Providers on your designated Care Team:   Nicolasa Ducking, NP Eula Listen, PA-C Cadence Fransico Michael, [place]  COVID-19 Vaccine Information can be found at: [email or web address] For questions related to vaccine distribution or appointments, please email [email or web address] or call [phone].

## 2023-01-02 ENCOUNTER — Other Ambulatory Visit: Payer: Self-pay | Admitting: Internal Medicine

## 2023-01-02 DIAGNOSIS — N186 End stage renal disease: Secondary | ICD-10-CM | POA: Diagnosis not present

## 2023-01-02 DIAGNOSIS — N2581 Secondary hyperparathyroidism of renal origin: Secondary | ICD-10-CM | POA: Diagnosis not present

## 2023-01-02 DIAGNOSIS — Z992 Dependence on renal dialysis: Secondary | ICD-10-CM | POA: Diagnosis not present

## 2023-01-04 ENCOUNTER — Ambulatory Visit: Payer: Self-pay

## 2023-01-04 DIAGNOSIS — N2581 Secondary hyperparathyroidism of renal origin: Secondary | ICD-10-CM | POA: Diagnosis not present

## 2023-01-04 DIAGNOSIS — N186 End stage renal disease: Secondary | ICD-10-CM | POA: Diagnosis not present

## 2023-01-04 DIAGNOSIS — Z992 Dependence on renal dialysis: Secondary | ICD-10-CM | POA: Diagnosis not present

## 2023-01-04 NOTE — Patient Outreach (Signed)
  Care Coordination   Follow Up Visit Note   01/04/2023 Name: Carrie [name] MRN: [id number] DOB: [birth year]  Carrie Kitchen [name] is a 72 y.o. year old female who sees Karie Schwalbe, MD for primary care. I spoke with  Carrie Kitchen [name] by phone today.  What matters to the patients health and wellness today?    Per chart review patient recently admitted to hospital on 12/29/22 for Pulmonary Edema, acute respiratory failure.  Patient states she is doing better.  She reports having follow up with cardiologist on 01/01/23.  Patient states she has O2 at home and wears at 2L prn. She states cardiologist adjusted her hydralazine frequency to 2 x per day.  Patient denies any increase in SOB with activity or swelling in LE.  Patient reports most recent home monitored BP's have been 157/75 and 165/80.  Patient reports BP reading at cardiology visit on 01/01/23 was 110/50.   Patient states she continues to adhere to a daily fluid intake of 32 oz.  She reports being compliant with dialysis. Per chart review patient has physical scheduled with PCP on 02/26/23 and she states she has a follow up in 6 months with cardiologist.    Goals Addressed             This Visit's Progress    Management of chronic health conditions.       Interventions Today    Flowsheet Row Most Recent Value  Chronic Disease   Chronic disease during today's visit Congestive Heart Failure (CHF), Hypertension (HTN)  General Interventions   General Interventions Discussed/Reviewed General Interventions Reviewed, Doctor Visits  [evaluation of current treatment for HF, HTN and patients adherence to plan as established by provider.  Assessed for HF symptoms, BP readings, O2 sat readings.]  Doctor Visits Discussed/Reviewed Doctor Visits Reviewed  Carrie [name] upcoming provider visits.  Advised to continue to follow up with provider as recommended.]  Education Interventions   Education Provided Provided Education  [Reviewed HF symptoms.  Advised  to continue monitoring HF symptoms and BP readings and record.  Advised to notifiy provider for increase in symptoms or BP readings outside of established parameters. Advised to monitor O 2 sat regularly and wear O2 prn.]  Pharmacy Interventions   Pharmacy Dicussed/Reviewed Pharmacy Topics Reviewed  [medications reviewed.  Discussed medications adjustment and updated in EPIC. Discussed importance of compliance]              SDOH assessments and interventions completed:  No     Care Coordination Interventions:  Yes, provided   Follow up plan: Follow up call scheduled for 02/12/23    Encounter Outcome:  Pt. Visit Completed   George Ina RN,BSN,CCM [facility] Care Coordination [phone] direct line

## 2023-01-04 NOTE — Patient Instructions (Signed)
Visit Information  Thank you for taking time to visit with me today. Please don't hesitate to contact me if I can be of assistance to you.   Following are the goals we discussed today:   Goals Addressed             This Visit's Progress    Management of chronic health conditions.       Interventions Today    Flowsheet Row Most Recent Value  Chronic Disease   Chronic disease during today's visit Congestive Heart Failure (CHF), Hypertension (HTN)  General Interventions   General Interventions Discussed/Reviewed General Interventions Reviewed, Doctor Visits  [evaluation of current treatment for HF, HTN and patients adherence to plan as established by provider.  Assessed for HF symptoms, BP readings, O2 sat readings.]  Doctor Visits Discussed/Reviewed Doctor Visits Reviewed  [name] upcoming provider visits.  Advised to continue to follow up with provider as recommended.]  Education Interventions   Education Provided Provided Education  [Reviewed HF symptoms.  Advised to continue monitoring HF symptoms and BP readings and record.  Advised to notifiy provider for increase in symptoms or BP readings outside of established parameters. Advised to monitor O 2 sat regularly and wear O2 prn.]  Pharmacy Interventions   Pharmacy Dicussed/Reviewed Pharmacy Topics Reviewed  [medications reviewed.  Discussed medications adjustment and updated in EPIC. Discussed importance of compliance]              Our next appointment is by telephone on 02/12/23 at 10 am  Please call the care guide team at [phone] if you need to cancel or reschedule your appointment.   If you are experiencing a Mental Health or Behavioral Health Crisis or need someone to talk to, please call the Suicide and Crisis Lifeline: 988 call 1-800-273-TALK (toll free, 24 hour hotline)  Patient verbalizes understanding of instructions and care plan provided today and agrees to view in MyChart. Active MyChart status and patient  understanding of how to access instructions and care plan via MyChart confirmed with patient.     George Ina RN,BSN,CCM [facility] Care Coordination [phone] direct line

## 2023-01-07 DIAGNOSIS — N186 End stage renal disease: Secondary | ICD-10-CM | POA: Diagnosis not present

## 2023-01-07 DIAGNOSIS — N2581 Secondary hyperparathyroidism of renal origin: Secondary | ICD-10-CM | POA: Diagnosis not present

## 2023-01-07 DIAGNOSIS — Z992 Dependence on renal dialysis: Secondary | ICD-10-CM | POA: Diagnosis not present

## 2023-01-07 NOTE — Patient Outreach (Deleted)
  Care Coordination   {Initialfollowuphomecarecoordination:27762} Visit Note   01/07/2023 Name: Carrie [name] MRN: [id number] DOB: [birth year]  [name] Carrie [name] is a 72 y.o. year old female who sees Karie Schwalbe, MD for primary care. I {TYPECCVISIT:27766}  What matters to the patients health and wellness today?  ***    Goals Addressed   None     SDOH assessments and interventions completed:  {yes/no:20286}{THN Tip this will not be part of the note when signed-REQUIRED REPORT FIELD DO NOT DELETE (Optional):27901}     Care Coordination Interventions:  {INTERVENTIONS:27767} {THN Tip this will not be part of the note when signed-REQUIRED REPORT FIELD DO NOT DELETE (Optional):27901}  Follow up plan: {CCFOLLOWUP:27768}   Encounter Outcome:  {ENCOUTCOME:27770} {THN Tip this will not be part of the note when signed-REQUIRED REPORT FIELD DO NOT DELETE (Optional):27901}

## 2023-01-07 NOTE — Telephone Encounter (Signed)
This encounter was created in error - please disregard.

## 2023-01-09 DIAGNOSIS — N186 End stage renal disease: Secondary | ICD-10-CM | POA: Diagnosis not present

## 2023-01-09 DIAGNOSIS — N2581 Secondary hyperparathyroidism of renal origin: Secondary | ICD-10-CM | POA: Diagnosis not present

## 2023-01-09 DIAGNOSIS — Z992 Dependence on renal dialysis: Secondary | ICD-10-CM | POA: Diagnosis not present

## 2023-01-11 DIAGNOSIS — N186 End stage renal disease: Secondary | ICD-10-CM | POA: Diagnosis not present

## 2023-01-11 DIAGNOSIS — N2581 Secondary hyperparathyroidism of renal origin: Secondary | ICD-10-CM | POA: Diagnosis not present

## 2023-01-11 DIAGNOSIS — Z992 Dependence on renal dialysis: Secondary | ICD-10-CM | POA: Diagnosis not present

## 2023-01-14 DIAGNOSIS — N2581 Secondary hyperparathyroidism of renal origin: Secondary | ICD-10-CM | POA: Diagnosis not present

## 2023-01-14 DIAGNOSIS — Z992 Dependence on renal dialysis: Secondary | ICD-10-CM | POA: Diagnosis not present

## 2023-01-14 DIAGNOSIS — N186 End stage renal disease: Secondary | ICD-10-CM | POA: Diagnosis not present

## 2023-01-16 DIAGNOSIS — N186 End stage renal disease: Secondary | ICD-10-CM | POA: Diagnosis not present

## 2023-01-16 DIAGNOSIS — Z992 Dependence on renal dialysis: Secondary | ICD-10-CM | POA: Diagnosis not present

## 2023-01-16 DIAGNOSIS — N2581 Secondary hyperparathyroidism of renal origin: Secondary | ICD-10-CM | POA: Diagnosis not present

## 2023-01-17 ENCOUNTER — Encounter (INDEPENDENT_AMBULATORY_CARE_PROVIDER_SITE_OTHER): Payer: Self-pay

## 2023-01-18 DIAGNOSIS — N2581 Secondary hyperparathyroidism of renal origin: Secondary | ICD-10-CM | POA: Diagnosis not present

## 2023-01-18 DIAGNOSIS — Z992 Dependence on renal dialysis: Secondary | ICD-10-CM | POA: Diagnosis not present

## 2023-01-18 DIAGNOSIS — N186 End stage renal disease: Secondary | ICD-10-CM | POA: Diagnosis not present

## 2023-01-21 DIAGNOSIS — N2581 Secondary hyperparathyroidism of renal origin: Secondary | ICD-10-CM | POA: Diagnosis not present

## 2023-01-21 DIAGNOSIS — N186 End stage renal disease: Secondary | ICD-10-CM | POA: Diagnosis not present

## 2023-01-21 DIAGNOSIS — Z992 Dependence on renal dialysis: Secondary | ICD-10-CM | POA: Diagnosis not present

## 2023-01-25 DIAGNOSIS — Z992 Dependence on renal dialysis: Secondary | ICD-10-CM | POA: Diagnosis not present

## 2023-01-25 DIAGNOSIS — N2581 Secondary hyperparathyroidism of renal origin: Secondary | ICD-10-CM | POA: Diagnosis not present

## 2023-01-25 DIAGNOSIS — N186 End stage renal disease: Secondary | ICD-10-CM | POA: Diagnosis not present

## 2023-01-28 DIAGNOSIS — N186 End stage renal disease: Secondary | ICD-10-CM | POA: Diagnosis not present

## 2023-01-28 DIAGNOSIS — Z992 Dependence on renal dialysis: Secondary | ICD-10-CM | POA: Diagnosis not present

## 2023-01-28 DIAGNOSIS — N2581 Secondary hyperparathyroidism of renal origin: Secondary | ICD-10-CM | POA: Diagnosis not present

## 2023-01-30 DIAGNOSIS — Z992 Dependence on renal dialysis: Secondary | ICD-10-CM | POA: Diagnosis not present

## 2023-01-30 DIAGNOSIS — N2581 Secondary hyperparathyroidism of renal origin: Secondary | ICD-10-CM | POA: Diagnosis not present

## 2023-01-30 DIAGNOSIS — N186 End stage renal disease: Secondary | ICD-10-CM | POA: Diagnosis not present

## 2023-01-30 HISTORY — PX: MULTIPLE TOOTH EXTRACTIONS: SHX2053

## 2023-01-31 ENCOUNTER — Ambulatory Visit: Payer: Medicare HMO | Admitting: Nurse Practitioner

## 2023-02-01 DIAGNOSIS — N186 End stage renal disease: Secondary | ICD-10-CM | POA: Diagnosis not present

## 2023-02-01 DIAGNOSIS — Z992 Dependence on renal dialysis: Secondary | ICD-10-CM | POA: Diagnosis not present

## 2023-02-01 DIAGNOSIS — N2581 Secondary hyperparathyroidism of renal origin: Secondary | ICD-10-CM | POA: Diagnosis not present

## 2023-02-02 DIAGNOSIS — N186 End stage renal disease: Secondary | ICD-10-CM | POA: Diagnosis not present

## 2023-02-02 DIAGNOSIS — Z992 Dependence on renal dialysis: Secondary | ICD-10-CM | POA: Diagnosis not present

## 2023-02-04 DIAGNOSIS — N186 End stage renal disease: Secondary | ICD-10-CM | POA: Diagnosis not present

## 2023-02-04 DIAGNOSIS — Z992 Dependence on renal dialysis: Secondary | ICD-10-CM | POA: Diagnosis not present

## 2023-02-04 DIAGNOSIS — N2581 Secondary hyperparathyroidism of renal origin: Secondary | ICD-10-CM | POA: Diagnosis not present

## 2023-02-06 DIAGNOSIS — Z992 Dependence on renal dialysis: Secondary | ICD-10-CM | POA: Diagnosis not present

## 2023-02-06 DIAGNOSIS — N2581 Secondary hyperparathyroidism of renal origin: Secondary | ICD-10-CM | POA: Diagnosis not present

## 2023-02-06 DIAGNOSIS — N186 End stage renal disease: Secondary | ICD-10-CM | POA: Diagnosis not present

## 2023-02-08 DIAGNOSIS — Z992 Dependence on renal dialysis: Secondary | ICD-10-CM | POA: Diagnosis not present

## 2023-02-08 DIAGNOSIS — N186 End stage renal disease: Secondary | ICD-10-CM | POA: Diagnosis not present

## 2023-02-08 DIAGNOSIS — N2581 Secondary hyperparathyroidism of renal origin: Secondary | ICD-10-CM | POA: Diagnosis not present

## 2023-02-11 DIAGNOSIS — Z992 Dependence on renal dialysis: Secondary | ICD-10-CM | POA: Diagnosis not present

## 2023-02-11 DIAGNOSIS — N186 End stage renal disease: Secondary | ICD-10-CM | POA: Diagnosis not present

## 2023-02-11 DIAGNOSIS — N2581 Secondary hyperparathyroidism of renal origin: Secondary | ICD-10-CM | POA: Diagnosis not present

## 2023-02-12 ENCOUNTER — Ambulatory Visit: Payer: Self-pay

## 2023-02-12 NOTE — Patient Outreach (Signed)
  Care Coordination   Follow Up Visit Note   02/12/2023 Name: Carrie [name] MRN: [id number] DOB: [birth year]  Carrie Kitchen [name] is a 72 y.o. year old female who sees Karie Schwalbe, MD for primary care. I spoke with  Carrie Kitchen [name] by phone today.  What matters to the patients health and wellness today?  Patient states she is doing well. She reports blood pressures are ranging from 130's/70's to 185/70's.  She states blood pressure is still fluctuating.  She denies any LE swelling or increase in SOB. Patient states she has only used her oxygen once since being out of the hospital the end of July 2024.  Patient reports her weight today is 203 lbs. Patient reports having 2 teeth extracted last month. She states she has healed well from the extractions and didn't have any complications.     Goals Addressed             This Visit's Progress    Management of chronic health conditions.       Interventions Today    Flowsheet Row Most Recent Value  Chronic Disease   Chronic disease during today's visit Congestive Heart Failure (CHF), Hypertension (HTN), Chronic Kidney Disease/End Stage Renal Disease (ESRD)  General Interventions   General Interventions Discussed/Reviewed General Interventions Reviewed, Doctor Visits  [evaluation of current treatment plan for HF, HTN, CKD and patients adherence to plan as established by provider.  Assessed for HF symptoms.]  Doctor Visits Discussed/Reviewed Doctor Visits Reviewed  [name] upcoming provider visits. Advised to keep follow up appointments with providers.  Reminded patient of upcoming physical appointment scheduled for 02/26/23]  Education Interventions   Education Provided Provided Education  [Advised to continue monitoring for heart failure symptoms. Advised to notify provider for mild to moderate symptoms and call 911 for severe symptoms. Advised to continue use of oxygen as needed and recommended by provider.]  Provided Verbal Education On  Other  [Advised to continue to adhere to fluid restriction and monitor weight daily and record.]  Pharmacy Interventions   Pharmacy Dicussed/Reviewed Pharmacy Topics Reviewed  [medications reviewed and compliance discussed. Assessed BP readings on hydralazine medication adjustment from cardiologist.]              SDOH assessments and interventions completed:  No     Care Coordination Interventions:  Yes, provided   Follow up plan: Follow up call scheduled for 03/21/23    Encounter Outcome:  Patient Visit Completed   George Ina RN,BSN,CCM [facility] Care Coordination [phone] direct line

## 2023-02-12 NOTE — Patient Instructions (Signed)
Visit Information  Thank you for taking time to visit with me today. Please don't hesitate to contact me if I can be of assistance to you.   Following are the goals we discussed today:   Goals Addressed             This Visit's Progress    Management of chronic health conditions.       Interventions Today    Flowsheet Row Most Recent Value  Chronic Disease   Chronic disease during today's visit Congestive Heart Failure (CHF), Hypertension (HTN), Chronic Kidney Disease/End Stage Renal Disease (ESRD)  General Interventions   General Interventions Discussed/Reviewed General Interventions Reviewed, Doctor Visits  [evaluation of current treatment plan for HF, HTN, CKD and patients adherence to plan as established by provider.  Assessed for HF symptoms.]  Doctor Visits Discussed/Reviewed Doctor Visits Reviewed  [name] upcoming provider visits. Advised to keep follow up appointments with providers.  Reminded patient of upcoming physical appointment scheduled for 02/26/23]  Education Interventions   Education Provided Provided Education  [Advised to continue monitoring for heart failure symptoms. Advised to notify provider for mild to moderate symptoms and call 911 for severe symptoms. Advised to continue use of oxygen as needed and recommended by provider.]  Provided Verbal Education On Other  [Advised to continue to adhere to fluid restriction and monitor weight daily and record.]  Pharmacy Interventions   Pharmacy Dicussed/Reviewed Pharmacy Topics Reviewed  [medications reviewed and compliance discussed. Assessed BP readings on hydralazine medication adjustment from cardiologist.]              Our next appointment is by telephone on 03/21/23 at 10 am  Please call the care guide team at [phone] if you need to cancel or reschedule your appointment.   If you are experiencing a Mental Health or Behavioral Health Crisis or need someone to talk to, please call the Suicide and  Crisis Lifeline: 988 call 1-800-273-TALK (toll free, 24 hour hotline)  Patient verbalizes understanding of instructions and care plan provided today and agrees to view in MyChart. Active MyChart status and patient understanding of how to access instructions and care plan via MyChart confirmed with patient.     George Ina RN,BSN,CCM [facility] Care Coordination [phone] direct line

## 2023-02-13 ENCOUNTER — Other Ambulatory Visit: Payer: Self-pay | Admitting: Internal Medicine

## 2023-02-13 DIAGNOSIS — N2581 Secondary hyperparathyroidism of renal origin: Secondary | ICD-10-CM | POA: Diagnosis not present

## 2023-02-13 DIAGNOSIS — N186 End stage renal disease: Secondary | ICD-10-CM | POA: Diagnosis not present

## 2023-02-13 DIAGNOSIS — Z992 Dependence on renal dialysis: Secondary | ICD-10-CM | POA: Diagnosis not present

## 2023-02-15 DIAGNOSIS — N2581 Secondary hyperparathyroidism of renal origin: Secondary | ICD-10-CM | POA: Diagnosis not present

## 2023-02-15 DIAGNOSIS — Z992 Dependence on renal dialysis: Secondary | ICD-10-CM | POA: Diagnosis not present

## 2023-02-15 DIAGNOSIS — N186 End stage renal disease: Secondary | ICD-10-CM | POA: Diagnosis not present

## 2023-02-18 DIAGNOSIS — N2581 Secondary hyperparathyroidism of renal origin: Secondary | ICD-10-CM | POA: Diagnosis not present

## 2023-02-18 DIAGNOSIS — N186 End stage renal disease: Secondary | ICD-10-CM | POA: Diagnosis not present

## 2023-02-18 DIAGNOSIS — Z992 Dependence on renal dialysis: Secondary | ICD-10-CM | POA: Diagnosis not present

## 2023-02-19 DIAGNOSIS — N2581 Secondary hyperparathyroidism of renal origin: Secondary | ICD-10-CM | POA: Diagnosis not present

## 2023-02-19 DIAGNOSIS — N186 End stage renal disease: Secondary | ICD-10-CM | POA: Diagnosis not present

## 2023-02-19 DIAGNOSIS — Z992 Dependence on renal dialysis: Secondary | ICD-10-CM | POA: Diagnosis not present

## 2023-02-20 DIAGNOSIS — N186 End stage renal disease: Secondary | ICD-10-CM | POA: Diagnosis not present

## 2023-02-20 DIAGNOSIS — N2581 Secondary hyperparathyroidism of renal origin: Secondary | ICD-10-CM | POA: Diagnosis not present

## 2023-02-20 DIAGNOSIS — Z992 Dependence on renal dialysis: Secondary | ICD-10-CM | POA: Diagnosis not present

## 2023-02-22 DIAGNOSIS — N186 End stage renal disease: Secondary | ICD-10-CM | POA: Diagnosis not present

## 2023-02-22 DIAGNOSIS — N2581 Secondary hyperparathyroidism of renal origin: Secondary | ICD-10-CM | POA: Diagnosis not present

## 2023-02-22 DIAGNOSIS — Z992 Dependence on renal dialysis: Secondary | ICD-10-CM | POA: Diagnosis not present

## 2023-02-25 DIAGNOSIS — N2581 Secondary hyperparathyroidism of renal origin: Secondary | ICD-10-CM | POA: Diagnosis not present

## 2023-02-25 DIAGNOSIS — N186 End stage renal disease: Secondary | ICD-10-CM | POA: Diagnosis not present

## 2023-02-25 DIAGNOSIS — Z992 Dependence on renal dialysis: Secondary | ICD-10-CM | POA: Diagnosis not present

## 2023-02-26 ENCOUNTER — Ambulatory Visit: Payer: Medicare HMO | Admitting: Internal Medicine

## 2023-02-26 ENCOUNTER — Encounter: Payer: Self-pay | Admitting: Internal Medicine

## 2023-02-26 VITALS — BP 134/88 | HR 66 | Temp 97.7°F | Ht 62.5 in | Wt 212.0 lb

## 2023-02-26 DIAGNOSIS — Z992 Dependence on renal dialysis: Secondary | ICD-10-CM

## 2023-02-26 DIAGNOSIS — Z23 Encounter for immunization: Secondary | ICD-10-CM | POA: Diagnosis not present

## 2023-02-26 DIAGNOSIS — N186 End stage renal disease: Secondary | ICD-10-CM

## 2023-02-26 DIAGNOSIS — Z0000 Encounter for general adult medical examination without abnormal findings: Secondary | ICD-10-CM | POA: Diagnosis not present

## 2023-02-26 DIAGNOSIS — G40909 Epilepsy, unspecified, not intractable, without status epilepticus: Secondary | ICD-10-CM | POA: Diagnosis not present

## 2023-02-26 DIAGNOSIS — I5032 Chronic diastolic (congestive) heart failure: Secondary | ICD-10-CM | POA: Diagnosis not present

## 2023-02-26 DIAGNOSIS — F39 Unspecified mood [affective] disorder: Secondary | ICD-10-CM

## 2023-02-26 MED ORDER — LEVETIRACETAM 500 MG PO TABS: 500.0000 mg | ORAL_TABLET | Freq: Two times a day (BID) | ORAL | 3 refills | Status: DC

## 2023-02-26 NOTE — Assessment & Plan Note (Signed)
Some situational stress--but no regular depression Does okay with prn lorazepam

## 2023-02-26 NOTE — Assessment & Plan Note (Signed)
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

## 2023-02-26 NOTE — Assessment & Plan Note (Signed)
Fluid controlled with dialysis and torsemide 20mg  daily Losartan 100, metoprolo 50 daiy---hydralazine 25 tid also

## 2023-02-26 NOTE — Progress Notes (Signed)
Subjective:    Patient ID: Carrie [name], female    DOB: [birth year], 72 y.o.   MRN: [id number]  HPI Here with sister for Medicare wellness visit and follow up of chronic health conditions Reviewed advanced directives Reviewed other doctors---[facility], Dr Gollan--cardiology, Dr Loraine Leriche, Dr Lanny Cramp, Dr Wellington [place] [facility], [facility], [facility]--then saw dental surgery for extractions Hospitalized for CHF about a month ago---got immediate dialysis and improved No surgery this year--other than 2 teeth extractions No alcohol or tobacco Not really exercising No falls No depression or anhedonia. 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     albuterol (VENTOLIN HFA) 108 (90 Base) MCG/ACT inhaler Inhale 2 puffs into the lungs every 6 (six) hours as needed for wheezing or shortness of breath. 8 g 2   amLODipine (NORVASC) 10 MG tablet Take 10 mg by mouth daily.     atorvastatin (LIPITOR) 40 MG tablet TAKE 40 MG BY MOUTH ONCE DAILY (Patient taking differently: Take 40 mg by mouth daily.) 90 tablet 3   hydrALAZINE (APRESOLINE) 25 MG tablet Take 1 tablet (25 mg total) by mouth 3 (three) times daily. 90 tablet 5   levETIRAcetam (KEPPRA) 1000 MG tablet TAKE 1 TABLET BY MOUTH TWICE A DAY 180 tablet 3   lidocaine-prilocaine (EMLA) cream Apply 1 application topically every  Monday, Wednesday, and Friday with hemodialysis.     LORazepam (ATIVAN) 0.5 MG tablet TAKE 1 TABLET BY MOUTH 2 TIMES DAILY AS NEEDED FOR ANXIETY. 60 tablet 0   losartan (COZAAR) 100 MG tablet Take 100 mg by mouth daily.     metoprolol succinate (TOPROL-XL) 50 MG 24 hr tablet Take 50 mg by mouth daily.     multivitamin (RENA-VIT) TABS tablet Take 1 tablet by mouth at bedtime. 30 tablet 0   omeprazole (PRILOSEC) 20 MG capsule TAKE 1 CAPSULE BY MOUTH EVERY DAY 90 capsule 3   ondansetron (ZOFRAN-ODT) 4 MG disintegrating tablet Take 4 mg by mouth every 8 (eight) hours as needed for nausea or vomiting.     sevelamer carbonate (RENVELA) 800 MG tablet Take 800-1,600 mg by mouth See admin instructions. Take 2 tablets (1600 mg) by mouth three times daily with meals and take 1 tablet (800 mg) by mouth twice daily with snacks     torsemide (DEMADEX) 20 MG tablet Take 20 mg by mouth daily. Every day (including on dialysis days)     No current facility-administered medications on file prior to visit.    Allergies  Allergen Reactions   Naproxen Sodium Anaphylaxis and Swelling   Sulfamethoxazole-Trimethoprim Anaphylaxis and Swelling    Past Medical History:  Diagnosis Date   Allergy    Anemia    Arthritis    "right knee" (10/26'/2017)   Chronic heart failure with preserved ejection fraction (HFpEF) (HCC)    a. 04/2021 Echo: EF >55%, GrI DD, nl RV fxn, triv MR; b. 12/2021 Echo: EF 60-65%, no rwma.  Demand ischemia    a. 04/2021 HsTrop up to 73 in setting of volume overload; b. 10/2021 MV: EF 66% with small, mild reversible defect in the apical septal and mid anteroseptal myocardium which was felt to most likely represent artifact.   Depression    ESRD (end stage renal disease) (HCC)    a. [facility] (Dr. Lamont Dowdy)   History of blood transfusion 2008   "when I had brain tumor surgery"   History of MRSA infection 2008   History of stress test    a. 07/2018 MV: EF 66%, no ischemia/infarct.    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 12/2021 Echo: EF 60-65%, no rwma, mild LVH, nl RV fxn, small pericardial effusion - small pocket of post wall, ~ 1cm.   Precordial chest pain    a. 10/2021 MV: low risk.   Type II diabetes mellitus (HCC) 2011   on metformin until yr ago- no med now - off due to kidneys    Past Surgical History:  Procedure Laterality Date   A/V FISTULAGRAM Left 03/10/2019   Procedure: A/V FISTULAGRAM;  Surgeon: Renford Dills, MD;  Location: [facility];  Service: Cardiovascular;  Laterality: Left;   A/V FISTULAGRAM Left 07/14/2019   Procedure: A/V FISTULAGRAM;  Surgeon: Renford Dills, MD;  Location: [facility];  Service: Cardiovascular;  Laterality: Left;   A/V FISTULAGRAM Left 01/07/2020   Procedure: A/V FISTULAGRAM;  Surgeon: Renford Dills, MD;  Location: [facility];  Service: Cardiovascular;  Laterality: Left;   A/V FISTULAGRAM Left 11/14/2021   Procedure: A/V Fistulagram;  Surgeon: Renford Dills, MD;  Location: [facility];  Service: Cardiovascular;  Laterality: Left;   AV FISTULA PLACEMENT Left 11/07/2018   Procedure: ARTERIOVENOUS (AV) FISTULA CREATION ( BRACHIO BASILIC ) VS BRACHIAL AXILLARY GRAFT;  Surgeon: Renford Dills, MD;  Location: [facility] ORS;  Service: Vascular;  Laterality: Left;   BRAIN MENINGIOMA EXCISION  08/2006   Foramina magnum meningioma   CERVICAL DISCECTOMY  1996   Dr. Beverlyn Roux   CESAREAN SECTION  1981   COLONOSCOPY     COLONOSCOPY WITH PROPOFOL N/A 03/07/2021   Procedure: COLONOSCOPY WITH PROPOFOL;  Surgeon: Pasty Spillers, MD;  Location: [facility];  Service: Endoscopy;  Laterality: N/A;   DIALYSIS/PERMA CATHETER INSERTION N/A 08/04/2018   Procedure: DIALYSIS/PERMA CATHETER INSERTION;  Surgeon: Annice Needy, MD;  Location: [facility];  Service: Cardiovascular;  Laterality: N/A;   DIALYSIS/PERMA CATHETER REMOVAL N/A  12/25/2018   Procedure: DIALYSIS/PERMA CATHETER REMOVAL;  Surgeon: Annice Needy, MD;  Location: [facility];  Service: Cardiovascular;  Laterality: N/A;   DILATION AND CURETTAGE OF UTERUS     JOINT REPLACEMENT     right knee left hip   MULTIPLE TOOTH EXTRACTIONS  01/30/2023   Right wrist x-ray  2007   Deg. at 1st MCP   TOTAL HIP ARTHROPLASTY Left 03/27/2016   Procedure: LEFT TOTAL HIP ARTHROPLASTY ANTERIOR APPROACH;  Surgeon: Kathryne Hitch, MD;  Location[facility];  Service: Orthopedics;  Laterality: Left;   TOTAL KNEE ARTHROPLASTY Right 04/30/2017   Procedure: RIGHT TOTAL KNEE ARTHROPLASTY;  Surgeon: Kathryne Hitch, MD;  Location: [facility] OR;  Service: Orthopedics;  Laterality: Right;   TOTAL KNEE ARTHROPLASTY Left 03/13/2022   Procedure: LEFT TOTAL KNEE ARTHROPLASTY;  Surgeon: Kathryne Hitch, MD;  Location: [facility] OR;  Service: Orthopedics;  Laterality: Left;   TUBAL LIGATION  1981    Family History  Problem Relation Age of Onset   Hypertension Other        Strong family history    Breast cancer Mother 45   Heart failure Sister    Heart disease Sister     Social History   Socioeconomic History   Marital status: Widowed    Spouse name: Not on file   Number of children: 1   Years of education: Not on file   Highest education level: 12th grade  Occupational History   Occupation: Formerly Chief Operating Officer, 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      No living will   Requests niece, Radford Pax, as health care POA   Would accept resuscitation attempts    No tube feeds if cognitively aware   Social Determinants of Health   Financial Resource Strain: Medium Risk (12/20/2022)   Overall Financial Resource Strain (CARDIA)    Difficulty of Paying Living Expenses: Somewhat hard  Food Insecurity: Food Insecurity Present (12/29/2022)   Hunger Vital Sign    Worried About Running Out of Food in the Last Year: Never true    Ran Out of Food in the Last Year: Sometimes true  Transportation Needs: No Transportation Needs (12/29/2022)   PRAPARE - Administrator, Civil Service (Medical): No    Lack of Transportation (Non-Medical): No  Physical Activity: Insufficiently Active (12/20/2022)   Exercise Vital Sign    Days of Exercise per Week: 2 days    Minutes of Exercise per Session: 10 min  Stress: No Stress Concern Present (12/20/2022)   [employer] of Occupational Health - Occupational Stress Questionnaire    Feeling of Stress : Only a little  Social Connections: Moderately Isolated (12/20/2022)   Social Connection and Isolation Panel [NHANES]    Frequency of Communication with Friends and Family: Three times a week    Frequency of Social Gatherings with Friends and Family: More than three times a week    Attends Religious Services: More than 4 times per year    Active Member of [employer] or Organizations: No    Attends Banker Meetings: Not on file    Marital Status: Widowed  Intimate Partner Violence: Not At Risk (03/13/2022)   Humiliation, Afraid, Rape, and Kick questionnaire    Fear of Current or Ex-Partner: No    Emotionally Abused: No    Physically Abused: No    Sexually Abused: No   Review of Systems Appetite is fair Doesn't sleep well--nothing new Wears seat belt Has needed some dental work No heartburn --occ problems swallowing. On omeprazole Bowels move fine Regular urination--no incontinence Episodic back pain---tylenol prn. No other sig joint issues No skin issues--no dermatologist    Objective:    Physical Exam Constitutional:      Appearance: Normal appearance.  HENT:     Mouth/Throat:     Pharynx: No oropharyngeal exudate or posterior oropharyngeal erythema.  Eyes:  Conjunctiva/sclera: Conjunctivae normal.     Pupils: Pupils are equal, round, and reactive to light.  Cardiovascular:     Rate and Rhythm: Normal rate and regular rhythm.     Pulses: Normal pulses.     Heart sounds:     No gallop.     Comments: Soft systolic murmur loudest at base Pulmonary:     Effort: Pulmonary effort is normal.     Breath sounds: Normal breath sounds. No wheezing or rales.  Abdominal:     Palpations: Abdomen is soft.     Tenderness: There is no abdominal tenderness.  Musculoskeletal:     Cervical back: Neck supple.     Right lower leg: No edema.     Left lower leg: No edema.  Lymphadenopathy:     Cervical: No cervical adenopathy.  Skin:    Findings: No rash.  Neurological:     General: No focal deficit present.     Mental Status: She is alert and oriented to person, place, and time.     Comments: Word naming---7/1 minute Recall 3/3  Psychiatric:        Mood and Affect: Mood normal.        Behavior: Behavior normal.            Assessment & Plan:

## 2023-02-26 NOTE — Assessment & Plan Note (Signed)
BMI 38 --fairly stable With ESRD, CHF, arthrits Discussed exercise

## 2023-02-26 NOTE — Progress Notes (Signed)
Vision Screening   Right eye Left eye Both eyes  Without correction     With correction 20/25 20/20 20/25   Hearing Screening - Comments:: Passed whisper test

## 2023-02-26 NOTE — Assessment & Plan Note (Signed)
Doing well with this

## 2023-02-26 NOTE — Assessment & Plan Note (Signed)
I have personally reviewed the Medicare Annual Wellness questionnaire and have noted 1. The patient's medical and social history 2. Their use of alcohol, tobacco or illicit drugs 3. Their current medications and supplements 4. The patient's functional ability including ADL's, fall risks, home safety risks and hearing or visual             impairment. 5. Diet and physical activities 6. Evidence for depression or mood disorders  The patients weight, height, BMI and visual acuity have been recorded in the chart I have made referrals, counseling and provided education to the patient based review of the above and I have provided the pt with a written personalized care plan for preventive services.  I have provided you with a copy of your personalized plan for preventive services. Please take the time to review along with your updated medication list.  Yearly mammogram till at least 73 Colon due next year No pap due to age Needs to do some exercise Flu vaccine today Prefers no COVID or shingrix Td and RSV at pharmacy--she is unsure

## 2023-02-27 DIAGNOSIS — N186 End stage renal disease: Secondary | ICD-10-CM | POA: Diagnosis not present

## 2023-02-27 DIAGNOSIS — N2581 Secondary hyperparathyroidism of renal origin: Secondary | ICD-10-CM | POA: Diagnosis not present

## 2023-02-27 DIAGNOSIS — Z992 Dependence on renal dialysis: Secondary | ICD-10-CM | POA: Diagnosis not present

## 2023-02-28 ENCOUNTER — Encounter: Payer: Self-pay | Admitting: Orthopaedic Surgery

## 2023-02-28 ENCOUNTER — Ambulatory Visit: Payer: Medicare HMO | Admitting: Orthopaedic Surgery

## 2023-02-28 ENCOUNTER — Other Ambulatory Visit (INDEPENDENT_AMBULATORY_CARE_PROVIDER_SITE_OTHER): Payer: Medicare HMO

## 2023-02-28 ENCOUNTER — Telehealth: Payer: Self-pay | Admitting: *Deleted

## 2023-02-28 DIAGNOSIS — Z96652 Presence of left artificial knee joint: Secondary | ICD-10-CM | POA: Diagnosis not present

## 2023-02-28 NOTE — Progress Notes (Signed)
The patient is now almost a year out from a left total knee arthroplasty.  We replaced her right knee in 2018.  She is still both knees are doing very well.  She is 71 years old.  She reports good range of motion and strength.  Her more recent left knee has full range of motion.  Feels ligamentously stable.  Her right knee also has full range of motion and feels stable.  Both knees have neutral alignment.  AP and lateral of the left knee shows a well-seated total knee arthroplasty with no complicating features.  The standing view also shows the AP of the right knee and both knees look good overall.  This point follow-up can be as needed since she is doing so well.  If she does develop any problems at all she knows to let us know.

## 2023-02-28 NOTE — Telephone Encounter (Signed)
Patient is 2 weeks shy of her 1 year mark for her Left total knee arthroplasty. Seen in office. Koos, jr. Completed.

## 2023-03-01 DIAGNOSIS — N2581 Secondary hyperparathyroidism of renal origin: Secondary | ICD-10-CM | POA: Diagnosis not present

## 2023-03-01 DIAGNOSIS — N186 End stage renal disease: Secondary | ICD-10-CM | POA: Diagnosis not present

## 2023-03-01 DIAGNOSIS — Z992 Dependence on renal dialysis: Secondary | ICD-10-CM | POA: Diagnosis not present

## 2023-03-04 DIAGNOSIS — N2581 Secondary hyperparathyroidism of renal origin: Secondary | ICD-10-CM | POA: Diagnosis not present

## 2023-03-04 DIAGNOSIS — N186 End stage renal disease: Secondary | ICD-10-CM | POA: Diagnosis not present

## 2023-03-04 DIAGNOSIS — Z992 Dependence on renal dialysis: Secondary | ICD-10-CM | POA: Diagnosis not present

## 2023-03-05 DIAGNOSIS — N2581 Secondary hyperparathyroidism of renal origin: Secondary | ICD-10-CM | POA: Diagnosis not present

## 2023-03-05 DIAGNOSIS — N186 End stage renal disease: Secondary | ICD-10-CM | POA: Diagnosis not present

## 2023-03-05 DIAGNOSIS — Z992 Dependence on renal dialysis: Secondary | ICD-10-CM | POA: Diagnosis not present

## 2023-03-06 DIAGNOSIS — N186 End stage renal disease: Secondary | ICD-10-CM | POA: Diagnosis not present

## 2023-03-06 DIAGNOSIS — N2581 Secondary hyperparathyroidism of renal origin: Secondary | ICD-10-CM | POA: Diagnosis not present

## 2023-03-06 DIAGNOSIS — Z992 Dependence on renal dialysis: Secondary | ICD-10-CM | POA: Diagnosis not present

## 2023-03-08 DIAGNOSIS — N2581 Secondary hyperparathyroidism of renal origin: Secondary | ICD-10-CM | POA: Diagnosis not present

## 2023-03-08 DIAGNOSIS — N186 End stage renal disease: Secondary | ICD-10-CM | POA: Diagnosis not present

## 2023-03-08 DIAGNOSIS — Z992 Dependence on renal dialysis: Secondary | ICD-10-CM | POA: Diagnosis not present

## 2023-03-11 DIAGNOSIS — N186 End stage renal disease: Secondary | ICD-10-CM | POA: Diagnosis not present

## 2023-03-11 DIAGNOSIS — Z992 Dependence on renal dialysis: Secondary | ICD-10-CM | POA: Diagnosis not present

## 2023-03-11 DIAGNOSIS — N2581 Secondary hyperparathyroidism of renal origin: Secondary | ICD-10-CM | POA: Diagnosis not present

## 2023-03-13 DIAGNOSIS — Z992 Dependence on renal dialysis: Secondary | ICD-10-CM | POA: Diagnosis not present

## 2023-03-13 DIAGNOSIS — N2581 Secondary hyperparathyroidism of renal origin: Secondary | ICD-10-CM | POA: Diagnosis not present

## 2023-03-13 DIAGNOSIS — N186 End stage renal disease: Secondary | ICD-10-CM | POA: Diagnosis not present

## 2023-03-15 DIAGNOSIS — Z992 Dependence on renal dialysis: Secondary | ICD-10-CM | POA: Diagnosis not present

## 2023-03-15 DIAGNOSIS — N186 End stage renal disease: Secondary | ICD-10-CM | POA: Diagnosis not present

## 2023-03-15 DIAGNOSIS — N2581 Secondary hyperparathyroidism of renal origin: Secondary | ICD-10-CM | POA: Diagnosis not present

## 2023-03-16 ENCOUNTER — Other Ambulatory Visit: Payer: Self-pay | Admitting: Internal Medicine

## 2023-03-18 DIAGNOSIS — N2581 Secondary hyperparathyroidism of renal origin: Secondary | ICD-10-CM | POA: Diagnosis not present

## 2023-03-18 DIAGNOSIS — Z992 Dependence on renal dialysis: Secondary | ICD-10-CM | POA: Diagnosis not present

## 2023-03-18 DIAGNOSIS — N186 End stage renal disease: Secondary | ICD-10-CM | POA: Diagnosis not present

## 2023-03-18 NOTE — Telephone Encounter (Signed)
Refill request for LORAZEPAM 0.5 MG TABLET   LOV - 02/26/23 Next OV - 01/28/24 Last refill - 02/13/23 #60/0

## 2023-03-19 ENCOUNTER — Ambulatory Visit: Payer: Self-pay

## 2023-03-19 NOTE — Patient Outreach (Signed)
Care Coordination   Follow Up Visit Note   03/19/2023 Name: Carrie [name] MRN: [id number] DOB: 1951-01-06  Carrie Kitchen [name] is a 72 y.o. year old female who sees Karie Schwalbe, MD for primary care. I spoke with  Carrie Kitchen [name] by phone today.  What matters to the patients health and wellness today?  Patient states  she is doing well.  Discussed recent follow up visit with primary care provider on 02/26/23.  Patient denies any increase in heart failure symptoms. She states she is doing well in dialysis.  Reports weight has been stable.  Patient reports recent blood pressure readings in dialysis have been 133/67, 151/80, and 123/80.   Patient reports having flu vaccine.  Patient reports having follow up visit with orthopedic doctor. She states her knee replacement is doing well and she has full ROM.  She states she only has to see her orthopedic doctor on an as needed basis.     Goals Addressed             This Visit's Progress    Management of chronic health conditions.       Interventions Today    Flowsheet Row Most Recent Value  Chronic Disease   Chronic disease during today's visit Congestive Heart Failure (CHF), Hypertension (HTN), Chronic Kidney Disease/End Stage Renal Disease (ESRD), Other  [left status post knee replacement]  General Interventions   General Interventions Discussed/Reviewed General Interventions Reviewed, Doctor Visits, Vaccines  [evaluation of current treatment plan for mentioned health conditions and patients adherence to plan as established by provider.  Assessed BP readings and HF symptoms.]  Doctor Visits Discussed/Reviewed Doctor Visits Reviewed, Annual Wellness Visits  [discussed recent primary care provider visit and orthopedic visit.]  Education Interventions   Education Provided Provided Education  Provided Verbal Education On Other  [Reviewed heart failure symptoms and action plan.  Advised to notify provider for any new or ongoing symptoms.]   Pharmacy Interventions   Pharmacy Dicussed/Reviewed Pharmacy Topics Reviewed  [reviewed recent medication dosage adjustment.]              SDOH assessments and interventions completed:  No     Care Coordination Interventions:  Yes, provided   Follow up plan: Follow up call scheduled for 05/23/23    Encounter Outcome:  Patient Visit Completed   George Ina RN,BSN,CCM [facility] Care Coordination [phone] direct line

## 2023-03-19 NOTE — Patient Instructions (Signed)
Visit Information  Thank you for taking time to visit with me today. Please don't hesitate to contact me if I can be of assistance to you.   Following are the goals we discussed today:   Goals Addressed             This Visit's Progress    Management of chronic health conditions.       Interventions Today    Flowsheet Row Most Recent Value  Chronic Disease   Chronic disease during today's visit Congestive Heart Failure (CHF), Hypertension (HTN), Chronic Kidney Disease/End Stage Renal Disease (ESRD)  General Interventions   General Interventions Discussed/Reviewed General Interventions Reviewed, Doctor Visits  [evaluation of current treatment plan for mentioned health conditions and patients adherence to plan as established by provider.  Assessed BP readings and HF symptoms.]  Doctor Visits Discussed/Reviewed Doctor Visits Reviewed, Annual Wellness Visits  Education Interventions   Education Provided Provided Education  Provided Verbal Education On Other  [Reviewed heart failure symptoms and action plan.  Advised to notify provider for any new or ongoing symptoms.]  Pharmacy Interventions   Pharmacy Dicussed/Reviewed Pharmacy Topics Reviewed  [reviewed recent medication dosage adjustment.]              Our next appointment is by telephone on 05/23/23 at 10 am  Please call the care guide team at [phone] if you need to cancel or reschedule your appointment.   If you are experiencing a Mental Health or Behavioral Health Crisis or need someone to talk to, please call the Suicide and Crisis Lifeline: 988 call 1-800-273-TALK (toll free, 24 hour hotline)  Patient verbalizes understanding of instructions and care plan provided today and agrees to view in MyChart. Active MyChart status and patient understanding of how to access instructions and care plan via MyChart confirmed with patient.     George Ina RN,BSN,CCM [facility] Care Coordination [phone] direct line

## 2023-03-20 DIAGNOSIS — Z992 Dependence on renal dialysis: Secondary | ICD-10-CM | POA: Diagnosis not present

## 2023-03-20 DIAGNOSIS — N186 End stage renal disease: Secondary | ICD-10-CM | POA: Diagnosis not present

## 2023-03-20 DIAGNOSIS — N2581 Secondary hyperparathyroidism of renal origin: Secondary | ICD-10-CM | POA: Diagnosis not present

## 2023-03-22 DIAGNOSIS — N186 End stage renal disease: Secondary | ICD-10-CM | POA: Diagnosis not present

## 2023-03-22 DIAGNOSIS — N2581 Secondary hyperparathyroidism of renal origin: Secondary | ICD-10-CM | POA: Diagnosis not present

## 2023-03-22 DIAGNOSIS — Z992 Dependence on renal dialysis: Secondary | ICD-10-CM | POA: Diagnosis not present

## 2023-03-24 ENCOUNTER — Other Ambulatory Visit: Payer: Self-pay | Admitting: Internal Medicine

## 2023-03-25 DIAGNOSIS — N2581 Secondary hyperparathyroidism of renal origin: Secondary | ICD-10-CM | POA: Diagnosis not present

## 2023-03-25 DIAGNOSIS — N186 End stage renal disease: Secondary | ICD-10-CM | POA: Diagnosis not present

## 2023-03-25 DIAGNOSIS — Z992 Dependence on renal dialysis: Secondary | ICD-10-CM | POA: Diagnosis not present

## 2023-03-27 DIAGNOSIS — N2581 Secondary hyperparathyroidism of renal origin: Secondary | ICD-10-CM | POA: Diagnosis not present

## 2023-03-27 DIAGNOSIS — N186 End stage renal disease: Secondary | ICD-10-CM | POA: Diagnosis not present

## 2023-03-27 DIAGNOSIS — Z992 Dependence on renal dialysis: Secondary | ICD-10-CM | POA: Diagnosis not present

## 2023-03-29 DIAGNOSIS — N186 End stage renal disease: Secondary | ICD-10-CM | POA: Diagnosis not present

## 2023-03-29 DIAGNOSIS — N2581 Secondary hyperparathyroidism of renal origin: Secondary | ICD-10-CM | POA: Diagnosis not present

## 2023-03-29 DIAGNOSIS — Z992 Dependence on renal dialysis: Secondary | ICD-10-CM | POA: Diagnosis not present

## 2023-04-01 DIAGNOSIS — E119 Type 2 diabetes mellitus without complications: Secondary | ICD-10-CM | POA: Diagnosis not present

## 2023-04-01 DIAGNOSIS — N186 End stage renal disease: Secondary | ICD-10-CM | POA: Diagnosis not present

## 2023-04-01 DIAGNOSIS — N2581 Secondary hyperparathyroidism of renal origin: Secondary | ICD-10-CM | POA: Diagnosis not present

## 2023-04-01 DIAGNOSIS — Z992 Dependence on renal dialysis: Secondary | ICD-10-CM | POA: Diagnosis not present

## 2023-04-02 ENCOUNTER — Ambulatory Visit (INDEPENDENT_AMBULATORY_CARE_PROVIDER_SITE_OTHER): Payer: Medicare HMO | Admitting: Vascular Surgery

## 2023-04-02 ENCOUNTER — Encounter (INDEPENDENT_AMBULATORY_CARE_PROVIDER_SITE_OTHER): Payer: Self-pay | Admitting: Vascular Surgery

## 2023-04-02 ENCOUNTER — Ambulatory Visit (INDEPENDENT_AMBULATORY_CARE_PROVIDER_SITE_OTHER): Payer: Medicare HMO

## 2023-04-02 VITALS — BP 159/81 | HR 65 | Resp 15 | Wt 211.0 lb

## 2023-04-02 DIAGNOSIS — I10 Essential (primary) hypertension: Secondary | ICD-10-CM | POA: Diagnosis not present

## 2023-04-02 DIAGNOSIS — Z992 Dependence on renal dialysis: Secondary | ICD-10-CM

## 2023-04-02 DIAGNOSIS — N186 End stage renal disease: Secondary | ICD-10-CM | POA: Diagnosis not present

## 2023-04-02 DIAGNOSIS — E785 Hyperlipidemia, unspecified: Secondary | ICD-10-CM | POA: Diagnosis not present

## 2023-04-02 NOTE — Assessment & Plan Note (Signed)
Her duplex today shows a patent left arm AV graft without obvious stenosis.  Currently doing well with her dialysis treatments.  Plan follow-up duplex in 6 months for continued monitoring of this graft which has had previous intervention.

## 2023-04-02 NOTE — Progress Notes (Signed)
MRN : [id number]  Carrie [name] is a 72 y.o. (1950/09/13) female who presents with chief complaint of  Chief Complaint  Patient presents with   Follow-up    6 month HDA  .  History of Present Illness: Patient returns today in follow up of her dialysis access.  She is doing well today.  She had a previous intervention on the access a little over a year ago.  She was having issues when she was in the hospital at [facility] earlier this year but when she got back to her outpatient center, they have been using the access well.  They are doing a great job of rotating her stick sites.  No major issues at her outpatient dialysis center.  Her duplex today shows a patent left arm AV graft without obvious stenosis.  Current Outpatient Medications  Medication Sig Dispense Refill   acetaminophen (TYLENOL) 500 MG tablet Take 1,000 mg by mouth every 6 (six) hours as needed for mild pain or moderate pain.     albuterol (VENTOLIN HFA) 108 (90 Base) MCG/ACT inhaler Inhale 2 puffs into the lungs every 6 (six) hours as needed for wheezing or shortness of breath. 8 g 2   amLODipine (NORVASC) 10 MG tablet Take 10 mg by mouth daily.     atorvastatin (LIPITOR) 40 MG tablet TAKE 40 MG BY MOUTH ONCE DAILY (Patient taking differently: Take 40 mg by mouth daily.) 90 tablet 3   hydrALAZINE (APRESOLINE) 25 MG tablet Take 1 tablet (25 mg total) by mouth 3 (three) times daily. 90 tablet 5   levETIRAcetam (KEPPRA) 500 MG tablet Take 1 tablet (500 mg total) by mouth 2 (two) times daily. 180 tablet 3   lidocaine-prilocaine (EMLA) cream Apply 1 application topically every Monday, Wednesday, and Friday with hemodialysis.     LORazepam (ATIVAN) 0.5 MG tablet TAKE 1 TABLET BY MOUTH TWICE A DAY AS NEEDED FOR ANXIETY 60 tablet 0   losartan (COZAAR) 100 MG tablet Take 100 mg by mouth daily.     metoprolol succinate (TOPROL-XL) 50 MG 24 hr tablet Take 50 mg by mouth daily.     multivitamin (RENA-VIT) TABS tablet Take 1 tablet  by mouth at bedtime. 30 tablet 0   omeprazole (PRILOSEC) 20 MG capsule TAKE 1 CAPSULE BY MOUTH EVERY DAY 90 capsule 3   ondansetron (ZOFRAN-ODT) 4 MG disintegrating tablet Take 4 mg by mouth every 8 (eight) hours as needed for nausea or vomiting.     sevelamer carbonate (RENVELA) 800 MG tablet Take 800-1,600 mg by mouth See admin instructions. Take 2 tablets (1600 mg) by mouth three times daily with meals and take 1 tablet (800 mg) by mouth twice daily with snacks     torsemide (DEMADEX) 20 MG tablet Take 20 mg by mouth daily. Every day (including on dialysis days)     No current facility-administered medications for this visit.    Past Medical History:  Diagnosis Date   Allergy    Anemia    Arthritis    "right knee" (10/26'/2017)   Chronic heart failure with preserved ejection fraction (HFpEF) (HCC)    a. 04/2021 Echo: EF >55%, GrI DD, nl RV fxn, triv MR; b. 12/2021 Echo: EF 60-65%, no rwma.   Demand ischemia (HCC)    a. 04/2021 HsTrop up to 73 in setting of volume overload; b. 10/2021 MV: EF 66% with small, mild reversible defect in the apical septal and mid anteroseptal myocardium which was felt to most likely  represent artifact.   Depression    ESRD (end stage renal disease) (HCC)    a. [facility] (Dr. Lamont Dowdy)   History of blood transfusion 2008   "when I had brain tumor surgery"   History of MRSA infection 2008   History of stress test    a. 07/2018 MV: EF 66%, no ischemia/infarct.   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 12/2021 Echo: EF 60-65%, no rwma, mild LVH, nl RV fxn, small pericardial effusion - small pocket of post wall, ~ 1cm.   Precordial chest pain    a. 10/2021 MV: low risk.   Type II diabetes mellitus (HCC) 2011   on metformin until yr ago- no med now - off due to kidneys    Past Surgical History:  Procedure Laterality Date   A/V FISTULAGRAM Left 03/10/2019   Procedure: A/V FISTULAGRAM;  Surgeon: Renford Dills,  MD;  Location: [facility];  Service: Cardiovascular;  Laterality: Left;   A/V FISTULAGRAM Left 07/14/2019   Procedure: A/V FISTULAGRAM;  Surgeon: Renford Dills, MD;  Location: [facility];  Service: Cardiovascular;  Laterality: Left;   A/V FISTULAGRAM Left 01/07/2020   Procedure: A/V FISTULAGRAM;  Surgeon: Renford Dills, MD;  Location: [facility];  Service: Cardiovascular;  Laterality: Left;   A/V FISTULAGRAM Left 11/14/2021   Procedure: A/V Fistulagram;  Surgeon: Renford Dills, MD;  Location: [facility];  Service: Cardiovascular;  Laterality: Left;   AV FISTULA PLACEMENT Left 11/07/2018   Procedure: ARTERIOVENOUS (AV) FISTULA CREATION ( BRACHIO BASILIC ) VS BRACHIAL AXILLARY GRAFT;  Surgeon: Renford Dills, MD;  Location: [facility] ORS;  Service: Vascular;  Laterality: Left;   BRAIN MENINGIOMA EXCISION  08/2006   Foramina magnum meningioma   CERVICAL DISCECTOMY  1996   Dr. Beverlyn Roux   CESAREAN SECTION  1981   COLONOSCOPY     COLONOSCOPY WITH PROPOFOL N/A 03/07/2021   Procedure: COLONOSCOPY WITH PROPOFOL;  Surgeon: Pasty Spillers, MD;  Location: [facility];  Service: Endoscopy;  Laterality: N/A;   DIALYSIS/PERMA CATHETER INSERTION N/A 08/04/2018   Procedure: DIALYSIS/PERMA CATHETER INSERTION;  Surgeon: Annice Needy, MD;  Location: [facility];  Service: Cardiovascular;  Laterality: N/A;   DIALYSIS/PERMA CATHETER REMOVAL N/A 12/25/2018   Procedure: DIALYSIS/PERMA CATHETER REMOVAL;  Surgeon: Annice Needy, MD;  Location: [facility];  Service: Cardiovascular;  Laterality: N/A;   DILATION AND CURETTAGE OF UTERUS     JOINT REPLACEMENT     right knee left hip   MULTIPLE TOOTH EXTRACTIONS  01/30/2023   Right wrist x-ray  2007   Deg. at 1st MCP   TOTAL HIP ARTHROPLASTY Left 03/27/2016   Procedure: LEFT TOTAL HIP ARTHROPLASTY ANTERIOR APPROACH;  Surgeon: Kathryne Hitch, MD;  Location: [facility] OR;  Service:  Orthopedics;  Laterality: Left;   TOTAL KNEE ARTHROPLASTY Right 04/30/2017   Procedure: RIGHT TOTAL KNEE ARTHROPLASTY;  Surgeon: Kathryne Hitch, MD;  Location: [facility] OR;  Service: Orthopedics;  Laterality: Right;   TOTAL KNEE ARTHROPLASTY Left 03/13/2022   Procedure: LEFT TOTAL KNEE ARTHROPLASTY;  Surgeon: Kathryne Hitch, MD;  Location: [facility] OR;  Service: Orthopedics;  Laterality: Left;   TUBAL LIGATION  1981     Social History   Tobacco Use   Smoking status: Never    Passive exposure: Past   Smokeless tobacco: Never  Vaping Use   Vaping  status: Never Used  Substance Use Topics   Alcohol use: No    Alcohol/week: 0.0 standard drinks of alcohol   Drug use: No       Family History  Problem Relation Age of Onset   Hypertension Other        Strong family history    Breast cancer Mother 42   Heart failure Sister    Heart disease Sister      Allergies  Allergen Reactions   Naproxen Sodium Anaphylaxis and Swelling   Sulfamethoxazole-Trimethoprim Anaphylaxis and Swelling     REVIEW OF SYSTEMS (Negative unless checked)   Constitutional: [] Weight loss  [] Fever  [] Chills Cardiac: [] Chest pain   [] Chest pressure   [] Palpitations   [] Shortness of breath when laying flat   [] Shortness of breath at rest   [x] Shortness of breath with exertion. Vascular:  [] Pain in legs with walking   [] Pain in legs at rest   [] Pain in legs when laying flat   [] Claudication   [] Pain in feet when walking  [] Pain in feet at rest  [] Pain in feet when laying flat   [] History of DVT   [] Phlebitis   [x] Swelling in legs   [] Varicose veins   [] Non-healing ulcers Pulmonary:   [] Uses home oxygen   [] Productive cough   [] Hemoptysis   [] Wheeze  [] COPD   [] Asthma Neurologic:  [] Dizziness  [] Blackouts   [] Seizures   [] History of stroke   [] History of TIA  [] Aphasia   [] Temporary blindness   [] Dysphagia   [] Weakness or numbness in arms   [] Weakness or numbness in legs Musculoskeletal:  [x] Arthritis    [] Joint swelling   [x] Joint pain   [] Low back pain Hematologic:  [] Easy bruising  [] Easy bleeding   [] Hypercoagulable state   [x] Anemic  [] Hepatitis Gastrointestinal:  [] Blood in stool   [] Vomiting blood  [] Gastroesophageal reflux/heartburn   [] Abdominal pain Genitourinary:  [x] Chronic kidney disease   [] Difficult urination  [] Frequent urination  [] Burning with urination   [] Hematuria Skin:  [] Rashes   [] Ulcers   [] Wounds Psychological:  [] History of anxiety   []  History of major depression.  Physical Examination  BP (!) 159/81 (BP Location: Right Arm)   Pulse 65   Resp 15   Wt 211 lb (95.7 kg)   BMI 37.98 kg/m  Gen:  WD/WN, NAD Head: [place]/AT, No temporalis wasting. Ear/Nose/Throat: Hearing grossly intact, nares w/o erythema or drainage Eyes: Conjunctiva clear. Sclera non-icteric Neck: Supple.  Trachea midline Pulmonary:  Good air movement, no use of accessory muscles.  Cardiac: RRR, no JVD Vascular:  good thrill left arm AVG Vessel Right Left  Radial Palpable Palpable               Musculoskeletal: M/S 5/5 throughout.  No deformity or atrophy. No LE edema. Neurologic: Sensation grossly intact in extremities.  Symmetrical.  Speech is fluent.  Psychiatric: Judgment intact, Mood & affect appropriate for pt's clinical situation. Dermatologic: No rashes or ulcers noted.  No cellulitis or open wounds.      Labs No results found for this or any previous visit (from the past 2160 hour(s)).  Radiology No results found.  Assessment/Plan  ESRD on hemodialysis (HCC) Her duplex today shows a patent left arm AV graft without obvious stenosis.  Currently doing well with her dialysis treatments.  Plan follow-up duplex in 6 months for continued monitoring of this graft which has had previous intervention.  Essential hypertension An underlying cause of ESRD and blood pressure control important in reducing the  progression of atherosclerotic disease. On appropriate oral medications.      Hyperlipidemia lipid control important in reducing the progression of atherosclerotic disease. Continue statin therapy  Festus Barren, MD  04/02/2023 10:31 AM    This note was created with Dragon medical transcription system.  Any errors from dictation are purely unintentional

## 2023-04-03 DIAGNOSIS — Z992 Dependence on renal dialysis: Secondary | ICD-10-CM | POA: Diagnosis not present

## 2023-04-03 DIAGNOSIS — N2581 Secondary hyperparathyroidism of renal origin: Secondary | ICD-10-CM | POA: Diagnosis not present

## 2023-04-03 DIAGNOSIS — N186 End stage renal disease: Secondary | ICD-10-CM | POA: Diagnosis not present

## 2023-04-04 DIAGNOSIS — Z992 Dependence on renal dialysis: Secondary | ICD-10-CM | POA: Diagnosis not present

## 2023-04-04 DIAGNOSIS — N186 End stage renal disease: Secondary | ICD-10-CM | POA: Diagnosis not present

## 2023-04-05 DIAGNOSIS — N186 End stage renal disease: Secondary | ICD-10-CM | POA: Diagnosis not present

## 2023-04-05 DIAGNOSIS — Z992 Dependence on renal dialysis: Secondary | ICD-10-CM | POA: Diagnosis not present

## 2023-04-05 DIAGNOSIS — N2581 Secondary hyperparathyroidism of renal origin: Secondary | ICD-10-CM | POA: Diagnosis not present

## 2023-04-08 DIAGNOSIS — N2581 Secondary hyperparathyroidism of renal origin: Secondary | ICD-10-CM | POA: Diagnosis not present

## 2023-04-08 DIAGNOSIS — N186 End stage renal disease: Secondary | ICD-10-CM | POA: Diagnosis not present

## 2023-04-08 DIAGNOSIS — Z992 Dependence on renal dialysis: Secondary | ICD-10-CM | POA: Diagnosis not present

## 2023-04-10 DIAGNOSIS — N186 End stage renal disease: Secondary | ICD-10-CM | POA: Diagnosis not present

## 2023-04-10 DIAGNOSIS — Z992 Dependence on renal dialysis: Secondary | ICD-10-CM | POA: Diagnosis not present

## 2023-04-10 DIAGNOSIS — N2581 Secondary hyperparathyroidism of renal origin: Secondary | ICD-10-CM | POA: Diagnosis not present

## 2023-04-12 DIAGNOSIS — N186 End stage renal disease: Secondary | ICD-10-CM | POA: Diagnosis not present

## 2023-04-12 DIAGNOSIS — Z992 Dependence on renal dialysis: Secondary | ICD-10-CM | POA: Diagnosis not present

## 2023-04-12 DIAGNOSIS — N2581 Secondary hyperparathyroidism of renal origin: Secondary | ICD-10-CM | POA: Diagnosis not present

## 2023-04-15 DIAGNOSIS — N2581 Secondary hyperparathyroidism of renal origin: Secondary | ICD-10-CM | POA: Diagnosis not present

## 2023-04-15 DIAGNOSIS — N186 End stage renal disease: Secondary | ICD-10-CM | POA: Diagnosis not present

## 2023-04-15 DIAGNOSIS — Z992 Dependence on renal dialysis: Secondary | ICD-10-CM | POA: Diagnosis not present

## 2023-04-17 DIAGNOSIS — N2581 Secondary hyperparathyroidism of renal origin: Secondary | ICD-10-CM | POA: Diagnosis not present

## 2023-04-17 DIAGNOSIS — Z992 Dependence on renal dialysis: Secondary | ICD-10-CM | POA: Diagnosis not present

## 2023-04-17 DIAGNOSIS — N186 End stage renal disease: Secondary | ICD-10-CM | POA: Diagnosis not present

## 2023-04-19 DIAGNOSIS — N2581 Secondary hyperparathyroidism of renal origin: Secondary | ICD-10-CM | POA: Diagnosis not present

## 2023-04-19 DIAGNOSIS — N186 End stage renal disease: Secondary | ICD-10-CM | POA: Diagnosis not present

## 2023-04-19 DIAGNOSIS — Z992 Dependence on renal dialysis: Secondary | ICD-10-CM | POA: Diagnosis not present

## 2023-04-22 DIAGNOSIS — N186 End stage renal disease: Secondary | ICD-10-CM | POA: Diagnosis not present

## 2023-04-22 DIAGNOSIS — Z992 Dependence on renal dialysis: Secondary | ICD-10-CM | POA: Diagnosis not present

## 2023-04-22 DIAGNOSIS — N2581 Secondary hyperparathyroidism of renal origin: Secondary | ICD-10-CM | POA: Diagnosis not present

## 2023-04-24 DIAGNOSIS — N2581 Secondary hyperparathyroidism of renal origin: Secondary | ICD-10-CM | POA: Diagnosis not present

## 2023-04-24 DIAGNOSIS — N186 End stage renal disease: Secondary | ICD-10-CM | POA: Diagnosis not present

## 2023-04-24 DIAGNOSIS — Z992 Dependence on renal dialysis: Secondary | ICD-10-CM | POA: Diagnosis not present

## 2023-04-26 DIAGNOSIS — N2581 Secondary hyperparathyroidism of renal origin: Secondary | ICD-10-CM | POA: Diagnosis not present

## 2023-04-26 DIAGNOSIS — Z992 Dependence on renal dialysis: Secondary | ICD-10-CM | POA: Diagnosis not present

## 2023-04-26 DIAGNOSIS — N186 End stage renal disease: Secondary | ICD-10-CM | POA: Diagnosis not present

## 2023-04-29 DIAGNOSIS — N2581 Secondary hyperparathyroidism of renal origin: Secondary | ICD-10-CM | POA: Diagnosis not present

## 2023-04-29 DIAGNOSIS — Z992 Dependence on renal dialysis: Secondary | ICD-10-CM | POA: Diagnosis not present

## 2023-04-29 DIAGNOSIS — N186 End stage renal disease: Secondary | ICD-10-CM | POA: Diagnosis not present

## 2023-05-01 DIAGNOSIS — N2581 Secondary hyperparathyroidism of renal origin: Secondary | ICD-10-CM | POA: Diagnosis not present

## 2023-05-01 DIAGNOSIS — N186 End stage renal disease: Secondary | ICD-10-CM | POA: Diagnosis not present

## 2023-05-01 DIAGNOSIS — Z992 Dependence on renal dialysis: Secondary | ICD-10-CM | POA: Diagnosis not present

## 2023-05-03 DIAGNOSIS — N2581 Secondary hyperparathyroidism of renal origin: Secondary | ICD-10-CM | POA: Diagnosis not present

## 2023-05-03 DIAGNOSIS — N186 End stage renal disease: Secondary | ICD-10-CM | POA: Diagnosis not present

## 2023-05-03 DIAGNOSIS — Z992 Dependence on renal dialysis: Secondary | ICD-10-CM | POA: Diagnosis not present

## 2023-05-04 DIAGNOSIS — N186 End stage renal disease: Secondary | ICD-10-CM | POA: Diagnosis not present

## 2023-05-04 DIAGNOSIS — Z992 Dependence on renal dialysis: Secondary | ICD-10-CM | POA: Diagnosis not present

## 2023-05-05 DIAGNOSIS — N186 End stage renal disease: Secondary | ICD-10-CM | POA: Diagnosis not present

## 2023-05-05 DIAGNOSIS — Z992 Dependence on renal dialysis: Secondary | ICD-10-CM | POA: Diagnosis not present

## 2023-05-06 DIAGNOSIS — N186 End stage renal disease: Secondary | ICD-10-CM | POA: Diagnosis not present

## 2023-05-06 DIAGNOSIS — Z992 Dependence on renal dialysis: Secondary | ICD-10-CM | POA: Diagnosis not present

## 2023-05-07 ENCOUNTER — Other Ambulatory Visit: Payer: Self-pay | Admitting: Internal Medicine

## 2023-05-08 DIAGNOSIS — N186 End stage renal disease: Secondary | ICD-10-CM | POA: Diagnosis not present

## 2023-05-08 DIAGNOSIS — Z992 Dependence on renal dialysis: Secondary | ICD-10-CM | POA: Diagnosis not present

## 2023-05-10 DIAGNOSIS — Z992 Dependence on renal dialysis: Secondary | ICD-10-CM | POA: Diagnosis not present

## 2023-05-10 DIAGNOSIS — N186 End stage renal disease: Secondary | ICD-10-CM | POA: Diagnosis not present

## 2023-05-13 DIAGNOSIS — N186 End stage renal disease: Secondary | ICD-10-CM | POA: Diagnosis not present

## 2023-05-13 DIAGNOSIS — Z992 Dependence on renal dialysis: Secondary | ICD-10-CM | POA: Diagnosis not present

## 2023-05-15 DIAGNOSIS — N186 End stage renal disease: Secondary | ICD-10-CM | POA: Diagnosis not present

## 2023-05-15 DIAGNOSIS — Z992 Dependence on renal dialysis: Secondary | ICD-10-CM | POA: Diagnosis not present

## 2023-05-17 DIAGNOSIS — Z992 Dependence on renal dialysis: Secondary | ICD-10-CM | POA: Diagnosis not present

## 2023-05-17 DIAGNOSIS — N186 End stage renal disease: Secondary | ICD-10-CM | POA: Diagnosis not present

## 2023-05-20 DIAGNOSIS — Z992 Dependence on renal dialysis: Secondary | ICD-10-CM | POA: Diagnosis not present

## 2023-05-20 DIAGNOSIS — N186 End stage renal disease: Secondary | ICD-10-CM | POA: Diagnosis not present

## 2023-05-22 DIAGNOSIS — N186 End stage renal disease: Secondary | ICD-10-CM | POA: Diagnosis not present

## 2023-05-22 DIAGNOSIS — Z992 Dependence on renal dialysis: Secondary | ICD-10-CM | POA: Diagnosis not present

## 2023-05-23 ENCOUNTER — Ambulatory Visit: Payer: Self-pay

## 2023-05-23 NOTE — Patient Outreach (Signed)
  Care Coordination   Follow Up Visit Note   05/23/2023 Name: Carrie [name] MRN: [id number] DOB: May 12, 1951  Carrie [name] Kitchen Carrie [name] is a 72 y.o. year old female who sees Carrie Schwalbe, MD for primary care. I spoke with  Carrie [name] Kitchen Carrie [name] by phone today.  What matters to the patients health and wellness today?  Patient states she is doing very well.  Patient states her blood pressures are ranging 120-130's/70-80's.  She denies  any heart failure symptoms   Patient denies any medication changes.    Goals Addressed             This Visit's Progress    Management of chronic health conditions.       Interventions Today    Flowsheet Row Most Recent Value  Chronic Disease   Chronic disease during today's visit Congestive Heart Failure (CHF), Hypertension (HTN), Chronic Kidney Disease/End Stage Renal Disease (ESRD)  General Interventions   General Interventions Discussed/Reviewed General Interventions Reviewed, Doctor Visits  [evaluation of current treatment plan for HF/ HTN/ CKD and patients adherence to plan as established by provider. Assessed for HF symptom, BP readings and any new or ongoing symptoms.]  Doctor Visits Discussed/Reviewed Doctor Visits Reviewed  [reviewed upcoming provider visits. Advised to keep follow up visit with provider as recommended.]  Education Interventions   Education Provided Provided Education  [reviewed HF symptoms. Advised to notify provider for mild/ moderate symptoms or call 911 for severe symptoms.]  Pharmacy Interventions   Pharmacy Dicussed/Reviewed Pharmacy Topics Reviewed  [Medications reviewed and compliance discussed. Advised to take all medications as prescribed.]                SDOH assessments and interventions completed:  No     Care Coordination Interventions:  Yes, provided   Follow up plan: Follow up call scheduled for 07/29/22    Encounter Outcome:  Patient Visit Completed   George Ina RN,BSN,CCM [facility]  [facility]  [facility], [facility] coordinator / Case Manager Phone: [phone]

## 2023-05-23 NOTE — Patient Instructions (Signed)
Visit Information  Thank you for taking time to visit with me today. Please don't hesitate to contact me if I can be of assistance to you.   Following are the goals we discussed today:   Goals Addressed             This Visit's Progress    Management of chronic health conditions.       Interventions Today    Flowsheet Row Most Recent Value  Chronic Disease   Chronic disease during today's visit Congestive Heart Failure (CHF), Hypertension (HTN), Chronic Kidney Disease/End Stage Renal Disease (ESRD)  General Interventions   General Interventions Discussed/Reviewed General Interventions Reviewed, Doctor Visits  [evaluation of current treatment plan for HF/ HTN/ CKD and patients adherence to plan as established by provider. Assessed for HF symptom, BP readings and any new or ongoing symptoms.]  Doctor Visits Discussed/Reviewed Doctor Visits Reviewed  [reviewed upcoming provider visits. Advised to keep follow up visit with provider as recommended.]  Education Interventions   Education Provided Provided Education  [reviewed HF symptoms. Advised to notify provider for mild/ moderate symptoms or call 911 for severe symptoms.]  Pharmacy Interventions   Pharmacy Dicussed/Reviewed Pharmacy Topics Reviewed  [Medications reviewed and compliance discussed. Advised to take all medications as prescribed.]                Our next appointment is by telephone on 07/30/23 at 10 am  Please call the care guide team at [phone] if you need to cancel or reschedule your appointment.   If you are experiencing a Mental Health or Behavioral Health Crisis or need someone to talk to, please call the Suicide and Crisis Lifeline: 988 call 1-800-273-TALK (toll free, 24 hour hotline)  Patient verbalizes understanding of instructions and care plan provided today and agrees to view in MyChart. Active MyChart status and patient understanding of how to access instructions and care plan via MyChart confirmed  with patient.     George Ina RN,BSN,CCM [facility]  [facility], [facility] coordinator / Case Manager Phone: [phone]

## 2023-05-24 DIAGNOSIS — Z992 Dependence on renal dialysis: Secondary | ICD-10-CM | POA: Diagnosis not present

## 2023-05-24 DIAGNOSIS — N186 End stage renal disease: Secondary | ICD-10-CM | POA: Diagnosis not present

## 2023-05-27 DIAGNOSIS — Z992 Dependence on renal dialysis: Secondary | ICD-10-CM | POA: Diagnosis not present

## 2023-05-27 DIAGNOSIS — N186 End stage renal disease: Secondary | ICD-10-CM | POA: Diagnosis not present

## 2023-05-30 DIAGNOSIS — N186 End stage renal disease: Secondary | ICD-10-CM | POA: Diagnosis not present

## 2023-05-30 DIAGNOSIS — Z992 Dependence on renal dialysis: Secondary | ICD-10-CM | POA: Diagnosis not present

## 2023-06-01 DIAGNOSIS — N186 End stage renal disease: Secondary | ICD-10-CM | POA: Diagnosis not present

## 2023-06-01 DIAGNOSIS — Z992 Dependence on renal dialysis: Secondary | ICD-10-CM | POA: Diagnosis not present

## 2023-06-03 DIAGNOSIS — Z992 Dependence on renal dialysis: Secondary | ICD-10-CM | POA: Diagnosis not present

## 2023-06-03 DIAGNOSIS — N186 End stage renal disease: Secondary | ICD-10-CM | POA: Diagnosis not present

## 2023-06-04 DIAGNOSIS — N186 End stage renal disease: Secondary | ICD-10-CM | POA: Diagnosis not present

## 2023-06-04 DIAGNOSIS — Z992 Dependence on renal dialysis: Secondary | ICD-10-CM | POA: Diagnosis not present

## 2023-06-05 DIAGNOSIS — N186 End stage renal disease: Secondary | ICD-10-CM | POA: Diagnosis not present

## 2023-06-05 DIAGNOSIS — Z992 Dependence on renal dialysis: Secondary | ICD-10-CM | POA: Diagnosis not present

## 2023-06-06 DIAGNOSIS — Z992 Dependence on renal dialysis: Secondary | ICD-10-CM | POA: Diagnosis not present

## 2023-06-06 DIAGNOSIS — N186 End stage renal disease: Secondary | ICD-10-CM | POA: Diagnosis not present

## 2023-06-08 DIAGNOSIS — N186 End stage renal disease: Secondary | ICD-10-CM | POA: Diagnosis not present

## 2023-06-08 DIAGNOSIS — Z992 Dependence on renal dialysis: Secondary | ICD-10-CM | POA: Diagnosis not present

## 2023-06-10 DIAGNOSIS — N186 End stage renal disease: Secondary | ICD-10-CM | POA: Diagnosis not present

## 2023-06-10 DIAGNOSIS — E119 Type 2 diabetes mellitus without complications: Secondary | ICD-10-CM | POA: Diagnosis not present

## 2023-06-10 DIAGNOSIS — Z992 Dependence on renal dialysis: Secondary | ICD-10-CM | POA: Diagnosis not present

## 2023-06-12 DIAGNOSIS — N186 End stage renal disease: Secondary | ICD-10-CM | POA: Diagnosis not present

## 2023-06-12 DIAGNOSIS — Z992 Dependence on renal dialysis: Secondary | ICD-10-CM | POA: Diagnosis not present

## 2023-06-14 ENCOUNTER — Other Ambulatory Visit: Payer: Self-pay | Admitting: Internal Medicine

## 2023-06-14 DIAGNOSIS — N186 End stage renal disease: Secondary | ICD-10-CM | POA: Diagnosis not present

## 2023-06-14 DIAGNOSIS — Z992 Dependence on renal dialysis: Secondary | ICD-10-CM | POA: Diagnosis not present

## 2023-06-17 DIAGNOSIS — N186 End stage renal disease: Secondary | ICD-10-CM | POA: Diagnosis not present

## 2023-06-17 DIAGNOSIS — Z992 Dependence on renal dialysis: Secondary | ICD-10-CM | POA: Diagnosis not present

## 2023-06-18 ENCOUNTER — Other Ambulatory Visit: Payer: Self-pay | Admitting: Internal Medicine

## 2023-06-19 DIAGNOSIS — Z992 Dependence on renal dialysis: Secondary | ICD-10-CM | POA: Diagnosis not present

## 2023-06-19 DIAGNOSIS — N186 End stage renal disease: Secondary | ICD-10-CM | POA: Diagnosis not present

## 2023-06-21 DIAGNOSIS — N186 End stage renal disease: Secondary | ICD-10-CM | POA: Diagnosis not present

## 2023-06-21 DIAGNOSIS — Z992 Dependence on renal dialysis: Secondary | ICD-10-CM | POA: Diagnosis not present

## 2023-06-24 DIAGNOSIS — N186 End stage renal disease: Secondary | ICD-10-CM | POA: Diagnosis not present

## 2023-06-24 DIAGNOSIS — Z992 Dependence on renal dialysis: Secondary | ICD-10-CM | POA: Diagnosis not present

## 2023-06-26 DIAGNOSIS — Z992 Dependence on renal dialysis: Secondary | ICD-10-CM | POA: Diagnosis not present

## 2023-06-26 DIAGNOSIS — N186 End stage renal disease: Secondary | ICD-10-CM | POA: Diagnosis not present

## 2023-06-28 DIAGNOSIS — Z992 Dependence on renal dialysis: Secondary | ICD-10-CM | POA: Diagnosis not present

## 2023-06-28 DIAGNOSIS — N186 End stage renal disease: Secondary | ICD-10-CM | POA: Diagnosis not present

## 2023-07-01 DIAGNOSIS — Z992 Dependence on renal dialysis: Secondary | ICD-10-CM | POA: Diagnosis not present

## 2023-07-01 DIAGNOSIS — N186 End stage renal disease: Secondary | ICD-10-CM | POA: Diagnosis not present

## 2023-07-03 DIAGNOSIS — N186 End stage renal disease: Secondary | ICD-10-CM | POA: Diagnosis not present

## 2023-07-03 DIAGNOSIS — Z992 Dependence on renal dialysis: Secondary | ICD-10-CM | POA: Diagnosis not present

## 2023-07-05 DIAGNOSIS — Z992 Dependence on renal dialysis: Secondary | ICD-10-CM | POA: Diagnosis not present

## 2023-07-05 DIAGNOSIS — N186 End stage renal disease: Secondary | ICD-10-CM | POA: Diagnosis not present

## 2023-07-06 DIAGNOSIS — Z992 Dependence on renal dialysis: Secondary | ICD-10-CM | POA: Diagnosis not present

## 2023-07-06 DIAGNOSIS — N186 End stage renal disease: Secondary | ICD-10-CM | POA: Diagnosis not present

## 2023-07-08 DIAGNOSIS — Z992 Dependence on renal dialysis: Secondary | ICD-10-CM | POA: Diagnosis not present

## 2023-07-08 DIAGNOSIS — N186 End stage renal disease: Secondary | ICD-10-CM | POA: Diagnosis not present

## 2023-07-10 DIAGNOSIS — Z992 Dependence on renal dialysis: Secondary | ICD-10-CM | POA: Diagnosis not present

## 2023-07-10 DIAGNOSIS — N186 End stage renal disease: Secondary | ICD-10-CM | POA: Diagnosis not present

## 2023-07-12 DIAGNOSIS — Z992 Dependence on renal dialysis: Secondary | ICD-10-CM | POA: Diagnosis not present

## 2023-07-12 DIAGNOSIS — N186 End stage renal disease: Secondary | ICD-10-CM | POA: Diagnosis not present

## 2023-07-15 DIAGNOSIS — N186 End stage renal disease: Secondary | ICD-10-CM | POA: Diagnosis not present

## 2023-07-15 DIAGNOSIS — Z992 Dependence on renal dialysis: Secondary | ICD-10-CM | POA: Diagnosis not present

## 2023-07-16 ENCOUNTER — Ambulatory Visit: Payer: Medicare HMO | Admitting: Cardiology

## 2023-07-16 NOTE — Progress Notes (Deleted)
 Cardiology Office Note:  .   Date:  07/16/2023  ID:  Carrie [name], DOB Feb 18, 1951, MRN [id number] PCP: Carrie Schwalbe, MD  [facility] Providers Cardiologist:  Julien Nordmann, MD { Click to update primary MD,subspecialty MD or APP then REFRESH:1}   History of Present Illness: Carrie [name]   Carrie [name] is a 73 y.o. female with a past medical history of HFpEF, hypertension, hyperlipidemia, type 2 diabetes, anemia, end-stage renal disease, obesity, seizures, meningioma, who presents today for follow-up of her HFpEF.   Previously had an echocardiogram completed in May 2019 which showed EF of 60 to 65% with G1 DD and mild aortic stenosis.  She underwent stress test in February 2020 which was low risk without ischemia or infarct.  In November 2022 she was admitted to [facility] with dyspnea and volume overload.  VQ scan was low probability for PE and lower extremity ultrasounds were negative for DVT.  She was dialyzed with improvement in her overall volume status.  Repeat echocardiogram completed during admission showed an EF of greater than 55% with G1 DD and trivial MR.  During hospitalization high-sensitivity troponin peaked at 73.  She followed up in clinic in May 2023 and noted occasional chest pressure.  In light of symptoms of previous demand ischemia she underwent Lexiscan Myoview which was low risk with a small, mild, reversible defect in the apical septal and mid anteroseptal myocardium which was favored to represent artifact or ischemia.  Imaging also showed fluid density structures adjacent to the right atrium and the left lateral wall of the left ventricle most consistent with loculated pericardial effusion.  Echocardiogram was performed which showed an EF of 60 to 65% with small pericardial effusion of the posterior wall.   She was last seen in clinic 12/22/2022 by Dr. Mariah [name] with complaint of shortness of breath with exertion.  Discussion was held with nephrology about whether she should  take higher doses of torsemide on nondialysis days as fluid removal is typically done through dialysis.  There were no other changes to her medication made and no further testing that was ordered.  She returns to clinic today  ROS: 10 point review of systems has been reviewed and considered negative with exception was been listed in the HPI  Studies Reviewed: .       2D echo 12/07/2021 1. Left ventricular ejection fraction, by estimation, is 60 to 65%. The  left ventricle has normal function. The left ventricle has no regional  wall motion abnormalities. There is mild left ventricular hypertrophy.  Left ventricular diastolic parameters  are indeterminate.   2. Right ventricular systolic function is normal. The right ventricular  size is normal. Tricuspid regurgitation signal is inadequate for assessing  PA pressure.   3. A small pericardial effusion is present, small pocket off the  posterior wall , estimated at 1 cm.   4. The mitral valve is normal in structure. No evidence of mitral valve  regurgitation.   5. The aortic valve is normal in structure. Aortic valve regurgitation is  not visualized.   6. The inferior vena cava is normal in size with greater than 50%  respiratory variability, suggesting right atrial pressure of 3 mmHg.   Lexiscan Myoview 10/24/21   Low risk, probably normal pharmacologic myocardial perfusion stress test.   There is a small in size, mild in severity, reversible defect involving the apical septal and mid anteroseptal myocardium that most likely represents artifact (shifting breast attenuation and RV insertion) and less  likely ischemia.  This defect is new compared to the study from 08/01/2018.   Left ventricular function is normal (LVEF 66%).   There is no significant coronary artery calcification.  Aortic atherosclerosis is noted.   There is a fluid density structure adjacent to the right atrium that is not significantly different compared to the prior study  from 08/01/2018.  There is also small fluid collection adjacent to the lateral wall of the left ventricle.  Findings are most consistent with loculated pericardial effusion; pericardial cyst is felt less likely.   Sensitivity and specificity of the study are degraded by body habitus and extracardiac activity.  Risk Assessment/Calculations:     No BP recorded.  {Refresh Note OR Click here to enter BP  :1}***       Physical Exam:   VS:  There were no vitals taken for this visit.   Wt Readings from Last 3 Encounters:  04/02/23 211 lb (95.7 kg)  02/26/23 212 lb (96.2 kg)  01/01/23 209 lb (94.8 kg)    GEN: Well nourished, well developed in no acute distress NECK: No JVD; No carotid bruits CARDIAC: ***RRR, no murmurs, rubs, gallops RESPIRATORY:  Clear to auscultation without rales, wheezing or rhonchi  ABDOMEN: Soft, non-tender, non-distended EXTREMITIES:  No edema; No deformity   ASSESSMENT AND PLAN: .    chronic HFpEF with shortness of breath Primary hypertension Mixed hyperlipidemia End-stage renal disease on hemodialysis Type 2 diabetes Morbid obesity    {Are you ordering a CV Procedure (e.g. stress test, cath, DCCV, TEE, etc)?   Press F2        :[id number]}  Dispo: ***  Signed, [name], NP

## 2023-07-17 DIAGNOSIS — Z992 Dependence on renal dialysis: Secondary | ICD-10-CM | POA: Diagnosis not present

## 2023-07-17 DIAGNOSIS — N186 End stage renal disease: Secondary | ICD-10-CM | POA: Diagnosis not present

## 2023-07-18 ENCOUNTER — Other Ambulatory Visit: Payer: Self-pay | Admitting: Internal Medicine

## 2023-07-18 NOTE — Telephone Encounter (Signed)
Last filled 06-18-23 Last OV 02-26-23 Next OV 01-28-24 [facility]

## 2023-07-19 DIAGNOSIS — Z992 Dependence on renal dialysis: Secondary | ICD-10-CM | POA: Diagnosis not present

## 2023-07-19 DIAGNOSIS — N186 End stage renal disease: Secondary | ICD-10-CM | POA: Diagnosis not present

## 2023-07-22 DIAGNOSIS — Z992 Dependence on renal dialysis: Secondary | ICD-10-CM | POA: Diagnosis not present

## 2023-07-22 DIAGNOSIS — N186 End stage renal disease: Secondary | ICD-10-CM | POA: Diagnosis not present

## 2023-07-23 DIAGNOSIS — N186 End stage renal disease: Secondary | ICD-10-CM | POA: Diagnosis not present

## 2023-07-23 DIAGNOSIS — Z992 Dependence on renal dialysis: Secondary | ICD-10-CM | POA: Diagnosis not present

## 2023-07-25 ENCOUNTER — Ambulatory Visit: Payer: Medicare HMO | Admitting: Cardiology

## 2023-07-26 DIAGNOSIS — Z992 Dependence on renal dialysis: Secondary | ICD-10-CM | POA: Diagnosis not present

## 2023-07-26 DIAGNOSIS — N186 End stage renal disease: Secondary | ICD-10-CM | POA: Diagnosis not present

## 2023-07-29 DIAGNOSIS — Z992 Dependence on renal dialysis: Secondary | ICD-10-CM | POA: Diagnosis not present

## 2023-07-29 DIAGNOSIS — N186 End stage renal disease: Secondary | ICD-10-CM | POA: Diagnosis not present

## 2023-07-30 ENCOUNTER — Ambulatory Visit: Payer: Self-pay

## 2023-07-30 NOTE — Patient Outreach (Signed)
 Care Coordination   Follow Up Visit Note   07/30/2023 Name: Carrie [name] MRN: [id number] DOB: 06-20-1950  Carrie Kitchen [name] is a 73 y.o. year old female who sees Karie Schwalbe, MD for primary care. I spoke with  Carrie Kitchen [name] by phone today.  What matters to the patients health and wellness today?  Patient states she has recently experienced loss in her family and tragic loss of a friends family members.  She expressed this was very emotional for her.  Patient reflected on loss of her daughter and husband in 2014.  She states she requested a refill of her anxiety medication from her primary care provider.  Patient declined offer to refer to social worker for grief counseling.  Patient states she had mild increase in LE swelling and some SOB on this past Saturday 07/27/23. She states she went out to eat with family to celebrate a birthday and felt what she ate may have caused these symptoms.  Patient states symptoms resolved. She reports having dialysis on yesterday and states quite a bit of fluid was pulled off.   Patient states her blood pressures are ranging 140-160's/80's.  She states her blood pressures have gone back up some.   Patient states she is having swallowing issues. She states her food and/ or pills seem to get stuck in her throat when swallowing. She states she occasionally has coughing spells.   Patient states she has discussed this with her dialysis doctor and they recommended referring her to a specialist. Patient states she will follow up with the nurse on Friday 08/02/23 regarding the referral. Patient denies any medication or overall treatment changes.    Goals Addressed             This Visit's Progress    Management of chronic health conditions.       Interventions Today    Flowsheet Row Most Recent Value  Chronic Disease   Chronic disease during today's visit Congestive Heart Failure (CHF), Hypertension (HTN), Chronic Kidney Disease/End Stage Renal Disease (ESRD)   General Interventions   General Interventions Discussed/Reviewed General Interventions Reviewed, Doctor Visits  [evaluation of current treatment plan for listed health conditions and patients adherence to plan as established by provider.  Assessed for BP readings and HF symptoms.]  Doctor Visits Discussed/Reviewed Doctor Visits Reviewed  [name] upcoming provider visits. Advised to keep follow up visits with providers.  Advised to follow up with dialysis nurse regarding specialist referral for swallowing issues. Contact RNCM if referral assistance is needed.]  Education Interventions   Education Provided Provided Education  [reviewed heart failure symptoms. Advised to follow HF action plan. Advised to notify provider of increase in HF symptoms. Use oxygen as needed. Continue to monitor BP on non dialysis days and record. Let provider know of abnormal readings.]  Provided Verbal Education On Other  [Advised to eat small bites of food and chew food well.]  Mental Health Interventions   Mental Health Discussed/Reviewed Mental Health Reviewed, Coping Strategies, Grief and Loss  [Active listening and support. Discussed coping strategies, Discussed recent family/ friend loss.  Offered referral to licensed clinical SW for counseling follow up]  Pharmacy Interventions   Pharmacy Dicussed/Reviewed Pharmacy Topics Reviewed  [medications reviewed and compliance discussed.]                SDOH assessments and interventions completed:  No     Care Coordination Interventions:  Yes, provided   Follow up plan: Follow up call scheduled for  08/27/23 at 11 am    Encounter Outcome:  Patient Visit Completed   George Ina RN, BSN, CCM [place]  [facility], Population Health Case Manager Phone: [phone]

## 2023-07-30 NOTE — Patient Instructions (Signed)
 Visit Information  Thank you for taking time to visit with me today. Please don't hesitate to contact me if I can be of assistance to you.   Following are the goals we discussed today:   Goals Addressed             This Visit's Progress    Management of chronic health conditions.       Interventions Today    Flowsheet Row Most Recent Value  Chronic Disease   Chronic disease during today's visit Congestive Heart Failure (CHF), Hypertension (HTN), Chronic Kidney Disease/End Stage Renal Disease (ESRD)  General Interventions   General Interventions Discussed/Reviewed General Interventions Reviewed, Doctor Visits  [evaluation of current treatment plan for listed health conditions and patients adherence to plan as established by provider.  Assessed for BP readings and HF symptoms.]  Doctor Visits Discussed/Reviewed Doctor Visits Reviewed  [name] upcoming provider visits. Advised to keep follow up visits with providers.  Advised to follow up with dialysis nurse regarding specialist referral for swallowing issues. Contact RNCM if referral assistance is needed.]  Education Interventions   Education Provided Provided Education  [reviewed heart failure symptoms. Advised to follow HF action plan. Advised to notify provider of increase in HF symptoms. Use oxygen as needed. Continue to monitor BP on non dialysis days and record. Let provider know of abnormal readings.]  Provided Verbal Education On Other  [Advised to eat small bites of food and chew food well.]  Mental Health Interventions   Mental Health Discussed/Reviewed Mental Health Reviewed, Coping Strategies, Grief and Loss  [Active listening and support. Discussed coping strategies, Discussed recent family/ friend loss.  Offered referral to licensed clinical SW for counseling follow up]  Pharmacy Interventions   Pharmacy Dicussed/Reviewed Pharmacy Topics Reviewed  [medications reviewed and compliance discussed.]                Our  next appointment is by telephone on 08/27/23 at 11 am  Please call the care guide team at [phone] if you need to cancel or reschedule your appointment.   If you are experiencing a Mental Health or Behavioral Health Crisis or need someone to talk to, please call the Suicide and Crisis Lifeline: 988 call 1-800-273-TALK (toll free, 24 hour hotline)  Patient verbalizes understanding of instructions and care plan provided today and agrees to view in MyChart. Active MyChart status and patient understanding of how to access instructions and care plan via MyChart confirmed with patient.     George Ina RN, BSN, CCM [employer], Population Health Case Manager Phone: [phone]

## 2023-07-31 DIAGNOSIS — Z992 Dependence on renal dialysis: Secondary | ICD-10-CM | POA: Diagnosis not present

## 2023-07-31 DIAGNOSIS — N186 End stage renal disease: Secondary | ICD-10-CM | POA: Diagnosis not present

## 2023-08-02 DIAGNOSIS — Z992 Dependence on renal dialysis: Secondary | ICD-10-CM | POA: Diagnosis not present

## 2023-08-02 DIAGNOSIS — N186 End stage renal disease: Secondary | ICD-10-CM | POA: Diagnosis not present

## 2023-08-03 DIAGNOSIS — Z992 Dependence on renal dialysis: Secondary | ICD-10-CM | POA: Diagnosis not present

## 2023-08-03 DIAGNOSIS — N186 End stage renal disease: Secondary | ICD-10-CM | POA: Diagnosis not present

## 2023-08-05 DIAGNOSIS — Z992 Dependence on renal dialysis: Secondary | ICD-10-CM | POA: Diagnosis not present

## 2023-08-05 DIAGNOSIS — N186 End stage renal disease: Secondary | ICD-10-CM | POA: Diagnosis not present

## 2023-08-07 DIAGNOSIS — N186 End stage renal disease: Secondary | ICD-10-CM | POA: Diagnosis not present

## 2023-08-07 DIAGNOSIS — Z992 Dependence on renal dialysis: Secondary | ICD-10-CM | POA: Diagnosis not present

## 2023-08-08 ENCOUNTER — Ambulatory Visit
Admission: RE | Admit: 2023-08-08 | Discharge: 2023-08-08 | Disposition: A | Discharge status: Home / Self Care | Source: Ambulatory Visit | Attending: Internal Medicine | Admitting: Internal Medicine

## 2023-08-08 ENCOUNTER — Encounter: Payer: Self-pay | Admitting: Internal Medicine

## 2023-08-08 ENCOUNTER — Ambulatory Visit (INDEPENDENT_AMBULATORY_CARE_PROVIDER_SITE_OTHER): Admitting: Internal Medicine

## 2023-08-08 VITALS — BP 138/72 | HR 72 | Temp 98.0°F | Ht 62.5 in | Wt 214.0 lb

## 2023-08-08 DIAGNOSIS — I517 Cardiomegaly: Secondary | ICD-10-CM | POA: Diagnosis not present

## 2023-08-08 DIAGNOSIS — R053 Chronic cough: Secondary | ICD-10-CM

## 2023-08-08 DIAGNOSIS — R059 Cough, unspecified: Secondary | ICD-10-CM | POA: Diagnosis not present

## 2023-08-08 DIAGNOSIS — R1319 Other dysphagia: Secondary | ICD-10-CM | POA: Insufficient documentation

## 2023-08-08 DIAGNOSIS — R058 Other specified cough: Secondary | ICD-10-CM | POA: Insufficient documentation

## 2023-08-08 MED ORDER — BENZONATATE 200 MG PO CAPS: 200.0000 mg | ORAL_CAPSULE | Freq: Three times a day (TID) | ORAL | 0 refills | Status: DC | PRN

## 2023-08-08 MED ORDER — OMEPRAZOLE 20 MG PO CPDR: 20.0000 mg | DELAYED_RELEASE_CAPSULE | Freq: Two times a day (BID) | ORAL | 3 refills | Status: DC

## 2023-08-08 MED ORDER — OMEPRAZOLE 20 MG PO CPDR: 20.0000 mg | DELAYED_RELEASE_CAPSULE | Freq: Two times a day (BID) | ORAL | 3 refills | Status: AC

## 2023-08-08 NOTE — Assessment & Plan Note (Signed)
 I suspect this is related to the chronic cough---both have worsened recently Will increase omeprazole to 20mg  bid GI referral ---will likely need EGD

## 2023-08-08 NOTE — Progress Notes (Signed)
 Subjective:    Patient ID: Carrie [name], female    DOB: 1951-04-10, 73 y.o.   MRN: [id number]  HPI Here with sister due to cough  Has been coughing for a long time--a few months Worse in the past 3 weeks Will be so bad it makes her SOB Drinking water, cough drops, life savers (while at dialysis)  Not sick No signs of infection  Day and night Having a hard time swallowing--but not eating much. Will come on while eating--but at other times also Dysphagia since brain surgery (long ago)--but worse now  Takes omeprazole daily on empty stomach  Current Outpatient Medications on File Prior to Visit  Medication Sig Dispense Refill   acetaminophen (TYLENOL) 500 MG tablet Take 1,000 mg by mouth every 6 (six) hours as needed for mild pain or moderate pain.     albuterol (VENTOLIN HFA) 108 (90 Base) MCG/ACT inhaler Inhale 2 puffs into the lungs every 6 (six) hours as needed for wheezing or shortness of breath. 8 g 2   amLODipine (NORVASC) 10 MG tablet Take 10 mg by mouth daily.     atorvastatin (LIPITOR) 40 MG tablet TAKE 40 MG BY MOUTH ONCE DAILY (Patient taking differently: Take 40 mg by mouth daily.) 90 tablet 3   hydrALAZINE (APRESOLINE) 25 MG tablet TAKE 1 TABLET BY MOUTH THREE TIMES A DAY 270 tablet 1   levETIRAcetam (KEPPRA) 500 MG tablet Take 1 tablet (500 mg total) by mouth 2 (two) times daily. 180 tablet 3   lidocaine-prilocaine (EMLA) cream Apply 1 application topically every Monday, Wednesday, and Friday with hemodialysis.     LORazepam (ATIVAN) 0.5 MG tablet TAKE 1 TABLET BY MOUTH TWICE A DAY AS NEEDED FOR ANXIETY 60 tablet 0   losartan (COZAAR) 100 MG tablet Take 100 mg by mouth daily.     metoprolol succinate (TOPROL-XL) 50 MG 24 hr tablet Take 50 mg by mouth daily.     multivitamin (RENA-VIT) TABS tablet Take 1 tablet by mouth at bedtime. 30 tablet 0   omeprazole (PRILOSEC) 20 MG capsule TAKE 1 CAPSULE BY MOUTH EVERY DAY 90 capsule 3   ondansetron (ZOFRAN-ODT) 4 MG  disintegrating tablet Take 4 mg by mouth every 8 (eight) hours as needed for nausea or vomiting.     sevelamer carbonate (RENVELA) 800 MG tablet Take 800-1,600 mg by mouth See admin instructions. Take 2 tablets (1600 mg) by mouth three times daily with meals and take 1 tablet (800 mg) by mouth twice daily with snacks     torsemide (DEMADEX) 20 MG tablet Take 20 mg by mouth daily. Every day (including on dialysis days)     No current facility-administered medications on file prior to visit.    Allergies  Allergen Reactions   Naproxen Sodium Anaphylaxis and Swelling   Sulfamethoxazole-Trimethoprim Anaphylaxis and Swelling    Past Medical History:  Diagnosis Date   Allergy    Anemia    Arthritis    "right knee" (10/26'/2017)   Chronic heart failure with preserved ejection fraction (HFpEF) (HCC)    a. 04/2021 Echo: EF >55%, GrI DD, nl RV fxn, triv MR; b. 12/2021 Echo: EF 60-65%, no rwma.   Demand ischemia (HCC)    a. 04/2021 HsTrop up to 73 in setting of volume overload; b. 10/2021 MV: EF 66% with small, mild reversible defect in the apical septal and mid anteroseptal myocardium which was felt to most likely represent artifact.   Depression    ESRD (end stage renal disease) (  HCC)    a. [facility] (Dr. Lamont Dowdy)   History of blood transfusion 2008   "when I had brain tumor surgery"   History of MRSA infection 2008   History of stress test    a. 07/2018 MV: EF 66%, no ischemia/infarct.   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 12/2021 Echo: EF 60-65%, no rwma, mild LVH, nl RV fxn, small pericardial effusion - small pocket of post wall, ~ 1cm.   Precordial chest pain    a. 10/2021 MV: low risk.   Type II diabetes mellitus (HCC) 2011   on metformin until yr ago- no med now - off due to kidneys    Past Surgical History:  Procedure Laterality Date   A/V FISTULAGRAM Left 03/10/2019   Procedure: A/V FISTULAGRAM;  Surgeon: Renford Dills, MD;   Location: [facility];  Service: Cardiovascular;  Laterality: Left;   A/V FISTULAGRAM Left 07/14/2019   Procedure: A/V FISTULAGRAM;  Surgeon: Renford Dills, MD;  Location: [facility];  Service: Cardiovascular;  Laterality: Left;   A/V FISTULAGRAM Left 01/07/2020   Procedure: A/V FISTULAGRAM;  Surgeon: Renford Dills, MD;  Location: [facility];  Service: Cardiovascular;  Laterality: Left;   A/V FISTULAGRAM Left 11/14/2021   Procedure: A/V Fistulagram;  Surgeon: Renford Dills, MD;  Location: [facility];  Service: Cardiovascular;  Laterality: Left;   AV FISTULA PLACEMENT Left 11/07/2018   Procedure: ARTERIOVENOUS (AV) FISTULA CREATION ( BRACHIO BASILIC ) VS BRACHIAL AXILLARY GRAFT;  Surgeon: Renford Dills, MD;  Location: [facility];  Service: Vascular;  Laterality: Left;   BRAIN MENINGIOMA EXCISION  08/2006   Foramina magnum meningioma   CERVICAL DISCECTOMY  1996   Dr. Beverlyn Roux   CESAREAN SECTION  1981   COLONOSCOPY     COLONOSCOPY WITH PROPOFOL N/A 03/07/2021   Procedure: COLONOSCOPY WITH PROPOFOL;  Surgeon: Pasty Spillers, MD;  Location: [facility];  Service: Endoscopy;  Laterality: N/A;   DIALYSIS/PERMA CATHETER INSERTION N/A 08/04/2018   Procedure: DIALYSIS/PERMA CATHETER INSERTION;  Surgeon: Annice Needy, MD;  Location: [facility];  Service: Cardiovascular;  Laterality: N/A;   DIALYSIS/PERMA CATHETER REMOVAL N/A 12/25/2018   Procedure: DIALYSIS/PERMA CATHETER REMOVAL;  Surgeon: Annice Needy, MD;  Location: [facility];  Service: Cardiovascular;  Laterality: N/A;   DILATION AND CURETTAGE OF UTERUS     JOINT REPLACEMENT     right knee left hip   MULTIPLE TOOTH EXTRACTIONS  01/30/2023   Right wrist x-ray  2007   Deg. at 1st MCP   TOTAL HIP ARTHROPLASTY Left 03/27/2016   Procedure: LEFT TOTAL HIP ARTHROPLASTY ANTERIOR APPROACH;  Surgeon: Kathryne Hitch, MD;  [facility];  Service: Orthopedics;   Laterality: Left;   TOTAL KNEE ARTHROPLASTY Right 04/30/2017   Procedure: RIGHT TOTAL KNEE ARTHROPLASTY;  Surgeon: Kathryne Hitch, MD;  Location: [facility] OR;  Service: Orthopedics;  Laterality: Right;   TOTAL KNEE ARTHROPLASTY Left 03/13/2022   Procedure: LEFT TOTAL KNEE ARTHROPLASTY;  Surgeon: Kathryne Hitch, MD;  Location: [facility] OR;  Service: Orthopedics;  Laterality: Left;   TUBAL LIGATION  1981    Family History  Problem Relation Age of Onset   Hypertension Other        Strong family history    Breast cancer Mother 70   Heart failure Sister    Heart disease Sister  Social History   Socioeconomic History   Marital status: Widowed    Spouse name: Not on file   Number of children: 1   Years of education: Not on file   Highest education level: 12th grade  Occupational History   Occupation: Formerly Chief Operating Officer, 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      No living will   Requests niece, Radford Pax, as health care POA   Would accept resuscitation attempts   No tube feeds if cognitively aware   Social Drivers of Health   Financial Resource Strain: Medium Risk (08/06/2023)   Overall Financial Resource Strain (CARDIA)    Difficulty of Paying Living Expenses: Somewhat hard  Food Insecurity: Food Insecurity Present (08/06/2023)   Hunger Vital Sign    Worried About Running Out of Food in the Last Year: Sometimes true    Ran Out of Food in the Last Year: Often true  Transportation Needs: No Transportation Needs (08/06/2023)   PRAPARE - Scientist, research (physical sciences) (Medical): No    Lack of Transportation (Non-Medical): No  Physical Activity: Unknown (08/06/2023)   Exercise Vital Sign    Days of Exercise per Week: Patient declined    Minutes of Exercise per Session: 10 min  Stress: No Stress Concern Present (08/06/2023)   [employer] of Occupational Health - Occupational Stress Questionnaire    Feeling of Stress : Not at all  Social Connections: Moderately Integrated (08/06/2023)   Social Connection and Isolation Panel [NHANES]    Frequency of Communication with Friends and Family: Twice a week    Frequency of Social Gatherings with Friends and Family: More than three times a week    Attends Religious Services: More than 4 times per year    Active Member of [employer] or Organizations: Yes    Attends Banker Meetings: Never    Marital Status: Widowed  Intimate Partner Violence: Not At Risk (03/13/2022)   Humiliation, Afraid, Rape, and Kick questionnaire    Fear of Current or Ex-Partner: No    Emotionally Abused: No    Physically Abused: No    Sexually Abused: No   Review of Systems Eating her usual Weight is stable for the most time Not sleeping well--in chair    Objective:   Physical Exam Constitutional:      Appearance: Normal appearance.  HENT:     Mouth/Throat:     Pharynx: No oropharyngeal exudate or posterior oropharyngeal erythema.  Cardiovascular:     Rate and Rhythm: Normal rate and regular rhythm.     Heart sounds:     No gallop.     Comments: ?Slight systolic murmur Pulmonary:     Effort: Pulmonary effort is normal.     Breath sounds: Normal breath sounds. No wheezing or rales.  Abdominal:     General: There is no distension.     Palpations: Abdomen is soft.     Tenderness: There is no guarding or rebound.     Comments: Slight  epigastric tenderness  Musculoskeletal:     Cervical back: Neck supple.     Right lower leg: No edema.     Left lower leg: No edema.  Lymphadenopathy:     Cervical: No  cervical adenopathy.  Neurological:     Mental Status: She is alert.            Assessment & Plan:

## 2023-08-08 NOTE — Assessment & Plan Note (Addendum)
 Doesn't appear to be ill Will check CXR but may be related to her dysphagia CXR looks okay--will await overread Try benzonatate for prn

## 2023-08-09 DIAGNOSIS — N186 End stage renal disease: Secondary | ICD-10-CM | POA: Diagnosis not present

## 2023-08-09 DIAGNOSIS — Z992 Dependence on renal dialysis: Secondary | ICD-10-CM | POA: Diagnosis not present

## 2023-08-12 DIAGNOSIS — N186 End stage renal disease: Secondary | ICD-10-CM | POA: Diagnosis not present

## 2023-08-12 DIAGNOSIS — Z992 Dependence on renal dialysis: Secondary | ICD-10-CM | POA: Diagnosis not present

## 2023-08-14 DIAGNOSIS — Z992 Dependence on renal dialysis: Secondary | ICD-10-CM | POA: Diagnosis not present

## 2023-08-14 DIAGNOSIS — N186 End stage renal disease: Secondary | ICD-10-CM | POA: Diagnosis not present

## 2023-08-16 DIAGNOSIS — N186 End stage renal disease: Secondary | ICD-10-CM | POA: Diagnosis not present

## 2023-08-16 DIAGNOSIS — Z992 Dependence on renal dialysis: Secondary | ICD-10-CM | POA: Diagnosis not present

## 2023-08-17 NOTE — Progress Notes (Unsigned)
 Cardiology Clinic Note   Date: 08/20/2023 ID: DENA ESPERANZA, DOB [birth year], MRN [id number]  Primary Cardiologist:  Julien Nordmann, MD  Chief Complaint   Carrie [name] is a 73 y.o. female who presents to the clinic today for routine follow up.   Patient Profile   Carrie [name] is followed by Dr. Mariah Milling for the history outlined below.      Past medical history significant for: Chronic diastolic heart failure. Limited echo 12/07/2021: EF 60 to 65%.  No RWMA.  Mild LVH.  Indeterminate diastolic parameters.  Normal RV size/function.  Small pericardial effusion estimated at 1 cm.  No significant valvular abnormalities. Chest pain. Nuclear stress test 10/24/2021: Low risk, probably normal stress test.  Small in size, mild in severity, reversible defect involving the apical septal and mid anteroseptal myocardium that is most likely artifact (shifting breast attenuation and RV insertion) and less likely ischemia.  Defect is new compared to study February 2020.  No significant coronary artery calcification noted.  There is a fluid density structure adjacent to the right atrium that is not significantly different compared to the prior study.  Also small fluid collection adjacent to the lateral wall of the left ventricle.  Findings are most consistent with loculated pericardial effusion, pericardial cyst felt to be less likely.  Sensitivity and specificity of study degraded by body habitus and extracardiac activity. Hypertension. Hyperlipidemia. T2DM. ESRD. On HD MWF. Brain cancer. Seizures.  In summary, patient was first evaluated by Dr. Mariah Milling on 11/20/2017 to establish care for heart failure and family history of CAD.  Patient reported worsening shortness of breath.  She had recently been started on Lasix by PCP.  Echo May 2019 showed EF 60 to 65%, no RWMA, Grade I DD, mild LVH, mild AS mean gradient 12 mmHg, moderate LAE, normal RV size/function.  She reported tachycardia with exertion.   It was recommended she start Toprol 25 mg daily.  She was instructed to decrease her fluid and sodium intake.  She was seen in February 2020 for preoperative evaluation prior to surgery for vascular access.  She reported persistent DOE.  She underwent nuclear stress testing for risk stratification risk stratification prior to surgery.  It was a low risk study with no EKG changes concerning for ischemia.  She had a dialysis permacath placed on 08/04/2018.  Patient was admitted to the hospital in November 2022 for volume overload.  Troponin mildly elevated with peak of 73.  She was seen in the office in May 2023 and reported chronic DOE with minimal activity such as walking 20 to 30 feet and occasional chest pressure that she had difficulty quantifying.  In the few weeks prior to her visit episodes were lasting approximately 20 minutes.  She underwent nuclear stress testing that was low risk as detailed above.  There was an incidental finding of possible pericardial effusion.  Follow-up echo July 2023 showed a small pericardial effusion felt to be clinically insignificant as detailed above.  Patient had hospital admission July 2024 for hypoxia/acute pulmonary edema.  She reported adherence to dialysis and fluid restrictions.  She was unable to be weaned from supplemental oxygen and was DC'd with home O2.  Patient was last seen in the office by Dr. Mariah Milling on 01/01/2023 for routine visit.  She was not using home O2 at the time of her visit.  She had recently been started on hydralazine 25 mg 3 times a day.  BP borderline low at the time of her visit.  She was instructed to monitor BP at home if continues to be low would consider holding hydralazine.     History of Present Illness    Today, patient reports she is doing well. Patient denies lower extremity edema or PND. She has been sleeping in a recliner for years ever since brain surgery. She reports dyspnea with heavier exertion like walking up an incline or doing  a lot of housework. She has supplemental O2 as needed. Dyspnea will resolve with rest and occasionally she will put on her O2. No chest pain, pressure, or tightness. No palpitations.  She denies hypotension around dialysis. She holds all her medications until after dialysis. Losartan was recently stopped by nephrology secondary to a persistent cough. She is taking tessalon and omeprazole for the cough as well. She has no concerns today.     ROS: All other systems reviewed and are otherwise negative except as noted in History of Present Illness.  EKGs/Labs Reviewed    EKG Interpretation Date/Time:  Tuesday August 20 2023 09:00:07 EDT Ventricular Rate:  70 PR Interval:  190 QRS Duration:  80 QT Interval:  418 QTC Calculation: 451 R Axis:   64  Text Interpretation: Normal sinus rhythm Nonspecific ST and T wave abnormality When compared with ECG of 01-Jan-2023 16:34, No significant change Confirmed by Carlos Levering [id number]) on 08/20/2023 9:08:21 AM   12/31/2022: BUN 33; Creatinine, Ser 7.51; Potassium 3.8; Sodium 134   12/31/2022: Hemoglobin 10.2; WBC 4.7    Physical Exam    VS:  BP 134/80 (BP Location: Right Arm, Patient Position: Sitting, Cuff Size: Normal)   Pulse 70   Ht 5' 2.5" (1.588 m)   Wt 214 lb (97.1 kg)   SpO2 97%   BMI 38.52 kg/m  , BMI Body mass index is 38.52 kg/m.  GEN: Well nourished, well developed, in no acute distress. Neck: No JVD or carotid bruits. Cardiac:  RRR. No murmurs. No rubs or gallops.   Respiratory:  Respirations regular and unlabored. Clear to auscultation without rales, wheezing or rhonchi. GI: Soft, nontender, nondistended. Extremities: Radials/DP/PT 2+ and equal bilaterally. No clubbing or cyanosis. No edema.  Skin: Warm and dry, no rash. Neuro: Strength intact.  Assessment & Plan   Chronic diastolic heart failure/chronic dyspnea Echo July 2024 showed normal LV/RV function, mild LVH, small pericardial effusion estimated at 1 cm, no  significant valvular abnormalities.  Patient reports dyspnea with heavier exertion but none with routine activities. She has slept in a recliner ever since her brain surgery. No PND or lower extremity edema. Losartan was stopped by nephrology secondary to a persistent cough.   Euvolemic and well compensated on exam. -Continue Toprol, hydralazine, torsemide.  Hypertension BP today 134/80. Denies episodes of hypotension around dialysis.  -Continue amlodipine, hydralazine, Toprol.  Hyperlipidemia Patient reports all of her labs are drawn at dialysis. She will make sure the next time she has a lipid panel drawn they send it to [place].  -Check lipid panel with dialysis blood draw and send into office.   ESRD on HD Patient reports adherence to dialysis schedule.  She denies episodes of hypotension around dialysis.  -Continue to hold meds until after dialysis. -Continue to follow with nephrology.  Disposition: Return in 6 months or sooner as needed.          Signed, Etta Grandchild. Feleica Fulmore, DNP, NP-C

## 2023-08-18 ENCOUNTER — Other Ambulatory Visit: Payer: Self-pay | Admitting: Internal Medicine

## 2023-08-18 ENCOUNTER — Other Ambulatory Visit: Payer: Self-pay | Admitting: Nurse Practitioner

## 2023-08-18 DIAGNOSIS — N186 End stage renal disease: Secondary | ICD-10-CM | POA: Diagnosis not present

## 2023-08-18 DIAGNOSIS — Z992 Dependence on renal dialysis: Secondary | ICD-10-CM | POA: Diagnosis not present

## 2023-08-19 DIAGNOSIS — Z992 Dependence on renal dialysis: Secondary | ICD-10-CM | POA: Diagnosis not present

## 2023-08-19 DIAGNOSIS — N186 End stage renal disease: Secondary | ICD-10-CM | POA: Diagnosis not present

## 2023-08-19 NOTE — Telephone Encounter (Signed)
 This is a Educational psychologist pt

## 2023-08-19 NOTE — Telephone Encounter (Signed)
 Last filled 07-18-23 #60 Last OV 08-08-23 Next OV 01-28-24 [facility]

## 2023-08-20 ENCOUNTER — Encounter: Payer: Self-pay | Admitting: Student

## 2023-08-20 ENCOUNTER — Ambulatory Visit: Payer: Medicare HMO | Attending: Student | Admitting: Student

## 2023-08-20 VITALS — BP 134/80 | HR 70 | Ht 62.5 in | Wt 214.0 lb

## 2023-08-20 DIAGNOSIS — N186 End stage renal disease: Secondary | ICD-10-CM

## 2023-08-20 DIAGNOSIS — Z992 Dependence on renal dialysis: Secondary | ICD-10-CM | POA: Diagnosis not present

## 2023-08-20 DIAGNOSIS — E7800 Pure hypercholesterolemia, unspecified: Secondary | ICD-10-CM | POA: Diagnosis not present

## 2023-08-20 DIAGNOSIS — I10 Essential (primary) hypertension: Secondary | ICD-10-CM

## 2023-08-20 DIAGNOSIS — I5032 Chronic diastolic (congestive) heart failure: Secondary | ICD-10-CM

## 2023-08-20 NOTE — Patient Instructions (Signed)
 Medication Instructions:  Your Physician recommend you continue on your current medication as directed.    *If you need a refill on your cardiac medications before your next appointment, please call your pharmacy*   Lab Work: None ordered at this time    Follow-Up: At [facility], you and your health needs are our priority.  As part of our continuing mission to provide you with exceptional heart care, we have created designated Provider Care Teams.  These Care Teams include your primary Cardiologist (physician) and Advanced Practice Providers (APPs -  Physician Assistants and Nurse Practitioners) who all work together to provide you with the care you need, when you need it.   Your next appointment:   6 month(s)  Provider:   You may see Julien Nordmann, MD or Carlos Levering, NP

## 2023-08-21 DIAGNOSIS — N186 End stage renal disease: Secondary | ICD-10-CM | POA: Diagnosis not present

## 2023-08-21 DIAGNOSIS — Z992 Dependence on renal dialysis: Secondary | ICD-10-CM | POA: Diagnosis not present

## 2023-08-23 DIAGNOSIS — Z992 Dependence on renal dialysis: Secondary | ICD-10-CM | POA: Diagnosis not present

## 2023-08-23 DIAGNOSIS — N186 End stage renal disease: Secondary | ICD-10-CM | POA: Diagnosis not present

## 2023-08-26 ENCOUNTER — Encounter: Payer: Self-pay | Admitting: Internal Medicine

## 2023-08-26 DIAGNOSIS — Z992 Dependence on renal dialysis: Secondary | ICD-10-CM | POA: Diagnosis not present

## 2023-08-26 DIAGNOSIS — N186 End stage renal disease: Secondary | ICD-10-CM | POA: Diagnosis not present

## 2023-08-27 ENCOUNTER — Ambulatory Visit: Payer: Self-pay

## 2023-08-27 NOTE — Patient Instructions (Signed)
 Visit Information  Thank you for taking time to visit with me today. Please don't hesitate to contact me if I can be of assistance to you.   Following are the goals we discussed today:   Goals Addressed             This Visit's Progress    Management of chronic health conditions.       Interventions Today    Flowsheet Row Most Recent Value  Chronic Disease   Chronic disease during today's visit Congestive Heart Failure (CHF), Hypertension (HTN), Chronic Kidney Disease/End Stage Renal Disease (ESRD)  General Interventions   General Interventions Discussed/Reviewed General Interventions Reviewed, Doctor Visits, Labs  [place] of current treatment plan for listed health condition and patients adherence to plan as established by provider. Assessed for HF symptoms, BP readings and any new or ongoing symptoms.]  Labs --  [discussed Hgb concerns.  Confirmed patient having blood work done Q2 wks at dialysis with ongoing monitoring of Hgb level.]  Doctor Visits Discussed/Reviewed Doctor Visits Reviewed  [name] upcoming provider visits. Advised to keep follow up visits with providers as recommended.]  Education Interventions   Education Provided Provided Education  [Reviewed HF symptoms/ action plan. Advised to notify provider of increase in symptoms and/ or call 911 for severe HF/ breathing symptoms. Advised to continue self monitoring for weight, swelling, increase SOB symptoms. Advised to monitor blood pressure.]  Provided Verbal Education On Nutrition  [Continue following low salt diet and adhere to fluid restriction.]  Mental Health Interventions   Mental Health Discussed/Reviewed Grief and Loss  [active listening and support.]  Pharmacy Interventions   Pharmacy Dicussed/Reviewed Pharmacy Topics Reviewed  [medications reviewed and adherence of medications discussed and advised. Discussed recent medication adjustments. Assessed ongoing cough. Assessed use of tessalon pearls treatment and  effects on cough.]              Our next appointment is by telephone on 09/24/23 at 11 am  Please call the care guide team at [phone] if you need to cancel or reschedule your appointment.   If you are experiencing a Mental Health or Behavioral Health Crisis or need someone to talk to, please call the Suicide and Crisis Lifeline: 988 call 1-800-273-TALK (toll free, 24 hour hotline)  Patient verbalizes understanding of instructions and care plan provided today and agrees to view in MyChart. Active MyChart status and patient understanding of how to access instructions and care plan via MyChart confirmed with patient.     George Ina RN, BSN, CCM [employer], Population Health Case Manager Phone: [phone]

## 2023-08-27 NOTE — Patient Outreach (Signed)
 Care Coordination   Follow Up Visit Note   08/27/2023 Name: Carrie [name] MRN: [id number] DOB: Feb 16, 1951  Carrie Kitchen [name] is a 73 y.o. year old female who sees Karie Schwalbe, MD for primary care. I spoke with  Carrie Kitchen [name] by phone today.  What matters to the patients health and wellness today?  Patient states recently her Hemoglobin has been dropping. She states her blood work is being monitored every 2 weeks at dialysis. She reports her last Hgb result was 9.4.  Patient states her losartan was discontinued due to this.  Patient reports felling a little fatigued today. She states she didn't sleep well last night.  Patient states she continues to take the prescribed tessalon pearls and increase dose of  omeprazole.  She states she feels this has helped her cough and the ability to take her medications easier.  Patient states her doctor still want to refer her to a gastroenterologist.  Patient states her blood pressures have been relatively stable for her ranging 130-160's/80's.  Patient reports her friend from dialysis passed away. She states he sat across from her at dialysis. She states she was able to go to his funeral to support the family.  Patient states she will be going to the beach with her family 09/13/23/- 09/17/23. She states she is excited about this.  She states her plan is to have dialysis treatment the day prior to leaving and will have dialysis the day after she returns.    Goals Addressed             This Visit's Progress    Management of chronic health conditions.       Interventions Today    Flowsheet Row Most Recent Value  Chronic Disease   Chronic disease during today's visit Congestive Heart Failure (CHF), Hypertension (HTN), Chronic Kidney Disease/End Stage Renal Disease (ESRD)  General Interventions   General Interventions Discussed/Reviewed General Interventions Reviewed, Doctor Visits, Labs  [place] of current treatment plan for listed health condition  and patients adherence to plan as established by provider. Assessed for HF symptoms, BP readings and any new or ongoing symptoms.]  Labs --  [discussed Hgb concerns.  Confirmed patient having blood work done Q2 wks at dialysis with ongoing monitoring of Hgb level.]  Doctor Visits Discussed/Reviewed Doctor Visits Reviewed  [name] upcoming provider visits. Advised to keep follow up visits with providers as recommended.]  Education Interventions   Education Provided Provided Education  [Reviewed HF symptoms/ action plan. Advised to notify provider of increase in symptoms and/ or call 911 for severe HF/ breathing symptoms. Advised to continue self monitoring for weight, swelling, increase SOB symptoms. Advised to monitor blood pressure.]  Provided Verbal Education On Nutrition  [Continue following low salt diet and adhere to fluid restriction.]  Mental Health Interventions   Mental Health Discussed/Reviewed Grief and Loss  [active listening and support.]  Pharmacy Interventions   Pharmacy Dicussed/Reviewed Pharmacy Topics Reviewed  [medications reviewed and adherence of medications discussed and advised. Discussed recent medication adjustments. Assessed ongoing cough. Assessed use of tessalon pearls treatment and effects on cough.]              SDOH assessments and interventions completed:  No     Care Coordination Interventions:  Yes, provided   Follow up plan: Follow up call scheduled for 09/24/23 at 11 am    Encounter Outcome:  Patient Visit Completed   George Ina RN, BSN, CCM [place]  [facility], Population  Health Case Manager Phone: [phone]

## 2023-08-28 DIAGNOSIS — N186 End stage renal disease: Secondary | ICD-10-CM | POA: Diagnosis not present

## 2023-08-28 DIAGNOSIS — Z992 Dependence on renal dialysis: Secondary | ICD-10-CM | POA: Diagnosis not present

## 2023-08-30 DIAGNOSIS — Z992 Dependence on renal dialysis: Secondary | ICD-10-CM | POA: Diagnosis not present

## 2023-08-30 DIAGNOSIS — N186 End stage renal disease: Secondary | ICD-10-CM | POA: Diagnosis not present

## 2023-09-02 DIAGNOSIS — Z992 Dependence on renal dialysis: Secondary | ICD-10-CM | POA: Diagnosis not present

## 2023-09-02 DIAGNOSIS — N186 End stage renal disease: Secondary | ICD-10-CM | POA: Diagnosis not present

## 2023-09-03 DIAGNOSIS — N186 End stage renal disease: Secondary | ICD-10-CM | POA: Diagnosis not present

## 2023-09-03 DIAGNOSIS — Z992 Dependence on renal dialysis: Secondary | ICD-10-CM | POA: Diagnosis not present

## 2023-09-04 DIAGNOSIS — Z992 Dependence on renal dialysis: Secondary | ICD-10-CM | POA: Diagnosis not present

## 2023-09-04 DIAGNOSIS — N186 End stage renal disease: Secondary | ICD-10-CM | POA: Diagnosis not present

## 2023-09-05 ENCOUNTER — Other Ambulatory Visit: Payer: Self-pay | Admitting: Internal Medicine

## 2023-09-05 DIAGNOSIS — Z1231 Encounter for screening mammogram for malignant neoplasm of breast: Secondary | ICD-10-CM

## 2023-09-06 ENCOUNTER — Telehealth: Payer: Self-pay

## 2023-09-06 DIAGNOSIS — N186 End stage renal disease: Secondary | ICD-10-CM | POA: Diagnosis not present

## 2023-09-06 DIAGNOSIS — Z992 Dependence on renal dialysis: Secondary | ICD-10-CM | POA: Diagnosis not present

## 2023-09-06 NOTE — Telephone Encounter (Signed)
 Spoke to pt. She was wanting a portable concentrator to take to the beach next week. She said she will call back MOnday if she decides to schedule an OV to discuss it.

## 2023-09-06 NOTE — Telephone Encounter (Signed)
 Copied from CRM [id number]. Topic: General - Other >> Sep 06, 2023 10:29 AM [place] A wrote: Reason for CRM: Patient would like portable Oxygen tank uses [employer] [facility]

## 2023-09-09 DIAGNOSIS — N186 End stage renal disease: Secondary | ICD-10-CM | POA: Diagnosis not present

## 2023-09-09 DIAGNOSIS — Z992 Dependence on renal dialysis: Secondary | ICD-10-CM | POA: Diagnosis not present

## 2023-09-11 DIAGNOSIS — N186 End stage renal disease: Secondary | ICD-10-CM | POA: Diagnosis not present

## 2023-09-11 DIAGNOSIS — Z992 Dependence on renal dialysis: Secondary | ICD-10-CM | POA: Diagnosis not present

## 2023-09-13 DIAGNOSIS — Z992 Dependence on renal dialysis: Secondary | ICD-10-CM | POA: Diagnosis not present

## 2023-09-13 DIAGNOSIS — N186 End stage renal disease: Secondary | ICD-10-CM | POA: Diagnosis not present

## 2023-09-18 DIAGNOSIS — Z992 Dependence on renal dialysis: Secondary | ICD-10-CM | POA: Diagnosis not present

## 2023-09-18 DIAGNOSIS — N186 End stage renal disease: Secondary | ICD-10-CM | POA: Diagnosis not present

## 2023-09-20 DIAGNOSIS — Z992 Dependence on renal dialysis: Secondary | ICD-10-CM | POA: Diagnosis not present

## 2023-09-20 DIAGNOSIS — N186 End stage renal disease: Secondary | ICD-10-CM | POA: Diagnosis not present

## 2023-09-23 ENCOUNTER — Other Ambulatory Visit: Payer: Self-pay | Admitting: Internal Medicine

## 2023-09-23 DIAGNOSIS — Z992 Dependence on renal dialysis: Secondary | ICD-10-CM | POA: Diagnosis not present

## 2023-09-23 DIAGNOSIS — N186 End stage renal disease: Secondary | ICD-10-CM | POA: Diagnosis not present

## 2023-09-23 NOTE — Telephone Encounter (Signed)
 Last filled 08-19-23 #60 Last OV 08-08-23 Next OV 01-28-24 [facility]

## 2023-09-24 ENCOUNTER — Ambulatory Visit: Payer: Self-pay

## 2023-09-24 ENCOUNTER — Other Ambulatory Visit: Payer: Self-pay

## 2023-09-24 NOTE — Patient Outreach (Signed)
 Complex Care Management   Visit Note  09/24/2023  Name:  Carrie [name] MRN: [id number] DOB: [birth year]  Situation: Referral received for Complex Care Management related to Heart Failure and HTN  I obtained verbal consent from Patient.  Visit completed with patient  on the phone.  Patient states overall she is doing very well. She states she was told by her provider at dialysis that her Hgb is back within normal range.   Background:   Past Medical History:  Diagnosis Date   Allergy    Anemia    Arthritis    "right knee" (10/26'/2017)   Chronic heart failure with preserved ejection fraction (HFpEF) (HCC)    a. 04/2021 Echo: EF >55%, GrI DD, nl RV fxn, triv MR; b. 12/2021 Echo: EF 60-65%, no rwma.   Demand ischemia (HCC)    a. 04/2021 HsTrop up to 73 in setting of volume overload; b. 10/2021 MV: EF 66% with small, mild reversible defect in the apical septal and mid anteroseptal myocardium which was felt to most likely represent artifact.   Depression    ESRD (end stage renal disease) (HCC)    a. OnHD (Dr. Gari Junior)   History of blood transfusion 2008   "when I had brain tumor surgery"   History of MRSA infection 2008   History of stress test    a. 07/2018 MV: EF 66%, no ischemia/infarct.   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 12/2021 Echo: EF 60-65%, no rwma, mild LVH, nl RV fxn, small pericardial effusion - small pocket of post wall, ~ 1cm.   Precordial chest pain    a. 10/2021 MV: low risk.   Type II diabetes mellitus (HCC) 2011   on metformin  until yr ago- no med now - off due to kidneys    Assessment: Patient Reported Symptoms:  Cognitive Cognitive Status: Alert and oriented to person, place, and time, Insightful and able to interpret abstract concepts Cognitive/Intellectual Conditions Management [RPT]:  (none per patient)   Health Maintenance Behaviors: Annual physical exam, Spiritual practice(s), Social activities,  Immunizations, Sleep adequate Healing Pattern: Average Health Facilitated by: Prayer/meditation, Rest, Healthy diet  Neurological Neurological Review of Symptoms: No symptoms reported Neurological Conditions: Seizures (patient states under control with medications.) Neurological Management Strategies: Routine screening, Medication therapy Neurological Self-Management Outcome: 4 (good)  HEENT HEENT Symptoms Reported: Runny nose, Frequent sneezing, Nasal discharge HEENT Conditions:  (patient report having seasonal allergies.) HEENT Management Strategies: Routine screening, Medication therapy HEENT Self-Management Outcome: 4 (good)  (patient report having seasonal allergies.)  Cardiovascular Cardiovascular Symptoms Reported: No symptoms reported Does patient have uncontrolled Hypertension?: No Cardiovascular Conditions: Heart failure, Hypertension Cardiovascular Management Strategies: Routine screening, Medication therapy, Fluid modification, Diet modification Cardiovascular Self-Management Outcome: 4 (good)  Respiratory Respiratory Symptoms Reported: Dry cough Respiratory Conditions: Seasonal allergies Respiratory Self-Management Outcome: 4 (good)  Endocrine Patient reports the following symptoms related to hypoglycemia or hyperglycemia : No symptoms reported Is patient diabetic?: No    Gastrointestinal Gastrointestinal Symptoms Reported: Change in appetite Gastrointestinal Conditions:  (none per patient) Gastrointestinal Management Strategies: Diet modification Gastrointestinal Self-Management Outcome: 4 (good) Nutrition Risk Screen (CP): No indicators present  Genitourinary Genitourinary Symptoms Reported: No symptoms reported Genitourinary Conditions: End-stage renal disease Genitourinary Management Strategies: Hemodialysis Hemodialysis Schedule: patient on dialysis M, W, F Hemodialysis Last Treatment: 09/23/23 Genitourinary Self-Management Outcome: 4 (good)  Integumentary  Integumentary Symptoms Reported: No symptoms reported    Musculoskeletal Musculoskelatal Symptoms Reviewed:  No symptoms reported        Psychosocial Psychosocial Symptoms Reported: No symptoms reported     Quality of Family Relationships: supportive Do you feel physically threatened by others?: No      02/26/2023    9:32 AM  Depression screen PHQ 2/9  Decreased Interest 0  Down, Depressed, Hopeless 0  PHQ - 2 Score 0    There were no vitals filed for this visit.  Medications Reviewed Today     Reviewed by Marshal Schrecengost E, RN (Registered Nurse) on 09/24/23 at 1136  Med List Status: <None>   Medication Order Taking? Sig Documenting Provider Last Dose Status Informant  acetaminophen  (TYLENOL ) 500 MG tablet [id number] Yes Take 1,000 mg by mouth every 6 (six) hours as needed for mild pain or moderate pain. [provider] Taking Active Self  albuterol  (VENTOLIN  HFA) 108 (90 Base) MCG/ACT inhaler [id number] Yes Inhale 2 puffs into the lungs every 6 (six) hours as needed for wheezing or shortness of breath. Montey Apa, DO Taking Active Self  amLODipine  (NORVASC ) 10 MG tablet [id number] Yes Take 10 mg by mouth daily. [provider] Taking Active Self  atorvastatin  (LIPITOR) 40 MG tablet [id number] Yes TAKE 40 MG BY MOUTH ONCE DAILY  Patient taking differently: Take 40 mg by mouth daily.   Helaine Llanos, MD Taking Active Self  benzonatate  (TESSALON ) 200 MG capsule [id number] Yes Take 1 capsule (200 mg total) by mouth 3 (three) times daily as needed for cough. Helaine Llanos, MD Taking Active   hydrALAZINE  (APRESOLINE ) 25 MG tablet [id number] Yes TAKE 1 TABLET BY MOUTH THREE TIMES A DAY Helaine Llanos, MD Taking Active   levETIRAcetam  (KEPPRA ) 500 MG tablet [id number] Yes Take 1 tablet (500 mg total) by mouth 2 (two) times daily. Helaine Llanos, MD Taking Active   lidocaine -prilocaine  (EMLA ) cream [id number] Yes Apply 1 application topically every Monday,  Wednesday, and Friday with hemodialysis. [provider] Taking Active Self  LORazepam  (ATIVAN ) 0.5 MG tablet [id number] Yes TAKE 1 TABLET BY MOUTH TWICE A DAY AS NEEDED FOR ANXIETY Letvak, Richard I, MD Taking Active   metoprolol  succinate (TOPROL -XL) 50 MG 24 hr tablet [id number] Yes Take 50 mg by mouth daily. [provider] Taking Active Self  multivitamin (RENA-VIT) TABS tablet [id number] Yes Take 1 tablet by mouth at bedtime. Verla Glaze, MD Taking Active Self  omeprazole  (PRILOSEC) 20 MG capsule [id number] Yes Take 1 capsule (20 mg total) by mouth in the morning and at bedtime. Helaine Llanos, MD Taking Active   ondansetron  (ZOFRAN -ODT) 4 MG disintegrating tablet [id number] Yes Take 4 mg by mouth every 8 (eight) hours as needed for nausea or vomiting. [provider] Taking Active Self  sevelamer  carbonate (RENVELA ) 800 MG tablet [id number] Yes Take 800-1,600 mg by mouth See admin instructions. Take 2 tablets (1600 mg) by mouth three times daily with meals and take 1 tablet (800 mg) by mouth twice daily with snacks [provider] Taking Active Self  torsemide  (DEMADEX ) 20 MG tablet [id number] Yes Take 20 mg by mouth daily. Every day (including on dialysis days) [provider] Taking Active Self            Recommendation:   PCP Follow-up  Follow Up Plan:   Telephone follow-up in 1 month with RN case manager.   Verba Girt RN, BSN, CCM [employer], Population Health Case Manager Phone: [phone]

## 2023-09-24 NOTE — Patient Instructions (Signed)
 Visit Information  Thank you for taking time to visit with me today. Please don't hesitate to contact me if I can be of assistance to you before our next scheduled appointment.  Our next appointment is by telephone on 11/05/23 at 11 am Please call the care guide team at [phone] if you need to cancel or reschedule your appointment.   Following is a copy of your care plan:   Goals Addressed             This Visit's Progress    COMPLETED: Management of chronic health conditions.       Interventions Today    Flowsheet Row Most Recent Value  Chronic Disease   Chronic disease during today's visit Congestive Heart Failure (CHF), Hypertension (HTN), Chronic Kidney Disease/End Stage Renal Disease (ESRD)  General Interventions   General Interventions Discussed/Reviewed General Interventions Reviewed, Doctor Visits, Labs  [place] of current treatment plan for listed health condition and patients adherence to plan as established by provider. Assessed for HF symptoms, BP readings and any new or ongoing symptoms.]  Labs --  [discussed Hgb concerns.  Confirmed patient having blood work done Q2 wks at dialysis with ongoing monitoring of Hgb level.]  Doctor Visits Discussed/Reviewed Doctor Visits Reviewed  Carin [place] upcoming provider visits. Advised to keep follow up visits with providers as recommended.]  Education Interventions   Education Provided Provided Education  [Reviewed HF symptoms/ action plan. Advised to notify provider of increase in symptoms and/ or call 911 for severe HF/ breathing symptoms. Advised to continue self monitoring for weight, swelling, increase SOB symptoms. Advised to monitor blood pressure.]  Provided Verbal Education On Nutrition  [Continue following low salt diet and adhere to fluid restriction.]  Mental Health Interventions   Mental Health Discussed/Reviewed Grief and Loss  [active listening and support.]  Pharmacy Interventions   Pharmacy Dicussed/Reviewed  Pharmacy Topics Reviewed  [medications reviewed and adherence of medications discussed and advised. Discussed recent medication adjustments. Assessed ongoing cough. Assessed use of tessalon  pearls treatment and effects on cough.]           VBCI RN Care Plan- HF       Problems:  Chronic Disease Management support and education needs related to CHF  Goal: Over the next 3 months the Patient will attend all scheduled medical appointments: with providers as evidenced by patient report/ chart review.         continue to work with Medical illustrator and/or Social Worker to address care management and care coordination needs related to CHF as evidenced by adherence to care management team scheduled appointments     demonstrate a decrease CHF in exacerbations as evidenced by patient report/ chart review.  take all medications exactly as prescribed and will call provider for medication related questions as evidenced by patient report/ chart review.     verbalize understanding of plan for management of CHF as evidenced by patient report/ chart review.  Discussed most recent Hgb results.   Interventions:   Heart Failure Interventions: Provided education on low sodium diet Reviewed Heart Failure Action Plan in depth and provided written copy Advised patient to weigh each morning after emptying bladder Discussed the importance of keeping all appointments with provider Assessed social determinant of health barriers  Advised to call provider for increase in heart failure symptoms.  Mailed patient Advance Directive as requested.   Patient Self-Care Activities:  Attend all scheduled provider appointments Call pharmacy for medication refills 3-7 days in advance of running out of  medications Call provider office for new concerns or questions  Take medications as prescribed   call office if I gain more than 2 pounds in one day or 5 pounds in one week use salt in moderation watch for swelling in feet,  ankles and legs every day weigh myself daily know when to call the doctor:for increase weight gain of 2 lbs overnight or 5 lbs in a week, increase SOB, swelling in feet, ankles, legs, hand, abdomen.   Plan:  Telephone follow up appointment with care management team member scheduled for:  11/05/23 at 11 am          VBCI RN Care Plan-HTN       Problems:  Chronic Disease Management support and education needs related to HTN  Goal: Over the next 2months the Patient will attend all scheduled medical appointments: with providers  as evidenced by patient report / chart review.          continue to work with Medical illustrator and/or Social Worker to address care management and care coordination needs related to HTN as evidenced by adherence to care management team scheduled appointments     demonstrate Ongoing adherence to prescribed treatment plan for HTN as evidenced by patient report/ chart review.  demonstrate understanding of rationale for each prescribed medication as evidenced by patient Report/ chart review.     take all medications exactly as prescribed and will call provider for medication related questions as evidenced by patient report/ chart review.     verbalize understanding of plan for management of HTN as evidenced by patient report/ chart review.  Patient will home monitor blood pressure at least 2 x per week and record.  Patient will continue to adhere to low salt diet.   Interventions:   Hypertension Interventions: Last practice recorded BP readings:  BP Readings from Last 3 Encounters:  08/20/23 134/80  08/08/23 138/72  04/02/23 (!) 159/81   Most recent eGFR/CrCl: No results found for: "EGFR"  No components found for: "CRCL"  Evaluation of current treatment plan related to hypertension self management and patient's adherence to plan as established by provider Reviewed medications with patient and discussed importance of compliance Discussed plans with patient for  ongoing care management follow up and provided patient with direct contact information for care management team Reviewed scheduled/upcoming provider appointments including:  Advised patient to monitor blood pressure at least 2 days per week and findings outside of normal parameters to provider.  Advised to adhere to a low salt diet.   Patient Self-Care Activities:  Attend all scheduled provider appointments Call pharmacy for medication refills 3-7 days in advance of running out of medications Call provider office for new concerns or questions  Take medications as prescribed   check blood pressure weekly keep a blood pressure log take blood pressure log to all doctor appointments call doctor for signs and symptoms of high blood pressure keep all doctor appointments take medications for blood pressure exactly as prescribed Follow a low salt diet.   Plan:  Telephone follow up appointment with care management team member scheduled for:  11/05/23 at 11 am.   Gaspar Fowle RN, BSN, CCM   [facility], Population Health Case Manager Phone: [phone]              Please call the Suicide and Crisis Lifeline: 988 call 1-800-273-TALK (toll free, 24 hour hotline) if you are experiencing a Mental Health or Behavioral Health Crisis or need someone to talk to.  Patient verbalizes understanding of instructions and care plan provided today and agrees to view in MyChart. Active MyChart status and patient understanding of how to access instructions and care plan via MyChart confirmed with patient.     Verba Girt RN, BSN, CCM [employer], Population Health Case Manager Phone: [phone]

## 2023-09-25 DIAGNOSIS — Z992 Dependence on renal dialysis: Secondary | ICD-10-CM | POA: Diagnosis not present

## 2023-09-25 DIAGNOSIS — N186 End stage renal disease: Secondary | ICD-10-CM | POA: Diagnosis not present

## 2023-09-27 DIAGNOSIS — Z992 Dependence on renal dialysis: Secondary | ICD-10-CM | POA: Diagnosis not present

## 2023-09-27 DIAGNOSIS — N186 End stage renal disease: Secondary | ICD-10-CM | POA: Diagnosis not present

## 2023-09-30 DIAGNOSIS — Z992 Dependence on renal dialysis: Secondary | ICD-10-CM | POA: Diagnosis not present

## 2023-09-30 DIAGNOSIS — N186 End stage renal disease: Secondary | ICD-10-CM | POA: Diagnosis not present

## 2023-10-01 ENCOUNTER — Encounter (INDEPENDENT_AMBULATORY_CARE_PROVIDER_SITE_OTHER): Payer: Self-pay | Admitting: Vascular Surgery

## 2023-10-01 ENCOUNTER — Ambulatory Visit (INDEPENDENT_AMBULATORY_CARE_PROVIDER_SITE_OTHER): Payer: Medicare HMO | Admitting: Vascular Surgery

## 2023-10-01 ENCOUNTER — Ambulatory Visit (INDEPENDENT_AMBULATORY_CARE_PROVIDER_SITE_OTHER): Payer: Medicare HMO

## 2023-10-01 VITALS — BP 174/78 | HR 61 | Resp 18 | Ht 62.0 in | Wt 214.0 lb

## 2023-10-01 DIAGNOSIS — I10 Essential (primary) hypertension: Secondary | ICD-10-CM | POA: Diagnosis not present

## 2023-10-01 DIAGNOSIS — Z992 Dependence on renal dialysis: Secondary | ICD-10-CM

## 2023-10-01 DIAGNOSIS — N186 End stage renal disease: Secondary | ICD-10-CM | POA: Diagnosis not present

## 2023-10-01 DIAGNOSIS — E785 Hyperlipidemia, unspecified: Secondary | ICD-10-CM

## 2023-10-01 NOTE — Assessment & Plan Note (Signed)
 Duplex today shows significantly elevated velocities in the proximal and mid portions of the graft consistent with a hemodynamically significant stenosis.  This finding associated with worsening problems with use of the graft, I would recommend a shuntogram with possible intervention.  Risks and benefits were discussed and she is agreeable to proceed.  We discussed that without intervention, loss of the graft is certainly a possibility.

## 2023-10-01 NOTE — H&P (View-Only) (Signed)
 MRN : [id number]  Carrie [name] is a 73 y.o. (30-Oct-1950) female who presents with chief complaint of  Chief Complaint  Patient presents with   Follow-up    6 month HDA  .  History of Present Illness: Patient returns today in follow up of her dialysis access.  She has been using this left upper arm AV graft now for several years.  Recently, they have been having a more difficult time accessing the graft and getting good flows.  She denies any fever or chills.  No signs of infection.  She has had multiple previous interventions on this graft although it has been present for years.  Duplex today shows significantly elevated velocities in the proximal and mid portions of the graft consistent with a hemodynamically significant stenosis.  Current Outpatient Medications  Medication Sig Dispense Refill   acetaminophen  (TYLENOL ) 500 MG tablet Take 1,000 mg by mouth every 6 (six) hours as needed for mild pain or moderate pain.     albuterol  (VENTOLIN  HFA) 108 (90 Base) MCG/ACT inhaler Inhale 2 puffs into the lungs every 6 (six) hours as needed for wheezing or shortness of breath. 8 g 2   amLODipine  (NORVASC ) 10 MG tablet Take 10 mg by mouth daily.     atorvastatin  (LIPITOR) 40 MG tablet TAKE 40 MG BY MOUTH ONCE DAILY (Patient taking differently: Take 40 mg by mouth daily.) 90 tablet 3   benzonatate  (TESSALON ) 200 MG capsule Take 1 capsule (200 mg total) by mouth 3 (three) times daily as needed for cough. 60 capsule 0   hydrALAZINE  (APRESOLINE ) 25 MG tablet TAKE 1 TABLET BY MOUTH THREE TIMES A DAY 270 tablet 1   levETIRAcetam  (KEPPRA ) 500 MG tablet Take 1 tablet (500 mg total) by mouth 2 (two) times daily. 180 tablet 3   lidocaine -prilocaine  (EMLA ) cream Apply 1 application topically every Monday, Wednesday, and Friday with hemodialysis.     LORazepam  (ATIVAN ) 0.5 MG tablet TAKE 1 TABLET BY MOUTH TWICE A DAY AS NEEDED FOR ANXIETY 60 tablet 0   metoprolol  succinate (TOPROL -XL) 50 MG 24 hr tablet  Take 50 mg by mouth daily.     multivitamin (RENA-VIT) TABS tablet Take 1 tablet by mouth at bedtime. 30 tablet 0   omeprazole  (PRILOSEC) 20 MG capsule Take 1 capsule (20 mg total) by mouth in the morning and at bedtime. 180 capsule 3   ondansetron  (ZOFRAN -ODT) 4 MG disintegrating tablet Take 4 mg by mouth every 8 (eight) hours as needed for nausea or vomiting.     sevelamer  carbonate (RENVELA ) 800 MG tablet Take 800-1,600 mg by mouth See admin instructions. Take 2 tablets (1600 mg) by mouth three times daily with meals and take 1 tablet (800 mg) by mouth twice daily with snacks     torsemide  (DEMADEX ) 20 MG tablet Take 20 mg by mouth daily. Every day (including on dialysis days)     No current facility-administered medications for this visit.    Past Medical History:  Diagnosis Date   Allergy    Anemia    Arthritis    "right knee" (10/26'/2017)   Chronic heart failure with preserved ejection fraction (HFpEF) (HCC)    a. 04/2021 Echo: EF >55%, GrI DD, nl RV fxn, triv MR; b. 12/2021 Echo: EF 60-65%, no rwma.   Demand ischemia (HCC)    a. 04/2021 HsTrop up to 73 in setting of volume overload; b. 10/2021 MV: EF 66% with small, mild reversible defect in the apical septal and  mid anteroseptal myocardium which was felt to most likely represent artifact.   Depression    ESRD (end stage renal disease) (HCC)    a. On[facility] (Dr. Gari Junior)   History of blood transfusion 2008   "when I had brain tumor surgery"   History of MRSA infection 2008   History of stress test    a. 07/2018 MV: EF 66%, no ischemia/infarct.   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 12/2021 Echo: EF 60-65%, no rwma, mild LVH, nl RV fxn, small pericardial effusion - small pocket of post wall, ~ 1cm.   Precordial chest pain    a. 10/2021 MV: low risk.   Type II diabetes mellitus (HCC) 2011   on metformin  until yr ago- no med now - off due to kidneys    Past Surgical History:   Procedure Laterality Date   A/V FISTULAGRAM Left 03/10/2019   Procedure: A/V FISTULAGRAM;  Surgeon: Jackquelyn Mass, MD;  Location: [facility];  Service: Cardiovascular;  Laterality: Left;   A/V FISTULAGRAM Left 07/14/2019   Procedure: A/V FISTULAGRAM;  Surgeon: Jackquelyn Mass, MD;  Location: [facility];  Service: Cardiovascular;  Laterality: Left;   A/V FISTULAGRAM Left 01/07/2020   Procedure: A/V FISTULAGRAM;  Surgeon: Jackquelyn Mass, MD;  Location: [facility];  Service: Cardiovascular;  Laterality: Left;   A/V FISTULAGRAM Left 11/14/2021   Procedure: A/V Fistulagram;  Surgeon: Jackquelyn Mass, MD;  Location: [facility];  Service: Cardiovascular;  Laterality: Left;   AV FISTULA PLACEMENT Left 11/07/2018   Procedure: ARTERIOVENOUS (AV) FISTULA CREATION ( BRACHIO BASILIC ) VS BRACHIAL AXILLARY GRAFT;  Surgeon: Jackquelyn Mass, MD;  Location: [facility];  Service: Vascular;  Laterality: Left;   BRAIN MENINGIOMA EXCISION  08/2006   Foramina magnum meningioma   CERVICAL DISCECTOMY  1996   Dr. Wallace Gully   CESAREAN SECTION  1981   COLONOSCOPY     COLONOSCOPY WITH PROPOFOL  N/A 03/07/2021   Procedure: COLONOSCOPY WITH PROPOFOL ;  Surgeon: Irby Mannan, MD;  Location: [facility];  Service: Endoscopy;  Laterality: N/A;   DIALYSIS/PERMA CATHETER INSERTION N/A 08/04/2018   Procedure: DIALYSIS/PERMA CATHETER INSERTION;  Surgeon: Celso College, MD;  Location: [facility];  Service: Cardiovascular;  Laterality: N/A;   DIALYSIS/PERMA CATHETER REMOVAL N/A 12/25/2018   Procedure: DIALYSIS/PERMA CATHETER REMOVAL;  Surgeon: Celso College, MD;  Location: [facility];  Service: Cardiovascular;  Laterality: N/A;   DILATION AND CURETTAGE OF UTERUS     JOINT REPLACEMENT     right knee left hip   MULTIPLE TOOTH EXTRACTIONS  01/30/2023   Right wrist x-ray  2007   Deg. at 1st MCP   TOTAL HIP ARTHROPLASTY Left 03/27/2016   Procedure:  LEFT TOTAL HIP ARTHROPLASTY ANTERIOR APPROACH;  Surgeon: Arnie Lao, MD;  Location[facility];  Service: Orthopedics;  Laterality: Left;   TOTAL KNEE ARTHROPLASTY Right 04/30/2017   Procedure: RIGHT TOTAL KNEE ARTHROPLASTY;  Surgeon: Arnie Lao, MD;  Location: [facility] OR;  Service: Orthopedics;  Laterality: Right;   TOTAL KNEE ARTHROPLASTY Left 03/13/2022   Procedure: LEFT TOTAL KNEE ARTHROPLASTY;  Surgeon: Arnie Lao, MD;  Location: [facility] OR;  Service: Orthopedics;  Laterality: Left;   TUBAL LIGATION  1981     Social History   Tobacco Use   Smoking status: Never    Passive exposure: Past  Smokeless tobacco: Never  Vaping Use   Vaping status: Never Used  Substance Use Topics   Alcohol use: No    Alcohol/week: 0.0 standard drinks of alcohol   Drug use: No      Family History  Problem Relation Age of Onset   Hypertension Other        Strong family history    Breast cancer Mother 80   Heart failure Sister    Heart disease Sister      Allergies  Allergen Reactions   Naproxen Sodium Anaphylaxis and Swelling   Sulfamethoxazole-Trimethoprim Anaphylaxis and Swelling     . REVIEW OF SYSTEMS (Negative unless checked)   Constitutional: [] Weight loss  [] Fever  [] Chills Cardiac: [] Chest pain   [] Chest pressure   [] Palpitations   [] Shortness of breath when laying flat   [] Shortness of breath at rest   [x] Shortness of breath with exertion. Vascular:  [] Pain in legs with walking   [] Pain in legs at rest   [] Pain in legs when laying flat   [] Claudication   [] Pain in feet when walking  [] Pain in feet at rest  [] Pain in feet when laying flat   [] History of DVT   [] Phlebitis   [x] Swelling in legs   [] Varicose veins   [] Non-healing ulcers Pulmonary:   [] Uses home oxygen   [] Productive cough   [] Hemoptysis   [] Wheeze  [] COPD   [] Asthma Neurologic:  [] Dizziness  [] Blackouts   [] Seizures   [] History of stroke   [] History of TIA  [] Aphasia   [] Temporary blindness    [] Dysphagia   [] Weakness or numbness in arms   [] Weakness or numbness in legs Musculoskeletal:  [x] Arthritis   [] Joint swelling   [x] Joint pain   [] Low back pain Hematologic:  [] Easy bruising  [] Easy bleeding   [] Hypercoagulable state   [x] Anemic  [] Hepatitis Gastrointestinal:  [] Blood in stool   [] Vomiting blood  [] Gastroesophageal reflux/heartburn   [] Abdominal pain Genitourinary:  [x] Chronic kidney disease   [] Difficult urination  [] Frequent urination  [] Burning with urination   [] Hematuria Skin:  [] Rashes   [] Ulcers   [] Wounds Psychological:  [] History of anxiety   []  History of major depression.  Physical Examination  BP (!) 174/78   Pulse 61   Resp 18   Ht 5\' 2"  (1.575 m)   Wt 214 lb (97.1 kg)   BMI 39.14 kg/m  Gen:  WD/WN, NAD Head: [place]/AT, No temporalis wasting. Ear/Nose/Throat: Hearing grossly intact, nares w/o erythema or drainage Eyes: Conjunctiva clear. Sclera non-icteric Neck: Supple.  Trachea midline Pulmonary:  Good air movement, no use of accessory muscles.  Cardiac: RRR, no JVD Vascular: left arm AVG with weak thrill in the proximal upper arm and more pulsatile near the elbow Vessel Right Left  Radial Palpable Palpable                       Musculoskeletal: M/S 5/5 throughout.  No deformity or atrophy. Mild LE edema. Neurologic: Sensation grossly intact in extremities.  Symmetrical.  Speech is fluent.  Psychiatric: Judgment intact, Mood & affect appropriate for pt's clinical situation. Dermatologic: No rashes or ulcers noted.  No cellulitis or open wounds.      Labs No results found for this or any previous visit (from the past 2160 hours).  Radiology No results found.  Assessment/Plan  ESRD on hemodialysis (HCC) Duplex today shows significantly elevated velocities in the proximal and mid portions of the graft consistent with a hemodynamically significant stenosis.  This finding  associated with worsening problems with use of the graft, I would  recommend a shuntogram with possible intervention.  Risks and benefits were discussed and she is agreeable to proceed.  We discussed that without intervention, loss of the graft is certainly a possibility.  Essential hypertension An underlying cause of ESRD and blood pressure control important in reducing the progression of atherosclerotic disease. On appropriate oral medications.     Hyperlipidemia lipid control important in reducing the progression of atherosclerotic disease. Continue statin therapy  Mikki Alexander, MD  10/01/2023 12:30 PM    This note was created with Dragon medical transcription system.  Any errors from dictation are purely unintentional

## 2023-10-01 NOTE — Progress Notes (Signed)
 MRN : [id number]  Carrie [name] is a 73 y.o. (30-Oct-1950) female who presents with chief complaint of  Chief Complaint  Patient presents with   Follow-up    6 month HDA  .  History of Present Illness: Patient returns today in follow up of her dialysis access.  She has been using this left upper arm AV graft now for several years.  Recently, they have been having a more difficult time accessing the graft and getting good flows.  She denies any fever or chills.  No signs of infection.  She has had multiple previous interventions on this graft although it has been present for years.  Duplex today shows significantly elevated velocities in the proximal and mid portions of the graft consistent with a hemodynamically significant stenosis.  Current Outpatient Medications  Medication Sig Dispense Refill   acetaminophen  (TYLENOL ) 500 MG tablet Take 1,000 mg by mouth every 6 (six) hours as needed for mild pain or moderate pain.     albuterol  (VENTOLIN  HFA) 108 (90 Base) MCG/ACT inhaler Inhale 2 puffs into the lungs every 6 (six) hours as needed for wheezing or shortness of breath. 8 g 2   amLODipine  (NORVASC ) 10 MG tablet Take 10 mg by mouth daily.     atorvastatin  (LIPITOR) 40 MG tablet TAKE 40 MG BY MOUTH ONCE DAILY (Patient taking differently: Take 40 mg by mouth daily.) 90 tablet 3   benzonatate  (TESSALON ) 200 MG capsule Take 1 capsule (200 mg total) by mouth 3 (three) times daily as needed for cough. 60 capsule 0   hydrALAZINE  (APRESOLINE ) 25 MG tablet TAKE 1 TABLET BY MOUTH THREE TIMES A DAY 270 tablet 1   levETIRAcetam  (KEPPRA ) 500 MG tablet Take 1 tablet (500 mg total) by mouth 2 (two) times daily. 180 tablet 3   lidocaine -prilocaine  (EMLA ) cream Apply 1 application topically every Monday, Wednesday, and Friday with hemodialysis.     LORazepam  (ATIVAN ) 0.5 MG tablet TAKE 1 TABLET BY MOUTH TWICE A DAY AS NEEDED FOR ANXIETY 60 tablet 0   metoprolol  succinate (TOPROL -XL) 50 MG 24 hr tablet  Take 50 mg by mouth daily.     multivitamin (RENA-VIT) TABS tablet Take 1 tablet by mouth at bedtime. 30 tablet 0   omeprazole  (PRILOSEC) 20 MG capsule Take 1 capsule (20 mg total) by mouth in the morning and at bedtime. 180 capsule 3   ondansetron  (ZOFRAN -ODT) 4 MG disintegrating tablet Take 4 mg by mouth every 8 (eight) hours as needed for nausea or vomiting.     sevelamer  carbonate (RENVELA ) 800 MG tablet Take 800-1,600 mg by mouth See admin instructions. Take 2 tablets (1600 mg) by mouth three times daily with meals and take 1 tablet (800 mg) by mouth twice daily with snacks     torsemide  (DEMADEX ) 20 MG tablet Take 20 mg by mouth daily. Every day (including on dialysis days)     No current facility-administered medications for this visit.    Past Medical History:  Diagnosis Date   Allergy    Anemia    Arthritis    "right knee" (10/26'/2017)   Chronic heart failure with preserved ejection fraction (HFpEF) (HCC)    a. 04/2021 Echo: EF >55%, GrI DD, nl RV fxn, triv MR; b. 12/2021 Echo: EF 60-65%, no rwma.   Demand ischemia (HCC)    a. 04/2021 HsTrop up to 73 in setting of volume overload; b. 10/2021 MV: EF 66% with small, mild reversible defect in the apical septal and  mid anteroseptal myocardium which was felt to most likely represent artifact.   Depression    ESRD (end stage renal disease) (HCC)    a. On[facility] (Dr. Gari Junior)   History of blood transfusion 2008   "when I had brain tumor surgery"   History of MRSA infection 2008   History of stress test    a. 07/2018 MV: EF 66%, no ischemia/infarct.   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 12/2021 Echo: EF 60-65%, no rwma, mild LVH, nl RV fxn, small pericardial effusion - small pocket of post wall, ~ 1cm.   Precordial chest pain    a. 10/2021 MV: low risk.   Type II diabetes mellitus (HCC) 2011   on metformin  until yr ago- no med now - off due to kidneys    Past Surgical History:   Procedure Laterality Date   A/V FISTULAGRAM Left 03/10/2019   Procedure: A/V FISTULAGRAM;  Surgeon: Jackquelyn Mass, MD;  Location: [facility];  Service: Cardiovascular;  Laterality: Left;   A/V FISTULAGRAM Left 07/14/2019   Procedure: A/V FISTULAGRAM;  Surgeon: Jackquelyn Mass, MD;  Location: [facility];  Service: Cardiovascular;  Laterality: Left;   A/V FISTULAGRAM Left 01/07/2020   Procedure: A/V FISTULAGRAM;  Surgeon: Jackquelyn Mass, MD;  Location: [facility];  Service: Cardiovascular;  Laterality: Left;   A/V FISTULAGRAM Left 11/14/2021   Procedure: A/V Fistulagram;  Surgeon: Jackquelyn Mass, MD;  Location: [facility];  Service: Cardiovascular;  Laterality: Left;   AV FISTULA PLACEMENT Left 11/07/2018   Procedure: ARTERIOVENOUS (AV) FISTULA CREATION ( BRACHIO BASILIC ) VS BRACHIAL AXILLARY GRAFT;  Surgeon: Jackquelyn Mass, MD;  Location: [facility];  Service: Vascular;  Laterality: Left;   BRAIN MENINGIOMA EXCISION  08/2006   Foramina magnum meningioma   CERVICAL DISCECTOMY  1996   Dr. Wallace Gully   CESAREAN SECTION  1981   COLONOSCOPY     COLONOSCOPY WITH PROPOFOL  N/A 03/07/2021   Procedure: COLONOSCOPY WITH PROPOFOL ;  Surgeon: Irby Mannan, MD;  Location: [facility];  Service: Endoscopy;  Laterality: N/A;   DIALYSIS/PERMA CATHETER INSERTION N/A 08/04/2018   Procedure: DIALYSIS/PERMA CATHETER INSERTION;  Surgeon: Celso College, MD;  Location: [facility];  Service: Cardiovascular;  Laterality: N/A;   DIALYSIS/PERMA CATHETER REMOVAL N/A 12/25/2018   Procedure: DIALYSIS/PERMA CATHETER REMOVAL;  Surgeon: Celso College, MD;  Location: [facility];  Service: Cardiovascular;  Laterality: N/A;   DILATION AND CURETTAGE OF UTERUS     JOINT REPLACEMENT     right knee left hip   MULTIPLE TOOTH EXTRACTIONS  01/30/2023   Right wrist x-ray  2007   Deg. at 1st MCP   TOTAL HIP ARTHROPLASTY Left 03/27/2016   Procedure:  LEFT TOTAL HIP ARTHROPLASTY ANTERIOR APPROACH;  Surgeon: Arnie Lao, MD;  Location: [facility];  Service: Orthopedics;  Laterality: Left;   TOTAL KNEE ARTHROPLASTY Right 04/30/2017   Procedure: RIGHT TOTAL KNEE ARTHROPLASTY;  Surgeon: Arnie Lao, MD;  Location: [facility] OR;  Service: Orthopedics;  Laterality: Right;   TOTAL KNEE ARTHROPLASTY Left 03/13/2022   Procedure: LEFT TOTAL KNEE ARTHROPLASTY;  Surgeon: Arnie Lao, MD;  Location: [facility] OR;  Service: Orthopedics;  Laterality: Left;   TUBAL LIGATION  1981     Social History   Tobacco Use   Smoking status: Never    Passive exposure: Past  Smokeless tobacco: Never  Vaping Use   Vaping status: Never Used  Substance Use Topics   Alcohol use: No    Alcohol/week: 0.0 standard drinks of alcohol   Drug use: No      Family History  Problem Relation Age of Onset   Hypertension Other        Strong family history    Breast cancer Mother 80   Heart failure Sister    Heart disease Sister      Allergies  Allergen Reactions   Naproxen Sodium Anaphylaxis and Swelling   Sulfamethoxazole-Trimethoprim Anaphylaxis and Swelling     . REVIEW OF SYSTEMS (Negative unless checked)   Constitutional: [] Weight loss  [] Fever  [] Chills Cardiac: [] Chest pain   [] Chest pressure   [] Palpitations   [] Shortness of breath when laying flat   [] Shortness of breath at rest   [x] Shortness of breath with exertion. Vascular:  [] Pain in legs with walking   [] Pain in legs at rest   [] Pain in legs when laying flat   [] Claudication   [] Pain in feet when walking  [] Pain in feet at rest  [] Pain in feet when laying flat   [] History of DVT   [] Phlebitis   [x] Swelling in legs   [] Varicose veins   [] Non-healing ulcers Pulmonary:   [] Uses home oxygen   [] Productive cough   [] Hemoptysis   [] Wheeze  [] COPD   [] Asthma Neurologic:  [] Dizziness  [] Blackouts   [] Seizures   [] History of stroke   [] History of TIA  [] Aphasia   [] Temporary blindness    [] Dysphagia   [] Weakness or numbness in arms   [] Weakness or numbness in legs Musculoskeletal:  [x] Arthritis   [] Joint swelling   [x] Joint pain   [] Low back pain Hematologic:  [] Easy bruising  [] Easy bleeding   [] Hypercoagulable state   [x] Anemic  [] Hepatitis Gastrointestinal:  [] Blood in stool   [] Vomiting blood  [] Gastroesophageal reflux/heartburn   [] Abdominal pain Genitourinary:  [x] Chronic kidney disease   [] Difficult urination  [] Frequent urination  [] Burning with urination   [] Hematuria Skin:  [] Rashes   [] Ulcers   [] Wounds Psychological:  [] History of anxiety   []  History of major depression.  Physical Examination  BP (!) 174/78   Pulse 61   Resp 18   Ht 5\' 2"  (1.575 m)   Wt 214 lb (97.1 kg)   BMI 39.14 kg/m  Gen:  WD/WN, NAD Head: [place]/AT, No temporalis wasting. Ear/Nose/Throat: Hearing grossly intact, nares w/o erythema or drainage Eyes: Conjunctiva clear. Sclera non-icteric Neck: Supple.  Trachea midline Pulmonary:  Good air movement, no use of accessory muscles.  Cardiac: RRR, no JVD Vascular: left arm AVG with weak thrill in the proximal upper arm and more pulsatile near the elbow Vessel Right Left  Radial Palpable Palpable                       Musculoskeletal: M/S 5/5 throughout.  No deformity or atrophy. Mild LE edema. Neurologic: Sensation grossly intact in extremities.  Symmetrical.  Speech is fluent.  Psychiatric: Judgment intact, Mood & affect appropriate for pt's clinical situation. Dermatologic: No rashes or ulcers noted.  No cellulitis or open wounds.      Labs No results found for this or any previous visit (from the past 2160 hours).  Radiology No results found.  Assessment/Plan  ESRD on hemodialysis (HCC) Duplex today shows significantly elevated velocities in the proximal and mid portions of the graft consistent with a hemodynamically significant stenosis.  This finding  associated with worsening problems with use of the graft, I would  recommend a shuntogram with possible intervention.  Risks and benefits were discussed and she is agreeable to proceed.  We discussed that without intervention, loss of the graft is certainly a possibility.  Essential hypertension An underlying cause of ESRD and blood pressure control important in reducing the progression of atherosclerotic disease. On appropriate oral medications.     Hyperlipidemia lipid control important in reducing the progression of atherosclerotic disease. Continue statin therapy  Mikki Alexander, MD  10/01/2023 12:30 PM    This note was created with Dragon medical transcription system.  Any errors from dictation are purely unintentional

## 2023-10-01 NOTE — Patient Instructions (Signed)
 Dialysis Vascular Access Problems: What to Know        A dialysis vascular access is the place on your body where your blood is taken out and then put back in during hemodialysis. Hemodialysis is a process where a filter, called a dialyzer, is used to clean your blood when your kidneys can't do it. There are three types of vascular access: An arteriovenous fistula (AVF). This is made by connecting an artery and a vein to make a bigger blood vessel called a fistula. An arteriovenous graft (AVG). This connects an artery and a vein using a soft tube called a graft. A central venous catheter (CVC). A CVC is a long, thin tube that's put in a big vein in your neck, chest, or groin. Any kind of access can have problems. Some problems depend on the type of access. And some types of access tend to have more problems than others. You'll learn what your access should look and feel like. Let your health care provider know about any changes. Getting treatment right away can help keep problems from getting worse and could save your access. What are the causes? Here are some of the common problems, what causes them, and how they might be treated: Blockage Blood clots Clots are the most common cause of problems in all types of vascular access. When a blood clot forms inside the access, it can block all or some blood flow. Blood clots can form if blood isn't flowing through the access like it should. This is more likely if you have low blood pressure, heart problems, or clotting issues. Clots can be treated with procedures that break up or remove the clot. Clot-busting medicines may also be used. If clots keep forming, you may need to have a second access site made as a backup option to use. Kinks A kink in the graft or catheter can block it. Surgery is needed to fix the kink or replace the access. Stenosis Stenosis is when a fistula or graft gets narrow. Over time it can become so narrow that it's fully  blocked. This narrowing can be treated by using a balloon to stretch it open. A small, stiff tube called a stent can also be put in to keep the fistula or graft open. Infection Any type of access can get infected, but it's more common in CVCs. This is because the skin isn't closed where the CVC comes out. Most infections can be treated with antibiotics. Dialysis access steal syndrome (DASS) DASS happens when the limb with the fistula or graft isn't getting a good blood supply. This is because the access "steals" the blood and sends it back to the body before it goes through the whole limb. DASS may be closely watched. Sometimes it gets better on its own. But some people need surgery to get better blood flow to the limb with the access. Aneurysm An aneurysm is a sudden bulge in a fistula or graft access. It can be caused by a tear or weakness in the blood vessels of the access. An aneurysm may be treated with surgery to fix the tear or take out the weak part. Sometimes treatment isn't needed. It depends on where and how big the aneurysm is, and what symptoms it's causing. What are the signs or symptoms? Access flow issues and clots are the leading causes of vascular access loss. You'll be taught how to check your access to be sure it's OK. Some common symptoms of vascular access problems that you  can watch for are: A change in the vibration or buzz of your fistula or graft. This is called a thrill. Feeling no thrill in your fistula or graft. New or unusual swelling or pain around your access. Pale skin or the skin feeling cool or numb in the area with the access. Signs of infection, such as: Pain, swelling, redness, or red streaks near your access. Blood or pus coming from the access. Other problems may be noticed during dialysis, such as: The health care team having trouble using your access. Slow flow of blood through your access. Dialysis will not work well if this happens. Bleeding that  can't be easily stopped when the needles are removed after treatment. How is this diagnosed? Access problems may be diagnosed with: An X-ray that uses dye, or contrast. Ultrasound of the fistula or graft. Blood tests. Contact a health care provider if: You notice any changes in how your access looks or feels. Swelling around your access gets worse. You have new pain at your access. You have pus or other fluid coming from your access. You have chills or a fever. You have any of these problems on the hand of your arm with the access: Blue fingers or numbness. A pale skin color. Sores at the tips of the fingers. You have redness or red streaks on the skin around, above, or below your access. If you can't reach your provider, go to an urgent care or emergency room. Get help right away if: You have bleeding at your access that can't be easily stopped. Your access is suddenly hot, swollen, red, and very painful. You can suddenly see parts of the access that were below the skin. These symptoms may be an emergency. Call 911 right away. Do not wait to see if the symptoms will go away. Do not drive yourself to the hospital. This information is not intended to replace advice given to you by your health care provider. Make sure you discuss any questions you have with your health care provider. Document Revised: 10/26/2022 Document Reviewed: 10/26/2022 Elsevier Patient Education  2024 [employer].

## 2023-10-02 DIAGNOSIS — Z992 Dependence on renal dialysis: Secondary | ICD-10-CM | POA: Diagnosis not present

## 2023-10-02 DIAGNOSIS — N186 End stage renal disease: Secondary | ICD-10-CM | POA: Diagnosis not present

## 2023-10-03 DIAGNOSIS — N186 End stage renal disease: Secondary | ICD-10-CM | POA: Diagnosis not present

## 2023-10-03 DIAGNOSIS — Z992 Dependence on renal dialysis: Secondary | ICD-10-CM | POA: Diagnosis not present

## 2023-10-04 DIAGNOSIS — N186 End stage renal disease: Secondary | ICD-10-CM | POA: Diagnosis not present

## 2023-10-04 DIAGNOSIS — Z992 Dependence on renal dialysis: Secondary | ICD-10-CM | POA: Diagnosis not present

## 2023-10-07 ENCOUNTER — Telehealth (INDEPENDENT_AMBULATORY_CARE_PROVIDER_SITE_OTHER): Payer: Self-pay

## 2023-10-07 DIAGNOSIS — N186 End stage renal disease: Secondary | ICD-10-CM | POA: Diagnosis not present

## 2023-10-07 DIAGNOSIS — Z992 Dependence on renal dialysis: Secondary | ICD-10-CM | POA: Diagnosis not present

## 2023-10-07 NOTE — Telephone Encounter (Addendum)
 I spoke with the patient and she is scheduled for a left arm shuntogram with Dr. Vonna Guardian with a 12:30 pm arrival time to the [facility]. Pre-procedure instructions were discussed and will be sent to Mychart and mailed.

## 2023-10-09 DIAGNOSIS — N186 End stage renal disease: Secondary | ICD-10-CM | POA: Diagnosis not present

## 2023-10-09 DIAGNOSIS — Z992 Dependence on renal dialysis: Secondary | ICD-10-CM | POA: Diagnosis not present

## 2023-10-09 DIAGNOSIS — E119 Type 2 diabetes mellitus without complications: Secondary | ICD-10-CM | POA: Diagnosis not present

## 2023-10-11 DIAGNOSIS — Z992 Dependence on renal dialysis: Secondary | ICD-10-CM | POA: Diagnosis not present

## 2023-10-11 DIAGNOSIS — N186 End stage renal disease: Secondary | ICD-10-CM | POA: Diagnosis not present

## 2023-10-14 DIAGNOSIS — Z992 Dependence on renal dialysis: Secondary | ICD-10-CM | POA: Diagnosis not present

## 2023-10-14 DIAGNOSIS — N186 End stage renal disease: Secondary | ICD-10-CM | POA: Diagnosis not present

## 2023-10-15 ENCOUNTER — Encounter

## 2023-10-16 DIAGNOSIS — N186 End stage renal disease: Secondary | ICD-10-CM | POA: Diagnosis not present

## 2023-10-16 DIAGNOSIS — Z992 Dependence on renal dialysis: Secondary | ICD-10-CM | POA: Diagnosis not present

## 2023-10-17 ENCOUNTER — Encounter: Payer: Self-pay | Admitting: Vascular Surgery

## 2023-10-17 ENCOUNTER — Telehealth (INDEPENDENT_AMBULATORY_CARE_PROVIDER_SITE_OTHER): Payer: Self-pay | Admitting: Vascular Surgery

## 2023-10-17 ENCOUNTER — Encounter
Admission: RE | Disposition: A | Discharge status: Home / Self Care | Payer: Self-pay | Source: Home / Self Care | Attending: Vascular Surgery

## 2023-10-17 ENCOUNTER — Other Ambulatory Visit: Payer: Self-pay

## 2023-10-17 ENCOUNTER — Ambulatory Visit
Admission: RE | Admit: 2023-10-17 | Discharge: 2023-10-17 | Disposition: A | Discharge status: Home / Self Care | Attending: Vascular Surgery | Admitting: Vascular Surgery

## 2023-10-17 DIAGNOSIS — I5032 Chronic diastolic (congestive) heart failure: Secondary | ICD-10-CM | POA: Insufficient documentation

## 2023-10-17 DIAGNOSIS — I132 Hypertensive heart and chronic kidney disease with heart failure and with stage 5 chronic kidney disease, or end stage renal disease: Secondary | ICD-10-CM | POA: Insufficient documentation

## 2023-10-17 DIAGNOSIS — Z992 Dependence on renal dialysis: Secondary | ICD-10-CM | POA: Diagnosis not present

## 2023-10-17 DIAGNOSIS — Z7722 Contact with and (suspected) exposure to environmental tobacco smoke (acute) (chronic): Secondary | ICD-10-CM | POA: Diagnosis not present

## 2023-10-17 DIAGNOSIS — E1122 Type 2 diabetes mellitus with diabetic chronic kidney disease: Secondary | ICD-10-CM | POA: Insufficient documentation

## 2023-10-17 DIAGNOSIS — N186 End stage renal disease: Secondary | ICD-10-CM | POA: Diagnosis not present

## 2023-10-17 DIAGNOSIS — E785 Hyperlipidemia, unspecified: Secondary | ICD-10-CM | POA: Diagnosis not present

## 2023-10-17 DIAGNOSIS — Y832 Surgical operation with anastomosis, bypass or graft as the cause of abnormal reaction of the patient, or of later complication, without mention of misadventure at the time of the procedure: Secondary | ICD-10-CM | POA: Insufficient documentation

## 2023-10-17 DIAGNOSIS — T82858A Stenosis of vascular prosthetic devices, implants and grafts, initial encounter: Secondary | ICD-10-CM | POA: Diagnosis not present

## 2023-10-17 HISTORY — PX: A/V SHUNTOGRAM: CATH118297

## 2023-10-17 LAB — GLUCOSE, CAPILLARY: Glucose-Capillary: 94 mg/dL (ref 70–99)

## 2023-10-17 LAB — POTASSIUM (ARMC VASCULAR LAB ONLY): Potassium (ARMC vascular lab): 4.1 mmol/L (ref 3.5–5.1)

## 2023-10-17 SURGERY — A/V SHUNTOGRAM
Anesthesia: Moderate Sedation | Laterality: Left

## 2023-10-17 MED ORDER — HEPARIN SODIUM (PORCINE) 1000 UNIT/ML IJ SOLN
INTRAMUSCULAR | Status: AC
  Filled 2023-10-17: qty 10

## 2023-10-17 MED ORDER — HYDROMORPHONE HCL 1 MG/ML IJ SOLN: 1.0000 mg | Freq: Once | INTRAMUSCULAR | Status: DC | PRN

## 2023-10-17 MED ORDER — METHYLPREDNISOLONE SODIUM SUCC 125 MG IJ SOLR: 125.0000 mg | Freq: Once | INTRAMUSCULAR | Status: DC | PRN

## 2023-10-17 MED ORDER — HEPARIN (PORCINE) IN NACL 1000-0.9 UT/500ML-% IV SOLN
INTRAVENOUS | Status: DC | PRN
  Administered 2023-10-17: 500 mL

## 2023-10-17 MED ORDER — FENTANYL CITRATE (PF) 100 MCG/2ML IJ SOLN
INTRAMUSCULAR | Status: DC | PRN
  Administered 2023-10-17: 50 ug via INTRAVENOUS

## 2023-10-17 MED ORDER — MIDAZOLAM HCL 2 MG/2ML IJ SOLN
INTRAMUSCULAR | Status: DC | PRN
  Administered 2023-10-17: 2 mg via INTRAVENOUS

## 2023-10-17 MED ORDER — HEPARIN SODIUM (PORCINE) 1000 UNIT/ML IJ SOLN
INTRAMUSCULAR | Status: DC | PRN
  Administered 2023-10-17: 3000 [IU] via INTRAVENOUS

## 2023-10-17 MED ORDER — DIPHENHYDRAMINE HCL 50 MG/ML IJ SOLN: 50.0000 mg | Freq: Once | INTRAMUSCULAR | Status: DC | PRN

## 2023-10-17 MED ORDER — CEFAZOLIN SODIUM-DEXTROSE 1-4 GM/50ML-% IV SOLN
INTRAVENOUS | Status: AC
  Filled 2023-10-17: qty 50

## 2023-10-17 MED ORDER — IODIXANOL 320 MG/ML IV SOLN
INTRAVENOUS | Status: DC | PRN
  Administered 2023-10-17: 15 mL

## 2023-10-17 MED ORDER — FAMOTIDINE 20 MG PO TABS: 40.0000 mg | ORAL_TABLET | Freq: Once | ORAL | Status: DC | PRN

## 2023-10-17 MED ORDER — MIDAZOLAM HCL 2 MG/ML PO SYRP: 8.0000 mg | ORAL_SOLUTION | Freq: Once | ORAL | Status: DC | PRN

## 2023-10-17 MED ORDER — SODIUM CHLORIDE 0.9 % IV SOLN: INTRAVENOUS | Status: DC

## 2023-10-17 MED ORDER — LIDOCAINE-EPINEPHRINE (PF) 1 %-1:200000 IJ SOLN
INTRAMUSCULAR | Status: DC | PRN
Start: 2023-10-17 — End: 2023-10-17
  Administered 2023-10-17: 10 mL

## 2023-10-17 MED ORDER — CEFAZOLIN SODIUM-DEXTROSE 1-4 GM/50ML-% IV SOLN
1.0000 g | INTRAVENOUS | Status: AC
  Administered 2023-10-17: 1 g via INTRAVENOUS

## 2023-10-17 MED ORDER — FENTANYL CITRATE (PF) 100 MCG/2ML IJ SOLN
INTRAMUSCULAR | Status: AC
  Filled 2023-10-17: qty 2

## 2023-10-17 MED ORDER — MIDAZOLAM HCL 5 MG/5ML IJ SOLN
INTRAMUSCULAR | Status: AC
  Filled 2023-10-17: qty 5

## 2023-10-17 MED ORDER — ONDANSETRON HCL 4 MG/2ML IJ SOLN: 4.0000 mg | Freq: Four times a day (QID) | INTRAMUSCULAR | Status: DC | PRN

## 2023-10-17 SURGICAL SUPPLY — 11 items
BALLOON DORADO7X100X80 (BALLOONS) IMPLANT
COVER PROBE ULTRASOUND 5X96 (MISCELLANEOUS) IMPLANT
DEVICE PRESTO INFLATION (MISCELLANEOUS) IMPLANT
DRAPE BRACHIAL (DRAPES) IMPLANT
NDL ENTRY 21GA 7CM ECHOTIP (NEEDLE) IMPLANT
NEEDLE ENTRY 21GA 7CM ECHOTIP (NEEDLE) ×1 IMPLANT
PACK ANGIOGRAPHY (CUSTOM PROCEDURE TRAY) ×2 IMPLANT
SET INTRO CAPELLA COAXIAL (SET/KITS/TRAYS/PACK) IMPLANT
SHEATH BRITE TIP 6FRX5.5 (SHEATH) IMPLANT
SUT MNCRL AB 4-0 PS2 18 (SUTURE) IMPLANT
WIRE SUPRACORE 190CM (WIRE) IMPLANT

## 2023-10-17 NOTE — Interval H&P Note (Signed)
 History and Physical Interval Note:  10/17/2023 12:43 PM  Carrie [name]  has presented today for surgery, with the diagnosis of L arm shuntogram    End Stage Renal.  The various methods of treatment have been discussed with the patient and family. After consideration of risks, benefits and other options for treatment, the patient has consented to  Procedure(s): A/V SHUNTOGRAM (Left) as a surgical intervention.  The patient's history has been reviewed, patient examined, no change in status, stable for surgery.  I have reviewed the patient's chart and labs.  Questions were answered to the patient's satisfaction.     Fidelia Cathers

## 2023-10-17 NOTE — Telephone Encounter (Signed)
 Called pt to [facility] to schedule post op  Follow up with Celso College, MD (Vascular Surgery) in 6 weeks (11/28/2023); with HDA

## 2023-10-17 NOTE — Op Note (Signed)
 [facility]    OPERATIVE NOTE   PROCEDURE: 1.  Left brachial artery to axillary vein arteriovenous graft cannulation under ultrasound guidance 2.  Left arm shuntogram 3.  Percutaneous transluminal angioplasty of intragraft stenosis with 7 mm diameter by 10 cm length high-pressure angioplasty balloon  PRE-OPERATIVE DIAGNOSIS: 1. ESRD 2. Malfunctioning left brachial artery to axillary vein arteriovenous graft  POST-OPERATIVE DIAGNOSIS: same as above   SURGEON: Mikki Alexander, MD  ANESTHESIA: local with MCS  ESTIMATED BLOOD LOSS: 5 cc  FINDING(S): Stenosis within the graft and the previously stented portion throughout much of the access site of greater than 70%.  The venous anastomosis and central venous circulation were widely patent.  SPECIMEN(S):  None  CONTRAST: 15 cc  FLUORO TIME: 1 minute  MODERATE CONSCIOUS SEDATION TIME:  Approximately 20 minutes using 2 mg of Versed  and 50 mcg of Fentanyl   INDICATIONS: Carrie [name] is a 73 y.o. female who presents with malfunctioning left brachial artery and axillary vein arteriovenous graft.  The patient is scheduled for left arm shuntogram.  The patient is aware the risks include but are not limited to: bleeding, infection, thrombosis of the cannulated access, and possible anaphylactic reaction to the contrast.  The patient is aware of the risks of the procedure and elects to proceed forward.  DESCRIPTION: After full informed written consent was obtained, the patient was brought back to the angiography suite and placed supine upon the angiography table.  The patient was connected to monitoring equipment. Moderate conscious sedation was administered during a face to face encounter throughout the procedure with my supervision of the RN administering medicines and monitoring the patient's vital signs, pulse oximetry, telemetry and mental status throughout from the start of the procedure until the patient was taken to  the recovery room The left arm was prepped and draped in the standard fashion for a percutaneous access intervention.  Under ultrasound guidance, the left brachial artery to axillary vein arteriovenous graft was cannulated with a micropuncture needle under direct ultrasound guidance were it was patent and a permanent image was performed.  The microwire was advanced into the graft and the needle was exchanged for the a microsheath.  I then upsized to a 6 Fr Sheath and imaging was performed.  Hand injections were completed to image the access including the central venous system. This demonstrated Stenosis within the graft and the previously stented portion throughout much of the access site of greater than 70%.  The venous anastomosis and central venous circulation were widely patent.  Based on the images, this patient will need intervention to this mid graft stenosis. I then gave the patient 3000 units of intravenous heparin .  I then crossed the stenosis with a Supracore wire.  Based on the imaging, a 7 mm x 10 cm  high pressure angioplasty balloon was selected.  The balloon was centered around the stenosis in the graft and inflated to 20 ATM for 1 minute(s).  On completion imaging, a 20-25% residual stenosis was present.     Based on the completion imaging, no further intervention is necessary.  The wire and balloon were removed from the sheath.  A 4-0 Monocryl purse-string suture was sewn around the sheath.  The sheath was removed while tying down the suture.  A sterile bandage was applied to the puncture site.  COMPLICATIONS: None  CONDITION: Stable   Mikki Alexander  10/17/2023 2:05 PM    This note was created with Dragon Medical transcription system. Any  errors in dictation are purely unintentional.

## 2023-10-21 DIAGNOSIS — Z992 Dependence on renal dialysis: Secondary | ICD-10-CM | POA: Diagnosis not present

## 2023-10-21 DIAGNOSIS — N186 End stage renal disease: Secondary | ICD-10-CM | POA: Diagnosis not present

## 2023-10-22 ENCOUNTER — Encounter (INDEPENDENT_AMBULATORY_CARE_PROVIDER_SITE_OTHER): Payer: Self-pay

## 2023-10-23 DIAGNOSIS — N186 End stage renal disease: Secondary | ICD-10-CM | POA: Diagnosis not present

## 2023-10-23 DIAGNOSIS — Z992 Dependence on renal dialysis: Secondary | ICD-10-CM | POA: Diagnosis not present

## 2023-10-24 ENCOUNTER — Other Ambulatory Visit: Payer: Self-pay | Admitting: Internal Medicine

## 2023-10-24 NOTE — Telephone Encounter (Signed)
 Last filled 09-23-23 #60 Last OV 08-08-23 Next OV 01-28-24 [facility]

## 2023-10-25 DIAGNOSIS — N186 End stage renal disease: Secondary | ICD-10-CM | POA: Diagnosis not present

## 2023-10-25 DIAGNOSIS — Z992 Dependence on renal dialysis: Secondary | ICD-10-CM | POA: Diagnosis not present

## 2023-10-28 DIAGNOSIS — Z992 Dependence on renal dialysis: Secondary | ICD-10-CM | POA: Diagnosis not present

## 2023-10-28 DIAGNOSIS — N186 End stage renal disease: Secondary | ICD-10-CM | POA: Diagnosis not present

## 2023-10-30 DIAGNOSIS — N186 End stage renal disease: Secondary | ICD-10-CM | POA: Diagnosis not present

## 2023-10-30 DIAGNOSIS — Z992 Dependence on renal dialysis: Secondary | ICD-10-CM | POA: Diagnosis not present

## 2023-11-01 DIAGNOSIS — Z992 Dependence on renal dialysis: Secondary | ICD-10-CM | POA: Diagnosis not present

## 2023-11-01 DIAGNOSIS — N186 End stage renal disease: Secondary | ICD-10-CM | POA: Diagnosis not present

## 2023-11-02 DIAGNOSIS — N186 End stage renal disease: Secondary | ICD-10-CM | POA: Diagnosis not present

## 2023-11-02 DIAGNOSIS — Z992 Dependence on renal dialysis: Secondary | ICD-10-CM | POA: Diagnosis not present

## 2023-11-03 DIAGNOSIS — N186 End stage renal disease: Secondary | ICD-10-CM | POA: Diagnosis not present

## 2023-11-03 DIAGNOSIS — Z992 Dependence on renal dialysis: Secondary | ICD-10-CM | POA: Diagnosis not present

## 2023-11-04 DIAGNOSIS — N186 End stage renal disease: Secondary | ICD-10-CM | POA: Diagnosis not present

## 2023-11-04 DIAGNOSIS — Z992 Dependence on renal dialysis: Secondary | ICD-10-CM | POA: Diagnosis not present

## 2023-11-05 ENCOUNTER — Telehealth: Payer: Self-pay

## 2023-11-05 DIAGNOSIS — H25013 Cortical age-related cataract, bilateral: Secondary | ICD-10-CM | POA: Diagnosis not present

## 2023-11-05 DIAGNOSIS — Z0100 Encounter for examination of eyes and vision without abnormal findings: Secondary | ICD-10-CM | POA: Diagnosis not present

## 2023-11-05 DIAGNOSIS — H353111 Nonexudative age-related macular degeneration, right eye, early dry stage: Secondary | ICD-10-CM | POA: Diagnosis not present

## 2023-11-05 DIAGNOSIS — H353131 Nonexudative age-related macular degeneration, bilateral, early dry stage: Secondary | ICD-10-CM | POA: Diagnosis not present

## 2023-11-05 DIAGNOSIS — H353121 Nonexudative age-related macular degeneration, left eye, early dry stage: Secondary | ICD-10-CM | POA: Diagnosis not present

## 2023-11-05 DIAGNOSIS — E113293 Type 2 diabetes mellitus with mild nonproliferative diabetic retinopathy without macular edema, bilateral: Secondary | ICD-10-CM | POA: Diagnosis not present

## 2023-11-05 LAB — HM DIABETES EYE EXAM

## 2023-11-06 ENCOUNTER — Telehealth: Payer: Self-pay

## 2023-11-06 DIAGNOSIS — N186 End stage renal disease: Secondary | ICD-10-CM | POA: Diagnosis not present

## 2023-11-06 DIAGNOSIS — Z992 Dependence on renal dialysis: Secondary | ICD-10-CM | POA: Diagnosis not present

## 2023-11-06 NOTE — Patient Outreach (Signed)
 Complex Care Management   Visit Note  11/06/2023  Name:  Carrie [name] MRN: [id number] DOB: 28-Sep-1950  Situation: Referral received for Complex Care Management related to Heart Failure and ESRD I obtained verbal consent from Patient.  Visit completed with patient  on the phone  Background:   Past Medical History:  Diagnosis Date   Allergy    Anemia    Arthritis    "right knee" (10/26'/2017)   Chronic heart failure with preserved ejection fraction (HFpEF) (HCC)    a. 04/2021 Echo: EF >55%, GrI DD, nl RV fxn, triv MR; b. 12/2021 Echo: EF 60-65%, no rwma.   Demand ischemia (HCC)    a. 04/2021 HsTrop up to 73 in setting of volume overload; b. 10/2021 MV: EF 66% with small, mild reversible defect in the apical septal and mid anteroseptal myocardium which was felt to most likely represent artifact.   Depression    ESRD (end stage renal disease) (HCC)    a. OnHD (Dr. Gari Junior)   History of blood transfusion 2008   "when I had brain tumor surgery"   History of MRSA infection 2008   History of stress test    a. 07/2018 MV: EF 66%, no ischemia/infarct.   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 12/2021 Echo: EF 60-65%, no rwma, mild LVH, nl RV fxn, small pericardial effusion - small pocket of post wall, ~ 1cm.   Precordial chest pain    a. 10/2021 MV: low risk.   Type II diabetes mellitus (HCC) 2011   on metformin  until yr ago- no med now - off due to kidneys    Assessment: Patient Reported Symptoms:  Cognitive Cognitive Status: Alert and oriented to person, place, and time, Insightful and able to interpret abstract concepts, Normal speech and language skills      Neurological Neurological Review of Symptoms: No symptoms reported    HEENT HEENT Symptoms Reported: No symptoms reported HEENT Conditions:  (cataract) HEENT Comment: patient reports having recent eye appointment. She states provider states her cataracts are unchanged and  there is no need for treatment at this time. Next follow up visit is in 1 years.  (cataract)  Cardiovascular Cardiovascular Symptoms Reported: No symptoms reported Does patient have uncontrolled Hypertension?: No Cardiovascular Conditions: Hypertension, Heart failure Cardiovascular Management Strategies: Routine screening, Diet modification, Medication therapy, Fluid modification Weight: 211 lb (95.7 kg) Cardiovascular Self-Management Outcome: 4 (good) Cardiovascular Comment: Patient states her next primary care visit ins 01/28/24.  Respiratory Respiratory Symptoms Reported: Dry cough Respiratory Conditions:  (dry cough) Respiratory Comment: Patient states she continues to have a dry cough.  She states her doctor prescribed Musinex DM which is helping some.  Endocrine Patient reports the following symptoms related to hypoglycemia or hyperglycemia : No symptoms reported Is patient diabetic?: No    Gastrointestinal Gastrointestinal Symptoms Reported: No symptoms reported      Genitourinary Genitourinary Symptoms Reported: No symptoms reported Genitourinary Conditions: End-stage renal disease Genitourinary Management Strategies: Hemodialysis Hemodialysis Schedule: MWF Hemodialysis Last Treatment: 11/06/23 Genitourinary Self-Management Outcome: 4 (good) Genitourinary Comment: Patient reports having balloon surgery to graft on 10/17/23 due to graft narrowing. She reports her stiches were removed on 11/04/23 and graft is doing well.  Denies any pain. Reports having dialysis today. Patient states she is scheduled to have a follow up appointment with vascular doctor on 6/26//25.  Integumentary Integumentary Symptoms Reported: No symptoms reported    Musculoskeletal Musculoskelatal Symptoms  Reviewed: No symptoms reported   Falls in the past year?: No Number of falls in past year: 1 or less Was there an injury with Fall?: No Fall Risk Category Calculator: 0 Patient Fall Risk Level: Low Fall Risk     Psychosocial Psychosocial Symptoms Reported: No symptoms reported            02/26/2023    9:32 AM  Depression screen PHQ 2/9  Decreased Interest 0  Down, Depressed, Hopeless 0  PHQ - 2 Score 0    Vitals:   11/06/23 1151  BP: 134/76    Medications Reviewed Today     Reviewed by Aldona Bryner E, RN (Registered Nurse) on 11/06/23 at 1152  Med List Status: <None>   Medication Order Taking? Sig Documenting Provider Last Dose Status Informant  acetaminophen  (TYLENOL ) 500 MG tablet [id number] No Take 1,000 mg by mouth every 6 (six) hours as needed for mild pain or moderate pain. [provider] 10/16/2023 Active Self  albuterol  (VENTOLIN  HFA) 108 (90 Base) MCG/ACT inhaler [id number] No Inhale 2 puffs into the lungs every 6 (six) hours as needed for wheezing or shortness of breath. Montey Apa, DO Taking Active Self  amLODipine  (NORVASC ) 10 MG tablet [id number] No Take 10 mg by mouth daily. [provider] 10/17/2023 Active Self  atorvastatin  (LIPITOR) 40 MG tablet [id number] No TAKE 40 MG BY MOUTH ONCE DAILY  Patient taking differently: Take 40 mg by mouth daily.   Letvak, Richard I, MD 10/17/2023 Active Self  benzonatate  (TESSALON ) 200 MG capsule [id number] No Take 1 capsule (200 mg total) by mouth 3 (three) times daily as needed for cough. Helaine Llanos, MD Taking Active   hydrALAZINE  (APRESOLINE ) 25 MG tablet [id number] No TAKE 1 TABLET BY MOUTH THREE TIMES A DAY Helaine Llanos, MD 10/17/2023 Active   levETIRAcetam  (KEPPRA ) 500 MG tablet [id number] No Take 1 tablet (500 mg total) by mouth 2 (two) times daily. Helaine Llanos, MD 10/17/2023 Active   lidocaine -prilocaine  (EMLA ) cream [id number] No Apply 1 application topically every Monday, Wednesday, and Friday with hemodialysis. [provider] 10/16/2023 Active Self  LORazepam  (ATIVAN ) 0.5 MG tablet [id number]  TAKE 1 TABLET BY MOUTH TWICE A DAY AS NEEDED FOR ANXIETY Letvak, Richard I, MD  Active    metoprolol  succinate (TOPROL -XL) 50 MG 24 hr tablet [id number] No Take 50 mg by mouth daily. [provider] 10/17/2023 Active Self  multivitamin (RENA-VIT) TABS tablet [id number] No Take 1 tablet by mouth at bedtime. Verla Glaze, MD 10/16/2023 Active Self  omeprazole  (PRILOSEC) 20 MG capsule [id number] No Take 1 capsule (20 mg total) by mouth in the morning and at bedtime. Curt Dover I, MD 10/17/2023 Active   ondansetron  (ZOFRAN -ODT) 4 MG disintegrating tablet [id number] No Take 4 mg by mouth every 8 (eight) hours as needed for nausea or vomiting. [provider] Taking Active Self  sevelamer  carbonate (RENVELA ) 800 MG tablet [id number] No Take 800-1,600 mg by mouth See admin instructions. Take 2 tablets (1600 mg) by mouth three times daily with meals and take 1 tablet (800 mg) by mouth twice daily with snacks [provider] 10/16/2023 Active Self  torsemide  (DEMADEX ) 20 MG tablet [id number] No Take 20 mg by mouth daily. Every day (including on dialysis days) [provider] 10/16/2023 Active Self            Recommendation:   PCP Follow-up  Follow Up Plan:   Telephone follow-up in 1 month. Patient transferring to Michele Ahle, RN  for ongoing complex case management follow up.  Patient agreeable to transfer and ongoing follow up.  Verba Girt RN, BSN, CCM [employer], Population Health Case Manager Phone: [phone]

## 2023-11-08 DIAGNOSIS — Z992 Dependence on renal dialysis: Secondary | ICD-10-CM | POA: Diagnosis not present

## 2023-11-08 DIAGNOSIS — N186 End stage renal disease: Secondary | ICD-10-CM | POA: Diagnosis not present

## 2023-11-09 DIAGNOSIS — H524 Presbyopia: Secondary | ICD-10-CM | POA: Diagnosis not present

## 2023-11-11 DIAGNOSIS — N186 End stage renal disease: Secondary | ICD-10-CM | POA: Diagnosis not present

## 2023-11-11 DIAGNOSIS — Z992 Dependence on renal dialysis: Secondary | ICD-10-CM | POA: Diagnosis not present

## 2023-11-12 ENCOUNTER — Ambulatory Visit
Admission: RE | Admit: 2023-11-12 | Discharge: 2023-11-12 | Disposition: A | Discharge status: Home / Self Care | Source: Ambulatory Visit | Attending: Internal Medicine | Admitting: Internal Medicine

## 2023-11-12 DIAGNOSIS — Z1231 Encounter for screening mammogram for malignant neoplasm of breast: Secondary | ICD-10-CM | POA: Diagnosis not present

## 2023-11-13 ENCOUNTER — Telehealth

## 2023-11-13 DIAGNOSIS — N186 End stage renal disease: Secondary | ICD-10-CM | POA: Diagnosis not present

## 2023-11-13 DIAGNOSIS — Z992 Dependence on renal dialysis: Secondary | ICD-10-CM | POA: Diagnosis not present

## 2023-11-15 DIAGNOSIS — N186 End stage renal disease: Secondary | ICD-10-CM | POA: Diagnosis not present

## 2023-11-15 DIAGNOSIS — Z992 Dependence on renal dialysis: Secondary | ICD-10-CM | POA: Diagnosis not present

## 2023-11-16 DIAGNOSIS — N186 End stage renal disease: Secondary | ICD-10-CM | POA: Diagnosis not present

## 2023-11-16 DIAGNOSIS — Z992 Dependence on renal dialysis: Secondary | ICD-10-CM | POA: Diagnosis not present

## 2023-11-18 DIAGNOSIS — N186 End stage renal disease: Secondary | ICD-10-CM | POA: Diagnosis not present

## 2023-11-18 DIAGNOSIS — Z992 Dependence on renal dialysis: Secondary | ICD-10-CM | POA: Diagnosis not present

## 2023-11-20 DIAGNOSIS — Z992 Dependence on renal dialysis: Secondary | ICD-10-CM | POA: Diagnosis not present

## 2023-11-20 DIAGNOSIS — N186 End stage renal disease: Secondary | ICD-10-CM | POA: Diagnosis not present

## 2023-11-22 DIAGNOSIS — N186 End stage renal disease: Secondary | ICD-10-CM | POA: Diagnosis not present

## 2023-11-22 DIAGNOSIS — Z992 Dependence on renal dialysis: Secondary | ICD-10-CM | POA: Diagnosis not present

## 2023-11-25 DIAGNOSIS — Z992 Dependence on renal dialysis: Secondary | ICD-10-CM | POA: Diagnosis not present

## 2023-11-25 DIAGNOSIS — N186 End stage renal disease: Secondary | ICD-10-CM | POA: Diagnosis not present

## 2023-11-26 ENCOUNTER — Other Ambulatory Visit: Payer: Self-pay | Admitting: Internal Medicine

## 2023-11-26 ENCOUNTER — Other Ambulatory Visit (INDEPENDENT_AMBULATORY_CARE_PROVIDER_SITE_OTHER): Payer: Self-pay | Admitting: Vascular Surgery

## 2023-11-26 ENCOUNTER — Telehealth: Payer: Self-pay

## 2023-11-26 DIAGNOSIS — N186 End stage renal disease: Secondary | ICD-10-CM

## 2023-11-26 NOTE — Telephone Encounter (Signed)
 Last filled 10-24-23 #60 Last OV 08-08-23 Next OV 01-28-24 [facility]

## 2023-11-26 NOTE — Telephone Encounter (Signed)
 Left message to call office

## 2023-11-26 NOTE — Telephone Encounter (Signed)
 Carrie Charlie FERNS, MD  [name], CMA Please find out if she is still having the trouble swallowing and I can make referral to [facility]       Previous Messages    ----- Message ----- From: Claudene Imam Sent: 11/26/2023  11:42 AM EDT To: Charlie [name] Jimmy, MD Subject: Unable to schedule appointment                Thank you for your referral to [facility]. Unfortunately, we currently do not have any providers at [facility] to see patients for problems in the clinic. Please refer this patient to either  GI, [place] or to [facility] here in [place] if the patient would like to stay closer. We are in the process of recruiting and will hopefully be seeing patients soon. We are still doing screening colonoscopies so if you have a patient that just needs a screening or a repeat from years ago, we will be happy to take care of them. If your patient can wait until we have a provider in the clinic, we will hold their referral in our que and contact them once we have a schedule open. We apologize for this inconvenience.   Thank you!   [employer] risk surveyor

## 2023-11-26 NOTE — Telephone Encounter (Signed)
 Copied from CRM [id number]. Topic: Clinical - Medical Advice >> Nov 26, 2023  2:25 PM [place] A wrote: Reason for CRM: Patient said that she does not want the referral for Upper GI

## 2023-11-27 DIAGNOSIS — Z992 Dependence on renal dialysis: Secondary | ICD-10-CM | POA: Diagnosis not present

## 2023-11-27 DIAGNOSIS — N186 End stage renal disease: Secondary | ICD-10-CM | POA: Diagnosis not present

## 2023-11-28 ENCOUNTER — Ambulatory Visit (INDEPENDENT_AMBULATORY_CARE_PROVIDER_SITE_OTHER): Admitting: Nurse Practitioner

## 2023-11-28 ENCOUNTER — Ambulatory Visit (INDEPENDENT_AMBULATORY_CARE_PROVIDER_SITE_OTHER)

## 2023-11-28 ENCOUNTER — Encounter (INDEPENDENT_AMBULATORY_CARE_PROVIDER_SITE_OTHER): Payer: Self-pay | Admitting: Nurse Practitioner

## 2023-11-28 VITALS — BP 147/72 | HR 62 | Ht 62.5 in | Wt 213.6 lb

## 2023-11-28 DIAGNOSIS — I10 Essential (primary) hypertension: Secondary | ICD-10-CM

## 2023-11-28 DIAGNOSIS — N186 End stage renal disease: Secondary | ICD-10-CM

## 2023-11-28 DIAGNOSIS — E785 Hyperlipidemia, unspecified: Secondary | ICD-10-CM

## 2023-11-28 DIAGNOSIS — Z992 Dependence on renal dialysis: Secondary | ICD-10-CM

## 2023-11-28 NOTE — Progress Notes (Signed)
 Subjective:    Patient ID: Carrie [name], female    DOB: [birth year], 73 y.o.   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   Following the intervention the access function has significantly improved, with better flow rates and improved KT/V. The patient has not been experiencing increased bleeding times following decannulation and the patient denies increased recirculation.  Initially postprocedure she had some arm swelling but that has resolved.  At the present time the patient denies hand pain.  No recent shortening of the patient's walking distance or new symptoms consistent with claudication.  No history of rest pain symptoms. No new ulcers or wounds of the lower extremities have occurred.  The patient denies amaurosis fugax or recent TIA symptoms. There are no recent neurological changes noted. There is no history of DVT, PE or superficial thrombophlebitis. No recent episodes of angina or shortness of breath documented.   Duplex ultrasound of the AV access shows a patent access.  The previously noted stenosis is improved compared to last study.  Flow volume today is 1805 cc/min (previous flow volume was 1775 cc/min)       Review of Systems  Hematological:  Does not bruise/bleed easily.  All other systems reviewed and are negative.      Objective:   Physical Exam Vitals reviewed.  HENT:     Head: Normocephalic.   Cardiovascular:     Rate and Rhythm: Normal rate.     Pulses: Normal pulses.          Radial pulses are 2+ on the left side.     Arteriovenous access: Left arteriovenous access is present.     Comments: Good thrill and bruit Pulmonary:     Effort: Pulmonary effort is normal.    Skin:    General: Skin is warm and dry.   Neurological:     Mental Status: She is alert and oriented to person, place, and time.   Psychiatric:        Mood and Affect: Mood normal.        Behavior: Behavior normal.        Thought Content: Thought content normal.        Judgment: Judgment normal.     BP (!) 147/72   Pulse 62   Ht 5' 2.5 (1.588 m)   Wt 213 lb 9.6 oz (96.9 kg)   BMI 38.45 kg/m   Past Medical History:  Diagnosis Date   Allergy    Anemia    Arthritis    right knee (10/26'/2017)   Chronic heart failure with preserved ejection fraction (HFpEF) (HCC)    a. 04/2021 Echo: EF >55%, GrI DD, nl RV fxn, triv MR; b. 12/2021 Echo: EF 60-65%, no rwma.   Demand ischemia (HCC)    a. 04/2021 HsTrop up to 73 in setting of volume overload; b. 10/2021 MV: EF 66% with small, mild reversible defect in the apical septal and mid anteroseptal myocardium which was felt to most likely represent artifact.   Depression    ESRD (end stage renal disease) (HCC)    a. OnHD (Dr. Woodward Brought)   History of blood transfusion 2008   when I had  brain tumor surgery   History of MRSA infection 2008   History of stress test    a. 07/2018 MV: EF 66%, no ischemia/infarct.   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 12/2021 Echo: EF 60-65%, no rwma, mild LVH, nl RV fxn, small pericardial effusion - small pocket of post wall, ~ 1cm.   Precordial chest pain    a. 10/2021 MV: low risk.   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      No living will   Requests niece, June Sorrel, as health care POA   Would accept resuscitation attempts   No tube feeds if cognitively aware   Social Drivers of Health   Financial Resource Strain: Medium Risk (08/06/2023)   Overall Financial Resource Strain (CARDIA)    Difficulty of Paying Living Expenses: Somewhat hard  Food Insecurity: Food Insecurity Present (08/06/2023)   Hunger Vital Sign    Worried About Running Out of Food in the Last Year: Sometimes true    Ran Out of Food in the Last Year: Often true  Transportation Needs: No Transportation Needs (08/06/2023)   PRAPARE - Administrator, Civil Service (Medical): No    Lack of Transportation (Non-Medical): No  Physical Activity: Unknown (08/06/2023)   Exercise Vital Sign    Days of Exercise per Week: Patient declined    Minutes of Exercise per Session: 10 min  Stress: No Stress Concern Present (08/06/2023)   [employer] of Occupational Health - Occupational Stress Questionnaire    Feeling of Stress : Not at all  Social Connections: Moderately Integrated (08/06/2023)   Social Connection and Isolation Panel    Frequency of Communication with Friends and Family: Twice a week    Frequency of Social Gatherings with Friends and Family: More than three times a week    Attends Religious Services: More than 4 times per year    Active Member of [employer] or Organizations: Yes    Attends Banker Meetings: Never    Marital Status: Widowed  Intimate Partner Violence: Not At Risk (03/13/2022)   Humiliation, Afraid, Rape, and Kick questionnaire    Fear of Current or Ex-Partner: No     Emotionally Abused: No    Physically Abused: No    Sexually Abused: No    Past Surgical History:  Procedure Laterality Date   A/V FISTULAGRAM Left 03/10/2019   Procedure: A/V FISTULAGRAM;  Surgeon: Jama Cordella MATSU, MD;  Location: [facility];  Service: Cardiovascular;  Laterality: Left;  A/V FISTULAGRAM Left 07/14/2019   Procedure: A/V FISTULAGRAM;  Surgeon: Jama Cordella MATSU, MD;  Location: [facility];  Service: Cardiovascular;  Laterality: Left;   A/V FISTULAGRAM Left 01/07/2020   Procedure: A/V FISTULAGRAM;  Surgeon: Jama Cordella MATSU, MD;  Location: [facility];  Service: Cardiovascular;  Laterality: Left;   A/V FISTULAGRAM Left 11/14/2021   Procedure: A/V Fistulagram;  Surgeon: Jama Cordella MATSU, MD;  Location: [facility];  Service: Cardiovascular;  Laterality: Left;   A/V SHUNTOGRAM Left 10/17/2023   Procedure: A/V SHUNTOGRAM;  Surgeon: Marea Selinda RAMAN, MD;  Location: [facility];  Service: Cardiovascular;  Laterality: Left;   AV FISTULA PLACEMENT Left 11/07/2018   Procedure: ARTERIOVENOUS (AV) FISTULA CREATION ( BRACHIO BASILIC ) VS BRACHIAL AXILLARY GRAFT;  Surgeon: Jama Cordella MATSU, MD;  Location: [facility];  Service: Vascular;  Laterality: Left;   BRAIN MENINGIOMA EXCISION  08/2006   Foramina magnum meningioma   CERVICAL DISCECTOMY  1996   Dr. Yves   CESAREAN SECTION  1981   COLONOSCOPY     COLONOSCOPY WITH PROPOFOL  N/A 03/07/2021   Procedure: COLONOSCOPY WITH PROPOFOL ;  Surgeon: Janalyn Keene NOVAK, MD;  Location: [facility];  Service: Endoscopy;  Laterality: N/A;   DIALYSIS/PERMA CATHETER INSERTION N/A 08/04/2018   Procedure: DIALYSIS/PERMA CATHETER INSERTION;  Surgeon: Marea Selinda RAMAN, MD;  Location: [facility];  Service: Cardiovascular;  Laterality: N/A;   DIALYSIS/PERMA CATHETER REMOVAL N/A 12/25/2018   Procedure: DIALYSIS/PERMA CATHETER REMOVAL;  Surgeon: Marea Selinda RAMAN, MD;  Location: [facility];   Service: Cardiovascular;  Laterality: N/A;   DILATION AND CURETTAGE OF UTERUS     JOINT REPLACEMENT     right knee left hip   MULTIPLE TOOTH EXTRACTIONS  01/30/2023   Right wrist x-ray  2007   Deg. at 1st MCP   TOTAL HIP ARTHROPLASTY Left 03/27/2016   Procedure: LEFT TOTAL HIP ARTHROPLASTY ANTERIOR APPROACH;  Surgeon: Lonni CINDERELLA Poli, MD;  Location: [facility] OR;  Service: Orthopedics;  Laterality: Left;   TOTAL KNEE ARTHROPLASTY Right 04/30/2017   Procedure: RIGHT TOTAL KNEE ARTHROPLASTY;  Surgeon: Poli Lonni CINDERELLA, MD;  Location: [facility] OR;  Service: Orthopedics;  Laterality: Right;   TOTAL KNEE ARTHROPLASTY Left 03/13/2022   Procedure: LEFT TOTAL KNEE ARTHROPLASTY;  Surgeon: Poli Lonni CINDERELLA, MD;  Location: [facility] OR;  Service: Orthopedics;  Laterality: Left;   TUBAL LIGATION  1981    Family History  Problem Relation Age of Onset   Hypertension Other        Strong family history    Breast cancer Mother 21   Heart failure Sister    Heart disease Sister     Allergies  Allergen Reactions   Naproxen Sodium Anaphylaxis and Swelling   Sulfamethoxazole-Trimethoprim Anaphylaxis and Swelling       Latest Ref Rng & Units 12/31/2022    7:51 AM 12/30/2022    1:28 PM 12/29/2022    2:03 PM  CBC  WBC 4.0 - 10.5 K/uL 4.7  4.7  6.0   Hemoglobin 12.0 - 15.0 g/dL 89.7  88.3  89.3   Hematocrit 36.0 - 46.0 % 31.8  35.4  31.5   Platelets 150 - 400 K/uL 158  163  162       CMP     Component Value Date/Time   NA 134 (L) 12/31/2022 0751   K 3.8 12/31/2022 0751   CL 96 (L) 12/31/2022 0751   CO2 26 12/31/2022 0751  GLUCOSE 99 12/31/2022 0751   BUN 33 (H) 12/31/2022 0751   BUN 36 (A) 07/08/2017 0000   CREATININE 7.51 (H) 12/31/2022 0751   CALCIUM  8.6 (L) 12/31/2022 0751   PROT 7.3 05/02/2021 0524   ALBUMIN  3.7 12/31/2022 0751   AST 14 (L) 05/02/2021 0524   ALT 12 05/02/2021 0524   ALKPHOS 95 05/02/2021 0524   BILITOT 0.7 05/02/2021 0524   GFR 16.03 (L) 12/20/2017 1145    GFRNONAA 5 (L) 12/31/2022 0751     No results found.     Assessment & Plan:   1. ESRD on hemodialysis (HCC) (Primary) Recommend:  The patient is doing well and currently has adequate dialysis access. The patient's dialysis center is not reporting any access issues. Flow pattern is stable when compared to the prior ultrasound.  The patient should have a duplex ultrasound of the dialysis access in 6 months. The patient will follow-up with me in the office after each ultrasound    2. Hyperlipidemia, unspecified hyperlipidemia type Continue statin as ordered and reviewed, no changes at this time  3. Essential hypertension Continue antihypertensive medications as already ordered, these medications have been reviewed and there are no changes at this time.   Current Outpatient Medications on File Prior to Visit  Medication Sig Dispense Refill   acetaminophen  (TYLENOL ) 500 MG tablet Take 1,000 mg by mouth every 6 (six) hours as needed for mild pain or moderate pain.     albuterol  (VENTOLIN  HFA) 108 (90 Base) MCG/ACT inhaler Inhale 2 puffs into the lungs every 6 (six) hours as needed for wheezing or shortness of breath. 8 g 2   amLODipine  (NORVASC ) 10 MG tablet Take 10 mg by mouth daily.     benzonatate  (TESSALON ) 200 MG capsule Take 1 capsule (200 mg total) by mouth 3 (three) times daily as needed for cough. 60 capsule 0   hydrALAZINE  (APRESOLINE ) 25 MG tablet TAKE 1 TABLET BY MOUTH THREE TIMES A DAY 270 tablet 1   levETIRAcetam  (KEPPRA ) 500 MG tablet Take 1 tablet (500 mg total) by mouth 2 (two) times daily. 180 tablet 3   lidocaine -prilocaine  (EMLA ) cream Apply 1 application topically every Monday, Wednesday, and Friday with hemodialysis.     LORazepam  (ATIVAN ) 0.5 MG tablet TAKE 1 TABLET BY MOUTH TWICE A DAY AS NEEDED FOR ANXIETY 60 tablet 0   metoprolol  succinate (TOPROL -XL) 50 MG 24 hr tablet Take 50 mg by mouth daily.     multivitamin (RENA-VIT) TABS tablet Take 1 tablet by mouth  at bedtime. 30 tablet 0   omeprazole  (PRILOSEC) 20 MG capsule Take 1 capsule (20 mg total) by mouth in the morning and at bedtime. 180 capsule 3   ondansetron  (ZOFRAN -ODT) 4 MG disintegrating tablet Take 4 mg by mouth every 8 (eight) hours as needed for nausea or vomiting.     sevelamer  carbonate (RENVELA ) 800 MG tablet Take 800-1,600 mg by mouth See admin instructions. Take 2 tablets (1600 mg) by mouth three times daily with meals and take 1 tablet (800 mg) by mouth twice daily with snacks     torsemide  (DEMADEX ) 20 MG tablet Take 20 mg by mouth daily. Every day (including on dialysis days)     atorvastatin  (LIPITOR) 40 MG tablet TAKE 40 MG BY MOUTH ONCE DAILY (Patient not taking: Reported on 11/28/2023) 90 tablet 3   No current facility-administered medications on file prior to visit.    There are no Patient Instructions on file for this visit. No follow-ups on file.  [place] Kinnett E Larell Baney, NP

## 2023-11-29 DIAGNOSIS — Z992 Dependence on renal dialysis: Secondary | ICD-10-CM | POA: Diagnosis not present

## 2023-11-29 DIAGNOSIS — N186 End stage renal disease: Secondary | ICD-10-CM | POA: Diagnosis not present

## 2023-12-02 ENCOUNTER — Ambulatory Visit (INDEPENDENT_AMBULATORY_CARE_PROVIDER_SITE_OTHER): Admitting: Internal Medicine

## 2023-12-02 ENCOUNTER — Encounter: Payer: Self-pay | Admitting: Internal Medicine

## 2023-12-02 ENCOUNTER — Ambulatory Visit: Payer: Self-pay

## 2023-12-02 VITALS — BP 130/66 | HR 70 | Temp 97.7°F | Ht 62.5 in | Wt 216.0 lb

## 2023-12-02 DIAGNOSIS — R42 Dizziness and giddiness: Secondary | ICD-10-CM | POA: Insufficient documentation

## 2023-12-02 DIAGNOSIS — N186 End stage renal disease: Secondary | ICD-10-CM | POA: Diagnosis not present

## 2023-12-02 DIAGNOSIS — Z992 Dependence on renal dialysis: Secondary | ICD-10-CM | POA: Diagnosis not present

## 2023-12-02 MED ORDER — MECLIZINE HCL 12.5 MG PO TABS: 12.5000 mg | ORAL_TABLET | Freq: Three times a day (TID) | ORAL | 0 refills | Status: DC | PRN

## 2023-12-02 NOTE — Assessment & Plan Note (Signed)
 Seems vestibular May be related to fluid gain from prednisone  Will go back to dialysis on Wednesday Nothing to suggest stroke or TIA Limit salt now Will Rx meclizine to try at dialysis if she doesn't feel right

## 2023-12-02 NOTE — Telephone Encounter (Signed)
  FYI Only or Action Required?: FYI only for provider.  Patient was last seen in primary care on 08/08/2023 by Jimmy Charlie FERNS, MD. Called Nurse Triage reporting Dizziness. Symptoms began today. Interventions attempted: Nothing. Symptoms are: unchanged.  Triage Disposition: See Physician Within 24 Hours  Patient/caregiver understands and will follow disposition?: Yes      Copied from CRM [id number]. Topic: Clinical - Red Word Triage >> Dec 02, 2023  8:48 AM Carrie [name] wrote: [facility] that prompted transfer to Nurse Triage: Was on her dialysis treatment.. they stopped it sooner since she got dizzy.. can barely walk Reason for Disposition  [1] MODERATE dizziness (e.[name]., vertigo; feels very unsteady, interferes with normal activities) AND [2] has NOT been evaluated by doctor (or NP/PA) for this  Answer Assessment - Initial Assessment Questions 1. DESCRIPTION: Describe your dizziness.     Became dizzy during dialysis, at home now.  States bp was dropping low, did not get reading; states not dizzy now 2. VERTIGO: Do you feel like either you or the room is spinning or tilting?      Spinning and tilting 3. LIGHTHEADED: Do you feel lightheaded? (e.[name]., somewhat faint, woozy, weak upon standing)     Hx dizziness, woozy 4. SEVERITY: How bad is it?  Can you walk?   - MILD: Feels slightly dizzy and unsteady, but is walking normally.   - MODERATE: Feels unsteady when walking, but not falling; interferes with normal activities (e.[name]., school, work).   - SEVERE: Unable to walk without falling, or requires assistance to walk without falling.     Severe, had to have help to walk out of dialysis, also had dizziness on Saturday 5. ONSET:  When did the dizziness begin?     Started again today 6. AGGRAVATING FACTORS: Does anything make it worse? (e.[name]., standing, change in head position)     dialysis 7. CAUSE: What do you think is causing the dizziness?     dialysis 8. RECURRENT SYMPTOM: Have  you had dizziness before? If Yes, ask: When was the last time? What happened that time?     yes 9. OTHER SYMPTOMS: Do you have any other symptoms? (e.[name]., headache, weakness, numbness, vomiting, earache)     headache 10. PREGNANCY: Is there any chance you are pregnant? When was your last menstrual period?       na  Protocols used: Dizziness - Vertigo-A-AH

## 2023-12-02 NOTE — Progress Notes (Signed)
 Subjective:    Patient ID: Carrie [name], female    DOB: 1950-11-08, 73 y.o.   MRN: [id number]  HPI Here due to dizziness and feeling bad on dialysis  Has been feeling off balance yesterday Needed help walking Nothing focal to suggest stroke--just trouble  Went to dialysis and felt bad---off balance and then head hurting Right fingers started cramping while holding down over dialysis site (after they took her off)--finished about half of the session  Was put on prednisone  by Dr Rennie bad cough last week Eating a lot since then---and didn't feel right--so she stopped it after a few days  The cough is better  Sleeps in recliner--no symptoms there No true rotatory symptoms Vision blurred at dialysis--improved off dialysis  No aphasia No facial droop No focal weakness  Current Outpatient Medications on File Prior to Visit  Medication Sig Dispense Refill   acetaminophen  (TYLENOL ) 500 MG tablet Take 1,000 mg by mouth every 6 (six) hours as needed for mild pain or moderate pain.     albuterol  (VENTOLIN  HFA) 108 (90 Base) MCG/ACT inhaler Inhale 2 puffs into the lungs every 6 (six) hours as needed for wheezing or shortness of breath. 8 g 2   amLODipine  (NORVASC ) 10 MG tablet Take 10 mg by mouth daily.     atorvastatin  (LIPITOR) 40 MG tablet TAKE 40 MG BY MOUTH ONCE DAILY 90 tablet 3   benzonatate  (TESSALON ) 200 MG capsule Take 1 capsule (200 mg total) by mouth 3 (three) times daily as needed for cough. 60 capsule 0   hydrALAZINE  (APRESOLINE ) 25 MG tablet TAKE 1 TABLET BY MOUTH THREE TIMES A DAY 270 tablet 1   levETIRAcetam  (KEPPRA ) 500 MG tablet Take 1 tablet (500 mg total) by mouth 2 (two) times daily. 180 tablet 3   lidocaine -prilocaine  (EMLA ) cream Apply 1 application topically every Monday, Wednesday, and Friday with hemodialysis.     LORazepam  (ATIVAN ) 0.5 MG tablet TAKE 1 TABLET BY MOUTH TWICE A DAY AS NEEDED FOR ANXIETY 60 tablet 0   metoprolol  succinate (TOPROL -XL) 50  MG 24 hr tablet Take 50 mg by mouth daily.     multivitamin (RENA-VIT) TABS tablet Take 1 tablet by mouth at bedtime. 30 tablet 0   omeprazole  (PRILOSEC) 20 MG capsule Take 1 capsule (20 mg total) by mouth in the morning and at bedtime. 180 capsule 3   ondansetron  (ZOFRAN -ODT) 4 MG disintegrating tablet Take 4 mg by mouth every 8 (eight) hours as needed for nausea or vomiting.     predniSONE  (DELTASONE ) 10 MG tablet Take 30 mg by mouth daily.     sevelamer  carbonate (RENVELA ) 800 MG tablet Take 800-1,600 mg by mouth See admin instructions. Take 2 tablets (1600 mg) by mouth three times daily with meals and take 1 tablet (800 mg) by mouth twice daily with snacks     torsemide  (DEMADEX ) 20 MG tablet Take 20 mg by mouth daily. Every day (including on dialysis days)     No current facility-administered medications on file prior to visit.    Allergies  Allergen Reactions   Naproxen Sodium Anaphylaxis and Swelling   Sulfamethoxazole-Trimethoprim Anaphylaxis and Swelling    Past Medical History:  Diagnosis Date   Allergy    Anemia    Arthritis    right knee (10/26'/2017)   Chronic heart failure with preserved ejection fraction (HFpEF) (HCC)    a. 04/2021 Echo: EF >55%, GrI DD, nl RV fxn, triv MR; b. 12/2021 Echo: EF 60-65%, no rwma.  Demand ischemia (HCC)    a. 04/2021 HsTrop up to 73 in setting of volume overload; b. 10/2021 MV: EF 66% with small, mild reversible defect in the apical septal and mid anteroseptal myocardium which was felt to most likely represent artifact.   Depression    ESRD (end stage renal disease) (HCC)    a. OnHD (Dr. 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 07/2018 MV: EF 66%, no ischemia/infarct.   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 12/2021 Echo: EF 60-65%, no rwma, mild LVH, nl RV fxn, small pericardial  effusion - small pocket of post wall, ~ 1cm.   Precordial chest pain    a. 10/2021 MV: low risk.   Type II diabetes mellitus (HCC) 2011   on metformin  until yr ago- no med now - off due to kidneys    Past Surgical History:  Procedure Laterality Date   A/V FISTULAGRAM Left 03/10/2019   Procedure: A/V FISTULAGRAM;  Surgeon: Jama Cordella MATSU, MD;  Location: [facility];  Service: Cardiovascular;  Laterality: Left;   A/V FISTULAGRAM Left 07/14/2019   Procedure: A/V FISTULAGRAM;  Surgeon: Jama Cordella MATSU, MD;  Location: [facility];  Service: Cardiovascular;  Laterality: Left;   A/V FISTULAGRAM Left 01/07/2020   Procedure: A/V FISTULAGRAM;  Surgeon: Jama Cordella MATSU, MD;  Location: [facility];  Service: Cardiovascular;  Laterality: Left;   A/V FISTULAGRAM Left 11/14/2021   Procedure: A/V Fistulagram;  Surgeon: Jama Cordella MATSU, MD;  Location: [facility];  Service: Cardiovascular;  Laterality: Left;   A/V SHUNTOGRAM Left 10/17/2023   Procedure: A/V SHUNTOGRAM;  Surgeon: Marea Selinda RAMAN, MD;  Location: [facility];  Service: Cardiovascular;  Laterality: Left;   AV FISTULA PLACEMENT Left 11/07/2018   Procedure: ARTERIOVENOUS (AV) FISTULA CREATION ( BRACHIO BASILIC ) VS BRACHIAL AXILLARY GRAFT;  Surgeon: Jama Cordella MATSU, MD;  Location: [facility];  Service: Vascular;  Laterality: Left;   BRAIN MENINGIOMA EXCISION  08/2006   Foramina magnum meningioma   CERVICAL DISCECTOMY  1996   Dr. Yves   CESAREAN SECTION  1981   COLONOSCOPY     COLONOSCOPY WITH PROPOFOL  N/A 03/07/2021   Procedure: COLONOSCOPY WITH PROPOFOL ;  Surgeon: Janalyn Keene NOVAK, MD;  Location: [facility];  Service: Endoscopy;  Laterality: N/A;   DIALYSIS/PERMA CATHETER INSERTION N/A 08/04/2018   Procedure: DIALYSIS/PERMA CATHETER INSERTION;  Surgeon: Marea Selinda RAMAN, MD;  Location: [facility];  Service: Cardiovascular;  Laterality: N/A;   DIALYSIS/PERMA CATHETER  REMOVAL N/A 12/25/2018   Procedure: DIALYSIS/PERMA CATHETER REMOVAL;  Surgeon: Marea Selinda RAMAN, MD;  Location: [facility];  Service: Cardiovascular;  Laterality: N/A;   DILATION AND CURETTAGE OF UTERUS     JOINT REPLACEMENT     right knee left hip   MULTIPLE TOOTH EXTRACTIONS  01/30/2023   Right wrist x-ray  2007   Deg. at 1st MCP   TOTAL HIP ARTHROPLASTY Left 03/27/2016   Procedure: LEFT TOTAL HIP ARTHROPLASTY ANTERIOR APPROACH;  Surgeon: Lonni CINDERELLA Poli, MD;  Location: [facility];  Service: Orthopedics;  Laterality: Left;   TOTAL KNEE ARTHROPLASTY Right 04/30/2017   Procedure: RIGHT TOTAL KNEE ARTHROPLASTY;  Surgeon: Poli Lonni CINDERELLA, MD;  Location: [facility];  Service: Orthopedics;  Laterality: Right;   TOTAL KNEE ARTHROPLASTY Left 03/13/2022   Procedure: LEFT TOTAL KNEE ARTHROPLASTY;  Surgeon: Vernetta Lonni GRADE, MD;  Location: [facility];  Service: Orthopedics;  Laterality: Left;   TUBAL LIGATION  1981    Family History  Problem Relation Age of Onset   Hypertension Other        Strong family history    Breast cancer Mother 63   Heart failure Sister    Heart disease Sister     Social History   Socioeconomic History   Marital status: Widowed    Spouse name: Not on file   Number of children: 1   Years of education: Not on file   Highest education level: 12th grade  Occupational History   Occupation: Formerly Chief Operating Officer, 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      No living will   Requests niece, June Sorrel, as health care POA   Would accept  resuscitation attempts   No tube feeds if cognitively aware   Social Drivers of Health   Financial Resource Strain: High Risk (12/02/2023)   Overall Financial Resource Strain (CARDIA)    Difficulty of Paying Living Expenses: Very hard  Food Insecurity: Food Insecurity Present (12/02/2023)   Hunger Vital Sign    Worried About Running Out of Food in the Last Year: Sometimes true    Ran Out of Food in the Last Year: Often true  Transportation Needs: No Transportation Needs (12/02/2023)   PRAPARE - Administrator, Civil Service (Medical): No    Lack of Transportation (Non-Medical): No  Physical Activity: Inactive (12/02/2023)   Exercise Vital Sign    Days of Exercise per Week: 0 days    Minutes of Exercise per Session: Not on file  Stress: No Stress Concern Present (12/02/2023)   [employer] of Occupational Health - Occupational Stress Questionnaire    Feeling of Stress: Not at all  Social Connections: Moderately Isolated (12/02/2023)   Social Connection and Isolation Panel    Frequency of Communication with Friends and Family: Once a week    Frequency of Social Gatherings with Friends and Family: More than three times a week    Attends Religious Services: More than 4 times per year    Active Member of [employer] or Organizations: No    Attends Banker Meetings: Not on file    Marital Status: Widowed  Intimate Partner Violence: Not At Risk (03/13/2022)   Humiliation, Afraid, Rape, and Kick questionnaire    Fear of Current or Ex-Partner: No    Emotionally Abused: No    Physically Abused: No    Sexually Abused: No   Review of Systems Appetite is back to normal Weight was up more today at dialysis--due to the prednisone     Objective:   Physical Exam HENT:     Mouth/Throat:     Pharynx: No oropharyngeal exudate or posterior oropharyngeal erythema.   Eyes:     Extraocular Movements: Extraocular movements intact.  Pupils: Pupils are equal, round, and  reactive to light.     Comments: Slight horizontal nystagmus with right gaze   Cardiovascular:     Rate and Rhythm: Normal rate and regular rhythm.     Heart sounds: No murmur heard.    No gallop.  Pulmonary:     Effort: Pulmonary effort is normal.     Breath sounds: Normal breath sounds. No wheezing or rales.   Musculoskeletal:     Cervical back: Neck supple.  Lymphadenopathy:     Cervical: No cervical adenopathy.   Neurological:     Comments: Slow but steady gait CN all normal No focal weakness           Assessment & Plan:

## 2023-12-02 NOTE — Telephone Encounter (Signed)
 Noted---I will check her at today's visit

## 2023-12-03 DIAGNOSIS — N186 End stage renal disease: Secondary | ICD-10-CM | POA: Diagnosis not present

## 2023-12-03 DIAGNOSIS — Z992 Dependence on renal dialysis: Secondary | ICD-10-CM | POA: Diagnosis not present

## 2023-12-04 DIAGNOSIS — N186 End stage renal disease: Secondary | ICD-10-CM | POA: Diagnosis not present

## 2023-12-04 DIAGNOSIS — Z992 Dependence on renal dialysis: Secondary | ICD-10-CM | POA: Diagnosis not present

## 2023-12-05 ENCOUNTER — Other Ambulatory Visit: Payer: Self-pay

## 2023-12-05 NOTE — Patient Instructions (Signed)
 Visit Information  Thank you for taking time to visit with me today. Please don't hesitate to contact me if I can be of assistance to you before our next scheduled appointment.  Your next care management appointment is by telephone on 01/02/2024 at 11:00 am  Telephone follow-up in 1 month  Please call the care guide team at [phone] if you need to cancel, schedule, or reschedule an appointment.   Please call the Suicide and Crisis Lifeline: 988 call the [place]  National Suicide Prevention Lifeline: 539-790-2885 or TTY: (463)590-1813 TTY 7088878260) to talk to a trained counselor call 1-800-273-TALK (toll free, 24 hour hotline) go to [facility] [address], [place] [phone]) call 911 if you are experiencing a Mental Health or Behavioral Health Crisis or need someone to talk to.  Nestora Duos, MSN, RN [place]  [facility], [facility] Health RN Care Manager Direct Dial : [phone] Fax: [phone]   Heart Failure Action Plan A heart failure action plan helps you know what to do when you have symptoms of heart failure. Your action plan is a color-coded plan that lists the symptoms to watch for and indicates what actions to take. If you have symptoms in the green zone, you're doing well. If you have symptoms in the yellow zone, you're having problems. If you have symptoms in the red zone, you need medical care right away. Follow the plan that was created by you and your health care provider. Review your plan each time you visit your provider. Green zone These signs mean you're doing well and can continue what you're doing: You don't have new or worsening shortness of breath. You have very little swelling or no new swelling. Your weight is stable (no gain or loss). You have a normal activity level. You don't have chest pain or any other new symptoms. Yellow zone These signs and symptoms mean your condition  may be getting worse and you should make some changes: You have trouble breathing when you're active. You have swelling in your feet or legs or have discomfort in your belly. You gain 2-3 lb (0.9-1.4 kg) in 24 hours, or 5 lb (2.3 kg) in a week. This amount may be more or less depending on your condition. You get tired easily. You have trouble sleeping. You have a dry cough. If you have any of these symptoms: Contact your provider within the next day. Your provider may adjust your medicines. Red zone These signs and symptoms mean you should get medical help right away: You have trouble breathing when resting or cannot lie flat and you need to raise your head to help you breathe. You have a dry cough that's getting worse. You have swelling or pain in your feet or legs or discomfort in your belly that's getting worse. You suddenly gain more than 2-3 lb (0.9-1.4 kg) in 24 hours, or more than 5 lb (2.3 kg) in a week. This amount may be more or less depending on your condition. You have trouble staying awake or you feel confused. You don't have an appetite. You have worsening sadness or depression. These symptoms may be an emergency. Call 911 right away. Do not wait to see if the symptoms will go away. Do not drive yourself to the hospital. Follow these instructions at home: Take medicines only as told. Eat a heart-healthy diet. Work with a dietitian to create an eating plan that's best for you. Weigh yourself each day. Your target weight is __________ lb (__________  kg). Call your provider if you gain more than __________ lb (__________ kg) in 24 hours, or more than __________ lb (__________ kg) in a week. Health care provider name: _____________________________________________________ Health care provider phone number: _____________________________________________________ Where to find more information American Heart Association: heart.org This information is not intended to replace advice  given to you by your health care provider. Make sure you discuss any questions you have with your health care provider. Document Revised: 01/03/2023 Document Reviewed: 01/03/2023 Elsevier Patient Education  2024 [employer].

## 2023-12-05 NOTE — Patient Outreach (Signed)
 Complex Care Management   Visit Note  12/05/2023  Name:  WM FRUCHTER MRN: [id number] DOB: Dec 13, 1950  Situation: Referral received for Complex Care Management related to CHF, HTN  I obtained verbal consent from Patient.  Visit completed with Patient  on the phone  Background:   Past Medical History:  Diagnosis Date   Allergy    Anemia    Arthritis    right knee (10/26'/2017)   Chronic heart failure with preserved ejection fraction (HFpEF) (HCC)    a. 04/2021 Echo: EF >55%, GrI DD, nl RV fxn, triv MR; b. 12/2021 Echo: EF 60-65%, no rwma.   Demand ischemia (HCC)    a. 04/2021 HsTrop up to 73 in setting of volume overload; b. 10/2021 MV: EF 66% with small, mild reversible defect in the apical septal and mid anteroseptal myocardium which was felt to most likely represent artifact.   Depression    ESRD (end stage renal disease) (HCC)    a. OnHD (Dr. 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 07/2018 MV: EF 66%, no ischemia/infarct.   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 12/2021 Echo: EF 60-65%, no rwma, mild LVH, nl RV fxn, small pericardial effusion - small pocket of post wall, ~ 1cm.   Precordial chest pain    a. 10/2021 MV: low risk.   Type II diabetes mellitus (HCC) 2011   on metformin  until yr ago- no med now - off due to kidneys    Assessment: Patient Reported Symptoms:  Cognitive Cognitive Status: Alert and oriented to person, place, and time, Insightful and able to interpret abstract concepts, Normal speech and language skills   Health Maintenance Behaviors: Annual physical exam, Spiritual practice(s), Social activities, Sleep adequate, Immunizations, Healthy diet Healing Pattern: Average Health Facilitated by: Prayer/meditation, Rest, Healthy diet  Neurological Neurological Review of Symptoms: No symptoms  reported Neurological Management Strategies: Routine screening, Medication therapy Neurological Self-Management Outcome: 4 (good) Neurological Comment: HX seizures - denies seizure activity  HEENT HEENT Symptoms Reported: Nasal discharge HEENT Management Strategies: Medication therapy HEENT Comment: runny nose - eallergies, new glasses, recent vertigo, resolved with meclizine    Cardiovascular Cardiovascular Symptoms Reported: No symptoms reported Does patient have uncontrolled Hypertension?: No Cardiovascular Management Strategies: Routine screening, Medication therapy, Weight management, Fluid modification, Diet modification Do You Have a Working Readable Scale?: Yes Weight: 212 lb 1.3 oz (96.2 kg) Cardiovascular Self-Management Outcome: 4 (good)  Respiratory Respiratory Symptoms Reported: No symptoms reported    Endocrine Endocrine Symptoms Reported: No symptoms reported Is patient diabetic?: No    Gastrointestinal Gastrointestinal Symptoms Reported: No symptoms reported Gastrointestinal Management Strategies: Diet modification, Fluid modification Gastrointestinal Comment: 32 oz fluid restriction, low salt/renal diet Nutrition Risk Screen (CP): No indicators present  Genitourinary Genitourinary Symptoms Reported: No symptoms reported Additional Genitourinary Details: still urinates with dialysis Genitourinary Management Strategies: Hemodialysis Hemodialysis Schedule: MWF [facility] Last Treatment: 12/04/23 Genitourinary Self-Management Outcome: 4 (good) Genitourinary Comment: surgery a week ago - balloon to reopen due to to tightening Left upper no signs of infection  Integumentary Integumentary Symptoms Reported: No symptoms reported Skin Management Strategies: Routine screening, Medical device Skin Comment: Left upper arm graft for dialysis  Musculoskeletal Musculoskelatal Symptoms Reviewed: No symptoms reported Musculoskeletal Management Strategies: Routine  screening, Weight management, Diet  modification, Fluid modification, Adequate rest Falls in the past year?: No Number of falls in past year: 1 or less Was there an injury with Fall?: No Fall Risk Category Calculator: 0 Patient Fall Risk Level: Low Fall Risk Patient at Risk for Falls Due to:  (rare dialysis treatment side effects if too much fluid pulled) Fall risk Follow up: Falls evaluation completed, Falls prevention discussed (will get assist as needed from treatment if issues after dialysis)  Psychosocial Psychosocial Symptoms Reported: No symptoms reported Behavioral Management Strategies: Adequate rest, Medication therapy, Support system Behavioral Health Self-Management Outcome: 4 (good) Major Change/Loss/Stressor/Fears (CP): Medical condition, self, Medical condition, family Techniques to Sisters with Loss/Stress/Change: Diversional activities, Medication Quality of Family Relationships: helpful, supportive, involved Do you feel physically threatened by others?: No      12/05/2023   11:29 AM  Depression screen PHQ 2/9  Decreased Interest 0  Down, Depressed, Hopeless 0  PHQ - 2 Score 0    Vitals:   12/05/23 1106  BP: 134/74    Medications Reviewed Today     Reviewed by Devra Lands, RN (Registered Nurse) on 12/05/23 at 1114  Med List Status: <None>   Medication Order Taking? Sig Documenting Provider Last Dose Status Informant  acetaminophen  (TYLENOL ) 500 MG tablet [id number] Yes Take 1,000 mg by mouth every 6 (six) hours as needed for mild pain or moderate pain. [provider]  Active Self  albuterol  (VENTOLIN  HFA) 108 (90 Base) MCG/ACT inhaler [id number]  Inhale 2 puffs into the lungs every 6 (six) hours as needed for wheezing or shortness of breath.  Patient not taking: Reported on 12/05/2023   Fausto Sor A, DO  Active Self  amLODipine  (NORVASC ) 10 MG tablet [id number] Yes Take 10 mg by mouth daily. [provider]  Active Self  atorvastatin   (LIPITOR) 40 MG tablet [id number] Yes TAKE 40 MG BY MOUTH ONCE DAILY Jimmy Charlie FERNS, MD  Active Self  benzonatate  (TESSALON ) 200 MG capsule [id number]  Take 1 capsule (200 mg total) by mouth 3 (three) times daily as needed for cough.  Patient not taking: Reported on 12/05/2023   Letvak, Richard I, MD  Active   hydrALAZINE  (APRESOLINE ) 25 MG tablet [id number] Yes TAKE 1 TABLET BY MOUTH THREE TIMES A DAY Jimmy Charlie I, MD  Active   levETIRAcetam  (KEPPRA ) 500 MG tablet [id number] Yes Take 1 tablet (500 mg total) by mouth 2 (two) times daily. Jimmy Charlie FERNS, MD  Active   lidocaine -prilocaine  (EMLA ) cream [id number] Yes Apply 1 application topically every Monday, Wednesday, and Friday with hemodialysis. [provider]  Active Self  LORazepam  (ATIVAN ) 0.5 MG tablet [id number] Yes TAKE 1 TABLET BY MOUTH TWICE A DAY AS NEEDED FOR ANXIETY Letvak, Richard I, MD  Active   meclizine (ANTIVERT) 12.5 MG tablet [id number] Yes Take 1-2 tablets (12.5-25 mg total) by mouth 3 (three) times daily as needed for dizziness. Letvak, Richard I, MD  Active   metoprolol  succinate (TOPROL -XL) 50 MG 24 hr tablet [id number] Yes Take 50 mg by mouth daily. [provider]  Active Self  multivitamin (RENA-VIT) TABS tablet [id number] Yes Take 1 tablet by mouth at bedtime. Josette Charlie, MD  Active Self  omeprazole  (PRILOSEC) 20 MG capsule [id number] Yes Take 1 capsule (20 mg total) by mouth in the morning and at bedtime. Jimmy Charlie FERNS, MD  Active   ondansetron  (ZOFRAN -ODT) 4 MG disintegrating tablet [id number] Yes Take 4 mg by mouth every 8 (eight) hours as needed for nausea or vomiting. [provider]  Active Self  predniSONE  (DELTASONE ) 10 MG tablet [id number]  Take 30 mg by mouth daily.  Patient not taking: Reported on 12/05/2023   [provider]  Active   sevelamer  carbonate (RENVELA ) 800 MG tablet [id number]  Take 800-1,600 mg by mouth See admin instructions. Take 2 tablets (1600 mg) by  mouth three times daily with meals and take 1 tablet (800 mg) by mouth twice daily with snacks [provider]  Active Self  torsemide  (DEMADEX ) 20 MG tablet [id number] Yes Take 20 mg by mouth daily. Every day (including on dialysis days) [provider]  Active Self            Recommendation:   PCP Follow-up Continue Current Plan of Care  Follow Up Plan:   Telephone follow-up in 1 month  Nestora Duos, MSN, RN [facility]  [facility], [facility] Health RN Care Manager Direct Dial : [phone] Fax: [phone]

## 2023-12-06 DIAGNOSIS — Z992 Dependence on renal dialysis: Secondary | ICD-10-CM | POA: Diagnosis not present

## 2023-12-06 DIAGNOSIS — N186 End stage renal disease: Secondary | ICD-10-CM | POA: Diagnosis not present

## 2023-12-09 DIAGNOSIS — N186 End stage renal disease: Secondary | ICD-10-CM | POA: Diagnosis not present

## 2023-12-09 DIAGNOSIS — Z992 Dependence on renal dialysis: Secondary | ICD-10-CM | POA: Diagnosis not present

## 2023-12-11 DIAGNOSIS — N186 End stage renal disease: Secondary | ICD-10-CM | POA: Diagnosis not present

## 2023-12-11 DIAGNOSIS — Z992 Dependence on renal dialysis: Secondary | ICD-10-CM | POA: Diagnosis not present

## 2023-12-13 ENCOUNTER — Other Ambulatory Visit: Payer: Self-pay | Admitting: Internal Medicine

## 2023-12-13 DIAGNOSIS — Z992 Dependence on renal dialysis: Secondary | ICD-10-CM | POA: Diagnosis not present

## 2023-12-13 DIAGNOSIS — N186 End stage renal disease: Secondary | ICD-10-CM | POA: Diagnosis not present

## 2023-12-16 DIAGNOSIS — N186 End stage renal disease: Secondary | ICD-10-CM | POA: Diagnosis not present

## 2023-12-16 DIAGNOSIS — Z992 Dependence on renal dialysis: Secondary | ICD-10-CM | POA: Diagnosis not present

## 2023-12-18 DIAGNOSIS — Z992 Dependence on renal dialysis: Secondary | ICD-10-CM | POA: Diagnosis not present

## 2023-12-18 DIAGNOSIS — N186 End stage renal disease: Secondary | ICD-10-CM | POA: Diagnosis not present

## 2023-12-20 DIAGNOSIS — N186 End stage renal disease: Secondary | ICD-10-CM | POA: Diagnosis not present

## 2023-12-20 DIAGNOSIS — Z992 Dependence on renal dialysis: Secondary | ICD-10-CM | POA: Diagnosis not present

## 2023-12-23 ENCOUNTER — Other Ambulatory Visit
Admission: RE | Admit: 2023-12-23 | Discharge: 2023-12-23 | Disposition: A | Discharge status: Home / Self Care | Source: Ambulatory Visit | Attending: Nephrology | Admitting: Nephrology

## 2023-12-23 DIAGNOSIS — Z992 Dependence on renal dialysis: Secondary | ICD-10-CM | POA: Diagnosis not present

## 2023-12-23 DIAGNOSIS — N186 End stage renal disease: Secondary | ICD-10-CM | POA: Diagnosis not present

## 2023-12-23 DIAGNOSIS — R0602 Shortness of breath: Secondary | ICD-10-CM | POA: Diagnosis not present

## 2023-12-23 LAB — HEMOGLOBIN: Hemoglobin: 10 g/dL — ABNORMAL LOW (ref 12.0–15.0)

## 2023-12-25 DIAGNOSIS — N186 End stage renal disease: Secondary | ICD-10-CM | POA: Diagnosis not present

## 2023-12-25 DIAGNOSIS — Z992 Dependence on renal dialysis: Secondary | ICD-10-CM | POA: Diagnosis not present

## 2023-12-27 ENCOUNTER — Other Ambulatory Visit: Payer: Self-pay | Admitting: Internal Medicine

## 2023-12-27 DIAGNOSIS — Z992 Dependence on renal dialysis: Secondary | ICD-10-CM | POA: Diagnosis not present

## 2023-12-27 DIAGNOSIS — N186 End stage renal disease: Secondary | ICD-10-CM | POA: Diagnosis not present

## 2023-12-27 NOTE — Telephone Encounter (Signed)
 Last filled 11-26-23 #60 Last OV 12-02-23 Next OV 01-28-24 [facility]

## 2023-12-30 DIAGNOSIS — N186 End stage renal disease: Secondary | ICD-10-CM | POA: Diagnosis not present

## 2023-12-30 DIAGNOSIS — E119 Type 2 diabetes mellitus without complications: Secondary | ICD-10-CM | POA: Diagnosis not present

## 2023-12-30 DIAGNOSIS — Z992 Dependence on renal dialysis: Secondary | ICD-10-CM | POA: Diagnosis not present

## 2024-01-01 DIAGNOSIS — N186 End stage renal disease: Secondary | ICD-10-CM | POA: Diagnosis not present

## 2024-01-01 DIAGNOSIS — Z992 Dependence on renal dialysis: Secondary | ICD-10-CM | POA: Diagnosis not present

## 2024-01-02 ENCOUNTER — Other Ambulatory Visit: Payer: Self-pay

## 2024-01-02 DIAGNOSIS — Z992 Dependence on renal dialysis: Secondary | ICD-10-CM | POA: Diagnosis not present

## 2024-01-02 DIAGNOSIS — N186 End stage renal disease: Secondary | ICD-10-CM | POA: Diagnosis not present

## 2024-01-02 NOTE — Patient Outreach (Signed)
 Complex Care Management   Visit Note  01/02/2024  Name:  Carrie [name] MRN: [id number] DOB: 04-12-1951  Situation: Referral received for Complex Care Management related to Heart Failure and HTN I obtained verbal consent from Patient.  Visit completed with patient  on the phone  Background:   Past Medical History:  Diagnosis Date   Allergy    Anemia    Arthritis    right knee (10/26'/2017)   Chronic heart failure with preserved ejection fraction (HFpEF) (HCC)    a. 04/2021 Echo: EF >55%, GrI DD, nl RV fxn, triv MR; b. 12/2021 Echo: EF 60-65%, no rwma.   Demand ischemia (HCC)    a. 04/2021 HsTrop up to 73 in setting of volume overload; b. 10/2021 MV: EF 66% with small, mild reversible defect in the apical septal and mid anteroseptal myocardium which was felt to most likely represent artifact.   Depression    ESRD (end stage renal disease) (HCC)    a. On[facility] (Dr. 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 07/2018 MV: EF 66%, no ischemia/infarct.   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 12/2021 Echo: EF 60-65%, no rwma, mild LVH, nl RV fxn, small pericardial effusion - small pocket of post wall, ~ 1cm.   Precordial chest pain    a. 10/2021 MV: low risk.   Type II diabetes mellitus (HCC) 2011   on metformin  until yr ago- no med now - off due to kidneys    Assessment: Patient Reported Symptoms:  Cognitive Cognitive Status: No symptoms reported Cognitive/Intellectual Conditions Management [RPT]: None reported or documented in medical history or problem list   Health Maintenance Behaviors: Annual physical exam, Healthy diet, Sleep adequate, Social activities, Spiritual practice(s) Healing Pattern: Average Health Facilitated by: Prayer/meditation  Neurological Neurological Review of Symptoms: No symptoms reported Neurological  Management Strategies: Routine screening, Medication therapy Neurological Comment: no seizure activity  HEENT HEENT Symptoms Reported: No symptoms reported HEENT Management Strategies: Medication therapy, Routine screening    Cardiovascular Cardiovascular Symptoms Reported: No symptoms reported Does patient have uncontrolled Hypertension?: No Cardiovascular Management Strategies: Routine screening, Medication therapy, Weight management, Diet modification, Fluid modification (Fluid restriction 32 oz - follows along with renal diet, aware can take extra binder if needed) Do You Have a Working Readable Scale?: Yes Weight: 211 lb 3.2 oz (95.8 kg) (after dialysis weight)  Respiratory Respiratory Symptoms Reported: No symptoms reported Other Respiratory Symptoms: cough improved with med change, was using O2 2L daily when HGB low - resolved not using O2 - PO2 back up into 90's Respiratory Management Strategies: Oxygen therapy, Routine screening, Medication therapy, Weight management, Fluid modification  Endocrine Endocrine Symptoms Reported: No symptoms reported Is patient diabetic?: No    Gastrointestinal Gastrointestinal Symptoms Reported: No symptoms reported Gastrointestinal Management Strategies: Diet modification, Fluid modification Gastrointestinal Comment: 32 oz fluid restrict and low salt renal diet Nutrition Risk Screen (CP): No indicators present  Genitourinary Genitourinary Symptoms Reported: No symptoms reported Genitourinary Management Strategies: Hemodialysis Hemodialysis Last Treatment: 01/01/24 Genitourinary Comment: no graft issues  Integumentary Integumentary Symptoms Reported: No symptoms reported Skin Management Strategies: Routine screening  Musculoskeletal Musculoskelatal Symptoms Reviewed: No symptoms reported Musculoskeletal Management Strategies: Routine screening, Weight management, Diet modification, Fluid modification Falls in the past year?: No Number of falls in  past year: 1 or less Was there an injury with Fall?: No Fall Risk Category Calculator: 0 Patient Fall Risk Level: Low Fall Risk Fall risk Follow up: Falls evaluation completed, Falls prevention discussed  Psychosocial Psychosocial Symptoms Reported: No symptoms reported Additional Psychological Details: Anxiety managed with medications prn Behavioral Management Strategies: Medication therapy, Support system, Adequate rest Behavioral Health Self-Management Outcome: 4 (good) Major Change/Loss/Stressor/Fears (CP): Medical condition, self, Medical condition, family Techniques to [place] with Loss/Stress/Change: Diversional activities, Meditation, Spiritual practice(s) Quality of Family Relationships: helpful, supportive, involved Do you feel physically threatened by others?: No      01/02/2024   11:24 AM  Depression screen PHQ 2/9  Decreased Interest 0  Down, Depressed, Hopeless 0  PHQ - 2 Score 0    There were no vitals filed for this visit.  Medications Reviewed Today     Reviewed by Devra Lands, RN (Registered Nurse) on 01/02/24 at 1108  Med List Status: <None>   Medication Order Taking? Sig Documenting Provider Last Dose Status Informant  acetaminophen  (TYLENOL ) 500 MG tablet [id number] Yes Take 1,000 mg by mouth every 6 (six) hours as needed for mild pain or moderate pain. [provider]  Active Self  albuterol  (VENTOLIN  HFA) 108 (90 Base) MCG/ACT inhaler [id number]  Inhale 2 puffs into the lungs every 6 (six) hours as needed for wheezing or shortness of breath.  Patient not taking: Reported on 12/05/2023   Fausto Sor A, DO  Active Self  amLODipine  (NORVASC ) 10 MG tablet [id number] Yes Take 10 mg by mouth daily. [provider]  Active Self  atorvastatin  (LIPITOR) 40 MG tablet [id number] Yes TAKE 40 MG BY MOUTH ONCE DAILY Jimmy Charlie FERNS, MD  Active Self  benzonatate  (TESSALON ) 200 MG capsule [id number]  Take 1 capsule (200 mg total) by mouth 3 (three) times  daily as needed for cough.  Patient not taking: Reported on 12/05/2023   Letvak, Richard I, MD  Active   hydrALAZINE  (APRESOLINE ) 25 MG tablet [id number] Yes TAKE 1 TABLET BY MOUTH THREE TIMES A DAY  Patient taking differently: Take 25 mg by mouth in the morning and at bedtime. Patient states provider changed to BID, thought was causing dry cough - no cough with BID   Letvak, Richard I, MD  Active   levETIRAcetam  (KEPPRA ) 500 MG tablet [id number] Yes Take 1 tablet (500 mg total) by mouth 2 (two) times daily. Jimmy Charlie FERNS, MD  Active   lidocaine -prilocaine  (EMLA ) cream [id number] Yes Apply 1 application topically every Monday, Wednesday, and Friday with hemodialysis. [provider]  Active Self  LORazepam  (ATIVAN ) 0.5 MG tablet [id number] Yes TAKE 1 TABLET BY MOUTH TWICE A DAY AS NEEDED FOR ANXIETY Letvak, Richard I, MD  Active   meclizine  (ANTIVERT ) 12.5 MG tablet [id number] Yes TAKE 1-2 TABLETS (12.5-25 MG TOTAL) BY MOUTH 3 (THREE) TIMES DAILY AS NEEDED FOR DIZZINESS. Letvak, Richard I, MD  Active   metoprolol  succinate (TOPROL -XL) 50 MG 24 hr tablet [id number] Yes Take 50 mg by mouth daily. [provider]  Active Self  multivitamin (RENA-VIT) TABS tablet [id number] Yes Take 1 tablet by mouth at bedtime. Josette Charlie, MD  Active Self  omeprazole  (PRILOSEC) 20 MG capsule [id number] Yes Take 1 capsule (20 mg total) by mouth in the morning and at bedtime. Letvak, Richard I, MD  Active   ondansetron  (ZOFRAN -ODT) 4 MG disintegrating tablet [id number] Yes Take 4 mg by mouth every 8 (eight) hours as needed for nausea or vomiting. [provider]  Active Self  predniSONE  (DELTASONE ) 10 MG tablet [id number]  Take 30 mg by mouth daily.  Patient not taking: Reported on 12/05/2023   [provider]  Active   sevelamer  carbonate (RENVELA ) 800 MG tablet [id number] Yes Take 800-1,600 mg by mouth See admin instructions. Take 2 tablets (1600 mg) by mouth three times daily with meals  and take 1 tablet (800 mg) by mouth twice daily with snacks [provider]  Active Self  torsemide  (DEMADEX ) 20 MG tablet [id number] Yes Take 20 mg by mouth daily. Every day (including on dialysis days) [provider]  Active Self            Recommendation:   PCP Follow-up Continue Current Plan of Care Contact insurance to determine additional benefits available Contact dentist for appointment  Follow Up Plan:   Telephone follow-up in 1 month  Nestora Duos, MSN, RN [facility]  [facility], [facility] RN Care Manager Direct Dial : [phone] Fax: [phone]

## 2024-01-02 NOTE — Patient Instructions (Signed)
 Visit Information  Thank you for taking time to visit with me today. Please don't hesitate to contact me if I can be of assistance to you before our next scheduled appointment.  Your next care management appointment is by telephone on 01/30/2024 at 11:00 am   Telephone follow-up in 1 month  Contact insurance to determine additional benefits available. Contact dentist for appointment.  Please call the care guide team at [phone] if you need to cancel, schedule, or reschedule an appointment.   Please call the Suicide and Crisis Lifeline: 988 call the [place]  National Suicide Prevention Lifeline: 249-296-0804 or TTY: 410-499-3360 TTY 561-272-0930) to talk to a trained counselor call 1-800-273-TALK (toll free, 24 hour hotline) go to [facility] [address] [place] [phone]) call 911 if you are experiencing a Mental Health or Behavioral Health Crisis or need someone to talk to.  Nestora Duos, MSN, RN   [facility], [facility] RN Care Manager Direct Dial : [phone] Fax: [phone]  Heart Failure Action Plan A heart failure action plan helps you know what to do when you have symptoms of heart failure. Your action plan is a color-coded plan that lists the symptoms to watch for and indicates what actions to take. If you have symptoms in the green zone, you're doing well. If you have symptoms in the yellow zone, you're having problems. If you have symptoms in the red zone, you need medical care right away. Follow the plan that was created by you and your health care provider. Review your plan each time you visit your provider. Green zone These signs mean you're doing well and can continue what you're doing: You don't have new or worsening shortness of breath. You have very little swelling or no new swelling. Your weight is stable (no gain or loss). You have a normal activity level. You don't have  chest pain or any other new symptoms. Yellow zone These signs and symptoms mean your condition may be getting worse and you should make some changes: You have trouble breathing when you're active. You have swelling in your feet or legs or have discomfort in your belly. You gain 2-3 lb (0.9-1.4 kg) in 24 hours, or 5 lb (2.3 kg) in a week. This amount may be more or less depending on your condition. You get tired easily. You have trouble sleeping. You have a dry cough. If you have any of these symptoms: Contact your provider within the next day. Your provider may adjust your medicines. Red zone These signs and symptoms mean you should get medical help right away: You have trouble breathing when resting or cannot lie flat and you need to raise your head to help you breathe. You have a dry cough that's getting worse. You have swelling or pain in your feet or legs or discomfort in your belly that's getting worse. You suddenly gain more than 2-3 lb (0.9-1.4 kg) in 24 hours, or more than 5 lb (2.3 kg) in a week. This amount may be more or less depending on your condition. You have trouble staying awake or you feel confused. You don't have an appetite. You have worsening sadness or depression. These symptoms may be an emergency. Call 911 right away. Do not wait to see if the symptoms will go away. Do not drive yourself to the hospital. Follow these instructions at home: Take medicines only as told. Eat a heart-healthy diet. Work with a dietitian to create an eating plan that's best for  you. Weigh yourself each day. Your target weight is __________ lb (__________ kg). Call your provider if you gain more than __________ lb (__________ kg) in 24 hours, or more than __________ lb (__________ kg) in a week. Health care provider name: _____________________________________________________ Health care provider phone number: _____________________________________________________ Where to find more  information American Heart Association: heart.org This information is not intended to replace advice given to you by your health care provider. Make sure you discuss any questions you have with your health care provider. Document Revised: 01/03/2023 Document Reviewed: 01/03/2023 Elsevier Patient Education  2024 [employer].  Energy Conservation Techniques  Sit for as many activities as possible. Use slow, smooth movements.  Rushing increases discomfort. Determine the necessity of performing the task.  Simplify those tasks that are necessary.  (Get clothes out of the dryer when they are warm instead of ironing, let dishes air dry, etc.) Take frequent rests both during and between activities.  Avoid repetitive tasks. Pre-plan your activities; try a daily and/or weekly schedule.  Spread out the activities that are most fatiguing (break up cleaning tasks over multiple days). Remember to plan a balance of work, rest and recreation. Consider the best time for each activity.  Do the most exertive task when you have the most energy. Don't carry items if you can push them.  Slide, don't lift. Push, don't pull. Utilize two hands when appropriate. Maintain good posture and use proper body mechanics. Avoid remaining in one position for too long. When lifting, bend at the knees, not at the waist.  Exhale when bending down, inhale when straightening up. Carry objects as close to your body and as near to the center of the pelvis.  11. Avoid wasted body movements (position yourself for the task so that you avoid bending, twisting, etc.                when possible). 12. Select the best working environment.  Consider lighting, ventilation, clothing, and equipment. 13. Organize your storage areas, making the items you use daily convenient.  Store heaviest items at waist            height.  Store frequently used items between shoulders and knee height.  Consider leaving frequently used       items on  countertops.  (You can organize in storage baskets based on time used/purpose). 14. Feelings and emotions can be real causes of fatigue.  Try to avoid unnecessary worry, irritation, or                    frustration.  Avoid stress, it can also be a source of fatigue. 15. Get help from other people for difficult tasks. 16. Explore equipment or items that may be able to do the job for you with greater ease.  (Electric can        openers, blenders, lightweight items for cleaning, etc.)  Fall Prevention in the Home, Adult Falls can cause injuries and can happen to people of all ages. There are many things you can do to make your home safer and to help prevent falls. What actions can I take to prevent falls? General information Use good lighting in all rooms. Make sure to: Replace any light bulbs that burn out. Turn on the lights in dark areas and use night-lights. Keep items that you use often in easy-to-reach places. Lower the shelves around your home if needed. Move furniture so that there are clear paths around it. Do not use  throw rugs or other things on the floor that can make you trip. If any of your floors are uneven, fix them. Add color or contrast paint or tape to clearly mark and help you see: Grab bars or handrails. First and last steps of staircases. Where the edge of each step is. If you use a ladder or stepladder: Make sure that it is fully opened. Do not climb a closed ladder. Make sure the sides of the ladder are locked in place. Have someone hold the ladder while you use it. Know where your pets are as you move through your home. What can I do in the bathroom?     Keep the floor dry. Clean up any water on the floor right away. Remove soap buildup in the bathtub or shower. Buildup makes bathtubs and showers slippery. Use non-skid mats or decals on the floor of the bathtub or shower. Attach bath mats securely with double-sided, non-slip rug tape. If you need to sit down in  the shower, use a non-slip stool. Install grab bars by the toilet and in the bathtub and shower. Do not use towel bars as grab bars. What can I do in the bedroom? Make sure that you have a light by your bed that is easy to reach. Do not use any sheets or blankets on your bed that hang to the floor. Have a firm chair or bench with side arms that you can use for support when you get dressed. What can I do in the kitchen? Clean up any spills right away. If you need to reach something above you, use a step stool with a grab bar. Keep electrical cords out of the way. Do not use floor polish or wax that makes floors slippery. What can I do with my stairs? Do not leave anything on the stairs. Make sure that you have a light switch at the top and the bottom of the stairs. Make sure that there are handrails on both sides of the stairs. Fix handrails that are broken or loose. Install non-slip stair treads on all your stairs if they do not have carpet. Avoid having throw rugs at the top or bottom of the stairs. Choose a carpet that does not hide the edge of the steps on the stairs. Make sure that the carpet is firmly attached to the stairs. Fix carpet that is loose or worn. What can I do on the outside of my home? Use bright outdoor lighting. Fix the edges of walkways and driveways and fix any cracks. Clear paths of anything that can make you trip, such as tools or rocks. Add color or contrast paint or tape to clearly mark and help you see anything that might make you trip as you walk through a door, such as a raised step or threshold. Trim any bushes or trees on paths to your home. Check to see if handrails are loose or broken and that both sides of all steps have handrails. Install guardrails along the edges of any raised decks and porches. Have leaves, snow, or ice cleared regularly. Use sand, salt, or ice melter on paths if you live where there is ice and snow during the winter. Clean up any spills  in your garage right away. This includes grease or oil spills. What other actions can I take? Review your medicines with your doctor. Some medicines can cause dizziness or changes in blood pressure, which increase your risk of falling. Wear shoes that: Have a low heel. Do  not wear high heels. Have rubber bottoms and are closed at the toe. Feel good on your feet and fit well. Use tools that help you move around if needed. These include: Canes. Walkers. Scooters. Crutches. Ask your doctor what else you can do to help prevent falls. This may include seeing a physical therapist to learn to do exercises to move better and get stronger. Where to find more information Centers for Disease Control and Prevention, [employer]: TonerPromos.no [employer] on Aging: BaseRingTones.pl National Institute on Aging: BaseRingTones.pl Contact a doctor if: You are afraid of falling at home. You feel weak, drowsy, or dizzy at home. You fall at home. Get help right away if you: Lose consciousness or have trouble moving after a fall. Have a fall that causes a head injury. These symptoms may be an emergency. Get help right away. Call 911. Do not wait to see if the symptoms will go away. Do not drive yourself to the hospital. This information is not intended to replace advice given to you by your health care provider. Make sure you discuss any questions you have with your health care provider. Document Revised: 01/22/2022 Document Reviewed: 01/22/2022 Elsevier Patient Education  2024 [employer].

## 2024-01-03 DIAGNOSIS — N186 End stage renal disease: Secondary | ICD-10-CM | POA: Diagnosis not present

## 2024-01-03 DIAGNOSIS — Z992 Dependence on renal dialysis: Secondary | ICD-10-CM | POA: Diagnosis not present

## 2024-01-06 DIAGNOSIS — Z992 Dependence on renal dialysis: Secondary | ICD-10-CM | POA: Diagnosis not present

## 2024-01-06 DIAGNOSIS — N186 End stage renal disease: Secondary | ICD-10-CM | POA: Diagnosis not present

## 2024-01-08 DIAGNOSIS — Z992 Dependence on renal dialysis: Secondary | ICD-10-CM | POA: Diagnosis not present

## 2024-01-08 DIAGNOSIS — N186 End stage renal disease: Secondary | ICD-10-CM | POA: Diagnosis not present

## 2024-01-10 DIAGNOSIS — N186 End stage renal disease: Secondary | ICD-10-CM | POA: Diagnosis not present

## 2024-01-10 DIAGNOSIS — Z992 Dependence on renal dialysis: Secondary | ICD-10-CM | POA: Diagnosis not present

## 2024-01-13 DIAGNOSIS — N186 End stage renal disease: Secondary | ICD-10-CM | POA: Diagnosis not present

## 2024-01-13 DIAGNOSIS — Z992 Dependence on renal dialysis: Secondary | ICD-10-CM | POA: Diagnosis not present

## 2024-01-15 DIAGNOSIS — Z992 Dependence on renal dialysis: Secondary | ICD-10-CM | POA: Diagnosis not present

## 2024-01-15 DIAGNOSIS — N186 End stage renal disease: Secondary | ICD-10-CM | POA: Diagnosis not present

## 2024-01-17 DIAGNOSIS — N186 End stage renal disease: Secondary | ICD-10-CM | POA: Diagnosis not present

## 2024-01-17 DIAGNOSIS — Z992 Dependence on renal dialysis: Secondary | ICD-10-CM | POA: Diagnosis not present

## 2024-01-20 DIAGNOSIS — N186 End stage renal disease: Secondary | ICD-10-CM | POA: Diagnosis not present

## 2024-01-20 DIAGNOSIS — Z992 Dependence on renal dialysis: Secondary | ICD-10-CM | POA: Diagnosis not present

## 2024-01-22 DIAGNOSIS — N186 End stage renal disease: Secondary | ICD-10-CM | POA: Diagnosis not present

## 2024-01-22 DIAGNOSIS — Z992 Dependence on renal dialysis: Secondary | ICD-10-CM | POA: Diagnosis not present

## 2024-01-23 ENCOUNTER — Ambulatory Visit (INDEPENDENT_AMBULATORY_CARE_PROVIDER_SITE_OTHER)

## 2024-01-23 VITALS — Ht 62.5 in | Wt 211.0 lb

## 2024-01-23 DIAGNOSIS — Z0000 Encounter for general adult medical examination without abnormal findings: Secondary | ICD-10-CM

## 2024-01-23 NOTE — Progress Notes (Signed)
 Subjective:   Carrie [name] is a 73 y.o. who presents for a Medicare Wellness preventive visit.  As a reminder, Annual Wellness Visits don't include a physical exam, and some assessments may be limited, especially if this visit is performed virtually. We may recommend an in-person follow-up visit with your provider if needed.  Visit Complete: Virtual I connected with  Carrie [name] on 01/23/24 by a audio enabled telemedicine application and verified that I am speaking with the correct person using two identifiers.  Patient Location: Home  Provider Location: Office/Clinic  I discussed the limitations of evaluation and management by telemedicine. The patient expressed understanding and agreed to proceed.  Vital Signs: Because this visit was a virtual/telehealth visit, some criteria may be missing or patient reported. Any vitals not documented were not able to be obtained and vitals that have been documented are patient reported.  VideoDeclined- This patient declined Librarian, academic. Therefore the visit was completed with audio only.  Persons Participating in Visit: Patient.  AWV Questionnaire: Yes: Patient Medicare AWV questionnaire was completed by the patient on 01/22/24; I have confirmed that all information answered by patient is correct and no changes since this date.  Cardiac Risk Factors include: advanced age (>66men, >81 women);dyslipidemia;hypertension;obesity (BMI >30kg/m2);sedentary lifestyle     Objective:    Today's Vitals   01/23/24 1300  Weight: 211 lb (95.7 kg)  Height: 5' 2.5 (1.588 m)   Body mass index is 37.98 kg/m.     01/23/2024    1:14 PM 01/02/2024   11:25 AM 12/05/2023   11:30 AM 09/24/2023   12:01 PM 12/29/2022    4:21 PM 12/29/2022    1:36 PM 03/13/2022    6:52 PM  Advanced Directives  Does Patient Have a Medical Advance Directive? No No No No No No No  Would patient like information on creating a medical advance  directive? No - Patient declined No - Patient declined No - Patient declined Yes (MAU/Ambulatory/Procedural Areas - Information given) No - Patient declined  No - Patient declined    Current Medications (verified) Outpatient Encounter Medications as of 01/23/2024  Medication Sig   acetaminophen  (TYLENOL ) 500 MG tablet Take 1,000 mg by mouth every 6 (six) hours as needed for mild pain or moderate pain.   albuterol  (VENTOLIN  HFA) 108 (90 Base) MCG/ACT inhaler Inhale 2 puffs into the lungs every 6 (six) hours as needed for wheezing or shortness of breath.   amLODipine  (NORVASC ) 10 MG tablet Take 10 mg by mouth daily.   atorvastatin  (LIPITOR) 40 MG tablet TAKE 40 MG BY MOUTH ONCE DAILY   benzonatate  (TESSALON ) 200 MG capsule Take 1 capsule (200 mg total) by mouth 3 (three) times daily as needed for cough.   hydrALAZINE  (APRESOLINE ) 25 MG tablet TAKE 1 TABLET BY MOUTH THREE TIMES A DAY   levETIRAcetam  (KEPPRA ) 500 MG tablet Take 1 tablet (500 mg total) by mouth 2 (two) times daily.   lidocaine -prilocaine  (EMLA ) cream Apply 1 application topically every Monday, Wednesday, and Friday with hemodialysis.   LORazepam  (ATIVAN ) 0.5 MG tablet TAKE 1 TABLET BY MOUTH TWICE A DAY AS NEEDED FOR ANXIETY   meclizine  (ANTIVERT ) 12.5 MG tablet TAKE 1-2 TABLETS (12.5-25 MG TOTAL) BY MOUTH 3 (THREE) TIMES DAILY AS NEEDED FOR DIZZINESS.   metoprolol  succinate (TOPROL -XL) 50 MG 24 hr tablet Take 50 mg by mouth daily.   multivitamin (RENA-VIT) TABS tablet Take 1 tablet by mouth at bedtime.   omeprazole  (PRILOSEC) 20 MG  capsule Take 1 capsule (20 mg total) by mouth in the morning and at bedtime.   ondansetron  (ZOFRAN -ODT) 4 MG disintegrating tablet Take 4 mg by mouth every 8 (eight) hours as needed for nausea or vomiting.   sevelamer  carbonate (RENVELA ) 800 MG tablet Take 800-1,600 mg by mouth See admin instructions. Take 2 tablets (1600 mg) by mouth three times daily with meals and take 1 tablet (800 mg) by mouth twice  daily with snacks   torsemide  (DEMADEX ) 20 MG tablet Take 20 mg by mouth daily. Every day (including on dialysis days)   No facility-administered encounter medications on file as of 01/23/2024.    Allergies (verified) Naproxen sodium and Sulfamethoxazole-trimethoprim   History: Past Medical History:  Diagnosis Date   Allergy    Anemia    Anxiety    Arthritis    right knee (10/26'/2017)   Chronic heart failure with preserved ejection fraction (HFpEF) (HCC)    a. 04/2021 Echo: EF >55%, GrI DD, nl RV fxn, triv MR; b. 12/2021 Echo: EF 60-65%, no rwma.   Demand ischemia (HCC)    a. 04/2021 HsTrop up to 73 in setting of volume overload; b. 10/2021 MV: EF 66% with small, mild reversible defect in the apical septal and mid anteroseptal myocardium which was felt to most likely represent artifact.   Depression    ESRD (end stage renal disease) (HCC)    a. OnHD (Dr. Woodward Brought)   GERD (gastroesophageal reflux disease)    History of blood transfusion 2008   when I had brain tumor surgery   History of MRSA infection 2008   History of stress test    a. 07/2018 MV: EF 66%, no ischemia/infarct.   Hypertension    Meningioma (HCC)    Foramen magnum   Oxygen deficiency    Pericardial effusion    a. 12/2021 Echo: EF 60-65%, no rwma, mild LVH, nl RV fxn, small pericardial effusion - small pocket of post wall, ~ 1cm.   Precordial chest pain    a. 10/2021 MV: low risk.   Type II diabetes mellitus (HCC) 2011   on metformin  until yr ago- no med now - off due to kidneys   Past Surgical History:  Procedure Laterality Date   A/V FISTULAGRAM Left 03/10/2019   Procedure: A/V FISTULAGRAM;  Surgeon: Jama Cordella MATSU, MD;  Location: [facility];  Service: Cardiovascular;  Laterality: Left;   A/V FISTULAGRAM Left 07/14/2019   Procedure: A/V FISTULAGRAM;  Surgeon: Jama Cordella MATSU, MD;  Location: [facility];  Service: Cardiovascular;  Laterality: Left;   A/V FISTULAGRAM Left  01/07/2020   Procedure: A/V FISTULAGRAM;  Surgeon: Jama Cordella MATSU, MD;  Location: [facility];  Service: Cardiovascular;  Laterality: Left;   A/V FISTULAGRAM Left 11/14/2021   Procedure: A/V Fistulagram;  Surgeon: Jama Cordella MATSU, MD;  Location: [facility];  Service: Cardiovascular;  Laterality: Left;   A/V SHUNTOGRAM Left 10/17/2023   Procedure: A/V SHUNTOGRAM;  Surgeon: Marea Selinda RAMAN, MD;  Location: [facility];  Service: Cardiovascular;  Laterality: Left;   AV FISTULA PLACEMENT Left 11/07/2018   Procedure: ARTERIOVENOUS (AV) FISTULA CREATION ( BRACHIO BASILIC ) VS BRACHIAL AXILLARY GRAFT;  Surgeon: Jama Cordella MATSU, MD;  Location: [facility];  Service: Vascular;  Laterality: Left;   BRAIN MENINGIOMA EXCISION  08/2006   Foramina magnum meningioma   CERVICAL DISCECTOMY  1996   Dr. Yves   CESAREAN SECTION  1981   COLONOSCOPY  COLONOSCOPY WITH PROPOFOL  N/A 03/07/2021   Procedure: COLONOSCOPY WITH PROPOFOL ;  Surgeon: Janalyn Keene NOVAK, MD;  Location: [facility];  Service: Endoscopy;  Laterality: N/A;   DIALYSIS/PERMA CATHETER INSERTION N/A 08/04/2018   Procedure: DIALYSIS/PERMA CATHETER INSERTION;  Surgeon: Marea Selinda RAMAN, MD;  Location: [facility];  Service: Cardiovascular;  Laterality: N/A;   DIALYSIS/PERMA CATHETER REMOVAL N/A 12/25/2018   Procedure: DIALYSIS/PERMA CATHETER REMOVAL;  Surgeon: Marea Selinda RAMAN, MD;  Location: [facility];  Service: Cardiovascular;  Laterality: N/A;   DILATION AND CURETTAGE OF UTERUS     JOINT REPLACEMENT     right knee left hip   MULTIPLE TOOTH EXTRACTIONS  01/30/2023   Right wrist x-ray  2007   Deg. at 1st MCP   TOTAL HIP ARTHROPLASTY Left 03/27/2016   Procedure: LEFT TOTAL HIP ARTHROPLASTY ANTERIOR APPROACH;  Surgeon: Lonni CINDERELLA Poli, MD;  Location: [facility] OR;  Service: Orthopedics;  Laterality: Left;   TOTAL KNEE ARTHROPLASTY Right 04/30/2017   Procedure: RIGHT TOTAL KNEE ARTHROPLASTY;   Surgeon: Poli Lonni CINDERELLA, MD;  Location: [facility] OR;  Service: Orthopedics;  Laterality: Right;   TOTAL KNEE ARTHROPLASTY Left 03/13/2022   Procedure: LEFT TOTAL KNEE ARTHROPLASTY;  Surgeon: Poli Lonni CINDERELLA, MD;  Location: [facility] OR;  Service: Orthopedics;  Laterality: Left;   TUBAL LIGATION  1981   Family History  Problem Relation Age of Onset   Hypertension Other        Strong family history    Breast cancer Mother 38   Heart failure Sister    Heart disease Sister    Social History   Socioeconomic History   Marital status: Widowed    Spouse name: Not on file   Number of children: 1   Years of education: Not on file   Highest education level: 12th grade  Occupational History   Occupation: Formerly Chief Operating Officer, 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      No living will   Requests niece, June Sorrel, as health care POA   Would accept resuscitation attempts   No tube feeds if cognitively aware   Social Drivers of Health   Financial Resource Strain: Low Risk  (01/02/2024)   Overall Financial Resource Strain (CARDIA)    Difficulty of Paying Living Expenses: Not hard at all  Recent Concern: Financial Resource Strain - High Risk (12/02/2023)   Overall Financial Resource Strain (CARDIA)    Difficulty of Paying Living Expenses: Very hard  Food Insecurity: No Food Insecurity (01/02/2024)   Hunger Vital Sign    Worried About Running Out of Food in the Last Year: Never true    Ran Out of Food in the Last Year: Never true  Recent Concern: Food Insecurity - Food Insecurity  Present (12/02/2023)   Hunger Vital Sign    Worried About Running Out of Food in the Last Year: Sometimes true    Ran Out of Food in the Last Year: Often true  Transportation Needs: No Transportation Needs (01/02/2024)   PRAPARE -  Administrator, Civil Service (Medical): No    Lack of Transportation (Non-Medical): No  Physical Activity: Insufficiently Active (01/02/2024)   Exercise Vital Sign    Days of Exercise per Week: 1 day    Minutes of Exercise per Session: 10 min  Stress: No Stress Concern Present (01/02/2024)   [employer] of Occupational Health - Occupational Stress Questionnaire    Feeling of Stress: Not at all  Social Connections: Moderately Isolated (12/02/2023)   Social Connection and Isolation Panel    Frequency of Communication with Friends and Family: Once a week    Frequency of Social Gatherings with Friends and Family: More than three times a week    Attends Religious Services: More than 4 times per year    Active Member of [employer] or Organizations: No    Attends Banker Meetings: Not on file    Marital Status: Widowed    Tobacco Counseling Counseling given: Not Answered    Clinical Intake:  Pre-visit preparation completed: Yes  Pain : No/denies pain     BMI - recorded: 37.98 Nutritional Status: BMI > 30  Obese Nutritional Risks: None Diabetes: No  Lab Results  Component Value Date   HGBA1C 5.4 03/01/2022   HGBA1C 5.4% 01/20/2021   HGBA1C 5.7% 01/29/2020     How often do you need to have someone help you when you read instructions, pamphlets, or other written materials from your doctor or pharmacy?: 1 - Never  Interpreter Needed?: No  Information entered by :: B.Maurya Nethery,LPN   Activities of Daily Living     01/22/2024    4:20 PM 10/17/2023   12:50 PM  In your present state of health, do you have any difficulty performing the following activities:  Hearing? 0 0  Vision? 0 0  Difficulty concentrating or making  decisions? 0 0  Walking or climbing stairs? 0   Dressing or bathing? 0   Doing errands, shopping? 0   Preparing Food and eating ? N   Using the Toilet? N   In the past six months, have you accidently leaked urine? N   Do you have problems with loss of bowel control? N   Managing your Medications? N   Managing your Finances? N   Housekeeping or managing your Housekeeping? N     Patient Care Team: Jimmy Charlie FERNS, MD as PCP - General Gollan, Timothy J, MD as PCP - Cardiology (Cardiology) Devra Lands, RN as [facility], Feliciano Hugger, MD as Consulting Physician (Ophthalmology)  I have updated your Care Teams any recent Medical Services you may have received from other providers in the past year.     Assessment:   This is a routine wellness examination for Carrie [name].  Hearing/Vision screen Hearing Screening - Comments:: Pt says her hearing is good Vision Screening - Comments:: Pt says her vision is good w/glasses  Dr Feliciano Enola   Goals Addressed   None    Depression Screen     01/02/2024   11:24 AM 12/05/2023   11:29 AM 12/02/2023    2:04 PM 02/26/2023    9:32 AM 02/26/2023    8:58 AM 02/20/2022    9:36 AM 12/06/2020    9:18 AM  PHQ 2/9 Scores  PHQ - 2 Score 0 0 1 0 0 1 1    Fall Risk     01/22/2024    4:20 PM 01/02/2024   11:21 AM 12/05/2023   11:27 AM 12/02/2023  2:04 PM 11/06/2023   11:43 AM  Fall Risk   Falls in the past year? 0 0 0 0 0  Number falls in past yr:  0 0 0 0  Injury with Fall?  0 0 0 0  Risk for fall due to : No Fall Risks  -- No Fall Risks   Risk for fall due to: Comment   rare dialysis treatment side effects if too much fluid pulled    Follow up Education provided;Falls prevention discussed Falls evaluation completed;Falls prevention discussed Falls evaluation completed;Falls prevention discussed Falls evaluation completed   Comment   will get assist as needed from treatment if issues after dialysis      MEDICARE RISK AT HOME:   Medicare Risk at Home Any stairs in or around the home?: (Patient-Rptd) Yes If so, are there any without handrails?: (Patient-Rptd) Yes Home free of loose throw rugs in walkways, pet beds, electrical cords, etc?: (Patient-Rptd) Yes Adequate lighting in your home to reduce risk of falls?: (Patient-Rptd) Yes Life alert?: (Patient-Rptd) No Use of a cane, walker or w/c?: (Patient-Rptd) No Grab bars in the bathroom?: (Patient-Rptd) Yes Shower chair or bench in shower?: (Patient-Rptd) Yes Elevated toilet seat or a handicapped toilet?: (Patient-Rptd) Yes  TIMED UP AND GO:  Was the test performed?  No  Cognitive Function: 6CIT completed    04/30/2016   10:14 AM  MMSE - Mini Mental State Exam  Orientation to time 5   Orientation to Place 5   Registration 3   Attention/ Calculation 0   Recall 3   Language- name 2 objects 0   Language- repeat 1  Language- follow 3 step command 3   Language- read & follow direction 0   Write a sentence 0   Copy design 0   Total score 20      Data saved with a previous flowsheet row definition        01/23/2024    1:14 PM  6CIT Screen  What Year? 0 points  What month? 0 points  What time? 0 points  Count back from 20 0 points  Months in reverse 0 points  Repeat phrase 2 points  Total Score 2 points    Immunizations Immunization History  Administered Date(s) Administered   Fluad Quad(high Dose 65+) 02/07/2021, 02/20/2022   Fluad Trivalent(High Dose 65+) 02/26/2023   Hepatitis B, ADULT 09/30/2018, 10/29/2018, 11/28/2018, 03/04/2019   Influenza Split 03/09/2011, 03/05/2012   Influenza Whole 04/11/2007, 02/27/2008, 03/07/2009, 02/22/2010   Influenza, High Dose Seasonal PF 04/10/2017, 02/20/2019, 03/30/2020   Influenza,inj,Quad PF,6+ Mos 02/03/2014, 04/01/2015, 02/16/2016   Influenza-Unspecified 04/10/2017   PFIZER Comirnaty(Gray Top)Covid-19 Tri-Sucrose Vaccine 10/15/2019, 11/05/2019   PPD Test 08/07/2017, 08/12/2019, 08/03/2020    Pneumococcal Conjugate-13 04/01/2015   Pneumococcal Polysaccharide-23 07/08/2012, 12/20/2017   Td 03/31/1996, 03/29/2009    Screening Tests Health Maintenance  Topic Date Due   Zoster Vaccines- Shingrix (1 of 2) Never done   DTaP/Tdap/Td (3 - Tdap) 03/30/2019   FOOT EXAM  02/07/2022   COVID-19 Vaccine (3 - 2024-25 season) 02/03/2023   HEMOGLOBIN A1C  06/26/2023   INFLUENZA VACCINE  01/03/2024   Colonoscopy  03/07/2024   OPHTHALMOLOGY EXAM  11/04/2024   Medicare Annual Wellness (AWV)  01/22/2025   MAMMOGRAM  11/11/2025   Pneumococcal Vaccine: 50+ Years  Completed   DEXA SCAN  Completed   Hepatitis C Screening  Completed   HPV VACCINES  Aged Out   Meningococcal B Vaccine  Aged Out   Hepatitis B  Vaccines 19-59 Average Risk  Discontinued    Health Maintenance  Health Maintenance Due  Topic Date Due   Zoster Vaccines- Shingrix (1 of 2) Never done   DTaP/Tdap/Td (3 - Tdap) 03/30/2019   FOOT EXAM  02/07/2022   COVID-19 Vaccine (3 - 2024-25 season) 02/03/2023   HEMOGLOBIN A1C  06/26/2023   INFLUENZA VACCINE  01/03/2024   Colonoscopy  03/07/2024   Health Maintenance Items Addressed: Pt will address/do Covid and Influenza later. Shingles vaccine she will receive at her pharmacy if she desires.   Additional Screening:  Vision Screening: Recommended annual ophthalmology exams for early detection of glaucoma and other disorders of the eye. Would you like a referral to an eye doctor? No    Dental Screening: Recommended annual dental exams for proper oral hygiene  Community Resource Referral / Chronic Care Management: CRR required this visit?  No   CCM required this visit?  No   Plan:    I have personally reviewed and noted the following in the patient's chart:   Medical and social history Use of alcohol, tobacco or illicit drugs  Current medications and supplements including opioid prescriptions. Patient is not currently taking opioid prescriptions. Functional ability  and status Nutritional status Physical activity Advanced directives List of other physicians Hospitalizations, surgeries, and ER visits in previous 12 months Vitals Screenings to include cognitive, depression, and falls Referrals and appointments  In addition, I have reviewed and discussed with patient certain preventive protocols, quality metrics, and best practice recommendations. A written personalized care plan for preventive services as well as general preventive health recommendations were provided to patient.   Erminio LITTIE Saris, LPN   [id number]/[id number]   After Visit Summary: (MyChart) Due to this being a telephonic visit, the after visit summary with patients personalized plan was offered to patient via MyChart   Notes: Pt relays she is currently sick with cough and cold sx. She relays she received Z-pak and cough medication from her renal md but it is not helping much. I encouraged pt to go to urgent care as she states she hears wheezing some intermittently. She relays the sputum she is coughing up has went from white to greenish.  Pt says she is using her oxygen @2L  via [place]. Pt declines appt (as she has appt for Tuesday) She declines urgent care visit. I encouraged pt to seek medical attention for any emergent sx (fever, SOB, etc). She relays there are many family members in her home that watch out for her.

## 2024-01-23 NOTE — Patient Instructions (Signed)
 Carrie [name] , Thank you for taking time out of your busy schedule to complete your Annual Wellness Visit with me. I enjoyed our conversation and look forward to speaking with you again next year. I, as well as your care team,  appreciate your ongoing commitment to your health goals. Please review the following plan we discussed and let me know if I can assist you in the future. Your Game plan/ To Do List    Referrals: If you haven't heard from the office you've been referred to, please reach out to them at the phone provided.   Follow up Visits: We will see or speak with you next year for your Next Medicare AWV with our clinical staff-01/26/25 @1pm  televisit Have you seen your provider in the last 6 months (3 months if uncontrolled diabetes)? Yes  Clinician Recommendations:  Aim for 30 minutes of exercise or brisk walking, 6-8 glasses of water, and 5 servings of fruits and vegetables each day.       This is a list of the screenings recommended for you:  Health Maintenance  Topic Date Due   Zoster (Shingles) Vaccine (1 of 2) Never done   DTaP/Tdap/Td vaccine (3 - Tdap) 03/30/2019   Complete foot exam   02/07/2022   COVID-19 Vaccine (3 - 2024-25 season) 02/03/2023   Hemoglobin A1C  06/26/2023   Flu Shot  01/03/2024   Colon Cancer Screening  03/07/2024   Eye exam for diabetics  11/04/2024   Medicare Annual Wellness Visit  01/22/2025   Mammogram  11/11/2025   Pneumococcal Vaccine for age over 39  Completed   DEXA scan (bone density measurement)  Completed   Hepatitis C Screening  Completed   HPV Vaccine  Aged Out   Meningitis B Vaccine  Aged Out   Hepatitis B Vaccine  Discontinued    Advanced directives: (Copy Requested) Please bring a copy of your health care power of attorney and living will to the office to be added to your chart at your convenience. You can mail to [facility] [address] [place], [place] [address] or email to [email or web address] Advance  Care Planning is important because it:  [x]  Makes sure you receive the medical care that is consistent with your values, goals, and preferences  [x]  It provides guidance to your family and loved ones and reduces their decisional burden about whether or not they are making the right decisions based on your wishes.  Follow the link provided in your after visit summary or read over the paperwork we have mailed to you to help you started getting your Advance Directives in place. If you need assistance in completing these, please reach out to us  so that we can help you!

## 2024-01-24 DIAGNOSIS — Z992 Dependence on renal dialysis: Secondary | ICD-10-CM | POA: Diagnosis not present

## 2024-01-24 DIAGNOSIS — N186 End stage renal disease: Secondary | ICD-10-CM | POA: Diagnosis not present

## 2024-01-27 ENCOUNTER — Encounter: Payer: Self-pay | Admitting: Internal Medicine

## 2024-01-27 ENCOUNTER — Ambulatory Visit (INDEPENDENT_AMBULATORY_CARE_PROVIDER_SITE_OTHER): Admitting: Internal Medicine

## 2024-01-27 ENCOUNTER — Ambulatory Visit: Payer: Self-pay | Admitting: *Deleted

## 2024-01-27 ENCOUNTER — Ambulatory Visit
Admission: RE | Admit: 2024-01-27 | Discharge: 2024-01-27 | Disposition: A | Discharge status: Home / Self Care | Source: Ambulatory Visit | Attending: Internal Medicine | Admitting: Internal Medicine

## 2024-01-27 VITALS — BP 138/88 | HR 66 | Temp 98.3°F | Ht 62.5 in | Wt 216.0 lb

## 2024-01-27 DIAGNOSIS — R059 Cough, unspecified: Secondary | ICD-10-CM | POA: Diagnosis not present

## 2024-01-27 DIAGNOSIS — N186 End stage renal disease: Secondary | ICD-10-CM | POA: Diagnosis not present

## 2024-01-27 DIAGNOSIS — I700 Atherosclerosis of aorta: Secondary | ICD-10-CM | POA: Diagnosis not present

## 2024-01-27 DIAGNOSIS — I517 Cardiomegaly: Secondary | ICD-10-CM | POA: Diagnosis not present

## 2024-01-27 DIAGNOSIS — Z992 Dependence on renal dialysis: Secondary | ICD-10-CM | POA: Diagnosis not present

## 2024-01-27 DIAGNOSIS — R918 Other nonspecific abnormal finding of lung field: Secondary | ICD-10-CM | POA: Diagnosis not present

## 2024-01-27 DIAGNOSIS — R058 Other specified cough: Secondary | ICD-10-CM

## 2024-01-27 MED ORDER — DOXYCYCLINE HYCLATE 100 MG PO TABS: 100.0000 mg | ORAL_TABLET | Freq: Two times a day (BID) | ORAL | 0 refills | Status: DC

## 2024-01-27 MED ORDER — HYDROCODONE BIT-HOMATROP MBR 5-1.5 MG/5ML PO SOLN: 5.0000 mL | Freq: Every evening | ORAL | 0 refills | Status: DC | PRN

## 2024-01-27 NOTE — Telephone Encounter (Signed)
Will assess at today's visit 

## 2024-01-27 NOTE — Assessment & Plan Note (Addendum)
 Sounds like she may have mostly sinus drainage--but will check CXR  CXR shows enlarged heart but no pneumonia Will change to doxycycline  100 bid x 7 days Hycodan for cough

## 2024-01-27 NOTE — Progress Notes (Signed)
 Subjective:    Patient ID: Carrie [name], female    DOB: 10/08/1950, 73 y.o.   MRN: [id number]  HPI Here due to cough With sisters--they are also sick  Sick for a week Dr Douglas gave her z-pak and tessalon  Took prednisone  also--no help (just made her eat a lot) No fever Rough cough---now with green sputum Not sleeping due to cough Has been using oxygen at night--due to SOB with cough Not sure if post nasal drip---but it makes her choke. No ear pain or sore throat  Didn't test for COVID  Current Outpatient Medications on File Prior to Visit  Medication Sig Dispense Refill   acetaminophen  (TYLENOL ) 500 MG tablet Take 1,000 mg by mouth every 6 (six) hours as needed for mild pain or moderate pain.     amLODipine  (NORVASC ) 10 MG tablet Take 10 mg by mouth daily.     atorvastatin  (LIPITOR) 40 MG tablet TAKE 40 MG BY MOUTH ONCE DAILY 90 tablet 3   hydrALAZINE  (APRESOLINE ) 25 MG tablet TAKE 1 TABLET BY MOUTH THREE TIMES A DAY 270 tablet 1   levETIRAcetam  (KEPPRA ) 500 MG tablet Take 1 tablet (500 mg total) by mouth 2 (two) times daily. 180 tablet 3   lidocaine -prilocaine  (EMLA ) cream Apply 1 application topically every Monday, Wednesday, and Friday with hemodialysis.     LORazepam  (ATIVAN ) 0.5 MG tablet TAKE 1 TABLET BY MOUTH TWICE A DAY AS NEEDED FOR ANXIETY 60 tablet 0   meclizine  (ANTIVERT ) 12.5 MG tablet TAKE 1-2 TABLETS (12.5-25 MG TOTAL) BY MOUTH 3 (THREE) TIMES DAILY AS NEEDED FOR DIZZINESS. 60 tablet 1   metoprolol  succinate (TOPROL -XL) 50 MG 24 hr tablet Take 25 mg by mouth daily.     multivitamin (RENA-VIT) TABS tablet Take 1 tablet by mouth at bedtime. 30 tablet 0   omeprazole  (PRILOSEC) 20 MG capsule Take 1 capsule (20 mg total) by mouth in the morning and at bedtime. 180 capsule 3   ondansetron  (ZOFRAN -ODT) 4 MG disintegrating tablet Take 4 mg by mouth every 8 (eight) hours as needed for nausea or vomiting.     sevelamer  carbonate (RENVELA ) 800 MG tablet Take 800-1,600 mg  by mouth See admin instructions. Take 2 tablets (1600 mg) by mouth three times daily with meals and take 1 tablet (800 mg) by mouth twice daily with snacks     torsemide  (DEMADEX ) 20 MG tablet Take 20 mg by mouth daily. Every day (including on dialysis days)     No current facility-administered medications on file prior to visit.    Allergies  Allergen Reactions   Naproxen Sodium Anaphylaxis and Swelling   Sulfamethoxazole-Trimethoprim Anaphylaxis and Swelling    Past Medical History:  Diagnosis Date   Allergy    Anemia    Anxiety    Arthritis    right knee (10/26'/2017)   Chronic heart failure with preserved ejection fraction (HFpEF) (HCC)    a. 04/2021 Echo: EF >55%, GrI DD, nl RV fxn, triv MR; b. 12/2021 Echo: EF 60-65%, no rwma.   Demand ischemia (HCC)    a. 04/2021 HsTrop up to 73 in setting of volume overload; b. 10/2021 MV: EF 66% with small, mild reversible defect in the apical septal and mid anteroseptal myocardium which was felt to most likely represent artifact.   Depression    ESRD (end stage renal disease) (HCC)    a. OnHD (Dr. Woodward Douglas)   GERD (gastroesophageal reflux disease)    History of blood transfusion 2008   when  I had brain tumor surgery   History of MRSA infection 2008   History of stress test    a. 07/2018 MV: EF 66%, no ischemia/infarct.   Hypertension    Meningioma (HCC)    Foramen magnum   Oxygen deficiency    Pericardial effusion    a. 12/2021 Echo: EF 60-65%, no rwma, mild LVH, nl RV fxn, small pericardial effusion - small pocket of post wall, ~ 1cm.   Precordial chest pain    a. 10/2021 MV: low risk.   Type II diabetes mellitus (HCC) 2011   on metformin  until yr ago- no med now - off due to kidneys    Past Surgical History:  Procedure Laterality Date   A/V FISTULAGRAM Left 03/10/2019   Procedure: A/V FISTULAGRAM;  Surgeon: Jama Cordella MATSU, MD;  Location: [facility];  Service: Cardiovascular;  Laterality: Left;   A/V  FISTULAGRAM Left 07/14/2019   Procedure: A/V FISTULAGRAM;  Surgeon: Jama Cordella MATSU, MD;  Location: [facility];  Service: Cardiovascular;  Laterality: Left;   A/V FISTULAGRAM Left 01/07/2020   Procedure: A/V FISTULAGRAM;  Surgeon: Jama Cordella MATSU, MD;  Location: [facility];  Service: Cardiovascular;  Laterality: Left;   A/V FISTULAGRAM Left 11/14/2021   Procedure: A/V Fistulagram;  Surgeon: Jama Cordella MATSU, MD;  Location: [facility];  Service: Cardiovascular;  Laterality: Left;   A/V SHUNTOGRAM Left 10/17/2023   Procedure: A/V SHUNTOGRAM;  Surgeon: Marea Selinda RAMAN, MD;  Location: [facility];  Service: Cardiovascular;  Laterality: Left;   AV FISTULA PLACEMENT Left 11/07/2018   Procedure: ARTERIOVENOUS (AV) FISTULA CREATION ( BRACHIO BASILIC ) VS BRACHIAL AXILLARY GRAFT;  Surgeon: Jama Cordella MATSU, MD;  Location: [facility];  Service: Vascular;  Laterality: Left;   BRAIN MENINGIOMA EXCISION  08/2006   Foramina magnum meningioma   CERVICAL DISCECTOMY  1996   Dr. Yves   CESAREAN SECTION  1981   COLONOSCOPY     COLONOSCOPY WITH PROPOFOL  N/A 03/07/2021   Procedure: COLONOSCOPY WITH PROPOFOL ;  Surgeon: Janalyn Keene NOVAK, MD;  Location: [facility];  Service: Endoscopy;  Laterality: N/A;   DIALYSIS/PERMA CATHETER INSERTION N/A 08/04/2018   Procedure: DIALYSIS/PERMA CATHETER INSERTION;  Surgeon: Marea Selinda RAMAN, MD;  Location: [facility];  Service: Cardiovascular;  Laterality: N/A;   DIALYSIS/PERMA CATHETER REMOVAL N/A 12/25/2018   Procedure: DIALYSIS/PERMA CATHETER REMOVAL;  Surgeon: Marea Selinda RAMAN, MD;  Location: [facility];  Service: Cardiovascular;  Laterality: N/A;   DILATION AND CURETTAGE OF UTERUS     JOINT REPLACEMENT     right knee left hip   MULTIPLE TOOTH EXTRACTIONS  01/30/2023   Right wrist x-ray  2007   Deg. at 1st MCP   TOTAL HIP ARTHROPLASTY Left 03/27/2016   Procedure: LEFT TOTAL HIP ARTHROPLASTY ANTERIOR  APPROACH;  Surgeon: Lonni CINDERELLA Poli, MD;  Location: [facility];  Service: Orthopedics;  Laterality: Left;   TOTAL KNEE ARTHROPLASTY Right 04/30/2017   Procedure: RIGHT TOTAL KNEE ARTHROPLASTY;  Surgeon: Poli Lonni CINDERELLA, MD;  Location: [facility];  Service: Orthopedics;  Laterality: Right;   TOTAL KNEE ARTHROPLASTY Left 03/13/2022   Procedure: LEFT TOTAL KNEE ARTHROPLASTY;  Surgeon: Poli Lonni CINDERELLA, MD;  Location: [facility];  Service: Orthopedics;  Laterality: Left;   TUBAL LIGATION  1981    Family History  Problem Relation Age of Onset   Hypertension Other        Strong family history    Breast cancer  Mother 35   Heart failure Sister    Heart disease Sister     Social History   Socioeconomic History   Marital status: Widowed    Spouse name: Not on file   Number of children: 1   Years of education: Not on file   Highest education level: 12th grade  Occupational History   Occupation: Formerly Chief Operating Officer, 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      No living will   Requests niece, June Sorrel, as health care POA   Would accept resuscitation attempts   No tube feeds if cognitively aware   Social Drivers of Health   Financial Resource Strain: Low Risk  (01/02/2024)   Overall Financial Resource Strain (CARDIA)    Difficulty of Paying Living Expenses: Not hard at all  Recent Concern: Financial Resource Strain - High Risk (12/02/2023)   Overall Financial Resource Strain (CARDIA)    Difficulty of Paying Living Expenses: Very hard  Food Insecurity: No Food Insecurity  (01/02/2024)   Hunger Vital Sign    Worried About Running Out of Food in the Last Year: Never true    Ran Out of Food in the Last Year: Never true  Recent Concern: Food Insecurity - Food Insecurity Present (12/02/2023)   Hunger Vital Sign    Worried About Running Out of Food in the Last Year: Sometimes true    Ran Out of Food in the Last Year: Often true  Transportation Needs: No Transportation Needs (01/02/2024)   PRAPARE - Administrator, Civil Service (Medical): No    Lack of Transportation (Non-Medical): No  Physical Activity: Insufficiently Active (01/02/2024)   Exercise Vital Sign    Days of Exercise per Week: 1 day    Minutes of Exercise per Session: 10 min  Stress: No Stress Concern Present (01/02/2024)   [employer] of Occupational Health - Occupational Stress Questionnaire    Feeling of Stress: Not at all  Social Connections: Moderately Isolated (12/02/2023)   Social Connection and Isolation Panel    Frequency of Communication with Friends and Family: Once a week    Frequency of Social Gatherings with Friends and Family: More than three times a week    Attends Religious Services: More than 4 times per year    Active Member of [employer] or Organizations: No    Attends Banker Meetings: Not on file    Marital Status: Widowed  Intimate Partner Violence: Not At Risk (01/02/2024)   Humiliation, Afraid, Rape, and Kick questionnaire    Fear of Current or Ex-Partner: No    Emotionally Abused: No    Physically Abused: No    Sexually Abused: No   Review of Systems No N/V Eating is off--but able to  No loss of smell or taste    Objective:   Physical Exam Constitutional:      General: She is not in acute distress.    Comments: Looks mildly ill  HENT:     Ears:  Comments: Cerumen bilaterally    Mouth/Throat:     Pharynx: No oropharyngeal exudate or posterior oropharyngeal erythema.  Pulmonary:     Effort: Pulmonary effort is normal.     Breath  sounds: Normal breath sounds. No wheezing or rales.  Musculoskeletal:     Cervical back: Neck supple.  Lymphadenopathy:     Cervical: No cervical adenopathy.  Neurological:     Mental Status: She is alert.            Assessment & Plan:

## 2024-01-27 NOTE — Telephone Encounter (Signed)
 FYI Only or Action Required?: FYI only for provider.  Patient was last seen in primary care on 12/02/2023 by Jimmy Charlie FERNS, MD.  Called Nurse Triage reporting Cough.  Symptoms began a week ago.  Interventions attempted: Prescription medications: antibiotic and cough medication .  Symptoms are: unchanged.  Triage Disposition: See HCP Within 4 Hours (Or PCP Triage)  Patient/caregiver understands and will follow disposition?: Yes    Reason for Disposition  [1] MILD difficulty breathing (e.g., minimal/no SOB at rest, SOB with walking, pulse < 100) AND [2] still present when not coughing  Answer Assessment - Initial Assessment Questions 1. ONSET: When did the cough begin?      1 week- patient states her provider sent antibiotic /cough syrup- did not help 2. SEVERITY: How bad is the cough today?      Patient is coughing at night- feels exhausted 3. SPUTUM: Describe the color of your sputum (e.g., none, dry cough; clear, white, yellow, green)     green 4. HEMOPTYSIS: Are you coughing up any blood? If Yes, ask: How much? (e.g., flecks, streaks, tablespoons, etc.)     no 5. DIFFICULTY BREATHING: Are you having difficulty breathing? If Yes, ask: How bad is it? (e.g., mild, moderate, severe)      Ye- patient is on O2 at dialysis now 6. FEVER: Do you have a fever? If Yes, ask: What is your temperature, how was it measured, and when did it start?     no 7. CARDIAC HISTORY: Do you have any history of heart disease? (e.g., heart attack, congestive heart failure)      no 8. LUNG HISTORY: Do you have any history of lung disease?  (e.g., pulmonary embolus, asthma, emphysema)     no  10. OTHER SYMPTOMS: Do you have any other symptoms? (e.g., runny nose, wheezing, chest pain)       Cough, congestion, chest pain  Protocols used: Cough - Acute Productive-A-AH   Copied from CRM #[id number]. Topic: Clinical - Red Word Triage >> Jan 27, 2024  8:12 AM Mesmerise C  wrote: [facility] that prompted transfer to Nurse Triage: Patient stated she's sick, been coughing and her chest hurts when she does, been ongoing for a week hasn't been able to sleep, states she she coughs it's a green mucus, been taking medications but no improvement

## 2024-01-28 ENCOUNTER — Encounter: Payer: Medicare HMO | Admitting: Internal Medicine

## 2024-01-29 DIAGNOSIS — Z992 Dependence on renal dialysis: Secondary | ICD-10-CM | POA: Diagnosis not present

## 2024-01-29 DIAGNOSIS — N186 End stage renal disease: Secondary | ICD-10-CM | POA: Diagnosis not present

## 2024-01-30 ENCOUNTER — Other Ambulatory Visit: Payer: Self-pay

## 2024-01-30 DIAGNOSIS — Z992 Dependence on renal dialysis: Secondary | ICD-10-CM | POA: Diagnosis not present

## 2024-01-30 DIAGNOSIS — N186 End stage renal disease: Secondary | ICD-10-CM | POA: Diagnosis not present

## 2024-01-30 NOTE — Patient Instructions (Signed)
 Visit Information  Thank you for taking time to visit with me today. Please don't hesitate to contact me if I can be of assistance to you before our next scheduled appointment.  Your next care management appointment is by telephone on 02/27/2024 at 11:00 am  Telephone follow-up in 1 month  Please call the care guide team at [phone] if you need to cancel, schedule, or reschedule an appointment.   Please call the Suicide and Crisis Lifeline: 988 call the [place]  National Suicide Prevention Lifeline: 289-846-3308 or TTY: 819-740-6679 TTY 743-296-1012) to talk to a trained counselor call 1-800-273-TALK (toll free, 24 hour hotline) go to [facility] [address], [place] [phone]) call 911 if you are experiencing a Mental Health or Behavioral Health Crisis or need someone to talk to.  Nestora Duos, MSN, RN [facility], [facility] RN Care Manager Direct Dial : [phone] Fax: [phone]

## 2024-01-30 NOTE — Patient Outreach (Signed)
 Complex Care Management   Visit Note  01/30/2024  Name:  Carrie [name] MRN: [id number] DOB: 1951/05/17  Situation: Referral received for Complex Care Management related to Heart Failure and HTN I obtained verbal consent from Patient.  Visit completed with Patient  on the phone  Background:   Past Medical History:  Diagnosis Date   Allergy    Anemia    Anxiety    Arthritis    right knee (10/26'/2017)   Chronic heart failure with preserved ejection fraction (HFpEF) (HCC)    a. 04/2021 Echo: EF >55%, GrI DD, nl RV fxn, triv MR; b. 12/2021 Echo: EF 60-65%, no rwma.   Demand ischemia (HCC)    a. 04/2021 HsTrop up to 73 in setting of volume overload; b. 10/2021 MV: EF 66% with small, mild reversible defect in the apical septal and mid anteroseptal myocardium which was felt to most likely represent artifact.   Depression    ESRD (end stage renal disease) (HCC)    a. OnHD (Dr. Woodward Brought)   GERD (gastroesophageal reflux disease)    History of blood transfusion 2008   when I had brain tumor surgery   History of MRSA infection 2008   History of stress test    a. 07/2018 MV: EF 66%, no ischemia/infarct.   Hypertension    Meningioma (HCC)    Foramen magnum   Oxygen deficiency    Pericardial effusion    a. 12/2021 Echo: EF 60-65%, no rwma, mild LVH, nl RV fxn, small pericardial effusion - small pocket of post wall, ~ 1cm.   Precordial chest pain    a. 10/2021 MV: low risk.   Type II diabetes mellitus (HCC) 2011   on metformin  until yr ago- no med now - off due to kidneys    Assessment: Patient Reported Symptoms:  Cognitive Cognitive Status: Alert and oriented to person, place, and time, Insightful and able to interpret abstract concepts, Normal speech and language skills Cognitive/Intellectual Conditions Management [RPT]: None reported or documented in medical history or problem list   Health Maintenance Behaviors: Annual physical exam, Healthy diet, Social activities,  Spiritual practice(s) Healing Pattern: Average Health Facilitated by: Prayer/meditation, Healthy diet  Neurological Neurological Review of Symptoms: No symptoms reported Neurological Management Strategies: Medication therapy, Routine screening Neurological Comment: no seizure activity  HEENT HEENT Symptoms Reported: Not assessed HEENT Management Strategies: Medication therapy, Routine screening    Cardiovascular Cardiovascular Symptoms Reported: No symptoms reported Does patient have uncontrolled Hypertension?: No Cardiovascular Management Strategies: Routine screening, Medication therapy, Weight management, Diet modification, Fluid modification Do You Have a Working Readable Scale?: Yes  Respiratory Respiratory Symptoms Reported: Productive cough Other Respiratory Symptoms: Acute appointment 01/27/24 currently on doxycycline  100 mg BID for 7 days for respiratory infection, hycodan prn at night, no longer green, clear mucous, using O2 at night/sleep/naps, no SOB, aware to call PCP if sx worsen after abx completed - sx discussed Respiratory Management Strategies: Routine screening, Oxygen therapy, Medication therapy, Weight management, Fluid modification  Endocrine Endocrine Symptoms Reported: No symptoms reported Is patient diabetic?: No    Gastrointestinal Gastrointestinal Symptoms Reported: No symptoms reported Additional Gastrointestinal Details: no issues with doxycycline       Genitourinary   Genitourinary Management Strategies: Hemodialysis Hemodialysis Schedule: MWF [facility] 01/29/24 Hemodialysis Last Treatment: 01/29/24 Genitourinary Self-Management Outcome: 4 (good) Genitourinary Comment: no graft issues  Integumentary Integumentary Symptoms Reported: No symptoms reported Skin Comment: LUE graft for dialysis  Musculoskeletal Musculoskelatal Symptoms Reviewed: Not assessed Musculoskeletal Management Strategies: Routine  screening, Medication therapy, Diet modification,  Fluid modification, Weight management Falls in the past year?: No Number of falls in past year: 1 or less Was there an injury with Fall?: No Fall Risk Category Calculator: 0 Patient Fall Risk Level: Low Fall Risk Fall risk Follow up: Falls evaluation completed, Falls prevention discussed  Psychosocial Psychosocial Symptoms Reported: No symptoms reported Behavioral Management Strategies: Medication therapy, Support system, Adequate rest Major Change/Loss/Stressor/Fears (CP): Medical condition, self, Medical condition, family Techniques to [place] with Loss/Stress/Change: Diversional activities, Medication, Spiritual practice(s) Quality of Family Relationships: helpful, involved, supportive Do you feel physically threatened by others?: No    01/30/2024    PHQ2-9 Depression Screening   Little interest or pleasure in doing things Not at all  Feeling down, depressed, or hopeless Not at all  PHQ-2 - Total Score 0  Trouble falling or staying asleep, or sleeping too much    Feeling tired or having little energy    Poor appetite or overeating     Feeling bad about yourself - or that you are a failure or have let yourself or your family down    Trouble concentrating on things, such as reading the newspaper or watching television    Moving or speaking so slowly that other people could have noticed.  Or the opposite - being so fidgety or restless that you have been moving around a lot more than usual    Thoughts that you would be better off dead, or hurting yourself in some way    PHQ2-9 Total Score    If you checked off any problems, how difficult have these problems made it for you to do your work, take care of things at home, or get along with other people    Depression Interventions/Treatment      Vitals:   01/30/24 1116  BP: 135/80    Medications Reviewed Today     Reviewed by Devra Lands, RN (Registered Nurse) on 01/30/24 at 1108  Med List Status: <None>   Medication Order Taking?  Sig Documenting Provider Last Dose Status Informant  acetaminophen  (TYLENOL ) 500 MG tablet [id number] Yes Take 1,000 mg by mouth every 6 (six) hours as needed for mild pain or moderate pain. [provider]  Active Self  amLODipine  (NORVASC ) 10 MG tablet [id number] Yes Take 10 mg by mouth daily. [provider]  Active Self  atorvastatin  (LIPITOR) 40 MG tablet [id number] Yes TAKE 40 MG BY MOUTH ONCE DAILY Jimmy Charlie FERNS, MD  Active Self  doxycycline  (VIBRA -TABS) 100 MG tablet [id number] Yes Take 1 tablet (100 mg total) by mouth 2 (two) times daily. Letvak, Richard I, MD  Active   hydrALAZINE  (APRESOLINE ) 25 MG tablet [id number] Yes TAKE 1 TABLET BY MOUTH THREE TIMES A DAY  Patient taking differently: Take 25 mg by mouth 3 (three) times daily. Patient reports reduced to BID by nephrologist   Jimmy Charlie FERNS, MD  Active   HYDROcodone  bit-homatropine [facility]) 5-1.5 MG/5ML syrup [id number] Yes Take 5 mLs by mouth at bedtime as needed for cough. Letvak, Richard I, MD  Active   levETIRAcetam  (KEPPRA ) 500 MG tablet [id number] Yes Take 1 tablet (500 mg total) by mouth 2 (two) times daily. Letvak, Richard I, MD  Active   lidocaine -prilocaine  (EMLA ) cream [id number] Yes Apply 1 application topically every Monday, Wednesday, and Friday with hemodialysis. [provider]  Active Self  LORazepam  (ATIVAN ) 0.5 MG tablet [id number] Yes TAKE 1 TABLET BY MOUTH TWICE A DAY AS NEEDED FOR ANXIETY Letvak, Richard  I, MD  Active   meclizine  (ANTIVERT ) 12.5 MG tablet [id number] Yes TAKE 1-2 TABLETS (12.5-25 MG TOTAL) BY MOUTH 3 (THREE) TIMES DAILY AS NEEDED FOR DIZZINESS. Letvak, Richard I, MD  Active   metoprolol  succinate (TOPROL -XL) 50 MG 24 hr tablet [id number] Yes Take 25 mg by mouth daily. [provider]  Active Self  multivitamin (RENA-VIT) TABS tablet [id number] Yes Take 1 tablet by mouth at bedtime. Josette Ade, MD  Active Self  omeprazole  (PRILOSEC) 20 MG capsule [id number] Yes  Take 1 capsule (20 mg total) by mouth in the morning and at bedtime. Letvak, Richard I, MD  Active   ondansetron  (ZOFRAN -ODT) 4 MG disintegrating tablet [id number] Yes Take 4 mg by mouth every 8 (eight) hours as needed for nausea or vomiting. [provider]  Active Self  sevelamer  carbonate (RENVELA ) 800 MG tablet [id number] Yes Take 800-1,600 mg by mouth See admin instructions. Take 2 tablets (1600 mg) by mouth three times daily with meals and take 1 tablet (800 mg) by mouth twice daily with snacks [provider]  Active Self  torsemide  (DEMADEX ) 20 MG tablet [id number] Yes Take 20 mg by mouth daily. Every day (including on dialysis days) [provider]  Active Self            Recommendation:   PCP Follow-up Continue Current Plan of Care  Follow Up Plan:   Telephone follow-up in 1 month  Nestora Duos, MSN, RN [facility]  [facility], [facility] RN Care Manager Direct Dial : [phone] Fax: [phone]

## 2024-01-31 DIAGNOSIS — Z992 Dependence on renal dialysis: Secondary | ICD-10-CM | POA: Diagnosis not present

## 2024-01-31 DIAGNOSIS — N186 End stage renal disease: Secondary | ICD-10-CM | POA: Diagnosis not present

## 2024-02-01 ENCOUNTER — Ambulatory Visit: Payer: Self-pay | Admitting: Family

## 2024-02-02 DIAGNOSIS — Z992 Dependence on renal dialysis: Secondary | ICD-10-CM | POA: Diagnosis not present

## 2024-02-02 DIAGNOSIS — N186 End stage renal disease: Secondary | ICD-10-CM | POA: Diagnosis not present

## 2024-02-10 ENCOUNTER — Ambulatory Visit: Payer: Self-pay

## 2024-02-10 NOTE — Telephone Encounter (Signed)
 FYI Only or Action Required?: FYI only for provider.  Patient was last seen in primary care on 01/27/2024 by Jimmy Charlie FERNS, MD.  Called Nurse Triage reporting cough.  Symptoms began about a month ago.  Interventions attempted: Prescription medications: cough med and abx . Pt requesting refill of cough medicine she is out and it is keeping her from sleep and preventing her form going to HD Symptoms are: unchanged.  Triage Disposition: See physician in 24 -- please cancel appt if not needed Patient/caregiver understands and will follow disposition?: yes      Copied from CRM #[id number]. Topic: Clinical - Red Word Triage >> Feb 10, 2024  8:04 AM Emylou G wrote: [facility] that prompted transfer to Nurse Triage: persistant cough is not allowing her to sleep.SABRA antibiotics isn't working and cough syrup ( was seen 8/25 ) Reason for Disposition  [1] MILD difficulty breathing (e.g., minimal/no SOB at rest, SOB with walking, pulse < 100) AND [2] still present when not coughing  Answer Assessment - Initial Assessment Questions 1. ONSET: When did the cough begin?      3 weeks 2. SEVERITY: How bad is the cough today?      Keeping her from sleep and HD 3. SPUTUM: Describe the color of your sputum (e.g., none, dry cough; clear, white, yellow, green)     clear 4. HEMOPTYSIS: Are you coughing up any blood? If Yes, ask: How much? (e.g., flecks, streaks, tablespoons, etc.)     no 5. DIFFICULTY BREATHING: Are you having difficulty breathing? If Yes, ask: How bad is it? (e.g., mild, moderate, severe)      Wears oxygen O2 level h/I SOB ///SOB with exertion 6. FEVER: Do you have a fever? If Yes, ask: What is your temperature, how was it measured, and when did it start?     no 8. LUNG HISTORY: Do you have any history of lung disease?  (e.g., pulmonary embolus, asthma, emphysema)     no 10. OTHER SYMPTOMS: Do you have any other symptoms? (e.g., runny nose, wheezing, chest pain)        Headache, runny nose  Protocols used: Cough - Acute Productive-A-AH

## 2024-02-11 ENCOUNTER — Telehealth: Payer: Self-pay | Admitting: Family

## 2024-02-11 ENCOUNTER — Encounter: Payer: Self-pay | Admitting: Family

## 2024-02-11 ENCOUNTER — Ambulatory Visit (INDEPENDENT_AMBULATORY_CARE_PROVIDER_SITE_OTHER): Admitting: Family

## 2024-02-11 VITALS — BP 144/82 | HR 64 | Temp 98.2°F | Ht 62.5 in | Wt 220.4 lb

## 2024-02-11 DIAGNOSIS — J189 Pneumonia, unspecified organism: Secondary | ICD-10-CM

## 2024-02-11 MED ORDER — DOXYCYCLINE HYCLATE 100 MG PO TABS: 100.0000 mg | ORAL_TABLET | Freq: Two times a day (BID) | ORAL | 0 refills | Status: AC

## 2024-02-11 NOTE — Progress Notes (Signed)
 Established Patient Office Visit  Subjective:      CC:  Chief Complaint  Patient presents with   Acute Visit    Cough    HPI: Carrie [name] is a 73 y.o. female presenting on 02/11/2024 for Acute Visit (Cough) .  Discussed the use of AI scribe software for clinical note transcription with the patient, who gave verbal consent to proceed.  History of Present Illness Carrie [name] is a 73 year old female with congestive heart failure and renal impairment who presents with c/o cough that is lingering. She was seen previously with her PCP Dr. Jimmy, of whom has recently retired, and given doxycycline  100 mg po bid x 10 days. She did have some moderate improvement but is still stuck with a cough that keeps her up at night. She did take hycodan prn which helped slightly as well. She does still report sob and left lower back pain especially with coughing. She does not report a fever, no sore throat or ear pain.   She has a history of congestive heart failure and uses supplemental oxygen as needed for dyspnea.  She is undergoing dialysis, which influences her medication options.   She maintains adequate hydration, aiming for 32 ounces of water daily.  No issues with appetite and she is eating well. She experiences lethargy, tiredness, and weakness.         Social history:  Relevant past medical, surgical, family and social history reviewed and updated as indicated. Interim medical history since our last visit reviewed.  Allergies and medications reviewed and updated.  DATA REVIEWED: CHART IN EPIC     ROS: Negative unless specifically indicated above in HPI.    Current Outpatient Medications:    acetaminophen  (TYLENOL ) 500 MG tablet, Take 1,000 mg by mouth every 6 (six) hours as needed for mild pain or moderate pain., Disp: , Rfl:    amLODipine  (NORVASC ) 10 MG tablet, Take 10 mg by mouth daily., Disp: , Rfl:    atorvastatin  (LIPITOR) 40 MG tablet, TAKE 40 MG BY  MOUTH ONCE DAILY, Disp: 90 tablet, Rfl: 3   doxycycline  (VIBRA -TABS) 100 MG tablet, Take 1 tablet (100 mg total) by mouth 2 (two) times daily for 10 days., Disp: 20 tablet, Rfl: 0   hydrALAZINE  (APRESOLINE ) 25 MG tablet, TAKE 1 TABLET BY MOUTH THREE TIMES A DAY (Patient taking differently: Take 25 mg by mouth 3 (three) times daily. Patient reports reduced to BID by nephrologist), Disp: 270 tablet, Rfl: 1   levETIRAcetam  (KEPPRA ) 500 MG tablet, Take 1 tablet (500 mg total) by mouth 2 (two) times daily., Disp: 180 tablet, Rfl: 3   lidocaine -prilocaine  (EMLA ) cream, Apply 1 application topically every Monday, Wednesday, and Friday with hemodialysis., Disp: , Rfl:    LORazepam  (ATIVAN ) 0.5 MG tablet, TAKE 1 TABLET BY MOUTH TWICE A DAY AS NEEDED FOR ANXIETY, Disp: 60 tablet, Rfl: 0   meclizine  (ANTIVERT ) 12.5 MG tablet, TAKE 1-2 TABLETS (12.5-25 MG TOTAL) BY MOUTH 3 (THREE) TIMES DAILY AS NEEDED FOR DIZZINESS., Disp: 60 tablet, Rfl: 1   metoprolol  succinate (TOPROL -XL) 50 MG 24 hr tablet, Take 25 mg by mouth daily., Disp: , Rfl:    multivitamin (RENA-VIT) TABS tablet, Take 1 tablet by mouth at bedtime., Disp: 30 tablet, Rfl: 0   omeprazole  (PRILOSEC) 20 MG capsule, Take 1 capsule (20 mg total) by mouth in the morning and at bedtime., Disp: 180 capsule, Rfl: 3   ondansetron  (ZOFRAN -ODT) 4 MG disintegrating tablet, Take 4 mg  by mouth every 8 (eight) hours as needed for nausea or vomiting., Disp: , Rfl:    sevelamer  carbonate (RENVELA ) 800 MG tablet, Take 800-1,600 mg by mouth See admin instructions. Take 2 tablets (1600 mg) by mouth three times daily with meals and take 1 tablet (800 mg) by mouth twice daily with snacks, Disp: , Rfl:    torsemide  (DEMADEX ) 20 MG tablet, Take 20 mg by mouth daily. Every day (including on dialysis days), Disp: , Rfl:         Objective:        BP (!) 144/82 (BP Location: Right Arm, Patient Position: Sitting, Cuff Size: Large)   Pulse 64   Temp 98.2 F (36.8 C)  (Temporal)   Ht 5' 2.5 (1.588 m)   Wt 220 lb 6.4 oz (100 kg)   SpO2 92%   BMI 39.67 kg/m   Physical Exam   Wt Readings from Last 3 Encounters:  02/11/24 220 lb 6.4 oz (100 kg)  01/27/24 216 lb (98 kg)  01/23/24 211 lb (95.7 kg)    Physical Exam Constitutional:      General: She is not in acute distress.    Appearance: Normal appearance. She is normal weight. She is not ill-appearing, toxic-appearing or diaphoretic.  HENT:     Head: Normocephalic.     Right Ear: Tympanic membrane normal.     Left Ear: Tympanic membrane normal.     Nose: Nose normal.     Mouth/Throat:     Mouth: Mucous membranes are dry.     Pharynx: No oropharyngeal exudate or posterior oropharyngeal erythema.  Eyes:     Extraocular Movements: Extraocular movements intact.     Pupils: Pupils are equal, round, and reactive to light.  Cardiovascular:     Rate and Rhythm: Normal rate and regular rhythm.     Pulses: Normal pulses.     Heart sounds: Normal heart sounds.  Pulmonary:     Effort: Pulmonary effort is normal.     Breath sounds: Examination of the left-lower field reveals rales. Rales present.  Musculoskeletal:     Cervical back: Normal range of motion.  Lymphadenopathy:     Cervical: Cervical adenopathy present.     Left cervical: Superficial cervical adenopathy present.  Neurological:     General: No focal deficit present.     Mental Status: She is alert and oriented to person, place, and time. Mental status is at baseline.  Psychiatric:        Mood and Affect: Mood normal.        Behavior: Behavior normal.        Thought Content: Thought content normal.        Judgment: Judgment normal.          Results   Assessment & Plan:   Assessment and Plan Assessment & Plan Pneumonia Pneumonia with consideration of renal impairment. Doxycycline  chosen due to lower risk of renal complications. Avoidance of Augmentin  due to intolerance and Levaquin due to dosing concerns with dialysis.  Emphasized the importance of monitoring for symptoms due to her fragility and potential for rapid deterioration. - Prescribe doxycycline  100 mg twice daily for 10 days, repeat round.  -limited in options due to on dialysis, augmentin  not recommended with renal failure, could consider amox however I do not think this is fitting to help with pneumonia. Could consider levaquin however would use with caution and has renal dosing as well.  - Advise close follow-up in 7 days - Instruct  to go to ER if fever, worsening symptoms, or shortness of breath occur - Recommend Mucinex  without DM, pending dialysis team approval - Advise increased water intake  Congestive heart failure Dyspnea related to CHF managed with supplemental oxygen. Concerns about Hycodan use due to risk of respiratory depression while on oxygen. Coordination with cardiologist needed to determine ongoing management of supplemental oxygen. - Contact cardiologist regarding management of supplemental oxygen - Advise against use of Hycodan due to respiratory depression risk  End stage renal disease on dialysis End stage renal disease managed with dialysis. Consideration of medication interactions and side effects due to renal impairment. - Advise checking with dialysis team before starting Mucinex   Recording duration: 5 minutes      Return in about 1 week (around 02/18/2024) for follow up with me for close f/u pneumonia.     Ginger Patrick, MSN, APRN, FNP-C [facility]

## 2024-02-11 NOTE — Telephone Encounter (Signed)
 Good morning  Sorry in advance if you are the wrong person to reach out in regards to this but I hav ea Question. Pt is in between providers currently as Dr. Jimmy recently retired, and I have seen her today for pneumonia. She reports she uses supplemental oxygen prn CHF dyspnea? My question is do you manage her O2? I am trying to get her to the right person prior to needing re evaluation/refills of O2 as she awaits her new consult TOC.

## 2024-02-17 NOTE — Progress Notes (Unsigned)
 Cardiology Clinic Note   Date: 02/20/2024 ID: LOVELY KERINS, DOB [birth year], MRN [id number]  Primary Cardiologist:  Evalene Lunger, MD  Chief Complaint   Carrie [name] is a 73 y.o. female who presents to the clinic today for routine follow up.   Patient Profile   Carrie [name] is followed by Dr. Gollan for the history outlined below.       Past medical history significant for: Chronic diastolic heart failure. Limited echo 12/07/2021: EF 60 to 65%.  No RWMA.  Mild LVH.  Indeterminate diastolic parameters.  Normal RV size/function.  Small pericardial effusion estimated at 1 cm.  No significant valvular abnormalities. Chest pain. Nuclear stress test 10/24/2021: Low risk, probably normal stress test.  Small in size, mild in severity, reversible defect involving the apical septal and mid anteroseptal myocardium that is most likely artifact (shifting breast attenuation and RV insertion) and less likely ischemia.  Defect is new compared to study February 2020.  No significant coronary artery calcification noted.  There is a fluid density structure adjacent to the right atrium that is not significantly different compared to the prior study.  Also small fluid collection adjacent to the lateral wall of the left ventricle.  Findings are most consistent with loculated pericardial effusion, pericardial cyst felt to be less likely.  Sensitivity and specificity of study degraded by body habitus and extracardiac activity. Hypertension. Hyperlipidemia. ESRD. On HD MWF. Brain cancer. Seizures.  In summary, patient was first evaluated by Dr. Gollan on 11/20/2017 to establish care for heart failure and family history of CAD.  Patient reported worsening shortness of breath.  She had recently been started on Lasix  by PCP.  Echo May 2019 showed EF 60 to 65%, no RWMA, Grade I DD, mild LVH, mild AS mean gradient 12 mmHg, moderate LAE, normal RV size/function.  She reported tachycardia with exertion.  It was  recommended she start Toprol  25 mg daily.  She was instructed to decrease her fluid and sodium intake.  She was seen in February 2020 for preoperative evaluation prior to surgery for vascular access.  She reported persistent DOE.  She underwent nuclear stress testing for risk stratification risk stratification prior to surgery.  It was a low risk study with no EKG changes concerning for ischemia.  She had a dialysis permacath placed on 08/04/2018.  Patient was admitted to the hospital in November 2022 for volume overload.  Troponin mildly elevated with peak of 73.  She was seen in the office in May 2023 and reported chronic DOE with minimal activity such as walking 20 to 30 feet and occasional chest pressure that she had difficulty quantifying.  In the few weeks prior to her visit episodes were lasting approximately 20 minutes.  She underwent nuclear stress testing that was low risk as detailed above.  There was an incidental finding of possible pericardial effusion.  Follow-up echo July 2023 showed a small pericardial effusion felt to be clinically insignificant as detailed above.  Patient had hospital admission July 2024 for hypoxia/acute pulmonary edema.  She reported adherence to dialysis and fluid restrictions.  She was unable to be weaned from supplemental oxygen and was DC'd with home O2.  Patient was seen in the office on 01/01/2023 for routine visit.  She was not using home O2 at the time of her visit.  She had recently been started on hydralazine  25 mg 3 times a day.  BP borderline low at the time of her visit.  She was instructed  to monitor BP at home if continues to be low would consider holding hydralazine .   Patient was last seen in the office by me on 08/20/2023 for routine follow-up.  She was doing well at that time with no cardiac concerns.  Nephrology had stopped losartan  secondary to persistent cough.  Her BP was well-controlled at 134/80.     History of Present Illness    Today, patient is  accompanied by her sister. She is doing well from a cardiac standpoint. Patient denies shortness of breath, dyspnea on exertion, lower extremity edema, orthopnea or PND. No chest pain, pressure, or tightness. No palpitations. Her biggest complaint today is a 1.5 month history of persistent cough that is keeping her up at night. Cough is sometimes dry and sometimes productive. When she starts coughing she feels like she cannot stop. Her sister states she coughs so violently that it brings tears to her eyes. Patient states it makes her feel like she may pass out. At onset she was started on antibiotic and narcotic cough medication with no relief. She has tried an inhaler,  Flonase, over the counter mucinex , honey, cough drops, and a humidifier by her bed. She recently saw Carrie Patrick, FNP who started her on another round of antibiotic. Patient's d-dimer was elevated and CTA chest was ordered. BNP was 238. Patient feels her volume is well managed by dialysis. She states when she feels she is fluid up she will ask dialysis to pull more fluid off. She finds this is more helpful than taking extra torsemide .     ROS: All other systems reviewed and are otherwise negative except as noted in History of Present Illness.  EKGs/Labs Reviewed        EKG not ordered today.   02/18/2024: Hemoglobin 11.7; WBC 5.6   02/18/2024: TSH 1.76   02/18/2024: Pro B Natriuretic peptide (BNP) 238.0       Physical Exam    VS:  BP (!) 140/78   Pulse 76   Ht 5' 3 (1.6 m)   Wt 212 lb 12.8 oz (96.5 kg)   SpO2 94%   BMI 37.70 kg/m  , BMI Body mass index is 37.7 kg/m.  GEN: Well nourished, well developed, in no acute distress. Neck: No JVD or carotid bruits. Cardiac:  RRR.  No murmur. No rubs or gallops.   Respiratory:  Respirations regular and unlabored. Clear to auscultation without rales, wheezing or rhonchi. GI: Soft, nontender, nondistended. Extremities: Radials/DP/PT 2+ and equal bilaterally. No clubbing or  cyanosis. No edema.  Skin: Warm and dry, no rash. Neuro: Strength intact.  Assessment & Plan   Chronic diastolic heart failure Echo July 2024 showed normal LV/RV function, mild LVH, small pericardial effusion estimated at 1 cm, no significant valvular abnormalities.  She has slept in a recliner ever since her brain surgery. No PND or lower extremity edema. Losartan  was stopped by nephrology secondary to a persistent cough. Discussed BNP somewhat elevated at 238. She reports she does not feel she is volume up. She states if she does feel like she has extra fluid she will ask dialysis to take more fluid off. She feels this works much better at managing her fluid volume than taking extra torsemide .  Euvolemic and well compensated on exam. Will not increase torsemide  at this time. Patient will contact the office if she feels she's holding extra fluid or if she develops increased dyspnea.  -Continue Toprol , hydralazine , torsemide .   Hypertension BP today 140/78. Denies episodes of hypotension  around dialysis.  -Continue amlodipine , hydralazine , Toprol .   Hyperlipidemia No recent lipid panel.  She normally has her labs drawn at dialysis. She states she has been told that lipids are good. She is a hard stick and would prefer to not have labs drawn today. She will ask dialysis to print out her latest lipid panel and will  bring it to her next visit.  - Continue atorvastatin .   ESRD on HD Patient reports adherence to dialysis schedule.  She denies episodes of hypotension around dialysis.  - Continue to hold meds until after dialysis. - Continue to follow with nephrology.  Disposition: Return in 6 months or sooner as needed.          Signed, Carrie HERO. Anyelina Claycomb, DNP, NP-C

## 2024-02-18 ENCOUNTER — Ambulatory Visit: Payer: Self-pay | Admitting: Family

## 2024-02-18 ENCOUNTER — Ambulatory Visit: Admitting: Family

## 2024-02-18 ENCOUNTER — Ambulatory Visit (INDEPENDENT_AMBULATORY_CARE_PROVIDER_SITE_OTHER)
Admission: RE | Admit: 2024-02-18 | Discharge: 2024-02-18 | Disposition: A | Discharge status: Home / Self Care | Source: Ambulatory Visit | Attending: Family | Admitting: Family

## 2024-02-18 VITALS — BP 130/74 | HR 73 | Temp 98.6°F | Ht 62.5 in | Wt 212.0 lb

## 2024-02-18 DIAGNOSIS — T8089XA Other complications following infusion, transfusion and therapeutic injection, initial encounter: Secondary | ICD-10-CM

## 2024-02-18 DIAGNOSIS — J189 Pneumonia, unspecified organism: Secondary | ICD-10-CM | POA: Diagnosis not present

## 2024-02-18 DIAGNOSIS — N186 End stage renal disease: Secondary | ICD-10-CM

## 2024-02-18 DIAGNOSIS — R634 Abnormal weight loss: Secondary | ICD-10-CM

## 2024-02-18 DIAGNOSIS — R0902 Hypoxemia: Secondary | ICD-10-CM

## 2024-02-18 DIAGNOSIS — R7989 Other specified abnormal findings of blood chemistry: Secondary | ICD-10-CM

## 2024-02-18 DIAGNOSIS — I5032 Chronic diastolic (congestive) heart failure: Secondary | ICD-10-CM | POA: Diagnosis not present

## 2024-02-18 DIAGNOSIS — I509 Heart failure, unspecified: Secondary | ICD-10-CM

## 2024-02-18 DIAGNOSIS — Z992 Dependence on renal dialysis: Secondary | ICD-10-CM

## 2024-02-18 DIAGNOSIS — R053 Chronic cough: Secondary | ICD-10-CM

## 2024-02-18 DIAGNOSIS — R0609 Other forms of dyspnea: Secondary | ICD-10-CM

## 2024-02-18 DIAGNOSIS — Z9981 Dependence on supplemental oxygen: Secondary | ICD-10-CM

## 2024-02-18 NOTE — Progress Notes (Signed)
 Established Patient Office Visit  Subjective:      CC:  Chief Complaint  Patient presents with  . Cough    Has not improved much     HPI: Carrie [name] is a 73 y.o. female presenting on 02/18/2024 for Cough (Has not improved much ) .  Discussed the use of AI scribe software for clinical note transcription with the patient, who gave verbal consent to proceed.  History of Present Illness       Wt Readings from Last 3 Encounters:  02/18/24 212 lb (96.2 kg)  02/11/24 220 lb 6.4 oz (100 kg)  01/27/24 216 lb (98 kg)       Social history:  Relevant past medical, surgical, family and social history reviewed and updated as indicated. Interim medical history since our last visit reviewed.  Allergies and medications reviewed and updated.  DATA REVIEWED: CHART IN EPIC     ROS: Negative unless specifically indicated above in HPI.    Current Outpatient Medications:  .  acetaminophen  (TYLENOL ) 500 MG tablet, Take 1,000 mg by mouth every 6 (six) hours as needed for mild pain or moderate pain., Disp: , Rfl:  .  amLODipine  (NORVASC ) 10 MG tablet, Take 10 mg by mouth daily., Disp: , Rfl:  .  atorvastatin  (LIPITOR) 40 MG tablet, TAKE 40 MG BY MOUTH ONCE DAILY, Disp: 90 tablet, Rfl: 3 .  doxycycline  (VIBRA -TABS) 100 MG tablet, Take 1 tablet (100 mg total) by mouth 2 (two) times daily for 10 days., Disp: 20 tablet, Rfl: 0 .  hydrALAZINE  (APRESOLINE ) 25 MG tablet, TAKE 1 TABLET BY MOUTH THREE TIMES A DAY (Patient taking differently: Take 25 mg by mouth 3 (three) times daily. Patient reports reduced to BID by nephrologist), Disp: 270 tablet, Rfl: 1 .  levETIRAcetam  (KEPPRA ) 500 MG tablet, Take 1 tablet (500 mg total) by mouth 2 (two) times daily., Disp: 180 tablet, Rfl: 3 .  lidocaine -prilocaine  (EMLA ) cream, Apply 1 application topically every Monday, Wednesday, and Friday with hemodialysis., Disp: , Rfl:  .  LORazepam  (ATIVAN ) 0.5 MG tablet, TAKE 1 TABLET BY MOUTH TWICE A DAY  AS NEEDED FOR ANXIETY, Disp: 60 tablet, Rfl: 0 .  meclizine  (ANTIVERT ) 12.5 MG tablet, TAKE 1-2 TABLETS (12.5-25 MG TOTAL) BY MOUTH 3 (THREE) TIMES DAILY AS NEEDED FOR DIZZINESS., Disp: 60 tablet, Rfl: 1 .  metoprolol  succinate (TOPROL -XL) 50 MG 24 hr tablet, Take 25 mg by mouth daily., Disp: , Rfl:  .  multivitamin (RENA-VIT) TABS tablet, Take 1 tablet by mouth at bedtime., Disp: 30 tablet, Rfl: 0 .  omeprazole  (PRILOSEC) 20 MG capsule, Take 1 capsule (20 mg total) by mouth in the morning and at bedtime., Disp: 180 capsule, Rfl: 3 .  ondansetron  (ZOFRAN -ODT) 4 MG disintegrating tablet, Take 4 mg by mouth every 8 (eight) hours as needed for nausea or vomiting., Disp: , Rfl:  .  sevelamer  carbonate (RENVELA ) 800 MG tablet, Take 800-1,600 mg by mouth See admin instructions. Take 2 tablets (1600 mg) by mouth three times daily with meals and take 1 tablet (800 mg) by mouth twice daily with snacks, Disp: , Rfl:  .  torsemide  (DEMADEX ) 20 MG tablet, Take 20 mg by mouth daily. Every day (including on dialysis days), Disp: , Rfl:         Objective:        BP 130/74   Pulse 73   Temp 98.6 F (37 C) (Temporal)   Ht 5' 2.5 (1.588 m)   Wt 212  lb (96.2 kg)   SpO2 100% Comment: on 2 L o2  BMI 38.16 kg/m   Physical Exam   Wt Readings from Last 3 Encounters:  02/18/24 212 lb (96.2 kg)  02/11/24 220 lb 6.4 oz (100 kg)  01/27/24 216 lb (98 kg)    Physical Exam Constitutional:      General: She is not in acute distress.    Appearance: Normal appearance. She is normal weight. She is not ill-appearing, toxic-appearing or diaphoretic.  HENT:     Head: Normocephalic.     Right Ear: Tympanic membrane normal.     Left Ear: Tympanic membrane normal.     Nose: Nose normal.     Mouth/Throat:     Mouth: Mucous membranes are dry.     Pharynx: No oropharyngeal exudate or posterior oropharyngeal erythema.  Eyes:     Extraocular Movements: Extraocular movements intact.     Pupils: Pupils are  equal, round, and reactive to light.  Cardiovascular:     Rate and Rhythm: Normal rate and regular rhythm.     Pulses: Normal pulses.     Heart sounds: Normal heart sounds.  Pulmonary:     Effort: Pulmonary effort is normal. No respiratory distress.     Breath sounds: Normal breath sounds. No decreased air movement. No decreased breath sounds, wheezing, rhonchi or rales.  Musculoskeletal:        General: Normal range of motion.     Cervical back: Normal range of motion.  Neurological:     General: No focal deficit present.     Mental Status: She is alert and oriented to person, place, and time. Mental status is at baseline.  Psychiatric:        Mood and Affect: Mood normal.        Behavior: Behavior normal.        Thought Content: Thought content normal.        Judgment: Judgment normal.          Results   Assessment & Plan:   Assessment and Plan Assessment & Plan       Return in about 1 week (around 02/25/2024).     Ginger Patrick, MSN, APRN, FNP-C [facility]

## 2024-02-19 ENCOUNTER — Telehealth: Payer: Self-pay | Admitting: Internal Medicine

## 2024-02-19 LAB — CBC WITH DIFFERENTIAL/PLATELET
Basophils Absolute: 0.1 K/uL (ref 0.0–0.1)
Basophils Relative: 1.1 % (ref 0.0–3.0)
Eosinophils Absolute: 0.1 K/uL (ref 0.0–0.7)
Eosinophils Relative: 2.2 % (ref 0.0–5.0)
HCT: 35.3 % — ABNORMAL LOW (ref 36.0–46.0)
Hemoglobin: 11.7 g/dL — ABNORMAL LOW (ref 12.0–15.0)
Lymphocytes Relative: 30.1 % (ref 12.0–46.0)
Lymphs Abs: 1.7 K/uL (ref 0.7–4.0)
MCHC: 33 g/dL (ref 30.0–36.0)
MCV: 94.2 fl (ref 78.0–100.0)
Monocytes Absolute: 0.7 K/uL (ref 0.1–1.0)
Monocytes Relative: 12.2 % — ABNORMAL HIGH (ref 3.0–12.0)
Neutro Abs: 3.1 K/uL (ref 1.4–7.7)
Neutrophils Relative %: 54.4 % (ref 43.0–77.0)
Platelets: 279 K/uL (ref 150.0–400.0)
RBC: 3.75 Mil/uL — ABNORMAL LOW (ref 3.87–5.11)
RDW: 16.1 % — ABNORMAL HIGH (ref 11.5–15.5)
WBC: 5.6 K/uL (ref 4.0–10.5)

## 2024-02-19 LAB — TSH: TSH: 1.76 u[IU]/mL (ref 0.35–5.50)

## 2024-02-19 LAB — BRAIN NATRIURETIC PEPTIDE: Pro B Natriuretic peptide (BNP): 238 pg/mL — ABNORMAL HIGH (ref 0.0–100.0)

## 2024-02-19 LAB — D-DIMER, QUANTITATIVE: D-Dimer, Quant: 0.72 ug{FEU}/mL — ABNORMAL HIGH (ref <0.50)

## 2024-02-19 NOTE — Telephone Encounter (Signed)
 This was evaluated and documented in a separate note

## 2024-02-19 NOTE — Telephone Encounter (Signed)
 I just got a phone call from the on-call nurse. D-dimer is +. Were sending this message to [facility] FNP

## 2024-02-19 NOTE — Telephone Encounter (Signed)
 Sending note to ONEIDA Patrick FNP and [place] pool and teams [place] CMA.

## 2024-02-19 NOTE — Telephone Encounter (Signed)
 SABRA

## 2024-02-20 ENCOUNTER — Ambulatory Visit: Attending: Student | Admitting: Student

## 2024-02-20 ENCOUNTER — Encounter: Payer: Self-pay | Admitting: Student

## 2024-02-20 VITALS — BP 140/78 | HR 76 | Ht 63.0 in | Wt 212.8 lb

## 2024-02-20 DIAGNOSIS — E7800 Pure hypercholesterolemia, unspecified: Secondary | ICD-10-CM | POA: Diagnosis not present

## 2024-02-20 DIAGNOSIS — N186 End stage renal disease: Secondary | ICD-10-CM

## 2024-02-20 DIAGNOSIS — I10 Essential (primary) hypertension: Secondary | ICD-10-CM | POA: Diagnosis not present

## 2024-02-20 DIAGNOSIS — I5032 Chronic diastolic (congestive) heart failure: Secondary | ICD-10-CM | POA: Diagnosis not present

## 2024-02-20 DIAGNOSIS — Z992 Dependence on renal dialysis: Secondary | ICD-10-CM

## 2024-02-20 NOTE — Patient Instructions (Signed)
 Medication Instructions:  Your physician recommends that you continue on your current medications as directed. Please refer to the Current Medication list given to you today.   *If you need a refill on your cardiac medications before your next appointment, please call your pharmacy*  Lab Work: None ordered at this time  If you have labs (blood work) drawn today and your tests are completely normal, you will receive your results only by: MyChart Message (if you have MyChart) OR A paper copy in the mail If you have any lab test that is abnormal or we need to change your treatment, we will call you to review the results.  Testing/Procedures: None ordered at this time   Follow-Up: At [facility], you and your health needs are our priority.  As part of our continuing mission to provide you with exceptional heart care, our providers are all part of one team.  This team includes your primary Cardiologist (physician) and Advanced Practice Providers or APPs (Physician Assistants and Nurse Practitioners) who all work together to provide you with the care you need, when you need it.  Your next appointment:   6 month(s)  Provider:   Timothy Gollan, MD or Barnie Hila, NP    We recommend signing up for the patient portal called MyChart.  Sign up information is provided on this After Visit Summary.  MyChart is used to connect with patients for Virtual Visits (Telemedicine).  Patients are able to view lab/test results, encounter notes, upcoming appointments, etc.  Non-urgent messages can be sent to your provider as well.   To learn more about what you can do with MyChart, go to [email or web address].

## 2024-02-21 ENCOUNTER — Ambulatory Visit
Admission: RE | Admit: 2024-02-21 | Discharge: 2024-02-21 | Disposition: A | Discharge status: Home / Self Care | Source: Ambulatory Visit | Attending: Family | Admitting: Family

## 2024-02-21 DIAGNOSIS — R053 Chronic cough: Secondary | ICD-10-CM | POA: Insufficient documentation

## 2024-02-21 DIAGNOSIS — R0609 Other forms of dyspnea: Secondary | ICD-10-CM | POA: Diagnosis present

## 2024-02-21 DIAGNOSIS — R7989 Other specified abnormal findings of blood chemistry: Secondary | ICD-10-CM | POA: Diagnosis present

## 2024-02-21 DIAGNOSIS — I5032 Chronic diastolic (congestive) heart failure: Secondary | ICD-10-CM | POA: Insufficient documentation

## 2024-02-21 MED ORDER — IOHEXOL 350 MG/ML SOLN
75.0000 mL | Freq: Once | INTRAVENOUS | Status: AC | PRN
  Administered 2024-02-21: 75 mL via INTRAVENOUS

## 2024-02-25 ENCOUNTER — Telehealth: Payer: Self-pay | Admitting: Emergency Medicine

## 2024-02-25 DIAGNOSIS — I3131 Malignant pericardial effusion in diseases classified elsewhere: Secondary | ICD-10-CM

## 2024-02-25 NOTE — Telephone Encounter (Signed)
 Called and spoke with the patient.  Relayed the results of the CTA scan as interpreted by Barnie Hila, NP.  Informed the patient for the recommendation of scheduling an ECHO.  Patient agreeable to the recommendation.  Orders for ECHO placed.  Patient verbalized understanding with all questions and concerns addressed at this time.  Message forwarded to Scheduling.

## 2024-02-25 NOTE — Telephone Encounter (Signed)
-----   Message from Barnie Hila sent at 02/25/2024  7:32 AM EDT ----- Carrie [name],   Will you please place an order for echo for pericardial effusion for this patient? She will need to be contacted to let her know that her CTA was negative for PE but showed small to moderate amount of fluid around her heart.   This was seen on her last echo in 2023 but a repeat echo will help evaluate if there is any more fluid.   Thank you!  DW

## 2024-02-26 ENCOUNTER — Encounter: Payer: Self-pay | Admitting: Student in an Organized Health Care Education/Training Program

## 2024-02-26 ENCOUNTER — Ambulatory Visit: Admitting: Student in an Organized Health Care Education/Training Program

## 2024-02-26 VITALS — BP 126/80 | HR 68 | Temp 97.5°F | Ht 63.0 in | Wt 212.0 lb

## 2024-02-26 DIAGNOSIS — I5032 Chronic diastolic (congestive) heart failure: Secondary | ICD-10-CM | POA: Diagnosis not present

## 2024-02-26 DIAGNOSIS — N186 End stage renal disease: Secondary | ICD-10-CM

## 2024-02-26 DIAGNOSIS — R053 Chronic cough: Secondary | ICD-10-CM | POA: Diagnosis not present

## 2024-02-26 DIAGNOSIS — Z992 Dependence on renal dialysis: Secondary | ICD-10-CM | POA: Diagnosis not present

## 2024-02-26 LAB — NITRIC OXIDE: Nitric Oxide: 12

## 2024-02-26 MED ORDER — LORATADINE 10 MG PO TABS: 10.0000 mg | ORAL_TABLET | Freq: Every day | ORAL | 3 refills | Status: DC

## 2024-02-26 NOTE — Progress Notes (Unsigned)
 Synopsis: Referred in *** by Corwin Antu, FNP  Assessment & Plan:   1. Chronic cough (Primary)  Presents for the evaluation of chronic cough that started around 2 months ago and has persisted.  Her lungs today are clear on exam and FeNO was within normal.  She denies any history of smoking to suggest possibility of COPD and has not had any symptoms in the past to suggest asthma.  Her cough is worse at night and when she sleeps which could suggest a component of reflux associated cough.  For workup, I will obtain a pulmonary function test to conclusively rule out asthma.  I will also obtain a double contrast esophagogram to evaluate for any reflux or hiatal hernia.  I have counseled the patient on GERD consistent diet and recommended that she avoid any food intake up to 4 hours before she goes to sleep.  We will also give her an empiric trial of high-dose loratadine .I will evaluate her symptoms on follow-up in 3 months after this workup and these interventions.  - Nitric oxide  - DG ESOPHAGUS W DOUBLE CM (HD); Future - Pulmonary Function Test; Future - loratadine  (CLARITIN ) 10 MG tablet; Take 1 tablet (10 mg total) by mouth daily.  Dispense: 30 tablet; Refill: 3   Return in about 3 months (around 05/27/2024).  I spent *** minutes caring for this patient today, including {EM billing:[id number]}  Belva November, MD [facility] 02/26/2024 10:19 AM    End of visit medications:  Meds ordered this encounter  Medications   loratadine  (CLARITIN ) 10 MG tablet    Sig: Take 1 tablet (10 mg total) by mouth daily.    Dispense:  30 tablet    Refill:  3     Current Outpatient Medications:    acetaminophen  (TYLENOL ) 500 MG tablet, Take 1,000 mg by mouth every 6 (six) hours as needed for mild pain or moderate pain., Disp: , Rfl:    amLODipine  (NORVASC ) 10 MG tablet, Take 10 mg by mouth daily., Disp: , Rfl:    atorvastatin  (LIPITOR) 40 MG tablet, TAKE 40 MG BY MOUTH ONCE DAILY,  Disp: 90 tablet, Rfl: 3   hydrALAZINE  (APRESOLINE ) 25 MG tablet, TAKE 1 TABLET BY MOUTH THREE TIMES A DAY (Patient taking differently: Take 25 mg by mouth 3 (three) times daily. Patient reports reduced to BID by nephrologist), Disp: 270 tablet, Rfl: 1   levETIRAcetam  (KEPPRA ) 500 MG tablet, Take 1 tablet (500 mg total) by mouth 2 (two) times daily., Disp: 180 tablet, Rfl: 3   lidocaine -prilocaine  (EMLA ) cream, Apply 1 application topically every Monday, Wednesday, and Friday with hemodialysis., Disp: , Rfl:    loratadine  (CLARITIN ) 10 MG tablet, Take 1 tablet (10 mg total) by mouth daily., Disp: 30 tablet, Rfl: 3   LORazepam  (ATIVAN ) 0.5 MG tablet, TAKE 1 TABLET BY MOUTH TWICE A DAY AS NEEDED FOR ANXIETY, Disp: 60 tablet, Rfl: 0   meclizine  (ANTIVERT ) 12.5 MG tablet, TAKE 1-2 TABLETS (12.5-25 MG TOTAL) BY MOUTH 3 (THREE) TIMES DAILY AS NEEDED FOR DIZZINESS., Disp: 60 tablet, Rfl: 1   metoprolol  succinate (TOPROL -XL) 50 MG 24 hr tablet, Take 25 mg by mouth daily., Disp: , Rfl:    multivitamin (RENA-VIT) TABS tablet, Take 1 tablet by mouth at bedtime., Disp: 30 tablet, Rfl: 0   omeprazole  (PRILOSEC) 20 MG capsule, Take 1 capsule (20 mg total) by mouth in the morning and at bedtime., Disp: 180 capsule, Rfl: 3   ondansetron  (ZOFRAN -ODT) 4 MG disintegrating tablet, Take  4 mg by mouth every 8 (eight) hours as needed for nausea or vomiting., Disp: , Rfl:    sevelamer  carbonate (RENVELA ) 800 MG tablet, Take 800-1,600 mg by mouth See admin instructions. Take 2 tablets (1600 mg) by mouth three times daily with meals and take 1 tablet (800 mg) by mouth twice daily with snacks, Disp: , Rfl:    torsemide  (DEMADEX ) 20 MG tablet, Take 20 mg by mouth daily. Every day (including on dialysis days), Disp: , Rfl:    Subjective:   PATIENT ID: Carrie [name] GENDER: female DOB: 03/06/1951, MRN: [id number]  Chief Complaint  Patient presents with   Cough    Cough with clear sputum for 2 months. Occasional wheezing.  SOB.     HPI  Patient is a pleasant 73 year old female with a past medical history most notable for ESRD on dialysis who presents for the evaluation of chronic cough.  Patient reports that her cough symptoms started around a couple months ago.  It started with a cough that was productive of greenish sputum but has since persisted.  She continues to cough and the cough is now dry.  The cough is worse at night and when she is sleeping and is better during the day.  The cough does not fully resolve during the day, however.  She describes it as a violent outburst of cough that sometimes wakes her up from sleep and causes her eyes to tear.  This is very disturbing to the patient's routine.  She has not had any fevers, chills, night sweats, or weight loss.  She has not had any chest pain or chest tightness.  She denies any wheezing.  She has not had similar symptoms in the past.  Reviewed the patient's daily routine.  She wakes up at 3:30 and goes to have her dialysis sessions at 5:30 in the morning.  She is done with dialysis around 9 AM and goes back home.  She has breakfast and then goes back to sleep around 11 AM and wakes up around 4 PM.  She has dinner around 5 PM.  She sometimes has a snack around 8 PM.  She goes back to sleep around 11 PM.  She does enjoy herself some Sprite but denies drinking coffee or other acidic beverages.  Patient has been seen by her primary care provider and multiple interventions have been attempted.  She had a CT scan of the chest with PE protocol that ruled out pulmonary embolism last week.  The CT scan noted normal pulmonary parenchyma and no endobronchial or parenchymal lesions or findings to explain the cough.  Patient denies any history of smoking.  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 She denies any occupational exposures.  Ancillary information including prior medications, full  medical/surgical/family/social histories, and PFTs (when available) are listed below and have been reviewed.    ROS   Objective:   Vitals:   02/26/24 0947  BP: 126/80  Pulse: 68  Temp: (!) 97.5 F (36.4 C)  SpO2: 97%  Weight: 212 lb (96.2 kg)  Height: 5' 3 (1.6 m)   97% on RA  BMI Readings from Last 3 Encounters:  02/26/24 37.55 kg/m  02/20/24 37.70 kg/m  02/18/24 38.16 kg/m   Wt Readings from Last 3 Encounters:  02/26/24 212 lb (96.2 kg)  02/20/24 212 lb 12.8 oz (96.5 kg)  02/18/24 212 lb (96.2 kg)    Physical Exam    Ancillary Information  Past Medical History:  Diagnosis Date   Allergy    Anemia    Anxiety    Arthritis    right knee (10/26'/2017)   Chronic heart failure with preserved ejection fraction (HFpEF) (HCC)    a. 04/2021 Echo: EF >55%, GrI DD, nl RV fxn, triv MR; b. 12/2021 Echo: EF 60-65%, no rwma.   Demand ischemia (HCC)    a. 04/2021 HsTrop up to 73 in setting of volume overload; b. 10/2021 MV: EF 66% with small, mild reversible defect in the apical septal and mid anteroseptal myocardium which was felt to most likely represent artifact.   Depression    ESRD (end stage renal disease) (HCC)    a. OnHD (Dr. Woodward Brought)   GERD (gastroesophageal reflux disease)    History of blood transfusion 2008   when I had brain tumor surgery   History of MRSA infection 2008   History of stress test    a. 07/2018 MV: EF 66%, no ischemia/infarct.   Hypertension    Meningioma (HCC)    Foramen magnum   Oxygen deficiency    Pericardial effusion    a. 12/2021 Echo: EF 60-65%, no rwma, mild LVH, nl RV fxn, small pericardial effusion - small pocket of post wall, ~ 1cm.   Precordial chest pain    a. 10/2021 MV: low risk.   Type II diabetes mellitus (HCC) 2011   on metformin  until yr ago- no med now - off due to kidneys     Family History  Problem Relation Age of Onset   Hypertension Other        Strong family history    Breast cancer Mother 28    Heart failure Sister    Heart disease Sister      Past Surgical History:  Procedure Laterality Date   A/V FISTULAGRAM Left 03/10/2019   Procedure: A/V FISTULAGRAM;  Surgeon: Jama Cordella MATSU, MD;  Location: [facility];  Service: Cardiovascular;  Laterality: Left;   A/V FISTULAGRAM Left 07/14/2019   Procedure: A/V FISTULAGRAM;  Surgeon: Jama Cordella MATSU, MD;  Location: [facility];  Service: Cardiovascular;  Laterality: Left;   A/V FISTULAGRAM Left 01/07/2020   Procedure: A/V FISTULAGRAM;  Surgeon: Jama Cordella MATSU, MD;  Location: [facility];  Service: Cardiovascular;  Laterality: Left;   A/V FISTULAGRAM Left 11/14/2021   Procedure: A/V Fistulagram;  Surgeon: Jama Cordella MATSU, MD;  Location: [facility];  Service: Cardiovascular;  Laterality: Left;   A/V SHUNTOGRAM Left 10/17/2023   Procedure: A/V SHUNTOGRAM;  Surgeon: Marea Selinda RAMAN, MD;  Location: [facility];  Service: Cardiovascular;  Laterality: Left;   AV FISTULA PLACEMENT Left 11/07/2018   Procedure: ARTERIOVENOUS (AV) FISTULA CREATION ( BRACHIO BASILIC ) VS BRACHIAL AXILLARY GRAFT;  Surgeon: Jama Cordella MATSU, MD;  Location: [facility];  Service: Vascular;  Laterality: Left;   BRAIN MENINGIOMA EXCISION  08/2006   Foramina magnum meningioma   CERVICAL DISCECTOMY  1996   Dr. Yves   CESAREAN SECTION  1981   COLONOSCOPY     COLONOSCOPY WITH PROPOFOL  N/A 03/07/2021   Procedure: COLONOSCOPY WITH PROPOFOL ;  Surgeon: Janalyn Keene NOVAK, MD;  Location: [facility];  Service: Endoscopy;  Laterality: N/A;   DIALYSIS/PERMA CATHETER INSERTION N/A 08/04/2018   Procedure: DIALYSIS/PERMA CATHETER INSERTION;  Surgeon: Marea Selinda RAMAN, MD;  Location: [facility];  Service: Cardiovascular;  Laterality: N/A;   DIALYSIS/PERMA CATHETER REMOVAL N/A 12/25/2018   Procedure: DIALYSIS/PERMA CATHETER REMOVAL;  Surgeon: Marea Selinda RAMAN, MD;  Location: [facility];  Service:  Cardiovascular;  Laterality: N/A;   DILATION AND CURETTAGE OF UTERUS     JOINT REPLACEMENT     right knee left hip   MULTIPLE TOOTH EXTRACTIONS  01/30/2023   Right wrist x-ray  2007   Deg. at 1st MCP   TOTAL HIP ARTHROPLASTY Left 03/27/2016   Procedure: LEFT TOTAL HIP ARTHROPLASTY ANTERIOR APPROACH;  Surgeon: Lonni CINDERELLA Poli, MD;  Location: [facility] OR;  Service: Orthopedics;  Laterality: Left;   TOTAL KNEE ARTHROPLASTY Right 04/30/2017   Procedure: RIGHT TOTAL KNEE ARTHROPLASTY;  Surgeon: Poli Lonni CINDERELLA, MD;  Location: [facility];  Service: Orthopedics;  Laterality: Right;   TOTAL KNEE ARTHROPLASTY Left 03/13/2022   Procedure: LEFT TOTAL KNEE ARTHROPLASTY;  Surgeon: Poli Lonni CINDERELLA, MD;  Location: [facility] OR;  Service: Orthopedics;  Laterality: Left;   TUBAL LIGATION  1981    Social History   Socioeconomic History   Marital status: Widowed    Spouse name: Not on file   Number of children: 1   Years of education: Not on file   Highest education level: 12th grade  Occupational History   Occupation: Formerly Chief Operating Officer, 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      No living will   Requests niece, June Sorrel, as health care POA   Would accept resuscitation attempts   No tube feeds if cognitively aware   Social Drivers of Health   Financial Resource Strain: Low Risk  (01/02/2024)   Overall Financial Resource Strain (CARDIA)    Difficulty of Paying Living Expenses: Not hard at all  Recent Concern: Financial Resource Strain - High Risk (12/02/2023)   Overall  Financial Resource Strain (CARDIA)    Difficulty of Paying Living Expenses: Very hard  Food Insecurity: No Food Insecurity (01/30/2024)   Hunger Vital Sign    Worried About Running Out of Food in the Last Year: Never true    Ran Out of Food in the Last Year: Never true  Recent Concern: Food Insecurity - Food Insecurity Present (12/02/2023)   Hunger Vital Sign    Worried About Running Out of Food in the Last Year: Sometimes true    Ran Out of Food in the Last Year: Often true  Transportation Needs: No Transportation Needs (01/30/2024)   PRAPARE - Administrator, Civil Service (Medical): No    Lack of Transportation (Non-Medical): No  Physical Activity: Insufficiently Active (01/02/2024)   Exercise Vital Sign    Days of Exercise per Week: 1 day    Minutes of Exercise per Session: 10 min  Stress: No Stress Concern Present (01/02/2024)   [employer] of Occupational Health - Occupational Stress Questionnaire    Feeling of Stress: Not at all  Social Connections: Moderately Isolated (12/02/2023)   Social Connection and Isolation Panel    Frequency of Communication with Friends and Family: Once a week    Frequency of Social Gatherings with Friends and Family: More than  three times a week    Attends Religious Services: More than 4 times per year    Active Member of Clubs or Organizations: No    Attends Banker Meetings: Not on file    Marital Status: Widowed  Intimate Partner Violence: Not At Risk (01/30/2024)   Humiliation, Afraid, Rape, and Kick questionnaire    Fear of Current or Ex-Partner: No    Emotionally Abused: No    Physically Abused: No    Sexually Abused: No     Allergies  Allergen Reactions   Naproxen Sodium Anaphylaxis and Swelling   Sulfamethoxazole-Trimethoprim Anaphylaxis and Swelling     CBC    Component Value Date/Time   WBC 5.6 02/18/2024 1344   RBC 3.75 (L) 02/18/2024 1344   HGB 11.7 (L) 02/18/2024 1344   HCT 35.3 (L) 02/18/2024 1344    PLT 279.0 02/18/2024 1344   MCV 94.2 02/18/2024 1344   MCH 30.4 12/31/2022 0751   MCHC 33.0 02/18/2024 1344   RDW 16.1 (H) 02/18/2024 1344   LYMPHSABS 1.7 02/18/2024 1344   MONOABS 0.7 02/18/2024 1344   EOSABS 0.1 02/18/2024 1344   BASOSABS 0.1 02/18/2024 1344    Pulmonary Functions Testing Results:     No data to display          Outpatient Medications Prior to Visit  Medication Sig Dispense Refill   acetaminophen  (TYLENOL ) 500 MG tablet Take 1,000 mg by mouth every 6 (six) hours as needed for mild pain or moderate pain.     amLODipine  (NORVASC ) 10 MG tablet Take 10 mg by mouth daily.     atorvastatin  (LIPITOR) 40 MG tablet TAKE 40 MG BY MOUTH ONCE DAILY 90 tablet 3   hydrALAZINE  (APRESOLINE ) 25 MG tablet TAKE 1 TABLET BY MOUTH THREE TIMES A DAY (Patient taking differently: Take 25 mg by mouth 3 (three) times daily. Patient reports reduced to BID by nephrologist) 270 tablet 1   levETIRAcetam  (KEPPRA ) 500 MG tablet Take 1 tablet (500 mg total) by mouth 2 (two) times daily. 180 tablet 3   lidocaine -prilocaine  (EMLA ) cream Apply 1 application topically every Monday, Wednesday, and Friday with hemodialysis.     LORazepam  (ATIVAN ) 0.5 MG tablet TAKE 1 TABLET BY MOUTH TWICE A DAY AS NEEDED FOR ANXIETY 60 tablet 0   meclizine  (ANTIVERT ) 12.5 MG tablet TAKE 1-2 TABLETS (12.5-25 MG TOTAL) BY MOUTH 3 (THREE) TIMES DAILY AS NEEDED FOR DIZZINESS. 60 tablet 1   metoprolol  succinate (TOPROL -XL) 50 MG 24 hr tablet Take 25 mg by mouth daily.     multivitamin (RENA-VIT) TABS tablet Take 1 tablet by mouth at bedtime. 30 tablet 0   omeprazole  (PRILOSEC) 20 MG capsule Take 1 capsule (20 mg total) by mouth in the morning and at bedtime. 180 capsule 3   ondansetron  (ZOFRAN -ODT) 4 MG disintegrating tablet Take 4 mg by mouth every 8 (eight) hours as needed for nausea or vomiting.     sevelamer  carbonate (RENVELA ) 800 MG tablet Take 800-1,600 mg by mouth See admin instructions. Take 2 tablets (1600 mg)  by mouth three times daily with meals and take 1 tablet (800 mg) by mouth twice daily with snacks     torsemide  (DEMADEX ) 20 MG tablet Take 20 mg by mouth daily. Every day (including on dialysis days)     No facility-administered medications prior to visit.

## 2024-02-27 ENCOUNTER — Other Ambulatory Visit: Payer: Self-pay | Admitting: Internal Medicine

## 2024-02-27 ENCOUNTER — Other Ambulatory Visit: Payer: Self-pay

## 2024-02-27 MED ORDER — LORAZEPAM 0.5 MG PO TABS: 0.5000 mg | ORAL_TABLET | Freq: Two times a day (BID) | ORAL | 0 refills | Status: DC | PRN

## 2024-02-27 NOTE — Patient Instructions (Signed)
 Visit Information  Thank you for taking time to visit with me today. Please don't hesitate to contact me if I can be of assistance to you before our next scheduled appointment.  Your next care management appointment is by telephone on 03/26/2024 at 10:00 am  Telephone follow-up in 1 month  Please call the care guide team at [phone] if you need to cancel, schedule, or reschedule an appointment.   Please call the Suicide and Crisis Lifeline: 988 call the [place]  National Suicide Prevention Lifeline: (765)331-3845 or TTY: 215-570-6675 TTY (253)358-0305) to talk to a trained counselor call 1-800-273-TALK (toll free, 24 hour hotline) go to [facility] [address] [place] [phone]) call 911 if you are experiencing a Mental Health or Behavioral Health Crisis or need someone to talk to.  Carrie Duos, MSN, RN [place]  [facility], [facility] RN Care Manager Direct Dial : [phone] Fax: [phone]   GERD in Adults: Diet Changes When you have gastroesophageal reflux disease (GERD), you may need to make changes to your diet. Choosing the right foods can help with your symptoms. Think about working with an expert in healthy eating called a dietitian. They can help you make healthy food choices. What are tips for following this plan? Reading food labels Look for foods that are low in saturated fat. Foods that may help with your symptoms include: Foods with less than 5% of daily value (DV) of fat. Foods with 0 grams of trans fat. Cooking [employer] in ways that don't use a lot of fat. These ways include: Baking. Steaming. Grilling. Broiling. To add flavor, try to use herbs that are low in spice and acidity. Avoid frying your food. Meal planning  Eat small meals often rather than eating 3 large meals each day. Eat your meals slowly in a place where you feel relaxed. If told by your health care  provider, avoid: Foods that cause symptoms. Keep a food diary to keep track of foods that cause symptoms. Alcohol. Drinking a lot of liquid with meals. General instructions For 2-3 hours after you eat, avoid: Bending over. Exercise. Lying down. Chew sugar-free gum after meals. What foods should I eat? Eat a healthy diet. Try to include: Foods with high amounts of fiber. These include: Fruits and vegetables. Whole grains and beans. Low-fat dairy products. Lean meats, fish, and poultry. Egg whites. Foods that cause symptoms in someone else may not cause symptoms for you. Work with your provider to find foods that are safe for you. The items listed above may not be all the foods and drinks you can have. Talk with a dietitian to learn more. The items listed above may not be a complete list of foods and beverages you can eat and drink. Contact a dietitian for more information. What foods should I avoid? Limiting some of these foods may help with your symptoms. Each person is different. Talk with a dietitian or your provider to help you find the exact foods to avoid. Some of the foods to avoid may include: Fruits Fruits with a lot of acid in them. These may include citrus fruits, such as oranges, grapefruit, pineapple, and lemons. Vegetables Deep-fried vegetables, such as [place] fries. Vegetables, sauces, or toppings made with added fat and vegetables with acid in them. These may include tomatoes and tomato products, chili peppers, onions, garlic, and horseradish. Grains Pastries or quick breads with added fat. Meats and other proteins High-fat meats, such as fatty beef or pork,  hot dogs, ribs, ham, sausage, salami, and bacon. Fried meat or protein, such as fried fish and fried chicken. Egg yolks. Fats and oils Butter. Margarine. Shortening. Ghee. Drinks Coffee and other drinks with caffeine in them. Fizzy and sugary drinks, such as soda and energy drinks. Fruit juice made with acidic  fruits, such as orange or grapefruit. Tomato juice. Sweets and desserts Chocolate and cocoa. Donuts. Seasonings and condiments Mint, such as peppermint and spearmint. Condiments, herbs, or seasonings that cause symptoms. These may include curry, hot sauce, or vinegar-based salad dressings. The items listed above may not be all the foods and drinks you should avoid. Talk with a dietitian to learn more. Questions to ask your health care provider Changes to your diet and everyday life are often the first steps taken to manage symptoms of GERD. If these changes don't help, talk with your provider about taking medicines. Where to find more information International Foundation for Gastrointestinal Disorders: aboutgerd.org This information is not intended to replace advice given to you by your health care provider. Make sure you discuss any questions you have with your health care provider. Document Revised: 04/02/2023 Document Reviewed: 10/17/2022 Elsevier Patient Education  2024 Elsevier Inc.  Cough, Adult Coughing is a reflex that clears your throat and airways (respiratory system). It helps heal and protect your lungs. It is normal to cough from time to time. A cough that happens with other symptoms or that lasts a long time may be a sign of a condition that needs treatment. A short-term (acute) cough may only last 2-3 Carrie [name]. A long-term (chronic) cough may last 8 or more Carrie [name]. Coughing is often caused by: Diseases, such as: An infection of the respiratory system. Asthma or other heart or lung diseases. Gastroesophageal reflux. This is when acid comes back up from the stomach. Breathing in things that irritate your lungs. Allergies. Postnasal drip. This is when mucus runs down the back of your throat. Smoking. Some medicines. Follow these instructions at home: Medicines Take over-the-counter and prescription medicines only as told by your health care provider. Talk with your provider  before you take cough medicine (cough suppressants). Eating and drinking Do not drink alcohol. Avoid caffeine. Drink enough fluid to keep your pee (urine) pale yellow. Lifestyle Avoid cigarette smoke. Do not use any products that contain nicotine or tobacco. These products include cigarettes, chewing tobacco, and vaping devices, such as e-cigarettes. If you need help quitting, ask your provider. Avoid things that make you cough. These may include perfumes, candles, cleaning products, or campfire smoke. General instructions  Watch for any changes to your cough. Tell your provider about them. Always cover your mouth when you cough. If the air is dry in your bedroom or home, use a cool mist vaporizer or humidifier. If your cough is worse at night, try to sleep in a semi-upright position. Rest as needed. Contact a health care provider if: You have new symptoms, or your symptoms get worse. You cough up pus. You have a fever that does not go away or a cough that does not get better after 2-3 Carrie [name]. You cannot control your cough with medicine, and you are losing sleep. You have pain that gets worse or is not helped with medicine. You lose weight for no clear reason. You have night sweats. Get help right away if: You cough up blood. You have trouble breathing. Your heart is beating very fast. These symptoms may be an emergency. Get help right away. Call 911. Do not wait  to see if the symptoms will go away. Do not drive yourself to the hospital. This information is not intended to replace advice given to you by your health care provider. Make sure you discuss any questions you have with your health care provider. Document Revised: 01/19/2022 Document Reviewed: 01/19/2022 Elsevier Patient Education  2024 [employer].

## 2024-02-27 NOTE — Telephone Encounter (Unsigned)
 Copied from CRM #[id number]. Topic: Clinical - Medication Refill >> Feb 27, 2024  2:34 PM [place] J wrote: Medication:  LORazepam  (ATIVAN ) 0.5 MG tablet  Has the patient contacted their pharmacy? Yes (Agent: If no, request that the patient contact the pharmacy for the refill. If patient does not wish to contact the pharmacy document the reason why and proceed with request.) (Agent: If yes, when and what did the pharmacy advise?)  This is the patient's preferred pharmacy:  [facility] [address] [place] - [address] [place] [address] Phone: [phone] Fax: [phone]  Is this the correct pharmacy for this prescription? Yes If no, delete pharmacy and type the correct one.   Has the prescription been filled recently? No  Is the patient out of the medication? Yes  Has the patient been seen for an appointment in the last year OR does the patient have an upcoming appointment? Yes  Can we respond through MyChart? No  Agent: Please be advised that Rx refills may take up to 3 business days. We ask that you follow-up with your pharmacy.

## 2024-02-27 NOTE — Patient Outreach (Signed)
 Complex Care Management   Visit Note  02/27/2024  Name:  Carrie [name] MRN: [id number] DOB: 12/05/1950  Situation: Referral received for Complex Care Management related to Heart Failure and HTN I obtained verbal consent from Patient.  Visit completed with Patient  on the phone  Background:   Past Medical History:  Diagnosis Date   Allergy    Anemia    Anxiety    Arthritis    right knee (10/26'/2017)   Chronic heart failure with preserved ejection fraction (HFpEF) (HCC)    a. 04/2021 Echo: EF >55%, GrI DD, nl RV fxn, triv MR; b. 12/2021 Echo: EF 60-65%, no rwma.   Demand ischemia (HCC)    a. 04/2021 HsTrop up to 73 in setting of volume overload; b. 10/2021 MV: EF 66% with small, mild reversible defect in the apical septal and mid anteroseptal myocardium which was felt to most likely represent artifact.   Depression    ESRD (end stage renal disease) (HCC)    a. OnHD (Dr. Woodward Brought)   GERD (gastroesophageal reflux disease)    History of blood transfusion 2008   when I had brain tumor surgery   History of MRSA infection 2008   History of stress test    a. 07/2018 MV: EF 66%, no ischemia/infarct.   Hypertension    Meningioma (HCC)    Foramen magnum   Oxygen deficiency    Pericardial effusion    a. 12/2021 Echo: EF 60-65%, no rwma, mild LVH, nl RV fxn, small pericardial effusion - small pocket of post wall, ~ 1cm.   Precordial chest pain    a. 10/2021 MV: low risk.   Type II diabetes mellitus (HCC) 2011   on metformin  until yr ago- no med now - off due to kidneys    Assessment: Patient Reported Symptoms:  Cognitive Cognitive Status: No symptoms reported Cognitive/Intellectual Conditions Management [RPT]: None reported or documented in medical history or problem list   Health Maintenance Behaviors: Annual physical exam, Healthy diet, Social activities, Spiritual practice(s) Health Facilitated by: Prayer/meditation, Healthy diet  Neurological Neurological Review of  Symptoms: Not assessed Neurological Management Strategies: Medication therapy, Routine screening  HEENT HEENT Symptoms Reported: Not assessed HEENT Management Strategies: Routine screening, Medication therapy    Cardiovascular Cardiovascular Symptoms Reported: No symptoms reported Does patient have uncontrolled Hypertension?: No Cardiovascular Management Strategies: Medication therapy, Routine screening Weight: 212 lb (96.2 kg) Cardiovascular Comment: ECHO for mod pericard effusion denies sx BP 133/77  Respiratory Other Respiratory Symptoms: cough persists, rare wheeze with fam hx asthma, cough brings tears to eyes, no vomiting, interferes with sleep, saw Pulm, will have esophagram 10/7 and PFT 12/17 w/pulm FU, uses cough drops, tea with honey, will try plain honey, GERD diet, avoid eating 4 h before sleep, starting loratadine , not needing O2 this week Respiratory Management Strategies: Routine screening, Oxygen therapy, Medication therapy  Endocrine Endocrine Symptoms Reported: Not assessed    Gastrointestinal Gastrointestinal Symptoms Reported: No symptoms reported Gastrointestinal Management Strategies: Diet modification, Fluid modification    Genitourinary   Genitourinary Management Strategies: Hemodialysis Hemodialysis Last Treatment: 02/26/24 Genitourinary Comment: no graft issues, states weight has been up and down, at times needing to take off more fluid, phosphorous was up to 8, usu 4-5,  Integumentary Integumentary Symptoms Reported: No symptoms reported Skin Management Strategies: Routine screening  Musculoskeletal Musculoskelatal Symptoms Reviewed: No symptoms reported Additional Musculoskeletal Details: fall coming out of bathroom, went to grab shoe to kill bug and fell backwards onto bottom, no injury fall  precautions discussed Musculoskeletal Management Strategies: Routine screening, Medication therapy, Diet modification, Fluid modification Falls in the past year?:  Yes Number of falls in past year: 2 or more Was there an injury with Fall?: No Fall Risk Category Calculator: 2 Patient Fall Risk Level: Moderate Fall Risk Patient at Risk for Falls Due to: History of fall(s), Impaired balance/gait Fall risk Follow up: Falls evaluation completed, Education provided, Falls prevention discussed  Psychosocial Psychosocial Symptoms Reported: Depression - if selected complete PHQ 2-9 Additional Psychological Details: some depression related to cough, lorazepam  helps overall with mood Behavioral Management Strategies: Medication therapy, Support system, Adequate rest Major Change/Loss/Stressor/Fears (CP): Medical condition, self, Medical condition, family      02/27/2024    PHQ2-9 Depression Screening   Little interest or pleasure in doing things Not at all  Feeling down, depressed, or hopeless Not at all  PHQ-2 - Total Score 0  Trouble falling or staying asleep, or sleeping too much    Feeling tired or having little energy    Poor appetite or overeating     Feeling bad about yourself - or that you are a failure or have let yourself or your family down    Trouble concentrating on things, such as reading the newspaper or watching television    Moving or speaking so slowly that other people could have noticed.  Or the opposite - being so fidgety or restless that you have been moving around a lot more than usual    Thoughts that you would be better off dead, or hurting yourself in some way    PHQ2-9 Total Score    If you checked off any problems, how difficult have these problems made it for you to do your work, take care of things at home, or get along with other people    Depression Interventions/Treatment      Vitals:   02/27/24 1112  BP: 133/77    Medications Reviewed Today     Reviewed by Devra Lands, RN (Registered Nurse) on 02/27/24 at 1118  Med List Status: <None>   Medication Order Taking? Sig Documenting Provider Last Dose Status  Informant  acetaminophen  (TYLENOL ) 500 MG tablet [id number] Yes Take 1,000 mg by mouth every 6 (six) hours as needed for mild pain or moderate pain. [provider]  Active Self  amLODipine  (NORVASC ) 10 MG tablet [id number] Yes Take 10 mg by mouth daily. [provider]  Active Self  atorvastatin  (LIPITOR) 40 MG tablet [id number] Yes TAKE 40 MG BY MOUTH ONCE DAILY Jimmy Charlie FERNS, MD  Active Self  hydrALAZINE  (APRESOLINE ) 25 MG tablet [id number] Yes TAKE 1 TABLET BY MOUTH THREE TIMES A DAY  Patient taking differently: Take 25 mg by mouth 3 (three) times daily. Patient reports reduced to BID by nephrologist   Jimmy Charlie FERNS, MD  Active   levETIRAcetam  (KEPPRA ) 500 MG tablet [id number] Yes Take 1 tablet (500 mg total) by mouth 2 (two) times daily. Jimmy Charlie FERNS, MD  Active   lidocaine -prilocaine  (EMLA ) cream [id number] Yes Apply 1 application topically every Monday, Wednesday, and Friday with hemodialysis. [provider]  Active Self  loratadine  (CLARITIN ) 10 MG tablet [id number] Yes Take 1 tablet (10 mg total) by mouth daily. Isadora Hose, MD  Active   LORazepam  (ATIVAN ) 0.5 MG tablet [id number] Yes TAKE 1 TABLET BY MOUTH TWICE A DAY AS NEEDED FOR ANXIETY Letvak, Richard I, MD  Active   meclizine  (ANTIVERT ) 12.5 MG tablet [id number] Yes TAKE 1-2 TABLETS (12.5-25 MG TOTAL)  BY MOUTH 3 (THREE) TIMES DAILY AS NEEDED FOR DIZZINESS. Letvak, Richard I, MD  Active   metoprolol  succinate (TOPROL -XL) 50 MG 24 hr tablet [id number] Yes Take 25 mg by mouth daily. [provider]  Active Self  multivitamin (RENA-VIT) TABS tablet [id number] Yes Take 1 tablet by mouth at bedtime. Josette Ade, MD  Active Self  omeprazole  (PRILOSEC) 20 MG capsule [id number] Yes Take 1 capsule (20 mg total) by mouth in the morning and at bedtime. Letvak, Richard I, MD  Active   ondansetron  (ZOFRAN -ODT) 4 MG disintegrating tablet [id number] Yes Take 4 mg by mouth every 8 (eight) hours as needed for  nausea or vomiting. [provider]  Active Self  sevelamer  carbonate (RENVELA ) 800 MG tablet [id number] Yes Take 800-1,600 mg by mouth See admin instructions. Take 2 tablets (1600 mg) by mouth three times daily with meals and take 1 tablet (800 mg) by mouth twice daily with snacks [provider]  Active Self  torsemide  (DEMADEX ) 20 MG tablet [id number] Yes Take 20 mg by mouth daily. Every day (including on dialysis days) [provider]  Active Self            Recommendation:   PCP Follow-up Continue Current Plan of Care  Follow Up Plan:   Telephone follow-up in 1 month  Nestora Duos, MSN, RN [facility]  [facility], [facility] RN Care Manager Direct Dial : [phone] Fax: [phone]

## 2024-03-05 ENCOUNTER — Ambulatory Visit
Admission: RE | Admit: 2024-03-05 | Discharge: 2024-03-05 | Disposition: A | Discharge status: Home / Self Care | Source: Ambulatory Visit | Attending: Student in an Organized Health Care Education/Training Program | Admitting: Student in an Organized Health Care Education/Training Program

## 2024-03-05 DIAGNOSIS — R053 Chronic cough: Secondary | ICD-10-CM | POA: Insufficient documentation

## 2024-03-10 ENCOUNTER — Other Ambulatory Visit: Payer: Self-pay

## 2024-03-10 ENCOUNTER — Other Ambulatory Visit

## 2024-03-10 MED ORDER — LEVETIRACETAM 500 MG PO TABS: 500.0000 mg | ORAL_TABLET | Freq: Two times a day (BID) | ORAL | 1 refills | Status: AC

## 2024-03-10 NOTE — Telephone Encounter (Signed)
 You saw pt for a problem visit and she is scheduled to do a TOC soon.

## 2024-03-24 ENCOUNTER — Telehealth (INDEPENDENT_AMBULATORY_CARE_PROVIDER_SITE_OTHER): Payer: Self-pay

## 2024-03-24 NOTE — Telephone Encounter (Signed)
 Spoke with the patient and she is scheduled with Dr. Marea for a left arm fistulagram on 04/02/24 with a 11:00 am arrival time to the [facility]. Pre-procedure instructions were discussed and will be be sent to Mychart and mailed.

## 2024-03-26 ENCOUNTER — Other Ambulatory Visit: Payer: Self-pay

## 2024-03-26 ENCOUNTER — Ambulatory Visit: Admitting: Student in an Organized Health Care Education/Training Program

## 2024-03-26 NOTE — Patient Instructions (Signed)
 Visit Information  Thank you for taking time to visit with me today. Please don't hesitate to contact me if I can be of assistance to you before our next scheduled appointment.  Your next care management appointment is by telephone on 04/23/2024 at 9:00 am  Telephone follow-up in 1 month  Please call the care guide team at [phone] if you need to cancel, schedule, or reschedule an appointment.   Please call the Suicide and Crisis Lifeline: 988 call the [place]  National Suicide Prevention Lifeline: (319) 518-4905 or TTY: 202-232-2469 TTY 225-359-3636) to talk to a trained counselor call 1-800-273-TALK (toll free, 24 hour hotline) go to [facility] [address], [place] [phone]) call 911 if you are experiencing a Mental Health or Behavioral Health Crisis or need someone to talk to.  Nestora Duos, MSN, RN [facility], [facility] RN Care Manager Direct Dial : [phone] Fax: [phone]

## 2024-04-02 ENCOUNTER — Ambulatory Visit: Admission: RE | Admit: 2024-04-02 | Source: Home / Self Care | Admitting: Vascular Surgery

## 2024-04-02 ENCOUNTER — Encounter: Admission: RE | Payer: Self-pay | Source: Home / Self Care

## 2024-04-02 DIAGNOSIS — N186 End stage renal disease: Secondary | ICD-10-CM

## 2024-04-02 SURGERY — A/V FISTULAGRAM
Anesthesia: Moderate Sedation | Laterality: Left

## 2024-04-03 ENCOUNTER — Ambulatory Visit: Payer: Self-pay | Admitting: Student in an Organized Health Care Education/Training Program

## 2024-04-07 ENCOUNTER — Ambulatory Visit: Attending: Student

## 2024-04-07 DIAGNOSIS — I340 Nonrheumatic mitral (valve) insufficiency: Secondary | ICD-10-CM

## 2024-04-07 DIAGNOSIS — I3131 Malignant pericardial effusion in diseases classified elsewhere: Secondary | ICD-10-CM | POA: Diagnosis not present

## 2024-04-07 DIAGNOSIS — I3139 Other pericardial effusion (noninflammatory): Secondary | ICD-10-CM | POA: Diagnosis not present

## 2024-04-08 LAB — ECHOCARDIOGRAM COMPLETE
AR max vel: 2.29 cm2
AV Area VTI: 2.34 cm2
AV Area mean vel: 2.26 cm2
AV Mean grad: 9 mmHg
AV Peak grad: 15.1 mmHg
Ao pk vel: 1.94 m/s
Area-P 1/2: 3.37 cm2
S' Lateral: 2.8 cm

## 2024-04-09 ENCOUNTER — Ambulatory Visit: Payer: Self-pay | Admitting: Student

## 2024-04-09 NOTE — Progress Notes (Signed)
 Last read by Ethelmae D Tischer at 8:16AM on 04/09/2024.

## 2024-04-20 ENCOUNTER — Other Ambulatory Visit: Payer: Self-pay | Admitting: Family

## 2024-04-23 ENCOUNTER — Other Ambulatory Visit: Payer: Self-pay

## 2024-04-23 NOTE — Patient Instructions (Signed)
 Visit Information  Thank you for taking time to visit with me today. Please don't hesitate to contact me if I can be of assistance to you before our next scheduled appointment.  Your next care management appointment is by telephone on 05/21/2024 at 9:00 am  Telephone follow-up in 1 month  Please call the care guide team at [phone] if you need to cancel, schedule, or reschedule an appointment.   Please call the Suicide and Crisis Lifeline: 988 call the [place]  National Suicide Prevention Lifeline: 817-013-7899 or TTY: 562-865-9658 TTY 516-489-8756) to talk to a trained counselor call 1-800-273-TALK (toll free, 24 hour hotline) go to [facility] [address], [place] [phone]) call 911 if you are experiencing a Mental Health or Behavioral Health Crisis or need someone to talk to.  Nestora Duos, MSN, RN [facility], [facility] RN Care Manager Direct Dial : [phone] Fax: [phone]

## 2024-04-23 NOTE — Patient Outreach (Signed)
 Complex Care Management   Visit Note  04/23/2024  Name:  Carrie [name] MRN: [id number] DOB: 1950/07/31  Situation: Referral received for Complex Care Management related to Heart Failure and HTN I obtained verbal consent from Patient.  Visit completed with Patient  on the phone  Background:   Past Medical History:  Diagnosis Date   Allergy    Anemia    Anxiety    Arthritis    right knee (10/26'/2017)   Chronic heart failure with preserved ejection fraction (HFpEF) (HCC)    a. 04/2021 Echo: EF >55%, GrI DD, nl RV fxn, triv MR; b. 12/2021 Echo: EF 60-65%, no rwma.   Demand ischemia (HCC)    a. 04/2021 HsTrop up to 73 in setting of volume overload; b. 10/2021 MV: EF 66% with small, mild reversible defect in the apical septal and mid anteroseptal myocardium which was felt to most likely represent artifact.   Depression    ESRD (end stage renal disease) (HCC)    a. OnHD (Dr. Woodward Brought)   GERD (gastroesophageal reflux disease)    History of blood transfusion 2008   when I had brain tumor surgery   History of MRSA infection 2008   History of stress test    a. 07/2018 MV: EF 66%, no ischemia/infarct.   Hypertension    Meningioma (HCC)    Foramen magnum   Oxygen deficiency    Pericardial effusion    a. 12/2021 Echo: EF 60-65%, no rwma, mild LVH, nl RV fxn, small pericardial effusion - small pocket of post wall, ~ 1cm.   Precordial chest pain    a. 10/2021 MV: low risk.   Type II diabetes mellitus (HCC) 2011   on metformin  until yr ago- no med now - off due to kidneys    Assessment: Patient Reported Symptoms:  Cognitive Cognitive Status: No symptoms reported Cognitive/Intellectual Conditions Management [RPT]: None reported or documented in medical history or problem list   Health Maintenance Behaviors: Annual physical exam, Immunizations, Healthy diet  Neurological Neurological Review of Symptoms: Other: Neurological Management Strategies: Medication  therapy Neurological Comment: vertigo - will request refill of meclizine  - pharm says no refills, seizures controlled  HEENT HEENT Symptoms Reported: Not assessed      Cardiovascular Cardiovascular Symptoms Reported: No symptoms reported Does patient have uncontrolled Hypertension?: No Cardiovascular Management Strategies: Medication therapy, Routine screening Weight: 208 lb 5.4 oz (94.5 kg) Cardiovascular Comment: 166/88 in dialysis yesterday before treatment then 140/80 post, weight stable  Respiratory Respiratory Symptoms Reported: No symptoms reported Other Respiratory Symptoms: had pneumoniavx and TB test Respiratory Management Strategies: Routine screening, Medication therapy  Endocrine Endocrine Symptoms Reported: Not assessed Is patient diabetic?: No    Gastrointestinal Additional Gastrointestinal Details: swallowing study - no reflux reports swallowing better Gastrointestinal Management Strategies: Diet modification, Fluid modification Gastrointestinal Comment: 32 oz fluid restrict maintained, weight stable, appetite unchanged    Genitourinary Genitourinary Symptoms Reported: No symptoms reported Genitourinary Management Strategies: Hemodialysis Hemodialysis Schedule: MWF [facility] Last Treatment: 04/22/24 Genitourinary Comment: no issues with L AVF - fistulagram cancelled  Integumentary Integumentary Symptoms Reported: No symptoms reported    Musculoskeletal Musculoskelatal Symptoms Reviewed: No symptoms reported Additional Musculoskeletal Details: no cane/walker vertogo and no falls Musculoskeletal Management Strategies: Routine screening, Medication therapy, Diet modification, Fluid modification Falls in the past year?: Yes Number of falls in past year: 2 or more Patient at Risk for Falls Due to: History of fall(s), Impaired balance/gait Fall risk Follow up: Falls evaluation completed, Falls  prevention discussed  Psychosocial Additional Psychological  Details: today is daughter's birthday (passed) doing good - prays and support from family and dialysis family Behavioral Management Strategies: Support group, Adequate rest Major Change/Loss/Stressor/Fears (CP): Death of a loved one, Medical condition, self, Medical condition, family      04/23/2024    PHQ2-9 Depression Screening   Little interest or pleasure in doing things Not at all  Feeling down, depressed, or hopeless Not at all  PHQ-2 - Total Score 0  Trouble falling or staying asleep, or sleeping too much    Feeling tired or having little energy    Poor appetite or overeating     Feeling bad about yourself - or that you are a failure or have let yourself or your family down    Trouble concentrating on things, such as reading the newspaper or watching television    Moving or speaking so slowly that other people could have noticed.  Or the opposite - being so fidgety or restless that you have been moving around a lot more than usual    Thoughts that you would be better off dead, or hurting yourself in some way    PHQ2-9 Total Score    If you checked off any problems, how difficult have these problems made it for you to do your work, take care of things at home, or get along with other people    Depression Interventions/Treatment      Today's Vitals   04/23/24 0911  Weight: 208 lb 5.4 oz (94.5 kg)      Medications Reviewed Today     Reviewed by Devra Lands, RN (Registered Nurse) on 04/23/24 at [phone]  Med List Status: <None>   Medication Order Taking? Sig Documenting Provider Last Dose Status Informant  acetaminophen  (TYLENOL ) 500 MG tablet [id number] Yes Take 1,000 mg by mouth every 6 (six) hours as needed for mild pain or moderate pain. [provider]  Active Self  amLODipine  (NORVASC ) 10 MG tablet [id number] Yes Take 10 mg by mouth daily. [provider]  Active Self  atorvastatin  (LIPITOR) 40 MG tablet [id number] Yes TAKE 40 MG BY MOUTH ONCE DAILY  Jimmy Charlie FERNS, MD  Active Self  hydrALAZINE  (APRESOLINE ) 25 MG tablet [id number] Yes TAKE 1 TABLET BY MOUTH THREE TIMES A DAY  Patient taking differently: Take 25 mg by mouth 3 (three) times daily. Patient reports reduced to BID by nephrologist   Jimmy Charlie FERNS, MD  Active   levETIRAcetam  (KEPPRA ) 500 MG tablet [id number] Yes Take 1 tablet (500 mg total) by mouth 2 (two) times daily. Bowa, Nahosi K, MD  Active   lidocaine -prilocaine  (EMLA ) cream [id number] Yes Apply 1 application topically every Monday, Wednesday, and Friday with hemodialysis. [provider]  Active Self  loratadine  (CLARITIN ) 10 MG tablet [id number] Yes Take 1 tablet (10 mg total) by mouth daily. Isadora Hose, MD  Active   LORazepam  (ATIVAN ) 0.5 MG tablet [id number] Yes Take 1 tablet (0.5 mg total) by mouth 2 (two) times daily as needed for anxiety. Webb, Padonda B, FNP  Active   meclizine  (ANTIVERT ) 12.5 MG tablet [id number] Yes TAKE 1-2 TABLETS (12.5-25 MG TOTAL) BY MOUTH 3 (THREE) TIMES DAILY AS NEEDED FOR DIZZINESS. Letvak, Richard I, MD  Active   metoprolol  succinate (TOPROL -XL) 50 MG 24 hr tablet [id number] Yes Take 25 mg by mouth daily. [provider]  Active Self  multivitamin (RENA-VIT) TABS tablet [id number] Yes Take 1 tablet by mouth at bedtime. Josette Charlie, MD  Active Self  omeprazole  (PRILOSEC) 20 MG capsule [id number] Yes Take 1 capsule (20 mg total) by mouth in the morning and at bedtime. Letvak, Richard I, MD  Active   ondansetron  (ZOFRAN -ODT) 4 MG disintegrating tablet [id number]  Take 4 mg by mouth every 8 (eight) hours as needed for nausea or vomiting.  Patient not taking: Reported on 04/23/2024   [provider]  Active Self  sevelamer  carbonate (RENVELA ) 800 MG tablet [id number] Yes Take 800-1,600 mg by mouth See admin instructions. Take 2 tablets (1600 mg) by mouth three times daily with meals and take 1 tablet (800 mg) by mouth twice daily with snacks [provider]   Active Self  torsemide  (DEMADEX ) 20 MG tablet [id number] Yes Take 20 mg by mouth daily. Every day (including on dialysis days) [provider]  Active Self            Recommendation:   PCP Follow-up Continue Current Plan of Care  Follow Up Plan:   Telephone follow-up in 1 month  Nestora Duos, MSN, RN [facility]  [facility], [facility] RN Care Manager Direct Dial : [phone] Fax: [phone]

## 2024-04-28 ENCOUNTER — Ambulatory Visit (INDEPENDENT_AMBULATORY_CARE_PROVIDER_SITE_OTHER)

## 2024-04-28 ENCOUNTER — Other Ambulatory Visit: Payer: Self-pay

## 2024-04-28 VITALS — BP 132/84 | HR 79 | Temp 98.2°F | Ht 63.0 in | Wt 208.0 lb

## 2024-04-28 DIAGNOSIS — Z9981 Dependence on supplemental oxygen: Secondary | ICD-10-CM | POA: Insufficient documentation

## 2024-04-28 DIAGNOSIS — R42 Dizziness and giddiness: Secondary | ICD-10-CM | POA: Diagnosis not present

## 2024-04-28 DIAGNOSIS — F419 Anxiety disorder, unspecified: Secondary | ICD-10-CM | POA: Insufficient documentation

## 2024-04-28 DIAGNOSIS — J309 Allergic rhinitis, unspecified: Secondary | ICD-10-CM

## 2024-04-28 MED ORDER — HYDROXYZINE PAMOATE 25 MG PO CAPS: ORAL_CAPSULE | ORAL | 0 refills | Status: DC

## 2024-04-28 MED ORDER — LORATADINE 10 MG PO TABS: 10.0000 mg | ORAL_TABLET | Freq: Every day | ORAL | 0 refills | Status: AC

## 2024-04-28 MED ORDER — MECLIZINE HCL 25 MG PO TABS: 25.0000 mg | ORAL_TABLET | Freq: Three times a day (TID) | ORAL | 0 refills | Status: AC | PRN

## 2024-04-28 NOTE — Progress Notes (Signed)
 Subjective:   This visit was conducted in person. The patient gave informed consent to the use of Abridge AI technology to record the contents of the encounter as documented below.   Patient ID: Carrie [name], female    DOB: January 28, 1951, 73 y.o.   MRN: [id number]   Discussed the use of AI scribe software for clinical note transcription with the patient, who gave verbal consent to proceed.  History of Present Illness Carrie [name] is a 73 year old female who presents for medication management and follow-up.  She is on hemodialysis three times a week (Monday, Wednesday, Friday) from 5:00 to 9:15 AM. She has a history of heart issues and has seen cardiology recently. 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  She has a history of seizures but has not experienced any recent episodes. She continues to take Keppra .  She experiences episodes of dizziness, which were attributed to vertigo. She takes meclizine  as needed, two tablets every eight hours, and finds it helpful. She recently ran out of medication and is awaiting a refill.  She has a history of anemia related to her kidney condition and has experienced episodes of dizziness. She takes Renvela , a phosphate binder, two tablets three times a day with meals and one tablet with snacks. She also takes torsemide  daily, Prilosec twice daily, a multivitamin, and Toprol , which she cuts in half to take 25 mg daily.  She has a history of anxiety, particularly after the loss of her daughter and husband in the same year. She uses lorazepam  as needed for anxiety, particularly when she feels it building up. It helps her rest, especially when she has trouble sleeping, but she is open to trying alternative medications.  She has a history of high cholesterol and takes Lipitor daily. She also takes amlodipine  for blood pressure management and uses Tylenol  as needed for pain, particularly after dialysis sessions.  She has a  history of allergies and takes Claritin  daily, although she recently ran out and needs a refill. She also takes hydralazine  twice daily, which was reduced by her kidney doctor.  Her reflux symptoms are well controlled with Prilosec.  She has been evaluated by a lung doctor for a past cough and is scheduled for a breathing test. She uses oxygen as needed, particularly after dialysis when she feels winded, but not regularly.   Review of Systems  All other systems reviewed and are negative.       Allergies  Allergen Reactions   Naproxen Sodium Anaphylaxis and Swelling   Sulfamethoxazole-Trimethoprim Anaphylaxis and Swelling    Current Outpatient Medications on File Prior to Visit  Medication Sig Dispense Refill   acetaminophen  (TYLENOL ) 500 MG tablet Take 1,000 mg by mouth every 6 (six) hours as needed for mild pain or moderate pain.     amLODipine  (NORVASC ) 10 MG tablet Take 10 mg by mouth daily.     atorvastatin  (LIPITOR) 40 MG tablet TAKE 40 MG BY MOUTH ONCE DAILY 90 tablet 3   hydrALAZINE  (APRESOLINE ) 25 MG tablet TAKE 1 TABLET BY MOUTH THREE TIMES A DAY (Patient taking differently: Take 25 mg by mouth 3 (three) times daily. Patient reports reduced to BID by nephrologist) 270 tablet 1   levETIRAcetam  (KEPPRA ) 500 MG tablet Take 1 tablet (500 mg total) by mouth 2 (two) times daily. 180 tablet 1   lidocaine -prilocaine  (EMLA ) cream Apply 1 application topically every Monday, Wednesday, and Friday with hemodialysis.     LORazepam  (  ATIVAN ) 0.5 MG tablet Take 1 tablet (0.5 mg total) by mouth 2 (two) times daily as needed for anxiety. 60 tablet 0   meclizine  (ANTIVERT ) 12.5 MG tablet TAKE 1-2 TABLETS (12.5-25 MG TOTAL) BY MOUTH 3 (THREE) TIMES DAILY AS NEEDED FOR DIZZINESS. 60 tablet 1   metoprolol  succinate (TOPROL -XL) 50 MG 24 hr tablet Take 25 mg by mouth daily.     multivitamin (RENA-VIT) TABS tablet Take 1 tablet by mouth at bedtime. 30 tablet 0   omeprazole  (PRILOSEC) 20 MG capsule  Take 1 capsule (20 mg total) by mouth in the morning and at bedtime. 180 capsule 3   sevelamer  carbonate (RENVELA ) 800 MG tablet Take 800-1,600 mg by mouth See admin instructions. Take 2 tablets (1600 mg) by mouth three times daily with meals and take 1 tablet (800 mg) by mouth twice daily with snacks     torsemide  (DEMADEX ) 20 MG tablet Take 20 mg by mouth daily. Every day (including on dialysis days)     No current facility-administered medications on file prior to visit.    BP 132/84 (BP Location: Right Arm, Patient Position: Sitting, Cuff Size: Large)   Pulse 79   Temp 98.2 F (36.8 C)   Ht 5' 3 (1.6 m)   Wt 208 lb (94.3 kg)   SpO2 94%   BMI 36.85 kg/m   Objective:      Physical Exam GENERAL: Alert, cooperative, well developed, no acute distress. HEAD: Normocephalic atraumatic. EYES: Extraocular movements intact BL, pupils round, equal and reactive to light BL, conjunctivae normal BL. EXTREMITIES: No cyanosis or edema. NEUROLOGICAL: Oriented to person, place and time, moves all extremities without gross motor or sensory deficit.        Assessment & Plan:   Assessment & Plan Anxiety History of anxiety attacks, worsened several years ago after she lost her child and husband within months of eachother.  Managed with lorazepam  as needed, extensively counseled about risks of benzodiazepines, agreeable to try PRN hydroxyzine . She is already on daily claritin  for allergies, will mitigate anti-cholinergic effect risk by keeping dose low and use only occasional, pt counseled about risks/side effects.  - Discontinued lorazepam . - Prescribed hydroxyzine  25 mg tablets, instructed to take half a tablet as needed every 8 hours for anxiety. If unresponsive, can consider Buspar vs SSRI.  Vertigo Improvement with current meclizine  regimen, sx well controlled. - Prescribed meclizine  25 mg tablets, to be taken as needed every 8 hours.  Allergic rhinitis Sx well controlled with  Claritin . Reports running out of medication. - Prescribed Claritin  10mg  daily.   Return in about 2 weeks (around 05/12/2024) for CPE and anxiety .   Zuleica Seith K Jentry Mcqueary, MD  04/28/24     Contains text generated by Abridge.

## 2024-04-28 NOTE — Patient Instructions (Signed)
 Thank you for visiting [facility] today! Here's what we talked about: - START atarax  for anxiety. Watch for dizziness and drowsiness

## 2024-05-20 ENCOUNTER — Ambulatory Visit: Admitting: Student in an Organized Health Care Education/Training Program

## 2024-05-20 ENCOUNTER — Encounter: Payer: Self-pay | Admitting: Student in an Organized Health Care Education/Training Program

## 2024-05-20 ENCOUNTER — Ambulatory Visit

## 2024-05-20 VITALS — BP 156/80 | HR 81 | Temp 97.6°F | Ht 63.0 in | Wt 211.4 lb

## 2024-05-20 DIAGNOSIS — R053 Chronic cough: Secondary | ICD-10-CM

## 2024-05-20 DIAGNOSIS — E669 Obesity, unspecified: Secondary | ICD-10-CM | POA: Diagnosis not present

## 2024-05-20 DIAGNOSIS — Z6837 Body mass index (BMI) 37.0-37.9, adult: Secondary | ICD-10-CM | POA: Diagnosis not present

## 2024-05-20 DIAGNOSIS — I5032 Chronic diastolic (congestive) heart failure: Secondary | ICD-10-CM | POA: Diagnosis not present

## 2024-05-20 DIAGNOSIS — N186 End stage renal disease: Secondary | ICD-10-CM

## 2024-05-20 DIAGNOSIS — Z992 Dependence on renal dialysis: Secondary | ICD-10-CM | POA: Diagnosis not present

## 2024-05-20 DIAGNOSIS — R058 Other specified cough: Secondary | ICD-10-CM

## 2024-05-20 LAB — PULMONARY FUNCTION TEST
DL/VA % pred: 88 %
DL/VA: 3.69 ml/min/mmHg/L
DLCO unc % pred: 52 %
DLCO unc: 9.5 ml/min/mmHg
FEF 25-75 Post: 1.26 L/s
FEF 25-75 Pre: 0.82 L/s
FEF2575-%Change-Post: 53 %
FEF2575-%Pred-Post: 78 %
FEF2575-%Pred-Pre: 51 %
FEV1-%Change-Post: 9 %
FEV1-%Pred-Post: 54 %
FEV1-%Pred-Pre: 49 %
FEV1-Post: 1.09 L
FEV1-Pre: 0.99 L
FEV1FVC-%Change-Post: 4 %
FEV1FVC-%Pred-Pre: 102 %
FEV6-%Change-Post: 4 %
FEV6-%Pred-Post: 52 %
FEV6-%Pred-Pre: 50 %
FEV6-Post: 1.35 L
FEV6-Pre: 1.29 L
FEV6FVC-%Pred-Post: 105 %
FEV6FVC-%Pred-Pre: 105 %
FVC-%Change-Post: 4 %
FVC-%Pred-Post: 50 %
FVC-%Pred-Pre: 48 %
FVC-Post: 1.35 L
FVC-Pre: 1.29 L
Post FEV1/FVC ratio: 81 %
Post FEV6/FVC ratio: 100 %
Pre FEV1/FVC ratio: 77 %
Pre FEV6/FVC Ratio: 100 %
RV % pred: 78 %
RV: 1.76 L
TLC % pred: 62 %
TLC: 3.08 L

## 2024-05-20 NOTE — Patient Instructions (Signed)
°  VISIT SUMMARY: Today, we discussed your recurring cough, which had previously resolved but has returned in the last few days. We reviewed your recent tests, including a swallowing study and pulmonary function tests, and discussed your ongoing hemodialysis and weight management efforts.  YOUR PLAN: -CHRONIC COUGH: Your cough is likely due to post-nasal drip, and your recent tests do not indicate asthma or reflux. Continue taking loratadine  10 mg daily, and you can purchase it over-the-counter if your prescription runs out. You do not need an inhaler at this time.  -OBESITY: Your central obesity can affect your lung function and is related to your heart failure and dialysis. It is important to manage your weight to improve your respiratory function. Regular exercise and dietary modifications, such as avoiding processed foods, added sugars, and soft drinks, are recommended.  INSTRUCTIONS: Please continue with your current medications and follow the dietary and exercise recommendations. If your cough persists or worsens, or if you have any other concerns, please schedule a follow-up appointment.   Contains text generated by Abridge.

## 2024-05-20 NOTE — Patient Instructions (Signed)
 Full PFT completed today ? ?

## 2024-05-20 NOTE — Progress Notes (Unsigned)
 Assessment & Plan:   Assessment & Plan #Chronic cough - improved #UACS #HFpEF #ESRD on HD  Intermittent cough likely due to post-nasal drip. This has improved significantly with the use of second generation anti-histamines, suggesting upper airway cough syndrome.   PFTs show mixed obstructive and restrictive pattern, with no response to bronchodilator challenge. She has no history of smoking to suggest COPD, nor any history of asthma. I suspect the mixed picture is secondary to obesity as well as HFpEF with ESRD.  Further workup included a double contrast esophagogram (no reflux), a CT scan of the chest  (mosaic attenuation reflecting contrast administration or pulmonary edema, but no ILD), and TTE (LVEF 55%, grade 1 diastolic dysfunction, normal RV size and function, dilated LA, unchanged pericardial effusion, no significant valvulopathy aside from mild MR).  - Continue loratadine  10 mg orally daily. - Advised to purchase loratadine  over-the-counter if prescription runs out. - No need for inhaler.  #Obesity  Central obesity decreases lung volumes and impairs oxygen extraction, related to heart failure and dialysis. Weight management crucial for respiratory function improvement.  - Encouraged regular exercise. - Advised dietary modifications: avoid processed foods, added sugars, and soft drinks.   Return if symptoms worsen or fail to improve.  Belva November, MD  Pulmonary Critical Care  I spent 31 minutes caring for this patient today, including preparing to see the patient, obtaining a medical history , reviewing a separately obtained history, performing a medically appropriate examination and/or evaluation, counseling and educating the patient/family/caregiver, referring and communicating with other health care professionals (not separately reported), documenting clinical information in the electronic health record, and independently interpreting results (not separately  reported/billed) and communicating results to the patient/family/caregiver  End of visit medications:  No orders of the defined types were placed in this encounter.   Current Medications[1]   Subjective:   PATIENT ID: Carrie [name] GENDER: female DOB: 1950/11/19, MRN: [id number]  Chief Complaint  Patient presents with   Medical Management of Chronic Issues    Occasional cough and wheezing.     HPI  Discussed the use of AI scribe software for clinical note transcription with the patient, who gave verbal consent to proceed.  History of Present Illness Carrie [name] is a 73 year old female who presents for follow-up of cough.  Initial Visit 02/26/2024:  Patient reports that her cough symptoms started around a couple months ago.  It started with a cough that was productive of greenish sputum but has since persisted. She continues to cough and the cough is now dry.  The cough is worse at night and when she is sleeping and is better during the day.  The cough does not fully resolve during the day, however.  She describes it as a violent outburst of coughing that sometimes wakes her up from sleep and causes her eyes to tear.  This is very disturbing to the patient's routine.  She has not had any fevers, chills, night sweats, or weight loss.  She has not had any chest pain or chest tightness.  She denies any wheezing.  She has not had similar symptoms in the past.   Reviewed the patient's daily routine.  She wakes up at 3:30 and goes to have her dialysis sessions at 5:30 in the morning.  She is done with dialysis around 9 AM and goes back home.  She has breakfast and then goes back to sleep around 11 AM and wakes up around 4 PM.  She has  dinner around 5 PM.  She sometimes has a snack around 8 PM.  She goes back to sleep around 11 PM.  She does enjoy herself some Sprite but denies drinking coffee or other acidic beverages.   Patient has been seen by her primary care provider and multiple  interventions have been attempted.  She had a CT scan of the chest with PE protocol that ruled out pulmonary embolism last week.  The CT scan noted normal pulmonary parenchyma and no endobronchial or parenchymal lesions or findings to explain the cough.  Return Visit 05/20/2024:  Her cough had resolved after the last visit but has recurred in the last few days. She resumed taking Claritin  every morning after dialysis, which she finds helpful for her cough. She had previously run out of Claritin  but obtained more about two weeks ago.  A swallowing study showed no signs of reflux. A prior CT scan of the chest did not show any signs of interstitial lung disease. Pulmonary function tests conducted today revealed a mixed obstructive and restrictive pattern. She did not notice any improvement with albuterol , as there was no difference in her symptoms or test results after its use.  She continues on hemodialysis and reports that it is going well, although her blood pressure has been increasing. She experienced wheezing the other day, which resolved on its own. She is mindful of her fluid intake, limiting herself to 32 ounces per day, and is actively managing her weight, which she notes is improving.  She has a history of brain surgery, which left a non-healing area behind her ear that occasionally requires a Band-Aid to prevent irritation.  Past medical history of notable for ESRD on HD, HFpEF (admitted twice for pulmonary edema), and HTN.  Last TTE showed normal RV/LV function but with mild LVH and with a small pericardial effusion. BNP from 02/18/2024 was elevated at 238.    Patient denies any history of smoking.  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 She denies any occupational exposures.   Ancillary information including prior medications, full medical/surgical/family/social histories, and PFTs (when available) are listed below and have been  reviewed.    Review of Systems  Constitutional:  Negative for chills, fever, malaise/fatigue and weight loss.  Respiratory:  Positive for cough. Negative for hemoptysis, sputum production, shortness of breath and wheezing.   Cardiovascular:  Negative for chest pain.     Objective:   Vitals:   05/20/24 1447  BP: (!) 156/80  Pulse: 81  Temp: 97.6 F (36.4 C)  TempSrc: Temporal  SpO2: 93%  Weight: 211 lb 6.4 oz (95.9 kg)  Height: 5' 3 (1.6 m)   93% on RA BMI Readings from Last 3 Encounters:  05/20/24 37.45 kg/m  05/20/24 37.45 kg/m  04/28/24 36.85 kg/m   Wt Readings from Last 3 Encounters:  05/20/24 211 lb 6.4 oz (95.9 kg)  05/20/24 211 lb 6.4 oz (95.9 kg)  04/28/24 208 lb (94.3 kg)    Physical Exam Constitutional:      Appearance: Normal appearance.  Cardiovascular:     Rate and Rhythm: Normal rate and regular rhythm.     Pulses: Normal pulses.     Heart sounds: Normal heart sounds.  Pulmonary:     Effort: Pulmonary effort is normal.     Breath sounds: Normal breath sounds. No wheezing or rales.  Abdominal:     Palpations: Abdomen is soft.  Neurological:     General:  No focal deficit present.     Mental Status: She is alert and oriented to person, place, and time. Mental status is at baseline.       Ancillary Information    Past Medical History:  Diagnosis Date   Allergy    Anemia    Anxiety    Arthritis    right knee (10/26'/2017)   Chronic heart failure with preserved ejection fraction (HFpEF) (HCC)    a. 04/2021 Echo: EF >55%, GrI DD, nl RV fxn, triv MR; b. 12/2021 Echo: EF 60-65%, no rwma.   Demand ischemia (HCC)    a. 04/2021 HsTrop up to 73 in setting of volume overload; b. 10/2021 MV: EF 66% with small, mild reversible defect in the apical septal and mid anteroseptal myocardium which was felt to most likely represent artifact.   Depression    ESRD (end stage renal disease) (HCC)    a. OnHD (Dr. Woodward Brought)   GERD (gastroesophageal  reflux disease)    History of blood transfusion 2008   when I had brain tumor surgery   History of MRSA infection 2008   History of stress test    a. 07/2018 MV: EF 66%, no ischemia/infarct.   Hypertension    Meningioma (HCC)    Foramen magnum   Oxygen deficiency    Pericardial effusion    a. 12/2021 Echo: EF 60-65%, no rwma, mild LVH, nl RV fxn, small pericardial effusion - small pocket of post wall, ~ 1cm.   Precordial chest pain    a. 10/2021 MV: low risk.   Type II diabetes mellitus (HCC) 2011   on metformin  until yr ago- no med now - off due to kidneys     Family History  Problem Relation Age of Onset   Hypertension Other        Strong family history    Breast cancer Mother 99   Heart failure Sister    Heart disease Sister      Past Surgical History:  Procedure Laterality Date   A/V FISTULAGRAM Left 03/10/2019   Procedure: A/V FISTULAGRAM;  Surgeon: Jama Cordella MATSU, MD;  Location: [facility];  Service: Cardiovascular;  Laterality: Left;   A/V FISTULAGRAM Left 07/14/2019   Procedure: A/V FISTULAGRAM;  Surgeon: Jama Cordella MATSU, MD;  Location: [facility];  Service: Cardiovascular;  Laterality: Left;   A/V FISTULAGRAM Left 01/07/2020   Procedure: A/V FISTULAGRAM;  Surgeon: Jama Cordella MATSU, MD;  Location: [facility];  Service: Cardiovascular;  Laterality: Left;   A/V FISTULAGRAM Left 11/14/2021   Procedure: A/V Fistulagram;  Surgeon: Jama Cordella MATSU, MD;  Location: [facility];  Service: Cardiovascular;  Laterality: Left;   A/V SHUNTOGRAM Left 10/17/2023   Procedure: A/V SHUNTOGRAM;  Surgeon: Marea Selinda RAMAN, MD;  Location: [facility];  Service: Cardiovascular;  Laterality: Left;   AV FISTULA PLACEMENT Left 11/07/2018   Procedure: ARTERIOVENOUS (AV) FISTULA CREATION ( BRACHIO BASILIC ) VS BRACHIAL AXILLARY GRAFT;  Surgeon: Jama Cordella MATSU, MD;  Location: [facility];  Service: Vascular;  Laterality: Left;   BRAIN  MENINGIOMA EXCISION  08/2006   Foramina magnum meningioma   CERVICAL DISCECTOMY  1996   Dr. Yves   CESAREAN SECTION  1981   COLONOSCOPY     COLONOSCOPY WITH PROPOFOL  N/A 03/07/2021   Procedure: COLONOSCOPY WITH PROPOFOL ;  Surgeon: Janalyn Keene NOVAK, MD;  Location: [facility];  Service: Endoscopy;  Laterality: N/A;   DIALYSIS/PERMA CATHETER INSERTION N/A 08/04/2018  Procedure: DIALYSIS/PERMA CATHETER INSERTION;  Surgeon: Marea Selinda RAMAN, MD;  Location: [facility];  Service: Cardiovascular;  Laterality: N/A;   DIALYSIS/PERMA CATHETER REMOVAL N/A 12/25/2018   Procedure: DIALYSIS/PERMA CATHETER REMOVAL;  Surgeon: Marea Selinda RAMAN, MD;  Location: [facility];  Service: Cardiovascular;  Laterality: N/A;   DILATION AND CURETTAGE OF UTERUS     JOINT REPLACEMENT     right knee left hip   MULTIPLE TOOTH EXTRACTIONS  01/30/2023   Right wrist x-ray  2007   Deg. at 1st MCP   TOTAL HIP ARTHROPLASTY Left 03/27/2016   Procedure: LEFT TOTAL HIP ARTHROPLASTY ANTERIOR APPROACH;  Surgeon: Lonni CINDERELLA Poli, MD;  Location: [facility];  Service: Orthopedics;  Laterality: Left;   TOTAL KNEE ARTHROPLASTY Right 04/30/2017   Procedure: RIGHT TOTAL KNEE ARTHROPLASTY;  Surgeon: Poli Lonni CINDERELLA, MD;  Location: [facility] OR;  Service: Orthopedics;  Laterality: Right;   TOTAL KNEE ARTHROPLASTY Left 03/13/2022   Procedure: LEFT TOTAL KNEE ARTHROPLASTY;  Surgeon: Poli Lonni CINDERELLA, MD;  Location: [facility] OR;  Service: Orthopedics;  Laterality: Left;   TUBAL LIGATION  1981    Social History   Socioeconomic History   Marital status: Widowed    Spouse name: Not on file   Number of children: 1   Years of education: Not on file   Highest education level: Some college, no degree  Occupational History   Occupation: Formerly Chief Operating Officer, 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      No living will   Requests niece, June Sorrel, as health care POA   Would accept resuscitation attempts   No tube feeds if cognitively aware   Social Drivers of Health   Tobacco Use: Low Risk (05/20/2024)   Patient History    Smoking Tobacco Use: Never    Smokeless Tobacco Use: Never    Passive Exposure: Past  Financial Resource Strain: Low Risk (04/24/2024)   Overall Financial Resource Strain (CARDIA)    Difficulty of Paying Living Expenses: Not very hard  Food Insecurity: Food Insecurity Present (04/24/2024)   Epic    Worried About Programme Researcher, Broadcasting/film/video in the Last Year: Never true    Ran Out of Food in the Last Year: Sometimes true  Transportation Needs: No Transportation Needs (04/24/2024)   Epic    Lack of Transportation (Medical): No    Lack of Transportation (Non-Medical): No  Physical Activity: Inactive (04/24/2024)   Exercise Vital Sign    Days of Exercise per Week: 0 days    Minutes of Exercise per Session: Not on file  Stress: No Stress Concern Present (04/24/2024)   [employer] of Occupational Health - Occupational Stress Questionnaire    Feeling of Stress: Not at all  Social Connections: Moderately Integrated (04/24/2024)   Social Connection and Isolation Panel    Frequency of Communication with Friends and Family: Twice a week    Frequency of Social Gatherings with Friends and Family: Once a week    Attends Religious Services: More than  4 times per year    Active Member of Clubs or Organizations: Yes    Attends Banker Meetings: Never    Marital Status: Widowed  Intimate Partner Violence: Not At Risk (03/26/2024)   Epic    Fear of Current or  Ex-Partner: No    Emotionally Abused: No    Physically Abused: No    Sexually Abused: No  Depression (PHQ2-9): Low Risk (04/28/2024)   Depression (PHQ2-9)    PHQ-2 Score: 0  Recent Concern: Depression (PHQ2-9) - Medium Risk (03/26/2024)   Depression (PHQ2-9)    PHQ-2 Score: 7  Alcohol Screen: Low Risk (01/02/2024)   Alcohol Screen    Last Alcohol Screening Score (AUDIT): 0  Housing: Unknown (04/24/2024)   Epic    Unable to Pay for Housing in the Last Year: No    Number of Times Moved in the Last Year: Not on file    Homeless in the Last Year: No  Utilities: Not At Risk (03/26/2024)   Epic    Threatened with loss of utilities: No  Health Literacy: Adequate Health Literacy (01/23/2024)   B1300 Health Literacy    Frequency of need for help with medical instructions: Never     Allergies[2]   CBC    Component Value Date/Time   WBC 5.6 02/18/2024 1344   RBC 3.75 (L) 02/18/2024 1344   HGB 11.7 (L) 02/18/2024 1344   HCT 35.3 (L) 02/18/2024 1344   PLT 279.0 02/18/2024 1344   MCV 94.2 02/18/2024 1344   MCH 30.4 12/31/2022 0751   MCHC 33.0 02/18/2024 1344   RDW 16.1 (H) 02/18/2024 1344   LYMPHSABS 1.7 02/18/2024 1344   MONOABS 0.7 02/18/2024 1344   EOSABS 0.1 02/18/2024 1344   BASOSABS 0.1 02/18/2024 1344    Pulmonary Functions Testing Results:    Latest Ref Rng & Units 05/20/2024    1:37 PM  PFT Results  FVC-Pre L 1.29   FVC-Predicted Pre % 48   FVC-Post L 1.35   FVC-Predicted Post % 50   Pre FEV1/FVC % % 77   Post FEV1/FCV % % 81   FEV1-Pre L 0.99   FEV1-Predicted Pre % 49   FEV1-Post L 1.09   DLCO uncorrected ml/min/mmHg 9.50   DLCO UNC% % 52   DLVA Predicted % 88   TLC L 3.08   TLC % Predicted % 62   RV % Predicted % 78     Outpatient Medications Prior to Visit  Medication Sig Dispense Refill   acetaminophen  (TYLENOL ) 500 MG tablet Take 1,000 mg by mouth every 6 (six) hours as needed for mild pain or moderate pain.     amLODipine  (NORVASC ) 10 MG tablet  Take 10 mg by mouth daily.     atorvastatin  (LIPITOR) 40 MG tablet TAKE 40 MG BY MOUTH ONCE DAILY 90 tablet 3   hydrALAZINE  (APRESOLINE ) 25 MG tablet TAKE 1 TABLET BY MOUTH THREE TIMES A DAY (Patient taking differently: Take 25 mg by mouth 3 (three) times daily. Patient reports reduced to BID by nephrologist) 270 tablet 1   hydrOXYzine  (ATARAX ) 25 MG tablet Take 0.5 tablets (12.5 mg total) by mouth every 8 (eight) hours as needed for anxiety. 30 tablet 0   levETIRAcetam  (KEPPRA ) 500 MG tablet Take 1 tablet (500 mg total) by mouth 2 (two) times daily. 180 tablet 1   lidocaine -prilocaine  (EMLA ) cream Apply 1 application topically every Monday, Wednesday, and Friday with hemodialysis.     loratadine  (CLARITIN ) 10 MG tablet Take  1 tablet (10 mg total) by mouth daily. 90 tablet 0   LORazepam  (ATIVAN ) 0.5 MG tablet Take 1 tablet (0.5 mg total) by mouth 2 (two) times daily as needed for anxiety. 60 tablet 0   meclizine  (ANTIVERT ) 12.5 MG tablet TAKE 1-2 TABLETS (12.5-25 MG TOTAL) BY MOUTH 3 (THREE) TIMES DAILY AS NEEDED FOR DIZZINESS. 60 tablet 1   meclizine  (ANTIVERT ) 25 MG tablet Take 1 tablet (25 mg total) by mouth every 8 (eight) hours as needed for dizziness. 270 tablet 0   metoprolol  succinate (TOPROL -XL) 50 MG 24 hr tablet Take 25 mg by mouth daily.     multivitamin (RENA-VIT) TABS tablet Take 1 tablet by mouth at bedtime. 30 tablet 0   omeprazole  (PRILOSEC) 20 MG capsule Take 1 capsule (20 mg total) by mouth in the morning and at bedtime. 180 capsule 3   sevelamer  carbonate (RENVELA ) 800 MG tablet Take 800-1,600 mg by mouth See admin instructions. Take 2 tablets (1600 mg) by mouth three times daily with meals and take 1 tablet (800 mg) by mouth twice daily with snacks     torsemide  (DEMADEX ) 20 MG tablet Take 20 mg by mouth daily. Every day (including on dialysis days)     No facility-administered medications prior to visit.      [1]  Current Outpatient Medications:    acetaminophen  (TYLENOL )  500 MG tablet, Take 1,000 mg by mouth every 6 (six) hours as needed for mild pain or moderate pain., Disp: , Rfl:    amLODipine  (NORVASC ) 10 MG tablet, Take 10 mg by mouth daily., Disp: , Rfl:    atorvastatin  (LIPITOR) 40 MG tablet, TAKE 40 MG BY MOUTH ONCE DAILY, Disp: 90 tablet, Rfl: 3   hydrALAZINE  (APRESOLINE ) 25 MG tablet, TAKE 1 TABLET BY MOUTH THREE TIMES A DAY (Patient taking differently: Take 25 mg by mouth 3 (three) times daily. Patient reports reduced to BID by nephrologist), Disp: 270 tablet, Rfl: 1   hydrOXYzine  (ATARAX ) 25 MG tablet, Take 0.5 tablets (12.5 mg total) by mouth every 8 (eight) hours as needed for anxiety., Disp: 30 tablet, Rfl: 0   levETIRAcetam  (KEPPRA ) 500 MG tablet, Take 1 tablet (500 mg total) by mouth 2 (two) times daily., Disp: 180 tablet, Rfl: 1   lidocaine -prilocaine  (EMLA ) cream, Apply 1 application topically every Monday, Wednesday, and Friday with hemodialysis., Disp: , Rfl:    loratadine  (CLARITIN ) 10 MG tablet, Take 1 tablet (10 mg total) by mouth daily., Disp: 90 tablet, Rfl: 0   LORazepam  (ATIVAN ) 0.5 MG tablet, Take 1 tablet (0.5 mg total) by mouth 2 (two) times daily as needed for anxiety., Disp: 60 tablet, Rfl: 0   meclizine  (ANTIVERT ) 12.5 MG tablet, TAKE 1-2 TABLETS (12.5-25 MG TOTAL) BY MOUTH 3 (THREE) TIMES DAILY AS NEEDED FOR DIZZINESS., Disp: 60 tablet, Rfl: 1   meclizine  (ANTIVERT ) 25 MG tablet, Take 1 tablet (25 mg total) by mouth every 8 (eight) hours as needed for dizziness., Disp: 270 tablet, Rfl: 0   metoprolol  succinate (TOPROL -XL) 50 MG 24 hr tablet, Take 25 mg by mouth daily., Disp: , Rfl:    multivitamin (RENA-VIT) TABS tablet, Take 1 tablet by mouth at bedtime., Disp: 30 tablet, Rfl: 0   omeprazole  (PRILOSEC) 20 MG capsule, Take 1 capsule (20 mg total) by mouth in the morning and at bedtime., Disp: 180 capsule, Rfl: 3   sevelamer  carbonate (RENVELA ) 800 MG tablet, Take 800-1,600 mg by mouth See admin instructions. Take 2 tablets (1600 mg)  by mouth three  times daily with meals and take 1 tablet (800 mg) by mouth twice daily with snacks, Disp: , Rfl:    torsemide  (DEMADEX ) 20 MG tablet, Take 20 mg by mouth daily. Every day (including on dialysis days), Disp: , Rfl:  [2]  Allergies Allergen Reactions   Naproxen Sodium Anaphylaxis and Swelling   Sulfamethoxazole-Trimethoprim Anaphylaxis and Swelling

## 2024-05-20 NOTE — Progress Notes (Signed)
 Full PFT completed today ? ?

## 2024-05-21 ENCOUNTER — Telehealth

## 2024-05-21 NOTE — Patient Instructions (Signed)
 Carrie [name] - I am sorry I was unable to reach you today for our scheduled appointment. I work with [name] POUR, MD and am calling to support your healthcare needs. Please contact me at [phone] at your earliest convenience. I look forward to speaking with you soon.   Thank you,  Nestora Duos, MSN, RN [facility]  [facility], [facility], [facility], [facility] RN Care Manager Direct Dial : [phone] Fax: [phone]

## 2024-05-21 NOTE — Patient Outreach (Signed)
 RNCM - patient returned missed call to reschedule after holidays. Denies any needs/concerns, rescheduled as requested.

## 2024-05-22 ENCOUNTER — Telehealth: Payer: Self-pay | Admitting: *Deleted

## 2024-05-22 ENCOUNTER — Other Ambulatory Visit (INDEPENDENT_AMBULATORY_CARE_PROVIDER_SITE_OTHER): Payer: Self-pay | Admitting: Vascular Surgery

## 2024-05-22 DIAGNOSIS — N186 End stage renal disease: Secondary | ICD-10-CM

## 2024-05-22 NOTE — Telephone Encounter (Signed)
 Iris, nurse with [employer] called, pt received a visit from one of their providers on 05/19/24 her BP siting was 190/92 pulse 64. Standing 192/100 pulse 60, the nurse tried to call pt to f/u with her regarding her BP and they were unable to reach her so she she wanted to make sure PCP was aware

## 2024-05-26 ENCOUNTER — Ambulatory Visit (INDEPENDENT_AMBULATORY_CARE_PROVIDER_SITE_OTHER): Admitting: Nurse Practitioner

## 2024-05-26 ENCOUNTER — Telehealth: Payer: Self-pay

## 2024-05-26 ENCOUNTER — Ambulatory Visit (INDEPENDENT_AMBULATORY_CARE_PROVIDER_SITE_OTHER)

## 2024-05-26 ENCOUNTER — Encounter (INDEPENDENT_AMBULATORY_CARE_PROVIDER_SITE_OTHER): Payer: Self-pay | Admitting: Nurse Practitioner

## 2024-05-26 VITALS — BP 178/77 | HR 66 | Resp 18 | Ht 62.0 in | Wt 210.0 lb

## 2024-05-26 DIAGNOSIS — N186 End stage renal disease: Secondary | ICD-10-CM

## 2024-05-26 DIAGNOSIS — R053 Chronic cough: Secondary | ICD-10-CM

## 2024-05-26 DIAGNOSIS — I10 Essential (primary) hypertension: Secondary | ICD-10-CM | POA: Diagnosis not present

## 2024-05-26 DIAGNOSIS — R058 Other specified cough: Secondary | ICD-10-CM

## 2024-05-26 DIAGNOSIS — E785 Hyperlipidemia, unspecified: Secondary | ICD-10-CM | POA: Diagnosis not present

## 2024-05-26 DIAGNOSIS — I5032 Chronic diastolic (congestive) heart failure: Secondary | ICD-10-CM

## 2024-05-26 NOTE — Telephone Encounter (Signed)
 Copied from CRM #[id number]. Topic: Clinical - Order For Equipment >> May 26, 2024 11:22 AM Benton KIDD wrote: Reason for CRM: [facility] nurse care manager calling from [place] wanted to make a request for patient for a light weight portable oxygen tanks because she has really heavy tanks that she cant carry . Patient uses [employer]  [phone]  Ext [phone]

## 2024-05-26 NOTE — Progress Notes (Signed)
 "  Subjective:    Patient ID: Carrie [name], female    DOB: 06-08-1950, 73 y.o.   MRN: [id number] Chief Complaint  Patient presents with   Follow-up    6 month follow up + HDA    HPI  Discussed the use of AI scribe software for clinical note transcription with the patient, who gave verbal consent to proceed.  History of Present Illness Carrie [name] is a 73 year old female with end-stage renal disease on hemodialysis via left brachial-axillary arteriovenous graft who presents for routine vascular access follow-up.  Hemodialysis is proceeding without major complications. She has not experienced prolonged bleeding at the access site. Intermittent bleeding occurs from the lower site but resolves within five minutes with pressure and tape. She denies excessive bleeding, bruit changes, or signs of access dysfunction.  She experiences mild pain at the access site during needle insertion, particularly in an area of repeated cannulation. She prefers two nurses skilled in cannulation and avoids a third nurse due to prior difficulty and need for intervention.  No episodes of access thrombosis or machine alarms have occurred, except for one minor incident that resolved without intervention. She recalls a recent episode of difficult cannulation by a new nurse, which required assistance. No recent clot extraction has been necessary, and staff inform her if any access issues arise.  She was scheduled for surgery last month due to concerns for elevated arterial pressure, but the procedure was canceled after ultrasound findings did not indicate a need for intervention. No recent imaging has required surgical management.  Blood pressure is currently elevated and is being monitored by dialysis staff. She denies symptoms of hypotension or access thrombosis during dialysis.  She has limited venous access for IV placement due to restricted usable veins and relies on a specific nurse for successful IV  access during hospitalizations. She prefers to have blood drawn during dialysis to minimize additional venipuncture.    Results Diagnostic Hemodialysis graft ultrasound (05/26/2024): Flow volume 1844 mL/min, increased from 1805 mL/min on 11/28/2023; proximal graft velocities mildly elevated, suggestive of possible stenosis; no evidence of critical narrowing or occlusion.   Review of Systems     Objective:   Physical Exam  Physical Exam    BP (!) 178/77 (BP Location: Right Arm, Patient Position: Sitting, Cuff Size: Normal)   Pulse 66   Resp 18   Ht 5' 2 (1.575 m)   Wt 210 lb (95.3 kg)   BMI 38.41 kg/m   Past Medical History:  Diagnosis Date   Allergy    Anemia    Anxiety    Arthritis    right knee (10/26'/2017)   Chronic heart failure with preserved ejection fraction (HFpEF) (HCC)    a. 04/2021 Echo: EF >55%, GrI DD, nl RV fxn, triv MR; b. 12/2021 Echo: EF 60-65%, no rwma.   Demand ischemia (HCC)    a. 04/2021 HsTrop up to 73 in setting of volume overload; b. 10/2021 MV: EF 66% with small, mild reversible defect in the apical septal and mid anteroseptal myocardium which was felt to most likely represent artifact.   Depression    ESRD (end stage renal disease) (HCC)    a. On[facility] (Dr. Woodward Brought)   GERD (gastroesophageal reflux disease)    History of blood transfusion 2008   when I had brain tumor surgery   History of MRSA infection 2008   History of stress test    a. 07/2018 MV: EF 66%, no ischemia/infarct.  Hypertension    Meningioma (HCC)    Foramen magnum   Oxygen deficiency    Pericardial effusion    a. 12/2021 Echo: EF 60-65%, no rwma, mild LVH, nl RV fxn, small pericardial effusion - small pocket of post wall, ~ 1cm.   Precordial chest pain    a. 10/2021 MV: low risk.   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      No living will   Requests niece, June Sorrel, as health care POA   Would accept resuscitation attempts   No tube feeds if cognitively aware   Social Drivers of Health   Tobacco Use: Low Risk (05/26/2024)   Patient History    Smoking Tobacco Use: Never    Smokeless Tobacco Use: Never    Passive Exposure: Past  Financial Resource Strain: Low Risk (04/24/2024)   Overall Financial Resource Strain (CARDIA)    Difficulty of Paying Living Expenses: Not very hard  Food Insecurity: Food Insecurity Present (04/24/2024)   Epic    Worried About Programme Researcher, Broadcasting/film/video in the Last Year: Never true    Ran Out of Food in the Last Year: Sometimes true  Transportation Needs: No Transportation Needs (04/24/2024)   Epic    Lack of Transportation (Medical): No    Lack of Transportation (Non-Medical): No  Physical Activity: Inactive (04/24/2024)   Exercise Vital Sign    Days of Exercise per Week: 0 days    Minutes of Exercise per Session: Not on file  Stress: No Stress Concern Present (04/24/2024)   [employer] of Occupational Health - Occupational Stress Questionnaire    Feeling of Stress: Not at all  Social Connections: Moderately Integrated (04/24/2024)   Social Connection and Isolation Panel    Frequency of Communication with Friends and Family: Twice a  week    Frequency of Social Gatherings with Friends and Family: Once a week    Attends Religious Services: More than 4 times per year    Active Member of [employer] or Organizations: Yes    Attends Banker Meetings: Never    Marital Status: Widowed  Intimate Partner Violence: Not At Risk (03/26/2024)   Epic    Fear of Current or Ex-Partner: No    Emotionally Abused: No    Physically Abused: No    Sexually Abused: No  Depression (PHQ2-9): Low Risk (04/28/2024)   Depression (PHQ2-9)    PHQ-2 Score: 0  Recent Concern: Depression (PHQ2-9) - Medium Risk (03/26/2024)   Depression (PHQ2-9)    PHQ-2 Score: 7  Alcohol Screen: Low Risk (01/02/2024)  Alcohol Screen    Last Alcohol Screening Score (AUDIT): 0  Housing: Unknown (04/24/2024)   Epic    Unable to Pay for Housing in the Last Year: No    Number of Times Moved in the Last Year: Not on file    Homeless in the Last Year: No  Utilities: Not At Risk (03/26/2024)   Epic    Threatened with loss of utilities: No  Health Literacy: Adequate Health Literacy (01/23/2024)   B1300 Health Literacy    Frequency of need for help with medical instructions: Never    Past Surgical History:  Procedure Laterality Date   A/V FISTULAGRAM Left 03/10/2019   Procedure: A/V FISTULAGRAM;  Surgeon: Jama Cordella MATSU, MD;  Location: [facility];  Service: Cardiovascular;  Laterality: Left;   A/V FISTULAGRAM Left 07/14/2019   Procedure: A/V FISTULAGRAM;  Surgeon: Jama Cordella MATSU, MD;  Location: [facility];  Service: Cardiovascular;  Laterality: Left;   A/V FISTULAGRAM Left 01/07/2020   Procedure: A/V FISTULAGRAM;  Surgeon: Jama Cordella MATSU, MD;  Location: [facility];  Service: Cardiovascular;  Laterality: Left;   A/V FISTULAGRAM Left 11/14/2021   Procedure: A/V Fistulagram;  Surgeon: Jama Cordella MATSU, MD;  Location: [facility];  Service: Cardiovascular;  Laterality: Left;   A/V SHUNTOGRAM Left 10/17/2023    Procedure: A/V SHUNTOGRAM;  Surgeon: Marea Selinda RAMAN, MD;  Location: [facility];  Service: Cardiovascular;  Laterality: Left;   AV FISTULA PLACEMENT Left 11/07/2018   Procedure: ARTERIOVENOUS (AV) FISTULA CREATION ( BRACHIO BASILIC ) VS BRACHIAL AXILLARY GRAFT;  Surgeon: Jama Cordella MATSU, MD;  Location: [facility];  Service: Vascular;  Laterality: Left;   BRAIN MENINGIOMA EXCISION  08/2006   Foramina magnum meningioma   CERVICAL DISCECTOMY  1996   Dr. Yves   CESAREAN SECTION  1981   COLONOSCOPY     COLONOSCOPY WITH PROPOFOL  N/A 03/07/2021   Procedure: COLONOSCOPY WITH PROPOFOL ;  Surgeon: Janalyn Keene NOVAK, MD;  Location: [facility];  Service: Endoscopy;  Laterality: N/A;   DIALYSIS/PERMA CATHETER INSERTION N/A 08/04/2018   Procedure: DIALYSIS/PERMA CATHETER INSERTION;  Surgeon: Marea Selinda RAMAN, MD;  Location: [facility];  Service: Cardiovascular;  Laterality: N/A;   DIALYSIS/PERMA CATHETER REMOVAL N/A 12/25/2018   Procedure: DIALYSIS/PERMA CATHETER REMOVAL;  Surgeon: Marea Selinda RAMAN, MD;  Location: [facility];  Service: Cardiovascular;  Laterality: N/A;   DILATION AND CURETTAGE OF UTERUS     JOINT REPLACEMENT     right knee left hip   MULTIPLE TOOTH EXTRACTIONS  01/30/2023   Right wrist x-ray  2007   Deg. at 1st MCP   TOTAL HIP ARTHROPLASTY Left 03/27/2016   Procedure: LEFT TOTAL HIP ARTHROPLASTY ANTERIOR APPROACH;  Surgeon: Lonni CINDERELLA Poli, MD;  Location: [facility];  Service: Orthopedics;  Laterality: Left;   TOTAL KNEE ARTHROPLASTY Right 04/30/2017   Procedure: RIGHT TOTAL KNEE ARTHROPLASTY;  Surgeon: Poli Lonni CINDERELLA, MD;  Location: [facility];  Service: Orthopedics;  Laterality: Right;   TOTAL KNEE ARTHROPLASTY Left 03/13/2022   Procedure: LEFT TOTAL KNEE ARTHROPLASTY;  Surgeon: Poli Lonni CINDERELLA, MD;  Location: [facility];  Service: Orthopedics;  Laterality: Left;   TUBAL LIGATION  1981    Family History  Problem Relation Age of Onset    Hypertension Other        Strong family history    Breast cancer Mother 26   Heart failure Sister    Heart disease Sister  Allergies[1]     Latest Ref Rng & Units 02/18/2024    1:44 PM 12/23/2023    8:50 AM 12/31/2022    7:51 AM  CBC  WBC 4.0 - 10.5 K/uL 5.6   4.7   Hemoglobin 12.0 - 15.0 g/dL 88.2  89.9  89.7   Hematocrit 36.0 - 46.0 % 35.3   31.8   Platelets 150.0 - 400.0 K/uL 279.0   158       CMP     Component Value Date/Time   NA 134 (L) 12/31/2022 0751   K 3.8 12/31/2022 0751   CL 96 (L) 12/31/2022 0751   CO2 26 12/31/2022 0751   GLUCOSE 99 12/31/2022 0751   BUN 33 (H) 12/31/2022 0751   BUN 36 (A) 07/08/2017 0000   CREATININE 7.51 (H) 12/31/2022 0751   CALCIUM  8.6 (L) 12/31/2022 0751   PROT 7.3 05/02/2021 0524   ALBUMIN  3.7 12/31/2022 0751   AST 14 (L) 05/02/2021 0524   ALT 12 05/02/2021 0524   ALKPHOS 95 05/02/2021 0524   BILITOT 0.7 05/02/2021 0524   GFR 16.03 (L) 12/20/2017 1145   GFRNONAA 5 (L) 12/31/2022 0751     No results found.     Assessment & Plan:   1. ESRD (end stage renal disease) (HCC) (Primary) End stage renal disease on hemodialysis Stable graft function with mildly elevated proximal graft velocities indicating mild stenosis without clinical dysfunction. Occasional minor bleeding and localized pain at cannulation site. No infection, persistent bleeding, or access failure. Elevated blood pressure not affecting access. No surgical intervention needed due to lack of critical stenosis or graft failure. - Instructed dialysis staff to avoid cannulating the painful area to promote healing. - Provided reassurance regarding mild increased velocity on ultrasound; no intervention indicated at this time. - Advised to report persistent bleeding, signs of infection (leakage, erythema, swelling), or access dysfunction (prolonged bleeding, frequent thrombosis, dialysis machine alarms). - Scheduled follow-up in six months or sooner if new issues arise. -  VAS US  DUPLEX DIALYSIS ACCESS (AVF, AVG); Future   2. Essential hypertension Continue antihypertensive medications as already ordered, these medications have been reviewed and there are no changes at this time.  3. Hyperlipidemia, unspecified hyperlipidemia type Continue statin as ordered and reviewed, no changes at this time    Medications Ordered Prior to Encounter[2]  There are no Patient Instructions on file for this visit. Return in about 6 months (around 11/24/2024) for with HDA JD/FB.   Dacie Mandel E Tristan Bramble, NP      [1]  Allergies Allergen Reactions   Naproxen Sodium Anaphylaxis and Swelling   Sulfamethoxazole-Trimethoprim Anaphylaxis and Swelling  [2]  Current Outpatient Medications on File Prior to Visit  Medication Sig Dispense Refill   acetaminophen  (TYLENOL ) 500 MG tablet Take 1,000 mg by mouth every 6 (six) hours as needed for mild pain or moderate pain.     amLODipine  (NORVASC ) 10 MG tablet Take 10 mg by mouth daily.     atorvastatin  (LIPITOR) 40 MG tablet TAKE 40 MG BY MOUTH ONCE DAILY 90 tablet 3   hydrALAZINE  (APRESOLINE ) 25 MG tablet TAKE 1 TABLET BY MOUTH THREE TIMES A DAY (Patient taking differently: Take 25 mg by mouth 4 (four) times daily. Patient reports she is taking 2 in the AM and 2 in the PM) 270 tablet 1   hydrOXYzine  (ATARAX ) 25 MG tablet Take 0.5 tablets (12.5 mg total) by mouth every 8 (eight) hours as needed for anxiety. 30 tablet 0   levETIRAcetam  (KEPPRA )  500 MG tablet Take 1 tablet (500 mg total) by mouth 2 (two) times daily. 180 tablet 1   lidocaine -prilocaine  (EMLA ) cream Apply 1 application topically every Monday, Wednesday, and Friday with hemodialysis.     loratadine  (CLARITIN ) 10 MG tablet Take 1 tablet (10 mg total) by mouth daily. 90 tablet 0   LORazepam  (ATIVAN ) 0.5 MG tablet Take 1 tablet (0.5 mg total) by mouth 2 (two) times daily as needed for anxiety. 60 tablet 0   meclizine  (ANTIVERT ) 12.5 MG tablet TAKE 1-2 TABLETS (12.5-25 MG TOTAL) BY  MOUTH 3 (THREE) TIMES DAILY AS NEEDED FOR DIZZINESS. 60 tablet 1   meclizine  (ANTIVERT ) 25 MG tablet Take 1 tablet (25 mg total) by mouth every 8 (eight) hours as needed for dizziness. 270 tablet 0   metoprolol  succinate (TOPROL -XL) 50 MG 24 hr tablet Take 25 mg by mouth daily.     multivitamin (RENA-VIT) TABS tablet Take 1 tablet by mouth at bedtime. 30 tablet 0   omeprazole  (PRILOSEC) 20 MG capsule Take 1 capsule (20 mg total) by mouth in the morning and at bedtime. 180 capsule 3   sevelamer  carbonate (RENVELA ) 800 MG tablet Take 800-1,600 mg by mouth See admin instructions. Take 2 tablets (1600 mg) by mouth three times daily with meals and take 1 tablet (800 mg) by mouth twice daily with snacks     torsemide  (DEMADEX ) 20 MG tablet Take 20 mg by mouth daily. Every day (including on dialysis days)     No current facility-administered medications on file prior to visit.   "

## 2024-05-26 NOTE — Telephone Encounter (Addendum)
Okay to place order for POC 

## 2024-05-26 NOTE — Telephone Encounter (Signed)
 Order for POC has been placed and I have notified the patient.  Nothing further needed.

## 2024-06-05 ENCOUNTER — Telehealth: Payer: Self-pay

## 2024-06-12 ENCOUNTER — Other Ambulatory Visit: Payer: Self-pay

## 2024-06-12 NOTE — Patient Instructions (Addendum)
 Visit Information  Thank you for taking time to visit with me today. Please don't hesitate to contact me if I can be of assistance to you before our next scheduled appointment.  Your next care management appointment is by telephone on 07/09/2024 at 10:00 am  Telephone follow-up in 1 month  Please call the care guide team at [phone] if you need to cancel, schedule, or reschedule an appointment.   Please call the Suicide and Crisis Lifeline: 988 call the [place]  National Suicide Prevention Lifeline: 870-051-9936 or TTY: 416-882-5744 TTY (816)526-9430) to talk to a trained counselor call 1-800-273-TALK (toll free, 24 hour hotline) go to [facility] [address] [place] [phone]) call 911 if you are experiencing a Mental Health or Behavioral Health Crisis or need someone to talk to.  Nestora Duos, MSN, RN [place]  [facility], [facility] RN Care Manager Direct Dial : [phone] Fax: [phone]  Heart Failure Action Plan A heart failure action plan helps you know what to do when you have symptoms of heart failure. Your action plan is a color-coded plan that lists the symptoms to watch for and indicates what actions to take. If you have symptoms in the green zone, you're doing well. If you have symptoms in the yellow zone, you're having problems. If you have symptoms in the red zone, you need medical care right away. Follow the plan that was created by you and your health care provider. Review your plan each time you visit your provider. Green zone These signs mean you're doing well and can continue what you're doing: You don't have new or worsening shortness of breath. You have very little swelling or no new swelling. Your weight is stable (no gain or loss). You have a normal activity level. You don't have chest pain or any other new symptoms. Yellow zone These signs and symptoms mean your condition may  be getting worse and you should make some changes: You have trouble breathing when you're active. You have swelling in your feet or legs or have discomfort in your belly. You gain 2-3 lb (0.9-1.4 kg) in 24 hours, or 5 lb (2.3 kg) in a week. This amount may be more or less depending on your condition. You get tired easily. You have trouble sleeping. You have a dry cough. If you have any of these symptoms: Contact your provider within the next day. Your provider may adjust your medicines. Red zone These signs and symptoms mean you should get medical help right away: You have trouble breathing when resting or cannot lie flat and you need to raise your head to help you breathe. You have a dry cough that's getting worse. You have swelling or pain in your feet or legs or discomfort in your belly that's getting worse. You suddenly gain more than 2-3 lb (0.9-1.4 kg) in 24 hours, or more than 5 lb (2.3 kg) in a week. This amount may be more or less depending on your condition. You have trouble staying awake or you feel confused. You don't have an appetite. You have worsening sadness or depression. These symptoms may be an emergency. Call 911 right away. Do not wait to see if the symptoms will go away. Do not drive yourself to the hospital. Follow these instructions at home: Take medicines only as told. Eat a heart-healthy diet. Work with a dietitian to create an eating plan that's best for you. Weigh yourself each day. Your target weight is __________ lb (__________ kg).  Call your provider if you gain more than __________ lb (__________ kg) in 24 hours, or more than __________ lb (__________ kg) in a week. Health care provider name: _____________________________________________________ Health care provider phone number: _____________________________________________________ Where to find more information American Heart Association: heart.org This information is not intended to replace advice  given to you by your health care provider. Make sure you discuss any questions you have with your health care provider. Document Revised: 01/03/2023 Document Reviewed: 01/03/2023 Elsevier Patient Education  2024 Elsevier Inc.   Managing Your Hypertension Hypertension, also called high blood pressure, is when the force of the blood pressing against the walls of the arteries is too strong. Arteries are blood vessels that carry blood from your heart throughout your body. Hypertension forces the heart to work harder to pump blood and may cause the arteries to become narrow or stiff. Understanding blood pressure readings A blood pressure reading includes a higher number over a lower number: The first, or top, number is called the systolic pressure. It is a measure of the pressure in your arteries as your heart beats. The second, or bottom number, is called the diastolic pressure. It is a measure of the pressure in your arteries as the heart relaxes. For most people, a normal blood pressure is below 120/80. Your personal target blood pressure may vary depending on your medical conditions, your age, and other factors. Blood pressure is classified into four stages. Based on your blood pressure reading, your health care provider may use the following stages to determine what type of treatment you need, if any. Systolic pressure and diastolic pressure are measured in a unit called millimeters of mercury (mmHg). Normal Systolic pressure: below 120. Diastolic pressure: below 80. Elevated Systolic pressure: 120-129. Diastolic pressure: below 80. Hypertension stage 1 Systolic pressure: 130-139. Diastolic pressure: 80-89. Hypertension stage 2 Systolic pressure: 140 or above. Diastolic pressure: 90 or above. How can this condition affect me? Managing your hypertension is very important. Over time, hypertension can damage the arteries and decrease blood flow to parts of the body, including the brain,  heart, and kidneys. Having untreated or uncontrolled hypertension can lead to: A heart attack. A stroke. A weakened blood vessel (aneurysm). Heart failure. Kidney damage. Eye damage. Memory and concentration problems. Vascular dementia. What actions can I take to manage this condition? Hypertension can be managed by making lifestyle changes and possibly by taking medicines. Your health care provider will help you make a plan to bring your blood pressure within a normal range. You may be referred for counseling on a healthy diet and physical activity. Nutrition  Eat a diet that is high in fiber and potassium, and low in salt (sodium), added sugar, and fat. An example eating plan is called the DASH diet. DASH stands for Dietary Approaches to Stop Hypertension. To eat this way: Eat plenty of fresh fruits and vegetables. Try to fill one-half of your plate at each meal with fruits and vegetables. Eat whole grains, such as whole-wheat pasta, brown rice, or whole-grain bread. Fill about one-fourth of your plate with whole grains. Eat low-fat dairy products. Avoid fatty cuts of meat, processed or cured meats, and poultry with skin. Fill about one-fourth of your plate with lean proteins such as fish, chicken without skin, beans, eggs, and tofu. Avoid pre-made and processed foods. These tend to be higher in sodium, added sugar, and fat. Reduce your daily sodium intake. Many people with hypertension should eat less than 1,500 mg of sodium a day.  Lifestyle  Work with your health care provider to maintain a healthy body weight or to lose weight. Ask what an ideal weight is for you. Get at least 30 minutes of exercise that causes your heart to beat faster (aerobic exercise) most days of the week. Activities may include walking, swimming, or biking. Include exercise to strengthen your muscles (resistance exercise), such as weight lifting, as part of your weekly exercise routine. Try to do these types of  exercises for 30 minutes at least 3 days a week. Do not use any products that contain nicotine or tobacco. These products include cigarettes, chewing tobacco, and vaping devices, such as e-cigarettes. If you need help quitting, ask your health care provider. Control any long-term (chronic) conditions you have, such as high cholesterol or diabetes. Identify your sources of stress and find ways to manage stress. This may include meditation, deep breathing, or making time for fun activities. Alcohol use Do not drink alcohol if: Your health care provider tells you not to drink. You are pregnant, may be pregnant, or are planning to become pregnant. If you drink alcohol: Limit how much you have to: 0-1 drink a day for women. 0-2 drinks a day for men. Know how much alcohol is in your drink. In the [place] one drink equals one 12 oz bottle of beer (355 mL), one 5 oz glass of wine (148 mL), or one 1 oz glass of hard liquor (44 mL). Medicines Your health care provider may prescribe medicine if lifestyle changes are not enough to get your blood pressure under control and if: Your systolic blood pressure is 130 or higher. Your diastolic blood pressure is 80 or higher. Take medicines only as told by your health care provider. Follow the directions carefully. Blood pressure medicines must be taken as told by your health care provider. The medicine does not work as well when you skip doses. Skipping doses also puts you at risk for problems. Monitoring Before you monitor your blood pressure: Do not smoke, drink caffeinated beverages, or exercise within 30 minutes before taking a measurement. Use the bathroom and empty your bladder (urinate). Sit quietly for at least 5 minutes before taking measurements. Monitor your blood pressure at home as told by your health care provider. To do this: Sit with your back straight and supported. Place your feet flat on the floor. Do not cross your legs. Support your arm on  a flat surface, such as a table. Make sure your upper arm is at heart level. Each time you measure, take two or three readings one minute apart and record the results. You may also need to have your blood pressure checked regularly by your health care provider. General information Talk with your health care provider about your diet, exercise habits, and other lifestyle factors that may be contributing to hypertension. Review all the medicines you take with your health care provider because there may be side effects or interactions. Keep all follow-up visits. Your health care provider can help you create and adjust your plan for managing your high blood pressure. Where to find more information National Heart, Lung, and Blood Institute: popsteam.is American Heart Association: www.heart.org Contact a health care provider if: You think you are having a reaction to medicines you have taken. You have repeated (recurrent) headaches. You feel dizzy. You have swelling in your ankles. You have trouble with your vision. Get help right away if: You develop a severe headache or confusion. You have unusual weakness or numbness, or you  feel faint. You have severe pain in your chest or abdomen. You vomit repeatedly. You have trouble breathing. These symptoms may be an emergency. Get help right away. Call 911. Do not wait to see if the symptoms will go away. Do not drive yourself to the hospital. Summary Hypertension is when the force of blood pumping through your arteries is too strong. If this condition is not controlled, it may put you at risk for serious complications. Your personal target blood pressure may vary depending on your medical conditions, your age, and other factors. For most people, a normal blood pressure is less than 120/80. Hypertension is managed by lifestyle changes, medicines, or both. Lifestyle changes to help manage hypertension include losing weight, eating a healthy,  low-sodium diet, exercising more, stopping smoking, and limiting alcohol. This information is not intended to replace advice given to you by your health care provider. Make sure you discuss any questions you have with your health care provider. Document Revised: 02/02/2021 Document Reviewed: 02/02/2021 Elsevier Patient Education  2024 [employer].

## 2024-06-12 NOTE — Patient Outreach (Signed)
 Complex Care Management   Visit Note  06/12/2024  Name:  Carrie [name] MRN: [id number] DOB: 1951-03-16  Situation: Referral received for Complex Care Management related to Heart Failure and HTN I obtained verbal consent from Patient.  Visit completed with Patient  on the phone  Background:   Past Medical History:  Diagnosis Date   Allergy    Anemia    Anxiety    Arthritis    right knee (10/26'/2017)   Chronic heart failure with preserved ejection fraction (HFpEF) (HCC)    a. 04/2021 Echo: EF >55%, GrI DD, nl RV fxn, triv MR; b. 12/2021 Echo: EF 60-65%, no rwma.   Demand ischemia (HCC)    a. 04/2021 HsTrop up to 73 in setting of volume overload; b. 10/2021 MV: EF 66% with small, mild reversible defect in the apical septal and mid anteroseptal myocardium which was felt to most likely represent artifact.   Depression    ESRD (end stage renal disease) (HCC)    a. OnHD (Dr. Woodward Brought)   GERD (gastroesophageal reflux disease)    History of blood transfusion 2008   when I had brain tumor surgery   History of MRSA infection 2008   History of stress test    a. 07/2018 MV: EF 66%, no ischemia/infarct.   Hypertension    Meningioma (HCC)    Foramen magnum   Oxygen deficiency    Pericardial effusion    a. 12/2021 Echo: EF 60-65%, no rwma, mild LVH, nl RV fxn, small pericardial effusion - small pocket of post wall, ~ 1cm.   Precordial chest pain    a. 10/2021 MV: low risk.   Type II diabetes mellitus (HCC) 2011   on metformin  until yr ago- no med now - off due to kidneys    Assessment: Patient Reported Symptoms:  Cognitive Cognitive Status: No symptoms reported Cognitive/Intellectual Conditions Management [RPT]: None reported or documented in medical history or problem list   Health Maintenance Behaviors: Annual physical exam  Neurological Neurological Review of Symptoms: Dizziness Neurological Comment: meclazine prn for vertigo, no seizure activity  HEENT HEENT Symptoms  Reported: No symptoms reported      Cardiovascular Cardiovascular Symptoms Reported: Other: Other Cardiovascular Symptoms: elevated BP off and on, esports told was high today at dialysis, reports earlier this week she was feeling bad at dialysis, eyes blurry, stated told her they took too much fluid off and sister had to come and get her, Cardiovascular Management Strategies: Medication therapy, Routine screening  Respiratory Respiratory Symptoms Reported: Shortness of breath, Wheezing Other Respiratory Symptoms: reports wearing O2 2L has pulse ox and stated at times in 70's 80's then goes up to 90's, reports in 90's today, discussed deep breathing exercises, use of pulse ox and  red flags for ED, starting prednisone  today for wheezing, states told Dr Lavinia will tke 1 tablet daily instead of loading dose with taper as originally ordered because makes her retain too much fluid, Respiratory Management Strategies: Oxygen therapy, Medication therapy, Routine screening  Endocrine Endocrine Symptoms Reported: Not assessed Is patient diabetic?: No    Gastrointestinal Gastrointestinal Symptoms Reported: No symptoms reported Gastrointestinal Management Strategies: Diet modification, Fluid modification Gastrointestinal Comment: 32 oz fluid restrict    Genitourinary Genitourinary Symptoms Reported: No symptoms reported Genitourinary Management Strategies: Hemodialysis Genitourinary Comment: Vascualr 05/26/24 no issues with graft  Integumentary Integumentary Symptoms Reported: No symptoms reported    Musculoskeletal Additional Musculoskeletal Details: denies pain or unsteady gait, no cane/walker no falls Musculoskeletal Management Strategies: Routine  screening Falls in the past year?: Yes Number of falls in past year: 2 or more Was there an injury with Fall?: No Fall Risk Category Calculator: 2 Patient Fall Risk Level: Moderate Fall Risk Patient at Risk for Falls Due to: History of fall(s) Fall  risk Follow up: Falls evaluation completed, Falls prevention discussed  Psychosocial Psychosocial Symptoms Reported: Sadness - if selected complete PHQ 2-9 Additional Psychological Details: several losses over last month and other friends in hospital, this is month daughter passed away, multiple things with health and just feeling sad reports support from sisters and niece aware LCSW avaialbe as needed - declining for now, reports tries to stay upbeat Behavioral Management Strategies: Support system, Coping strategies Major Change/Loss/Stressor/Fears (CP): Death of a loved one, Medical condition, self Techniques to Cope with Loss/Stress/Change: Diversional activities, Spiritual practice(s)      06/12/2024    PHQ2-9 Depression Screening   Little interest or pleasure in doing things Not at all  Feeling down, depressed, or hopeless More than half the days (feeling down/sad)  PHQ-2 - Total Score 2  Trouble falling or staying asleep, or sleeping too much    Feeling tired or having little energy    Poor appetite or overeating     Feeling bad about yourself - or that you are a failure or have let yourself or your family down    Trouble concentrating on things, such as reading the newspaper or watching television    Moving or speaking so slowly that other people could have noticed.  Or the opposite - being so fidgety or restless that you have been moving around a lot more than usual    Thoughts that you would be better off dead, or hurting yourself in some way    PHQ2-9 Total Score    If you checked off any problems, how difficult have these problems made it for you to do your work, take care of things at home, or get along with other people    Depression Interventions/Treatment      There were no vitals filed for this visit.    Medications Reviewed Today     Reviewed by Devra Lands, RN (Registered Nurse) on 06/12/24 at 1108  Med List Status: <None>   Medication Order Taking? Sig  Documenting Provider Last Dose Status Informant  acetaminophen  (TYLENOL ) 500 MG tablet [id number] Yes Take 1,000 mg by mouth every 6 (six) hours as needed for mild pain or moderate pain. [provider]  Active Self  amLODipine  (NORVASC ) 10 MG tablet [id number] Yes Take 10 mg by mouth daily. [provider]  Active Self  atorvastatin  (LIPITOR) 40 MG tablet [id number] Yes TAKE 40 MG BY MOUTH ONCE DAILY Jimmy Charlie FERNS, MD  Active Self  hydrALAZINE  (APRESOLINE ) 25 MG tablet [id number] Yes TAKE 1 TABLET BY MOUTH THREE TIMES A DAY  Patient taking differently: Take 25 mg by mouth 4 (four) times daily. Patient reports she is taking 2 in the AM and 2 in the PM   Jimmy Charlie I, MD  Active   levETIRAcetam  (KEPPRA ) 500 MG tablet [id number] Yes Take 1 tablet (500 mg total) by mouth 2 (two) times daily. Bowa, Nahosi K, MD  Active   lidocaine -prilocaine  (EMLA ) cream [id number] Yes Apply 1 application topically every Monday, Wednesday, and Friday with hemodialysis. [provider]  Active Self  loratadine  (CLARITIN ) 10 MG tablet [id number] Yes Take 1 tablet (10 mg total) by mouth daily. Bowa, Nahosi K, MD  Active   LORazepam  (  ATIVAN ) 0.5 MG tablet [id number] Yes Take 1 tablet (0.5 mg total) by mouth 2 (two) times daily as needed for anxiety. Webb, Padonda B, FNP  Active   meclizine  (ANTIVERT ) 12.5 MG tablet [id number] Yes TAKE 1-2 TABLETS (12.5-25 MG TOTAL) BY MOUTH 3 (THREE) TIMES DAILY AS NEEDED FOR DIZZINESS. Letvak, Richard I, MD  Active   meclizine  (ANTIVERT ) 25 MG tablet [id number]  Take 1 tablet (25 mg total) by mouth every 8 (eight) hours as needed for dizziness. Bowa, Nahosi K, MD  Active   metoprolol  succinate (TOPROL -XL) 50 MG 24 hr tablet [id number] Yes Take 25 mg by mouth daily. [provider]  Active Self  multivitamin (RENA-VIT) TABS tablet [id number] Yes Take 1 tablet by mouth at bedtime. Josette Ade, MD  Active Self  omeprazole  (PRILOSEC) 20 MG capsule [id number]  Yes Take 1 capsule (20 mg total) by mouth in the morning and at bedtime. Letvak, Richard I, MD  Active   sevelamer  carbonate (RENVELA ) 800 MG tablet [id number] Yes Take 800-1,600 mg by mouth See admin instructions. Take 2 tablets (1600 mg) by mouth three times daily with meals and take 1 tablet (800 mg) by mouth twice daily with snacks [provider]  Active Self  torsemide  (DEMADEX ) 20 MG tablet [id number] Yes Take 20 mg by mouth daily. Every day (including on dialysis days) [provider]  Active Self            Recommendation:   PCP Follow-up Continue Current Plan of Care  Follow Up Plan:   Telephone follow-up in 1 month  Nestora Duos, MSN, RN [facility]  [facility], [facility] RN Care Manager Direct Dial : [phone] Fax: [phone]

## 2024-06-16 ENCOUNTER — Ambulatory Visit: Admitting: Student in an Organized Health Care Education/Training Program

## 2024-06-16 ENCOUNTER — Encounter: Payer: Self-pay | Admitting: Student in an Organized Health Care Education/Training Program

## 2024-06-16 VITALS — BP 138/82 | HR 77 | Temp 98.4°F | Ht 62.0 in | Wt 212.6 lb

## 2024-06-16 DIAGNOSIS — J4540 Moderate persistent asthma, uncomplicated: Secondary | ICD-10-CM | POA: Diagnosis not present

## 2024-06-16 DIAGNOSIS — Z6838 Body mass index (BMI) 38.0-38.9, adult: Secondary | ICD-10-CM | POA: Diagnosis not present

## 2024-06-16 DIAGNOSIS — R053 Chronic cough: Secondary | ICD-10-CM

## 2024-06-16 DIAGNOSIS — I5032 Chronic diastolic (congestive) heart failure: Secondary | ICD-10-CM | POA: Diagnosis not present

## 2024-06-16 DIAGNOSIS — E669 Obesity, unspecified: Secondary | ICD-10-CM | POA: Diagnosis not present

## 2024-06-16 MED ORDER — FLUTICASONE-SALMETEROL 100-50 MCG/ACT IN AEPB: 1.0000 | INHALATION_SPRAY | Freq: Two times a day (BID) | RESPIRATORY_TRACT | 12 refills | Status: AC

## 2024-06-16 NOTE — Patient Instructions (Signed)
" °  VISIT SUMMARY: During your visit, we discussed your recent wheezing and coughing, which have improved with prednisone . We also addressed your concerns about carrying oxygen and your fluctuating oxygen levels during activity. Additionally, we reviewed your chronic cough, asthma, and the impact of obesity on your health.  YOUR PLAN: -MODERATE PERSISTENT ASTHMA: Asthma is a condition where your airways narrow and swell, producing extra mucus, which can make breathing difficult. We have prescribed a Wixela inhaler to use twice daily. Please remember to wash your mouth after each use to prevent thrush. We have also ordered a portable oxygen concentrator to help manage your oxygen levels.  -CHRONIC HEART FAILURE WITH PRESERVED EJECTION FRACTION: This condition means your heart's lower chambers are stiff and cannot relax properly, which affects blood flow.   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 Please continue taking loratadine  10 mg daily to help manage this symptom. Also use the inhaler that we prescribed you.  -OBESITY: Excess body weight can contribute to respiratory and cardiovascular issues. We encourage you to engage in regular exercise to help with weight loss and improve your overall health.  INSTRUCTIONS: Please follow the prescribed treatment plan for your asthma, including using the Wixela inhaler and washing your mouth after each use. Continue your exercise regimen to manage your heart condition and help with weight loss. Take loratadine  10 mg daily for your chronic cough. Use the portable oxygen concentrator as needed to manage your oxygen levels. Follow up with your nephrologist and primary care physician as scheduled. I will see you back in 3 months.                      Contains text generated by Abridge.                                 Contains text generated by Abridge.    "

## 2024-06-16 NOTE — Progress Notes (Unsigned)
 " Assessment & Plan  #Moderate persistent asthma #Chronic Cough  She experienced recent wheezing and coughing episodes, which improved with prednisone . PFTs show mixed obstructive and restrictive pattern, with no response to bronchodilator challenge. She has no history of smoking to suggest COPD, nor any personal history of asthma. There is however a family history of asthma, and given symptoms, will empirically treat with ICS/LABA.   Will prescribe Wixela inhaler, one puff in the morning and one in the evening. Instruct her to wash her mouth after each use to prevent oral thrush. Furthermore, patient previously felt her cough improved significantly with anti-histamine therapy and we will continue loratadine  for UACS. As to her volume status and possible pulmonary edema, her weight is being closely managed by nephrology with regular HD sessions.  - fluticasone -salmeterol (WIXELA INHUB) 100-50 MCG/ACT AEPB; Inhale 1 puff into the lungs 2 (two) times daily.  Dispense: 60 each; Refill: 12  #Chronic heart failure with preserved ejection fraction  She has chronic heart failure with preserved ejection fraction and recent episodes of hypoxemia. Her exercise tolerance is improving with chair exercises. Further workup included a double contrast esophagogram (no reflux), a CT scan of the chest  (mosaic attenuation reflecting contrast administration or pulmonary edema, but no ILD), and TTE (LVEF 55%, grade 1 diastolic dysfunction, normal RV size and function, dilated LA, unchanged pericardial effusion, no significant valvulopathy aside from mild MR).  -Encourage regular exercise to improve cardiovascular health and blood pressure control. -continue HD  #Obesity    Obesity is contributing to her respiratory and cardiovascular issues. Exercise is recommended to aid in weight management and improve overall health. Encourage regular exercise to aid in weight loss and improve overall health.  Return in about 3  months (around 09/14/2024).  Belva November, MD [facility]  I spent 30 minutes caring for this patient today, including preparing to see the patient, obtaining a medical history , reviewing a separately obtained history, performing a medically appropriate examination and/or evaluation, counseling and educating the patient/family/caregiver, ordering medications, tests, or procedures, documenting clinical information in the electronic health record, and independently interpreting results (not separately reported/billed) and communicating results to the patient/family/caregiver  End of visit medications:  Meds ordered this encounter  Medications   fluticasone -salmeterol (WIXELA INHUB) 100-50 MCG/ACT AEPB    Sig: Inhale 1 puff into the lungs 2 (two) times daily.    Dispense:  60 each    Refill:  12    Current Medications[1]   Subjective:   PATIENT ID: Carrie [name] GENDER: female DOB: 10/24/1950, MRN: [id number]  Chief Complaint  Patient presents with   Follow-up    SOB. Wheezing. Cough, dry. Claritin - daily, helps with the cough.     HPI  Discussed the use of AI scribe software for clinical note transcription with the patient, who gave verbal consent to proceed.  History of Present Illness  Carrie [name] is a 74 year old female who presents with wheezing and cough. She was referred by her nephrologist for re-evaluation due to wheezing and coughing.  Initial Visit 02/26/2024:   Patient reports that her cough symptoms started around a couple months ago.  It started with a cough that was productive of greenish sputum but has since persisted. She continues to cough and the cough is now dry.  The cough is worse at night and when she is sleeping and is better during the day.  The cough does not fully resolve during the day, however.  She  describes it as a violent outburst of coughing that sometimes wakes her up from sleep and causes her eyes to tear.  This is  very disturbing to the patient's routine.  She has not had any fevers, chills, night sweats, or weight loss.  She has not had any chest pain or chest tightness.  She denies any wheezing.  She has not had similar symptoms in the past.   Reviewed the patient's daily routine.  She wakes up at 3:30 and goes to have her dialysis sessions at 5:30 in the morning.  She is done with dialysis around 9 AM and goes back home.  She has breakfast and then goes back to sleep around 11 AM and wakes up around 4 PM.  She has dinner around 5 PM.  She sometimes has a snack around 8 PM.  She goes back to sleep around 11 PM.  She does enjoy herself some Sprite but denies drinking coffee or other acidic beverages.   Patient has been seen by her primary care provider and multiple interventions have been attempted.  She had a CT scan of the chest with PE protocol that ruled out pulmonary embolism last week.  The CT scan noted normal pulmonary parenchyma and no endobronchial or parenchymal lesions or findings to explain the cough.   Return Visit 05/20/2024:   Her cough had resolved after the last visit but has recurred in the last few days. She resumed taking Claritin  every morning after dialysis, which she finds helpful for her cough. She had previously run out of Claritin  but obtained more about two weeks ago.   A swallowing study showed no signs of reflux. A prior CT scan of the chest did not show any signs of interstitial lung disease. Pulmonary function tests conducted today revealed a mixed obstructive and restrictive pattern. She did not notice any improvement with albuterol , as there was no difference in her symptoms or test results after its use.   She continues on hemodialysis and reports that it is going well, although her blood pressure has been increasing. She experienced wheezing the other day, which resolved on its own. She is mindful of her fluid intake, limiting herself to 32 ounces per day, and is actively  managing her weight, which she notes is improving.   She has a history of brain surgery, which left a non-healing area behind her ear that occasionally requires a Band-Aid to prevent irritation.   Past medical history of notable for ESRD on HD, HFpEF (admitted twice for pulmonary edema), and HTN.  Last TTE showed normal RV/LV function but with mild LVH and with a small pericardial effusion. BNP from 02/18/2024 was elevated at 238.   Return Visit 06/16/2024:  She has a history of cough, previously evaluated in September and December 2025, with pulmonary function tests showing a mixed obstructive and restrictive pattern. There was no improvement with albuterol .  Recently, she was noted to be wheezing by her nephrologist during a visit. She hears herself wheeze occasionally, but generally does not notice it. However, staff at her dialysis center have heard it. She is concerned about the need to carry oxygen, as she uses it at home but finds it cumbersome to transport. She was prescribed prednisone  by her nephrologist, but she is cautious about weight gain and does not follow the prescribed dosing schedule strictly. She has felt better with the prednisone , with improvement in her dyspnea and wheezing.  She has started chair exercises with her family and manages well  without becoming winded. She has a family history of asthma, with her mother and sister both affected.  She is currently using prednisone , taking one in the morning and one at night, but is concerned about the side effects, particularly increased appetite.   Patient denies any history of smoking.  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 She denies any occupational exposures.   Ancillary information including prior medications, full medical/surgical/family/social histories, and PFTs (when available) are listed below and have been reviewed.    Review of Systems  Constitutional:   Negative for chills, fever and weight loss.  Respiratory:  Positive for cough, shortness of breath and wheezing. Negative for hemoptysis and sputum production.   Cardiovascular:  Negative for chest pain.     Objective:   Vitals:   06/16/24 1458  BP: 138/82  Pulse: 77  Temp: 98.4 F (36.9 C)  SpO2: 94%  Weight: 212 lb 9.6 oz (96.4 kg)  Height: 5' 2 (1.575 m)   94% on RA  BMI Readings from Last 3 Encounters:  06/16/24 38.89 kg/m  05/26/24 38.41 kg/m  05/20/24 37.45 kg/m   Wt Readings from Last 3 Encounters:  06/16/24 212 lb 9.6 oz (96.4 kg)  05/26/24 210 lb (95.3 kg)  05/20/24 211 lb 6.4 oz (95.9 kg)    Physical Exam Constitutional:      Appearance: Normal appearance. She is obese.  Cardiovascular:     Rate and Rhythm: Normal rate and regular rhythm.     Pulses: Normal pulses.     Heart sounds: No murmur heard. Pulmonary:     Effort: Pulmonary effort is normal.     Breath sounds: Normal breath sounds. No wheezing or rales.  Neurological:     General: No focal deficit present.     Mental Status: She is alert and oriented to person, place, and time. Mental status is at baseline.       Ancillary Information    Past Medical History:  Diagnosis Date   Allergy    Anemia    Anxiety    Arthritis    right knee (10/26'/2017)   Chronic heart failure with preserved ejection fraction (HFpEF) (HCC)    a. 04/2021 Echo: EF >55%, GrI DD, nl RV fxn, triv MR; b. 12/2021 Echo: EF 60-65%, no rwma.   Demand ischemia (HCC)    a. 04/2021 HsTrop up to 73 in setting of volume overload; b. 10/2021 MV: EF 66% with small, mild reversible defect in the apical septal and mid anteroseptal myocardium which was felt to most likely represent artifact.   Depression    ESRD (end stage renal disease) (HCC)    a. OnHD (Dr. Woodward Brought)   GERD (gastroesophageal reflux disease)    History of blood transfusion 2008   when I had brain tumor surgery   History of MRSA infection 2008    History of stress test    a. 07/2018 MV: EF 66%, no ischemia/infarct.   Hypertension    Meningioma (HCC)    Foramen magnum   Oxygen deficiency    Pericardial effusion    a. 12/2021 Echo: EF 60-65%, no rwma, mild LVH, nl RV fxn, small pericardial effusion - small pocket of post wall, ~ 1cm.   Precordial chest pain    a. 10/2021 MV: low risk.   Type II diabetes mellitus (HCC) 2011   on metformin  until yr ago- no med now - off due to kidneys  Family History  Problem Relation Age of Onset   Hypertension Other        Strong family history    Breast cancer Mother 59   Heart failure Sister    Heart disease Sister      Past Surgical History:  Procedure Laterality Date   A/V FISTULAGRAM Left 03/10/2019   Procedure: A/V FISTULAGRAM;  Surgeon: Jama Cordella MATSU, MD;  Location: [facility];  Service: Cardiovascular;  Laterality: Left;   A/V FISTULAGRAM Left 07/14/2019   Procedure: A/V FISTULAGRAM;  Surgeon: Jama Cordella MATSU, MD;  Location: [facility];  Service: Cardiovascular;  Laterality: Left;   A/V FISTULAGRAM Left 01/07/2020   Procedure: A/V FISTULAGRAM;  Surgeon: Jama Cordella MATSU, MD;  Location: [facility];  Service: Cardiovascular;  Laterality: Left;   A/V FISTULAGRAM Left 11/14/2021   Procedure: A/V Fistulagram;  Surgeon: Jama Cordella MATSU, MD;  Location: [facility];  Service: Cardiovascular;  Laterality: Left;   A/V SHUNTOGRAM Left 10/17/2023   Procedure: A/V SHUNTOGRAM;  Surgeon: Marea Selinda RAMAN, MD;  Location: [facility];  Service: Cardiovascular;  Laterality: Left;   AV FISTULA PLACEMENT Left 11/07/2018   Procedure: ARTERIOVENOUS (AV) FISTULA CREATION ( BRACHIO BASILIC ) VS BRACHIAL AXILLARY GRAFT;  Surgeon: Jama Cordella MATSU, MD;  Location: [facility];  Service: Vascular;  Laterality: Left;   BRAIN MENINGIOMA EXCISION  08/2006   Foramina magnum meningioma   CERVICAL DISCECTOMY  1996   Dr. Yves   CESAREAN SECTION  1981    COLONOSCOPY     COLONOSCOPY WITH PROPOFOL  N/A 03/07/2021   Procedure: COLONOSCOPY WITH PROPOFOL ;  Surgeon: Janalyn Keene NOVAK, MD;  Location: [facility];  Service: Endoscopy;  Laterality: N/A;   DIALYSIS/PERMA CATHETER INSERTION N/A 08/04/2018   Procedure: DIALYSIS/PERMA CATHETER INSERTION;  Surgeon: Marea Selinda RAMAN, MD;  Location: [facility];  Service: Cardiovascular;  Laterality: N/A;   DIALYSIS/PERMA CATHETER REMOVAL N/A 12/25/2018   Procedure: DIALYSIS/PERMA CATHETER REMOVAL;  Surgeon: Marea Selinda RAMAN, MD;  Location: [facility];  Service: Cardiovascular;  Laterality: N/A;   DILATION AND CURETTAGE OF UTERUS     JOINT REPLACEMENT     right knee left hip   MULTIPLE TOOTH EXTRACTIONS  01/30/2023   Right wrist x-ray  2007   Deg. at 1st MCP   TOTAL HIP ARTHROPLASTY Left 03/27/2016   Procedure: LEFT TOTAL HIP ARTHROPLASTY ANTERIOR APPROACH;  Surgeon: Lonni CINDERELLA Poli, MD;  Location: [facility];  Service: Orthopedics;  Laterality: Left;   TOTAL KNEE ARTHROPLASTY Right 04/30/2017   Procedure: RIGHT TOTAL KNEE ARTHROPLASTY;  Surgeon: Poli Lonni CINDERELLA, MD;  Location: [facility];  Service: Orthopedics;  Laterality: Right;   TOTAL KNEE ARTHROPLASTY Left 03/13/2022   Procedure: LEFT TOTAL KNEE ARTHROPLASTY;  Surgeon: Poli Lonni CINDERELLA, MD;  Location: [facility];  Service: Orthopedics;  Laterality: Left;   TUBAL LIGATION  1981    Social History   Socioeconomic History   Marital status: Widowed    Spouse name: Not on file   Number of children: 1   Years of education: Not on file   Highest education level: Some college, no degree  Occupational History   Occupation: Formerly Chief Operating Officer, 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      No living will   Requests niece, June Sorrel, as health care POA   Would accept resuscitation attempts   No tube feeds if cognitively aware   Social Drivers of Health   Tobacco Use: Low Risk (06/16/2024)   Patient History    Smoking Tobacco Use: Never    Smokeless Tobacco Use: Never    Passive Exposure: Past  Financial Resource Strain: Low Risk (04/24/2024)   Overall Financial Resource Strain (CARDIA)    Difficulty of Paying Living Expenses: Not very hard  Food Insecurity: No Food Insecurity (06/12/2024)   Epic    Worried About Programme Researcher, Broadcasting/film/video in the Last Year: Never true    Ran Out of Food in the Last Year: Never true  Recent Concern: Food Insecurity - Food Insecurity Present (04/24/2024)   Epic    Worried About Programme Researcher, Broadcasting/film/video in the Last Year: Never true    Ran Out of Food in the Last Year: Sometimes true  Transportation Needs: No Transportation Needs (04/24/2024)   Epic    Lack of Transportation (Medical): No    Lack of Transportation (Non-Medical): No  Physical Activity: Inactive (04/24/2024)   Exercise Vital Sign    Days of Exercise per Week: 0 days    Minutes of Exercise per Session: Not on file  Stress: No Stress Concern Present (04/24/2024)   [employer] of Occupational Health - Occupational Stress Questionnaire    Feeling of Stress: Not at all  Social Connections: Moderately Integrated (04/24/2024)   Social Connection and Isolation Panel    Frequency of Communication with Friends and Family: Twice a week    Frequency of Social Gatherings with Friends and Family: Once a week    Attends Religious Services: More than 4 times per year    Active Member of [employer] or Organizations: Yes    Attends Banker Meetings: Never    Marital Status: Widowed  Intimate Partner Violence:  Not At Risk (03/26/2024)   Epic    Fear of Current or Ex-Partner: No    Emotionally Abused: No    Physically Abused: No    Sexually Abused: No  Depression (PHQ2-9): Low Risk (06/12/2024)   Depression (PHQ2-9)    PHQ-2 Score: 2  Recent Concern: Depression (PHQ2-9) - Medium Risk (03/26/2024)   Depression (PHQ2-9)    PHQ-2 Score: 7  Alcohol Screen: Low Risk (01/02/2024)   Alcohol Screen    Last Alcohol Screening Score (AUDIT): 0  Housing: Unknown (04/24/2024)   Epic    Unable to Pay for Housing in the Last Year: No    Number of Times Moved in the Last Year: Not on file    Homeless in the Last Year: No  Utilities: Not At Risk (03/26/2024)   Epic    Threatened with loss of utilities: No  Health Literacy: Adequate Health Literacy (01/23/2024)   B1300 Health Literacy    Frequency of need for help with medical instructions: Never     Allergies[2]   CBC    Component Value Date/Time   WBC 5.6 02/18/2024 1344   RBC 3.75 (L) 02/18/2024 1344   HGB 11.7 (L) 02/18/2024 1344   HCT 35.3 (L) 02/18/2024 1344   PLT 279.0 02/18/2024 1344  MCV 94.2 02/18/2024 1344   MCH 30.4 12/31/2022 0751   MCHC 33.0 02/18/2024 1344   RDW 16.1 (H) 02/18/2024 1344   LYMPHSABS 1.7 02/18/2024 1344   MONOABS 0.7 02/18/2024 1344   EOSABS 0.1 02/18/2024 1344   BASOSABS 0.1 02/18/2024 1344    Pulmonary Functions Testing Results:    Latest Ref Rng & Units 05/20/2024    1:37 PM  PFT Results  FVC-Pre L 1.29   FVC-Predicted Pre % 48   FVC-Post L 1.35   FVC-Predicted Post % 50   Pre FEV1/FVC % % 77   Post FEV1/FCV % % 81   FEV1-Pre L 0.99   FEV1-Predicted Pre % 49   FEV1-Post L 1.09   DLCO uncorrected ml/min/mmHg 9.50   DLCO UNC% % 52   DLVA Predicted % 88   TLC L 3.08   TLC % Predicted % 62   RV % Predicted % 78     Outpatient Medications Prior to Visit  Medication Sig Dispense Refill   acetaminophen  (TYLENOL ) 500 MG tablet Take 1,000 mg by mouth every 6 (six) hours as needed for mild pain or  moderate pain.     amLODipine  (NORVASC ) 10 MG tablet Take 10 mg by mouth daily.     atorvastatin  (LIPITOR) 40 MG tablet TAKE 40 MG BY MOUTH ONCE DAILY 90 tablet 3   hydrALAZINE  (APRESOLINE ) 25 MG tablet TAKE 1 TABLET BY MOUTH THREE TIMES A DAY (Patient taking differently: Take 25 mg by mouth 4 (four) times daily. Patient reports she is taking 2 in the AM and 2 in the PM) 270 tablet 1   levETIRAcetam  (KEPPRA ) 500 MG tablet Take 1 tablet (500 mg total) by mouth 2 (two) times daily. 180 tablet 1   lidocaine -prilocaine  (EMLA ) cream Apply 1 application topically every Monday, Wednesday, and Friday with hemodialysis.     loratadine  (CLARITIN ) 10 MG tablet Take 1 tablet (10 mg total) by mouth daily. 90 tablet 0   LORazepam  (ATIVAN ) 0.5 MG tablet Take 1 tablet (0.5 mg total) by mouth 2 (two) times daily as needed for anxiety. 60 tablet 0   meclizine  (ANTIVERT ) 12.5 MG tablet TAKE 1-2 TABLETS (12.5-25 MG TOTAL) BY MOUTH 3 (THREE) TIMES DAILY AS NEEDED FOR DIZZINESS. 60 tablet 1   meclizine  (ANTIVERT ) 25 MG tablet Take 1 tablet (25 mg total) by mouth every 8 (eight) hours as needed for dizziness. 270 tablet 0   metoprolol  succinate (TOPROL -XL) 25 MG 24 hr tablet Take 25 mg by mouth daily.     multivitamin (RENA-VIT) TABS tablet Take 1 tablet by mouth at bedtime. 30 tablet 0   omeprazole  (PRILOSEC) 20 MG capsule Take 1 capsule (20 mg total) by mouth in the morning and at bedtime. 180 capsule 3   predniSONE  (DELTASONE ) 10 MG tablet Take 10 mg by mouth daily with breakfast. (Patient taking differently: Take 10 mg by mouth in the morning and at bedtime.)     sevelamer  carbonate (RENVELA ) 800 MG tablet Take 800-1,600 mg by mouth See admin instructions. Take 2 tablets (1600 mg) by mouth three times daily with meals and take 1 tablet (800 mg) by mouth twice daily with snacks     torsemide  (DEMADEX ) 20 MG tablet Take 20 mg by mouth daily. Every day (including on dialysis days)     metoprolol  succinate (TOPROL -XL) 50  MG 24 hr tablet Take 25 mg by mouth daily.     No facility-administered medications prior to visit.      [1]  Current Outpatient Medications:    acetaminophen  (TYLENOL ) 500 MG tablet, Take 1,000 mg by mouth every 6 (six) hours as needed for mild pain or moderate pain., Disp: , Rfl:    amLODipine  (NORVASC ) 10 MG tablet, Take 10 mg by mouth daily., Disp: , Rfl:    atorvastatin  (LIPITOR) 40 MG tablet, TAKE 40 MG BY MOUTH ONCE DAILY, Disp: 90 tablet, Rfl: 3   fluticasone -salmeterol (WIXELA INHUB) 100-50 MCG/ACT AEPB, Inhale 1 puff into the lungs 2 (two) times daily., Disp: 60 each, Rfl: 12   hydrALAZINE  (APRESOLINE ) 25 MG tablet, TAKE 1 TABLET BY MOUTH THREE TIMES A DAY (Patient taking differently: Take 25 mg by mouth 4 (four) times daily. Patient reports she is taking 2 in the AM and 2 in the PM), Disp: 270 tablet, Rfl: 1   levETIRAcetam  (KEPPRA ) 500 MG tablet, Take 1 tablet (500 mg total) by mouth 2 (two) times daily., Disp: 180 tablet, Rfl: 1   lidocaine -prilocaine  (EMLA ) cream, Apply 1 application topically every Monday, Wednesday, and Friday with hemodialysis., Disp: , Rfl:    loratadine  (CLARITIN ) 10 MG tablet, Take 1 tablet (10 mg total) by mouth daily., Disp: 90 tablet, Rfl: 0   LORazepam  (ATIVAN ) 0.5 MG tablet, Take 1 tablet (0.5 mg total) by mouth 2 (two) times daily as needed for anxiety., Disp: 60 tablet, Rfl: 0   meclizine  (ANTIVERT ) 12.5 MG tablet, TAKE 1-2 TABLETS (12.5-25 MG TOTAL) BY MOUTH 3 (THREE) TIMES DAILY AS NEEDED FOR DIZZINESS., Disp: 60 tablet, Rfl: 1   meclizine  (ANTIVERT ) 25 MG tablet, Take 1 tablet (25 mg total) by mouth every 8 (eight) hours as needed for dizziness., Disp: 270 tablet, Rfl: 0   metoprolol  succinate (TOPROL -XL) 25 MG 24 hr tablet, Take 25 mg by mouth daily., Disp: , Rfl:    multivitamin (RENA-VIT) TABS tablet, Take 1 tablet by mouth at bedtime., Disp: 30 tablet, Rfl: 0   omeprazole  (PRILOSEC) 20 MG capsule, Take 1 capsule (20 mg total) by mouth in the  morning and at bedtime., Disp: 180 capsule, Rfl: 3   predniSONE  (DELTASONE ) 10 MG tablet, Take 10 mg by mouth daily with breakfast. (Patient taking differently: Take 10 mg by mouth in the morning and at bedtime.), Disp: , Rfl:    sevelamer  carbonate (RENVELA ) 800 MG tablet, Take 800-1,600 mg by mouth See admin instructions. Take 2 tablets (1600 mg) by mouth three times daily with meals and take 1 tablet (800 mg) by mouth twice daily with snacks, Disp: , Rfl:    torsemide  (DEMADEX ) 20 MG tablet, Take 20 mg by mouth daily. Every day (including on dialysis days), Disp: , Rfl:  [2]  Allergies Allergen Reactions   Naproxen Sodium Anaphylaxis and Swelling   Sulfamethoxazole-Trimethoprim Anaphylaxis and Swelling   "

## 2024-06-20 ENCOUNTER — Other Ambulatory Visit: Payer: Self-pay

## 2024-06-20 DIAGNOSIS — F419 Anxiety disorder, unspecified: Secondary | ICD-10-CM

## 2024-06-23 ENCOUNTER — Emergency Department

## 2024-06-23 ENCOUNTER — Ambulatory Visit: Payer: Self-pay

## 2024-06-23 ENCOUNTER — Encounter: Payer: Self-pay | Admitting: Family Medicine

## 2024-06-23 ENCOUNTER — Other Ambulatory Visit: Payer: Self-pay | Admitting: Family Medicine

## 2024-06-23 ENCOUNTER — Ambulatory Visit: Admitting: Family Medicine

## 2024-06-23 ENCOUNTER — Encounter: Payer: Self-pay | Admitting: *Deleted

## 2024-06-23 ENCOUNTER — Emergency Department
Admission: EM | Admit: 2024-06-23 | Discharge: 2024-06-24 | Disposition: A | Attending: Emergency Medicine | Admitting: Emergency Medicine

## 2024-06-23 VITALS — BP 148/68 | HR 76 | Temp 98.5°F | Ht 62.0 in | Wt 219.0 lb

## 2024-06-23 DIAGNOSIS — E875 Hyperkalemia: Secondary | ICD-10-CM | POA: Diagnosis not present

## 2024-06-23 DIAGNOSIS — U071 COVID-19: Secondary | ICD-10-CM | POA: Diagnosis not present

## 2024-06-23 DIAGNOSIS — I509 Heart failure, unspecified: Secondary | ICD-10-CM | POA: Insufficient documentation

## 2024-06-23 DIAGNOSIS — J45909 Unspecified asthma, uncomplicated: Secondary | ICD-10-CM | POA: Insufficient documentation

## 2024-06-23 DIAGNOSIS — R0602 Shortness of breath: Secondary | ICD-10-CM | POA: Diagnosis present

## 2024-06-23 DIAGNOSIS — N186 End stage renal disease: Secondary | ICD-10-CM | POA: Insufficient documentation

## 2024-06-23 DIAGNOSIS — R051 Acute cough: Secondary | ICD-10-CM

## 2024-06-23 DIAGNOSIS — Z992 Dependence on renal dialysis: Secondary | ICD-10-CM | POA: Insufficient documentation

## 2024-06-23 DIAGNOSIS — R0981 Nasal congestion: Secondary | ICD-10-CM

## 2024-06-23 LAB — CBC
HCT: 28.1 % — ABNORMAL LOW (ref 36.0–46.0)
Hemoglobin: 9.1 g/dL — ABNORMAL LOW (ref 12.0–15.0)
MCH: 31.2 pg (ref 26.0–34.0)
MCHC: 32.4 g/dL (ref 30.0–36.0)
MCV: 96.2 fL (ref 80.0–100.0)
Platelets: 132 K/uL — ABNORMAL LOW (ref 150–400)
RBC: 2.92 MIL/uL — ABNORMAL LOW (ref 3.87–5.11)
RDW: 16.4 % — ABNORMAL HIGH (ref 11.5–15.5)
WBC: 6.4 K/uL (ref 4.0–10.5)
nRBC: 0 % (ref 0.0–0.2)

## 2024-06-23 LAB — BASIC METABOLIC PANEL WITH GFR
Anion gap: 15 (ref 5–15)
BUN: 89 mg/dL — ABNORMAL HIGH (ref 8–23)
CO2: 25 mmol/L (ref 22–32)
Calcium: 9.4 mg/dL (ref 8.9–10.3)
Chloride: 100 mmol/L (ref 98–111)
Creatinine, Ser: 12.4 mg/dL — ABNORMAL HIGH (ref 0.44–1.00)
GFR, Estimated: 3 mL/min — ABNORMAL LOW
Glucose, Bld: 117 mg/dL — ABNORMAL HIGH (ref 70–99)
Potassium: 5.3 mmol/L — ABNORMAL HIGH (ref 3.5–5.1)
Sodium: 139 mmol/L (ref 135–145)

## 2024-06-23 LAB — TROPONIN T, HIGH SENSITIVITY: Troponin T High Sensitivity: 39 ng/L — ABNORMAL HIGH (ref 0–19)

## 2024-06-23 LAB — POC COVID19 BINAXNOW: SARS Coronavirus 2 Ag: POSITIVE — AB

## 2024-06-23 MED ORDER — HYDROCOD POLI-CHLORPHE POLI ER 10-8 MG/5ML PO SUER
5.0000 mL | Freq: Once | ORAL | Status: AC
  Administered 2024-06-23: 5 mL via ORAL

## 2024-06-23 MED ORDER — MOLNUPIRAVIR EUA 200MG CAPSULE: 4.0000 | ORAL_CAPSULE | Freq: Two times a day (BID) | ORAL | 0 refills | Status: AC

## 2024-06-23 MED ORDER — SODIUM ZIRCONIUM CYCLOSILICATE 10 G PO PACK
10.0000 g | PACK | Freq: Once | ORAL | Status: AC
  Administered 2024-06-24: 10 g via ORAL
  Filled 2024-06-23: qty 1

## 2024-06-23 MED ORDER — BENZONATATE 200 MG PO CAPS: 200.0000 mg | ORAL_CAPSULE | Freq: Three times a day (TID) | ORAL | 0 refills | Status: AC | PRN

## 2024-06-23 MED ORDER — HYDROCOD POLI-CHLORPHE POLI ER 10-8 MG/5ML PO SUER
ORAL | Status: AC
  Filled 2024-06-23: qty 5

## 2024-06-23 MED ORDER — IPRATROPIUM-ALBUTEROL 0.5-2.5 (3) MG/3ML IN SOLN
RESPIRATORY_TRACT | Status: AC
  Filled 2024-06-23: qty 6

## 2024-06-23 MED ORDER — HYDROCOD POLI-CHLORPHE POLI ER 10-8 MG/5ML PO SUER: 5.0000 mL | Freq: Two times a day (BID) | ORAL | 0 refills | Status: DC | PRN

## 2024-06-23 MED ORDER — IPRATROPIUM-ALBUTEROL 0.5-2.5 (3) MG/3ML IN SOLN
6.0000 mL | Freq: Once | RESPIRATORY_TRACT | Status: AC
  Administered 2024-06-23: 6 mL via RESPIRATORY_TRACT

## 2024-06-23 MED ORDER — PREDNISONE 10 MG PO TABS: ORAL_TABLET | ORAL | 0 refills | Status: DC

## 2024-06-23 NOTE — Progress Notes (Signed)
 "  Subjective:    Patient ID: Carrie [name], female    DOB: 1950/08/05, 74 y.o.   MRN: [id number]  HPI  Wt Readings from Last 3 Encounters:  06/23/24 219 lb (99.3 kg)  06/16/24 212 lb 9.6 oz (96.4 kg)  05/26/24 210 lb (95.3 kg)   40.06 kg/m  Vitals:   06/23/24 1548  BP: (!) 148/68  Pulse: 76  Temp: 98.5 F (36.9 C)  SpO2: 94%     74 yo pt of Dr Bennett presents with  Duana symptoms  Positive covid test here   Sunday am woke up with symptoms  Cough - got better then worse again  Prod of clear mucous  Some wheezing  Some congestion  No fever  Head hurts some when she coughs -top of head     Pmhx notable for  CHF ESRD on hemodialysis  Morbid obesity   Uses wixela inhaler - just recent dx with asthma    Over the counter Cough drops  Has to watch water intake    Results for orders placed or performed in visit on 06/23/24  POC COVID-19   Collection Time: 06/23/24  4:00 PM  Result Value Ref Range   SARS Coronavirus 2 Ag Positive (A) Negative     Patient Active Problem List   Diagnosis Date Noted   COVID-19 06/23/2024   Vertigo 04/28/2024   Anxiety 04/28/2024   On supplemental oxygen therapy 04/28/2024   Productive cough 08/08/2023   Hypokalemia 12/29/2022   Status post total left knee replacement 03/13/2022   Chronic diastolic heart failure (HCC) 05/17/2021   Seizure disorder (HCC)    Hyperlipidemia    Anemia 05/01/2021   Polyp of ascending colon    Polyp of descending colon    Polyp of transverse colon    Dysphagia 02/07/2021   Tremor 12/01/2020   Headache 12/01/2020   History of shingles 09/26/2020   Primary osteoarthritis of left knee 08/22/2020   Complication from renal dialysis device 11/20/2018   ESRD on hemodialysis (HCC) 08/04/2018   Status post total right knee replacement 04/30/2017   Status post total replacement of left hip 03/27/2016   Osteoarthritis of right knee 02/03/2014   Advanced directives, counseling/discussion 02/03/2014    Morbid obesity (HCC) 07/08/2012   HYPERCHOLESTEROLEMIA 09/26/2006   Essential hypertension 09/26/2006   Allergic rhinitis 09/26/2006   History of meningioma 08/03/2006   Past Medical History:  Diagnosis Date   Allergy    Anemia    Anxiety    Arthritis    right knee (10/26'/2017)   Chronic heart failure with preserved ejection fraction (HFpEF) (HCC)    a. 04/2021 Echo: EF >55%, GrI DD, nl RV fxn, triv MR; b. 12/2021 Echo: EF 60-65%, no rwma.   Demand ischemia (HCC)    a. 04/2021 HsTrop up to 73 in setting of volume overload; b. 10/2021 MV: EF 66% with small, mild reversible defect in the apical septal and mid anteroseptal myocardium which was felt to most likely represent artifact.   Depression    ESRD (end stage renal disease) (HCC)    a. OnHD (Dr. Woodward Brought)   GERD (gastroesophageal reflux disease)    History of blood transfusion 2008   when I had brain tumor surgery   History of MRSA infection 2008   History of stress test    a. 07/2018 MV: EF 66%, no ischemia/infarct.   Hypertension    Meningioma (HCC)    Foramen magnum   Oxygen deficiency  Pericardial effusion    a. 12/2021 Echo: EF 60-65%, no rwma, mild LVH, nl RV fxn, small pericardial effusion - small pocket of post wall, ~ 1cm.   Precordial chest pain    a. 10/2021 MV: low risk.   Type II diabetes mellitus (HCC) 2011   on metformin  until yr ago- no med now - off due to kidneys   Past Surgical History:  Procedure Laterality Date   A/V FISTULAGRAM Left 03/10/2019   Procedure: A/V FISTULAGRAM;  Surgeon: Jama Cordella MATSU, MD;  Location: [facility];  Service: Cardiovascular;  Laterality: Left;   A/V FISTULAGRAM Left 07/14/2019   Procedure: A/V FISTULAGRAM;  Surgeon: Jama Cordella MATSU, MD;  Location: [facility];  Service: Cardiovascular;  Laterality: Left;   A/V FISTULAGRAM Left 01/07/2020   Procedure: A/V FISTULAGRAM;  Surgeon: Jama Cordella MATSU, MD;  Location: [facility];   Service: Cardiovascular;  Laterality: Left;   A/V FISTULAGRAM Left 11/14/2021   Procedure: A/V Fistulagram;  Surgeon: Jama Cordella MATSU, MD;  Location: [facility];  Service: Cardiovascular;  Laterality: Left;   A/V SHUNTOGRAM Left 10/17/2023   Procedure: A/V SHUNTOGRAM;  Surgeon: Marea Selinda RAMAN, MD;  Location: [facility];  Service: Cardiovascular;  Laterality: Left;   AV FISTULA PLACEMENT Left 11/07/2018   Procedure: ARTERIOVENOUS (AV) FISTULA CREATION ( BRACHIO BASILIC ) VS BRACHIAL AXILLARY GRAFT;  Surgeon: Jama Cordella MATSU, MD;  Location: [facility];  Service: Vascular;  Laterality: Left;   BRAIN MENINGIOMA EXCISION  08/2006   Foramina magnum meningioma   CERVICAL DISCECTOMY  1996   Dr. Yves   CESAREAN SECTION  1981   COLONOSCOPY     COLONOSCOPY WITH PROPOFOL  N/A 03/07/2021   Procedure: COLONOSCOPY WITH PROPOFOL ;  Surgeon: Janalyn Keene NOVAK, MD;  Location: [facility];  Service: Endoscopy;  Laterality: N/A;   DIALYSIS/PERMA CATHETER INSERTION N/A 08/04/2018   Procedure: DIALYSIS/PERMA CATHETER INSERTION;  Surgeon: Marea Selinda RAMAN, MD;  Location: [facility];  Service: Cardiovascular;  Laterality: N/A;   DIALYSIS/PERMA CATHETER REMOVAL N/A 12/25/2018   Procedure: DIALYSIS/PERMA CATHETER REMOVAL;  Surgeon: Marea Selinda RAMAN, MD;  Location: [facility];  Service: Cardiovascular;  Laterality: N/A;   DILATION AND CURETTAGE OF UTERUS     JOINT REPLACEMENT     right knee left hip   MULTIPLE TOOTH EXTRACTIONS  01/30/2023   Right wrist x-ray  2007   Deg. at 1st MCP   TOTAL HIP ARTHROPLASTY Left 03/27/2016   Procedure: LEFT TOTAL HIP ARTHROPLASTY ANTERIOR APPROACH;  Surgeon: Lonni CINDERELLA Poli, MD;  Location: [facility];  Service: Orthopedics;  Laterality: Left;   TOTAL KNEE ARTHROPLASTY Right 04/30/2017   Procedure: RIGHT TOTAL KNEE ARTHROPLASTY;  Surgeon: Poli Lonni CINDERELLA, MD;  Location: [facility];  Service: Orthopedics;  Laterality: Right;   TOTAL KNEE  ARTHROPLASTY Left 03/13/2022   Procedure: LEFT TOTAL KNEE ARTHROPLASTY;  Surgeon: Poli Lonni CINDERELLA, MD;  Location: [facility];  Service: Orthopedics;  Laterality: Left;   TUBAL LIGATION  1981   Social History[1] Family History  Problem Relation Age of Onset   Hypertension Other        Strong family history    Breast cancer Mother 19   Heart failure Sister    Heart disease Sister    Allergies[2] Medications Ordered Prior to Encounter[3]  Review of Systems  Constitutional:  Positive for appetite change and fatigue. Negative for fever.  HENT:  Positive for congestion, postnasal drip, rhinorrhea, sinus  pressure, sneezing and sore throat. Negative for ear pain.   Eyes:  Negative for pain and discharge.  Respiratory:  Positive for cough. Negative for shortness of breath, wheezing and stridor.   Cardiovascular:  Negative for chest pain.  Gastrointestinal:  Negative for diarrhea, nausea and vomiting.  Genitourinary:  Negative for frequency, hematuria and urgency.  Musculoskeletal:  Negative for arthralgias and myalgias.  Skin:  Negative for rash.  Neurological:  Positive for headaches. Negative for dizziness, weakness and light-headedness.  Psychiatric/Behavioral:  Negative for confusion and dysphoric mood.        Objective:   Physical Exam Constitutional:      General: She is not in acute distress.    Appearance: Normal appearance. She is well-developed. She is obese. She is not ill-appearing, toxic-appearing or diaphoretic.  HENT:     Head: Normocephalic and atraumatic.     Comments: Nares are injected and congested      Right Ear: Tympanic membrane, ear canal and external ear normal.     Left Ear: Tympanic membrane, ear canal and external ear normal.     Nose: Congestion and rhinorrhea present.     Mouth/Throat:     Mouth: Mucous membranes are moist.     Pharynx: Oropharynx is clear. No oropharyngeal exudate or posterior oropharyngeal erythema.     Comments: Clear pnd   Eyes:     General:        Right eye: No discharge.        Left eye: No discharge.     Conjunctiva/sclera: Conjunctivae normal.     Pupils: Pupils are equal, round, and reactive to light.  Cardiovascular:     Rate and Rhythm: Normal rate.     Heart sounds: Normal heart sounds.  Pulmonary:     Effort: Pulmonary effort is normal. No respiratory distress.     Breath sounds: No stridor. Wheezing present. No rhonchi or rales.     Comments: Mild wheeze with forced expiration  No rales or crackles  Few scattered rhonchi  Wearing 02 via [place] Not shortness of breath with speech Chest:     Chest wall: No tenderness.  Musculoskeletal:     Cervical back: Normal range of motion and neck supple.  Lymphadenopathy:     Cervical: No cervical adenopathy.  Skin:    General: Skin is warm and dry.     Capillary Refill: Capillary refill takes less than 2 seconds.     Findings: No rash.  Neurological:     Mental Status: She is alert.     Cranial Nerves: No cranial nerve deficit.  Psychiatric:        Mood and Affect: Mood normal.           Assessment & Plan:   Problem List Items Addressed This Visit       Other   COVID-19 - Primary   Day 3 Cough and fatigue Some intermittent wheezing (in setting of asthma)  In pt with ESRD on dialysis  Reassuring exam  Discussed anti viral treatment to help prevent hospitalization  Molnupiravir  sent to pharmacy  Also prednisone  taper 30 mg  Also tessalon  pearles for cough   Discussed goal for isolation (will have dialysis-pt will let them know about her status)  Quarantine and masking discussed   Update if not starting to improve in a week or if worsening  Call back and Er precautions noted in detail today           Relevant Medications  molnupiravir  EUA (LAGEVRIO ) 200 mg CAPS capsule   Other Visit Diagnoses       Nasal congestion       Relevant Orders   POC COVID-19 (Completed)         [1]  Social History Tobacco Use    Smoking status: Never    Passive exposure: Past   Smokeless tobacco: Never  Vaping Use   Vaping status: Never Used  Substance Use Topics   Alcohol use: No    Alcohol/week: 0.0 standard drinks of alcohol   Drug use: No  [2]  Allergies Allergen Reactions   Naproxen Sodium Anaphylaxis and Swelling   Sulfamethoxazole-Trimethoprim Anaphylaxis and Swelling  [3]  Current Outpatient Medications on File Prior to Visit  Medication Sig Dispense Refill   acetaminophen  (TYLENOL ) 500 MG tablet Take 1,000 mg by mouth every 6 (six) hours as needed for mild pain or moderate pain.     amLODipine  (NORVASC ) 10 MG tablet Take 10 mg by mouth daily.     atorvastatin  (LIPITOR) 40 MG tablet TAKE 40 MG BY MOUTH ONCE DAILY 90 tablet 3   fluticasone -salmeterol (WIXELA INHUB) 100-50 MCG/ACT AEPB Inhale 1 puff into the lungs 2 (two) times daily. 60 each 12   hydrALAZINE  (APRESOLINE ) 25 MG tablet TAKE 1 TABLET BY MOUTH THREE TIMES A DAY (Patient taking differently: Take 25 mg by mouth 4 (four) times daily. Patient reports she is taking 2 in the AM and 2 in the PM) 270 tablet 1   hydrOXYzine  (ATARAX ) 25 MG tablet TAKE 0.5 TABLETS (12.5 MG TOTAL) BY MOUTH EVERY 8 (EIGHT) HOURS AS NEEDED FOR ANXIETY. 30 tablet 0   levETIRAcetam  (KEPPRA ) 500 MG tablet Take 1 tablet (500 mg total) by mouth 2 (two) times daily. 180 tablet 1   lidocaine -prilocaine  (EMLA ) cream Apply 1 application topically every Monday, Wednesday, and Friday with hemodialysis.     loratadine  (CLARITIN ) 10 MG tablet Take 1 tablet (10 mg total) by mouth daily. 90 tablet 0   meclizine  (ANTIVERT ) 25 MG tablet Take 1 tablet (25 mg total) by mouth every 8 (eight) hours as needed for dizziness. 270 tablet 0   metoprolol  succinate (TOPROL -XL) 25 MG 24 hr tablet Take 25 mg by mouth daily.     multivitamin (RENA-VIT) TABS tablet Take 1 tablet by mouth at bedtime. 30 tablet 0   omeprazole  (PRILOSEC) 20 MG capsule Take 1 capsule (20 mg total) by mouth in the morning and  at bedtime. 180 capsule 3   sevelamer  carbonate (RENVELA ) 800 MG tablet Take 800-1,600 mg by mouth See admin instructions. Take 2 tablets (1600 mg) by mouth three times daily with meals and take 1 tablet (800 mg) by mouth twice daily with snacks     torsemide  (DEMADEX ) 20 MG tablet Take 20 mg by mouth daily. Every day (including on dialysis days)     No current facility-administered medications on file prior to visit.   "

## 2024-06-23 NOTE — Telephone Encounter (Signed)
 FYI Only or Action Required?: FYI only for provider: appointment scheduled on 06/23/24.  Patient was last seen in primary care on 04/28/2024 by [name] POUR, MD.  Called Nurse Triage reporting URI.  Symptoms began several days ago.  Interventions attempted: OTC medications: claritin , zurtec.  Symptoms are: gradually worsening.  Triage Disposition: See Today in Office (overriding See HCP Within 4 Hours (Or PCP Triage))  Patient/caregiver understands and will follow disposition?:   Reason for Disposition  Wheezing is present  Answer Assessment - Initial Assessment Questions Pt reports that her dialysis provider had her start taking claritin . She had cold symptoms since Sunday and a pharmacist had her start taking zyrtec. States she has O2 at home that she is using PRN and and wixela 1 puff BID. Missed dialysis yesterday. 1. ONSET: When did the cough begin?      Sunday 06/21/24 2. SEVERITY: How bad is the cough today?      Severe 3. SPUTUM: Describe the color of your sputum (e.g., none, dry cough; clear, white, yellow, green)     Clear 4. HEMOPTYSIS: Are you coughing up any blood? If Yes, ask: How much? (e.g., flecks, streaks, tablespoons, etc.)     Denies 5. DIFFICULTY BREATHING: Are you having difficulty breathing? If Yes, ask: How bad is it? (e.g., mild, moderate, severe)      Speaking in clear and full sentences, worse with exertion 6. FEVER: Do you have a fever? If Yes, ask: What is your temperature, how was it measured, and when did it start?     Denies 7. CARDIAC HISTORY: Do you have any history of heart disease? (e.g., heart attack, congestive heart failure)      CHF, HTN 8. LUNG HISTORY: Do you have any history of lung disease?  (e.g., pulmonary embolus, asthma, emphysema)     Asthma 10. OTHER SYMPTOMS: Do you have any other symptoms? (e.g., runny nose, wheezing, chest pain)       Runny nose, wheezine  Protocols used: Cough - Acute  Productive-A-AH Message from [facility] sent at 06/23/2024  8:21 AM EST  Reason for Triage: wheezing, congestion, keeping her up.. productive cough.. Dialysis adv her to take claritan, then pharmacist said to try zirtech.. nothing is helping

## 2024-06-23 NOTE — Telephone Encounter (Signed)
 Will see patient then Agree with ER and UC precautions

## 2024-06-23 NOTE — Patient Instructions (Signed)
 Rest as much as you can   Get fluids-but stay within advisement of dialysis   I sent an anti viral (molnupiravir ) to your pharmacy for covid to take as directed This should help decrease the symptoms   Also prednisone  taper to help with wheezing/chest tightness  You can try the tessalon  pearles for cough    If symptoms get severe or if you have trouble breathing-go to the ER  Update if not starting to improve in a week or if worsening    Isolate yourself as much as possible until feeling better  Continue masking for another 10 days

## 2024-06-23 NOTE — Assessment & Plan Note (Addendum)
 Day 3 Cough and fatigue Some intermittent wheezing (in setting of asthma)  In pt with ESRD on dialysis  Reassuring exam  Discussed anti viral treatment to help prevent hospitalization  Molnupiravir  sent to pharmacy  Also prednisone  taper 30 mg  Also tessalon  pearles for cough   Discussed goal for isolation (will have dialysis-pt will let them know about her status)  Quarantine and masking discussed   Update if not starting to improve in a week or if worsening  Call back and Er precautions noted in detail today

## 2024-06-23 NOTE — Discharge Instructions (Signed)
 Go to dialysis tomorrow.

## 2024-06-23 NOTE — ED Provider Notes (Signed)
 "  [facility] Provider Note    Event Date/Time   First MD Initiated Contact with Patient 06/23/24 2151     (approximate)   History   Shortness of Breath   HPI  Carrie [name] is a 74 y.o. female who presents to the ED for evaluation of Shortness of Breath   Reviewed PCP visit from earlier today.  Positive COVID test in the clinic, seen for URI symptoms.  Otherwise morbidly obese patient, history of asthma ESRD on hemodialysis MWF and CHF.  Patient presented to the ED due to 3 days of cough preventing her from effectively sleeping.  She did miss 1 session of dialysis yesterday but has otherwise been compliant and plans to go tomorrow.  She was initially just asking for medication help her sleep and refuses blood work but after I talk with her she is willing to undergo straight stick for screening labs and potassium while treating her symptoms.   Physical Exam   Triage Vital Signs: ED Triage Vitals  Encounter Vitals Group     BP 06/23/24 2137 (!) 159/68     Girls Systolic BP Percentile --      Girls Diastolic BP Percentile --      Boys Systolic BP Percentile --      Boys Diastolic BP Percentile --      Pulse Rate 06/23/24 2137 71     Resp 06/23/24 2137 19     Temp 06/23/24 2137 98.6 F (37 C)     Temp Source 06/23/24 2137 Oral     SpO2 06/23/24 2137 95 %     Weight 06/23/24 2136 213 lb (96.6 kg)     Height 06/23/24 2136 5' 2 (1.575 m)     Head Circumference --      Peak Flow --      Pain Score 06/23/24 2140 10     Pain Loc --      Pain Education --      Exclude from Growth Chart --     Most recent vital signs: Vitals:   06/23/24 2137  BP: (!) 159/68  Pulse: 71  Resp: 19  Temp: 98.6 F (37 C)  SpO2: 95%    General: Awake, no distress.  Well-appearing and conversational CV:  Good peripheral perfusion.  Resp:  Normal effort.  Prolonged expiratory phase and faint and scattered expiratory wheezes Abd:  No distention.  MSK:  No  deformity noted.  Neuro:  No focal deficits appreciated. Other:     ED Results / Procedures / Treatments   Labs (all labs ordered are listed, but only abnormal results are displayed) Labs Reviewed  BASIC METABOLIC PANEL WITH GFR - Abnormal; Notable for the following components:      Result Value   Potassium 5.3 (*)    Glucose, Bld 117 (*)    BUN 89 (*)    Creatinine, Ser 12.40 (*)    GFR, Estimated 3 (*)    All other components within normal limits  CBC - Abnormal; Notable for the following components:   RBC 2.92 (*)    Hemoglobin 9.1 (*)    HCT 28.1 (*)    RDW 16.4 (*)    Platelets 132 (*)    All other components within normal limits  TROPONIN T, HIGH SENSITIVITY - Abnormal; Notable for the following components:   Troponin T High Sensitivity 39 (*)    All other components within normal limits  TROPONIN T, HIGH SENSITIVITY  EKG Sinus rhythm with a rate of 72 bpm.  Normal axis and intervals.  No clear signs of acute ischemia.  RADIOLOGY 1 view CXR interpreted by me with cardiomegaly and mild pulmonary vascular congestion  Official radiology report(s): DG Chest 1 View Result Date: 06/23/2024 EXAM: 2 Views XRAY of the Chest 06/23/2024 10:03:00 PM COMPARISON: 02/18/2024 CLINICAL HISTORY: [id number] SOB (shortness of breath) [id number] sob FINDINGS: LUNGS AND PLEURA: Linear scarring or atelectasis in the lung bases. No overt edema. No pleural effusion. No pneumothorax. HEART AND MEDIASTINUM: Cardiomegaly. Aortic atherosclerosis. BONES AND SOFT TISSUES: No acute osseous abnormality. IMPRESSION: 1. Cardiomegaly without overt pulmonary edema. Electronically signed by: Franky Crease MD 06/23/2024 10:07 PM EST RP Workstation: [id number]    PROCEDURES and INTERVENTIONS:  .1-3 Lead EKG Interpretation  Performed by: Claudene Rover, MD Authorized by: Claudene Rover, MD     Interpretation: normal     ECG rate:  70   ECG rate assessment: normal     Rhythm: sinus rhythm     Ectopy: none      Conduction: normal     Medications  chlorpheniramine-HYDROcodone  (TUSSIONEX) 10-8 MG/5ML suspension (  Not Given 06/23/24 2244)  ipratropium-albuterol  (DUONEB) 0.5-2.5 (3) MG/3ML nebulizer solution (  Not Given 06/23/24 2243)  sodium zirconium cyclosilicate  (LOKELMA ) packet 10 g (has no administration in time range)  ipratropium-albuterol  (DUONEB) 0.5-2.5 (3) MG/3ML nebulizer solution 6 mL (6 mLs Nebulization Given 06/23/24 2224)  chlorpheniramine-HYDROcodone  (TUSSIONEX) 10-8 MG/5ML suspension 5 mL (5 mLs Oral Given 06/23/24 2224)     IMPRESSION / MDM / ASSESSMENT AND PLAN / ED COURSE  I reviewed the triage vital signs and the nursing notes.  Differential diagnosis includes, but is not limited to, symptomatic COVID, asthma exacerbation, pneumonia, volume overload, hyperkalemia, ACS  {Patient presents with symptoms of an acute illness or injury that is potentially life-threatening.  Dialysis patient presents with cough for the past few days, diagnosed with COVID-19 today.  Suspect a degree of asthma exacerbation superimposed on this contributing to her cough and poor sleep.  Will provide DuoNebs, Tussionex and check basic labs to ensure potassium is okay.  Suspect she will be suitable for outpatient management and getting dialyzed as an outpatient tomorrow pending these serum diagnostics.  Blood work with mild hyperkalemia, no EKG changes, mild elevation of troponin and chronic anemia.  I certainly considered admission for this patient but she is still eager to go home, has family support and dialysis arranged for first thing tomorrow morning.  Provided dose of Lokelma  and discussed close return precautions.  Clinical Course as of 06/23/24 2330  Tue Jun 23, 2024  2328 Reassessed, patient reports feeling better and is eager to go home.  We discussed her mildly elevated potassium.  Discussed dose of Lokelma  and she reassures me that she will be going to hemodialysis first thing in the morning.   Family is with her and in agreement. [DS]    Clinical Course User Index [DS] Claudene Rover, MD     FINAL CLINICAL IMPRESSION(S) / ED DIAGNOSES   Final diagnoses:  Acute cough  COVID-19  Hyperkalemia     Rx / DC Orders   ED Discharge Orders          Ordered    chlorpheniramine-HYDROcodone  (TUSSIONEX) 10-8 MG/5ML  Every 12 hours PRN        06/23/24 2329             Note:  This document was prepared using Dragon voice recognition software and may  include unintentional dictation errors.   Claudene Rover, MD 06/23/24 2330  "

## 2024-06-23 NOTE — ED Triage Notes (Addendum)
 Pt to triage via wheelchair. Dialysis pt.  Missed dialysis this week.    Pt ic Covid positive today. Pt has sob.  Pt reports coughing.  No chest pain.  Pt alert  speech clear.

## 2024-06-24 NOTE — Telephone Encounter (Signed)
 See pharmacy note:  Pharmacy comment: Alternative Requested:THE PRESCRIBED MEDICATION IS NOT COVERED BY INSURANCE. PLEASE CONSIDER CHANGING TO ONE OF THE SUGGESTED COVERED ALTERNATIVES.

## 2024-06-24 NOTE — Telephone Encounter (Signed)
 I sent molnupiravir  since she has end stage renal disease  Don't want to give her paxlovid   ? If the pharmacist has a coupon to help with cost?  Doubtful

## 2024-06-24 NOTE — Telephone Encounter (Signed)
 Called pharmacy and they advise me that even with the savings card it is still very expensive. Paxlovid Rx declined

## 2024-06-26 ENCOUNTER — Telehealth: Payer: Self-pay | Admitting: *Deleted

## 2024-06-26 MED ORDER — PREDNISONE 10 MG PO TABS: ORAL_TABLET | ORAL | 0 refills | Status: AC

## 2024-06-26 NOTE — Telephone Encounter (Signed)
-----   Message from Laine Balls, MD sent at 06/23/2024  6:08 PM EST ----- Anie Juniel-please call and check on this pt tomorrow to see how she is and if she got the molnupiravir   Dr Bennett- rick

## 2024-06-26 NOTE — Telephone Encounter (Signed)
 I sent the prednisone  for 40 mg taper (last was 30, so not that much more)   ER/UC precautions especially of wheeze/shortness of breath  Keep us  posted

## 2024-06-26 NOTE — Telephone Encounter (Signed)
 Pt said she wasn't able to get molnupiravir  due to cost and she did get the tessalon  pills but they didn't help at all so she ended up in the ER. They did give her tussionex that is helping her cough some but not much. Pt said she is still coughing really bad to the point her sides are starting to get sore from coughing. She is using her O2 and inhalers often due to wheezing.

## 2024-06-26 NOTE — Telephone Encounter (Signed)
 Thanks for letting me know  I reviewed the ER note  Tussionex is about the strongest thing for cough that I know of  We are also limited in light of dialysis /renal failure  Did the prednisone  help wheezing at all (initially)?   We could repeat the course/higher dose if needed

## 2024-06-26 NOTE — Telephone Encounter (Signed)
 Pt notified of Dr. Graham comments. Pt said she does want to try another round of prednisone , she doesn't like taking very high doses but she is willing to try another round. Pt has f/u with PCP next week and will keep appt. Pt advised if sxs worsen to let us  know  [facility] [address]

## 2024-06-30 ENCOUNTER — Encounter

## 2024-07-02 ENCOUNTER — Ambulatory Visit: Payer: Self-pay | Admitting: *Deleted

## 2024-07-02 NOTE — Telephone Encounter (Signed)
 FYI Only or Action Required?: FYI only for provider: appointment scheduled on 1/30.  Patient was last seen in primary care on 06/23/2024 by Randeen Laine LABOR, MD.  Called Nurse Triage reporting Cough.  Symptoms began several weeks ago.  Interventions attempted: Other: prenisone, albuterol , Benzonatate  .  Symptoms are: unchanged.  Triage Disposition: See Physician Within 24 Hours  Patient/caregiver understands and will follow disposition?: Yes   Message from [place] G sent at 07/02/2024 11:37 AM EST  Reason for Triage: Pt has been up all night coughing and when she woke up she could hardly catch her breath(shortness of breath). She is requesting cough syrup.   Reason for Disposition  SEVERE coughing spells (e.g., whooping sound after coughing, vomiting after coughing)  Answer Assessment - Initial Assessment Questions Patient is currently using prednisone , benzonatate  , albuterol  inhaler- patient is concerned that her cough is not going away- it is so violent she is having pain. Patient is using her supplemental O2- 2 liters all the time now- she has fluctuations of O2 sat- from 83-97%. Currently reading 96%, BP 150/70 P 84   1. ONSET: When did the cough begin?      Chronic cough for over 1 year, 1/20- with COVID 2. SEVERITY: How bad is the cough today?      Patient reports violent cough 3. SPUTUM: Describe the color of your sputum (e.g., none, dry cough; clear, white, yellow, green)     Clear mucus- can choke patient with the cough 4. HEMOPTYSIS: Are you coughing up any blood? If Yes, ask: How much? (e.g., flecks, streaks, tablespoons, etc.)     no 5. DIFFICULTY BREATHING: Are you having difficulty breathing? If Yes, ask: How bad is it? (e.g., mild, moderate, severe)      Patient has supplemental O2 as needed- she is using it all the time now- using 2 liters, patient gets choked coughing 6. FEVER: Do you have a fever? If Yes, ask: What is your temperature, how was  it measured, and when did it start?     no 7. CARDIAC HISTORY: Do you have any history of heart disease? (e.g., heart attack, congestive heart failure)      no 8. LUNG HISTORY: Do you have any history of lung disease?  (e.g., pulmonary embolus, asthma, emphysema)     Asthma 9. PE RISK FACTORS: Do you have a history of blood clots? (or: recent major surgery, recent prolonged travel, bedridden)     no 10. OTHER SYMPTOMS: Do you have any other symptoms? (e.g., runny nose, wheezing, chest pain)       no  Protocols used: Cough - Acute Productive-A-AH

## 2024-07-02 NOTE — Telephone Encounter (Signed)
 Noted, will evaluate.

## 2024-07-03 ENCOUNTER — Ambulatory Visit: Admitting: Primary Care

## 2024-07-03 VITALS — BP 132/80 | HR 83 | Temp 98.0°F | Ht 62.0 in | Wt 217.0 lb

## 2024-07-03 DIAGNOSIS — R053 Chronic cough: Secondary | ICD-10-CM

## 2024-07-03 MED ORDER — AMOXICILLIN-POT CLAVULANATE 875-125 MG PO TABS: 1.0000 | ORAL_TABLET | Freq: Two times a day (BID) | ORAL | 0 refills | Status: AC

## 2024-07-03 MED ORDER — HYDROCOD POLI-CHLORPHE POLI ER 10-8 MG/5ML PO SUER: 5.0000 mL | Freq: Two times a day (BID) | ORAL | 0 refills | Status: AC | PRN

## 2024-07-03 NOTE — Assessment & Plan Note (Addendum)
 Differentials include postinfectious cough, asthma, fluid volume overload, viral/bacterial involvement, silent reflux. Lower suspicion for fluid volume overload given examinationin the office and dialysis today.  Reviewed ED notes, labs, imaging  Given that she has failed prednisone , Wixela inhaler, Tessalon  Perles, coupled with her Covid-19 infection 10 days ago and worsening cough, will try antibacterial regimen.   Continue Wixela inhaler as prescribed. Continue omeprazole  20 mg in AM, increase to 40 mg in PM. Will add sputum culture per patient request  We will reach out to her pulmonologist for other ideas.

## 2024-07-08 ENCOUNTER — Ambulatory Visit: Payer: Self-pay | Admitting: Primary Care

## 2024-07-08 LAB — RESPIRATORY CULTURE OR RESPIRATORY AND SPUTUM CULTURE
MICRO NUMBER:: 17532730
RESULT:: NORMAL
SPECIMEN QUALITY:: ADEQUATE

## 2024-07-09 ENCOUNTER — Other Ambulatory Visit: Payer: Self-pay

## 2024-07-09 NOTE — Patient Outreach (Signed)
 Complex Care Management   Visit Note  07/09/2024  Name:  Carrie [name] MRN: [id number] DOB: [birth year]  Situation: Referral received for Complex Care Management related to Heart Failure and HTN I obtained verbal consent from Patient.  Visit completed with Patient  on the phone  Background:   Past Medical History:  Diagnosis Date   Allergy    Anemia    Anxiety    Arthritis    right knee (10/26'/2017)   Chronic heart failure with preserved ejection fraction (HFpEF) (HCC)    a. 04/2021 Echo: EF >55%, GrI DD, nl RV fxn, triv MR; b. 12/2021 Echo: EF 60-65%, no rwma.   Demand ischemia (HCC)    a. 04/2021 HsTrop up to 73 in setting of volume overload; b. 10/2021 MV: EF 66% with small, mild reversible defect in the apical septal and mid anteroseptal myocardium which was felt to most likely represent artifact.   Depression    ESRD (end stage renal disease) (HCC)    a. On[facility] (Dr. Woodward Brought)   GERD (gastroesophageal reflux disease)    History of blood transfusion 2008   when I had brain tumor surgery   History of MRSA infection 2008   History of stress test    a. 07/2018 MV: EF 66%, no ischemia/infarct.   Hypertension    Meningioma (HCC)    Foramen magnum   Oxygen deficiency    Pericardial effusion    a. 12/2021 Echo: EF 60-65%, no rwma, mild LVH, nl RV fxn, small pericardial effusion - small pocket of post wall, ~ 1cm.   Precordial chest pain    a. 10/2021 MV: low risk.   Type II diabetes mellitus (HCC) 2011   on metformin  until yr ago- no med now - off due to kidneys    Assessment: Patient Reported Symptoms:  Cognitive Cognitive Status: No symptoms reported Cognitive/Intellectual Conditions Management [RPT]: None reported or documented in medical history or problem list   Health Maintenance Behaviors: Annual physical exam Healing Pattern: Average Health Facilitated by: Prayer/meditation  Neurological Neurological Review of Symptoms: Dizziness Neurological Management  Strategies: Medication therapy Neurological Self-Management Outcome: 4 (good)  HEENT HEENT Symptoms Reported: No symptoms reported HEENT Management Strategies: Medication therapy, Routine screening HEENT Self-Management Outcome: 4 (good)    Cardiovascular Cardiovascular Symptoms Reported: No symptoms reported Does patient have uncontrolled Hypertension?: No Cardiovascular Management Strategies: Medication therapy, Routine screening Do You Have a Working Readable Scale?: Yes Weight: 208 lb 1.8 oz (94.4 kg) Cardiovascular Self-Management Outcome: 4 (good) Cardiovascular Comment: post 133/82 dialysis  Respiratory Respiratory Symptoms Reported: Productive cough, Wheezing Other Respiratory Symptoms: o2 2lnc cough improving started on new inhaler Respiratory Management Strategies: Medication therapy, Routine screening, Oxygen therapy Respiratory Self-Management Outcome: 4 (good)  Endocrine Endocrine Symptoms Reported: No symptoms reported Is patient diabetic?: No Endocrine Self-Management Outcome: 4 (good)  Gastrointestinal Gastrointestinal Symptoms Reported: No symptoms reported Gastrointestinal Management Strategies: Diet modification, Fluid modification Gastrointestinal Comment: 32 oz restriction Nutrition Risk Screen (CP): No indicators present  Genitourinary Genitourinary Symptoms Reported: No symptoms reported Additional Genitourinary Details: no changes Genitourinary Management Strategies: Hemodialysis Hemodialysis Schedule: [facility] Last Treatment: 07/08/24 Genitourinary Self-Management Outcome: 4 (good)  Integumentary Integumentary Symptoms Reported: No symptoms reported Skin Management Strategies: Routine screening Skin Self-Management Outcome: 4 (good) Skin Comment: graft lue  Musculoskeletal Musculoskelatal Symptoms Reviewed: No symptoms reported Musculoskeletal Management Strategies: Routine screening Musculoskeletal Self-Management Outcome: 4 (good) Falls  in the past year?: No Number of falls in past year: 1 or less Was there  an injury with Fall?: No Fall Risk Category Calculator: 0 Patient Fall Risk Level: Low Fall Risk Patient at Risk for Falls Due to: No Fall Risks Fall risk Follow up: Falls evaluation completed, Education provided, Falls prevention discussed  Psychosocial   Behavioral Management Strategies: Support system, Coping strategies Behavioral Health Self-Management Outcome: 4 (good) Major Change/Loss/Stressor/Fears (CP): Denies Techniques to Cope with Loss/Stress/Change: Diversional activities, Spiritual practice(s) Quality of Family Relationships: supportive, helpful, involved Do you feel physically threatened by others?: No    07/09/2024    PHQ2-9 Depression Screening   Little interest or pleasure in doing things Not at all  Feeling down, depressed, or hopeless Not at all  PHQ-2 - Total Score 0  Trouble falling or staying asleep, or sleeping too much Not at all  Feeling tired or having little energy Not at all  Poor appetite or overeating  Not at all  Feeling bad about yourself - or that you are a failure or have let yourself or your family down Not at all  Trouble concentrating on things, such as reading the newspaper or watching television Not at all  Moving or speaking so slowly that other people could have noticed.  Or the opposite - being so fidgety or restless that you have been moving around a lot more than usual Not at all  Thoughts that you would be better off dead, or hurting yourself in some way Not at all  PHQ2-9 Total Score 0  If you checked off any problems, how difficult have these problems made it for you to do your work, take care of things at home, or get along with other people Not difficult at all  Depression Interventions/Treatment      Today's Vitals   07/09/24 1007  Weight: 208 lb 1.8 oz (94.4 kg)   Pain Scale: 0-10 Pain Score: 0-No pain  Medications Reviewed Today     Reviewed by Nivia  Adayah Arocho , RN (Registered Nurse) on 07/09/24 at 1006  Med List Status: <None>   Medication Order Taking? Sig Documenting Provider Last Dose Status Informant  acetaminophen  (TYLENOL ) 500 MG tablet [id number] Yes Take 1,000 mg by mouth every 6 (six) hours as needed for mild pain or moderate pain. [provider]  Active Self  amLODipine  (NORVASC ) 10 MG tablet [id number] Yes Take 10 mg by mouth daily. [provider]  Active Self  amoxicillin -clavulanate (AUGMENTIN ) 875-125 MG tablet [id number] Yes Take 1 tablet by mouth 2 (two) times daily. Clark, Katherine K, NP  Active   atorvastatin  (LIPITOR) 40 MG tablet [id number] Yes TAKE 40 MG BY MOUTH ONCE DAILY Letvak, Richard I, MD  Active Self  benzonatate  (TESSALON ) 200 MG capsule [id number] Yes Take 1 capsule (200 mg total) by mouth 3 (three) times daily as needed for cough. Swallow whole Tower, Laine LABOR, MD  Active   chlorpheniramine-HYDROcodone  (TUSSIONEX) 10-8 MG/5ML [id number] Yes Take 5 mLs by mouth every 12 (twelve) hours as needed for cough. Gretta Comer POUR, NP  Active   fluticasone -salmeterol [facility] INHUB) 100-50 MCG/ACT AEPB [id number] Yes Inhale 1 puff into the lungs 2 (two) times daily. Isadora Hose, MD  Active   hydrALAZINE  (APRESOLINE ) 25 MG tablet [id number] Yes TAKE 1 TABLET BY MOUTH THREE TIMES A DAY  Patient taking differently: Take 25 mg by mouth 4 (four) times daily. Patient reports she is taking 2 in the AM and 2 in the PM   Letvak, Richard I, MD  Active   hydrOXYzine  (ATARAX ) 25 MG tablet [id number] Yes TAKE 0.5  TABLETS (12.5 MG TOTAL) BY MOUTH EVERY 8 (EIGHT) HOURS AS NEEDED FOR ANXIETY. Bowa, Nahosi K, MD  Active   levETIRAcetam  (KEPPRA ) 500 MG tablet [id number] Yes Take 1 tablet (500 mg total) by mouth 2 (two) times daily. Bowa, Nahosi K, MD  Active   lidocaine -prilocaine  (EMLA ) cream [id number] Yes Apply 1 application topically every Monday, Wednesday, and Friday with hemodialysis. [provider]  Active Self   loratadine  (CLARITIN ) 10 MG tablet [id number] Yes Take 1 tablet (10 mg total) by mouth daily. Bowa, Nahosi K, MD  Active   meclizine  (ANTIVERT ) 25 MG tablet [id number] Yes Take 1 tablet (25 mg total) by mouth every 8 (eight) hours as needed for dizziness. Bowa, Nahosi K, MD  Active   metoprolol  succinate (TOPROL -XL) 25 MG 24 hr tablet [id number] Yes Take 25 mg by mouth daily. [provider]  Active   multivitamin (RENA-VIT) TABS tablet [id number] Yes Take 1 tablet by mouth at bedtime. Josette Ade, MD  Active Self  omeprazole  (PRILOSEC) 20 MG capsule [id number] Yes Take 1 capsule (20 mg total) by mouth in the morning and at bedtime. Jimmy Ade FERNS, MD  Active   predniSONE  (DELTASONE ) 10 MG tablet [id number] Yes Take 4 pills once daily by mouth for 3 days, then 3 pills daily for 3 days, then 2 pills daily for 3 days then 1 pill daily for 3 days then stop Tower, Laine LABOR, MD  Active   sevelamer  carbonate (RENVELA ) 800 MG tablet [id number] Yes Take 800-1,600 mg by mouth See admin instructions. Take 2 tablets (1600 mg) by mouth three times daily with meals and take 1 tablet (800 mg) by mouth twice daily with snacks [provider]  Active Self  torsemide  (DEMADEX ) 20 MG tablet [id number] Yes Take 20 mg by mouth daily. Every day (including on dialysis days) [provider]  Active Self            Recommendation:   Continue Current Plan of Care  Follow Up Plan:   Telephone follow-up in 1 month Patient will f/u with pulmonary for persistent cough  Abigael Mogle RN RN Care Manager [facility] [phone]

## 2024-07-09 NOTE — Patient Instructions (Signed)
 Visit Information  Thank you for taking time to visit with me today. Please don't hesitate to contact me if I can be of assistance to you before our next scheduled appointment.  Your next care management appointment is by telephone on 08/06/24 at 10:00am  Telephone follow-up in 1 month  Please call the care guide team at [phone] if you need to cancel, schedule, or reschedule an appointment.   Please call the Suicide and Crisis Lifeline: 988 call the [place]  National Suicide Prevention Lifeline: 660 718 4973 or TTY: 201-564-6535 TTY 304-109-3600) to talk to a trained counselor call 1-800-273-TALK (toll free, 24 hour hotline) if you are experiencing a Mental Health or Behavioral Health Crisis or need someone to talk to.  Carrie Vantassell RN RN Care Manager [facility] [phone]

## 2024-07-20 ENCOUNTER — Ambulatory Visit: Admitting: Student in an Organized Health Care Education/Training Program

## 2024-08-06 ENCOUNTER — Telehealth

## 2024-08-11 ENCOUNTER — Encounter

## 2024-08-20 ENCOUNTER — Ambulatory Visit: Admitting: Student

## 2024-10-06 ENCOUNTER — Ambulatory Visit: Admitting: Student in an Organized Health Care Education/Training Program

## 2024-11-24 ENCOUNTER — Ambulatory Visit (INDEPENDENT_AMBULATORY_CARE_PROVIDER_SITE_OTHER): Admitting: Nurse Practitioner

## 2024-11-24 ENCOUNTER — Encounter (INDEPENDENT_AMBULATORY_CARE_PROVIDER_SITE_OTHER)

## 2025-01-26 ENCOUNTER — Ambulatory Visit
